# Patient Record
Sex: Female | Born: 1980 | Race: Black or African American | Hispanic: No | Smoking: Never smoker
Health system: Southern US, Community
[De-identification: ages and names within clinical notes are randomized; demographics above are authoritative.]

## PROBLEM LIST (undated history)

## (undated) DIAGNOSIS — N289 Disorder of kidney and ureter, unspecified: Secondary | ICD-10-CM

## (undated) DIAGNOSIS — E559 Vitamin D deficiency, unspecified: Secondary | ICD-10-CM

## (undated) DIAGNOSIS — I10 Essential (primary) hypertension: Secondary | ICD-10-CM

## (undated) DIAGNOSIS — I639 Cerebral infarction, unspecified: Secondary | ICD-10-CM

## (undated) DIAGNOSIS — E119 Type 2 diabetes mellitus without complications: Secondary | ICD-10-CM

## (undated) DIAGNOSIS — E785 Hyperlipidemia, unspecified: Secondary | ICD-10-CM

## (undated) HISTORY — PX: OTHER SURGICAL HISTORY: SHX169

## (undated) HISTORY — PX: COLON SURGERY: SHX602

## (undated) HISTORY — PX: TUBAL LIGATION: SHX77

## (undated) HISTORY — PX: COLOSTOMY: SHX63

---

## 2019-02-07 ENCOUNTER — Inpatient Hospital Stay
Admission: AD | Admit: 2019-02-07 | Discharge: 2019-05-03 | Disposition: A | Discharge status: Skilled Nursing Facility | Payer: Self-pay | Source: Other Acute Inpatient Hospital | Attending: Internal Medicine | Admitting: Internal Medicine

## 2019-02-07 DIAGNOSIS — Z452 Encounter for adjustment and management of vascular access device: Secondary | ICD-10-CM

## 2019-02-07 DIAGNOSIS — J189 Pneumonia, unspecified organism: Secondary | ICD-10-CM

## 2019-02-07 DIAGNOSIS — R109 Unspecified abdominal pain: Secondary | ICD-10-CM

## 2019-02-08 LAB — CBC WITH DIFFERENTIAL/PLATELET
Abs Immature Granulocytes: 0.05 10*3/uL (ref 0.00–0.07)
Basophils Absolute: 0.2 10*3/uL — ABNORMAL HIGH (ref 0.0–0.1)
Basophils Relative: 1 %
Eosinophils Absolute: 1.1 10*3/uL — ABNORMAL HIGH (ref 0.0–0.5)
Eosinophils Relative: 8 %
HCT: 30 % — ABNORMAL LOW (ref 36.0–46.0)
Hemoglobin: 10 g/dL — ABNORMAL LOW (ref 12.0–15.0)
Immature Granulocytes: 0 %
Lymphocytes Relative: 19 %
Lymphs Abs: 2.5 10*3/uL (ref 0.7–4.0)
MCH: 30.1 pg (ref 26.0–34.0)
MCHC: 33.3 g/dL (ref 30.0–36.0)
MCV: 90.4 fL (ref 80.0–100.0)
Monocytes Absolute: 0.7 10*3/uL (ref 0.1–1.0)
Monocytes Relative: 6 %
Neutro Abs: 8.4 10*3/uL — ABNORMAL HIGH (ref 1.7–7.7)
Neutrophils Relative %: 66 %
Platelets: 386 10*3/uL (ref 150–400)
RBC: 3.32 MIL/uL — ABNORMAL LOW (ref 3.87–5.11)
RDW: 18 % — ABNORMAL HIGH (ref 11.5–15.5)
WBC: 12.9 10*3/uL — ABNORMAL HIGH (ref 4.0–10.5)
nRBC: 0 % (ref 0.0–0.2)

## 2019-02-08 LAB — HEMOGLOBIN A1C
Hgb A1c MFr Bld: 6.2 % — ABNORMAL HIGH (ref 4.8–5.6)
Mean Plasma Glucose: 131.24 mg/dL

## 2019-02-08 LAB — COMPREHENSIVE METABOLIC PANEL
ALT: 16 U/L (ref 0–44)
AST: 33 U/L (ref 15–41)
Albumin: 2 g/dL — ABNORMAL LOW (ref 3.5–5.0)
Alkaline Phosphatase: 119 U/L (ref 38–126)
Anion gap: 12 (ref 5–15)
BUN: 36 mg/dL — ABNORMAL HIGH (ref 6–20)
CO2: 22 mmol/L (ref 22–32)
Calcium: 9.2 mg/dL (ref 8.9–10.3)
Chloride: 98 mmol/L (ref 98–111)
Creatinine, Ser: 4.11 mg/dL — ABNORMAL HIGH (ref 0.44–1.00)
GFR calc Af Amer: 15 mL/min — ABNORMAL LOW (ref >60)
GFR calc non Af Amer: 13 mL/min — ABNORMAL LOW (ref >60)
Glucose, Bld: 206 mg/dL — ABNORMAL HIGH (ref 70–99)
Potassium: 3.5 mmol/L (ref 3.5–5.1)
Sodium: 132 mmol/L — ABNORMAL LOW (ref 135–145)
Total Bilirubin: 0.5 mg/dL (ref 0.3–1.2)
Total Protein: 5.7 g/dL — ABNORMAL LOW (ref 6.5–8.1)

## 2019-02-08 MED ORDER — MELATONIN 3 MG PO TABS
3.00 | ORAL_TABLET | ORAL | Status: DC
Start: 2019-02-07 — End: 2019-02-08

## 2019-02-08 MED ORDER — OXYCODONE-ACETAMINOPHEN 5-325 MG PO TABS
1.00 | ORAL_TABLET | ORAL | Status: DC
Start: ? — End: 2019-02-08

## 2019-02-08 MED ORDER — LIDOCAINE 5 % EX PTCH
1.00 | MEDICATED_PATCH | CUTANEOUS | Status: DC
Start: 2019-02-07 — End: 2019-02-08

## 2019-02-08 MED ORDER — ACETAMINOPHEN 325 MG PO TABS
975.00 | ORAL_TABLET | ORAL | Status: DC
Start: 2019-02-07 — End: 2019-02-08

## 2019-02-08 MED ORDER — INSULIN LISPRO 100 UNIT/ML ~~LOC~~ SOLN
0.00 | SUBCUTANEOUS | Status: DC
Start: 2019-02-07 — End: 2019-02-08

## 2019-02-08 MED ORDER — HYDRALAZINE HCL 25 MG PO TABS
25.00 | ORAL_TABLET | ORAL | Status: DC
Start: ? — End: 2019-02-08

## 2019-02-08 MED ORDER — GLUCOSE 40 % PO GEL
ORAL | Status: DC
Start: ? — End: 2019-02-08

## 2019-02-08 MED ORDER — SENNOSIDES-DOCUSATE SODIUM 8.6-50 MG PO TABS
2.00 | ORAL_TABLET | ORAL | Status: DC
Start: 2019-02-08 — End: 2019-02-08

## 2019-02-08 MED ORDER — GENERIC EXTERNAL MEDICATION
1.00 | Status: DC
Start: 2019-02-08 — End: 2019-02-08

## 2019-02-08 MED ORDER — POLYETHYLENE GLYCOL 3350 17 G PO PACK
17.00 | PACK | ORAL | Status: DC
Start: 2019-02-08 — End: 2019-02-08

## 2019-02-08 MED ORDER — ASPIRIN EC 81 MG PO TBEC
81.00 | DELAYED_RELEASE_TABLET | ORAL | Status: DC
Start: 2019-02-08 — End: 2019-02-08

## 2019-02-08 MED ORDER — ATORVASTATIN CALCIUM 80 MG PO TABS
80.00 | ORAL_TABLET | ORAL | Status: DC
Start: 2019-02-07 — End: 2019-02-08

## 2019-02-08 MED ORDER — OXYCODONE-ACETAMINOPHEN 10-325 MG PO TABS
2.00 | ORAL_TABLET | ORAL | Status: DC
Start: ? — End: 2019-02-08

## 2019-02-08 MED ORDER — CALCIUM CARBONATE ANTACID 500 MG PO CHEW
2.00 | CHEWABLE_TABLET | ORAL | Status: DC
Start: 2019-02-07 — End: 2019-02-08

## 2019-02-08 MED ORDER — TRAMADOL HCL 50 MG PO TABS
50.00 | ORAL_TABLET | ORAL | Status: DC
Start: 2019-02-07 — End: 2019-02-08

## 2019-02-08 MED ORDER — GENERIC EXTERNAL MEDICATION
500.00 | Status: DC
Start: 2019-02-08 — End: 2019-02-08

## 2019-02-08 MED ORDER — NITROGLYCERIN 0.4 MG SL SUBL
0.40 | SUBLINGUAL_TABLET | SUBLINGUAL | Status: DC
Start: ? — End: 2019-02-08

## 2019-02-08 MED ORDER — DEXTROSE 50 % IV SOLN
50.00 | INTRAVENOUS | Status: DC
Start: ? — End: 2019-02-08

## 2019-02-08 MED ORDER — POLYVINYL ALCOHOL-POVIDONE PF 1.4-0.6 % OP SOLN
1.00 | OPHTHALMIC | Status: DC
Start: ? — End: 2019-02-08

## 2019-02-08 MED ORDER — HYDROMORPHONE HCL 1 MG/ML IJ SOLN
1.00 | INTRAMUSCULAR | Status: DC
Start: ? — End: 2019-02-08

## 2019-02-08 MED ORDER — GLUCAGON HCL (DIAGNOSTIC) 1 MG IJ SOLR
1.00 | INTRAMUSCULAR | Status: DC
Start: ? — End: 2019-02-08

## 2019-02-08 MED ORDER — FLUTICASONE PROPIONATE 50 MCG/ACT NA SUSP
2.00 | NASAL | Status: DC
Start: ? — End: 2019-02-08

## 2019-02-08 MED ORDER — ONDANSETRON HCL 4 MG/2ML IJ SOLN
4.00 | INTRAMUSCULAR | Status: DC
Start: ? — End: 2019-02-08

## 2019-02-08 MED ORDER — PROSOURCE NO CARB PO LIQD
30.00 | ORAL | Status: DC
Start: 2019-02-08 — End: 2019-02-08

## 2019-02-08 MED ORDER — SACCHAROMYCES BOULARDII 250 MG PO CAPS
250.00 | ORAL_CAPSULE | ORAL | Status: DC
Start: 2019-02-07 — End: 2019-02-08

## 2019-02-08 MED ORDER — EPOETIN ALFA-EPBX 4000 UNIT/ML IJ SOLN
4000.00 | INTRAMUSCULAR | Status: DC
Start: ? — End: 2019-02-08

## 2019-02-08 MED ORDER — INSULIN GLARGINE 100 UNIT/ML ~~LOC~~ SOLN
10.00 | SUBCUTANEOUS | Status: DC
Start: 2019-02-08 — End: 2019-02-08

## 2019-02-08 MED ORDER — HEPARIN SODIUM (PORCINE) PF 5000 UNIT/0.5ML IJ SOLN
5000.00 | INTRAMUSCULAR | Status: DC
Start: 2019-02-07 — End: 2019-02-08

## 2019-02-08 MED ORDER — MELATONIN 3 MG PO TABS
6.00 | ORAL_TABLET | ORAL | Status: DC
Start: ? — End: 2019-02-08

## 2019-02-09 LAB — RENAL FUNCTION PANEL
Albumin: 1.9 g/dL — ABNORMAL LOW (ref 3.5–5.0)
Anion gap: 11 (ref 5–15)
BUN: 51 mg/dL — ABNORMAL HIGH (ref 6–20)
CO2: 24 mmol/L (ref 22–32)
Calcium: 9.7 mg/dL (ref 8.9–10.3)
Chloride: 98 mmol/L (ref 98–111)
Creatinine, Ser: 4.41 mg/dL — ABNORMAL HIGH (ref 0.44–1.00)
GFR calc Af Amer: 14 mL/min — ABNORMAL LOW (ref >60)
GFR calc non Af Amer: 12 mL/min — ABNORMAL LOW (ref >60)
Glucose, Bld: 165 mg/dL — ABNORMAL HIGH (ref 70–99)
Phosphorus: 3.4 mg/dL (ref 2.5–4.6)
Potassium: 3.9 mmol/L (ref 3.5–5.1)
Sodium: 133 mmol/L — ABNORMAL LOW (ref 135–145)

## 2019-02-09 LAB — CBC
HCT: 29 % — ABNORMAL LOW (ref 36.0–46.0)
Hemoglobin: 9.3 g/dL — ABNORMAL LOW (ref 12.0–15.0)
MCH: 29.2 pg (ref 26.0–34.0)
MCHC: 32.1 g/dL (ref 30.0–36.0)
MCV: 91.2 fL (ref 80.0–100.0)
Platelets: 396 10*3/uL (ref 150–400)
RBC: 3.18 MIL/uL — ABNORMAL LOW (ref 3.87–5.11)
RDW: 18.3 % — ABNORMAL HIGH (ref 11.5–15.5)
WBC: 10 10*3/uL (ref 4.0–10.5)
nRBC: 0 % (ref 0.0–0.2)

## 2019-02-09 LAB — HEPATITIS B CORE ANTIBODY, IGM: Hep B C IgM: NONREACTIVE

## 2019-02-09 LAB — HEPATITIS B SURFACE ANTIGEN: Hepatitis B Surface Ag: NONREACTIVE

## 2019-02-09 NOTE — Consult Note (Signed)
[facility] CONSULT NOTE    Date: 02/09/2019                  Patient Name:  [name] Barnett  MRN: [id number]  DOB: May 19, 1980  Age / Sex: 38 y.o., female         PCP: Julianne Handler, DO                 Service Requesting Consult:  Hospitalist                 Reason for Consult:  Evaluation management of ESRD on HD            History of Present Illness: Patient is a 38 y.o. 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  Patient recently diagnosed with L4-5 spine discitis.  Patient also had a pressure ulcer as above and is status post debridement.  Patient to be continued on meropenem  Patient has been undergoing dialysis since November of last year.  She does have a right upper extremity hemodialysis access.  Patient has associated anemia of chronic kidney disease with hemoglobin of 10.0.  Patient also has history of secondary hyperparathyroidism but recently has had hypophosphatemia.  Medications:  Current medications: Meropenem 500 mg IV daily, aspirin 81 mg daily, atorvastatin 80 mg nightly, calcium carbonate 1 g 3 times daily, heparin 5000 units twice daily, Lantus insulin 10 units daily, Lidoderm patch daily, melatonin 3 mg nightly, MiraLAX 17 g daily, renal vitamin 1 tablet daily, senna 2 tablets daily, tramadol 50 mg twice daily  Allergies: Lisinopril, morphine, corn   Past Medical History: ESRD on HD in [place], chronic diastolic heart failure, coronary artery disease status post PCI, diabetes mellitus type 2, GERD, hypertension, history of CVA June 2020  with hemiparesis, expressive aphasia, recent necrotizing fasciitis with pressure ulcer, lumbar discitis, anemia of chronic kidney disease, secondary hyperparathyroidism, history of thyroid nodule, scoliosis  Past Surgical History: Pressure ulcer debridement Right upper extremity hemodialysis access placement CABG Tubal ligation Diverting colostomy  Family History: Father with history of CVA, mother with history of heart disease and heart failure, sister with family history of diabetes  Social History: No tobacco, alcohol, or illicit drug use.  Review of Systems: Review of Systems  Constitutional: Positive for malaise/fatigue. Negative for fever.  HENT: Negative for hearing loss and tinnitus.   Eyes: Negative for blurred vision and double vision.  Respiratory: Negative for cough, sputum production and shortness of breath.   Cardiovascular: Negative for chest pain, palpitations and orthopnea.  Gastrointestinal: Negative for heartburn, nausea and vomiting.  Genitourinary: Negative for dysuria and urgency.  Musculoskeletal: Positive for back pain.  Skin: Negative for itching and rash.  Neurological: Positive for focal weakness. Negative for dizziness.  Endo/Heme/Allergies: Negative for polydipsia. Does not bruise/bleed easily.  Psychiatric/Behavioral: Negative for depression. The patient is not nervous/anxious.     Vital Signs: Temperature 97.1 pulse 96 respiration 16 blood 127/55 Weight trends: There were no vitals filed for this visit.  Physical Exam: General: NAD, resting in bed  Head: Normocephalic, atraumatic.  Eyes: Anicteric, EOMI  Nose: Mucous membranes moist, not inflammed, nonerythematous.  Throat: Oropharynx nonerythematous, no  exudate appreciated.   Neck: Supple, trachea midline.  Lungs:  Normal respiratory effort. Clear to auscultation BL without crackles or wheezes.  Heart: RRR. S1 and S2 no rubs  Abdomen:  BS present, soft, NTND, ostomy present  Extremities:  No pretibial edema.  Neurologic: Awake, alert, conversant, some dysarthria noted  Skin: No rashes noted    Lab results: Basic Metabolic Panel: Recent Labs  Lab 02/08/19 1225  NA 132*  K 3.5  CL 98  CO2 22  GLUCOSE 206*  BUN 36*  CREATININE 4.11*  CALCIUM 9.2    Liver Function Tests: Recent Labs  Lab 02/08/19 1225  AST 33  ALT 16  ALKPHOS 119  BILITOT 0.5  PROT 5.7*  ALBUMIN 2.0*   No results for input(s): LIPASE, AMYLASE in the last 168 hours. No results for input(s): AMMONIA in the last 168 hours.  CBC: Recent Labs  Lab 02/08/19 1225  WBC 12.9*  NEUTROABS 8.4*  HGB 10.0*  HCT 30.0*  MCV 90.4  PLT 386    Cardiac Enzymes: No results for input(s): CKTOTAL, CKMB, CKMBINDEX, TROPONINI in the last 168 hours.  BNP: Invalid input(s): POCBNP  CBG: No results for input(s): GLUCAP in the last 168 hours.  Microbiology: No results found for this or any previous visit.  Coagulation Studies: No results for input(s): LABPROT, INR in the last 72 hours.  Urinalysis: No results for input(s): COLORURINE, LABSPEC, PHURINE, GLUCOSEU, HGBUR, BILIRUBINUR, KETONESUR, PROTEINUR, UROBILINOGEN, NITRITE, LEUKOCYTESUR in the last 72 hours.  Invalid input(s): APPERANCEUR    Imaging:  No results found.   Assessment & Plan: Pt is a 38 y.o. 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   1.  ESRD on HD.  We plan to perform hemodialysis today using her right upper extremity AV access.  We will plan for treatment of 3.5 hours, blood flow rate 400, dialysate flow rate 800, ultrafiltration target 1.5 kg.  2.   Anemia of chronic kidney disease.  Hemoglobin currently 10.0.  Hold off on reticulocyte rate for now.  3.  Secondary hyperparathyroidism.  We plan to check intact PTH, calcium, and phosphorus today.  Currently not on binder therapy.  4.  Recent necrotizing fasciitis with sacral wound.  Patient on meropenem.  Management per wound care team as well as infectious disease.

## 2019-02-10 LAB — PARATHYROID HORMONE, INTACT (NO CA): PTH: 15 pg/mL (ref 15–65)

## 2019-02-10 LAB — HEPATITIS B SURFACE ANTIBODY, QUANTITATIVE: Hep B S AB Quant (Post): 406.8 m[IU]/mL (ref >9.9)

## 2019-02-12 LAB — CBC
HCT: 26.9 % — ABNORMAL LOW (ref 36.0–46.0)
Hemoglobin: 8.6 g/dL — ABNORMAL LOW (ref 12.0–15.0)
MCH: 29.5 pg (ref 26.0–34.0)
MCHC: 32 g/dL (ref 30.0–36.0)
MCV: 92.1 fL (ref 80.0–100.0)
Platelets: 414 10*3/uL — ABNORMAL HIGH (ref 150–400)
RBC: 2.92 MIL/uL — ABNORMAL LOW (ref 3.87–5.11)
RDW: 18.4 % — ABNORMAL HIGH (ref 11.5–15.5)
WBC: 10.1 10*3/uL (ref 4.0–10.5)
nRBC: 0 % (ref 0.0–0.2)

## 2019-02-12 LAB — RENAL FUNCTION PANEL
Albumin: 1.8 g/dL — ABNORMAL LOW (ref 3.5–5.0)
Anion gap: 11 (ref 5–15)
BUN: 70 mg/dL — ABNORMAL HIGH (ref 6–20)
CO2: 25 mmol/L (ref 22–32)
Calcium: 9.4 mg/dL (ref 8.9–10.3)
Chloride: 99 mmol/L (ref 98–111)
Creatinine, Ser: 4.34 mg/dL — ABNORMAL HIGH (ref 0.44–1.00)
GFR calc Af Amer: 14 mL/min — ABNORMAL LOW (ref >60)
GFR calc non Af Amer: 12 mL/min — ABNORMAL LOW (ref >60)
Glucose, Bld: 153 mg/dL — ABNORMAL HIGH (ref 70–99)
Phosphorus: 3.3 mg/dL (ref 2.5–4.6)
Potassium: 4.9 mmol/L (ref 3.5–5.1)
Sodium: 135 mmol/L (ref 135–145)

## 2019-02-12 NOTE — Progress Notes (Signed)
[facility]  ROUNDING NOTE   Subjective:  Patient seen and evaluated at bedside. Currently awake and alert as well as conversant. Does complain of some pain in her sacral region. She will be due for hemodialysis today.   Objective:  Vital signs in last 24 hours:  Temperature 98.1 pulse 92 respiration 16 blood pressure 112/69  Physical Exam: General: No acute distress  Head: Normocephalic, atraumatic. Moist oral mucosal membranes  Eyes: Anicteric  Neck: Supple, trachea midline  Lungs:  Clear to auscultation, normal effort  Heart: S1S2 no rubs  Abdomen:  Soft, nontender, bowel sounds present, colostomy in place  Extremities: Trace peripheral edema.  Neurologic: Awake, alert, following commands  Skin: No lesions  Access: RUE AVF    Basic Metabolic Panel: Recent Labs  Lab 02/08/19 1225 02/09/19 0950 02/12/19 0500  NA 132* 133* 135  K 3.5 3.9 4.9  CL 98 98 99  CO2 22 24 25   GLUCOSE 206* 165* 153*  BUN 36* 51* 70*  CREATININE 4.11* 4.41* 4.34*  CALCIUM 9.2 9.7 9.4  PHOS  --  3.4 3.3    Liver Function Tests: Recent Labs  Lab 02/08/19 1225 02/09/19 0950 02/12/19 0500  AST 33  --   --   ALT 16  --   --   ALKPHOS 119  --   --   BILITOT 0.5  --   --   PROT 5.7*  --   --   ALBUMIN 2.0* 1.9* 1.8*   No results for input(s): LIPASE, AMYLASE in the last 168 hours. No results for input(s): AMMONIA in the last 168 hours.  CBC: Recent Labs  Lab 02/08/19 1225 02/09/19 1013 02/12/19 0500  WBC 12.9* 10.0 10.1  NEUTROABS 8.4*  --   --   HGB 10.0* 9.3* 8.6*  HCT 30.0* 29.0* 26.9*  MCV 90.4 91.2 92.1  PLT 386 396 414*    Cardiac Enzymes: No results for input(s): CKTOTAL, CKMB, CKMBINDEX, TROPONINI in the last 168 hours.  BNP: Invalid input(s): POCBNP  CBG: No results for input(s): GLUCAP in the last 168 hours.  Microbiology: No results found for this or any previous visit.  Coagulation Studies: No results for input(s): LABPROT, INR in the  last 72 hours.  Urinalysis: No results for input(s): COLORURINE, LABSPEC, PHURINE, GLUCOSEU, HGBUR, BILIRUBINUR, KETONESUR, PROTEINUR, UROBILINOGEN, NITRITE, LEUKOCYTESUR in the last 72 hours.  Invalid input(s): APPERANCEUR    Imaging: No results found.   Medications:       Assessment/ Plan:  38 y.o. 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   1.  ESRD on HD.    Patient seen and evaluated at bedside this AM.  Serum electrolytes appear to be acceptable.  She is due for hemodialysis today.  Continue dialysis on MWF schedule.  2.  Anemia of chronic kidney disease.  Hemoglobin has dropped to 8.6.  We will start the patient on Retacrit 10,000 units IV with dialysis treatments on MWF.  3.  Secondary hyperparathyroidism phosphorus currently 3.3 and acceptable.  Continue to monitor.  4.  Lumbar discitis.  Patient currently on meropenem.  Continue antibiotic therapy as per hospitalist.   LOS: 0 Coyle Stordahl 10/12/20209:21  AM

## 2019-02-14 LAB — CBC
HCT: 29.5 % — ABNORMAL LOW (ref 36.0–46.0)
Hemoglobin: 9.7 g/dL — ABNORMAL LOW (ref 12.0–15.0)
MCH: 30.1 pg (ref 26.0–34.0)
MCHC: 32.9 g/dL (ref 30.0–36.0)
MCV: 91.6 fL (ref 80.0–100.0)
Platelets: 432 10*3/uL — ABNORMAL HIGH (ref 150–400)
RBC: 3.22 MIL/uL — ABNORMAL LOW (ref 3.87–5.11)
RDW: 18.2 % — ABNORMAL HIGH (ref 11.5–15.5)
WBC: 10.3 10*3/uL (ref 4.0–10.5)
nRBC: 0 % (ref 0.0–0.2)

## 2019-02-14 LAB — RENAL FUNCTION PANEL
Albumin: 2 g/dL — ABNORMAL LOW (ref 3.5–5.0)
Anion gap: 9 (ref 5–15)
BUN: 66 mg/dL — ABNORMAL HIGH (ref 6–20)
CO2: 27 mmol/L (ref 22–32)
Calcium: 9.5 mg/dL (ref 8.9–10.3)
Chloride: 97 mmol/L — ABNORMAL LOW (ref 98–111)
Creatinine, Ser: 3.51 mg/dL — ABNORMAL HIGH (ref 0.44–1.00)
GFR calc Af Amer: 18 mL/min — ABNORMAL LOW (ref >60)
GFR calc non Af Amer: 16 mL/min — ABNORMAL LOW (ref >60)
Glucose, Bld: 207 mg/dL — ABNORMAL HIGH (ref 70–99)
Phosphorus: 3.3 mg/dL (ref 2.5–4.6)
Potassium: 5.2 mmol/L — ABNORMAL HIGH (ref 3.5–5.1)
Sodium: 133 mmol/L — ABNORMAL LOW (ref 135–145)

## 2019-02-14 NOTE — Progress Notes (Signed)
  [facility]  ROUNDING NOTE   Subjective:  Patient seen and evaluated bedside. Still having some pain in the sacral region rated 7 out of 10 at the moment. Dialysis has been ordered for today. She has been tolerating dialysis treatments well thus far.   Objective:  Vital signs in last 24 hours:  Temperature 96.5 pulse 87 respirations 14 blood pressure 121/84  Physical Exam: General: No acute distress  Head: Normocephalic, atraumatic. Moist oral mucosal membranes  Eyes: Anicteric  Neck: Supple, trachea midline  Lungs:  Clear to auscultation, normal effort  Heart: S1S2 no rubs  Abdomen:  Soft, nontender, bowel sounds present, colostomy in place  Extremities: Trace peripheral edema.  Neurologic: Awake, alert, following commands  Skin: No lesions  Access: RUE AVF    Basic Metabolic Panel: Recent Labs  Lab 02/08/19 1225 02/09/19 0950 02/12/19 0500  NA 132* 133* 135  K 3.5 3.9 4.9  CL 98 98 99  CO2 22 24 25   GLUCOSE 206* 165* 153*  BUN 36* 51* 70*  CREATININE 4.11* 4.41* 4.34*  CALCIUM 9.2 9.7 9.4  PHOS  --  3.4 3.3    Liver Function Tests: Recent Labs  Lab 02/08/19 1225 02/09/19 0950 02/12/19 0500  AST 33  --   --   ALT 16  --   --   ALKPHOS 119  --   --   BILITOT 0.5  --   --   PROT 5.7*  --   --   ALBUMIN 2.0* 1.9* 1.8*   No results for input(s): LIPASE, AMYLASE in the last 168 hours. No results for input(s): AMMONIA in the last 168 hours.  CBC: Recent Labs  Lab 02/08/19 1225 02/09/19 1013 02/12/19 0500  WBC 12.9* 10.0 10.1  NEUTROABS 8.4*  --   --   HGB 10.0* 9.3* 8.6*  HCT 30.0* 29.0* 26.9*  MCV 90.4 91.2 92.1  PLT 386 396 414*    Cardiac Enzymes: No results for input(s): CKTOTAL, CKMB, CKMBINDEX, TROPONINI in the last 168 hours.  BNP: Invalid input(s): POCBNP  CBG: No results for input(s): GLUCAP in the last 168 hours.  Microbiology: No results found for this or any previous visit.  Coagulation Studies: No results  for input(s): LABPROT, INR in the last 72 hours.  Urinalysis: No results for input(s): COLORURINE, LABSPEC, PHURINE, GLUCOSEU, HGBUR, BILIRUBINUR, KETONESUR, PROTEINUR, UROBILINOGEN, NITRITE, LEUKOCYTESUR in the last 72 hours.  Invalid input(s): APPERANCEUR    Imaging: No results found.   Medications:       Assessment/ Plan:  38 y.o. 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   1.  ESRD on HD.    Patient due for dialysis today.  Orders have been prepared.  Continue dialysis on MWF schedule.  2.  Anemia of chronic kidney disease.  Patient started on Retacrit.  Continue reticulocyte at 10,000 units IV with dialysis.  3.  Secondary hyperparathyroidism.  Reevaluate serum phosphorus today.  4.  Lumbar discitis.  Patient treated with meropenem.   LOS: 0 [name] Barnett 10/14/20209:01 AM

## 2019-02-16 LAB — CBC
HCT: 28.3 % — ABNORMAL LOW (ref 36.0–46.0)
Hemoglobin: 9.3 g/dL — ABNORMAL LOW (ref 12.0–15.0)
MCH: 30 pg (ref 26.0–34.0)
MCHC: 32.9 g/dL (ref 30.0–36.0)
MCV: 91.3 fL (ref 80.0–100.0)
Platelets: 457 10*3/uL — ABNORMAL HIGH (ref 150–400)
RBC: 3.1 MIL/uL — ABNORMAL LOW (ref 3.87–5.11)
RDW: 18 % — ABNORMAL HIGH (ref 11.5–15.5)
WBC: 12.2 10*3/uL — ABNORMAL HIGH (ref 4.0–10.5)
nRBC: 0 % (ref 0.0–0.2)

## 2019-02-16 LAB — RENAL FUNCTION PANEL
Albumin: 1.8 g/dL — ABNORMAL LOW (ref 3.5–5.0)
Anion gap: 8 (ref 5–15)
BUN: 32 mg/dL — ABNORMAL HIGH (ref 6–20)
CO2: 26 mmol/L (ref 22–32)
Calcium: 9 mg/dL (ref 8.9–10.3)
Chloride: 99 mmol/L (ref 98–111)
Creatinine, Ser: 3.15 mg/dL — ABNORMAL HIGH (ref 0.44–1.00)
GFR calc Af Amer: 21 mL/min — ABNORMAL LOW (ref >60)
GFR calc non Af Amer: 18 mL/min — ABNORMAL LOW (ref >60)
Glucose, Bld: 120 mg/dL — ABNORMAL HIGH (ref 70–99)
Phosphorus: 2.9 mg/dL (ref 2.5–4.6)
Potassium: 4.2 mmol/L (ref 3.5–5.1)
Sodium: 133 mmol/L — ABNORMAL LOW (ref 135–145)

## 2019-02-16 NOTE — Progress Notes (Signed)
  [facility]  ROUNDING NOTE   Subjective:  Patient resting comfortably in bed. Due for hemodialysis today.   Objective:  Vital signs in last 24 hours:  Temperature 97.8 pulse 91 respirations 18 blood pressure 99/61  Physical Exam: General: No acute distress  Head: Normocephalic, atraumatic. Moist oral mucosal membranes  Eyes: Anicteric  Neck: Supple, trachea midline  Lungs:  Clear to auscultation, normal effort  Heart: S1S2 no rubs  Abdomen:  Soft, nontender, bowel sounds present, colostomy in place  Extremities: Trace peripheral edema.  Neurologic: Resting comfortably at the moment  Skin: No lesions  Access: RUE AVF    Basic Metabolic Panel: Recent Labs  Lab 02/09/19 0950 02/12/19 0500 02/14/19 1200  NA 133* 135 133*  K 3.9 4.9 5.2*  CL 98 99 97*  CO2 24 25 27   GLUCOSE 165* 153* 207*  BUN 51* 70* 66*  CREATININE 4.41* 4.34* 3.51*  CALCIUM 9.7 9.4 9.5  PHOS 3.4 3.3 3.3    Liver Function Tests: Recent Labs  Lab 02/09/19 0950 02/12/19 0500 02/14/19 1200  ALBUMIN 1.9* 1.8* 2.0*   No results for input(s): LIPASE, AMYLASE in the last 168 hours. No results for input(s): AMMONIA in the last 168 hours.  CBC: Recent Labs  Lab 02/09/19 1013 02/12/19 0500 02/14/19 1200 02/16/19 0500  WBC 10.0 10.1 10.3 12.2*  HGB 9.3* 8.6* 9.7* 9.3*  HCT 29.0* 26.9* 29.5* 28.3*  MCV 91.2 92.1 91.6 91.3  PLT 396 414* 432* 457*    Cardiac Enzymes: No results for input(s): CKTOTAL, CKMB, CKMBINDEX, TROPONINI in the last 168 hours.  BNP: Invalid input(s): POCBNP  CBG: No results for input(s): GLUCAP in the last 168 hours.  Microbiology: No results found for this or any previous visit.  Coagulation Studies: No results for input(s): LABPROT, INR in the last 72 hours.  Urinalysis: No results for input(s): COLORURINE, LABSPEC, PHURINE, GLUCOSEU, HGBUR, BILIRUBINUR, KETONESUR, PROTEINUR, UROBILINOGEN, NITRITE, LEUKOCYTESUR in the last 72 hours.  Invalid  input(s): APPERANCEUR    Imaging: No results found.   Medications:       Assessment/ Plan:  38 y.o. 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   1.  ESRD on HD.    Patient due for hemodialysis later today.  Ultrafiltration target 1.5 kg.  We plan to quickly dialysis treatment today and next treatment will be on Friday.  2.  Anemia of chronic kidney disease.  Hemoglobin 9.3.  Maintain the patient on Retacrit 10,000 units IV with dialysis.  3.  Secondary hyperparathyroidism.  Serum phosphorus at target at 3.3.  Continue to monitor.  4.  Lumbar discitis.  Patient treated with meropenem.   LOS: 0 [name] Barnett 10/16/20208:06 AM

## 2019-02-19 LAB — CBC
HCT: 20.9 % — ABNORMAL LOW (ref 36.0–46.0)
Hemoglobin: 6.7 g/dL — CL (ref 12.0–15.0)
MCH: 30.5 pg (ref 26.0–34.0)
MCHC: 32.1 g/dL (ref 30.0–36.0)
MCV: 95 fL (ref 80.0–100.0)
Platelets: 543 10*3/uL — ABNORMAL HIGH (ref 150–400)
RBC: 2.2 MIL/uL — ABNORMAL LOW (ref 3.87–5.11)
RDW: 18.2 % — ABNORMAL HIGH (ref 11.5–15.5)
WBC: 10.9 10*3/uL — ABNORMAL HIGH (ref 4.0–10.5)
nRBC: 0 % (ref 0.0–0.2)

## 2019-02-19 LAB — RENAL FUNCTION PANEL
Albumin: 1.9 g/dL — ABNORMAL LOW (ref 3.5–5.0)
Anion gap: 9 (ref 5–15)
BUN: 78 mg/dL — ABNORMAL HIGH (ref 6–20)
CO2: 28 mmol/L (ref 22–32)
Calcium: 9.8 mg/dL (ref 8.9–10.3)
Chloride: 98 mmol/L (ref 98–111)
Creatinine, Ser: 4.88 mg/dL — ABNORMAL HIGH (ref 0.44–1.00)
GFR calc Af Amer: 12 mL/min — ABNORMAL LOW (ref >60)
GFR calc non Af Amer: 10 mL/min — ABNORMAL LOW (ref >60)
Glucose, Bld: 114 mg/dL — ABNORMAL HIGH (ref 70–99)
Phosphorus: 3.3 mg/dL (ref 2.5–4.6)
Potassium: 4.8 mmol/L (ref 3.5–5.1)
Sodium: 135 mmol/L (ref 135–145)

## 2019-02-19 LAB — PREPARE RBC (CROSSMATCH)

## 2019-02-19 NOTE — Progress Notes (Signed)
  [facility]  ROUNDING NOTE   Subjective:  Patient seen and evaluated during hemodialysis. Tolerating well. No acute complaints at the moment.   Objective:  Vital signs in last 24 hours:  Temperature 97.4 pulse 93 respirations 19 blood pressure 111/68  Physical Exam: General: No acute distress  Head: Normocephalic, atraumatic. Moist oral mucosal membranes  Eyes: Anicteric  Neck: Supple, trachea midline  Lungs:  Clear to auscultation, normal effort  Heart: S1S2 no rubs  Abdomen:  Soft, nontender, bowel sounds present, colostomy in place  Extremities: Trace peripheral edema.  Neurologic: Resting comfortably at the moment  Skin: No lesions  Access: RUE AVF    Basic Metabolic Panel: Recent Labs  Lab 02/14/19 1200 02/16/19 0500  NA 133* 133*  K 5.2* 4.2  CL 97* 99  CO2 27 26  GLUCOSE 207* 120*  BUN 66* 32*  CREATININE 3.51* 3.15*  CALCIUM 9.5 9.0  PHOS 3.3 2.9    Liver Function Tests: Recent Labs  Lab 02/14/19 1200 02/16/19 0500  ALBUMIN 2.0* 1.8*   No results for input(s): LIPASE, AMYLASE in the last 168 hours. No results for input(s): AMMONIA in the last 168 hours.  CBC: Recent Labs  Lab 02/14/19 1200 02/16/19 0500  WBC 10.3 12.2*  HGB 9.7* 9.3*  HCT 29.5* 28.3*  MCV 91.6 91.3  PLT 432* 457*    Cardiac Enzymes: No results for input(s): CKTOTAL, CKMB, CKMBINDEX, TROPONINI in the last 168 hours.  BNP: Invalid input(s): POCBNP  CBG: No results for input(s): GLUCAP in the last 168 hours.  Microbiology: No results found for this or any previous visit.  Coagulation Studies: No results for input(s): LABPROT, INR in the last 72 hours.  Urinalysis: No results for input(s): COLORURINE, LABSPEC, PHURINE, GLUCOSEU, HGBUR, BILIRUBINUR, KETONESUR, PROTEINUR, UROBILINOGEN, NITRITE, LEUKOCYTESUR in the last 72 hours.  Invalid input(s): APPERANCEUR    Imaging: No results found.   Medications:       Assessment/ Plan:  38 y.o.  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   1.  ESRD on HD.    Patient seen and evaluated during dialysis treatment.  Tolerating well.  We plan to complete dialysis treatment today and next dialysis treatment will be on Wednesday.  2.  Anemia of chronic kidney disease.  Hemoglobin 9.3 at last check.  Recommend repeat CBC.  Otherwise maintain the patient on Retacrit.  3.  Secondary hyperparathyroidism.  Phosphorus acceptable at 2.9.  4.  Lumbar discitis.  Patient treated with meropenem.   LOS: 0 Mishawn Didion 10/19/20208:39 AM

## 2019-02-20 LAB — BPAM RBC
Blood Product Expiration Date: 202011172359
ISSUE DATE / TIME: 202010192336
Unit Type and Rh: 7300

## 2019-02-20 LAB — TYPE AND SCREEN
ABO/RH(D): B POS
Antibody Screen: NEGATIVE
Unit division: 0

## 2019-02-20 LAB — ABO/RH: ABO/RH(D): B POS

## 2019-02-20 LAB — OCCULT BLOOD X 1 CARD TO LAB, STOOL: Fecal Occult Bld: NEGATIVE

## 2019-02-20 LAB — HEMOGLOBIN AND HEMATOCRIT, BLOOD
HCT: 35.7 % — ABNORMAL LOW (ref 36.0–46.0)
Hemoglobin: 11.9 g/dL — ABNORMAL LOW (ref 12.0–15.0)

## 2019-02-21 LAB — RENAL FUNCTION PANEL
Albumin: 1.9 g/dL — ABNORMAL LOW (ref 3.5–5.0)
Anion gap: 11 (ref 5–15)
BUN: 69 mg/dL — ABNORMAL HIGH (ref 6–20)
CO2: 25 mmol/L (ref 22–32)
Calcium: 10.1 mg/dL (ref 8.9–10.3)
Chloride: 99 mmol/L (ref 98–111)
Creatinine, Ser: 4.49 mg/dL — ABNORMAL HIGH (ref 0.44–1.00)
GFR calc Af Amer: 13 mL/min — ABNORMAL LOW (ref >60)
GFR calc non Af Amer: 12 mL/min — ABNORMAL LOW (ref >60)
Glucose, Bld: 63 mg/dL — ABNORMAL LOW (ref 70–99)
Phosphorus: 3.3 mg/dL (ref 2.5–4.6)
Potassium: 4.8 mmol/L (ref 3.5–5.1)
Sodium: 135 mmol/L (ref 135–145)

## 2019-02-21 LAB — CBC
HCT: 33.6 % — ABNORMAL LOW (ref 36.0–46.0)
Hemoglobin: 10.6 g/dL — ABNORMAL LOW (ref 12.0–15.0)
MCH: 29.5 pg (ref 26.0–34.0)
MCHC: 31.5 g/dL (ref 30.0–36.0)
MCV: 93.6 fL (ref 80.0–100.0)
Platelets: 436 10*3/uL — ABNORMAL HIGH (ref 150–400)
RBC: 3.59 MIL/uL — ABNORMAL LOW (ref 3.87–5.11)
RDW: 17.2 % — ABNORMAL HIGH (ref 11.5–15.5)
WBC: 11.6 10*3/uL — ABNORMAL HIGH (ref 4.0–10.5)
nRBC: 0 % (ref 0.0–0.2)

## 2019-02-21 NOTE — Progress Notes (Signed)
  [facility]  ROUNDING NOTE   Subjective:  Patient complaining of pain in her sacral area. Due for dialysis today.   Objective:  Vital signs in last 24 hours:  Temperature 96.3 pulse 87 respirations 14 blood pressure 97/67  Physical Exam: General: No acute distress  Head: Normocephalic, atraumatic. Moist oral mucosal membranes  Eyes: Anicteric  Neck: Supple, trachea midline  Lungs:  Clear to auscultation, normal effort  Heart: S1S2 no rubs  Abdomen:  Soft, nontender, bowel sounds present, colostomy in place  Extremities: Trace peripheral edema.  Neurologic: Resting comfortably at the moment  Skin: No lesions  Access: RUE AVF    Basic Metabolic Panel: Recent Labs  Lab 02/14/19 1200 02/16/19 0500 02/19/19 1000  NA 133* 133* 135  K 5.2* 4.2 4.8  CL 97* 99 98  CO2 27 26 28   GLUCOSE 207* 120* 114*  BUN 66* 32* 78*  CREATININE 3.51* 3.15* 4.88*  CALCIUM 9.5 9.0 9.8  PHOS 3.3 2.9 3.3    Liver Function Tests: Recent Labs  Lab 02/14/19 1200 02/16/19 0500 02/19/19 1000  ALBUMIN 2.0* 1.8* 1.9*   No results for input(s): LIPASE, AMYLASE in the last 168 hours. No results for input(s): AMMONIA in the last 168 hours.  CBC: Recent Labs  Lab 02/14/19 1200 02/16/19 0500 02/19/19 1000 02/20/19 1845 02/21/19 0803  WBC 10.3 12.2* 10.9*  --  11.6*  HGB 9.7* 9.3* 6.7* 11.9* 10.6*  HCT 29.5* 28.3* 20.9* 35.7* 33.6*  MCV 91.6 91.3 95.0  --  93.6  PLT 432* 457* 543*  --  436*    Cardiac Enzymes: No results for input(s): CKTOTAL, CKMB, CKMBINDEX, TROPONINI in the last 168 hours.  BNP: Invalid input(s): POCBNP  CBG: No results for input(s): GLUCAP in the last 168 hours.  Microbiology: No results found for this or any previous visit.  Coagulation Studies: No results for input(s): LABPROT, INR in the last 72 hours.  Urinalysis: No results for input(s): COLORURINE, LABSPEC, PHURINE, GLUCOSEU, HGBUR, BILIRUBINUR, KETONESUR, PROTEINUR, UROBILINOGEN,  NITRITE, LEUKOCYTESUR in the last 72 hours.  Invalid input(s): APPERANCEUR    Imaging: No results found.   Medications:       Assessment/ Plan:  38 y.o. 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   1.  ESRD on HD.    Patient due for dialysis today.  Orders have been prepared.  2.  Anemia of chronic kidney disease.  Hemoglobin was down to 6.7.  Patient subsequently received blood transfusion and hemoglobin now 10.6.  3.  Secondary hyperparathyroidism.  Phosphorus target at 3.3.  4.  Lumbar discitis.  Patient treated with meropenem.   LOS: 0 Virgene Tirone 10/21/20208:29 AM

## 2019-02-23 NOTE — Progress Notes (Signed)
  [facility]  ROUNDING NOTE   Subjective:  Patient seen and evaluated at bedside. Resting comfortably at the moment.  Objective:  Vital signs in last 24 hours:  Temperature 96.7 pulse 95 respiration 17 blood pressure 106/75  Physical Exam: General: No acute distress  Head: Normocephalic, atraumatic. Moist oral mucosal membranes  Eyes: Anicteric  Neck: Supple, trachea midline  Lungs:  Clear to auscultation, normal effort  Heart: S1S2 no rubs  Abdomen:  Soft, nontender, bowel sounds present, colostomy in place  Extremities: Trace peripheral edema.  Neurologic: Resting comfortably at the moment  Skin: No lesions  Access: RUE AVF    Basic Metabolic Panel: Recent Labs  Lab 02/19/19 1000 02/21/19 0804  NA 135 135  K 4.8 4.8  CL 98 99  CO2 28 25  GLUCOSE 114* 63*  BUN 78* 69*  CREATININE 4.88* 4.49*  CALCIUM 9.8 10.1  PHOS 3.3 3.3    Liver Function Tests: Recent Labs  Lab 02/19/19 1000 02/21/19 0804  ALBUMIN 1.9* 1.9*   No results for input(s): LIPASE, AMYLASE in the last 168 hours. No results for input(s): AMMONIA in the last 168 hours.  CBC: Recent Labs  Lab 02/19/19 1000 02/20/19 1845 02/21/19 0803  WBC 10.9*  --  11.6*  HGB 6.7* 11.9* 10.6*  HCT 20.9* 35.7* 33.6*  MCV 95.0  --  93.6  PLT 543*  --  436*    Cardiac Enzymes: No results for input(s): CKTOTAL, CKMB, CKMBINDEX, TROPONINI in the last 168 hours.  BNP: Invalid input(s): POCBNP  CBG: No results for input(s): GLUCAP in the last 168 hours.  Microbiology: No results found for this or any previous visit.  Coagulation Studies: No results for input(s): LABPROT, INR in the last 72 hours.  Urinalysis: No results for input(s): COLORURINE, LABSPEC, PHURINE, GLUCOSEU, HGBUR, BILIRUBINUR, KETONESUR, PROTEINUR, UROBILINOGEN, NITRITE, LEUKOCYTESUR in the last 72 hours.  Invalid input(s): APPERANCEUR    Imaging: No results found.   Medications:       Assessment/ Plan:   38 y.o. 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   1.  ESRD on HD.    Patient will be maintained on dialysis on MWF schedule.  We plan to complete dialysis treatment today and plan for dialysis again on Monday thereafter.  2.  Anemia of chronic kidney disease.  Hemoglobin currently 10.6.  Continue to monitor.  3.  Secondary hyperparathyroidism.  Recheck serum phosphorus today.  4.  Lumbar discitis.  Patient treated with meropenem.   LOS: 0 [name] Barnett 10/23/20208:24 AM

## 2019-02-26 LAB — RENAL FUNCTION PANEL
Albumin: 2.2 g/dL — ABNORMAL LOW (ref 3.5–5.0)
Anion gap: 13 (ref 5–15)
BUN: 41 mg/dL — ABNORMAL HIGH (ref 6–20)
CO2: 25 mmol/L (ref 22–32)
Calcium: 10.1 mg/dL (ref 8.9–10.3)
Chloride: 97 mmol/L — ABNORMAL LOW (ref 98–111)
Creatinine, Ser: 5.43 mg/dL — ABNORMAL HIGH (ref 0.44–1.00)
GFR calc Af Amer: 11 mL/min — ABNORMAL LOW (ref >60)
GFR calc non Af Amer: 9 mL/min — ABNORMAL LOW (ref >60)
Glucose, Bld: 176 mg/dL — ABNORMAL HIGH (ref 70–99)
Phosphorus: 4.2 mg/dL (ref 2.5–4.6)
Potassium: 3.8 mmol/L (ref 3.5–5.1)
Sodium: 135 mmol/L (ref 135–145)

## 2019-02-26 LAB — CBC
HCT: 36.3 % (ref 36.0–46.0)
Hemoglobin: 11.4 g/dL — ABNORMAL LOW (ref 12.0–15.0)
MCH: 29.2 pg (ref 26.0–34.0)
MCHC: 31.4 g/dL (ref 30.0–36.0)
MCV: 93.1 fL (ref 80.0–100.0)
Platelets: 354 10*3/uL (ref 150–400)
RBC: 3.9 MIL/uL (ref 3.87–5.11)
RDW: 15.6 % — ABNORMAL HIGH (ref 11.5–15.5)
WBC: 13 10*3/uL — ABNORMAL HIGH (ref 4.0–10.5)
nRBC: 0 % (ref 0.0–0.2)

## 2019-02-26 NOTE — Progress Notes (Signed)
  [facility]  ROUNDING NOTE   Subjective:  Patient in good spirits today as it is her birthday. Due for hemodialysis today. Resting comfortably..  Objective:  Vital signs in last 24 hours:  Temperature 97.3 pulse 82 respirations 18 blood pressure 99/61  Physical Exam: General: No acute distress  Head: Normocephalic, atraumatic. Moist oral mucosal membranes  Eyes: Anicteric  Neck: Supple, trachea midline  Lungs:  Clear to auscultation, normal effort  Heart: S1S2 no rubs  Abdomen:  Soft, nontender, bowel sounds present, colostomy in place  Extremities: Trace peripheral edema.  Neurologic: Awake, alert, conversant  Skin: No rashes  Access: RUE AVF    Basic Metabolic Panel: Recent Labs  Lab 02/19/19 1000 02/21/19 0804  NA 135 135  K 4.8 4.8  CL 98 99  CO2 28 25  GLUCOSE 114* 63*  BUN 78* 69*  CREATININE 4.88* 4.49*  CALCIUM 9.8 10.1  PHOS 3.3 3.3    Liver Function Tests: Recent Labs  Lab 02/19/19 1000 02/21/19 0804  ALBUMIN 1.9* 1.9*   No results for input(s): LIPASE, AMYLASE in the last 168 hours. No results for input(s): AMMONIA in the last 168 hours.  CBC: Recent Labs  Lab 02/19/19 1000 02/20/19 1845 02/21/19 0803 02/26/19 0500  WBC 10.9*  --  11.6* 13.0*  HGB 6.7* 11.9* 10.6* 11.4*  HCT 20.9* 35.7* 33.6* 36.3  MCV 95.0  --  93.6 93.1  PLT 543*  --  436* 354    Cardiac Enzymes: No results for input(s): CKTOTAL, CKMB, CKMBINDEX, TROPONINI in the last 168 hours.  BNP: Invalid input(s): POCBNP  CBG: No results for input(s): GLUCAP in the last 168 hours.  Microbiology: No results found for this or any previous visit.  Coagulation Studies: No results for input(s): LABPROT, INR in the last 72 hours.  Urinalysis: No results for input(s): COLORURINE, LABSPEC, PHURINE, GLUCOSEU, HGBUR, BILIRUBINUR, KETONESUR, PROTEINUR, UROBILINOGEN, NITRITE, LEUKOCYTESUR in the last 72 hours.  Invalid input(s): APPERANCEUR    Imaging: No  results found.   Medications:       Assessment/ Plan:  38 y.o. 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   1.  ESRD on HD.    Patient due for dialysis today.  Orders have been prepared.  2.  Anemia of chronic kidney disease.  Hemoglobin up to 11.4.  Continue to monitor.  3.  Secondary hyperparathyroidism.  Phosphorus 3.3 and acceptable at last check.  Repeat serum phosphorus today.  4.  Lumbar discitis.  Patient treated with meropenem.   LOS: 0 Rees Matura 10/26/20208:50 AM

## 2019-02-28 LAB — CBC
HCT: 39.6 % (ref 36.0–46.0)
Hemoglobin: 12.5 g/dL (ref 12.0–15.0)
MCH: 29 pg (ref 26.0–34.0)
MCHC: 31.6 g/dL (ref 30.0–36.0)
MCV: 91.9 fL (ref 80.0–100.0)
Platelets: 320 10*3/uL (ref 150–400)
RBC: 4.31 MIL/uL (ref 3.87–5.11)
RDW: 15.1 % (ref 11.5–15.5)
WBC: 13.9 10*3/uL — ABNORMAL HIGH (ref 4.0–10.5)
nRBC: 0 % (ref 0.0–0.2)

## 2019-02-28 LAB — RENAL FUNCTION PANEL
Albumin: 2.3 g/dL — ABNORMAL LOW (ref 3.5–5.0)
Anion gap: 14 (ref 5–15)
BUN: 29 mg/dL — ABNORMAL HIGH (ref 6–20)
CO2: 24 mmol/L (ref 22–32)
Calcium: 9.8 mg/dL (ref 8.9–10.3)
Chloride: 96 mmol/L — ABNORMAL LOW (ref 98–111)
Creatinine, Ser: 3.01 mg/dL — ABNORMAL HIGH (ref 0.44–1.00)
GFR calc Af Amer: 22 mL/min — ABNORMAL LOW (ref >60)
GFR calc non Af Amer: 19 mL/min — ABNORMAL LOW (ref >60)
Glucose, Bld: 97 mg/dL (ref 70–99)
Phosphorus: 2.2 mg/dL — ABNORMAL LOW (ref 2.5–4.6)
Potassium: 4.3 mmol/L (ref 3.5–5.1)
Sodium: 134 mmol/L — ABNORMAL LOW (ref 135–145)

## 2019-02-28 NOTE — Progress Notes (Signed)
  [facility]  ROUNDING NOTE   Subjective:  Patient seen at bedside today. Resting comfortably in bed. Due for dialysis today.  Objective:  Vital signs in last 24 hours:  Temperature 97.7 pulse 102 respirations 30 blood pressure 103/68  Physical Exam: General: No acute distress  Head: Normocephalic, atraumatic. Moist oral mucosal membranes  Eyes: Anicteric  Neck: Supple, trachea midline  Lungs:  Clear to auscultation, normal effort  Heart: S1S2 no rubs  Abdomen:  Soft, nontender, bowel sounds present, colostomy in place  Extremities: Trace peripheral edema.  Neurologic: Awake, alert, conversant  Skin: No rashes  Access: RUE AVF    Basic Metabolic Panel: Recent Labs  Lab 02/26/19 0500  NA 135  K 3.8  CL 97*  CO2 25  GLUCOSE 176*  BUN 41*  CREATININE 5.43*  CALCIUM 10.1  PHOS 4.2    Liver Function Tests: Recent Labs  Lab 02/26/19 0500  ALBUMIN 2.2*   No results for input(s): LIPASE, AMYLASE in the last 168 hours. No results for input(s): AMMONIA in the last 168 hours.  CBC: Recent Labs  Lab 02/26/19 0500  WBC 13.0*  HGB 11.4*  HCT 36.3  MCV 93.1  PLT 354    Cardiac Enzymes: No results for input(s): CKTOTAL, CKMB, CKMBINDEX, TROPONINI in the last 168 hours.  BNP: Invalid input(s): POCBNP  CBG: No results for input(s): GLUCAP in the last 168 hours.  Microbiology: No results found for this or any previous visit.  Coagulation Studies: No results for input(s): LABPROT, INR in the last 72 hours.  Urinalysis: No results for input(s): COLORURINE, LABSPEC, PHURINE, GLUCOSEU, HGBUR, BILIRUBINUR, KETONESUR, PROTEINUR, UROBILINOGEN, NITRITE, LEUKOCYTESUR in the last 72 hours.  Invalid input(s): APPERANCEUR    Imaging: No results found.   Medications:       Assessment/ Plan:  38 y.o. 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   1.  ESRD on HD.    We will maintain the patient on MWF dialysis schedule.  She is due for dialysis treatment today.  2.  Anemia of chronic kidney disease.  Most recent hemoglobin was 11.4.  Continue to monitor for now.  3.  Secondary hyperparathyroidism.  Phosphorus currently 4.2 and acceptable.  Repeat serum phosphorus today.  4.  Lumbar discitis.  Patient treated with meropenem.   LOS: 0 Mylee Falin 10/28/202010:47 AM

## 2019-03-02 LAB — RENAL FUNCTION PANEL
Albumin: 2.5 g/dL — ABNORMAL LOW (ref 3.5–5.0)
Anion gap: 23 — ABNORMAL HIGH (ref 5–15)
BUN: 51 mg/dL — ABNORMAL HIGH (ref 6–20)
CO2: 20 mmol/L — ABNORMAL LOW (ref 22–32)
Calcium: 10.4 mg/dL — ABNORMAL HIGH (ref 8.9–10.3)
Chloride: 92 mmol/L — ABNORMAL LOW (ref 98–111)
Creatinine, Ser: 4.93 mg/dL — ABNORMAL HIGH (ref 0.44–1.00)
GFR calc Af Amer: 12 mL/min — ABNORMAL LOW (ref >60)
GFR calc non Af Amer: 10 mL/min — ABNORMAL LOW (ref >60)
Glucose, Bld: 187 mg/dL — ABNORMAL HIGH (ref 70–99)
Phosphorus: 5.1 mg/dL — ABNORMAL HIGH (ref 2.5–4.6)
Potassium: 5 mmol/L (ref 3.5–5.1)
Sodium: 135 mmol/L (ref 135–145)

## 2019-03-02 LAB — CBC
HCT: 37 % (ref 36.0–46.0)
Hemoglobin: 11.8 g/dL — ABNORMAL LOW (ref 12.0–15.0)
MCH: 29.2 pg (ref 26.0–34.0)
MCHC: 31.9 g/dL (ref 30.0–36.0)
MCV: 91.6 fL (ref 80.0–100.0)
Platelets: 411 10*3/uL — ABNORMAL HIGH (ref 150–400)
RBC: 4.04 MIL/uL (ref 3.87–5.11)
RDW: 14.8 % (ref 11.5–15.5)
WBC: 13.8 10*3/uL — ABNORMAL HIGH (ref 4.0–10.5)
nRBC: 0 % (ref 0.0–0.2)

## 2019-03-02 NOTE — Progress Notes (Signed)
  [facility]  ROUNDING NOTE   Subjective:  Patient seen during dialysis treatment today.   Not tolerating as well as before. Tachycardic with heart rate into the 130s. While evaluation we instructed dialysate temperature to be reduced to 35.5 and to reduce blood flow rate to 300. We also administered metoprolol 2.5 mg IV x1.  Objective:  Vital signs in last 24 hours:  Temperature 97.6 pulse 130 respirations 18 blood pressure 110/67  Physical Exam: General: Mild distress from tachycardia  Head: Normocephalic, atraumatic. Moist oral mucosal membranes  Eyes: Anicteric  Neck: Supple, trachea midline  Lungs:  Clear to auscultation, normal effort  Heart: S1S2 tachycardic  Abdomen:  Soft, nontender, bowel sounds present, colostomy in place  Extremities: Trace peripheral edema.  Neurologic: Awake, alert, conversant  Skin: No rashes  Access: RUE AVF    Basic Metabolic Panel: Recent Labs  Lab 02/26/19 0500 02/28/19 1240  NA 135 134*  K 3.8 4.3  CL 97* 96*  CO2 25 24  GLUCOSE 176* 97  BUN 41* 29*  CREATININE 5.43* 3.01*  CALCIUM 10.1 9.8  PHOS 4.2 2.2*    Liver Function Tests: Recent Labs  Lab 02/26/19 0500 02/28/19 1240  ALBUMIN 2.2* 2.3*   No results for input(s): LIPASE, AMYLASE in the last 168 hours. No results for input(s): AMMONIA in the last 168 hours.  CBC: Recent Labs  Lab 02/26/19 0500 02/28/19 1240  WBC 13.0* 13.9*  HGB 11.4* 12.5  HCT 36.3 39.6  MCV 93.1 91.9  PLT 354 320    Cardiac Enzymes: No results for input(s): CKTOTAL, CKMB, CKMBINDEX, TROPONINI in the last 168 hours.  BNP: Invalid input(s): POCBNP  CBG: No results for input(s): GLUCAP in the last 168 hours.  Microbiology: No results found for this or any previous visit.  Coagulation Studies: No results for input(s): LABPROT, INR in the last 72 hours.  Urinalysis: No results for input(s): COLORURINE, LABSPEC, PHURINE, GLUCOSEU, HGBUR, BILIRUBINUR, KETONESUR,  PROTEINUR, UROBILINOGEN, NITRITE, LEUKOCYTESUR in the last 72 hours.  Invalid input(s): APPERANCEUR    Imaging: No results found.   Medications:       Assessment/ Plan:  38 y.o. 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   1.  ESRD on HD.    Patient seen during dialysis treatment today.  Not tolerating as well as before.  Noted to be tachycardic.  Blood pressure 110/67.  Blood flow rate lowered to 300.  Dialysate temperature lowered to 35.5.  In addition we administered metoprolol 2.5 mg IV x1.  We will continue to monitor closely during dialysis treatment.  2.  Anemia of chronic kidney disease.  Hemoglobin now up to 12.5.  Discontinue Retacrit.  3.  Secondary hyperparathyroidism.  Phosphorus 2.2.  Continue to monitor.  4.  Lumbar discitis.  Patient treated with meropenem.   LOS: 0 [name] Barnett 10/30/20208:43 AM

## 2019-03-05 LAB — RENAL FUNCTION PANEL
Albumin: 2.4 g/dL — ABNORMAL LOW (ref 3.5–5.0)
Anion gap: 16 — ABNORMAL HIGH (ref 5–15)
BUN: 49 mg/dL — ABNORMAL HIGH (ref 6–20)
CO2: 25 mmol/L (ref 22–32)
Calcium: 10 mg/dL (ref 8.9–10.3)
Chloride: 92 mmol/L — ABNORMAL LOW (ref 98–111)
Creatinine, Ser: 6.47 mg/dL — ABNORMAL HIGH (ref 0.44–1.00)
GFR calc Af Amer: 9 mL/min — ABNORMAL LOW (ref >60)
GFR calc non Af Amer: 7 mL/min — ABNORMAL LOW (ref >60)
Glucose, Bld: 185 mg/dL — ABNORMAL HIGH (ref 70–99)
Phosphorus: 3.6 mg/dL (ref 2.5–4.6)
Potassium: 4.7 mmol/L (ref 3.5–5.1)
Sodium: 133 mmol/L — ABNORMAL LOW (ref 135–145)

## 2019-03-05 LAB — CBC
HCT: 36.8 % (ref 36.0–46.0)
Hemoglobin: 11.5 g/dL — ABNORMAL LOW (ref 12.0–15.0)
MCH: 28.5 pg (ref 26.0–34.0)
MCHC: 31.3 g/dL (ref 30.0–36.0)
MCV: 91.3 fL (ref 80.0–100.0)
Platelets: 392 10*3/uL (ref 150–400)
RBC: 4.03 MIL/uL (ref 3.87–5.11)
RDW: 14.5 % (ref 11.5–15.5)
WBC: 15.1 10*3/uL — ABNORMAL HIGH (ref 4.0–10.5)
nRBC: 0 % (ref 0.0–0.2)

## 2019-03-05 NOTE — Progress Notes (Signed)
  [facility]  ROUNDING NOTE   Subjective:  Patient seen and evaluated during dialysis treatment today. Tolerating better today. Still tachycardic but does not appear to be in any distress.  Objective:  Vital signs in last 24 hours:  Temperature 96.6 pulse 125 respirations 24 blood pressure 123/97  Physical Exam: General: No acute distress  Head: Normocephalic, atraumatic. Moist oral mucosal membranes  Eyes: Anicteric  Neck: Supple, trachea midline  Lungs:  Clear to auscultation, normal effort  Heart: S1S2 tachycardic  Abdomen:  Soft, nontender, bowel sounds present, colostomy in place  Extremities: Trace peripheral edema.  Neurologic: Awake, alert, conversant  Skin: No rashes  Access: RUE AVF    Basic Metabolic Panel: Recent Labs  Lab 02/28/19 1240 03/02/19 0500 03/05/19 0500  NA 134* 135 133*  K 4.3 5.0 4.7  CL 96* 92* 92*  CO2 24 20* 25  GLUCOSE 97 187* 185*  BUN 29* 51* 49*  CREATININE 3.01* 4.93* 6.47*  CALCIUM 9.8 10.4* 10.0  PHOS 2.2* 5.1* 3.6    Liver Function Tests: Recent Labs  Lab 02/28/19 1240 03/02/19 0500 03/05/19 0500  ALBUMIN 2.3* 2.5* 2.4*   No results for input(s): LIPASE, AMYLASE in the last 168 hours. No results for input(s): AMMONIA in the last 168 hours.  CBC: Recent Labs  Lab 02/28/19 1240 03/02/19 0500 03/05/19 0500  WBC 13.9* 13.8* 15.1*  HGB 12.5 11.8* 11.5*  HCT 39.6 37.0 36.8  MCV 91.9 91.6 91.3  PLT 320 411* 392    Cardiac Enzymes: No results for input(s): CKTOTAL, CKMB, CKMBINDEX, TROPONINI in the last 168 hours.  BNP: Invalid input(s): POCBNP  CBG: No results for input(s): GLUCAP in the last 168 hours.  Microbiology: No results found for this or any previous visit.  Coagulation Studies: No results for input(s): LABPROT, INR in the last 72 hours.  Urinalysis: No results for input(s): COLORURINE, LABSPEC, PHURINE, GLUCOSEU, HGBUR, BILIRUBINUR, KETONESUR, PROTEINUR, UROBILINOGEN, NITRITE,  LEUKOCYTESUR in the last 72 hours.  Invalid input(s): APPERANCEUR    Imaging: No results found.   Medications:       Assessment/ Plan:  38 y.o. 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   1.  ESRD on HD.    Patient seen during dialysis treatment today.  Tolerating better today.  Still a bit tachycardic though does not appear to be symptomatic from this.  Heart rate in the 120s.  Ultrafiltration target 2 kg today.  Continue to monitor.  2.  Anemia of chronic kidney disease.  Hemoglobin down to 11.5.  Taken off of Retacrit.  Continue to monitor.  3.  Secondary hyperparathyroidism.  Phosphorus currently 3.6 and at target.  4.  Lumbar discitis.  Patient treated with meropenem.   LOS: 0 [name] Barnett 11/2/20208:41 AM

## 2019-03-07 ENCOUNTER — Institutional Professional Consult (permissible substitution) (HOSPITAL_COMMUNITY): Payer: Self-pay

## 2019-03-07 ENCOUNTER — Other Ambulatory Visit (HOSPITAL_COMMUNITY): Payer: Self-pay

## 2019-03-07 LAB — CBC WITH DIFFERENTIAL/PLATELET
Abs Immature Granulocytes: 0.15 10*3/uL — ABNORMAL HIGH (ref 0.00–0.07)
Basophils Absolute: 0.1 10*3/uL (ref 0.0–0.1)
Basophils Relative: 1 %
Eosinophils Absolute: 0.3 10*3/uL (ref 0.0–0.5)
Eosinophils Relative: 1 %
HCT: 35.1 % — ABNORMAL LOW (ref 36.0–46.0)
Hemoglobin: 11.3 g/dL — ABNORMAL LOW (ref 12.0–15.0)
Immature Granulocytes: 1 %
Lymphocytes Relative: 9 %
Lymphs Abs: 2.1 10*3/uL (ref 0.7–4.0)
MCH: 28.6 pg (ref 26.0–34.0)
MCHC: 32.2 g/dL (ref 30.0–36.0)
MCV: 88.9 fL (ref 80.0–100.0)
Monocytes Absolute: 1.7 10*3/uL — ABNORMAL HIGH (ref 0.1–1.0)
Monocytes Relative: 7 %
Neutro Abs: 19.7 10*3/uL — ABNORMAL HIGH (ref 1.7–7.7)
Neutrophils Relative %: 81 %
Platelets: 286 10*3/uL (ref 150–400)
RBC: 3.95 MIL/uL (ref 3.87–5.11)
RDW: 14.6 % (ref 11.5–15.5)
WBC: 24.1 10*3/uL — ABNORMAL HIGH (ref 4.0–10.5)
nRBC: 0 % (ref 0.0–0.2)

## 2019-03-07 LAB — RENAL FUNCTION PANEL
Albumin: 2.3 g/dL — ABNORMAL LOW (ref 3.5–5.0)
Anion gap: 15 (ref 5–15)
BUN: 20 mg/dL (ref 6–20)
CO2: 24 mmol/L (ref 22–32)
Calcium: 9.4 mg/dL (ref 8.9–10.3)
Chloride: 96 mmol/L — ABNORMAL LOW (ref 98–111)
Creatinine, Ser: 2.21 mg/dL — ABNORMAL HIGH (ref 0.44–1.00)
GFR calc Af Amer: 31 mL/min — ABNORMAL LOW (ref >60)
GFR calc non Af Amer: 27 mL/min — ABNORMAL LOW (ref >60)
Glucose, Bld: 133 mg/dL — ABNORMAL HIGH (ref 70–99)
Phosphorus: 1.3 mg/dL — ABNORMAL LOW (ref 2.5–4.6)
Potassium: 3.2 mmol/L — ABNORMAL LOW (ref 3.5–5.1)
Sodium: 135 mmol/L (ref 135–145)

## 2019-03-07 NOTE — Progress Notes (Signed)
  [facility]  ROUNDING NOTE   Subjective:  Patient seen and evaluated during hemodialysis. Ultrafiltration stopped as patient experiencing cramping.  Objective:  Vital signs in last 24 hours:  Temperature 98.5 pulse 106 respirations 16 blood pressure 108/80  Physical Exam: General: No acute distress  Head: Normocephalic, atraumatic. Moist oral mucosal membranes  Eyes: Anicteric  Neck: Supple, trachea midline  Lungs:  Clear to auscultation, normal effort  Heart: S1S2 tachycardic  Abdomen:  Soft, nontender, bowel sounds present, colostomy in place  Extremities: Trace peripheral edema.  Neurologic: Awake, alert, conversant  Skin: No rashes  Access: RUE AVF    Basic Metabolic Panel: Recent Labs  Lab 03/02/19 0500 03/05/19 0500  NA 135 133*  K 5.0 4.7  CL 92* 92*  CO2 20* 25  GLUCOSE 187* 185*  BUN 51* 49*  CREATININE 4.93* 6.47*  CALCIUM 10.4* 10.0  PHOS 5.1* 3.6    Liver Function Tests: Recent Labs  Lab 03/02/19 0500 03/05/19 0500  ALBUMIN 2.5* 2.4*   No results for input(s): LIPASE, AMYLASE in the last 168 hours. No results for input(s): AMMONIA in the last 168 hours.  CBC: Recent Labs  Lab 03/02/19 0500 03/05/19 0500  WBC 13.8* 15.1*  HGB 11.8* 11.5*  HCT 37.0 36.8  MCV 91.6 91.3  PLT 411* 392    Cardiac Enzymes: No results for input(s): CKTOTAL, CKMB, CKMBINDEX, TROPONINI in the last 168 hours.  BNP: Invalid input(s): POCBNP  CBG: No results for input(s): GLUCAP in the last 168 hours.  Microbiology: No results found for this or any previous visit.  Coagulation Studies: No results for input(s): LABPROT, INR in the last 72 hours.  Urinalysis: No results for input(s): COLORURINE, LABSPEC, PHURINE, GLUCOSEU, HGBUR, BILIRUBINUR, KETONESUR, PROTEINUR, UROBILINOGEN, NITRITE, LEUKOCYTESUR in the last 72 hours.  Invalid input(s): APPERANCEUR    Imaging: No results found.   Medications:       Assessment/ Plan:  38 y.o.  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   1.  ESRD on HD.    Patient seen during dialysis treatment.  Had cramping earlier.  Ultrafiltration cut off.  Ultrafiltration achieved will likely be 0.5 kg.  Set lower target for next dialysis treatment.  2.  Anemia of chronic kidney disease.  Hemoglobin currently 11.5.  Currently off of Retacrit.  3.  Secondary hyperparathyroidism.  Continue to periodically monitor serum phosphorus.  4.  Lumbar discitis.  Patient treated with meropenem.   LOS: 0 Seletha Zimmermann 11/4/202012:06 PM

## 2019-03-08 ENCOUNTER — Other Ambulatory Visit (HOSPITAL_COMMUNITY): Payer: Self-pay

## 2019-03-08 ENCOUNTER — Encounter: Payer: Self-pay | Admitting: Radiology

## 2019-03-09 ENCOUNTER — Other Ambulatory Visit (HOSPITAL_COMMUNITY): Payer: Self-pay

## 2019-03-09 LAB — CBC
HCT: 32.8 % — ABNORMAL LOW (ref 36.0–46.0)
Hemoglobin: 10.5 g/dL — ABNORMAL LOW (ref 12.0–15.0)
MCH: 28.7 pg (ref 26.0–34.0)
MCHC: 32 g/dL (ref 30.0–36.0)
MCV: 89.6 fL (ref 80.0–100.0)
Platelets: 289 10*3/uL (ref 150–400)
RBC: 3.66 MIL/uL — ABNORMAL LOW (ref 3.87–5.11)
RDW: 14.9 % (ref 11.5–15.5)
WBC: 19 10*3/uL — ABNORMAL HIGH (ref 4.0–10.5)
nRBC: 0 % (ref 0.0–0.2)

## 2019-03-09 LAB — RENAL FUNCTION PANEL
Albumin: 2.1 g/dL — ABNORMAL LOW (ref 3.5–5.0)
Anion gap: 13 (ref 5–15)
BUN: 68 mg/dL — ABNORMAL HIGH (ref 6–20)
CO2: 26 mmol/L (ref 22–32)
Calcium: 10.4 mg/dL — ABNORMAL HIGH (ref 8.9–10.3)
Chloride: 95 mmol/L — ABNORMAL LOW (ref 98–111)
Creatinine, Ser: 4.77 mg/dL — ABNORMAL HIGH (ref 0.44–1.00)
GFR calc Af Amer: 12 mL/min — ABNORMAL LOW (ref >60)
GFR calc non Af Amer: 11 mL/min — ABNORMAL LOW (ref >60)
Glucose, Bld: 150 mg/dL — ABNORMAL HIGH (ref 70–99)
Phosphorus: 2.2 mg/dL — ABNORMAL LOW (ref 2.5–4.6)
Potassium: 4.4 mmol/L (ref 3.5–5.1)
Sodium: 134 mmol/L — ABNORMAL LOW (ref 135–145)

## 2019-03-09 NOTE — Consult Note (Signed)
Infectious Disease Consultation   [name] Barnett  [id number]  DOB: 1981/04/26  DOA: 02/07/2019  Requesting physician: Dr.Brown  Reason for consultation: Antibiotic recommendations   History of Present Illness: History obtained from medical records and personally confirmed with the patient. [name] Barnett is an 38 y.o. 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  She reportedly presented from [facility] to ER of the acute hospital on 01/10/2019 because the staff reportedly noted the patient had low oxygen levels.  She had previous hospitalization 12/08/2018 through 12/15/2018 when she was diagnosed with L4-L5 discitis status post biopsy.  Cultures were positive for Klebsiella.  She was on antibiotic course with 8 weeks of ceftazidime with dialysis.  When she presented to the ER on 01/10/2019 patient was noted to have progressive worsening of the pressure ulcer in the back.  She was also found to have leukocytosis with WBC count of 21.9 and borderline hypotension with blood pressure of 97/66 with tachycardia.  She was started on broad-spectrum antimicrobials for early sepsis.  She was seen by general surgery and underwent bedside debridement.  However, her leukocytosis continued to worsen despite debridement.  CT of the pelvis showed communication without abscess all the way up to the rectal fossa.  Patient was taken to the OR and was found to have extensive necrosis with foul odor and was debrided leaving 20 x 8 x 5 cm cavity.  On 01/13/2019 wound cultures showed ESBL Klebsiella, yeast.  Infectious disease was consulted and they continued  the patient on treatment with meropenem and Diflucan.  On 01/18/2019 she was noted to have worsening leukocytosis up to 44,000.  She underwent diverting ostomy which was performed on 01/25/2019.  Due to her complex medical problems she was transferred to [facility].  She completed treatment with meropenem on 02/11/2019.  She was found to have worsening leukocytosis with some fevers.  CT of the abdomen and pelvis done on 03/07/2019 continue to show discitis/osteomyelitis at the level of L4-L5 disc space.  Now restarted on meropenem.  She is complaining of some nausea, denies having any vomiting.  Denies having any chills, chest pain, shortness of breath, diarrhea.  Patient does not make much urine she is on dialysis.   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  Physical Exam: Temperature 97, pulse 90, respiratory rate 16, blood pressure 114/80, pulse oximetry 92% on room air Constitutional: Thin, ill-appearing female, awake and oriented, not in any acute distress at this time Eyes: PERLA, EOMI, irises appear normal, anicteric sclera,  ENMT: external  ears and nose appear normal, normal hearing, Lips appears normal, oropharynx, tongue normal  Neck: neck appears normal,  no masses, normal ROM, no thyromegaly, no JVD  CVS: S1-S2, no murmur Respiratory: Decreased breath sounds lower lobes, no wheezing, rales or rhonchi. Respiratory effort normal. No accessory muscle use.  Abdomen: Soft, has colostomy, positive bowel sounds Musculoskeletal: no cyanosis, clubbing or edema noted bilaterally Neuro: She is oriented x3, she has some mild slurring of the speech.  Has weakness greater in lower extremities than upper extremities. Psych: judgement and insight appear normal, stable mood and affect Skin: Stage IV sacrococcygeal pressure ulcer  Data reviewed:  I have personally reviewed following labs and imaging studies Labs:  CBC: Recent Labs  Lab 03/05/19 0500 03/07/19 1236 03/09/19 0500  WBC 15.1* 24.1* 19.0*  NEUTROABS  --  19.7*  --   HGB 11.5* 11.3* 10.5*  HCT 36.8 35.1* 32.8*  MCV 91.3 88.9 89.6  PLT 392 286 [id number]    Basic Metabolic Panel: Recent Labs  Lab 03/05/19 0500 03/07/19 1236 03/09/19 0500  NA 133* 135 134*  K 4.7 3.2* 4.4  CL 92* 96* 95*  CO2 25 24 26   GLUCOSE 185* 133* 150*  BUN 49* 20 68*  CREATININE 6.47* 2.21* 4.77*  CALCIUM 10.0 9.4 10.4*  PHOS 3.6 1.3* 2.2*   GFR CrCl cannot be calculated (Unknown ideal weight.). Liver Function Tests: Recent Labs  Lab 03/05/19 0500 03/07/19 1236 03/09/19 0500  ALBUMIN 2.4* 2.3* 2.1*   No results for input(s): LIPASE, AMYLASE in the last 168 hours. No results for input(s): AMMONIA in the last 168 hours. Coagulation profile No results for input(s): INR, PROTIME in the last 168 hours.  Cardiac Enzymes: No results for input(s): CKTOTAL, CKMB, CKMBINDEX, TROPONINI in the last 168 hours. BNP: Invalid input(s): POCBNP CBG: No results for input(s): GLUCAP in the last 168 hours. D-Dimer No results for input(s): DDIMER in the last 72 hours. Hgb A1c No results for input(s): HGBA1C in the last 72 hours. Lipid Profile No results for input(s): CHOL, HDL, LDLCALC, TRIG, CHOLHDL, LDLDIRECT in  the last 72 hours. Thyroid function studies No results for input(s): TSH, T4TOTAL, T3FREE, THYROIDAB in the last 72 hours.  Invalid input(s): FREET3 Anemia work up No results for input(s): VITAMINB12, FOLATE, FERRITIN, TIBC, IRON, RETICCTPCT in the last 72 hours. Urinalysis No results found for: COLORURINE, APPEARANCEUR, LABSPEC, [place], GLUCOSEU, HGBUR, BILIRUBINUR, KETONESUR, PROTEINUR, UROBILINOGEN, NITRITE, [place]   Microbiology No results found for this or any previous visit (from the past 240 hour(s)).  Inpatient Medications:   Scheduled Meds: Continuous Infusions:   Radiological Exams on Admission: Ct Lumbar Spine Wo Contrast  Result Date: 03/08/2019 CLINICAL DATA:  Lumbar discitis EXAM: CT LUMBAR SPINE WITHOUT CONTRAST TECHNIQUE: Multidetector CT imaging of the lumbar spine was performed without intravenous contrast administration. Multiplanar CT image reconstructions were also generated. COMPARISON:  None. FINDINGS: Segmentation: 5 lumbar type vertebrae. Alignment: There is some straightening of the normal lumbar lordotic curvature. Vertebrae: There are advanced destructive changes at the L4-L5 disc space, highly concerning for discitis osteomyelitis in the appropriate clinical setting. There are endplate changes at the superior endplate of the S1 vertebral body, presumably degenerative in etiology given the relatively normal appearance of the inferior endplate of the L5 vertebral body. There appears to be a posterior disc osteophyte complex at the L4-L5 and L5-S1 levels without significant spinal canal stenosis. There is no displaced fracture. Paraspinal and other soft tissues: No definite evidence for a paraspinal abscess, however  evaluation is limited by lack of IV contrast. Vascular calcifications are noted. Disc levels: There is disc space narrowing at the L3-L4 level. IMPRESSION: Findings highly suspicious for discitis osteomyelitis at the L4-L5 disc space. Electronically  Signed   By: Constance Holster M.D.   On: 03/08/2019 00:11   Dg Chest Port 1 View  Result Date: 03/08/2019 CLINICAL DATA:  Intravenous line placement EXAM: PORTABLE CHEST 1 VIEW COMPARISON:  None. FINDINGS: Right internal jugular PICC line is been placed. The tip is at the cavoatrial junction. No pneumothorax. Heart and mediastinal contours are within normal limits. No focal opacities or effusions. No acute bony abnormality. IMPRESSION: No active cardiopulmonary disease. Right internal jugular PICC line tip at the cavoatrial junction. Electronically Signed   By: Rolm Baptise M.D.   On: 03/08/2019 20:08   Dg Chest Port 1 View  Result Date: 03/07/2019 CLINICAL DATA:  Possible pneumonia. EXAM: PORTABLE CHEST 1 VIEW COMPARISON:  None. FINDINGS: Lungs are adequately inflated without focal airspace consolidation or effusion. Cardiomediastinal silhouette is within normal. Coronary stent present over the left heart. Remaining bones and soft tissues are normal. IMPRESSION: No active disease. Electronically Signed   By: Marin Olp M.D.   On: 03/07/2019 16:47   Dg Abd Portable 1v  Result Date: 03/07/2019 CLINICAL DATA:  Abdominopelvic pain.  Stoma. EXAM: PORTABLE ABDOMEN - 1 VIEW COMPARISON:  None. FINDINGS: Bowel gas pattern is nonobstructive. No free peritoneal air. No air-fluid levels. Remaining bones and soft tissues are unremarkable. IMPRESSION: Nonobstructive bowel gas pattern. Electronically Signed   By: Marin Olp M.D.   On: 03/07/2019 16:49    Impression/Recommendations Stage IV sacrococcygeal pressure ulcer Leukocytosis L4-L5 discitis/osteomyelitis Status post surgery for necrotizing fasciitis Status post diverting ostomy End-stage renal disease on hemodialysis Severe protein calorie malnutrition History of stroke with hemiparesis/weakness Chronic diastolic congestive heart failure History of coronary artery disease Diabetes mellitus type 2 Chronic pain  Stage IV sacrococcygeal  pressure ulcer: Patient underwent debridement at the previous hospital.  Cultures showed ESBL Klebsiella and Candida.  She completed treatment with meropenem, Diflucan.  Continue local wound care.  Images reviewed.  If the wound is worsening she may need debridement again.  However, now having worsening leukocytosis.  Restarted on IV meropenem.  Would also recommend to add IV vancomycin for gram positive coverage.  If any debridement done, then please send for wound cultures.  Leukocytosis: Likely secondary to L4-L5 discitis/osteomyelitis noted on the CT scan.  She also has stage IV sacrococcygeal pressure ulcer.  Started on IV meropenem.  Would also recommend to start IV vancomycin.  We will plan to treat for duration of 4-6 weeks pending improvement.  L4-L5 discitis/osteomyelitis: Could be contiguous spread from the stage IV sacrococcygeal pressure ulcer versus other source.  Recommend to check blood cultures.  Antibiotics as mentioned above.  Plan to treat for duration of 4 to 6 weeks pending improvement.   Status post surgery for necrotizing fasciitis: Continue local wound care.  She is also status post diverting ostomy.  End-stage renal disease on hemodialysis: Dialysis as per nephrology.  Antibiotics renally dosed.  Severe protein calorie malnutrition: Management per the primary team.  History of stroke with hemiparesis/weakness: Continue risk factor modification, medications per primary team.  Unfortunately due to her hemiparesis and weakness she is bedbound and this places her at a very high risk for further skin breakdown and worsening of the pressure ulcers.  Chronic diastolic congestive heart failure: Continue medication and management per the primary team.  Coronary artery disease: Continue medications and management per primary team.  Diabetes mellitus: Continue to monitor Accu-Cheks and medications with diabetes per the primary team.  She will need proper glycemic control in order to  enable wound healing.  Chronic pain: Continue pain medications per primary team.  Unfortunately due to her multiple complex medical problems she is a very high risk for worsening and decompensation.  Thank you for this consultation.   Yaakov Guthrie M.D. 03/09/2019, 11:02 AM

## 2019-03-09 NOTE — Progress Notes (Signed)
  Echocardiogram 2D Echocardiogram has been performed.  Burnett Kanaris 03/09/2019, 2:48 PM

## 2019-03-09 NOTE — Progress Notes (Signed)
[facility]  ROUNDING NOTE   Subjective:  Pt seen at bedside.  Just completed HD.    Objective:  Vital signs in last 24 hours:  Temperature 97.6 pulse 109 respirations 33 blood pressure 115/75  Physical Exam: General: No acute distress  Head: Normocephalic, atraumatic. Moist oral mucosal membranes  Eyes: Anicteric  Neck: Supple, trachea midline  Lungs:  Clear to auscultation, normal effort  Heart: S1S2 tachycardic  Abdomen:  Soft, nontender, bowel sounds present, colostomy in place  Extremities: Trace peripheral edema.  Neurologic: Awake, alert, conversant  Skin: No rashes  Access: RUE AVF    Basic Metabolic Panel: Recent Labs  Lab 03/05/19 0500 03/07/19 1236 03/09/19 0500  NA 133* 135 134*  K 4.7 3.2* 4.4  CL 92* 96* 95*  CO2 25 24 26   GLUCOSE 185* 133* 150*  BUN 49* 20 68*  CREATININE 6.47* 2.21* 4.77*  CALCIUM 10.0 9.4 10.4*  PHOS 3.6 1.3* 2.2*    Liver Function Tests: Recent Labs  Lab 03/05/19 0500 03/07/19 1236 03/09/19 0500  ALBUMIN 2.4* 2.3* 2.1*   No results for input(s): LIPASE, AMYLASE in the last 168 hours. No results for input(s): AMMONIA in the last 168 hours.  CBC: Recent Labs  Lab 03/05/19 0500 03/07/19 1236 03/09/19 0500  WBC 15.1* 24.1* 19.0*  NEUTROABS  --  19.7*  --   HGB 11.5* 11.3* 10.5*  HCT 36.8 35.1* 32.8*  MCV 91.3 88.9 89.6  PLT 392 286 289    Cardiac Enzymes: No results for input(s): CKTOTAL, CKMB, CKMBINDEX, TROPONINI in the last 168 hours.  BNP: Invalid input(s): POCBNP  CBG: No results for input(s): GLUCAP in the last 168 hours.  Microbiology: No results found for this or any previous visit.  Coagulation Studies: No results for input(s): LABPROT, INR in the last 72 hours.  Urinalysis: No results for input(s): COLORURINE, LABSPEC, PHURINE, GLUCOSEU, HGBUR, BILIRUBINUR, KETONESUR, PROTEINUR, UROBILINOGEN, NITRITE, LEUKOCYTESUR in the last 72 hours.  Invalid input(s): APPERANCEUR     Imaging: Ct Lumbar Spine Wo Contrast  Result Date: 03/08/2019 CLINICAL DATA:  Lumbar discitis EXAM: CT LUMBAR SPINE WITHOUT CONTRAST TECHNIQUE: Multidetector CT imaging of the lumbar spine was performed without intravenous contrast administration. Multiplanar CT image reconstructions were also generated. COMPARISON:  None. FINDINGS: Segmentation: 5 lumbar type vertebrae. Alignment: There is some straightening of the normal lumbar lordotic curvature. Vertebrae: There are advanced destructive changes at the L4-L5 disc space, highly concerning for discitis osteomyelitis in the appropriate clinical setting. There are endplate changes at the superior endplate of the S1 vertebral body, presumably degenerative in etiology given the relatively normal appearance of the inferior endplate of the L5 vertebral body. There appears to be a posterior disc osteophyte complex at the L4-L5 and L5-S1 levels without significant spinal canal stenosis. There is no displaced fracture. Paraspinal and other soft tissues: No definite evidence for a paraspinal abscess, however evaluation is limited by lack of IV contrast. Vascular calcifications are noted. Disc levels: There is disc space narrowing at the L3-L4 level. IMPRESSION: Findings highly suspicious for discitis osteomyelitis at the L4-L5 disc space. Electronically Signed   By: Constance Holster M.D.   On: 03/08/2019 00:11   Dg Chest Port 1 View  Result Date: 03/08/2019 CLINICAL DATA:  Intravenous line placement EXAM: PORTABLE CHEST 1 VIEW COMPARISON:  None. FINDINGS: Right internal jugular PICC line is been placed. The tip is at the cavoatrial junction. No pneumothorax. Heart and mediastinal contours are within normal limits. No focal opacities or  effusions. No acute bony abnormality. IMPRESSION: No active cardiopulmonary disease. Right internal jugular PICC line tip at the cavoatrial junction. Electronically Signed   By: Rolm Baptise M.D.   On: 03/08/2019 20:08   Dg  Chest Port 1 View  Result Date: 03/07/2019 CLINICAL DATA:  Possible pneumonia. EXAM: PORTABLE CHEST 1 VIEW COMPARISON:  None. FINDINGS: Lungs are adequately inflated without focal airspace consolidation or effusion. Cardiomediastinal silhouette is within normal. Coronary stent present over the left heart. Remaining bones and soft tissues are normal. IMPRESSION: No active disease. Electronically Signed   By: Marin Olp M.D.   On: 03/07/2019 16:47   Dg Abd Portable 1v  Result Date: 03/07/2019 CLINICAL DATA:  Abdominopelvic pain.  Stoma. EXAM: PORTABLE ABDOMEN - 1 VIEW COMPARISON:  None. FINDINGS: Bowel gas pattern is nonobstructive. No free peritoneal air. No air-fluid levels. Remaining bones and soft tissues are unremarkable. IMPRESSION: Nonobstructive bowel gas pattern. Electronically Signed   By: Marin Olp M.D.   On: 03/07/2019 16:49     Medications:       Assessment/ Plan:  38 y.o. 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   1.  ESRD on HD.    Pt just completed HD, will plan for HD again on MOnday.  2.  Anemia of chronic kidney disease.  Hgb currently 10.5, off retacrit as hgb got up to 11.3.   3.  Secondary hyperparathyroidism.  Phos up to 2.2, switched to regular diet.   4.  Lumbar discitis.  Patient treated with meropenem.   LOS: 0 Tanish Sinkler 11/6/202011:32 AM

## 2019-03-11 LAB — VANCOMYCIN, TROUGH: Vancomycin Tr: 16 ug/mL (ref 15–20)

## 2019-03-12 LAB — CBC
HCT: 30.3 % — ABNORMAL LOW (ref 36.0–46.0)
Hemoglobin: 9.5 g/dL — ABNORMAL LOW (ref 12.0–15.0)
MCH: 28.5 pg (ref 26.0–34.0)
MCHC: 31.4 g/dL (ref 30.0–36.0)
MCV: 91 fL (ref 80.0–100.0)
Platelets: 249 10*3/uL (ref 150–400)
RBC: 3.33 MIL/uL — ABNORMAL LOW (ref 3.87–5.11)
RDW: 15.1 % (ref 11.5–15.5)
WBC: 14.7 10*3/uL — ABNORMAL HIGH (ref 4.0–10.5)
nRBC: 0.1 % (ref 0.0–0.2)

## 2019-03-12 LAB — RENAL FUNCTION PANEL
Albumin: 1.9 g/dL — ABNORMAL LOW (ref 3.5–5.0)
Anion gap: 12 (ref 5–15)
BUN: 83 mg/dL — ABNORMAL HIGH (ref 6–20)
CO2: 29 mmol/L (ref 22–32)
Calcium: 10.7 mg/dL — ABNORMAL HIGH (ref 8.9–10.3)
Chloride: 94 mmol/L — ABNORMAL LOW (ref 98–111)
Creatinine, Ser: 5.51 mg/dL — ABNORMAL HIGH (ref 0.44–1.00)
GFR calc Af Amer: 10 mL/min — ABNORMAL LOW (ref >60)
GFR calc non Af Amer: 9 mL/min — ABNORMAL LOW (ref >60)
Glucose, Bld: 88 mg/dL (ref 70–99)
Phosphorus: 2.3 mg/dL — ABNORMAL LOW (ref 2.5–4.6)
Potassium: 4.5 mmol/L (ref 3.5–5.1)
Sodium: 135 mmol/L (ref 135–145)

## 2019-03-12 LAB — VANCOMYCIN, TROUGH: Vancomycin Tr: 14 ug/mL — ABNORMAL LOW (ref 15–20)

## 2019-03-12 NOTE — Progress Notes (Signed)
  [facility]  ROUNDING NOTE   Subjective:  Patient resting comfortably in bed. Due for hemodialysis later today.   Objective:  Vital signs in last 24 hours:  Pulse 101 respirations 16 blood pressure 109/89  Physical Exam: General: No acute distress  Head: Normocephalic, atraumatic. Moist oral mucosal membranes  Eyes: Anicteric  Neck: Supple, trachea midline  Lungs:  Clear to auscultation, normal effort  Heart: S1S2 tachycardic  Abdomen:  Soft, nontender, bowel sounds present, colostomy in place  Extremities: Trace peripheral edema.  Neurologic: Awake, alert, conversant  Skin: No rashes  Access: RUE AVF    Basic Metabolic Panel: Recent Labs  Lab 03/07/19 1236 03/09/19 0500 03/12/19 0500  NA 135 134* 135  K 3.2* 4.4 4.5  CL 96* 95* 94*  CO2 24 26 29   GLUCOSE 133* 150* 88  BUN 20 68* 83*  CREATININE 2.21* 4.77* 5.51*  CALCIUM 9.4 10.4* 10.7*  PHOS 1.3* 2.2* 2.3*    Liver Function Tests: Recent Labs  Lab 03/07/19 1236 03/09/19 0500 03/12/19 0500  ALBUMIN 2.3* 2.1* 1.9*   No results for input(s): LIPASE, AMYLASE in the last 168 hours. No results for input(s): AMMONIA in the last 168 hours.  CBC: Recent Labs  Lab 03/07/19 1236 03/09/19 0500 03/12/19 0500  WBC 24.1* 19.0* 14.7*  NEUTROABS 19.7*  --   --   HGB 11.3* 10.5* 9.5*  HCT 35.1* 32.8* 30.3*  MCV 88.9 89.6 91.0  PLT 286 289 249    Cardiac Enzymes: No results for input(s): CKTOTAL, CKMB, CKMBINDEX, TROPONINI in the last 168 hours.  BNP: Invalid input(s): POCBNP  CBG: No results for input(s): GLUCAP in the last 168 hours.  Microbiology: No results found for this or any previous visit.  Coagulation Studies: No results for input(s): LABPROT, INR in the last 72 hours.  Urinalysis: No results for input(s): COLORURINE, LABSPEC, PHURINE, GLUCOSEU, HGBUR, BILIRUBINUR, KETONESUR, PROTEINUR, UROBILINOGEN, NITRITE, LEUKOCYTESUR in the last 72 hours.  Invalid input(s): APPERANCEUR     Imaging: No results found.   Medications:       Assessment/ Plan:  38 y.o. female  with a PMHx of ESRD on HD in [place], chronic diastolic heart failure, coronary artery disease status post PCI, diabetes mellitus type 2, GERD, hypertension, history of CVA June 2020 with hemiparesis, expressive aphasia, recent necrotizing fasciitis with pressure ulcer, lumbar discitis who was admitted to [facility] on 02/07/2019 for ongoing wound care and continuation of hemodialysis services.   1.  ESRD on HD.    Patient due for hemodialysis today.  Orders have been prepared.  Continue dialysis on MWF schedule.  2.  Anemia of chronic kidney disease.  Hemoglobin now dropping and currently 9.5.  Consider restarting Retacrit.  3.  Secondary hyperparathyroidism.  Phosphorus 2.3.  Continue to monitor.  4.  Lumbar discitis.  Patient treated with meropenem.   LOS: 0 Nyhla Barnett 11/9/20208:23 AM

## 2019-03-13 LAB — HEPATITIS B E ANTIGEN: Hep B E Ag: NEGATIVE

## 2019-03-14 LAB — RENAL FUNCTION PANEL
Albumin: 1.9 g/dL — ABNORMAL LOW (ref 3.5–5.0)
Anion gap: 13 (ref 5–15)
BUN: 53 mg/dL — ABNORMAL HIGH (ref 6–20)
CO2: 25 mmol/L (ref 22–32)
Calcium: 10.2 mg/dL (ref 8.9–10.3)
Chloride: 95 mmol/L — ABNORMAL LOW (ref 98–111)
Creatinine, Ser: 5.08 mg/dL — ABNORMAL HIGH (ref 0.44–1.00)
GFR calc Af Amer: 11 mL/min — ABNORMAL LOW (ref >60)
GFR calc non Af Amer: 10 mL/min — ABNORMAL LOW (ref >60)
Glucose, Bld: 123 mg/dL — ABNORMAL HIGH (ref 70–99)
Phosphorus: 3.2 mg/dL (ref 2.5–4.6)
Potassium: 4.3 mmol/L (ref 3.5–5.1)
Sodium: 133 mmol/L — ABNORMAL LOW (ref 135–145)

## 2019-03-14 LAB — VANCOMYCIN, TROUGH: Vancomycin Tr: 16 ug/mL (ref 15–20)

## 2019-03-14 LAB — CBC
HCT: 29.5 % — ABNORMAL LOW (ref 36.0–46.0)
Hemoglobin: 9.4 g/dL — ABNORMAL LOW (ref 12.0–15.0)
MCH: 28.8 pg (ref 26.0–34.0)
MCHC: 31.9 g/dL (ref 30.0–36.0)
MCV: 90.5 fL (ref 80.0–100.0)
Platelets: 304 10*3/uL (ref 150–400)
RBC: 3.26 MIL/uL — ABNORMAL LOW (ref 3.87–5.11)
RDW: 15.2 % (ref 11.5–15.5)
WBC: 15.5 10*3/uL — ABNORMAL HIGH (ref 4.0–10.5)
nRBC: 0 % (ref 0.0–0.2)

## 2019-03-14 LAB — TROPONIN I (HIGH SENSITIVITY): Troponin I (High Sensitivity): 38 ng/L — ABNORMAL HIGH (ref <18)

## 2019-03-14 NOTE — Progress Notes (Signed)
  [facility]  ROUNDING NOTE   Subjective:  Patient seen and evaluated at bedside. Due for hemodialysis today. Resting comfortably at the moment.   Objective:  Vital signs in last 24 hours:  Temperature 96.7 pulse 97 respiration 16 blood pressure 121/65  Physical Exam: General: No acute distress  Head: Normocephalic, atraumatic. Moist oral mucosal membranes  Eyes: Anicteric  Neck: Supple, trachea midline  Lungs:  Clear to auscultation, normal effort  Heart: S1S2 no rubs  Abdomen:  Soft, nontender, bowel sounds present, colostomy in place  Extremities: No peripheral edema.  Neurologic: Awake, alert, conversant  Skin: No rashes  Access: RUE AVF    Basic Metabolic Panel: Recent Labs  Lab 03/07/19 1236 03/09/19 0500 03/12/19 0500 03/14/19 0500  NA 135 134* 135 133*  K 3.2* 4.4 4.5 4.3  CL 96* 95* 94* 95*  CO2 24 26 29 25   GLUCOSE 133* 150* 88 123*  BUN 20 68* 83* 53*  CREATININE 2.21* 4.77* 5.51* 5.08*  CALCIUM 9.4 10.4* 10.7* 10.2  PHOS 1.3* 2.2* 2.3* 3.2    Liver Function Tests: Recent Labs  Lab 03/07/19 1236 03/09/19 0500 03/12/19 0500 03/14/19 0500  ALBUMIN 2.3* 2.1* 1.9* 1.9*   No results for input(s): LIPASE, AMYLASE in the last 168 hours. No results for input(s): AMMONIA in the last 168 hours.  CBC: Recent Labs  Lab 03/07/19 1236 03/09/19 0500 03/12/19 0500 03/14/19 0500  WBC 24.1* 19.0* 14.7* 15.5*  NEUTROABS 19.7*  --   --   --   HGB 11.3* 10.5* 9.5* 9.4*  HCT 35.1* 32.8* 30.3* 29.5*  MCV 88.9 89.6 91.0 90.5  PLT 286 289 249 304    Cardiac Enzymes: No results for input(s): CKTOTAL, CKMB, CKMBINDEX, TROPONINI in the last 168 hours.  BNP: Invalid input(s): POCBNP  CBG: No results for input(s): GLUCAP in the last 168 hours.  Microbiology: No results found for this or any previous visit.  Coagulation Studies: No results for input(s): LABPROT, INR in the last 72 hours.  Urinalysis: No results for input(s): COLORURINE,  LABSPEC, PHURINE, GLUCOSEU, HGBUR, BILIRUBINUR, KETONESUR, PROTEINUR, UROBILINOGEN, NITRITE, LEUKOCYTESUR in the last 72 hours.  Invalid input(s): APPERANCEUR    Imaging: No results found.   Medications:       Assessment/ Plan:  38 y.o. female  with a PMHx of ESRD on HD in [place], chronic diastolic heart failure, coronary artery disease status post PCI, diabetes mellitus type 2, GERD, hypertension, history of CVA June 2020 with hemiparesis, expressive aphasia, recent necrotizing fasciitis with pressure ulcer, lumbar discitis who was admitted to [facility] on 02/07/2019 for ongoing wound care and continuation of hemodialysis services.   1.  ESRD on HD.    We will maintain the patient on dialysis on MWF schedule.  Reduce ultrafiltration target to 0.5 to 1 kg today.  2.  Anemia of chronic kidney disease.  Hemoglobin currently 9.4.  Hold off on Retacrit for now.  3.  Secondary hyperparathyroidism.  Phosphorus 3.2 and acceptable.  Not on binder therapy.  4.  Lumbar discitis.  Patient treated with meropenem.   LOS: 0 [name] Barnett 11/11/20209:56 AM

## 2019-03-15 LAB — TROPONIN I (HIGH SENSITIVITY): Troponin I (High Sensitivity): 39 ng/L — ABNORMAL HIGH (ref <18)

## 2019-03-16 LAB — CBC
HCT: 30.4 % — ABNORMAL LOW (ref 36.0–46.0)
Hemoglobin: 9.6 g/dL — ABNORMAL LOW (ref 12.0–15.0)
MCH: 29 pg (ref 26.0–34.0)
MCHC: 31.6 g/dL (ref 30.0–36.0)
MCV: 91.8 fL (ref 80.0–100.0)
Platelets: 332 10*3/uL (ref 150–400)
RBC: 3.31 MIL/uL — ABNORMAL LOW (ref 3.87–5.11)
RDW: 15.9 % — ABNORMAL HIGH (ref 11.5–15.5)
WBC: 14.8 10*3/uL — ABNORMAL HIGH (ref 4.0–10.5)
nRBC: 0 % (ref 0.0–0.2)

## 2019-03-16 LAB — RENAL FUNCTION PANEL
Albumin: 2 g/dL — ABNORMAL LOW (ref 3.5–5.0)
Anion gap: 12 (ref 5–15)
BUN: 34 mg/dL — ABNORMAL HIGH (ref 6–20)
CO2: 24 mmol/L (ref 22–32)
Calcium: 9.8 mg/dL (ref 8.9–10.3)
Chloride: 97 mmol/L — ABNORMAL LOW (ref 98–111)
Creatinine, Ser: 4.94 mg/dL — ABNORMAL HIGH (ref 0.44–1.00)
GFR calc Af Amer: 12 mL/min — ABNORMAL LOW (ref >60)
GFR calc non Af Amer: 10 mL/min — ABNORMAL LOW (ref >60)
Glucose, Bld: 92 mg/dL (ref 70–99)
Phosphorus: 2.3 mg/dL — ABNORMAL LOW (ref 2.5–4.6)
Potassium: 4.1 mmol/L (ref 3.5–5.1)
Sodium: 133 mmol/L — ABNORMAL LOW (ref 135–145)

## 2019-03-16 LAB — VANCOMYCIN, TROUGH: Vancomycin Tr: 17 ug/mL (ref 15–20)

## 2019-03-16 NOTE — Progress Notes (Signed)
  [facility]  ROUNDING NOTE   Subjective:  Patient due for hemodialysis today. She is resting comfortably. No acute complaints.    Objective:  Vital signs in last 24 hours:  Temperature 97 pulse 102 respirations 15 blood pressure 125/91  Physical Exam: General: No acute distress  Head: Normocephalic, atraumatic. Moist oral mucosal membranes  Eyes: Anicteric  Neck: Supple, trachea midline  Lungs:  Clear to auscultation, normal effort  Heart: S1S2 no rubs  Abdomen:  Soft, nontender, bowel sounds present, colostomy in place  Extremities: No peripheral edema.  Neurologic: Awake, alert, conversant  Skin: No rashes  Access: RUE AVF    Basic Metabolic Panel: Recent Labs  Lab 03/12/19 0500 03/14/19 0500  NA 135 133*  K 4.5 4.3  CL 94* 95*  CO2 29 25  GLUCOSE 88 123*  BUN 83* 53*  CREATININE 5.51* 5.08*  CALCIUM 10.7* 10.2  PHOS 2.3* 3.2    Liver Function Tests: Recent Labs  Lab 03/12/19 0500 03/14/19 0500  ALBUMIN 1.9* 1.9*   No results for input(s): LIPASE, AMYLASE in the last 168 hours. No results for input(s): AMMONIA in the last 168 hours.  CBC: Recent Labs  Lab 03/12/19 0500 03/14/19 0500  WBC 14.7* 15.5*  HGB 9.5* 9.4*  HCT 30.3* 29.5*  MCV 91.0 90.5  PLT 249 304    Cardiac Enzymes: No results for input(s): CKTOTAL, CKMB, CKMBINDEX, TROPONINI in the last 168 hours.  BNP: Invalid input(s): POCBNP  CBG: No results for input(s): GLUCAP in the last 168 hours.  Microbiology: No results found for this or any previous visit.  Coagulation Studies: No results for input(s): LABPROT, INR in the last 72 hours.  Urinalysis: No results for input(s): COLORURINE, LABSPEC, PHURINE, GLUCOSEU, HGBUR, BILIRUBINUR, KETONESUR, PROTEINUR, UROBILINOGEN, NITRITE, LEUKOCYTESUR in the last 72 hours.  Invalid input(s): APPERANCEUR    Imaging: No results found.   Medications:       Assessment/ Plan:  38 y.o. 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   1.  ESRD on HD.    Patient due for dialysis today.  Orders have been prepared.  Ultrafiltration target 1 kg.  2.  Anemia of chronic kidney disease.  Patient remains off oral Retacrit currently as previously her hemoglobin  3.  Secondary hyperparathyroidism.  Repeat serum phosphorus today.  4.  Lumbar discitis.  Patient treated with meropenem.   LOS: 0 [name] Barnett 11/13/20207:42 AM

## 2019-03-19 LAB — CBC
HCT: 28.5 % — ABNORMAL LOW (ref 36.0–46.0)
Hemoglobin: 9 g/dL — ABNORMAL LOW (ref 12.0–15.0)
MCH: 28.2 pg (ref 26.0–34.0)
MCHC: 31.6 g/dL (ref 30.0–36.0)
MCV: 89.3 fL (ref 80.0–100.0)
Platelets: 291 10*3/uL (ref 150–400)
RBC: 3.19 MIL/uL — ABNORMAL LOW (ref 3.87–5.11)
RDW: 16.3 % — ABNORMAL HIGH (ref 11.5–15.5)
WBC: 13.7 10*3/uL — ABNORMAL HIGH (ref 4.0–10.5)
nRBC: 0 % (ref 0.0–0.2)

## 2019-03-19 LAB — RENAL FUNCTION PANEL
Albumin: 1.9 g/dL — ABNORMAL LOW (ref 3.5–5.0)
Anion gap: 15 (ref 5–15)
BUN: 75 mg/dL — ABNORMAL HIGH (ref 6–20)
CO2: 24 mmol/L (ref 22–32)
Calcium: 10.4 mg/dL — ABNORMAL HIGH (ref 8.9–10.3)
Chloride: 93 mmol/L — ABNORMAL LOW (ref 98–111)
Creatinine, Ser: 6.22 mg/dL — ABNORMAL HIGH (ref 0.44–1.00)
GFR calc Af Amer: 9 mL/min — ABNORMAL LOW (ref >60)
GFR calc non Af Amer: 8 mL/min — ABNORMAL LOW (ref >60)
Glucose, Bld: 104 mg/dL — ABNORMAL HIGH (ref 70–99)
Phosphorus: 2.3 mg/dL — ABNORMAL LOW (ref 2.5–4.6)
Potassium: 3.9 mmol/L (ref 3.5–5.1)
Sodium: 132 mmol/L — ABNORMAL LOW (ref 135–145)

## 2019-03-19 LAB — VANCOMYCIN, TROUGH: Vancomycin Tr: 20 ug/mL (ref 15–20)

## 2019-03-19 NOTE — Progress Notes (Signed)
  [facility]  ROUNDING NOTE   Subjective:  Patient feeling well this a.m. She is due for hemodialysis today. No acute complaints at the moment.   Objective:  Vital signs in last 24 hours:  Temperature 97.4 pulse 107 respirations 15 blood pressure 128/99  Physical Exam: General: No acute distress  Head: Normocephalic, atraumatic. Moist oral mucosal membranes  Eyes: Anicteric  Neck: Supple, trachea midline  Lungs:  Clear to auscultation, normal effort  Heart: S1S2 no rubs  Abdomen:  Soft, nontender, bowel sounds present, colostomy in place  Extremities: No peripheral edema.  Neurologic: Awake, alert, conversant  Skin: No rashes  Access: RUE AVF    Basic Metabolic Panel: Recent Labs  Lab 03/14/19 0500 03/16/19 0820 03/19/19 0500  NA 133* 133* 132*  K 4.3 4.1 3.9  CL 95* 97* 93*  CO2 25 24 24   GLUCOSE 123* 92 104*  BUN 53* 34* 75*  CREATININE 5.08* 4.94* 6.22*  CALCIUM 10.2 9.8 10.4*  PHOS 3.2 2.3* 2.3*    Liver Function Tests: Recent Labs  Lab 03/14/19 0500 03/16/19 0820 03/19/19 0500  ALBUMIN 1.9* 2.0* 1.9*   No results for input(s): LIPASE, AMYLASE in the last 168 hours. No results for input(s): AMMONIA in the last 168 hours.  CBC: Recent Labs  Lab 03/14/19 0500 03/16/19 0819 03/19/19 0500  WBC 15.5* 14.8* 13.7*  HGB 9.4* 9.6* 9.0*  HCT 29.5* 30.4* 28.5*  MCV 90.5 91.8 89.3  PLT 304 332 291    Cardiac Enzymes: No results for input(s): CKTOTAL, CKMB, CKMBINDEX, TROPONINI in the last 168 hours.  BNP: Invalid input(s): POCBNP  CBG: No results for input(s): GLUCAP in the last 168 hours.  Microbiology: No results found for this or any previous visit.  Coagulation Studies: No results for input(s): LABPROT, INR in the last 72 hours.  Urinalysis: No results for input(s): COLORURINE, LABSPEC, PHURINE, GLUCOSEU, HGBUR, BILIRUBINUR, KETONESUR, PROTEINUR, UROBILINOGEN, NITRITE, LEUKOCYTESUR in the last 72 hours.  Invalid input(s):  APPERANCEUR    Imaging: No results found.   Medications:       Assessment/ Plan:  38 y.o. 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   1.  ESRD on HD.    We will maintain the patient on MWF dialysis schedule.  She is due for dialysis treatment today.  2.  Anemia of chronic kidney disease.  Hemoglobin currently 9.0.  Previously when she was on Retacrit hemoglobin rose significantly.  3.  Secondary hyperparathyroidism.  Phosphorus 2.3 and acceptable.  4.  Lumbar discitis.  Patient treated with meropenem.   LOS: 0 Landin Tallon 11/16/20208:04 AM

## 2019-03-21 LAB — RENAL FUNCTION PANEL
Albumin: 2.1 g/dL — ABNORMAL LOW (ref 3.5–5.0)
Anion gap: 12 (ref 5–15)
BUN: 74 mg/dL — ABNORMAL HIGH (ref 6–20)
CO2: 23 mmol/L (ref 22–32)
Calcium: 10.4 mg/dL — ABNORMAL HIGH (ref 8.9–10.3)
Chloride: 99 mmol/L (ref 98–111)
Creatinine, Ser: 5.26 mg/dL — ABNORMAL HIGH (ref 0.44–1.00)
GFR calc Af Amer: 11 mL/min — ABNORMAL LOW (ref >60)
GFR calc non Af Amer: 9 mL/min — ABNORMAL LOW (ref >60)
Glucose, Bld: 174 mg/dL — ABNORMAL HIGH (ref 70–99)
Phosphorus: 2.2 mg/dL — ABNORMAL LOW (ref 2.5–4.6)
Potassium: 4.2 mmol/L (ref 3.5–5.1)
Sodium: 134 mmol/L — ABNORMAL LOW (ref 135–145)

## 2019-03-21 LAB — VANCOMYCIN, TROUGH: Vancomycin Tr: 17 ug/mL (ref 15–20)

## 2019-03-21 LAB — CBC
HCT: 28.7 % — ABNORMAL LOW (ref 36.0–46.0)
Hemoglobin: 9.3 g/dL — ABNORMAL LOW (ref 12.0–15.0)
MCH: 28.8 pg (ref 26.0–34.0)
MCHC: 32.4 g/dL (ref 30.0–36.0)
MCV: 88.9 fL (ref 80.0–100.0)
Platelets: 294 10*3/uL (ref 150–400)
RBC: 3.23 MIL/uL — ABNORMAL LOW (ref 3.87–5.11)
RDW: 16.5 % — ABNORMAL HIGH (ref 11.5–15.5)
WBC: 11.5 10*3/uL — ABNORMAL HIGH (ref 4.0–10.5)
nRBC: 0 % (ref 0.0–0.2)

## 2019-03-21 NOTE — Progress Notes (Signed)
  [facility]  ROUNDING NOTE   Subjective:  Patient states that she was able to stand recently. Continues to tolerate dialysis well. Due for dialysis treatment today.  Objective:  Vital signs in last 24 hours:  Pulse 96.7 pulse 97 respirations 20 blood pressure 104/63  Physical Exam: General: No acute distress  Head: Normocephalic, atraumatic. Moist oral mucosal membranes  Eyes: Anicteric  Neck: Supple, trachea midline  Lungs:  Clear to auscultation, normal effort  Heart: S1S2 no rubs  Abdomen:  Soft, nontender, bowel sounds present, colostomy in place  Extremities: No peripheral edema.  Neurologic: Awake, alert, conversant  Skin: No rashes  Access: RUE AVF    Basic Metabolic Panel: Recent Labs  Lab 03/16/19 0820 03/19/19 0500  NA 133* 132*  K 4.1 3.9  CL 97* 93*  CO2 24 24  GLUCOSE 92 104*  BUN 34* 75*  CREATININE 4.94* 6.22*  CALCIUM 9.8 10.4*  PHOS 2.3* 2.3*    Liver Function Tests: Recent Labs  Lab 03/16/19 0820 03/19/19 0500  ALBUMIN 2.0* 1.9*   No results for input(s): LIPASE, AMYLASE in the last 168 hours. No results for input(s): AMMONIA in the last 168 hours.  CBC: Recent Labs  Lab 03/16/19 0819 03/19/19 0500  WBC 14.8* 13.7*  HGB 9.6* 9.0*  HCT 30.4* 28.5*  MCV 91.8 89.3  PLT 332 291    Cardiac Enzymes: No results for input(s): CKTOTAL, CKMB, CKMBINDEX, TROPONINI in the last 168 hours.  BNP: Invalid input(s): POCBNP  CBG: No results for input(s): GLUCAP in the last 168 hours.  Microbiology: No results found for this or any previous visit.  Coagulation Studies: No results for input(s): LABPROT, INR in the last 72 hours.  Urinalysis: No results for input(s): COLORURINE, LABSPEC, PHURINE, GLUCOSEU, HGBUR, BILIRUBINUR, KETONESUR, PROTEINUR, UROBILINOGEN, NITRITE, LEUKOCYTESUR in the last 72 hours.  Invalid input(s): APPERANCEUR    Imaging: No results found.   Medications:       Assessment/ Plan:  38 y.o.  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   1.  ESRD on HD.    Patient due for hemodialysis today.  Orders have been prepared.  2.  Anemia of chronic kidney disease.  Repeat CBC today.  Most recent hemoglobin was 9.0.  3.  Secondary hyperparathyroidism.  Phosphorus 2.3 and at target.  Continue to monitor.  4.  Lumbar discitis.  Patient treated with meropenem.   LOS: 0 Johnte Portnoy 11/18/20209:44 AM

## 2019-03-22 LAB — SARS CORONAVIRUS 2 (TAT 6-24 HRS): SARS Coronavirus 2: NEGATIVE

## 2019-03-23 LAB — CBC
HCT: 27.9 % — ABNORMAL LOW (ref 36.0–46.0)
Hemoglobin: 8.5 g/dL — ABNORMAL LOW (ref 12.0–15.0)
MCH: 28 pg (ref 26.0–34.0)
MCHC: 30.5 g/dL (ref 30.0–36.0)
MCV: 91.8 fL (ref 80.0–100.0)
Platelets: 302 10*3/uL (ref 150–400)
RBC: 3.04 MIL/uL — ABNORMAL LOW (ref 3.87–5.11)
RDW: 17.2 % — ABNORMAL HIGH (ref 11.5–15.5)
WBC: 12.2 10*3/uL — ABNORMAL HIGH (ref 4.0–10.5)
nRBC: 0 % (ref 0.0–0.2)

## 2019-03-23 LAB — RENAL FUNCTION PANEL
Albumin: 1.9 g/dL — ABNORMAL LOW (ref 3.5–5.0)
Anion gap: 11 (ref 5–15)
BUN: 60 mg/dL — ABNORMAL HIGH (ref 6–20)
CO2: 25 mmol/L (ref 22–32)
Calcium: 8.4 mg/dL — ABNORMAL LOW (ref 8.9–10.3)
Chloride: 98 mmol/L (ref 98–111)
Creatinine, Ser: 5.18 mg/dL — ABNORMAL HIGH (ref 0.44–1.00)
GFR calc Af Amer: 11 mL/min — ABNORMAL LOW (ref >60)
GFR calc non Af Amer: 10 mL/min — ABNORMAL LOW (ref >60)
Glucose, Bld: 235 mg/dL — ABNORMAL HIGH (ref 70–99)
Phosphorus: 1.1 mg/dL — ABNORMAL LOW (ref 2.5–4.6)
Potassium: 5.3 mmol/L — ABNORMAL HIGH (ref 3.5–5.1)
Sodium: 134 mmol/L — ABNORMAL LOW (ref 135–145)

## 2019-03-23 NOTE — Progress Notes (Signed)
[facility]  ROUNDING NOTE   Subjective:  Patient resting comfortably in bed this a.m. She is due for hemodialysis today. Appears to be in good spirits today.  Objective:  Vital signs in last 24 hours:  Temperature 97.5 pulse 107 respirations 24 blood pressure 107/61  Physical Exam: General: No acute distress  Head: Normocephalic, atraumatic. Moist oral mucosal membranes  Eyes: Anicteric  Neck: Supple, trachea midline  Lungs:  Clear to auscultation, normal effort  Heart: S1S2 no rubs  Abdomen:  Soft, nontender, bowel sounds present, colostomy in place  Extremities: No peripheral edema.  Neurologic: Awake, alert, conversant  Skin: No rashes  Access: RUE AVF    Basic Metabolic Panel: Recent Labs  Lab 03/16/19 0820 03/19/19 0500 03/21/19 1200  NA 133* 132* 134*  K 4.1 3.9 4.2  CL 97* 93* 99  CO2 24 24 23   GLUCOSE 92 104* 174*  BUN 34* 75* 74*  CREATININE 4.94* 6.22* 5.26*  CALCIUM 9.8 10.4* 10.4*  PHOS 2.3* 2.3* 2.2*    Liver Function Tests: Recent Labs  Lab 03/16/19 0820 03/19/19 0500 03/21/19 1200  ALBUMIN 2.0* 1.9* 2.1*   No results for input(s): LIPASE, AMYLASE in the last 168 hours. No results for input(s): AMMONIA in the last 168 hours.  CBC: Recent Labs  Lab 03/16/19 0819 03/19/19 0500 03/21/19 1200  WBC 14.8* 13.7* 11.5*  HGB 9.6* 9.0* 9.3*  HCT 30.4* 28.5* 28.7*  MCV 91.8 89.3 88.9  PLT 332 291 294    Cardiac Enzymes: No results for input(s): CKTOTAL, CKMB, CKMBINDEX, TROPONINI in the last 168 hours.  BNP: Invalid input(s): POCBNP  CBG: No results for input(s): GLUCAP in the last 168 hours.  Microbiology: Results for orders placed or performed during the hospital encounter of 02/07/19  SARS CORONAVIRUS 2 (TAT 6-24 HRS) Nasopharyngeal Nasopharyngeal Swab     Status: None   Collection Time: 03/22/19  4:23 PM   Specimen: Nasopharyngeal Swab  Result Value Ref Range Status   SARS Coronavirus 2 NEGATIVE NEGATIVE Final     Comment: (NOTE) SARS-CoV-2 target nucleic acids are NOT DETECTED. The SARS-CoV-2 RNA is generally detectable in upper and lower respiratory specimens during the acute phase of infection. Negative results do not preclude SARS-CoV-2 infection, do not rule out co-infections with other pathogens, and should not be used as the sole basis for treatment or other patient management decisions. Negative results must be combined with clinical observations, patient history, and epidemiological information. The expected result is Negative. Fact Sheet for Patients: SugarRoll.be Fact Sheet for Healthcare Providers: https://www.woods-mathews.com/ This test is not yet approved or cleared by the [place] FDA and  has been authorized for detection and/or diagnosis of SARS-CoV-2 by FDA under an Emergency Use Authorization (EUA). This EUA will remain  in effect (meaning this test can be used) for the duration of the COVID-19 declaration under Section 56 4(b)(1) of the Act, 21 U.S.C. section 360bbb-3(b)(1), unless the authorization is terminated or revoked sooner. Performed at [facility], [place] [address] [place], [place] [address]     Coagulation Studies: No results for input(s): LABPROT, INR in the last 72 hours.  Urinalysis: No results for input(s): COLORURINE, LABSPEC, PHURINE, GLUCOSEU, HGBUR, BILIRUBINUR, KETONESUR, PROTEINUR, UROBILINOGEN, NITRITE, LEUKOCYTESUR in the last 72 hours.  Invalid input(s): APPERANCEUR    Imaging: No results found.   Medications:       Assessment/ Plan:  38 y.o. 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   1.  ESRD on HD.    Maintain the patient on hemodialysis schedule MWF.  Due for dialysis today.  After today next treatment will be on Monday.  2.  Anemia of chronic kidney disease.  Hemoglobin a bit higher at 9.3.  Previously on Retacrit her hemoglobin became a bit too high..  3.  Secondary hyperparathyroidism.  Phosphorus 2.1 at last check.  We plan to recheck this today.  4.  Lumbar discitis.  Patient treated with meropenem.   LOS: 0 [name] Barnett 11/20/20208:07 AM

## 2019-03-26 LAB — RENAL FUNCTION PANEL
Albumin: 1.7 g/dL — ABNORMAL LOW (ref 3.5–5.0)
Anion gap: 8 (ref 5–15)
BUN: 55 mg/dL — ABNORMAL HIGH (ref 6–20)
CO2: 24 mmol/L (ref 22–32)
Calcium: 8.8 mg/dL — ABNORMAL LOW (ref 8.9–10.3)
Chloride: 103 mmol/L (ref 98–111)
Creatinine, Ser: 5.89 mg/dL — ABNORMAL HIGH (ref 0.44–1.00)
GFR calc Af Amer: 10 mL/min — ABNORMAL LOW (ref >60)
GFR calc non Af Amer: 8 mL/min — ABNORMAL LOW (ref >60)
Glucose, Bld: 156 mg/dL — ABNORMAL HIGH (ref 70–99)
Phosphorus: 1.2 mg/dL — ABNORMAL LOW (ref 2.5–4.6)
Potassium: 4.2 mmol/L (ref 3.5–5.1)
Sodium: 135 mmol/L (ref 135–145)

## 2019-03-26 LAB — CBC
HCT: 24.4 % — ABNORMAL LOW (ref 36.0–46.0)
Hemoglobin: 7.6 g/dL — ABNORMAL LOW (ref 12.0–15.0)
MCH: 28.5 pg (ref 26.0–34.0)
MCHC: 31.1 g/dL (ref 30.0–36.0)
MCV: 91.4 fL (ref 80.0–100.0)
Platelets: 343 10*3/uL (ref 150–400)
RBC: 2.67 MIL/uL — ABNORMAL LOW (ref 3.87–5.11)
RDW: 17.4 % — ABNORMAL HIGH (ref 11.5–15.5)
WBC: 9.9 10*3/uL (ref 4.0–10.5)
nRBC: 0 % (ref 0.0–0.2)

## 2019-03-26 LAB — VANCOMYCIN, TROUGH: Vancomycin Tr: 18 ug/mL (ref 15–20)

## 2019-03-26 NOTE — Progress Notes (Signed)
[facility]  ROUNDING NOTE   Subjective:  Patient seen and evaluated at bedside this AM. She will be due for hemodialysis today. Orders have been prepared for today.  Objective:  Vital signs in last 24 hours:  Temperature 97.9 pulse 100 respirations 25 blood pressure 125/79  Physical Exam: General: No acute distress  Head: Normocephalic, atraumatic. Moist oral mucosal membranes  Eyes: Anicteric  Neck: Supple, trachea midline  Lungs:  Clear to auscultation, normal effort  Heart: S1S2 no rubs  Abdomen:  Soft, nontender, bowel sounds present, colostomy in place  Extremities: No peripheral edema.  Neurologic: Awake, alert, conversant  Skin: No rashes  Access: RUE AVF    Basic Metabolic Panel: Recent Labs  Lab 03/21/19 1200 03/23/19 0801 03/26/19 0549  NA 134* 134* 135  K 4.2 5.3* 4.2  CL 99 98 103  CO2 23 25 24   GLUCOSE 174* 235* 156*  BUN 74* 60* 55*  CREATININE 5.26* 5.18* 5.89*  CALCIUM 10.4* 8.4* 8.8*  PHOS 2.2* 1.1* 1.2*    Liver Function Tests: Recent Labs  Lab 03/21/19 1200 03/23/19 0801 03/26/19 0549  ALBUMIN 2.1* 1.9* 1.7*   No results for input(s): LIPASE, AMYLASE in the last 168 hours. No results for input(s): AMMONIA in the last 168 hours.  CBC: Recent Labs  Lab 03/21/19 1200 03/23/19 0801 03/26/19 0549  WBC 11.5* 12.2* 9.9  HGB 9.3* 8.5* 7.6*  HCT 28.7* 27.9* 24.4*  MCV 88.9 91.8 91.4  PLT 294 302 343    Cardiac Enzymes: No results for input(s): CKTOTAL, CKMB, CKMBINDEX, TROPONINI in the last 168 hours.  BNP: Invalid input(s): POCBNP  CBG: No results for input(s): GLUCAP in the last 168 hours.  Microbiology: Results for orders placed or performed during the hospital encounter of 02/07/19  SARS CORONAVIRUS 2 (TAT 6-24 HRS) Nasopharyngeal Nasopharyngeal Swab     Status: None   Collection Time: 03/22/19  4:23 PM   Specimen: Nasopharyngeal Swab  Result Value Ref Range Status   SARS Coronavirus 2 NEGATIVE NEGATIVE  Final    Comment: (NOTE) SARS-CoV-2 target nucleic acids are NOT DETECTED. The SARS-CoV-2 RNA is generally detectable in upper and lower respiratory specimens during the acute phase of infection. Negative results do not preclude SARS-CoV-2 infection, do not rule out co-infections with other pathogens, and should not be used as the sole basis for treatment or other patient management decisions. Negative results must be combined with clinical observations, patient history, and epidemiological information. The expected result is Negative. Fact Sheet for Patients: SugarRoll.be Fact Sheet for Healthcare Providers: https://www.woods-mathews.com/ This test is not yet approved or cleared by the [place] FDA and  has been authorized for detection and/or diagnosis of SARS-CoV-2 by FDA under an Emergency Use Authorization (EUA). This EUA will remain  in effect (meaning this test can be used) for the duration of the COVID-19 declaration under Section 56 4(b)(1) of the Act, 21 U.S.C. section 360bbb-3(b)(1), unless the authorization is terminated or revoked sooner. Performed at [facility], [place] [address], [place], [place] [address]     Coagulation Studies: No results for input(s): LABPROT, INR in the last 72 hours.  Urinalysis: No results for input(s): COLORURINE, LABSPEC, PHURINE, GLUCOSEU, HGBUR, BILIRUBINUR, KETONESUR, PROTEINUR, UROBILINOGEN, NITRITE, LEUKOCYTESUR in the last 72 hours.  Invalid input(s): APPERANCEUR    Imaging: No results found.   Medications:       Assessment/ Plan:  38 y.o. 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   1.  ESRD on HD.    Patient is due for dialysis today.  Orders have been prepared.  2.  Anemia of chronic kidney disease.  Hemoglobin down significantly over the weekend.  Hemoglobin down to 7.6.  Will discuss with hospitalist.  3.  Secondary hyperparathyroidism.  Phosphorus low at 1.2.  Not on phosphorus binders.  Administer sodium phosphorus 20 mmol IV x1 today.  4.  Lumbar discitis.  Patient treated with meropenem.   LOS: 0 [name] Barnett 11/23/20208:48 AM

## 2019-03-27 ENCOUNTER — Other Ambulatory Visit (HOSPITAL_COMMUNITY): Payer: Self-pay

## 2019-03-28 LAB — CBC
HCT: 26 % — ABNORMAL LOW (ref 36.0–46.0)
Hemoglobin: 7.9 g/dL — ABNORMAL LOW (ref 12.0–15.0)
MCH: 28 pg (ref 26.0–34.0)
MCHC: 30.4 g/dL (ref 30.0–36.0)
MCV: 92.2 fL (ref 80.0–100.0)
Platelets: 339 10*3/uL (ref 150–400)
RBC: 2.82 MIL/uL — ABNORMAL LOW (ref 3.87–5.11)
RDW: 17.4 % — ABNORMAL HIGH (ref 11.5–15.5)
WBC: 9.1 10*3/uL (ref 4.0–10.5)
nRBC: 0 % (ref 0.0–0.2)

## 2019-03-28 LAB — RENAL FUNCTION PANEL
Albumin: 1.8 g/dL — ABNORMAL LOW (ref 3.5–5.0)
Anion gap: 10 (ref 5–15)
BUN: 54 mg/dL — ABNORMAL HIGH (ref 6–20)
CO2: 25 mmol/L (ref 22–32)
Calcium: 8.7 mg/dL — ABNORMAL LOW (ref 8.9–10.3)
Chloride: 99 mmol/L (ref 98–111)
Creatinine, Ser: 4.35 mg/dL — ABNORMAL HIGH (ref 0.44–1.00)
GFR calc Af Amer: 14 mL/min — ABNORMAL LOW (ref >60)
GFR calc non Af Amer: 12 mL/min — ABNORMAL LOW (ref >60)
Glucose, Bld: 90 mg/dL (ref 70–99)
Phosphorus: 1.1 mg/dL — ABNORMAL LOW (ref 2.5–4.6)
Potassium: 4.3 mmol/L (ref 3.5–5.1)
Sodium: 134 mmol/L — ABNORMAL LOW (ref 135–145)

## 2019-03-28 LAB — VANCOMYCIN, TROUGH: Vancomycin Tr: 18 ug/mL (ref 15–20)

## 2019-03-28 NOTE — Progress Notes (Signed)
[facility]  ROUNDING NOTE   Subjective:  Patient seen and evaluated during hemodialysis. Tolerating well. Ultrafiltration target 1 kg.  Objective:  Vital signs in last 24 hours:  Temperature 97.7 pulse 80 respirations 18 blood pressure 140/79  Physical Exam: General: No acute distress  Head: Normocephalic, atraumatic. Moist oral mucosal membranes  Eyes: Anicteric  Neck: Supple, trachea midline  Lungs:  Clear to auscultation, normal effort  Heart: S1S2 no rubs  Abdomen:  Soft, nontender, bowel sounds present, colostomy in place  Extremities: No peripheral edema.  Neurologic: Awake, alert, conversant  Skin: No rashes  Access: RUE AVF    Basic Metabolic Panel: Recent Labs  Lab 03/21/19 1200 03/23/19 0801 03/26/19 0549 03/28/19 0730  NA 134* 134* 135 134*  K 4.2 5.3* 4.2 4.3  CL 99 98 103 99  CO2 23 25 24 25   GLUCOSE 174* 235* 156* 90  BUN 74* 60* 55* 54*  CREATININE 5.26* 5.18* 5.89* 4.35*  CALCIUM 10.4* 8.4* 8.8* 8.7*  PHOS 2.2* 1.1* 1.2* 1.1*    Liver Function Tests: Recent Labs  Lab 03/21/19 1200 03/23/19 0801 03/26/19 0549 03/28/19 0730  ALBUMIN 2.1* 1.9* 1.7* 1.8*   No results for input(s): LIPASE, AMYLASE in the last 168 hours. No results for input(s): AMMONIA in the last 168 hours.  CBC: Recent Labs  Lab 03/21/19 1200 03/23/19 0801 03/26/19 0549 03/28/19 0730  WBC 11.5* 12.2* 9.9 9.1  HGB 9.3* 8.5* 7.6* 7.9*  HCT 28.7* 27.9* 24.4* 26.0*  MCV 88.9 91.8 91.4 92.2  PLT 294 302 343 339    Cardiac Enzymes: No results for input(s): CKTOTAL, CKMB, CKMBINDEX, TROPONINI in the last 168 hours.  BNP: Invalid input(s): POCBNP  CBG: No results for input(s): GLUCAP in the last 168 hours.  Microbiology: Results for orders placed or performed during the hospital encounter of 02/07/19  SARS CORONAVIRUS 2 (TAT 6-24 HRS) Nasopharyngeal Nasopharyngeal Swab     Status: None   Collection Time: 03/22/19  4:23 PM   Specimen: Nasopharyngeal  Swab  Result Value Ref Range Status   SARS Coronavirus 2 NEGATIVE NEGATIVE Final    Comment: (NOTE) SARS-CoV-2 target nucleic acids are NOT DETECTED. The SARS-CoV-2 RNA is generally detectable in upper and lower respiratory specimens during the acute phase of infection. Negative results do not preclude SARS-CoV-2 infection, do not rule out co-infections with other pathogens, and should not be used as the sole basis for treatment or other patient management decisions. Negative results must be combined with clinical observations, patient history, and epidemiological information. The expected result is Negative. Fact Sheet for Patients: SugarRoll.be Fact Sheet for Healthcare Providers: https://www.woods-mathews.com/ This test is not yet approved or cleared by the [place] FDA and  has been authorized for detection and/or diagnosis of SARS-CoV-2 by FDA under an Emergency Use Authorization (EUA). This EUA will remain  in effect (meaning this test can be used) for the duration of the COVID-19 declaration under Section 56 4(b)(1) of the Act, 21 U.S.C. section 360bbb-3(b)(1), unless the authorization is terminated or revoked sooner. Performed at [facility], [place] [address], [place], [place] [address]     Coagulation Studies: No results for input(s): LABPROT, INR in the last 72 hours.  Urinalysis: No results for input(s): COLORURINE, LABSPEC, PHURINE, GLUCOSEU, HGBUR, BILIRUBINUR, KETONESUR, PROTEINUR, UROBILINOGEN, NITRITE, LEUKOCYTESUR in the last 72 hours.  Invalid input(s): APPERANCEUR    Imaging: Dg Chest Port 1 View  Result Date: 03/27/2019 CLINICAL DATA:  PICC insertion EXAM: PORTABLE CHEST  1 VIEW COMPARISON:  03/08/2019 FINDINGS: Interval placement of a left neck vascular catheter, tip projecting over the right atrium. A previously seen right neck vascular catheter has been removed. No acute abnormality of the lungs in AP  portable projection. IMPRESSION: Interval placement of a left neck vascular catheter with tip projecting over the right atrium. Electronically Signed   By: Eddie Candle M.D.   On: 03/27/2019 17:21     Medications:       Assessment/ Plan:  38 y.o. 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   1.  ESRD on HD.    Patient seen during dialysis treatment today.  Tolerating well.  Blood flow rate 400.  Ultrafiltration target 1 kg.  2.  Anemia of chronic kidney disease.  Hemoglobin up a bit to 7.9.  Maintain the patient on Retacrit.  3.  Secondary hyperparathyroidism.  We plan to administer sodium phosphorus 20 mmol IV x1 today.  Diet has been liberalized.  Repeat serum phosphorus today.  4.  Lumbar discitis.  Patient treated with meropenem.   LOS: 0 Judithann Villamar 11/25/20208:46 AM

## 2019-03-30 LAB — RENAL FUNCTION PANEL
Albumin: 1.8 g/dL — ABNORMAL LOW (ref 3.5–5.0)
Anion gap: 12 (ref 5–15)
BUN: 54 mg/dL — ABNORMAL HIGH (ref 6–20)
CO2: 25 mmol/L (ref 22–32)
Calcium: 8.9 mg/dL (ref 8.9–10.3)
Chloride: 101 mmol/L (ref 98–111)
Creatinine, Ser: 3.97 mg/dL — ABNORMAL HIGH (ref 0.44–1.00)
GFR calc Af Amer: 15 mL/min — ABNORMAL LOW (ref >60)
GFR calc non Af Amer: 13 mL/min — ABNORMAL LOW (ref >60)
Glucose, Bld: 89 mg/dL (ref 70–99)
Phosphorus: 2.4 mg/dL — ABNORMAL LOW (ref 2.5–4.6)
Potassium: 3.8 mmol/L (ref 3.5–5.1)
Sodium: 138 mmol/L (ref 135–145)

## 2019-03-30 LAB — CBC
HCT: 23.3 % — ABNORMAL LOW (ref 36.0–46.0)
Hemoglobin: 7.2 g/dL — ABNORMAL LOW (ref 12.0–15.0)
MCH: 28 pg (ref 26.0–34.0)
MCHC: 30.9 g/dL (ref 30.0–36.0)
MCV: 90.7 fL (ref 80.0–100.0)
Platelets: 305 10*3/uL (ref 150–400)
RBC: 2.57 MIL/uL — ABNORMAL LOW (ref 3.87–5.11)
RDW: 16.8 % — ABNORMAL HIGH (ref 11.5–15.5)
WBC: 9.5 10*3/uL (ref 4.0–10.5)
nRBC: 0 % (ref 0.0–0.2)

## 2019-03-30 LAB — VANCOMYCIN, TROUGH: Vancomycin Tr: 18 ug/mL (ref 15–20)

## 2019-03-30 NOTE — Progress Notes (Signed)
[facility]  ROUNDING NOTE   Subjective:  Patient seen at bedside. Due for hemodialysis today.  Objective:  Vital signs in last 24 hours:  Temperature 98.2 pulse 114 respirations 15 blood pressure 120/72  Physical Exam: General: No acute distress  Head: Normocephalic, atraumatic. Moist oral mucosal membranes  Eyes: Anicteric  Neck: Supple, trachea midline  Lungs:  Clear to auscultation, normal effort  Heart: S1S2 no rubs  Abdomen:  Soft, nontender, bowel sounds present, colostomy in place  Extremities: No peripheral edema.  Neurologic: Awake, alert, conversant  Skin: No rashes  Access: RUE AVF    Basic Metabolic Panel: Recent Labs  Lab 03/26/19 0549 03/28/19 0730 03/30/19 0614  NA 135 134* 138  K 4.2 4.3 3.8  CL 103 99 101  CO2 24 25 25   GLUCOSE 156* 90 89  BUN 55* 54* 54*  CREATININE 5.89* 4.35* 3.97*  CALCIUM 8.8* 8.7* 8.9  PHOS 1.2* 1.1* 2.4*    Liver Function Tests: Recent Labs  Lab 03/26/19 0549 03/28/19 0730 03/30/19 0614  ALBUMIN 1.7* 1.8* 1.8*   No results for input(s): LIPASE, AMYLASE in the last 168 hours. No results for input(s): AMMONIA in the last 168 hours.  CBC: Recent Labs  Lab 03/26/19 0549 03/28/19 0730 03/30/19 0614  WBC 9.9 9.1 9.5  HGB 7.6* 7.9* 7.2*  HCT 24.4* 26.0* 23.3*  MCV 91.4 92.2 90.7  PLT 343 339 305    Cardiac Enzymes: No results for input(s): CKTOTAL, CKMB, CKMBINDEX, TROPONINI in the last 168 hours.  BNP: Invalid input(s): POCBNP  CBG: No results for input(s): GLUCAP in the last 168 hours.  Microbiology: Results for orders placed or performed during the hospital encounter of 02/07/19  SARS CORONAVIRUS 2 (TAT 6-24 HRS) Nasopharyngeal Nasopharyngeal Swab     Status: None   Collection Time: 03/22/19  4:23 PM   Specimen: Nasopharyngeal Swab  Result Value Ref Range Status   SARS Coronavirus 2 NEGATIVE NEGATIVE Final    Comment: (NOTE) SARS-CoV-2 target nucleic acids are NOT DETECTED. The  SARS-CoV-2 RNA is generally detectable in upper and lower respiratory specimens during the acute phase of infection. Negative results do not preclude SARS-CoV-2 infection, do not rule out co-infections with other pathogens, and should not be used as the sole basis for treatment or other patient management decisions. Negative results must be combined with clinical observations, patient history, and epidemiological information. The expected result is Negative. Fact Sheet for Patients: SugarRoll.be Fact Sheet for Healthcare Providers: https://www.woods-mathews.com/ This test is not yet approved or cleared by the [place] FDA and  has been authorized for detection and/or diagnosis of SARS-CoV-2 by FDA under an Emergency Use Authorization (EUA). This EUA will remain  in effect (meaning this test can be used) for the duration of the COVID-19 declaration under Section 56 4(b)(1) of the Act, 21 U.S.C. section 360bbb-3(b)(1), unless the authorization is terminated or revoked sooner. Performed at [facility], [place] [address] [place], [place] [address]     Coagulation Studies: No results for input(s): LABPROT, INR in the last 72 hours.  Urinalysis: No results for input(s): COLORURINE, LABSPEC, PHURINE, GLUCOSEU, HGBUR, BILIRUBINUR, KETONESUR, PROTEINUR, UROBILINOGEN, NITRITE, LEUKOCYTESUR in the last 72 hours.  Invalid input(s): APPERANCEUR    Imaging: No results found.   Medications:       Assessment/ Plan:  38 y.o. 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    1.  ESRD on HD.    Patient due for hemodialysis today.  Orders have been prepared.  Continue dialysis on MWF schedule.  2.  Anemia of chronic kidney disease.  Hemoglobin continues to fluctuate.  Hemoglobin currently 7.2.  Maintain the patient on Retacrit.  Consider blood transfusion for hemoglobin of 7 or less.  3.  Secondary hyperparathyroidism.  Phosphorus up to 2.4 post sodium phosphorus infusion.  Continue to monitor phosphorus closely.  4.  Lumbar discitis.  Patient treated with meropenem.   LOS: 0 Halee Glynn 11/27/20208:28 AM

## 2019-04-02 LAB — CBC
HCT: 24.8 % — ABNORMAL LOW (ref 36.0–46.0)
Hemoglobin: 7.4 g/dL — ABNORMAL LOW (ref 12.0–15.0)
MCH: 27.8 pg (ref 26.0–34.0)
MCHC: 29.8 g/dL — ABNORMAL LOW (ref 30.0–36.0)
MCV: 93.2 fL (ref 80.0–100.0)
Platelets: 407 10*3/uL — ABNORMAL HIGH (ref 150–400)
RBC: 2.66 MIL/uL — ABNORMAL LOW (ref 3.87–5.11)
RDW: 16.7 % — ABNORMAL HIGH (ref 11.5–15.5)
WBC: 8.3 10*3/uL (ref 4.0–10.5)
nRBC: 0 % (ref 0.0–0.2)

## 2019-04-02 LAB — RENAL FUNCTION PANEL
Albumin: 1.9 g/dL — ABNORMAL LOW (ref 3.5–5.0)
Anion gap: 10 (ref 5–15)
BUN: 86 mg/dL — ABNORMAL HIGH (ref 6–20)
CO2: 25 mmol/L (ref 22–32)
Calcium: 9.4 mg/dL (ref 8.9–10.3)
Chloride: 102 mmol/L (ref 98–111)
Creatinine, Ser: 4.47 mg/dL — ABNORMAL HIGH (ref 0.44–1.00)
GFR calc Af Amer: 13 mL/min — ABNORMAL LOW (ref >60)
GFR calc non Af Amer: 12 mL/min — ABNORMAL LOW (ref >60)
Glucose, Bld: 68 mg/dL — ABNORMAL LOW (ref 70–99)
Phosphorus: 3.1 mg/dL (ref 2.5–4.6)
Potassium: 5.8 mmol/L — ABNORMAL HIGH (ref 3.5–5.1)
Sodium: 137 mmol/L (ref 135–145)

## 2019-04-02 NOTE — Progress Notes (Signed)
[facility]  ROUNDING NOTE   Subjective:  Patient resting in bed comfortably at the moment. Seen and evaluated during dialysis treatment.  Ultrafiltration target 2 kg today.  Objective:  Vital signs in last 24 hours:  Temperature 98.5 pulse 101 respirations 20 blood pressure 145/103  Physical Exam: General: No acute distress  Head: Normocephalic, atraumatic. Moist oral mucosal membranes  Eyes: Anicteric  Neck: Supple, trachea midline  Lungs:  Clear to auscultation, normal effort  Heart: S1S2 no rubs  Abdomen:  Soft, nontender, bowel sounds present, colostomy in place  Extremities: No peripheral edema.  Neurologic: Awake, alert, conversant  Skin: No rashes  Access: RUE AVF    Basic Metabolic Panel: Recent Labs  Lab 03/28/19 0730 03/30/19 0614 04/02/19 0500  NA 134* 138 137  K 4.3 3.8 5.8*  CL 99 101 102  CO2 25 25 25   GLUCOSE 90 89 68*  BUN 54* 54* 86*  CREATININE 4.35* 3.97* 4.47*  CALCIUM 8.7* 8.9 9.4  PHOS 1.1* 2.4* 3.1    Liver Function Tests: Recent Labs  Lab 03/28/19 0730 03/30/19 0614 04/02/19 0500  ALBUMIN 1.8* 1.8* 1.9*   No results for input(s): LIPASE, AMYLASE in the last 168 hours. No results for input(s): AMMONIA in the last 168 hours.  CBC: Recent Labs  Lab 03/28/19 0730 03/30/19 0614 04/02/19 0500  WBC 9.1 9.5 8.3  HGB 7.9* 7.2* 7.4*  HCT 26.0* 23.3* 24.8*  MCV 92.2 90.7 93.2  PLT 339 305 407*    Cardiac Enzymes: No results for input(s): CKTOTAL, CKMB, CKMBINDEX, TROPONINI in the last 168 hours.  BNP: Invalid input(s): POCBNP  CBG: No results for input(s): GLUCAP in the last 168 hours.  Microbiology: Results for orders placed or performed during the hospital encounter of 02/07/19  SARS CORONAVIRUS 2 (TAT 6-24 HRS) Nasopharyngeal Nasopharyngeal Swab     Status: None   Collection Time: 03/22/19  4:23 PM   Specimen: Nasopharyngeal Swab  Result Value Ref Range Status   SARS Coronavirus 2 NEGATIVE NEGATIVE Final     Comment: (NOTE) SARS-CoV-2 target nucleic acids are NOT DETECTED. The SARS-CoV-2 RNA is generally detectable in upper and lower respiratory specimens during the acute phase of infection. Negative results do not preclude SARS-CoV-2 infection, do not rule out co-infections with other pathogens, and should not be used as the sole basis for treatment or other patient management decisions. Negative results must be combined with clinical observations, patient history, and epidemiological information. The expected result is Negative. Fact Sheet for Patients: SugarRoll.be Fact Sheet for Healthcare Providers: https://www.woods-mathews.com/ This test is not yet approved or cleared by the [place] FDA and  has been authorized for detection and/or diagnosis of SARS-CoV-2 by FDA under an Emergency Use Authorization (EUA). This EUA will remain  in effect (meaning this test can be used) for the duration of the COVID-19 declaration under Section 56 4(b)(1) of the Act, 21 U.S.C. section 360bbb-3(b)(1), unless the authorization is terminated or revoked sooner. Performed at [facility], [place] [address] [place], [place] [address]     Coagulation Studies: No results for input(s): LABPROT, INR in the last 72 hours.  Urinalysis: No results for input(s): COLORURINE, LABSPEC, PHURINE, GLUCOSEU, HGBUR, BILIRUBINUR, KETONESUR, PROTEINUR, UROBILINOGEN, NITRITE, LEUKOCYTESUR in the last 72 hours.  Invalid input(s): APPERANCEUR    Imaging: No results found.   Medications:       Assessment/ Plan:  38 y.o. 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   1.  ESRD on HD.    Patient seen and evaluated during dialysis treatment today.  Tolerating well.  Ultrafiltration target 1.5 kg today.  2.  Anemia of chronic kidney disease.  Hemoglobin still low at 7.4.  Maintain the patient on Retacrit.  3.  Secondary hyperparathyroidism.  Phosphorus now up to 3.1.  Continue to monitor.  4.  Lumbar discitis.  Patient treated with meropenem.   LOS: 0 Winna Golla 11/30/20208:30 AM

## 2019-04-04 LAB — CBC
HCT: 24 % — ABNORMAL LOW (ref 36.0–46.0)
Hemoglobin: 7.2 g/dL — ABNORMAL LOW (ref 12.0–15.0)
MCH: 27.7 pg (ref 26.0–34.0)
MCHC: 30 g/dL (ref 30.0–36.0)
MCV: 92.3 fL (ref 80.0–100.0)
Platelets: 354 10*3/uL (ref 150–400)
RBC: 2.6 MIL/uL — ABNORMAL LOW (ref 3.87–5.11)
RDW: 16.5 % — ABNORMAL HIGH (ref 11.5–15.5)
WBC: 8.1 10*3/uL (ref 4.0–10.5)
nRBC: 0 % (ref 0.0–0.2)

## 2019-04-04 LAB — RENAL FUNCTION PANEL
Albumin: 1.9 g/dL — ABNORMAL LOW (ref 3.5–5.0)
Anion gap: 10 (ref 5–15)
BUN: 44 mg/dL — ABNORMAL HIGH (ref 6–20)
CO2: 27 mmol/L (ref 22–32)
Calcium: 9 mg/dL (ref 8.9–10.3)
Chloride: 102 mmol/L (ref 98–111)
Creatinine, Ser: 3.43 mg/dL — ABNORMAL HIGH (ref 0.44–1.00)
GFR calc Af Amer: 18 mL/min — ABNORMAL LOW (ref >60)
GFR calc non Af Amer: 16 mL/min — ABNORMAL LOW (ref >60)
Glucose, Bld: 96 mg/dL (ref 70–99)
Phosphorus: 2.5 mg/dL (ref 2.5–4.6)
Potassium: 4.3 mmol/L (ref 3.5–5.1)
Sodium: 139 mmol/L (ref 135–145)

## 2019-04-04 LAB — VANCOMYCIN, TROUGH: Vancomycin Tr: 11 ug/mL — ABNORMAL LOW (ref 15–20)

## 2019-04-04 NOTE — Progress Notes (Signed)
[facility]  ROUNDING NOTE   Subjective:  Patient seen during dialysis treatment today. Tolerating well. Sitting up in a chair.  Objective:  Vital signs in last 24 hours:  Temperature 97.2 pulse 98 respirations 20 blood pressure 141/85  Physical Exam: General: No acute distress  Head: Normocephalic, atraumatic. Moist oral mucosal membranes  Eyes: Anicteric  Neck: Supple, trachea midline  Lungs:  Clear to auscultation, normal effort  Heart: S1S2 no rubs  Abdomen:  Soft, nontender, bowel sounds present, colostomy in place  Extremities: No peripheral edema.  Neurologic: Awake, alert, conversant  Skin: No rashes  Access: RUE AVF    Basic Metabolic Panel: Recent Labs  Lab 03/30/19 0614 04/02/19 0500 04/04/19 0725  NA 138 137 139  K 3.8 5.8* 4.3  CL 101 102 102  CO2 25 25 27   GLUCOSE 89 68* 96  BUN 54* 86* 44*  CREATININE 3.97* 4.47* 3.43*  CALCIUM 8.9 9.4 9.0  PHOS 2.4* 3.1 2.5    Liver Function Tests: Recent Labs  Lab 03/30/19 0614 04/02/19 0500 04/04/19 0725  ALBUMIN 1.8* 1.9* 1.9*   No results for input(s): LIPASE, AMYLASE in the last 168 hours. No results for input(s): AMMONIA in the last 168 hours.  CBC: Recent Labs  Lab 03/30/19 0614 04/02/19 0500 04/04/19 0725  WBC 9.5 8.3 8.1  HGB 7.2* 7.4* 7.2*  HCT 23.3* 24.8* 24.0*  MCV 90.7 93.2 92.3  PLT 305 407* 354    Cardiac Enzymes: No results for input(s): CKTOTAL, CKMB, CKMBINDEX, TROPONINI in the last 168 hours.  BNP: Invalid input(s): POCBNP  CBG: No results for input(s): GLUCAP in the last 168 hours.  Microbiology: Results for orders placed or performed during the hospital encounter of 02/07/19  SARS CORONAVIRUS 2 (TAT 6-24 HRS) Nasopharyngeal Nasopharyngeal Swab     Status: None   Collection Time: 03/22/19  4:23 PM   Specimen: Nasopharyngeal Swab  Result Value Ref Range Status   SARS Coronavirus 2 NEGATIVE NEGATIVE Final    Comment: (NOTE) SARS-CoV-2 target nucleic  acids are NOT DETECTED. The SARS-CoV-2 RNA is generally detectable in upper and lower respiratory specimens during the acute phase of infection. Negative results do not preclude SARS-CoV-2 infection, do not rule out co-infections with other pathogens, and should not be used as the sole basis for treatment or other patient management decisions. Negative results must be combined with clinical observations, patient history, and epidemiological information. The expected result is Negative. Fact Sheet for Patients: SugarRoll.be Fact Sheet for Healthcare Providers: https://www.woods-mathews.com/ This test is not yet approved or cleared by the [place] FDA and  has been authorized for detection and/or diagnosis of SARS-CoV-2 by FDA under an Emergency Use Authorization (EUA). This EUA will remain  in effect (meaning this test can be used) for the duration of the COVID-19 declaration under Section 56 4(b)(1) of the Act, 21 U.S.C. section 360bbb-3(b)(1), unless the authorization is terminated or revoked sooner. Performed at [facility], [place] [address], [place], [place] [address]     Coagulation Studies: No results for input(s): LABPROT, INR in the last 72 hours.  Urinalysis: No results for input(s): COLORURINE, LABSPEC, PHURINE, GLUCOSEU, HGBUR, BILIRUBINUR, KETONESUR, PROTEINUR, UROBILINOGEN, NITRITE, LEUKOCYTESUR in the last 72 hours.  Invalid input(s): APPERANCEUR    Imaging: No results found.   Medications:       Assessment/ Plan:  38 y.o. 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   1.  ESRD on HD.    Patient seen during dialysis treatment.  Tolerating well.  Next Alysis treatment on Friday.  2.  Anemia of chronic kidney disease.  Hemoglobin overall remains low at 7.2.  Consider blood transfusion for hemoglobin of 7 or less.  3.  Secondary hyperparathyroidism.  Phosphorus has improved from last week.  Currently 2.5.  4.  Lumbar discitis.  Patient treated with meropenem.   LOS: 0 [name] Barnett 12/2/202010:46 AM

## 2019-04-06 LAB — RENAL FUNCTION PANEL
Albumin: 1.9 g/dL — ABNORMAL LOW (ref 3.5–5.0)
Anion gap: 10 (ref 5–15)
BUN: 51 mg/dL — ABNORMAL HIGH (ref 6–20)
CO2: 25 mmol/L (ref 22–32)
Calcium: 8.9 mg/dL (ref 8.9–10.3)
Chloride: 103 mmol/L (ref 98–111)
Creatinine, Ser: 3.7 mg/dL — ABNORMAL HIGH (ref 0.44–1.00)
GFR calc Af Amer: 17 mL/min — ABNORMAL LOW (ref >60)
GFR calc non Af Amer: 14 mL/min — ABNORMAL LOW (ref >60)
Glucose, Bld: 78 mg/dL (ref 70–99)
Phosphorus: 2.9 mg/dL (ref 2.5–4.6)
Potassium: 4 mmol/L (ref 3.5–5.1)
Sodium: 138 mmol/L (ref 135–145)

## 2019-04-06 LAB — CBC
HCT: 23.4 % — ABNORMAL LOW (ref 36.0–46.0)
Hemoglobin: 7.1 g/dL — ABNORMAL LOW (ref 12.0–15.0)
MCH: 27.6 pg (ref 26.0–34.0)
MCHC: 30.3 g/dL (ref 30.0–36.0)
MCV: 91.1 fL (ref 80.0–100.0)
Platelets: 332 10*3/uL (ref 150–400)
RBC: 2.57 MIL/uL — ABNORMAL LOW (ref 3.87–5.11)
RDW: 16.2 % — ABNORMAL HIGH (ref 11.5–15.5)
WBC: 7.7 10*3/uL (ref 4.0–10.5)
nRBC: 0 % (ref 0.0–0.2)

## 2019-04-06 LAB — HEPATITIS B SURFACE ANTIGEN: Hepatitis B Surface Ag: NONREACTIVE

## 2019-04-06 NOTE — Progress Notes (Signed)
[facility]  ROUNDING NOTE   Subjective:  Patient seen and evaluated at bedside during dialysis treatment. Blood flow rate 400. Tolerating dialysis well.  Objective:  Vital signs in last 24 hours:  Temperature 96.9 pulse 95 respirations 20 blood pressure 131/90  Physical Exam: General: No acute distress  Head: Normocephalic, atraumatic. Moist oral mucosal membranes  Eyes: Anicteric  Neck: Supple, trachea midline  Lungs:  Clear to auscultation, normal effort  Heart: S1S2 no rubs  Abdomen:  Soft, nontender, bowel sounds present, colostomy in place  Extremities: No peripheral edema.  Neurologic: Awake, alert, conversant  Skin: No rashes  Access: RUE AVF    Basic Metabolic Panel: Recent Labs  Lab 04/02/19 0500 04/04/19 0725  NA 137 139  K 5.8* 4.3  CL 102 102  CO2 25 27  GLUCOSE 68* 96  BUN 86* 44*  CREATININE 4.47* 3.43*  CALCIUM 9.4 9.0  PHOS 3.1 2.5    Liver Function Tests: Recent Labs  Lab 04/02/19 0500 04/04/19 0725  ALBUMIN 1.9* 1.9*   No results for input(s): LIPASE, AMYLASE in the last 168 hours. No results for input(s): AMMONIA in the last 168 hours.  CBC: Recent Labs  Lab 04/02/19 0500 04/04/19 0725  WBC 8.3 8.1  HGB 7.4* 7.2*  HCT 24.8* 24.0*  MCV 93.2 92.3  PLT 407* 354    Cardiac Enzymes: No results for input(s): CKTOTAL, CKMB, CKMBINDEX, TROPONINI in the last 168 hours.  BNP: Invalid input(s): POCBNP  CBG: No results for input(s): GLUCAP in the last 168 hours.  Microbiology: Results for orders placed or performed during the hospital encounter of 02/07/19  SARS CORONAVIRUS 2 (TAT 6-24 HRS) Nasopharyngeal Nasopharyngeal Swab     Status: None   Collection Time: 03/22/19  4:23 PM   Specimen: Nasopharyngeal Swab  Result Value Ref Range Status   SARS Coronavirus 2 NEGATIVE NEGATIVE Final    Comment: (NOTE) SARS-CoV-2 target nucleic acids are NOT DETECTED. The SARS-CoV-2 RNA is generally detectable in upper and  lower respiratory specimens during the acute phase of infection. Negative results do not preclude SARS-CoV-2 infection, do not rule out co-infections with other pathogens, and should not be used as the sole basis for treatment or other patient management decisions. Negative results must be combined with clinical observations, patient history, and epidemiological information. The expected result is Negative. Fact Sheet for Patients: SugarRoll.be Fact Sheet for Healthcare Providers: https://www.woods-mathews.com/ This test is not yet approved or cleared by the [place] FDA and  has been authorized for detection and/or diagnosis of SARS-CoV-2 by FDA under an Emergency Use Authorization (EUA). This EUA will remain  in effect (meaning this test can be used) for the duration of the COVID-19 declaration under Section 56 4(b)(1) of the Act, 21 U.S.C. section 360bbb-3(b)(1), unless the authorization is terminated or revoked sooner. Performed at [facility], [place] [address], [place], [place] [address]     Coagulation Studies: No results for input(s): LABPROT, INR in the last 72 hours.  Urinalysis: No results for input(s): COLORURINE, LABSPEC, PHURINE, GLUCOSEU, HGBUR, BILIRUBINUR, KETONESUR, PROTEINUR, UROBILINOGEN, NITRITE, LEUKOCYTESUR in the last 72 hours.  Invalid input(s): APPERANCEUR    Imaging: No results found.   Medications:       Assessment/ Plan:  38 y.o. 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   1.  ESRD on HD.    Patient seen and evaluated  during dialysis treatment.  Blood flow rate 400.  Complete dialysis treatment today and next dialysis treatment will be on Monday.  2.  Anemia of chronic kidney disease.  Hemoglobin 7.2 at last check.  Continue to periodically monitor CBC.  Consider blood transfusion for hemoglobin of 7 or less.  3.  Secondary hyperparathyroidism.  Repeat serum phosphorus today.  4.  Lumbar discitis.  Patient treated with meropenem.   LOS: 0 Deajah Erkkila 12/4/20208:18 AM

## 2019-04-09 LAB — CBC
HCT: 23.4 % — ABNORMAL LOW (ref 36.0–46.0)
Hemoglobin: 7 g/dL — ABNORMAL LOW (ref 12.0–15.0)
MCH: 27.6 pg (ref 26.0–34.0)
MCHC: 29.9 g/dL — ABNORMAL LOW (ref 30.0–36.0)
MCV: 92.1 fL (ref 80.0–100.0)
Platelets: 424 10*3/uL — ABNORMAL HIGH (ref 150–400)
RBC: 2.54 MIL/uL — ABNORMAL LOW (ref 3.87–5.11)
RDW: 15.9 % — ABNORMAL HIGH (ref 11.5–15.5)
WBC: 11.6 10*3/uL — ABNORMAL HIGH (ref 4.0–10.5)
nRBC: 0 % (ref 0.0–0.2)

## 2019-04-09 LAB — RENAL FUNCTION PANEL
Albumin: 1.9 g/dL — ABNORMAL LOW (ref 3.5–5.0)
Anion gap: 10 (ref 5–15)
BUN: 96 mg/dL — ABNORMAL HIGH (ref 6–20)
CO2: 25 mmol/L (ref 22–32)
Calcium: 9.2 mg/dL (ref 8.9–10.3)
Chloride: 104 mmol/L (ref 98–111)
Creatinine, Ser: 4.86 mg/dL — ABNORMAL HIGH (ref 0.44–1.00)
GFR calc Af Amer: 12 mL/min — ABNORMAL LOW (ref >60)
GFR calc non Af Amer: 10 mL/min — ABNORMAL LOW (ref >60)
Glucose, Bld: 101 mg/dL — ABNORMAL HIGH (ref 70–99)
Phosphorus: 3.5 mg/dL (ref 2.5–4.6)
Potassium: 5 mmol/L (ref 3.5–5.1)
Sodium: 139 mmol/L (ref 135–145)

## 2019-04-09 NOTE — Progress Notes (Signed)
[facility]  ROUNDING NOTE   Subjective:  Patient seen during hemodialysis treatment today. Tolerating well. Blood flow rate 400. Ultrafiltration target 2.5 kg.  Objective:  Vital signs in last 24 hours:  Temperature 98.1 pulse 80 respirations 18 blood pressure 166/88  Physical Exam: General: No acute distress  Head: Normocephalic, atraumatic. Moist oral mucosal membranes  Eyes: Anicteric  Neck: Supple, trachea midline  Lungs:  Clear to auscultation, normal effort  Heart: S1S2 no rubs  Abdomen:  Soft, nontender, bowel sounds present, colostomy in place  Extremities: No peripheral edema.  Neurologic: Awake, alert, conversant  Skin: No rashes  Access: RUE AVF    Basic Metabolic Panel: Recent Labs  Lab 04/04/19 0725 04/06/19 0848 04/09/19 0642  NA 139 138 139  K 4.3 4.0 5.0  CL 102 103 104  CO2 27 25 25   GLUCOSE 96 78 101*  BUN 44* 51* 96*  CREATININE 3.43* 3.70* 4.86*  CALCIUM 9.0 8.9 9.2  PHOS 2.5 2.9 3.5    Liver Function Tests: Recent Labs  Lab 04/04/19 0725 04/06/19 0848 04/09/19 0642  ALBUMIN 1.9* 1.9* 1.9*   No results for input(s): LIPASE, AMYLASE in the last 168 hours. No results for input(s): AMMONIA in the last 168 hours.  CBC: Recent Labs  Lab 04/04/19 0725 04/06/19 0847 04/09/19 0642  WBC 8.1 7.7 11.6*  HGB 7.2* 7.1* 7.0*  HCT 24.0* 23.4* 23.4*  MCV 92.3 91.1 92.1  PLT 354 332 424*    Cardiac Enzymes: No results for input(s): CKTOTAL, CKMB, CKMBINDEX, TROPONINI in the last 168 hours.  BNP: Invalid input(s): POCBNP  CBG: No results for input(s): GLUCAP in the last 168 hours.  Microbiology: Results for orders placed or performed during the hospital encounter of 02/07/19  SARS CORONAVIRUS 2 (TAT 6-24 HRS) Nasopharyngeal Nasopharyngeal Swab     Status: None   Collection Time: 03/22/19  4:23 PM   Specimen: Nasopharyngeal Swab  Result Value Ref Range Status   SARS Coronavirus 2 NEGATIVE NEGATIVE Final    Comment:  (NOTE) SARS-CoV-2 target nucleic acids are NOT DETECTED. The SARS-CoV-2 RNA is generally detectable in upper and lower respiratory specimens during the acute phase of infection. Negative results do not preclude SARS-CoV-2 infection, do not rule out co-infections with other pathogens, and should not be used as the sole basis for treatment or other patient management decisions. Negative results must be combined with clinical observations, patient history, and epidemiological information. The expected result is Negative. Fact Sheet for Patients: SugarRoll.be Fact Sheet for Healthcare Providers: https://www.woods-mathews.com/ This test is not yet approved or cleared by the [place] FDA and  has been authorized for detection and/or diagnosis of SARS-CoV-2 by FDA under an Emergency Use Authorization (EUA). This EUA will remain  in effect (meaning this test can be used) for the duration of the COVID-19 declaration under Section 56 4(b)(1) of the Act, 21 U.S.C. section 360bbb-3(b)(1), unless the authorization is terminated or revoked sooner. Performed at [facility], [place] [address] [place], [place] [address]     Coagulation Studies: No results for input(s): LABPROT, INR in the last 72 hours.  Urinalysis: No results for input(s): COLORURINE, LABSPEC, PHURINE, GLUCOSEU, HGBUR, BILIRUBINUR, KETONESUR, PROTEINUR, UROBILINOGEN, NITRITE, LEUKOCYTESUR in the last 72 hours.  Invalid input(s): APPERANCEUR    Imaging: No results found.   Medications:       Assessment/ Plan:  38 y.o. 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   1.  ESRD on HD.    Patient seen and evaluated during hemodialysis treatment today.  Tolerating well.  We plan to complete dialysis treatment today.  Ultrafiltration target 2 kg.  2.  Anemia of chronic kidney disease.  Hemoglobin down a bit further to 7.0.  Consider blood transfusion but defer to primary team.  3.  Secondary hyperparathyroidism.  Phosphorus now up to 3.5 and has improved over the past several weeks.  4.  Lumbar discitis.  Patient treated with meropenem.   LOS: 0 Reine Bristow 12/7/20208:39 AM

## 2019-04-11 LAB — RENAL FUNCTION PANEL
Albumin: 2.1 g/dL — ABNORMAL LOW (ref 3.5–5.0)
Anion gap: 12 (ref 5–15)
BUN: 66 mg/dL — ABNORMAL HIGH (ref 6–20)
CO2: 24 mmol/L (ref 22–32)
Calcium: 9 mg/dL (ref 8.9–10.3)
Chloride: 100 mmol/L (ref 98–111)
Creatinine, Ser: 3.34 mg/dL — ABNORMAL HIGH (ref 0.44–1.00)
GFR calc Af Amer: 19 mL/min — ABNORMAL LOW (ref >60)
GFR calc non Af Amer: 16 mL/min — ABNORMAL LOW (ref >60)
Glucose, Bld: 100 mg/dL — ABNORMAL HIGH (ref 70–99)
Phosphorus: 2.7 mg/dL (ref 2.5–4.6)
Potassium: 5 mmol/L (ref 3.5–5.1)
Sodium: 136 mmol/L (ref 135–145)

## 2019-04-11 LAB — CBC
HCT: 22.4 % — ABNORMAL LOW (ref 36.0–46.0)
Hemoglobin: 7 g/dL — ABNORMAL LOW (ref 12.0–15.0)
MCH: 27.6 pg (ref 26.0–34.0)
MCHC: 31.3 g/dL (ref 30.0–36.0)
MCV: 88.2 fL (ref 80.0–100.0)
Platelets: 364 10*3/uL (ref 150–400)
RBC: 2.54 MIL/uL — ABNORMAL LOW (ref 3.87–5.11)
RDW: 15.3 % (ref 11.5–15.5)
WBC: 8.4 10*3/uL (ref 4.0–10.5)
nRBC: 0 % (ref 0.0–0.2)

## 2019-04-11 NOTE — Progress Notes (Signed)
[facility]  ROUNDING NOTE   Subjective:  Patient completed hemodialysis treatment today. Ultrafiltration achieved was 2 kg.   Objective:  Vital signs in last 24 hours:  Temperature 98.3 pulse 98 respirations 23 blood pressure 134/70  Physical Exam: General: No acute distress  Head: Normocephalic, atraumatic. Moist oral mucosal membranes  Eyes: Anicteric  Neck: Supple, trachea midline  Lungs:  Clear to auscultation, normal effort  Heart: S1S2 no rubs  Abdomen:  Soft, nontender, bowel sounds present, colostomy in place  Extremities: No peripheral edema.  Neurologic: Awake, alert, conversant  Skin: No rashes  Access: RUE AVF    Basic Metabolic Panel: Recent Labs  Lab 04/06/19 0848 04/09/19 0642 04/11/19 0500  NA 138 139 136  K 4.0 5.0 5.0  CL 103 104 100  CO2 25 25 24   GLUCOSE 78 101* 100*  BUN 51* 96* 66*  CREATININE 3.70* 4.86* 3.34*  CALCIUM 8.9 9.2 9.0  PHOS 2.9 3.5 2.7    Liver Function Tests: Recent Labs  Lab 04/06/19 0848 04/09/19 0642 04/11/19 0500  ALBUMIN 1.9* 1.9* 2.1*   No results for input(s): LIPASE, AMYLASE in the last 168 hours. No results for input(s): AMMONIA in the last 168 hours.  CBC: Recent Labs  Lab 04/06/19 0847 04/09/19 0642 04/11/19 1000  WBC 7.7 11.6* 8.4  HGB 7.1* 7.0* 7.0*  HCT 23.4* 23.4* 22.4*  MCV 91.1 92.1 88.2  PLT 332 424* 364    Cardiac Enzymes: No results for input(s): CKTOTAL, CKMB, CKMBINDEX, TROPONINI in the last 168 hours.  BNP: Invalid input(s): POCBNP  CBG: No results for input(s): GLUCAP in the last 168 hours.  Microbiology: Results for orders placed or performed during the hospital encounter of 02/07/19  SARS CORONAVIRUS 2 (TAT 6-24 HRS) Nasopharyngeal Nasopharyngeal Swab     Status: None   Collection Time: 03/22/19  4:23 PM   Specimen: Nasopharyngeal Swab  Result Value Ref Range Status   SARS Coronavirus 2 NEGATIVE NEGATIVE Final    Comment: (NOTE) SARS-CoV-2 target nucleic  acids are NOT DETECTED. The SARS-CoV-2 RNA is generally detectable in upper and lower respiratory specimens during the acute phase of infection. Negative results do not preclude SARS-CoV-2 infection, do not rule out co-infections with other pathogens, and should not be used as the sole basis for treatment or other patient management decisions. Negative results must be combined with clinical observations, patient history, and epidemiological information. The expected result is Negative. Fact Sheet for Patients: SugarRoll.be Fact Sheet for Healthcare Providers: https://www.woods-mathews.com/ This test is not yet approved or cleared by the [place] FDA and  has been authorized for detection and/or diagnosis of SARS-CoV-2 by FDA under an Emergency Use Authorization (EUA). This EUA will remain  in effect (meaning this test can be used) for the duration of the COVID-19 declaration under Section 56 4(b)(1) of the Act, 21 U.S.C. section 360bbb-3(b)(1), unless the authorization is terminated or revoked sooner. Performed at [facility], [place] [address] [place], [place] [address]     Coagulation Studies: No results for input(s): LABPROT, INR in the last 72 hours.  Urinalysis: No results for input(s): COLORURINE, LABSPEC, PHURINE, GLUCOSEU, HGBUR, BILIRUBINUR, KETONESUR, PROTEINUR, UROBILINOGEN, NITRITE, LEUKOCYTESUR in the last 72 hours.  Invalid input(s): APPERANCEUR    Imaging: No results found.   Medications:       Assessment/ Plan:  38 y.o. 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   1.  ESRD on HD.    Patient seen and evaluated post dialysis.  Tolerated well.  Ultrafiltration achieved with 2 kg.  2.  Anemia of chronic kidney disease.  Hemoglobin still low at 7.0.  Consider blood transfusion if hemoglobin drops below 7.  However defer to primary team.  Patient remains on Retacrit 10,000 units IV with dialysis treatments.  3.  Secondary hyperparathyroidism.  Phosphorus 2.7 and acceptable.  We will continue to periodically monitor.  4.  Lumbar discitis.  Patient treated with meropenem.   LOS: 0 Mell Mellott 12/9/202011:25 AM

## 2019-04-13 LAB — RENAL FUNCTION PANEL
Albumin: 2.1 g/dL — ABNORMAL LOW (ref 3.5–5.0)
Anion gap: 10 (ref 5–15)
BUN: 60 mg/dL — ABNORMAL HIGH (ref 6–20)
CO2: 25 mmol/L (ref 22–32)
Calcium: 8.7 mg/dL — ABNORMAL LOW (ref 8.9–10.3)
Chloride: 101 mmol/L (ref 98–111)
Creatinine, Ser: 3.93 mg/dL — ABNORMAL HIGH (ref 0.44–1.00)
GFR calc Af Amer: 16 mL/min — ABNORMAL LOW (ref >60)
GFR calc non Af Amer: 13 mL/min — ABNORMAL LOW (ref >60)
Glucose, Bld: 96 mg/dL (ref 70–99)
Phosphorus: 3.6 mg/dL (ref 2.5–4.6)
Potassium: 5.2 mmol/L — ABNORMAL HIGH (ref 3.5–5.1)
Sodium: 136 mmol/L (ref 135–145)

## 2019-04-13 LAB — CBC
HCT: 23.6 % — ABNORMAL LOW (ref 36.0–46.0)
Hemoglobin: 7.1 g/dL — ABNORMAL LOW (ref 12.0–15.0)
MCH: 27.2 pg (ref 26.0–34.0)
MCHC: 30.1 g/dL (ref 30.0–36.0)
MCV: 90.4 fL (ref 80.0–100.0)
Platelets: 488 10*3/uL — ABNORMAL HIGH (ref 150–400)
RBC: 2.61 MIL/uL — ABNORMAL LOW (ref 3.87–5.11)
RDW: 15.3 % (ref 11.5–15.5)
WBC: 8.7 10*3/uL (ref 4.0–10.5)
nRBC: 0 % (ref 0.0–0.2)

## 2019-04-13 NOTE — Progress Notes (Signed)
[facility]  ROUNDING NOTE   Subjective:  Patient due for hemodialysis today. Resting comfortably in bed at the moment.   Objective:  Vital signs in last 24 hours:  Temperature 96.7 pulse 71 respirations 20 blood pressure 139/80  Physical Exam: General: No acute distress  Head: Normocephalic, atraumatic. Moist oral mucosal membranes  Eyes: Anicteric  Neck: Supple, trachea midline  Lungs:  Clear to auscultation, normal effort  Heart: S1S2 no rubs  Abdomen:  Soft, nontender, bowel sounds present, colostomy in place  Extremities: No peripheral edema.  Neurologic: Awake, alert, conversant  Skin: No rashes  Access: RUE AVF    Basic Metabolic Panel: Recent Labs  Lab 04/06/19 0848 04/09/19 0642 04/11/19 0500  NA 138 139 136  K 4.0 5.0 5.0  CL 103 104 100  CO2 25 25 24   GLUCOSE 78 101* 100*  BUN 51* 96* 66*  CREATININE 3.70* 4.86* 3.34*  CALCIUM 8.9 9.2 9.0  PHOS 2.9 3.5 2.7    Liver Function Tests: Recent Labs  Lab 04/06/19 0848 04/09/19 0642 04/11/19 0500  ALBUMIN 1.9* 1.9* 2.1*   No results for input(s): LIPASE, AMYLASE in the last 168 hours. No results for input(s): AMMONIA in the last 168 hours.  CBC: Recent Labs  Lab 04/06/19 0847 04/09/19 0642 04/11/19 1000  WBC 7.7 11.6* 8.4  HGB 7.1* 7.0* 7.0*  HCT 23.4* 23.4* 22.4*  MCV 91.1 92.1 88.2  PLT 332 424* 364    Cardiac Enzymes: No results for input(s): CKTOTAL, CKMB, CKMBINDEX, TROPONINI in the last 168 hours.  BNP: Invalid input(s): POCBNP  CBG: No results for input(s): GLUCAP in the last 168 hours.  Microbiology: Results for orders placed or performed during the hospital encounter of 02/07/19  SARS CORONAVIRUS 2 (TAT 6-24 HRS) Nasopharyngeal Nasopharyngeal Swab     Status: None   Collection Time: 03/22/19  4:23 PM   Specimen: Nasopharyngeal Swab  Result Value Ref Range Status   SARS Coronavirus 2 NEGATIVE NEGATIVE Final    Comment: (NOTE) SARS-CoV-2 target nucleic acids  are NOT DETECTED. The SARS-CoV-2 RNA is generally detectable in upper and lower respiratory specimens during the acute phase of infection. Negative results do not preclude SARS-CoV-2 infection, do not rule out co-infections with other pathogens, and should not be used as the sole basis for treatment or other patient management decisions. Negative results must be combined with clinical observations, patient history, and epidemiological information. The expected result is Negative. Fact Sheet for Patients: SugarRoll.be Fact Sheet for Healthcare Providers: https://www.woods-mathews.com/ This test is not yet approved or cleared by the [place] FDA and  has been authorized for detection and/or diagnosis of SARS-CoV-2 by FDA under an Emergency Use Authorization (EUA). This EUA will remain  in effect (meaning this test can be used) for the duration of the COVID-19 declaration under Section 56 4(b)(1) of the Act, 21 U.S.C. section 360bbb-3(b)(1), unless the authorization is terminated or revoked sooner. Performed at [facility], [place] [address], [place], [place] [address]     Coagulation Studies: No results for input(s): LABPROT, INR in the last 72 hours.  Urinalysis: No results for input(s): COLORURINE, LABSPEC, PHURINE, GLUCOSEU, HGBUR, BILIRUBINUR, KETONESUR, PROTEINUR, UROBILINOGEN, NITRITE, LEUKOCYTESUR in the last 72 hours.  Invalid input(s): APPERANCEUR    Imaging: No results found.   Medications:       Assessment/ Plan:  38 y.o. 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   1.  ESRD on HD.    Patient due for hemodialysis today.  Orders have been prepared.  Thereafter next treatment will be on Monday.  2.  Anemia of chronic kidney disease.  Recommend repeating hemoglobin today.  Most recent hemoglobin was 7.0.  Has not responded very well to Retacrit.  3.  Secondary hyperparathyroidism.  Repeat serum phosphorus today.  4.  Lumbar discitis.  Patient treated with meropenem.   LOS: 0 Roxy Mastandrea 12/11/20208:14 AM

## 2019-04-16 LAB — RENAL FUNCTION PANEL
Albumin: 2.2 g/dL — ABNORMAL LOW (ref 3.5–5.0)
Anion gap: 10 (ref 5–15)
BUN: 44 mg/dL — ABNORMAL HIGH (ref 6–20)
CO2: 26 mmol/L (ref 22–32)
Calcium: 8.9 mg/dL (ref 8.9–10.3)
Chloride: 101 mmol/L (ref 98–111)
Creatinine, Ser: 4.03 mg/dL — ABNORMAL HIGH (ref 0.44–1.00)
GFR calc Af Amer: 15 mL/min — ABNORMAL LOW (ref >60)
GFR calc non Af Amer: 13 mL/min — ABNORMAL LOW (ref >60)
Glucose, Bld: 66 mg/dL — ABNORMAL LOW (ref 70–99)
Phosphorus: 3.3 mg/dL (ref 2.5–4.6)
Potassium: 4.2 mmol/L (ref 3.5–5.1)
Sodium: 137 mmol/L (ref 135–145)

## 2019-04-16 LAB — CBC
HCT: 23.3 % — ABNORMAL LOW (ref 36.0–46.0)
Hemoglobin: 7.1 g/dL — ABNORMAL LOW (ref 12.0–15.0)
MCH: 27.4 pg (ref 26.0–34.0)
MCHC: 30.5 g/dL (ref 30.0–36.0)
MCV: 90 fL (ref 80.0–100.0)
Platelets: 445 10*3/uL — ABNORMAL HIGH (ref 150–400)
RBC: 2.59 MIL/uL — ABNORMAL LOW (ref 3.87–5.11)
RDW: 15.4 % (ref 11.5–15.5)
WBC: 3.3 10*3/uL — ABNORMAL LOW (ref 4.0–10.5)
nRBC: 0 % (ref 0.0–0.2)

## 2019-04-16 NOTE — Progress Notes (Signed)
[facility]  ROUNDING NOTE   Subjective:  Patient seen and evaluated during hemodialysis. Tolerating well. UF target 2 kg.   Objective:  Vital signs in last 24 hours:  Temperature 97.3 pulse 90 respirations 18 blood pressure 136/91  Physical Exam: General: No acute distress  Head: Normocephalic, atraumatic. Moist oral mucosal membranes  Eyes: Anicteric  Neck: Supple, trachea midline  Lungs:  Clear to auscultation, normal effort  Heart: S1S2 no rubs  Abdomen:  Soft, nontender, bowel sounds present, colostomy in place  Extremities: No peripheral edema.  Neurologic: Awake, alert, conversant  Skin: No rashes  Access: RUE AVF    Basic Metabolic Panel: Recent Labs  Lab 04/11/19 0500 04/13/19 1211 04/16/19 0724  NA 136 136 137  K 5.0 5.2* 4.2  CL 100 101 101  CO2 24 25 26   GLUCOSE 100* 96 66*  BUN 66* 60* 44*  CREATININE 3.34* 3.93* 4.03*  CALCIUM 9.0 8.7* 8.9  PHOS 2.7 3.6 3.3    Liver Function Tests: Recent Labs  Lab 04/11/19 0500 04/13/19 1211 04/16/19 0724  ALBUMIN 2.1* 2.1* 2.2*   No results for input(s): LIPASE, AMYLASE in the last 168 hours. No results for input(s): AMMONIA in the last 168 hours.  CBC: Recent Labs  Lab 04/11/19 1000 04/13/19 1211 04/16/19 0724  WBC 8.4 8.7 3.3*  HGB 7.0* 7.1* 7.1*  HCT 22.4* 23.6* 23.3*  MCV 88.2 90.4 90.0  PLT 364 488* 445*    Cardiac Enzymes: No results for input(s): CKTOTAL, CKMB, CKMBINDEX, TROPONINI in the last 168 hours.  BNP: Invalid input(s): POCBNP  CBG: No results for input(s): GLUCAP in the last 168 hours.  Microbiology: Results for orders placed or performed during the hospital encounter of 02/07/19  SARS CORONAVIRUS 2 (TAT 6-24 HRS) Nasopharyngeal Nasopharyngeal Swab     Status: None   Collection Time: 03/22/19  4:23 PM   Specimen: Nasopharyngeal Swab  Result Value Ref Range Status   SARS Coronavirus 2 NEGATIVE NEGATIVE Final    Comment: (NOTE) SARS-CoV-2 target nucleic  acids are NOT DETECTED. The SARS-CoV-2 RNA is generally detectable in upper and lower respiratory specimens during the acute phase of infection. Negative results do not preclude SARS-CoV-2 infection, do not rule out co-infections with other pathogens, and should not be used as the sole basis for treatment or other patient management decisions. Negative results must be combined with clinical observations, patient history, and epidemiological information. The expected result is Negative. Fact Sheet for Patients: SugarRoll.be Fact Sheet for Healthcare Providers: https://www.woods-mathews.com/ This test is not yet approved or cleared by the [place] FDA and  has been authorized for detection and/or diagnosis of SARS-CoV-2 by FDA under an Emergency Use Authorization (EUA). This EUA will remain  in effect (meaning this test can be used) for the duration of the COVID-19 declaration under Section 56 4(b)(1) of the Act, 21 U.S.C. section 360bbb-3(b)(1), unless the authorization is terminated or revoked sooner. Performed at [facility], [place] [address] [place], [place] [address]     Coagulation Studies: No results for input(s): LABPROT, INR in the last 72 hours.  Urinalysis: No results for input(s): COLORURINE, LABSPEC, PHURINE, GLUCOSEU, HGBUR, BILIRUBINUR, KETONESUR, PROTEINUR, UROBILINOGEN, NITRITE, LEUKOCYTESUR in the last 72 hours.  Invalid input(s): APPERANCEUR    Imaging: No results found.   Medications:       Assessment/ Plan:  38 y.o. 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   1.  ESRD on HD.    Patient seen during dialysis treatment today.  Tolerating well.  UF target 2 kg.  2.  Anemia of chronic kidney disease.  Hemoglobin currently 7.1.  She is on Retacrit 10,000 IV with dialysis treatments.  3.  Secondary hyperparathyroidism.  Serum phosphorus currently 3.3 and acceptable.  4.  Lumbar discitis.  Patient treated with meropenem.  5.  Protein calorie malnutrition.  Albumin is slowly improving and currently up to 2.2.  LOS: 0 Justyn Langham 12/14/20208:46 AM

## 2019-04-18 LAB — RENAL FUNCTION PANEL
Albumin: 2.3 g/dL — ABNORMAL LOW (ref 3.5–5.0)
Anion gap: 12 (ref 5–15)
BUN: 61 mg/dL — ABNORMAL HIGH (ref 6–20)
CO2: 25 mmol/L (ref 22–32)
Calcium: 9 mg/dL (ref 8.9–10.3)
Chloride: 99 mmol/L (ref 98–111)
Creatinine, Ser: 3.96 mg/dL — ABNORMAL HIGH (ref 0.44–1.00)
GFR calc Af Amer: 15 mL/min — ABNORMAL LOW (ref >60)
GFR calc non Af Amer: 13 mL/min — ABNORMAL LOW (ref >60)
Glucose, Bld: 98 mg/dL (ref 70–99)
Phosphorus: 4 mg/dL (ref 2.5–4.6)
Potassium: 4.3 mmol/L (ref 3.5–5.1)
Sodium: 136 mmol/L (ref 135–145)

## 2019-04-18 LAB — CBC
HCT: 25.1 % — ABNORMAL LOW (ref 36.0–46.0)
Hemoglobin: 7.6 g/dL — ABNORMAL LOW (ref 12.0–15.0)
MCH: 27.3 pg (ref 26.0–34.0)
MCHC: 30.3 g/dL (ref 30.0–36.0)
MCV: 90.3 fL (ref 80.0–100.0)
Platelets: 454 10*3/uL — ABNORMAL HIGH (ref 150–400)
RBC: 2.78 MIL/uL — ABNORMAL LOW (ref 3.87–5.11)
RDW: 15.1 % (ref 11.5–15.5)
WBC: 7.4 10*3/uL (ref 4.0–10.5)
nRBC: 0 % (ref 0.0–0.2)

## 2019-04-18 NOTE — Progress Notes (Signed)
[facility]  ROUNDING NOTE   Subjective:  Patient completed dialysis today.  Tolerated well. Sitting up in chair.   Objective:  Vital signs in last 24 hours:  Temperature 96.9 pulse 103 respirations 12 blood pressure 160/75.  Physical Exam: General: No acute distress  Head: Normocephalic, atraumatic. Moist oral mucosal membranes  Eyes: Anicteric  Neck: Supple, trachea midline  Lungs:  Clear to auscultation, normal effort  Heart: S1S2 no rubs  Abdomen:  Soft, nontender, bowel sounds present, colostomy in place  Extremities: No peripheral edema.  Neurologic: Awake, alert, conversant  Skin: No rashes  Access: RUE AVF    Basic Metabolic Panel: Recent Labs  Lab 04/13/19 1211 04/16/19 0724 04/18/19 0500  NA 136 137 136  K 5.2* 4.2 4.3  CL 101 101 99  CO2 25 26 25   GLUCOSE 96 66* 98  BUN 60* 44* 61*  CREATININE 3.93* 4.03* 3.96*  CALCIUM 8.7* 8.9 9.0  PHOS 3.6 3.3 4.0    Liver Function Tests: Recent Labs  Lab 04/13/19 1211 04/16/19 0724 04/18/19 0500  ALBUMIN 2.1* 2.2* 2.3*   No results for input(s): LIPASE, AMYLASE in the last 168 hours. No results for input(s): AMMONIA in the last 168 hours.  CBC: Recent Labs  Lab 04/13/19 1211 04/16/19 0724 04/18/19 0739  WBC 8.7 3.3* 7.4  HGB 7.1* 7.1* 7.6*  HCT 23.6* 23.3* 25.1*  MCV 90.4 90.0 90.3  PLT 488* 445* 454*    Cardiac Enzymes: No results for input(s): CKTOTAL, CKMB, CKMBINDEX, TROPONINI in the last 168 hours.  BNP: Invalid input(s): POCBNP  CBG: No results for input(s): GLUCAP in the last 168 hours.  Microbiology: Results for orders placed or performed during the hospital encounter of 02/07/19  SARS CORONAVIRUS 2 (TAT 6-24 HRS) Nasopharyngeal Nasopharyngeal Swab     Status: None   Collection Time: 03/22/19  4:23 PM   Specimen: Nasopharyngeal Swab  Result Value Ref Range Status   SARS Coronavirus 2 NEGATIVE NEGATIVE Final    Comment: (NOTE) SARS-CoV-2 target nucleic acids are  NOT DETECTED. The SARS-CoV-2 RNA is generally detectable in upper and lower respiratory specimens during the acute phase of infection. Negative results do not preclude SARS-CoV-2 infection, do not rule out co-infections with other pathogens, and should not be used as the sole basis for treatment or other patient management decisions. Negative results must be combined with clinical observations, patient history, and epidemiological information. The expected result is Negative. Fact Sheet for Patients: SugarRoll.be Fact Sheet for Healthcare Providers: https://www.woods-mathews.com/ This test is not yet approved or cleared by the [place] FDA and  has been authorized for detection and/or diagnosis of SARS-CoV-2 by FDA under an Emergency Use Authorization (EUA). This EUA will remain  in effect (meaning this test can be used) for the duration of the COVID-19 declaration under Section 56 4(b)(1) of the Act, 21 U.S.C. section 360bbb-3(b)(1), unless the authorization is terminated or revoked sooner. Performed at [facility], [place] [address] [place], [place] [address]     Coagulation Studies: No results for input(s): LABPROT, INR in the last 72 hours.  Urinalysis: No results for input(s): COLORURINE, LABSPEC, PHURINE, GLUCOSEU, HGBUR, BILIRUBINUR, KETONESUR, PROTEINUR, UROBILINOGEN, NITRITE, LEUKOCYTESUR in the last 72 hours.  Invalid input(s): APPERANCEUR    Imaging: No results found.   Medications:       Assessment/ Plan:  38 y.o. 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   1.  ESRD on HD.    Patient completed dialysis today.  Tolerated well.  Next dialysis on Friday if still here.  2.  Anemia of chronic kidney disease.  Hemoglobin is up to 7.6.  Maintain the patient on Retacrit 10,000 units IV with dialysis treatments.  3.  Secondary hyperparathyroidism.  Phosphorus currently 4.0.  Not currently on binder therapy.  4.  Lumbar discitis.  Patient treated with meropenem.  5.  Protein calorie malnutrition.  Albumin continues to trend up and is currently 2.3.  Her nutrition has improved significantly over the past several weeks.  LOS: 0 [name] Barnett 12/16/202011:55 AM

## 2019-04-20 LAB — RENAL FUNCTION PANEL
Albumin: 2.6 g/dL — ABNORMAL LOW (ref 3.5–5.0)
Anion gap: 11 (ref 5–15)
BUN: 79 mg/dL — ABNORMAL HIGH (ref 6–20)
CO2: 26 mmol/L (ref 22–32)
Calcium: 9.5 mg/dL (ref 8.9–10.3)
Chloride: 99 mmol/L (ref 98–111)
Creatinine, Ser: 4.55 mg/dL — ABNORMAL HIGH (ref 0.44–1.00)
GFR calc Af Amer: 13 mL/min — ABNORMAL LOW (ref >60)
GFR calc non Af Amer: 11 mL/min — ABNORMAL LOW (ref >60)
Glucose, Bld: 142 mg/dL — ABNORMAL HIGH (ref 70–99)
Phosphorus: 3.6 mg/dL (ref 2.5–4.6)
Potassium: 4.9 mmol/L (ref 3.5–5.1)
Sodium: 136 mmol/L (ref 135–145)

## 2019-04-20 LAB — CBC
HCT: 43.6 % (ref 36.0–46.0)
Hemoglobin: 13.2 g/dL (ref 12.0–15.0)
MCH: 26.7 pg (ref 26.0–34.0)
MCHC: 30.3 g/dL (ref 30.0–36.0)
MCV: 88.3 fL (ref 80.0–100.0)
Platelets: 336 10*3/uL (ref 150–400)
RBC: 4.94 MIL/uL (ref 3.87–5.11)
RDW: 15.5 % (ref 11.5–15.5)
WBC: 6.3 10*3/uL (ref 4.0–10.5)
nRBC: 0 % (ref 0.0–0.2)

## 2019-04-20 NOTE — Progress Notes (Signed)
[facility]  ROUNDING NOTE   Subjective:  Patient seen and evaluated at bedside. Due for hemodialysis today. In good spirits today.   Objective:  Vital signs in last 24 hours:  Temperature 96.7 pulse 95 respiration 17 blood pressure 132/77  Physical Exam: General: No acute distress  Head: Normocephalic, atraumatic. Moist oral mucosal membranes  Eyes: Anicteric  Neck: Supple, trachea midline  Lungs:  Clear to auscultation, normal effort  Heart: S1S2 no rubs  Abdomen:  Soft, nontender, bowel sounds present, colostomy in place  Extremities: No peripheral edema.  Neurologic: Awake, alert, conversant  Skin: No rashes  Access: RUE AVF    Basic Metabolic Panel: Recent Labs  Lab 04/13/19 1211 04/16/19 0724 04/18/19 0500  NA 136 137 136  K 5.2* 4.2 4.3  CL 101 101 99  CO2 25 26 25   GLUCOSE 96 66* 98  BUN 60* 44* 61*  CREATININE 3.93* 4.03* 3.96*  CALCIUM 8.7* 8.9 9.0  PHOS 3.6 3.3 4.0    Liver Function Tests: Recent Labs  Lab 04/13/19 1211 04/16/19 0724 04/18/19 0500  ALBUMIN 2.1* 2.2* 2.3*   No results for input(s): LIPASE, AMYLASE in the last 168 hours. No results for input(s): AMMONIA in the last 168 hours.  CBC: Recent Labs  Lab 04/13/19 1211 04/16/19 0724 04/18/19 0739  WBC 8.7 3.3* 7.4  HGB 7.1* 7.1* 7.6*  HCT 23.6* 23.3* 25.1*  MCV 90.4 90.0 90.3  PLT 488* 445* 454*    Cardiac Enzymes: No results for input(s): CKTOTAL, CKMB, CKMBINDEX, TROPONINI in the last 168 hours.  BNP: Invalid input(s): POCBNP  CBG: No results for input(s): GLUCAP in the last 168 hours.  Microbiology: Results for orders placed or performed during the hospital encounter of 02/07/19  SARS CORONAVIRUS 2 (TAT 6-24 HRS) Nasopharyngeal Nasopharyngeal Swab     Status: None   Collection Time: 03/22/19  4:23 PM   Specimen: Nasopharyngeal Swab  Result Value Ref Range Status   SARS Coronavirus 2 NEGATIVE NEGATIVE Final    Comment: (NOTE) SARS-CoV-2 target  nucleic acids are NOT DETECTED. The SARS-CoV-2 RNA is generally detectable in upper and lower respiratory specimens during the acute phase of infection. Negative results do not preclude SARS-CoV-2 infection, do not rule out co-infections with other pathogens, and should not be used as the sole basis for treatment or other patient management decisions. Negative results must be combined with clinical observations, patient history, and epidemiological information. The expected result is Negative. Fact Sheet for Patients: SugarRoll.be Fact Sheet for Healthcare Providers: https://www.woods-mathews.com/ This test is not yet approved or cleared by the [place] FDA and  has been authorized for detection and/or diagnosis of SARS-CoV-2 by FDA under an Emergency Use Authorization (EUA). This EUA will remain  in effect (meaning this test can be used) for the duration of the COVID-19 declaration under Section 56 4(b)(1) of the Act, 21 U.S.C. section 360bbb-3(b)(1), unless the authorization is terminated or revoked sooner. Performed at [facility], [place] [address] [place], [place] [address]     Coagulation Studies: No results for input(s): LABPROT, INR in the last 72 hours.  Urinalysis: No results for input(s): COLORURINE, LABSPEC, PHURINE, GLUCOSEU, HGBUR, BILIRUBINUR, KETONESUR, PROTEINUR, UROBILINOGEN, NITRITE, LEUKOCYTESUR in the last 72 hours.  Invalid input(s): APPERANCEUR    Imaging: No results found.   Medications:       Assessment/ Plan:  38 y.o. 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   1.  ESRD on HD.    Patient due for hemodialysis today.  Orders have been prepared.  Ultrafiltration target 1.5 kg.  2.  Anemia of chronic kidney disease.  Recheck CBC today.  Maintain the patient on Retacrit.  3.  Secondary hyperparathyroidism.  Recheck serum phosphorus today.  4.  Lumbar discitis.  Patient treated with meropenem.  5.  Protein calorie malnutrition.  Albumin continues to trend up and is currently 2.3.  Her nutrition has improved significantly over the past several weeks.  LOS: 0 [name] Barnett 12/18/20208:55 AM

## 2019-04-23 LAB — RENAL FUNCTION PANEL
Albumin: 2.5 g/dL — ABNORMAL LOW (ref 3.5–5.0)
Anion gap: 11 (ref 5–15)
BUN: 78 mg/dL — ABNORMAL HIGH (ref 6–20)
CO2: 24 mmol/L (ref 22–32)
Calcium: 9.5 mg/dL (ref 8.9–10.3)
Chloride: 101 mmol/L (ref 98–111)
Creatinine, Ser: 5.87 mg/dL — ABNORMAL HIGH (ref 0.44–1.00)
GFR calc Af Amer: 10 mL/min — ABNORMAL LOW (ref >60)
GFR calc non Af Amer: 8 mL/min — ABNORMAL LOW (ref >60)
Glucose, Bld: 90 mg/dL (ref 70–99)
Phosphorus: 4.2 mg/dL (ref 2.5–4.6)
Potassium: 5.5 mmol/L — ABNORMAL HIGH (ref 3.5–5.1)
Sodium: 136 mmol/L (ref 135–145)

## 2019-04-23 LAB — CBC
HCT: 26 % — ABNORMAL LOW (ref 36.0–46.0)
Hemoglobin: 8 g/dL — ABNORMAL LOW (ref 12.0–15.0)
MCH: 27.7 pg (ref 26.0–34.0)
MCHC: 30.8 g/dL (ref 30.0–36.0)
MCV: 90 fL (ref 80.0–100.0)
Platelets: 531 10*3/uL — ABNORMAL HIGH (ref 150–400)
RBC: 2.89 MIL/uL — ABNORMAL LOW (ref 3.87–5.11)
RDW: 15.8 % — ABNORMAL HIGH (ref 11.5–15.5)
WBC: 9.5 10*3/uL (ref 4.0–10.5)
nRBC: 0 % (ref 0.0–0.2)

## 2019-04-23 NOTE — Progress Notes (Signed)
[facility]  ROUNDING NOTE   Subjective:  Patient seen and evaluated during hemodialysis. Ultrafiltration target 2 kg. Blood flow rate 400.   Objective:  Vital signs in last 24 hours:  Temperature 97.6 pulse 95 respirations 18 blood pressure 152/77  Physical Exam: General: No acute distress  Head: Normocephalic, atraumatic. Moist oral mucosal membranes  Eyes: Anicteric  Neck: Supple, trachea midline  Lungs:  Clear to auscultation, normal effort  Heart: S1S2 no rubs  Abdomen:  Soft, nontender, bowel sounds present, colostomy in place  Extremities: No peripheral edema.  Neurologic: Awake, alert, conversant  Skin: No rashes  Access: RUE AVF    Basic Metabolic Panel: Recent Labs  Lab 04/18/19 0500 04/20/19 1458  NA 136 136  K 4.3 4.9  CL 99 99  CO2 25 26  GLUCOSE 98 142*  BUN 61* 79*  CREATININE 3.96* 4.55*  CALCIUM 9.0 9.5  PHOS 4.0 3.6    Liver Function Tests: Recent Labs  Lab 04/18/19 0500 04/20/19 1458  ALBUMIN 2.3* 2.6*   No results for input(s): LIPASE, AMYLASE in the last 168 hours. No results for input(s): AMMONIA in the last 168 hours.  CBC: Recent Labs  Lab 04/18/19 0739 04/20/19 1458 04/23/19 0756  WBC 7.4 6.3 9.5  HGB 7.6* 13.2 8.0*  HCT 25.1* 43.6 26.0*  MCV 90.3 88.3 90.0  PLT 454* 336 531*    Cardiac Enzymes: No results for input(s): CKTOTAL, CKMB, CKMBINDEX, TROPONINI in the last 168 hours.  BNP: Invalid input(s): POCBNP  CBG: No results for input(s): GLUCAP in the last 168 hours.  Microbiology: Results for orders placed or performed during the hospital encounter of 02/07/19  SARS CORONAVIRUS 2 (TAT 6-24 HRS) Nasopharyngeal Nasopharyngeal Swab     Status: None   Collection Time: 03/22/19  4:23 PM   Specimen: Nasopharyngeal Swab  Result Value Ref Range Status   SARS Coronavirus 2 NEGATIVE NEGATIVE Final    Comment: (NOTE) SARS-CoV-2 target nucleic acids are NOT DETECTED. The SARS-CoV-2 RNA is generally  detectable in upper and lower respiratory specimens during the acute phase of infection. Negative results do not preclude SARS-CoV-2 infection, do not rule out co-infections with other pathogens, and should not be used as the sole basis for treatment or other patient management decisions. Negative results must be combined with clinical observations, patient history, and epidemiological information. The expected result is Negative. Fact Sheet for Patients: SugarRoll.be Fact Sheet for Healthcare Providers: https://www.woods-mathews.com/ This test is not yet approved or cleared by the [place] FDA and  has been authorized for detection and/or diagnosis of SARS-CoV-2 by FDA under an Emergency Use Authorization (EUA). This EUA will remain  in effect (meaning this test can be used) for the duration of the COVID-19 declaration under Section 56 4(b)(1) of the Act, 21 U.S.C. section 360bbb-3(b)(1), unless the authorization is terminated or revoked sooner. Performed at [facility], [place] [address], [place], [place] [address]     Coagulation Studies: No results for input(s): LABPROT, INR in the last 72 hours.  Urinalysis: No results for input(s): COLORURINE, LABSPEC, PHURINE, GLUCOSEU, HGBUR, BILIRUBINUR, KETONESUR, PROTEINUR, UROBILINOGEN, NITRITE, LEUKOCYTESUR in the last 72 hours.  Invalid input(s): APPERANCEUR    Imaging: No results found.   Medications:       Assessment/ Plan:  38 y.o. 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   1.  ESRD on HD.     Patient seen and evaluated during hemodialysis.  Tolerating well.  Ultrafiltration target 2 kg.  Next dialysis treatment on Wednesday.  2.  Anemia of chronic kidney disease.  Hemoglobin up to 8.  Maintain the patient on Retacrit.  3.  Secondary hyperparathyroidism.  Phosphorus 3.6 at last check and at target.  4.  Lumbar discitis.  Patient treated with meropenem.  5.  Protein calorie malnutrition.  Albumin improving significantly and up to 2.6.  Patient is eating eggs this a.m.  We commended her on her efforts at increasing protein intake.  LOS: 0 Adaleen Hulgan 12/21/20208:13 AM

## 2019-04-25 LAB — RENAL FUNCTION PANEL
Albumin: 2.4 g/dL — ABNORMAL LOW (ref 3.5–5.0)
Anion gap: 13 (ref 5–15)
BUN: 71 mg/dL — ABNORMAL HIGH (ref 6–20)
CO2: 25 mmol/L (ref 22–32)
Calcium: 9.5 mg/dL (ref 8.9–10.3)
Chloride: 97 mmol/L — ABNORMAL LOW (ref 98–111)
Creatinine, Ser: 4.6 mg/dL — ABNORMAL HIGH (ref 0.44–1.00)
GFR calc Af Amer: 13 mL/min — ABNORMAL LOW (ref >60)
GFR calc non Af Amer: 11 mL/min — ABNORMAL LOW (ref >60)
Glucose, Bld: 129 mg/dL — ABNORMAL HIGH (ref 70–99)
Phosphorus: 4.5 mg/dL (ref 2.5–4.6)
Potassium: 4.6 mmol/L (ref 3.5–5.1)
Sodium: 135 mmol/L (ref 135–145)

## 2019-04-25 LAB — CBC
HCT: 26.8 % — ABNORMAL LOW (ref 36.0–46.0)
Hemoglobin: 8 g/dL — ABNORMAL LOW (ref 12.0–15.0)
MCH: 27.2 pg (ref 26.0–34.0)
MCHC: 29.9 g/dL — ABNORMAL LOW (ref 30.0–36.0)
MCV: 91.2 fL (ref 80.0–100.0)
Platelets: 497 10*3/uL — ABNORMAL HIGH (ref 150–400)
RBC: 2.94 MIL/uL — ABNORMAL LOW (ref 3.87–5.11)
RDW: 15.9 % — ABNORMAL HIGH (ref 11.5–15.5)
WBC: 6.7 10*3/uL (ref 4.0–10.5)
nRBC: 0 % (ref 0.0–0.2)

## 2019-04-25 NOTE — Progress Notes (Signed)
[facility]  ROUNDING NOTE   Subjective:  Patient seen and evaluated during hemodialysis. Tolerating well. Ultrafiltration target 1.5 kg today.  Objective:  Vital signs in last 24 hours:  Temperature 96.6 pulse 100 respiration 17 blood pressure 152/87  Physical Exam: General: No acute distress  Head: Normocephalic, atraumatic. Moist oral mucosal membranes  Eyes: Anicteric  Neck: Supple, trachea midline  Lungs:  Clear to auscultation, normal effort  Heart: S1S2 no rubs  Abdomen:  Soft, nontender, bowel sounds present, colostomy in place  Extremities: No peripheral edema.  Neurologic: Awake, alert, conversant  Skin: No rashes  Access: RUE AVF    Basic Metabolic Panel: Recent Labs  Lab 04/20/19 1458 04/23/19 0756  NA 136 136  K 4.9 5.5*  CL 99 101  CO2 26 24  GLUCOSE 142* 90  BUN 79* 78*  CREATININE 4.55* 5.87*  CALCIUM 9.5 9.5  PHOS 3.6 4.2    Liver Function Tests: Recent Labs  Lab 04/20/19 1458 04/23/19 0756  ALBUMIN 2.6* 2.5*   No results for input(s): LIPASE, AMYLASE in the last 168 hours. No results for input(s): AMMONIA in the last 168 hours.  CBC: Recent Labs  Lab 04/20/19 1458 04/23/19 0756  WBC 6.3 9.5  HGB 13.2 8.0*  HCT 43.6 26.0*  MCV 88.3 90.0  PLT 336 531*    Cardiac Enzymes: No results for input(s): CKTOTAL, CKMB, CKMBINDEX, TROPONINI in the last 168 hours.  BNP: Invalid input(s): POCBNP  CBG: No results for input(s): GLUCAP in the last 168 hours.  Microbiology: Results for orders placed or performed during the hospital encounter of 02/07/19  SARS CORONAVIRUS 2 (TAT 6-24 HRS) Nasopharyngeal Nasopharyngeal Swab     Status: None   Collection Time: 03/22/19  4:23 PM   Specimen: Nasopharyngeal Swab  Result Value Ref Range Status   SARS Coronavirus 2 NEGATIVE NEGATIVE Final    Comment: (NOTE) SARS-CoV-2 target nucleic acids are NOT DETECTED. The SARS-CoV-2 RNA is generally detectable in upper and lower respiratory  specimens during the acute phase of infection. Negative results do not preclude SARS-CoV-2 infection, do not rule out co-infections with other pathogens, and should not be used as the sole basis for treatment or other patient management decisions. Negative results must be combined with clinical observations, patient history, and epidemiological information. The expected result is Negative. Fact Sheet for Patients: SugarRoll.be Fact Sheet for Healthcare Providers: https://www.woods-mathews.com/ This test is not yet approved or cleared by the [place] FDA and  has been authorized for detection and/or diagnosis of SARS-CoV-2 by FDA under an Emergency Use Authorization (EUA). This EUA will remain  in effect (meaning this test can be used) for the duration of the COVID-19 declaration under Section 56 4(b)(1) of the Act, 21 U.S.C. section 360bbb-3(b)(1), unless the authorization is terminated or revoked sooner. Performed at [facility], [place] [address], [place], [place] [address]     Coagulation Studies: No results for input(s): LABPROT, INR in the last 72 hours.  Urinalysis: No results for input(s): COLORURINE, LABSPEC, PHURINE, GLUCOSEU, HGBUR, BILIRUBINUR, KETONESUR, PROTEINUR, UROBILINOGEN, NITRITE, LEUKOCYTESUR in the last 72 hours.  Invalid input(s): APPERANCEUR    Imaging: No results found.   Medications:       Assessment/ Plan:  38 y.o. female  with a PMHx of ESRD on HD in [place], chronic diastolic heart failure, coronary artery disease status post PCI, diabetes mellitus type 2, GERD, hypertension, history of CVA June 2020 with hemiparesis, expressive aphasia, recent necrotizing fasciitis  with pressure ulcer, lumbar discitis who was admitted to [facility] on 02/07/2019 for ongoing wound care and continuation of hemodialysis services.   1.  ESRD on HD.    Patient seen during dialysis treatment this AM.   Ultrafiltration target 1.5 kg.  Next treatment on Saturday secondary to Christmas holiday.  2.  Anemia of chronic kidney disease.  Maintain the patient on Retacrit.  3.  Secondary hyperparathyroidism.  Phosphorus 4.2 and acceptable.  4.  Lumbar discitis.  Patient treated with meropenem.  5.  Protein calorie malnutrition.  Albumin has been rising recently.  Currently 2.5.  Encouraged the patient to maintain protein intake.  LOS: 0 Andrzej Scully 12/23/20208:30 AM

## 2019-04-26 NOTE — Progress Notes (Signed)
[facility]  ROUNDING NOTE   Subjective:  Patient completed dialysis yesterday.  Tolerated well.  Sitting up in chair today. Due for dialysis treatment again on Saturday.  Objective:  Vital signs in last 24 hours:  Temperature 97.6 pulse 96 respirations 28 blood pressure 152/97  Physical Exam: General: No acute distress  Head: Normocephalic, atraumatic. Moist oral mucosal membranes  Eyes: Anicteric  Neck: Supple, trachea midline  Lungs:  Clear to auscultation, normal effort  Heart: S1S2 no rubs  Abdomen:  Soft, nontender, bowel sounds present, colostomy in place  Extremities: No peripheral edema.  Neurologic: Awake, alert, conversant  Skin: No rashes  Access: RUE AVF    Basic Metabolic Panel: Recent Labs  Lab 04/20/19 1458 04/23/19 0756 04/25/19 0751  NA 136 136 135  K 4.9 5.5* 4.6  CL 99 101 97*  CO2 26 24 25   GLUCOSE 142* 90 129*  BUN 79* 78* 71*  CREATININE 4.55* 5.87* 4.60*  CALCIUM 9.5 9.5 9.5  PHOS 3.6 4.2 4.5    Liver Function Tests: Recent Labs  Lab 04/20/19 1458 04/23/19 0756 04/25/19 0751  ALBUMIN 2.6* 2.5* 2.4*   No results for input(s): LIPASE, AMYLASE in the last 168 hours. No results for input(s): AMMONIA in the last 168 hours.  CBC: Recent Labs  Lab 04/20/19 1458 04/23/19 0756 04/25/19 0751  WBC 6.3 9.5 6.7  HGB 13.2 8.0* 8.0*  HCT 43.6 26.0* 26.8*  MCV 88.3 90.0 91.2  PLT 336 531* 497*    Cardiac Enzymes: No results for input(s): CKTOTAL, CKMB, CKMBINDEX, TROPONINI in the last 168 hours.  BNP: Invalid input(s): POCBNP  CBG: No results for input(s): GLUCAP in the last 168 hours.  Microbiology: Results for orders placed or performed during the hospital encounter of 02/07/19  SARS CORONAVIRUS 2 (TAT 6-24 HRS) Nasopharyngeal Nasopharyngeal Swab     Status: None   Collection Time: 03/22/19  4:23 PM   Specimen: Nasopharyngeal Swab  Result Value Ref Range Status   SARS Coronavirus 2 NEGATIVE NEGATIVE Final   Comment: (NOTE) SARS-CoV-2 target nucleic acids are NOT DETECTED. The SARS-CoV-2 RNA is generally detectable in upper and lower respiratory specimens during the acute phase of infection. Negative results do not preclude SARS-CoV-2 infection, do not rule out co-infections with other pathogens, and should not be used as the sole basis for treatment or other patient management decisions. Negative results must be combined with clinical observations, patient history, and epidemiological information. The expected result is Negative. Fact Sheet for Patients: SugarRoll.be Fact Sheet for Healthcare Providers: https://www.woods-mathews.com/ This test is not yet approved or cleared by the [place] FDA and  has been authorized for detection and/or diagnosis of SARS-CoV-2 by FDA under an Emergency Use Authorization (EUA). This EUA will remain  in effect (meaning this test can be used) for the duration of the COVID-19 declaration under Section 56 4(b)(1) of the Act, 21 U.S.C. section 360bbb-3(b)(1), unless the authorization is terminated or revoked sooner. Performed at [facility], [place] [address], [place], [place] [address]     Coagulation Studies: No results for input(s): LABPROT, INR in the last 72 hours.  Urinalysis: No results for input(s): COLORURINE, LABSPEC, PHURINE, GLUCOSEU, HGBUR, BILIRUBINUR, KETONESUR, PROTEINUR, UROBILINOGEN, NITRITE, LEUKOCYTESUR in the last 72 hours.  Invalid input(s): APPERANCEUR    Imaging: No results found.   Medications:       Assessment/ Plan:  38 y.o. female  with a PMHx of ESRD on HD in [place], chronic diastolic  heart failure, coronary artery disease status post PCI, diabetes mellitus type 2, GERD, hypertension, history of CVA June 2020 with hemiparesis, expressive aphasia, recent necrotizing fasciitis with pressure ulcer, lumbar discitis who was admitted to [facility] on  02/07/2019 for ongoing wound care and continuation of hemodialysis services.   1.  ESRD on HD.    Patient completed dialysis yesterday.  No dialysis treatment tomorrow given Christmas schedule.  Next dose of treatment on Saturday.  2.  Anemia of chronic kidney disease.  Most recent hemoglobin was 8.0.  Maintain the patient on Retacrit.  3.  Secondary hyperparathyroidism.  Phosphorus 4.5 and at target.  Continue to monitor.  4.  Lumbar discitis.  Patient treated with meropenem.  5.  Protein calorie malnutrition.  Albumin currently 2.4.  She is taking a significant amount of protein intake..  LOS: 0 Mar Walmer 12/24/202010:54 AM

## 2019-04-28 LAB — RENAL FUNCTION PANEL
Albumin: 2.6 g/dL — ABNORMAL LOW (ref 3.5–5.0)
Anion gap: 10 (ref 5–15)
BUN: 90 mg/dL — ABNORMAL HIGH (ref 6–20)
CO2: 25 mmol/L (ref 22–32)
Calcium: 9.5 mg/dL (ref 8.9–10.3)
Chloride: 101 mmol/L (ref 98–111)
Creatinine, Ser: 5.64 mg/dL — ABNORMAL HIGH (ref 0.44–1.00)
GFR calc Af Amer: 10 mL/min — ABNORMAL LOW (ref >60)
GFR calc non Af Amer: 9 mL/min — ABNORMAL LOW (ref >60)
Glucose, Bld: 120 mg/dL — ABNORMAL HIGH (ref 70–99)
Phosphorus: 5.2 mg/dL — ABNORMAL HIGH (ref 2.5–4.6)
Potassium: 5.8 mmol/L — ABNORMAL HIGH (ref 3.5–5.1)
Sodium: 136 mmol/L (ref 135–145)

## 2019-04-28 LAB — CBC
HCT: 25.9 % — ABNORMAL LOW (ref 36.0–46.0)
Hemoglobin: 7.8 g/dL — ABNORMAL LOW (ref 12.0–15.0)
MCH: 26.6 pg (ref 26.0–34.0)
MCHC: 30.1 g/dL (ref 30.0–36.0)
MCV: 88.4 fL (ref 80.0–100.0)
Platelets: 426 10*3/uL — ABNORMAL HIGH (ref 150–400)
RBC: 2.93 MIL/uL — ABNORMAL LOW (ref 3.87–5.11)
RDW: 15.9 % — ABNORMAL HIGH (ref 11.5–15.5)
WBC: 8.2 10*3/uL (ref 4.0–10.5)
nRBC: 0 % (ref 0.0–0.2)

## 2019-04-30 LAB — RENAL FUNCTION PANEL
Albumin: 2.7 g/dL — ABNORMAL LOW (ref 3.5–5.0)
Anion gap: 13 (ref 5–15)
BUN: 70 mg/dL — ABNORMAL HIGH (ref 6–20)
CO2: 24 mmol/L (ref 22–32)
Calcium: 9.4 mg/dL (ref 8.9–10.3)
Chloride: 97 mmol/L — ABNORMAL LOW (ref 98–111)
Creatinine, Ser: 4.69 mg/dL — ABNORMAL HIGH (ref 0.44–1.00)
GFR calc Af Amer: 13 mL/min — ABNORMAL LOW (ref >60)
GFR calc non Af Amer: 11 mL/min — ABNORMAL LOW (ref >60)
Glucose, Bld: 173 mg/dL — ABNORMAL HIGH (ref 70–99)
Phosphorus: 5.3 mg/dL — ABNORMAL HIGH (ref 2.5–4.6)
Potassium: 4.7 mmol/L (ref 3.5–5.1)
Sodium: 134 mmol/L — ABNORMAL LOW (ref 135–145)

## 2019-04-30 LAB — CBC
HCT: 27.8 % — ABNORMAL LOW (ref 36.0–46.0)
Hemoglobin: 8.6 g/dL — ABNORMAL LOW (ref 12.0–15.0)
MCH: 27.4 pg (ref 26.0–34.0)
MCHC: 30.9 g/dL (ref 30.0–36.0)
MCV: 88.5 fL (ref 80.0–100.0)
Platelets: 437 10*3/uL — ABNORMAL HIGH (ref 150–400)
RBC: 3.14 MIL/uL — ABNORMAL LOW (ref 3.87–5.11)
RDW: 15.5 % (ref 11.5–15.5)
WBC: 9.4 10*3/uL (ref 4.0–10.5)
nRBC: 0 % (ref 0.0–0.2)

## 2019-04-30 NOTE — Progress Notes (Signed)
[facility]  ROUNDING NOTE   Subjective:  Patient due for hemodialysis today. Resting comfortably in bed at the moment. No acute complaints.  Objective:  Vital signs in last 24 hours:  Temperature 97.6 pulse 88 respirations 18 blood pressure 144/94  Physical Exam: General: No acute distress  Head: Normocephalic, atraumatic. Moist oral mucosal membranes  Eyes: Anicteric  Neck: Supple, trachea midline  Lungs:  Clear to auscultation, normal effort  Heart: S1S2 no rubs  Abdomen:  Soft, nontender, bowel sounds present, colostomy in place  Extremities: No peripheral edema.  Neurologic: Awake, alert, conversant  Skin: No rashes  Access: RUE AVF    Basic Metabolic Panel: Recent Labs  Lab 04/25/19 0751 04/28/19 1129  NA 135 136  K 4.6 5.8*  CL 97* 101  CO2 25 25  GLUCOSE 129* 120*  BUN 71* 90*  CREATININE 4.60* 5.64*  CALCIUM 9.5 9.5  PHOS 4.5 5.2*    Liver Function Tests: Recent Labs  Lab 04/25/19 0751 04/28/19 1129  ALBUMIN 2.4* 2.6*   No results for input(s): LIPASE, AMYLASE in the last 168 hours. No results for input(s): AMMONIA in the last 168 hours.  CBC: Recent Labs  Lab 04/25/19 0751 04/28/19 1129  WBC 6.7 8.2  HGB 8.0* 7.8*  HCT 26.8* 25.9*  MCV 91.2 88.4  PLT 497* 426*    Cardiac Enzymes: No results for input(s): CKTOTAL, CKMB, CKMBINDEX, TROPONINI in the last 168 hours.  BNP: Invalid input(s): POCBNP  CBG: No results for input(s): GLUCAP in the last 168 hours.  Microbiology: Results for orders placed or performed during the hospital encounter of 02/07/19  SARS CORONAVIRUS 2 (TAT 6-24 HRS) Nasopharyngeal Nasopharyngeal Swab     Status: None   Collection Time: 03/22/19  4:23 PM   Specimen: Nasopharyngeal Swab  Result Value Ref Range Status   SARS Coronavirus 2 NEGATIVE NEGATIVE Final    Comment: (NOTE) SARS-CoV-2 target nucleic acids are NOT DETECTED. The SARS-CoV-2 RNA is generally detectable in upper and  lower respiratory specimens during the acute phase of infection. Negative results do not preclude SARS-CoV-2 infection, do not rule out co-infections with other pathogens, and should not be used as the sole basis for treatment or other patient management decisions. Negative results must be combined with clinical observations, patient history, and epidemiological information. The expected result is Negative. Fact Sheet for Patients: SugarRoll.be Fact Sheet for Healthcare Providers: https://www.woods-mathews.com/ This test is not yet approved or cleared by the [place] FDA and  has been authorized for detection and/or diagnosis of SARS-CoV-2 by FDA under an Emergency Use Authorization (EUA). This EUA will remain  in effect (meaning this test can be used) for the duration of the COVID-19 declaration under Section 56 4(b)(1) of the Act, 21 U.S.C. section 360bbb-3(b)(1), unless the authorization is terminated or revoked sooner. Performed at [facility], [place] [address] [place], [place] [address]     Coagulation Studies: No results for input(s): LABPROT, INR in the last 72 hours.  Urinalysis: No results for input(s): COLORURINE, LABSPEC, PHURINE, GLUCOSEU, HGBUR, BILIRUBINUR, KETONESUR, PROTEINUR, UROBILINOGEN, NITRITE, LEUKOCYTESUR in the last 72 hours.  Invalid input(s): APPERANCEUR    Imaging: No results found.   Medications:       Assessment/ Plan:  38 y.o. female  with a PMHx of ESRD on HD in [place], chronic diastolic heart failure, coronary artery disease status post PCI, diabetes mellitus type 2, GERD, hypertension, history of CVA June 2020 with hemiparesis, expressive aphasia, recent  necrotizing fasciitis with pressure ulcer, lumbar discitis who was admitted to [facility] on 02/07/2019 for ongoing wound care and continuation of hemodialysis services.   1.  ESRD on HD.    Patient last had dialysis  treatment on Saturday.  Due for dialysis treatment today.  Orders have been prepared.  2.  Anemia of chronic kidney disease.  Hemoglobin 7.8 at last check.  Maintain the patient on Retacrit.  3.  Secondary hyperparathyroidism.  Phosphorus was 5.2 at last check.  Repeat this today.  4.  Lumbar discitis.  Patient treated with meropenem.  5.  Protein calorie malnutrition.  Albumin slowly rising and currently 2.6.  LOS: 0 [name] Barnett 12/28/20208:25 AM

## 2019-05-02 LAB — RENAL FUNCTION PANEL
Albumin: 2.6 g/dL — ABNORMAL LOW (ref 3.5–5.0)
Anion gap: 14 (ref 5–15)
BUN: 64 mg/dL — ABNORMAL HIGH (ref 6–20)
CO2: 24 mmol/L (ref 22–32)
Calcium: 9.3 mg/dL (ref 8.9–10.3)
Chloride: 96 mmol/L — ABNORMAL LOW (ref 98–111)
Creatinine, Ser: 4.16 mg/dL — ABNORMAL HIGH (ref 0.44–1.00)
GFR calc Af Amer: 15 mL/min — ABNORMAL LOW (ref >60)
GFR calc non Af Amer: 13 mL/min — ABNORMAL LOW (ref >60)
Glucose, Bld: 94 mg/dL (ref 70–99)
Phosphorus: 4.3 mg/dL (ref 2.5–4.6)
Potassium: 4.1 mmol/L (ref 3.5–5.1)
Sodium: 134 mmol/L — ABNORMAL LOW (ref 135–145)

## 2019-05-02 LAB — CBC
HCT: 28.9 % — ABNORMAL LOW (ref 36.0–46.0)
Hemoglobin: 8.7 g/dL — ABNORMAL LOW (ref 12.0–15.0)
MCH: 26.9 pg (ref 26.0–34.0)
MCHC: 30.1 g/dL (ref 30.0–36.0)
MCV: 89.2 fL (ref 80.0–100.0)
Platelets: 390 10*3/uL (ref 150–400)
RBC: 3.24 MIL/uL — ABNORMAL LOW (ref 3.87–5.11)
RDW: 15.2 % (ref 11.5–15.5)
WBC: 8.8 10*3/uL (ref 4.0–10.5)
nRBC: 0 % (ref 0.0–0.2)

## 2019-05-02 LAB — SARS CORONAVIRUS 2 (TAT 6-24 HRS): SARS Coronavirus 2: NEGATIVE

## 2019-05-02 NOTE — Progress Notes (Signed)
[facility]  ROUNDING NOTE   Subjective:  Patient seen and evaluated during hemodialysis. Tolerating well. Blood flow rate 400. Ultrafiltration target 1.5 kg. Unfortunately disposition remains in question.  Objective:  Vital signs in last 24 hours:  Temperature 96.4 pulse 88 respirations 20 blood pressure 122/79  Physical Exam: General: No acute distress  Head: Normocephalic, atraumatic. Moist oral mucosal membranes  Eyes: Anicteric  Neck: Supple, trachea midline  Lungs:  Clear to auscultation, normal effort  Heart: S1S2 no rubs  Abdomen:  Soft, nontender, bowel sounds present, colostomy in place  Extremities: No peripheral edema.  Neurologic: Awake, alert, conversant  Skin: No rashes  Access: RUE AVF    Basic Metabolic Panel: Recent Labs  Lab 04/28/19 1129 04/30/19 1348  NA 136 134*  K 5.8* 4.7  CL 101 97*  CO2 25 24  GLUCOSE 120* 173*  BUN 90* 70*  CREATININE 5.64* 4.69*  CALCIUM 9.5 9.4  PHOS 5.2* 5.3*    Liver Function Tests: Recent Labs  Lab 04/28/19 1129 04/30/19 1348  ALBUMIN 2.6* 2.7*   No results for input(s): LIPASE, AMYLASE in the last 168 hours. No results for input(s): AMMONIA in the last 168 hours.  CBC: Recent Labs  Lab 04/28/19 1129 04/30/19 1347  WBC 8.2 9.4  HGB 7.8* 8.6*  HCT 25.9* 27.8*  MCV 88.4 88.5  PLT 426* 437*    Cardiac Enzymes: No results for input(s): CKTOTAL, CKMB, CKMBINDEX, TROPONINI in the last 168 hours.  BNP: Invalid input(s): POCBNP  CBG: No results for input(s): GLUCAP in the last 168 hours.  Microbiology: Results for orders placed or performed during the hospital encounter of 02/07/19  SARS CORONAVIRUS 2 (TAT 6-24 HRS) Nasopharyngeal Nasopharyngeal Swab     Status: None   Collection Time: 03/22/19  4:23 PM   Specimen: Nasopharyngeal Swab  Result Value Ref Range Status   SARS Coronavirus 2 NEGATIVE NEGATIVE Final    Comment: (NOTE) SARS-CoV-2 target nucleic acids are NOT  DETECTED. The SARS-CoV-2 RNA is generally detectable in upper and lower respiratory specimens during the acute phase of infection. Negative results do not preclude SARS-CoV-2 infection, do not rule out co-infections with other pathogens, and should not be used as the sole basis for treatment or other patient management decisions. Negative results must be combined with clinical observations, patient history, and epidemiological information. The expected result is Negative. Fact Sheet for Patients: SugarRoll.be Fact Sheet for Healthcare Providers: https://www.woods-mathews.com/ This test is not yet approved or cleared by the [place] FDA and  has been authorized for detection and/or diagnosis of SARS-CoV-2 by FDA under an Emergency Use Authorization (EUA). This EUA will remain  in effect (meaning this test can be used) for the duration of the COVID-19 declaration under Section 56 4(b)(1) of the Act, 21 U.S.C. section 360bbb-3(b)(1), unless the authorization is terminated or revoked sooner. Performed at [facility], [place] [address], [place], [place] [address]     Coagulation Studies: No results for input(s): LABPROT, INR in the last 72 hours.  Urinalysis: No results for input(s): COLORURINE, LABSPEC, PHURINE, GLUCOSEU, HGBUR, BILIRUBINUR, KETONESUR, PROTEINUR, UROBILINOGEN, NITRITE, LEUKOCYTESUR in the last 72 hours.  Invalid input(s): APPERANCEUR    Imaging: No results found.   Medications:       Assessment/ Plan:  38 y.o. female  with a PMHx of ESRD on HD in [place], chronic diastolic heart failure, coronary artery disease status post PCI, diabetes mellitus type 2, GERD, hypertension, history of CVA June  2020 with hemiparesis, expressive aphasia, recent necrotizing fasciitis with pressure ulcer, lumbar discitis who was admitted to [facility] on 02/07/2019 for ongoing wound care and continuation of  hemodialysis services.   1.  ESRD on HD.    Patient seen and evaluated during hemodialysis.  Tolerating well.  We plan to complete dialysis treatment today and next treatment will be on Friday.  2.  Anemia of chronic kidney disease.  Hemoglobin up to 8.6.  Maintain the patient on Retacrit.  3.  Secondary hyperparathyroidism.  Phosphorus remains at target at 5.3.  Continue to monitor.  4.  Lumbar discitis.  Patient treated with meropenem.  5.  Protein calorie malnutrition.  Albumin improved to 2.7.  Commended the patient on her efforts at protein intake.  LOS: 0 Kiran Lapine 12/30/20208:29 AM

## 2019-05-29 ENCOUNTER — Emergency Department (HOSPITAL_COMMUNITY): Payer: Medicare Other

## 2019-05-29 ENCOUNTER — Encounter (HOSPITAL_COMMUNITY): Payer: Self-pay

## 2019-05-29 ENCOUNTER — Other Ambulatory Visit: Payer: Self-pay

## 2019-05-29 ENCOUNTER — Emergency Department (HOSPITAL_COMMUNITY)
Admission: EM | Admit: 2019-05-29 | Discharge: 2019-05-29 | Disposition: A | Discharge status: Home / Self Care | Payer: Medicare Other | Attending: Emergency Medicine | Admitting: Emergency Medicine

## 2019-05-29 DIAGNOSIS — Z992 Dependence on renal dialysis: Secondary | ICD-10-CM | POA: Diagnosis not present

## 2019-05-29 DIAGNOSIS — E1122 Type 2 diabetes mellitus with diabetic chronic kidney disease: Secondary | ICD-10-CM | POA: Diagnosis not present

## 2019-05-29 DIAGNOSIS — I120 Hypertensive chronic kidney disease with stage 5 chronic kidney disease or end stage renal disease: Secondary | ICD-10-CM | POA: Diagnosis not present

## 2019-05-29 DIAGNOSIS — N186 End stage renal disease: Secondary | ICD-10-CM | POA: Insufficient documentation

## 2019-05-29 DIAGNOSIS — J189 Pneumonia, unspecified organism: Secondary | ICD-10-CM | POA: Insufficient documentation

## 2019-05-29 DIAGNOSIS — R109 Unspecified abdominal pain: Secondary | ICD-10-CM | POA: Diagnosis present

## 2019-05-29 DIAGNOSIS — R112 Nausea with vomiting, unspecified: Secondary | ICD-10-CM | POA: Insufficient documentation

## 2019-05-29 HISTORY — DX: Hyperlipidemia, unspecified: E78.5

## 2019-05-29 HISTORY — DX: Disorder of kidney and ureter, unspecified: N28.9

## 2019-05-29 HISTORY — DX: Type 2 diabetes mellitus without complications: E11.9

## 2019-05-29 HISTORY — DX: Essential (primary) hypertension: I10

## 2019-05-29 HISTORY — DX: Vitamin D deficiency, unspecified: E55.9

## 2019-05-29 LAB — COMPREHENSIVE METABOLIC PANEL
ALT: 25 U/L (ref 0–44)
AST: 26 U/L (ref 15–41)
Albumin: 3.2 g/dL — ABNORMAL LOW (ref 3.5–5.0)
Alkaline Phosphatase: 105 U/L (ref 38–126)
Anion gap: 9 (ref 5–15)
BUN: 59 mg/dL — ABNORMAL HIGH (ref 6–20)
CO2: 28 mmol/L (ref 22–32)
Calcium: 9.2 mg/dL (ref 8.9–10.3)
Chloride: 95 mmol/L — ABNORMAL LOW (ref 98–111)
Creatinine, Ser: 5.67 mg/dL — ABNORMAL HIGH (ref 0.44–1.00)
GFR calc Af Amer: 10 mL/min — ABNORMAL LOW (ref >60)
GFR calc non Af Amer: 9 mL/min — ABNORMAL LOW (ref >60)
Glucose, Bld: 99 mg/dL (ref 70–99)
Potassium: 4.3 mmol/L (ref 3.5–5.1)
Sodium: 132 mmol/L — ABNORMAL LOW (ref 135–145)
Total Bilirubin: 0.5 mg/dL (ref 0.3–1.2)
Total Protein: 8.2 g/dL — ABNORMAL HIGH (ref 6.5–8.1)

## 2019-05-29 LAB — CBC WITH DIFFERENTIAL/PLATELET
Abs Immature Granulocytes: 0.01 10*3/uL (ref 0.00–0.07)
Basophils Absolute: 0.1 10*3/uL (ref 0.0–0.1)
Basophils Relative: 1 %
Eosinophils Absolute: 0.3 10*3/uL (ref 0.0–0.5)
Eosinophils Relative: 5 %
HCT: 34 % — ABNORMAL LOW (ref 36.0–46.0)
Hemoglobin: 10.1 g/dL — ABNORMAL LOW (ref 12.0–15.0)
Immature Granulocytes: 0 %
Lymphocytes Relative: 19 %
Lymphs Abs: 1.4 10*3/uL (ref 0.7–4.0)
MCH: 25.8 pg — ABNORMAL LOW (ref 26.0–34.0)
MCHC: 29.7 g/dL — ABNORMAL LOW (ref 30.0–36.0)
MCV: 87 fL (ref 80.0–100.0)
Monocytes Absolute: 0.4 10*3/uL (ref 0.1–1.0)
Monocytes Relative: 6 %
Neutro Abs: 5 10*3/uL (ref 1.7–7.7)
Neutrophils Relative %: 69 %
Platelets: 414 10*3/uL — ABNORMAL HIGH (ref 150–400)
RBC: 3.91 MIL/uL (ref 3.87–5.11)
RDW: 17.2 % — ABNORMAL HIGH (ref 11.5–15.5)
WBC: 7.2 10*3/uL (ref 4.0–10.5)
nRBC: 0 % (ref 0.0–0.2)

## 2019-05-29 LAB — HCG, SERUM, QUALITATIVE: Preg, Serum: NEGATIVE

## 2019-05-29 LAB — LIPASE, BLOOD: Lipase: 23 U/L (ref 11–51)

## 2019-05-29 MED ORDER — IOHEXOL 300 MG/ML  SOLN
100.0000 mL | Freq: Once | INTRAMUSCULAR | Status: AC | PRN
  Administered 2019-05-29: 100 mL via INTRAVENOUS

## 2019-05-29 MED ORDER — ONDANSETRON HCL 4 MG/2ML IJ SOLN
4.0000 mg | Freq: Once | INTRAMUSCULAR | Status: DC
  Filled 2019-05-29: qty 2

## 2019-05-29 MED ORDER — AZITHROMYCIN 250 MG PO TABS: 250.0000 mg | ORAL_TABLET | Freq: Every day | ORAL | 0 refills | Status: DC

## 2019-05-29 MED ORDER — ALUM & MAG HYDROXIDE-SIMETH 200-200-20 MG/5ML PO SUSP
15.0000 mL | Freq: Once | ORAL | Status: AC
  Administered 2019-05-29: 15 mL via ORAL
  Filled 2019-05-29: qty 30

## 2019-05-29 MED ORDER — CEFDINIR 300 MG PO CAPS: ORAL_CAPSULE | ORAL | 0 refills | Status: DC

## 2019-05-29 MED ORDER — ONDANSETRON HCL 4 MG/2ML IJ SOLN
4.0000 mg | Freq: Once | INTRAMUSCULAR | Status: AC
  Administered 2019-05-29: 4 mg via INTRAMUSCULAR

## 2019-05-29 NOTE — ED Provider Notes (Signed)
[facility] EMERGENCY DEPARTMENT Provider Note   CSN: [id number] Arrival date & time: 05/29/19  1149     History Chief Complaint  Patient presents with  . Emesis    [name] Barnett is a 39 y.o. female.  HPI   Pt is a 39 y/o female with a h/o ESRD (dialysis, T/Th/S @ [facility] in [place]), DM, HLD, HTN, who presents to the ED today for eval nausea and vomiting. States she has been 1 episode of having nonbloody, nonbilious emesis daily for the last 5 days. She reports associated abd pain near her colostomy bag. States this has happened before when she was constipated. States she does not think she is constipated but she has not had a BM in a few days. She also has had some heartburn. Denies diarrhea, bloody stools. She still urinates but not daily. Denies fevers, SOB, pleuritic pain, or coughing.   She last had dialysis complete dialysis on Friday. States [facility] refused to dialyze her today because she was vomiting.   Past Medical History:  Diagnosis Date  . Diabetes mellitus without complication ([place])   . Hyperlipidemia   . Hypertension   . Renal disorder   . Vitamin D deficiency     There are no problems to display for this patient.   Past Surgical History:  Procedure Laterality Date  . cardiac stents    . COLOSTOMY    . wound on buttocks       OB History   No obstetric history on file.     History reviewed. No pertinent family history.  Social History   Tobacco Use  . Smoking status: Never Smoker  . Smokeless tobacco: Never Used  Substance Use Topics  . Alcohol use: Never  . Drug use: Never    Home Medications Prior to Admission medications   Medication Sig Start Date End Date Taking? Authorizing Provider  azithromycin (ZITHROMAX Z-PAK) 250 MG tablet Take 1 tablet (250 mg total) by mouth daily. Take 2 tablets on the first day of treatment. Then take 1 tablet per day for the next four days. 05/29/19   Yeiden Frenkel S, PA-C  cefdinir (OMNICEF) 300 MG  capsule Take one capsule after dialysis for a total of five doses 05/29/19   Terreon Ekholm S, PA-C    Allergies    Corn-containing products, Lisinopril, and Morphine and related  Review of Systems   Review of Systems  Constitutional: Negative for fever.  HENT: Negative for ear pain and sore throat.   Eyes: Negative for visual disturbance.  Respiratory: Negative for cough and shortness of breath.   Cardiovascular: Negative for chest pain and palpitations.  Gastrointestinal: Positive for abdominal pain, nausea and vomiting. Negative for constipation and diarrhea.  Genitourinary:       Rarely makes urine  Musculoskeletal: Negative for back pain.  Skin: Negative for rash.  Neurological: Negative for seizures and syncope.  All other systems reviewed and are negative.   Physical Exam Updated Vital Signs BP (!) 146/96   Pulse 87   Temp (!) 97.5 F (36.4 C) (Oral)   Resp 16   Ht 5\' 5"  (1.651 m)   Wt 59 kg   LMP 05/22/2019   SpO2 100%   BMI 21.63 kg/m   Physical Exam Vitals and nursing note reviewed.  Constitutional:      General: She is not in acute distress.    Appearance: She is well-developed.  HENT:     Head: Normocephalic and atraumatic.  Eyes:  Conjunctiva/sclera: Conjunctivae normal.  Cardiovascular:     Rate and Rhythm: Normal rate and regular rhythm.     Pulses: Normal pulses.     Heart sounds: Normal heart sounds. No murmur.  Pulmonary:     Effort: Pulmonary effort is normal. No respiratory distress.     Breath sounds: Normal breath sounds. No wheezing, rhonchi or rales.  Abdominal:     General: Bowel sounds are increased.     Tenderness: There is abdominal tenderness (mild TTP to the epigastrium and around the colostomy site). There is no guarding or rebound.     Comments: Colostomy site is pink and easily reducible. No erythema noted.  Musculoskeletal:     Cervical back: Neck supple.  Skin:    General: Skin is warm and dry.     Comments: Large  sacral wound that appears to be healing well. No erythema, induration  Neurological:     Mental Status: She is alert.       ED Results / Procedures / Treatments   Labs (all labs ordered are listed, but only abnormal results are displayed) Labs Reviewed  CBC WITH DIFFERENTIAL/PLATELET - Abnormal; Notable for the following components:      Result Value   Hemoglobin 10.1 (*)    HCT 34.0 (*)    MCH 25.8 (*)    MCHC 29.7 (*)    RDW 17.2 (*)    Platelets 414 (*)    All other components within normal limits  COMPREHENSIVE METABOLIC PANEL - Abnormal; Notable for the following components:   Sodium 132 (*)    Chloride 95 (*)    BUN 59 (*)    Creatinine, Ser 5.67 (*)    Total Protein 8.2 (*)    Albumin 3.2 (*)    GFR calc non Af Amer 9 (*)    GFR calc Af Amer 10 (*)    All other components within normal limits  LIPASE, BLOOD  HCG, SERUM, QUALITATIVE  CBG MONITORING, ED    EKG None  Radiology CT ABDOMEN PELVIS W CONTRAST  Result Date: 05/29/2019 CLINICAL DATA:  Nausea and vomiting. EXAM: CT ABDOMEN AND PELVIS WITH CONTRAST TECHNIQUE: Multidetector CT imaging of the abdomen and pelvis was performed using the standard protocol following bolus administration of intravenous contrast. CONTRAST:  180mL OMNIPAQUE IOHEXOL 300 MG/ML  SOLN COMPARISON:  None. FINDINGS: Lower chest: Mild atelectasis and/or infiltrate is seen within the posterior aspect of the left lung base. Hepatobiliary: No focal liver abnormality is seen. No gallstones, gallbladder wall thickening, or biliary dilatation. Pancreas: Unremarkable. No pancreatic ductal dilatation or surrounding inflammatory changes. Spleen: Normal in size without focal abnormality. Adrenals/Urinary Tract: Adrenal glands are unremarkable. Kidneys are normal, without renal calculi, focal lesion, or hydronephrosis. Bladder is unremarkable. Stomach/Bowel: A left lower quadrant ostomy site is seen. Stomach is within normal limits. Appendix appears  normal. No evidence of bowel wall thickening, distention, or inflammatory changes. Vascular/Lymphatic: No significant vascular findings are present. No enlarged abdominal or pelvic lymph nodes. Reproductive: Uterus and bilateral adnexa are unremarkable. Other: A small amount of posterior pelvic fluid is noted. Musculoskeletal: A 1.0 cm thick area of low attenuation is seen along the posterior aspect of the sacrum and coccyx. This extends to the superior aspect of the gluteal fold. No adjacent osseous abnormalities are seen within the sacrum and coccyx. Multilevel degenerative changes seen within the lower lumbar spine. IMPRESSION: 1. Postoperative changes with a subsequent left lower quadrant ostomy site. 2. Mild left basilar atelectasis and/or infiltrate.  3. Findings likely consistent with moderate severity cellulitis involving the soft tissues along the posterior aspect of the sacrum and coccyx. While there is no evidence of associated acute osteomyelitis, MRI correlation is recommended. Electronically Signed   By: Virgina Norfolk M.D.   On: 05/29/2019 17:38   DG Chest Portable 1 View  Result Date: 05/29/2019 CLINICAL DATA:  Vomiting, weakness EXAM: PORTABLE CHEST 1 VIEW COMPARISON:  03/27/2019 FINDINGS: No remaining lines. Patchy atelectasis/scarring in the mid right lung. No pleural effusion. Stable cardiomediastinal contours. IMPRESSION: Patchy atelectasis/scarring in the mid right lung. Electronically Signed   By: Macy Mis M.D.   On: 05/29/2019 13:04    Procedures Procedures (including critical care time)   6:45 PM Cardiac monitoring reveals NSR HR 80s (Rate & rhythm), as reviewed and interpreted by me. Cardiac monitoring was ordered due to missed dialysis and vomiting and to monitor patient for dysrhythmia.   Medications Ordered in ED Medications  alum & mag hydroxide-simeth (MAALOX/MYLANTA) 200-200-20 MG/5ML suspension 15 mL (15 mLs Oral Given 05/29/19 1344)  ondansetron (ZOFRAN)  injection 4 mg (4 mg Intramuscular Given 05/29/19 1347)  iohexol (OMNIPAQUE) 300 MG/ML solution 100 mL (100 mLs Intravenous Contrast Given 05/29/19 1709)    ED Course  I have reviewed the triage vital signs and the nursing notes.  Pertinent labs & imaging results that were available during my care of the patient were reviewed by me and considered in my medical decision making (see chart for details).    MDM Rules/Calculators/A&P                      39 year old female presenting for evaluation of 5-day history of nausea and vomiting.  Has history of ESRD on dialysis and went to dialysis today but they have refused to dialyze her and sent her here due to her vomiting.  She has mild abdominal pain.  On arrival she is afebrile with mild hypertension but otherwise her vital signs are reassuring.  Her abdomen has some mild tenderness but she does not have any rebound or guarding no signs of infection to the ostomy site.  She does have a large sacral wound which does not appear to be acutely infected and is not significantly tender.  The patient also states that this wound is appearing better than it has been and she is currently on antibiotics for this.  Reviewed labs CBC does not show any evidence of leukocytosis, hemoglobin is 10.1 which is improved from prior.  She does have mild thrombocytosis which she appears to have a history of Mild hyponatremia and hypochloremia however potassium is within normal limits.  BUN/creatinine are elevated consistent with history of missed dialysis.  -No indication for emergent dialysis at this time based on electrolytes Lipase is normal Patient did not urinate while in the ED.  Low suspicion for urinary tract infection. Beta-hCG is negative  Chest x-ray without pulmonary edema but did show atelectasis versus scarring  CT abdomen/pelvis does not show any acute findings in the abdomen/pelvis to explain patient's symptoms.  She does have evidence of soft tissue  swelling around her sacral wound.  Findings suggestive of possible bibasilar atelectasis versus infiltrate   She has been able to tolerate p.o. in the ED and feels better upon reassessment.  With regard to sacral wound she is currently on antibiotics for this and actually states that it looks much improved.  On my evaluation clinically it does not look to be acutely infected.  There is  no crepitus, significant tenderness, or purulent drainage.  She can continue her clindamycin as an outpatient.  She did have questionable pneumonia on CT imaging, she is not really symptomatic at this time but will give her azithromycin and cefdinir to cover for this given that she is high risk.  Discussed this with Corene Cornea from pharmacy who is in agreement with this antibiotic choice.  Facility made aware.  They further state patient had negative Covid testing which resulted today.  Patient discharged in stable condition.  Final Clinical Impression(s) / ED Diagnoses Final diagnoses:  Non-intractable vomiting with nausea, unspecified vomiting type  Community acquired pneumonia, unspecified laterality    Rx / DC Orders ED Discharge Orders         Ordered    azithromycin (ZITHROMAX Z-PAK) 250 MG tablet  Daily     05/29/19 1838    cefdinir (OMNICEF) 300 MG capsule     05/29/19 1838           Rodney Booze, PA-C 05/29/19 1845    Noemi Chapel, MD 05/30/19 1756

## 2019-05-29 NOTE — Discharge Instructions (Addendum)
Take antibiotics as directed. You should still continue the clindamycin for your sacral wound.  Please follow up with your primary care provider within 3-5 days for re-evaluation of your symptoms. If you do not have a primary care provider, information for a healthcare clinic has been provided for you to make arrangements for follow up care. Please return to the emergency department for any new or worsening symptoms.

## 2019-05-29 NOTE — ED Notes (Signed)
Given Ice and diet ginger-ale upon request.

## 2019-05-29 NOTE — ED Triage Notes (Signed)
EMS reports pt is a resident of [facility] in [place].  Reports vomiting since Friday.  Denies diarrhea.  Pt was tested for covid yesterday but doesn't have the results.  REports dialysis center sent pt here.  Pt did not get dialysis today.  Last treatment was Saturday.

## 2019-05-29 NOTE — ED Provider Notes (Signed)
Medical screening examination/treatment/procedure(s) were conducted as a shared visit with non-physician practitioner(s) and myself.  I personally evaluated the patient during the encounter.  Clinical Impression:   Final diagnoses:  Non-intractable vomiting with nausea, unspecified vomiting type  Community acquired pneumonia, unspecified laterality      This patient is a 39 year old female, she is very pleasant, presents with nausea, there is been no diarrhea, she has vomited as well.  She was tested for COVID-19 yesterday but does not yet have the results and thus dialysis center here as they would not dialyze her without knowing her status.  The patient continues to be nauseated but has a soft nontender abdomen on my exam.  She is not tachycardic nor is she febrile.  Lungs are clear.  I was asked to place a peripheral IV due to poor access and the inability of nursing to obtain IV access, see procedure note below.  Ultrasound ED Peripheral IV (Provider)  Date/Time: 05/29/2019 2:09 PM Performed by: Noemi Chapel, MD Authorized by: Noemi Chapel, MD   Procedure details:    Indications: poor IV access     Skin Prep: chlorhexidine gluconate     Location: left upper arm.   Angiocath:  20 G   Bedside Ultrasound Guided: Yes     Images: not archived     Patient tolerated procedure without complications: Yes     Dressing applied: Yes   Comments:            Noemi Chapel, MD 05/30/19 1755

## 2019-05-29 NOTE — ED Notes (Signed)
Report given to Ty from [place].

## 2019-06-15 ENCOUNTER — Encounter (HOSPITAL_BASED_OUTPATIENT_CLINIC_OR_DEPARTMENT_OTHER): Payer: Medicare Other | Attending: Internal Medicine | Admitting: Internal Medicine

## 2019-06-15 ENCOUNTER — Other Ambulatory Visit: Payer: Self-pay

## 2019-06-15 DIAGNOSIS — L89154 Pressure ulcer of sacral region, stage 4: Secondary | ICD-10-CM | POA: Diagnosis not present

## 2019-06-15 DIAGNOSIS — D649 Anemia, unspecified: Secondary | ICD-10-CM | POA: Insufficient documentation

## 2019-06-15 DIAGNOSIS — F419 Anxiety disorder, unspecified: Secondary | ICD-10-CM | POA: Diagnosis not present

## 2019-06-15 DIAGNOSIS — Z841 Family history of disorders of kidney and ureter: Secondary | ICD-10-CM | POA: Diagnosis not present

## 2019-06-15 DIAGNOSIS — Z8349 Family history of other endocrine, nutritional and metabolic diseases: Secondary | ICD-10-CM | POA: Diagnosis not present

## 2019-06-15 DIAGNOSIS — Z833 Family history of diabetes mellitus: Secondary | ICD-10-CM | POA: Insufficient documentation

## 2019-06-15 DIAGNOSIS — L89152 Pressure ulcer of sacral region, stage 2: Secondary | ICD-10-CM | POA: Insufficient documentation

## 2019-06-15 DIAGNOSIS — Z992 Dependence on renal dialysis: Secondary | ICD-10-CM | POA: Diagnosis not present

## 2019-06-15 DIAGNOSIS — I69354 Hemiplegia and hemiparesis following cerebral infarction affecting left non-dominant side: Secondary | ICD-10-CM | POA: Diagnosis not present

## 2019-06-15 DIAGNOSIS — K219 Gastro-esophageal reflux disease without esophagitis: Secondary | ICD-10-CM | POA: Diagnosis not present

## 2019-06-15 DIAGNOSIS — Z885 Allergy status to narcotic agent status: Secondary | ICD-10-CM | POA: Insufficient documentation

## 2019-06-15 DIAGNOSIS — N186 End stage renal disease: Secondary | ICD-10-CM | POA: Insufficient documentation

## 2019-06-15 DIAGNOSIS — I132 Hypertensive heart and chronic kidney disease with heart failure and with stage 5 chronic kidney disease, or end stage renal disease: Secondary | ICD-10-CM | POA: Insufficient documentation

## 2019-06-15 DIAGNOSIS — Z823 Family history of stroke: Secondary | ICD-10-CM | POA: Insufficient documentation

## 2019-06-15 DIAGNOSIS — E1122 Type 2 diabetes mellitus with diabetic chronic kidney disease: Secondary | ICD-10-CM | POA: Diagnosis not present

## 2019-06-15 DIAGNOSIS — I2510 Atherosclerotic heart disease of native coronary artery without angina pectoris: Secondary | ICD-10-CM | POA: Diagnosis not present

## 2019-06-15 DIAGNOSIS — Z8249 Family history of ischemic heart disease and other diseases of the circulatory system: Secondary | ICD-10-CM | POA: Insufficient documentation

## 2019-06-15 DIAGNOSIS — E1140 Type 2 diabetes mellitus with diabetic neuropathy, unspecified: Secondary | ICD-10-CM | POA: Diagnosis present

## 2019-06-15 DIAGNOSIS — E11622 Type 2 diabetes mellitus with other skin ulcer: Secondary | ICD-10-CM | POA: Insufficient documentation

## 2019-06-15 DIAGNOSIS — Z888 Allergy status to other drugs, medicaments and biological substances status: Secondary | ICD-10-CM | POA: Insufficient documentation

## 2019-06-15 DIAGNOSIS — H409 Unspecified glaucoma: Secondary | ICD-10-CM | POA: Insufficient documentation

## 2019-06-15 DIAGNOSIS — I5032 Chronic diastolic (congestive) heart failure: Secondary | ICD-10-CM | POA: Diagnosis not present

## 2019-06-15 DIAGNOSIS — E785 Hyperlipidemia, unspecified: Secondary | ICD-10-CM | POA: Diagnosis not present

## 2019-06-15 NOTE — Progress Notes (Signed)
[name] Barnett, [name] Barnett ([id number]) Visit Report for 06/15/2019 Abuse/Suicide Risk Screen Details Patient Name: Date of Service: [name] Barnett, [name] Barnett 06/15/2019 9:00 AM Medical Record [id number] Patient Account Number: [id number] Date of Birth/Sex: Treating RN: November 13, 1980 (39 y.o. [name] Primary Care Omolola Mittman: Julianne Handler Other Clinician: Referring Montay Vanvoorhis: Treating Wendle Kina/Extender:Robson, Raymondo Band, SHARMIN Weeks in Treatment: 0 Abuse/Suicide Risk Screen Items Answer ABUSE RISK SCREEN: Has anyone close to you tried to hurt or harm you recentlyo No Do you feel uncomfortable with anyone in your familyo No Has anyone forced you do things that you didnt want to doo No Electronic Signature(s) Signed: 06/15/2019 5:30:09 PM By: [name] RN, BSN Entered By: [name] on 06/15/2019 09:32:36 -------------------------------------------------------------------------------- Activities of Daily Living Details Patient Name: Date of Service: [name] Barnett, [name] Barnett 06/15/2019 9:00 AM Medical Record [id number] Patient Account Number: [id number] Date of Birth/Sex: Treating RN: 22-Feb-1981 (39 y.o. [name] Primary Care Eufemio Strahm: Julianne Handler Other Clinician: Referring Nicholle Falzon: Treating Kimblery Diop/Extender:Robson, Raymondo Band, [facility] Weeks in Treatment: 0 Activities of Daily Living Items Answer Activities of Daily Living (Please select one for each item) Drive Automobile Not Able Take Medications Need Assistance Use Telephone Completely Able Care for Appearance Need Assistance Use Toilet Need Assistance Bath / Shower Need Assistance Dress Self Need Assistance Feed Self Completely Able Walk Need Assistance Get In / Out Bed Need Assistance Housework Not Able Prepare Meals Need Assistance Handle Money Completely Able Shop for Self Need Assistance Electronic Signature(s) Signed: 06/15/2019 5:30:09 PM By:  [name] RN, BSN Entered By: [name] on 06/15/2019 09:33:27 -------------------------------------------------------------------------------- Education Screening Details Patient Name: Date of Service: [name] Barnett, [name] Barnett 06/15/2019 9:00 AM Medical Record [id number] Patient Account Number: [id number] Date of Birth/Sex: Treating RN: 06-May-1980 (39 y.o. [name] Primary Care Bailea Beed: Julianne Handler Other Clinician: Referring Tangia Pinard: Treating Mamadou Breon/Extender:Robson, Raymondo Band, Ubaldo Glassing in Treatment: 0 Primary Learner Assessed: Patient Learning Preferences/Education Level/Primary Language Learning Preference: Explanation, Demonstration, Printed Material Highest Education Level: College or Above Preferred Language: English Cognitive Barrier Language Barrier: No Translator Needed: No Memory Deficit: No Emotional Barrier: No Cultural/Religious Beliefs Affecting Medical Care: No Physical Barrier Impaired Vision: Yes Glasses Impaired Hearing: No Decreased Hand dexterity: No Knowledge/Comprehension Knowledge Level: High Comprehension Level: High Ability to understand written High instructions: Ability to understand verbal High instructions: Motivation Anxiety Level: Calm Cooperation: Cooperative Education Importance: Acknowledges Need Interest in Health Problems: Asks Questions Perception: Coherent Willingness to Engage in Self- High Management Activities: Readiness to Engage in Self- High Management Activities: Electronic Signature(s) Signed: 06/15/2019 5:30:09 PM By: [name] RN, BSN Entered By: [name] on 06/15/2019 09:34:20 -------------------------------------------------------------------------------- Fall Risk Assessment Details Patient Name: Date of Service: [name] Barnett, [name] Barnett 06/15/2019 9:00 AM Medical Record [id number] Patient Account Number: [id number] Date of  Birth/Sex: Treating RN: 1980-08-27 (39 y.o. [name] Primary Care Alannah Averhart: Julianne Handler Other Clinician: Referring Leilany Digeronimo: Treating Wenonah Milo/Extender:Robson, Raymondo Band, [facility] Weeks in Treatment: 0 Fall Risk Assessment Items Have you had 2 or more falls in the last 12 monthso 0 Yes Have you had any fall that resulted in injury in the last 12 monthso 0 No FALLS RISK SCREEN History of falling - immediate or within 3 months 0 No Secondary diagnosis (Do you have 2 or more medical diagnoseso) 15 Yes Ambulatory aid None/bed rest/wheelchair/nurse 0 No Crutches/cane/walker 15 Yes Furniture 0 No Intravenous therapy Access/Saline/Heparin Lock 0 No Weak (short steps with or without shuffle, stooped but able to lift head 10 Yes while walking, may seek support from furniture)  Impaired (short steps with shuffle, may have difficulty arising from chair, 0 No head down, impaired balance) Mental Status Oriented to own ability 0 Yes Overestimates or forgets limitations 0 No Risk Level: Medium Risk Score: 40 Electronic Signature(s) Signed: 06/15/2019 5:30:09 PM By: [name] RN, BSN Entered By: [name] on 06/15/2019 09:35:00 -------------------------------------------------------------------------------- Foot Assessment Details Patient Name: Date of Service: [name] Barnett, [name] Barnett 06/15/2019 9:00 AM Medical Record [id number] Patient Account Number: [id number] Date of Birth/Sex: Treating RN: 16-Mar-1981 (39 y.o. [name] Primary Care Havanna Groner: Julianne Handler Other Clinician: Referring Arfa Lamarca: Treating Sabrie Moritz/Extender:Robson, Raymondo Band, SHARMIN Weeks in Treatment: 0 Foot Assessment Items Site Locations + = Sensation present, - = Sensation absent, C = Callus, U = Ulcer R = Redness, W = Warmth, M = Maceration, PU = Pre-ulcerative lesion F = Fissure, S = Swelling, D = Dryness Assessment Right: Left: Other  Deformity: No No Prior Foot Ulcer: No No Prior Amputation: No No Charcot Joint: No No Ambulatory Status: Ambulatory With Help Assistance Device: Walker Gait: Administrator, arts) Signed: 06/15/2019 5:30:09 PM By: [name] RN, BSN Entered By: [name] on 06/15/2019 09:36:13 -------------------------------------------------------------------------------- Nutrition Risk Screening Details Patient Name: Date of Service: [name] Barnett, [name] Barnett 06/15/2019 9:00 AM Medical Record [id number] Patient Account Number: [id number] Date of Birth/Sex: Treating RN: [birth year] (39 y.o. [name] Primary Care Linzy Darling: Julianne Handler Other Clinician: Referring Jionni Helming: Treating Halo Laski/Extender:Robson, Raymondo Band, SHARMIN Weeks in Treatment: 0 Height (in): 65 Weight (lbs): 130 Body Mass Index (BMI): 21.6 Nutrition Risk Screening Items Score Screening NUTRITION RISK SCREEN: I have an illness or condition that made me change the kind and/or 0 No amount of food I eat I eat fewer than two meals per day 0 No I eat few fruits and vegetables, or milk products 0 No I have three or more drinks of beer, liquor or wine almost every day 0 No I have tooth or mouth problems that make it hard for me to eat 0 No I don't always have enough money to buy the food I need 0 No I eat alone most of the time 1 Yes I take three or more different prescribed or over-the-counter drugs a day 1 Yes 0 No Without wanting to, I have lost or gained 10 pounds in the last six months I am not always physically able to shop, cook and/or feed myself 2 Yes Nutrition Protocols Good Risk Protocol Provide education on elevated blood sugars and Moderate Risk Protocol 0 impact on wound healing, as applicable High Risk Proctocol Risk Level: Moderate Risk Score: 4 Electronic Signature(s) Signed: 06/15/2019 5:30:09 PM By: [name] RN, BSN Entered By: [name] on  06/15/2019 09:35:56

## 2019-06-18 NOTE — Progress Notes (Signed)
[name] Barnett, KUNCE ([id number]) Visit Report for 06/15/2019 Debridement Details Patient Name: Date of Service: [name] Barnett, [name] Barnett 06/15/2019 9:00 AM Medical Record [id number] Patient Account Number: [id number] Date of Birth/Sex: Treating RN: [birth year] (39 y.o. [name] Primary Care Provider: Julianne Handler Other Clinician: Referring Provider: Julianne Handler Treating Provider/Extender:Aimee Heldman, Esperanza Richters in Treatment: 0 Debridement Performed for Wound #1 Sacrum Assessment: Performed By: Physician Ricard Dillon., MD Debridement Type: Debridement Level of Consciousness (Pre- Awake and Alert procedure): Pre-procedure Yes - 10:45 Verification/Time Out Taken: Start Time: 10:45 Pain Control: Other : benzocaine, 20% Total Area Debrided (L x W): 8 (cm) x 6 (cm) = 48 (cm) Tissue and other material Viable, Non-Viable, Slough, Subcutaneous, Slough debrided: Level: Skin/Subcutaneous Tissue Debridement Description: Excisional Instrument: Curette Bleeding: Moderate Hemostasis Achieved: Pressure End Time: 10:46 Procedural Pain: 0 Post Procedural Pain: 0 Response to Treatment: Procedure was tolerated well Level of Consciousness Awake and Alert (Post-procedure): Post Debridement Measurements of Total Wound Length: (cm) 8 Stage: Category/Stage IV Width: (cm) 6 Depth: (cm) 0.8 Volume: (cm) 30.159 Character of Wound/Ulcer Post Improved Debridement: Post Procedure Diagnosis Same as Pre-procedure Electronic Signature(s) Signed: 06/15/2019 5:45:04 PM By: Linton Ham MD Signed: 06/18/2019 6:12:41 PM By: Kela Millin Entered By: Kela Millin on 06/15/2019 10:47:55 -------------------------------------------------------------------------------- HPI Details Patient Name: Date of Service: [name] Barnett, [name] Barnett 06/15/2019 9:00 AM Medical Record [id number] Patient Account Number: [id number] Date of Birth/Sex: Treating  RN: Dec 07, 1980 (39 y.o. [name] Primary Care Provider: Julianne Handler Other Clinician: Referring Provider: Treating Provider/Extender:Kealan Buchan, Raymondo Band, [facility] Weeks in Treatment: 0 History of Present Illness HPI Description: Admission 06/15/2019 This is a 39 year old woman who comes to [place] from [facility] skilled facility in [place]. I am able to follow a lot of her history on care everywhere in epic. The patient is a type II diabetic and has been on dialysis. 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 I am not exactly sure of the nature of the stroke at this present time. She has made a decent recovery and is now up walking with a walker. In August 2020 she was admitted to [facility] in [place] with L4-L5 discitis. Culture and sensitivity showed Klebsiella. She was discharged on 8 weeks of ceftazidime. In mid September she is readmitted to the hospital with a pressure ulcer on her lower sacrum and necrotizing fasciitis. She required an extensive surgical debridement. CT scan of the pelvis at 1 point showed communication with the rectum. A colostomy was recommended and placed. Culture of this area showed Klebsiella and yeast. She was discharged on an extended course of meropenem and Diflucan. [facility] [place] We do not have any of these records. I think she was discharged to another nursing home but is come to [facility] skilled facility in [facility]. She was sent down here for review of the remaining wound. By review of the pictures that the patient had from September this is made considerable improvement. She tells me that the wound VAC was discontinued at the end of December 2020. Currently the facility is using a form of silver alginate to the wound surface. Past medical history type 2 diabetes with  chronic renal failure on dialysis Tuesday Thursday and Saturday. CVA with left hemiparesis in June 2020 but she appears to be making a good recovery. Chronic diastolic heart failure, hypertension Electronic Signature(s) Signed: 06/15/2019 5:45:04 PM By: Linton Ham MD  Entered By: Linton Ham on 06/15/2019 11:32:55 -------------------------------------------------------------------------------- Physical Exam Details Patient Name: Date of Service: [name] Barnett, [name] Barnett 06/15/2019 9:00 AM Medical Record [id number] Patient Account Number: [id number] Date of Birth/Sex: Treating RN: 1981-04-01 (39 y.o. [name] Primary Care Provider: Julianne Handler Other Clinician: Referring Provider: Julianne Handler Treating Provider/Extender:Renella Steig, Esperanza Richters in Treatment: 0 Constitutional Sitting or standing Blood Pressure is within target range for patient.. Pulse regular and within target range for patient.Marland Kitchen Respirations regular, non-labored and within target range.. Temperature is normal and within the target range for the patient.Marland Kitchen Appears in no distress. Respiratory work of breathing is normal. Bilateral breath sounds are clear and equal in all lobes with no wheezes, rales or rhonchi.. Cardiovascular She is slightly tachycardic with pulse of 118. Gastrointestinal (GI) Colostomy abdomen is nontender. Integumentary (Hair, Skin) Shunt in her right mid upper arm. Functioning normally. No other cutaneous issues are seen. Psychiatric appears at normal baseline. Notes Wound exam The area in question is on the lower sacrum. Large wound but considerably better than the pictures she showed me from September this is come down tremendously. Unfortunately over the surface of this is a very tight fibrinous nonviable debris. This required a very aggressive debridement using an open curette. Bleeding controlled with a pressure dressing. I was able to get to a much  better looking granulated surface. Just underneath this wound which was probably part of the initial wound is a small more superficial area probing in the upper part of the gluteal cleft towards her anus. This is seemingly closing in quite well. This area did not require debridement Electronic Signature(s) Signed: 06/15/2019 5:45:04 PM By: Linton Ham MD Entered By: Linton Ham on 06/15/2019 11:36:01 -------------------------------------------------------------------------------- Physician Orders Details Patient Name: Date of Service: [name] Barnett, [name] Barnett 06/15/2019 9:00 AM Medical Record [id number] Patient Account Number: [id number] Date of Birth/Sex: Treating RN: [birth year] (39 y.o. [name] Primary Care Provider: Julianne Handler Other Clinician: Referring Provider: Treating Provider/Extender:Fidela Cieslak, Raymondo Band, [facility] Weeks in Treatment: 0 Verbal / Phone Orders: No Diagnosis Coding Follow-up Appointments Return Appointment in 1 week. Dressing Change Frequency Wound #1 Sacrum Change Dressing every other day. Wound #2 Gluteal fold Change Dressing every other day. Skin Barriers/Peri-Wound Care Skin Prep Wound Cleansing Wound #1 Sacrum Clean wound with Wound Cleanser - or normal saline Wound #2 Gluteal fold Clean wound with Wound Cleanser - or normal saline Primary Wound Dressing Wound #1 Sacrum Hydrofera Blue Santyl Ointment - santyl to base and hydrofera blue over santyl Wound #2 Gluteal fold Hydrofera Blue Secondary Dressing Wound #1 Sacrum Dry Gauze ABD pad - and tape Wound #2 Gluteal fold Dry Gauze ABD pad - and tape Off-Loading Turn and reposition every 2 hours Electronic Signature(s) Signed: 06/15/2019 5:45:04 PM By: Linton Ham MD Signed: 06/18/2019 6:12:41 PM By: Kela Millin Entered By: Kela Millin on 06/15/2019  10:46:10 -------------------------------------------------------------------------------- Problem List Details Patient Name: Date of Service: [name] Barnett, [name] Barnett 06/15/2019 9:00 AM Medical Record [id number] Patient Account Number: [id number] Date of Birth/Sex: Treating RN: [birth year] (39 y.o. [name] Primary Care Provider: Julianne Handler Other Clinician: Referring Provider: Treating Provider/Extender:Demarlo Riojas, Raymondo Band, [facility] Weeks in Treatment: 0 Active Problems ICD-10 Evaluated Encounter Code Description Active Date Today Diagnosis L89.154 Pressure ulcer of sacral region, stage 4 06/15/2019 No Yes L89.152 Pressure ulcer of sacral region, stage 2 06/15/2019 No Yes T81.31XD Disruption of external operation (surgical) wound, not 06/15/2019 No Yes elsewhere classified, subsequent encounter M72.6 Necrotizing fasciitis 06/15/2019 No Yes Inactive Problems Resolved Problems Electronic Signature(s)  Signed: 06/15/2019 5:45:04 PM By: Linton Ham MD Entered By: Linton Ham on 06/15/2019 11:28:08 -------------------------------------------------------------------------------- Progress Note Details Patient Name: Date of Service: [name] Barnett, [name] Barnett 06/15/2019 9:00 AM Medical Record [id number] Patient Account Number: [id number] Date of Birth/Sex: Treating RN: [birth year] (39 y.o. [name] Primary Care Provider: Julianne Handler Other Clinician: Referring Provider: Treating Provider/Extender:Sahra Converse, Raymondo Band, [facility] Weeks in Treatment: 0 Subjective History of Present Illness (HPI) Admission 06/15/2019 This is a 39 year old woman who comes to [place] from [facility] skilled facility in [facility]. I am able to follow a lot of her history on care everywhere in epic. The patient is a type II diabetic and has been on dialysis. 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 I am not exactly sure of the nature of the stroke at this present time. She has made a decent recovery and is now up walking with a walker. In August 2020 she was admitted to [facility] in [place] with L4-L5 discitis. Culture and sensitivity showed Klebsiella. She was discharged on 8 weeks of ceftazidime. In mid September she is readmitted to the hospital with a pressure ulcer on her lower sacrum and necrotizing fasciitis. She required an extensive surgical debridement. CT scan of the pelvis at 1 point showed communication with the rectum. A colostomy was recommended and placed. Culture of this area showed Klebsiella and yeast. She was discharged on an extended course of meropenem and Diflucan. [facility] [place] We do not have any of these records. I think she was discharged to another nursing home but is come to [facility] skilled facility in [facility]. She was sent down here for review of the remaining wound. By review of the pictures that the patient had from September this is made considerable improvement. She tells me that the wound VAC was discontinued at the end of December 2020. Currently the facility is using a form of silver alginate to the wound surface. Past medical history type 2 diabetes with chronic renal failure on dialysis Tuesday Thursday and Saturday. CVA with left hemiparesis in June 2020 but she appears to be making a good recovery. Chronic diastolic heart failure, hypertension Patient History Information obtained from Patient. Allergies lisinopril (Severity: Moderate, Reaction: hair falls out, lip swelling), morphine (Reaction: hallucinations), corn (Reaction: throat swelling) Family History Diabetes - Maternal Grandparents, Heart Disease - Mother,Father, Hypertension - Maternal Grandparents, Kidney Disease - Maternal Grandparents, Lung Disease - Mother, Stroke -  Mother,Father, Thyroid Problems - Mother,Siblings, No family history of Cancer, Hereditary Spherocytosis, Seizures, Tuberculosis. Social History Never smoker, Marital Status - Separated, Alcohol Use - Never, Drug Use - No History, Caffeine Use - Daily - coffee. Medical History Eyes Patient has history of Cataracts, Glaucoma Ear/Nose/Mouth/Throat Denies history of Chronic sinus problems/congestion, Middle ear problems Hematologic/Lymphatic Patient has history of Anemia Cardiovascular Patient has history of Congestive Heart Failure, Coronary Artery Disease, Hypertension Endocrine Patient has history of Type II Diabetes Genitourinary Patient has history of End Stage Renal Disease - HD Integumentary (Skin) Denies history of History of Burn Musculoskeletal Denies history of Gout, Rheumatoid Arthritis, Osteoarthritis, Osteomyelitis Neurologic Patient has history of Neuropathy Oncologic Denies history of Received Chemotherapy, Received Radiation Psychiatric Patient has history of Confinement Anxiety Denies history of Anorexia/bulimia Patient is treated with Insulin. Blood sugar is tested. Hospitalization/Surgery History - right arm AV fistula. - cardiac cath with stents. - sacral wound debridement. - left  knee surgery. Medical And Surgical History Notes Cardiovascular hyperlipidemia Gastrointestinal GERD, diverting colostomy Musculoskeletal left side weakness Neurologic CVA left mild hemiplegia, lumbar discitis Psychiatric depression, anxiety Review of Systems (ROS) Constitutional Symptoms (General Health) Denies complaints or symptoms of Fatigue, Fever, Chills, Marked Weight Change. Eyes Complains or has symptoms of Glasses / Contacts. Ear/Nose/Mouth/Throat Complains or has symptoms of Chronic sinus problems or rhinitis. Respiratory Denies complaints or symptoms of Chronic or frequent coughs, Shortness of Breath. Gastrointestinal Denies complaints or symptoms of  Frequent diarrhea, Nausea, Vomiting. Integumentary (Skin) Complains or has symptoms of Wounds - sacrum. Musculoskeletal Complains or has symptoms of Muscle Weakness. Neurologic Complains or has symptoms of Numbness/parasthesias. Psychiatric Complains or has symptoms of Claustrophobia. Denies complaints or symptoms of Suicidal. Objective Constitutional Sitting or standing Blood Pressure is within target range for patient.. Pulse regular and within target range for patient.Marland Kitchen Respirations regular, non-labored and within target range.. Temperature is normal and within the target range for the patient.Marland Kitchen Appears in no distress. Vitals Time Taken: 9:15 AM, Height: 65 in, Source: Stated, Weight: 130 lbs, Source: Stated, BMI: 21.6, Temperature: 98.5 F, Pulse: 118 bpm, Respiratory Rate: 18 breaths/min, Blood Pressure: 143/87 mmHg, Capillary Blood Glucose: 150 mg/dl. General Notes: glucose per pt report this am Respiratory work of breathing is normal. Bilateral breath sounds are clear and equal in all lobes with no wheezes, rales or rhonchi.. Cardiovascular She is slightly tachycardic with pulse of 118. Gastrointestinal (GI) Colostomy abdomen is nontender. Psychiatric appears at normal baseline. General Notes: Wound exam ooThe area in question is on the lower sacrum. Large wound but considerably better than the pictures she showed me from September this is come down tremendously. Unfortunately over the surface of this is a very tight fibrinous nonviable debris. This required a very aggressive debridement using an open curette. Bleeding controlled with a pressure dressing. I was able to get to a much better looking granulated surface. ooJust underneath this wound which was probably part of the initial wound is a small more superficial area probing in the upper part of the gluteal cleft towards her anus. This is seemingly closing in quite well. This area did not  require debridement Integumentary (Hair, Skin) Shunt in her right mid upper arm. Functioning normally. No other cutaneous issues are seen. Wound #1 status is Open. Original cause of wound was Pressure Injury. The wound is located on the Sacrum. The wound measures 8cm length x 6cm width x 0.8cm depth; 37.699cm^2 area and 30.159cm^3 volume. There is Fat Layer (Subcutaneous Tissue) Exposed exposed. There is no tunneling or undermining noted. There is a large amount of serosanguineous drainage noted. The wound margin is epibole. There is small (1-33%) red granulation within the wound bed. There is a large (67-100%) amount of necrotic tissue within the wound bed including Adherent Slough. Wound #2 status is Open. Original cause of wound was Pressure Injury. The wound is located on the Gluteal fold. The wound measures 2.6cm length x 0.7cm width x 0.1cm depth; 1.429cm^2 area and 0.143cm^3 volume. There is Fat Layer (Subcutaneous Tissue) Exposed exposed. There is no tunneling or undermining noted. There is a small amount of serosanguineous drainage noted. The wound margin is flat and intact. There is large (67-100%) red, pink granulation within the wound bed. There is no necrotic tissue within the wound bed. Assessment Active Problems ICD-10 Pressure ulcer of sacral region, stage 4 Pressure ulcer of sacral region, stage 2 Disruption of external operation (surgical) wound, not elsewhere classified, subsequent encounter Necrotizing fasciitis Procedures  Wound #1 Pre-procedure diagnosis of Wound #1 is a Pressure Ulcer located on the Sacrum . There was a Excisional Skin/Subcutaneous Tissue Debridement with a total area of 48 sq cm performed by Ricard Dillon., MD. With the following instrument(s): Curette to remove Viable and Non-Viable tissue/material. Material removed includes Subcutaneous Tissue and Slough and after achieving pain control using Other (benzocaine, 20%). No specimens were taken. A  time out was conducted at 10:45, prior to the start of the procedure. A Moderate amount of bleeding was controlled with Pressure. The procedure was tolerated well with a pain level of 0 throughout and a pain level of 0 following the procedure. Post Debridement Measurements: 8cm length x 6cm width x 0.8cm depth; 30.159cm^3 volume. Post debridement Stage noted as Category/Stage IV. Character of Wound/Ulcer Post Debridement is improved. Post procedure Diagnosis Wound #1: Same as Pre-Procedure Plan Follow-up Appointments: Return Appointment in 1 week. Dressing Change Frequency: Wound #1 Sacrum: Change Dressing every other day. Wound #2 Gluteal fold: Change Dressing every other day. Skin Barriers/Peri-Wound Care: Skin Prep Wound Cleansing: Wound #1 Sacrum: Clean wound with Wound Cleanser - or normal saline Wound #2 Gluteal fold: Clean wound with Wound Cleanser - or normal saline Primary Wound Dressing: Wound #1 Sacrum: Hydrofera Blue Santyl Ointment - santyl to base and hydrofera blue over santyl Wound #2 Gluteal fold: Hydrofera Blue Secondary Dressing: Wound #1 Sacrum: Dry Gauze ABD pad - and tape Wound #2 Gluteal fold: Dry Gauze ABD pad - and tape Off-Loading: Turn and reposition every 2 hours 1. After debridement I think we should apply Santyl covered with Hydrofera Blue 2. I am expecting kickbacks from the facility about cost of these dressings although I think the combination will be helpful in preparation of the wound bed for epithelialization. 3. I see no evidence currently of infection. Nothing was cultured there is no exposed bone 4. I have asked for a comprehensive metabolic panel CBC with differential sedimentation rate and C-reactive protein. I am sure I be able to find a previous [facility]. I did not feel that any imaging studies were necessary 6. Apparently she is religiously offloading this both in the wheelchair and in bed. She is  motivated in her own care. I am not sure how much she is eating 7. We will see her back in 1 week I spent 55 minutes in extensive review of this patient's past records in care everywhere, face-to-face evaluation of the patient and preparation of this record Electronic Signature(s) Signed: 06/15/2019 5:45:04 PM By: Linton Ham MD Entered By: Linton Ham on 06/15/2019 11:38:57 -------------------------------------------------------------------------------- HxROS Details Patient Name: Date of Service: [name] Barnett, [name] Barnett 06/15/2019 9:00 AM Medical Record [id number] Patient Account Number: [id number] Date of Birth/Sex: Treating RN: Jun 07, 1980 (39 y.o. [name] Primary Care Provider: Julianne Handler Other Clinician: Referring Provider: Julianne Handler Treating Provider/Extender:Ashla Murph, Esperanza Richters in Treatment: 0 Information Obtained From Patient Constitutional Symptoms (General Health) Complaints and Symptoms: Negative for: Fatigue; Fever; Chills; Marked Weight Change Eyes Complaints and Symptoms: Positive for: Glasses / Contacts Medical History: Positive for: Cataracts; Glaucoma Ear/Nose/Mouth/Throat Complaints and Symptoms: Positive for: Chronic sinus problems or rhinitis Medical History: Negative for: Chronic sinus problems/congestion; Middle ear problems Respiratory Complaints and Symptoms: Negative for: Chronic or frequent coughs; Shortness of Breath Gastrointestinal Complaints and Symptoms: Negative for: Frequent diarrhea; Nausea; Vomiting Medical History: Past Medical History Notes: GERD, diverting colostomy Integumentary (Skin) Complaints and Symptoms: Positive for: Wounds - sacrum Medical History: Negative for: History  of Burn Musculoskeletal Complaints and Symptoms: Positive for: Muscle Weakness Medical History: Negative for: Gout; Rheumatoid Arthritis; Osteoarthritis; Osteomyelitis Past Medical History Notes: left side  weakness Neurologic Complaints and Symptoms: Positive for: Numbness/parasthesias Medical History: Positive for: Neuropathy Past Medical History Notes: CVA left mild hemiplegia, lumbar discitis Psychiatric Complaints and Symptoms: Positive for: Claustrophobia Negative for: Suicidal Medical History: Positive for: Confinement Anxiety Negative for: Anorexia/bulimia Past Medical History Notes: depression, anxiety Hematologic/Lymphatic Medical History: Positive for: Anemia Cardiovascular Medical History: Positive for: Congestive Heart Failure; Coronary Artery Disease; Hypertension Past Medical History Notes: hyperlipidemia Endocrine Medical History: Positive for: Type II Diabetes Time with diabetes: 17 yrs Treated with: Insulin Blood sugar tested every day: Yes Tested : 3 times per day Genitourinary Medical History: Positive for: End Stage Renal Disease - HD Immunological Oncologic Medical History: Negative for: Received Chemotherapy; Received Radiation HBO Extended History Items Eyes: Eyes: Cataracts Glaucoma Immunizations Pneumococcal Vaccine: Received Pneumococcal Vaccination: Yes Implantable Devices Yes Hospitalization / Surgery History Type of Hospitalization/Surgery right arm AV fistula cardiac cath with stents sacral wound debridement left knee surgery Family and Social History Cancer: No; Diabetes: Yes - Maternal Grandparents; Heart Disease: Yes - Mother,Father; Hereditary Spherocytosis: No; Hypertension: Yes - Maternal Grandparents; Kidney Disease: Yes - Maternal Grandparents; Lung Disease: Yes - Mother; Seizures: No; Stroke: Yes - Mother,Father; Thyroid Problems: Yes - Mother,Siblings; Tuberculosis: No; Never smoker; Marital Status - Separated; Alcohol Use: Never; Drug Use: No History; Caffeine Use: Daily - coffee; Financial Concerns: No; Food, Clothing or Shelter Needs: No; Support System Lacking: No; Transportation Concerns: No Electronic  Signature(s) Signed: 06/15/2019 5:30:09 PM By: [name] RN, BSN Signed: 06/15/2019 5:45:04 PM By: Linton Ham MD Entered By: [name] on 06/15/2019 09:32:16 -------------------------------------------------------------------------------- SuperBill Details Patient Name: Date of Service: [name] Barnett, [name] Barnett 06/15/2019 Medical Record [id number] Patient Account Number: [id number] Date of Birth/Sex: Treating RN: 1980-06-20 (39 y.o. [name] Primary Care Provider: Julianne Handler Other Clinician: Referring Provider: Treating Provider/Extender:Chandani Rogowski, Raymondo Band, SHARMIN Weeks in Treatment: 0 Diagnosis Coding ICD-10 Codes Code Description L89.154 Pressure ulcer of sacral region, stage 4 L89.152 Pressure ulcer of sacral region, stage 2 Disruption of external operation (surgical) wound, not elsewhere classified, subsequent T81.31XD encounter M72.6 Necrotizing fasciitis Facility Procedures CPT4 Code: [id number] Description: [place] VISIT-LEV 3 EST PT Modifier: 25 Quantity: 1 CPT4 Code: [id number] Description: [id number] - DEB SUBQ TISSUE 20 SQ CM/< ICD-10 Diagnosis Description L89.154 Pressure ulcer of sacral region, stage 4 Modifier: Quantity: 1 CPT4 Code: [id number] Description: [id number] - DEB SUBQ TISS EA ADDL 20CM ICD-10 Diagnosis Description L89.154 Pressure ulcer of sacral region, stage 4 Modifier: Quantity: 2 Physician Procedures CPT4: Code [id number] Description: [id number] - WC PHYS LEVEL 5 - NEW PT ICD-10 Diagnosis Description L89.154 Pressure ulcer of sacral region, stage 4 L89.152 Pressure ulcer of sacral region, stage 2 T81.31XD Disruption of external operation (surgical) wound, not els subsequent  encounter M72.6 Necrotizing fasciitis Modifier: 25 ewhere classifi Quantity: 1 ed, [id number] Description: [id number] - WC PHYS SUBQ TISS 20 SQ CM ICD-10 Diagnosis Description L89.154 Pressure ulcer of sacral region, stage  4 Modifier: Quantity: 1 CPT4: [id number] Description: [id number] - WC PHYS SUBQ TISS EA ADDL 20 CM ICD-10 Diagnosis Description 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

## 2019-06-18 NOTE — Progress Notes (Signed)
[name] LUJUANA, COPENHAVER ([id number]) Visit Report for 06/15/2019 Allergy List Details Patient Name: Date of Service: [name] Barnett, [name] Barnett 06/15/2019 9:00 AM Medical Record [id number] Patient Account Number: [id number] Date of Birth/Sex: Treating RN: [birth year] (39 y.o. [name] Primary Care Rannie Craney: Julianne Handler Other Clinician: Referring Rajon Bisig: Treating Analys Ryden/Extender:Robson, Raymondo Band, [facility] Weeks in Treatment: 0 Allergies Active Allergies lisinopril Reaction: hair falls out, lip swelling Severity: Moderate morphine Reaction: hallucinations corn Reaction: throat swelling Allergy Notes Electronic Signature(s) Signed: 06/15/2019 5:30:09 PM By: [name] RN, BSN Entered By: [name] on 06/15/2019 09:18:14 -------------------------------------------------------------------------------- Arrival Information Details Patient Name: Date of Service: [name] DANIELS, [place] 06/15/2019 9:00 AM Medical Record [id number] Patient Account Number: [id number] Date of Birth/Sex: Treating RN: 1981/02/19 (39 y.o. [name] Primary Care Rilynn Habel: Julianne Handler Other Clinician: Referring Daaron Dimarco: Treating Cacie Gaskins/Extender:Robson, Raymondo Band, [facility] Weeks in Treatment: 0 Visit Information Patient Arrived: Wheel Chair Arrival Time: 09:06 Accompanied By: facility staff Transfer Assistance: EasyPivot Patient Lift Patient Lift Patient Identification Verified: Yes Secondary Verification Process Yes Completed: Patient Requires Transmission-Based No Precautions: Patient Has Alerts: No Electronic Signature(s) Signed: 06/15/2019 5:30:09 PM By: [name] RN, BSN Entered By: [name] on 06/15/2019 09:09:20 -------------------------------------------------------------------------------- Clinic Level of Care Assessment Details Patient Name: Date of Service: [name] Olena Heckle, [place] 06/15/2019 9:00  AM Medical Record [id number] Patient Account Number: [id number] Date of Birth/Sex: Treating RN: 02/14/1981 (39 y.o. [name] Primary Care Quanda Pavlicek: Julianne Handler Other Clinician: Referring Myeshia Fojtik: Treating Jaquin Coy/Extender:Robson, Raymondo Band, [facility] Weeks in Treatment: 0 Clinic Level of Care Assessment Items TOOL 1 Quantity Score X - Use when EandM and Procedure is performed on INITIAL visit 1 0 ASSESSMENTS - Nursing Assessment / Reassessment X - General Physical Exam (combine w/ comprehensive assessment (listed just below) 1 20 when performed on new pt. evals) X - Comprehensive Assessment (HX, ROS, Risk Assessments, Wounds Hx, etc.) 1 25 ASSESSMENTS - Wound and Skin Assessment / Reassessment []  - Dermatologic / Skin Assessment (not related to wound area) 0 ASSESSMENTS - Ostomy and/or Continence Assessment and Care []  - Incontinence Assessment and Management 0 []  - Ostomy Care Assessment and Management (repouching, etc.) 0 PROCESS - Coordination of Care X - Simple Patient / Family Education for ongoing care 1 15 []  - Complex (extensive) Patient / Family Education for ongoing care 0 X - Staff obtains Programmer, systems, Records, Test Results / Process Orders 1 10 X - Staff telephones HHA, Nursing Homes / Clarify orders / etc 1 10 []  - Routine Transfer to another Facility (non-emergent condition) 0 []  - Routine Hospital Admission (non-emergent condition) 0 X - New Admissions / Biomedical engineer / Ordering NPWT, Apligraf, etc. 1 15 []  - Emergency Hospital Admission (emergent condition) 0 PROCESS - Special Needs []  - Pediatric / Minor Patient Management 0 []  - Isolation Patient Management 0 []  - Hearing / Language / Visual special needs 0 []  - Assessment of Community assistance (transportation, D/C planning, etc.) 0 []  - Additional assistance / Altered mentation 0 []  - Support Surface(s) Assessment (bed, cushion, seat, etc.) 0 INTERVENTIONS -  Miscellaneous []  - External ear exam 0 []  - Patient Transfer (multiple staff / Civil Service fast streamer / Similar devices) 0 []  - Simple Staple / Suture removal (25 or less) 0 []  - Complex Staple / Suture removal (26 or more) 0 []  - Hypo/Hyperglycemic Management (do not check if billed separately) 0 []  - Ankle / Brachial Index (ABI) - do not check if billed separately 0 Has the patient been seen at the hospital within  the last three years: Yes Total Score: 95 Level Of Care: New/Established - Level 3 Electronic Signature(s) Signed: 06/18/2019 6:12:41 PM By: Kela Millin Entered By: Kela Millin on 06/15/2019 10:51:22 -------------------------------------------------------------------------------- Encounter Discharge Information Details Patient Name: Date of Service: [name] DANIELS, [place] 06/15/2019 9:00 AM Medical Record [id number] Patient Account Number: [id number] Date of Birth/Sex: Treating RN: Oct 26, 1980 (39 y.o. Debby Bud Primary Care Levell Tavano: Julianne Handler Other Clinician: Referring Johnae Friley: Treating Lillyahna Hemberger/Extender:Robson, Raymondo Band, [facility] Weeks in Treatment: 0 Encounter Discharge Information Items Post Procedure Vitals Discharge Condition: Stable Temperature (F): 98.5 Ambulatory Status: Wheelchair Pulse (bpm): 118 Discharge Destination: [place] Respiratory Rate (breaths/min): 18 Telephoned: No Blood Pressure (mmHg): 143/57 Orders Sent: Yes Transportation: Private Auto Accompanied By: self Schedule Follow-up Appointment: No Clinical Summary of Care: Electronic Signature(s) Signed: 06/15/2019 5:41:48 PM By: Deon Pilling Entered By: Deon Pilling on 06/15/2019 11:14:27 -------------------------------------------------------------------------------- Lower Extremity Assessment Details Patient Name: Date of Service: [name] AYLIE, ROBBI[place] 06/15/2019 9:00 AM Medical Record [id number] Patient Account Number:  [id number] Date of Birth/Sex: Treating RN: 05-Jun-1980 (39 y.o. [name] Primary Care Latasha Puskas: Julianne Handler Other Clinician: Referring Cesare Sumlin: Treating Bryttani Blew/Extender:Robson, Raymondo Band, [facility] Weeks in Treatment: 0 Electronic Signature(s) Signed: 06/15/2019 5:30:09 PM By: [name] RN, BSN Entered By: [name] on 06/15/2019 09:36:20 -------------------------------------------------------------------------------- Multi Wound Chart Details Patient Name: Date of Service: [name] DANIELS, [place] 06/15/2019 9:00 AM Medical Record [id number] Patient Account Number: [id number] Date of Birth/Sex: Treating RN: 1980/10/28 (39 y.o. [name] Primary Care Shalissa Easterwood: Julianne Handler Other Clinician: Referring Sofija Antwi: Treating Shekinah Pitones/Extender:Robson, Raymondo Band, [facility] Weeks in Treatment: 0 Vital Signs Height(in): 65 Capillary Blood 150 Glucose(mg/dl): Weight(lbs): 130 Pulse(bpm): 118 Body Mass Index(BMI): 22 Blood Pressure(mmHg): 143/87 Temperature(F): 98.5 Respiratory 18 Rate(breaths/min): Photos: [1:No Photos] [2:No Photos] [N/A:N/A] Wound Location: [1:Sacrum] [2:Gluteal fold] [N/A:N/A] Wounding Event: [1:Pressure Injury] [2:Surgical Injury] [N/A:N/A] Primary Etiology: [1:Pressure Ulcer] [2:Necrotizing Infection] [N/A:N/A] Secondary Etiology: [1:Necrotizing Infection] [2:N/A] [N/A:N/A] Comorbid History: [1:Cataracts, Glaucoma, Anemia, Congestive Heart Anemia, Congestive Heart Failure, Coronary Artery Disease, Hypertension, Type II Diabetes, End Stage Type II Diabetes, End Stage Renal Disease, Neuropathy, Renal Disease, Neuropathy,  Confinement Anxiety] [2:Cataracts, Glaucoma, Failure, Coronary Artery Disease, Hypertension, Confinement Anxiety] [N/A:N/A] Date Acquired: [1:01/11/2019] [2:01/11/2019] [N/A:N/A] Weeks of Treatment: [1:0] [2:0] [N/A:N/A] Wound Status: [1:Open] [2:Open] [N/A:N/A] Measurements  L x W x D 8x6x0.8 [2:2.6x0.7x0.1] [N/A:N/A] (cm) Area (cm) : [1:37.699] [2:1.429] [N/A:N/A] Volume (cm) : [1:30.159] [2:0.143] [N/A:N/A] % Reduction in Area: [1:0.00%] [2:N/A] [N/A:N/A] % Reduction in Volume: 0.00% [2:N/A] [N/A:N/A] Classification: [1:Category/Stage IV] [2:Full Thickness Without Exposed Support Structures] [N/A:N/A] Exudate Amount: [1:Large] [2:Small] [N/A:N/A] Exudate Type: [1:Serosanguineous] [2:Serosanguineous] [N/A:N/A] Exudate Color: [1:red, brown] [2:red, brown] [N/A:N/A] Wound Margin: [1:Epibole] [2:Flat and Intact] [N/A:N/A] Granulation Amount: [1:Small (1-33%)] [2:Large (67-100%)] [N/A:N/A] Granulation Quality: [1:Red] [2:Red, Pink] [N/A:N/A] Necrotic Amount: [1:Large (67-100%)] [2:None Present (0%)] [N/A:N/A] Exposed Structures: [1:Fat Layer (Subcutaneous Fat Layer (Subcutaneous N/A Tissue) Exposed: Yes Fascia: No Tendon: No Muscle: No Joint: No Bone: No] [2:Tissue) Exposed: Yes Fascia: No Tendon: No Muscle: No Joint: No Bone: No] Epithelialization: [1:None] [2:Small (1-33%)] [N/A:N/A] Debridement: [1:Debridement - Excisional N/A] [N/A:N/A] Pre-procedure [1:10:45] [2:N/A] [N/A:N/A] Verification/Time Out Taken: Pain Control: [1:Other] [2:N/A] [N/A:N/A] Tissue Debrided: [1:Subcutaneous, Slough] [2:N/A] [N/A:N/A] Level: [1:Skin/Subcutaneous Tissue N/A] [N/A:N/A] Debridement Area (sq cm):48 [2:N/A] [N/A:N/A] Instrument: [1:Curette] [2:N/A] [N/A:N/A] Bleeding: [1:Moderate] [2:N/A] [N/A:N/A] Hemostasis Achieved: [1:Pressure] [2:N/A] [N/A:N/A] Procedural Pain: [1:0] [2:N/A] [N/A:N/A] Post Procedural Pain: [1:0] [2:N/A] [N/A:N/A] Debridement Treatment Procedure was tolerated [2:N/A] [N/A:N/A] Response: [1:well] Post Debridement [1:8x6x0.8] [2:N/A] [N/A:N/A] Measurements L x W x D (  cm) Post Debridement [1:30.159] [2:N/A] [N/A:N/A] Volume: (cm) Post Debridement Stage: Category/Stage IV [2:N/A] [N/A:N/A] Procedures Performed: Debridement [2:N/A]  [N/A:N/A] Treatment Notes Wound #1 (Sacrum) [1:1. Cleanse With Wound Cleanser 2. Periwound Care Skin Prep 3. Primary Dressing Applied Santyl Hydrofera Blue 4. Secondary Dressing ABD Pad 5. Secured With Medipore tape] Wound #2 (Gluteal fold) 1. Cleanse With Wound Cleanser 2. Periwound Care Skin Prep 3. Primary Dressing Applied Hydrofera Blue 4. Secondary Dressing ABD Pad 5. Secured With [employer]) Signed: 06/15/2019 5:45:04 PM By: Linton Ham MD Signed: 06/18/2019 6:12:41 PM By: Kela Millin Entered By: Linton Ham on 06/15/2019 11:28:24 -------------------------------------------------------------------------------- Multi-Disciplinary Care Plan Details Patient Name: Date of Service: [name] DANIELS, [place] 06/15/2019 9:00 AM Medical Record [id number] Patient Account Number: [id number] Date of Birth/Sex: Treating RN: [birth year] (39 y.o. [name] Primary Care Margarett Viti: Julianne Handler Other Clinician: Referring Issabelle Mcraney: Treating Calandria Mullings/Extender:Robson, Raymondo Band, [facility] Weeks in Treatment: 0 Active Inactive Pressure Nursing Diagnoses: Knowledge deficit related to management of pressures ulcers Goals: Patient/caregiver will verbalize understanding of pressure ulcer management Date Initiated: 06/15/2019 Target Resolution Date: 07/13/2019 Goal Status: Active Interventions: Provide education on pressure ulcers Notes: Wound/Skin Impairment Nursing Diagnoses: Impaired tissue integrity Goals: Ulcer/skin breakdown will have a volume reduction of 50% by week 8 Date Initiated: 06/15/2019 Target Resolution Date: 07/27/2019 Goal Status: Active Interventions: Provide education on ulcer and skin care Notes: Electronic Signature(s) Signed: 06/18/2019 6:12:41 PM By: Kela Millin Entered By: Kela Millin on 06/15/2019  10:35:14 -------------------------------------------------------------------------------- Pain Assessment Details Patient Name: Date of Service: [name] ZAHARAH, [place] 06/15/2019 9:00 AM Medical Record [id number] Patient Account Number: [id number] Date of Birth/Sex: Treating RN: Sep 27, 1980 (39 y.o. [name] Primary Care Marya Lowden: Julianne Handler Other Clinician: Referring Calloway Andrus: Treating Edwena Mayorga/Extender:Robson, Raymondo Band, [facility] Weeks in Treatment: 0 Active Problems Location of Pain Severity and Description of Pain Patient Has Paino Yes Site Locations Pain Location: Pain in Ulcers With Dressing Change: Yes Duration of the Pain. Constant / Intermittento Intermittent Rate the pain. Current Pain Level: 8 Worst Pain Level: 10 Least Pain Level: 2 Character of Pain Describe the Pain: Aching, Burning Pain Management and Medication Current Pain Management: Medication: Yes Other: reposition Is the Current Pain Management Adequate: Adequate How does your wound impact your activities of daily livingo Sleep: Yes Bathing: No Appetite: No Relationship With Others: No Bladder Continence: No Emotions: Yes Bowel Continence: No Hobbies: Yes Toileting: No Dressing: No Electronic Signature(s) Signed: 06/15/2019 5:30:09 PM By: [name] RN, BSN Entered By: [name] on 06/15/2019 09:52:20 -------------------------------------------------------------------------------- Patient/Caregiver Education Details Patient Name: Garlan Fair, [place] 2/12/2021andnbsp9:00 Date of Service: AM Medical Record [id number] Number: Patient Account Number: [id number] Date of Birth/Gender: 08-14-1980 (38 y.o. F) Treating RN: Kela Millin Other ClinicianJenna Luo, Primary Care Physician: Mariane Baumgarten Physician/ExtenderJenna Luo, Referring Physician: Ubaldo Glassing in Treatment: 0 Education Assessment Education  Provided To: Patient Education Topics Provided Pressure: Methods: Explain/Verbal Responses: State content correctly Wound/Skin Impairment: Methods: Explain/Verbal Responses: State content correctly Electronic Signature(s) Signed: 06/18/2019 6:12:41 PM By: Kela Millin Entered By: Kela Millin on 06/15/2019 10:35:29 -------------------------------------------------------------------------------- Wound Assessment Details Patient Name: Date of Service: [name] DALLYS, [place] 06/15/2019 9:00 AM Medical Record [id number] Patient Account Number: [id number] Date of Birth/Sex: Treating RN: [birth year] (39 y.o. [name] Primary Care Catheline Hixon: Julianne Handler Other Clinician: Referring Takahiro Godinho: Treating Berel Najjar/Extender:Robson, Raymondo Band, [facility] Weeks in Treatment: 0 Wound Status Wound Number: 1 Primary Pressure Ulcer Etiology: Wound Location: Sacrum Secondary Necrotizing Infection Wounding Event: Pressure Injury Etiology: Date Acquired: 01/11/2019 Wound Open  Weeks Of Treatment: 0 Status: Clustered Wound: No Comorbid Cataracts, Glaucoma, Anemia, Congestive History: Heart Failure, Coronary Artery Disease, Hypertension, Type II Diabetes, End Stage Renal Disease, Neuropathy, Confinement Anxiety Wound Measurements Length: (cm) 8 Width: (cm) 6 Depth: (cm) 0.8 Area: (cm) 37.699 Volume: (cm) 30.159 Wound Description Classification: Category/Stage IV Wound Margin: Epibole Exudate Amount: Large Exudate Type: Serosanguineous Exudate Color: red, brown Wound Bed Granulation Amount: Small (1-33%) Granulation Quality: Red Necrotic Amount: Large (67-100%) Necrotic Quality: Adherent Slough or After Cleansing: No Fibrino Yes Exposed Structure xposed: No r (Subcutaneous Tissue) Exposed: Yes sed: No sed: No ed: No d: No % Reduction in Area: 0% % Reduction in Volume: 0% Epithelialization: None Tunneling: No Undermining: No Foul  Od Slough/ Fascia E Fat Laye Tendon Expo Muscle Expo Joint Expos Bone Expose Treatment Notes Wound #1 (Sacrum) 1. Cleanse With Wound Cleanser 2. Periwound Care Skin Prep 3. Primary Dressing Applied Santyl Hydrofera Blue 4. Secondary Dressing ABD Pad 5. Secured With [employer]) Signed: 06/15/2019 4:39:06 PM By: Mikeal Hawthorne EMT/HBOT Signed: 06/18/2019 6:12:41 PM By: Kela Millin Entered By: Mikeal Hawthorne on 06/15/2019 12:14:22 -------------------------------------------------------------------------------- Wound Assessment Details Patient Name: Date of Service: [name] DANIELS, [place] 06/15/2019 9:00 AM Medical Record [id number] Patient Account Number: [id number] Date of Birth/Sex: Treating RN: 09-03-1980 (39 y.o. [name] Primary Care Cruze Zingaro: Julianne Handler Other Clinician: Referring Durwood Dittus: Treating Gerado Nabers/Extender:Robson, Raymondo Band, [facility] Weeks in Treatment: 0 Wound Status Wound Number: 2 Primary Pressure Ulcer Etiology: Wound Location: Gluteal fold Wound Open Wounding Event: Pressure Injury Status: Date Acquired: 01/11/2019 Comorbid Cataracts, Glaucoma, Anemia, Congestive Weeks Of Treatment: 0 History: Heart Failure, Coronary Artery Disease, Clustered Wound: No Hypertension, Type II Diabetes, End Stage Renal Disease, Neuropathy, Confinement Anxiety Wound Measurements Length: (cm) 2.6 % Reduction Width: (cm) 0.7 % Reduction Depth: (cm) 0.1 Epitheliali Area: (cm) 1.429 Tunneling: Volume: (cm) 0.143 Underminin Wound Description Classification: Category/Stage II Wound Margin: Flat and Intact Exudate Amount: Small Exudate Type: Serosanguineous Exudate Color: red, brown Wound Bed Granulation Amount: Large (67-100%) Granulation Quality: Red, Pink Necrotic Amount: None Present (0%) Foul Odor After Cleansing: No Slough/Fibrino No Exposed Structure Fascia Exposed: No Fat Layer  (Subcutaneous Tissue) Exposed: Yes Tendon Exposed: No Muscle Exposed: No Joint Exposed: No Bone Exposed: No in Area: 0% in Volume: 0% zation: Small (1-33%) No g: No Treatment Notes Wound #2 (Gluteal fold) 1. Cleanse With Wound Cleanser 2. Periwound Care Skin Prep 3. Primary Dressing Applied Hydrofera Blue 4. Secondary Dressing ABD Pad 5. Secured With [employer]) Signed: 06/15/2019 5:30:09 PM By: [name] RN, BSN Signed: 06/18/2019 6:12:41 PM By: Kela Millin Entered By: Kela Millin on 06/15/2019 11:28:50 -------------------------------------------------------------------------------- Vitals Details Patient Name: Date of Service: [name] DANIELS, [place] 06/15/2019 9:00 AM Medical Record [id number] Patient Account Number: [id number] Date of Birth/Sex: Treating RN: Sep 25, 1980 (39 y.o. [name] Primary Care Kelilah Hebard: Julianne Handler Other Clinician: Referring Crimson Beer: Treating Lyllian Gause/Extender:Robson, Raymondo Band, [facility] Weeks in Treatment: 0 Vital Signs Time Taken: 09:15 Temperature (F): 98.5 Height (in): 65 Pulse (bpm): 118 Source: Stated Respiratory Rate (breaths/min): 18 Weight (lbs): 130 Blood Pressure (mmHg): 143/87 Source: Stated Capillary Blood Glucose (mg/dl): 150 Body Mass Index (BMI): 21.6 Reference Range: 80 - 120 mg / dl Notes glucose per pt report this am Electronic Signature(s) Signed: 06/15/2019 5:30:09 PM By: [name] RN, BSN Entered By: [name] on 06/15/2019 09:16:03

## 2019-06-22 ENCOUNTER — Other Ambulatory Visit: Payer: Self-pay

## 2019-06-22 ENCOUNTER — Encounter (HOSPITAL_BASED_OUTPATIENT_CLINIC_OR_DEPARTMENT_OTHER): Payer: Medicare Other | Admitting: Internal Medicine

## 2019-06-22 DIAGNOSIS — E11622 Type 2 diabetes mellitus with other skin ulcer: Secondary | ICD-10-CM | POA: Diagnosis not present

## 2019-06-25 NOTE — Progress Notes (Signed)
[name] Barnett, ROMBACH ([id number]) Visit Report for 06/22/2019 Debridement Details Patient Name: Date of Service: [name] KAROL, SEGAWA 06/22/2019 9:15 AM Medical Record [id number] Patient Account Number: [id number] Date of Birth/Sex: Treating RN: 1981/04/27 (39 y.o. [name] Primary Care Provider: Julianne Handler Other Clinician: Referring Provider: Julianne Handler Treating Provider/Extender:Reiana Poteet, Esperanza Richters in Treatment: 1 Debridement Performed for Wound #1 Sacrum Assessment: Performed By: Physician Ricard Dillon., MD Debridement Type: Debridement Level of Consciousness (Pre- Awake and Alert procedure): Pre-procedure Yes - 10:20 Verification/Time Out Taken: Start Time: 10:20 Pain Control: Other : benzocaine, 20% Total Area Debrided (L x W): 7.5 (cm) x 5.5 (cm) = 41.25 (cm) Tissue and other material Viable, Non-Viable, Subcutaneous debrided: Level: Skin/Subcutaneous Tissue Debridement Description: Excisional Instrument: Curette Bleeding: Minimum Hemostasis Achieved: Pressure End Time: 10:21 Procedural Pain: 2 Post Procedural Pain: 0 Response to Treatment: Procedure was tolerated well Level of Consciousness Awake and Alert (Post-procedure): Post Debridement Measurements of Total Wound Length: (cm) 7.5 Stage: Category/Stage IV Width: (cm) 5.5 Depth: (cm) 1.1 Volume: (cm) 35.637 Character of Wound/Ulcer Post Improved Debridement: Post Procedure Diagnosis Same as Pre-procedure Electronic Signature(s) Signed: 06/22/2019 6:29:59 PM By: Linton Ham MD Signed: 06/25/2019 1:40:10 PM By: Kela Millin Entered By: Linton Ham on 06/22/2019 10:54:24 -------------------------------------------------------------------------------- HPI Details Patient Name: Date of Service: [name] Barnett, [place] 06/22/2019 9:15 AM Medical Record [id number] Patient Account Number: [id number] Date of Birth/Sex: Treating RN: 14-Nov-1980  (39 y.o. [name] Primary Care Provider: Julianne Handler Other Clinician: Referring Provider: Treating Provider/Extender:Shray Hunley, Raymondo Band, [facility] Weeks in Treatment: 1 History of Present Illness HPI Description: Admission 06/15/2019 This is a 39 year old woman who comes to [place] from [facility] skilled facility in [facility]. I am able to follow a lot of her history on care everywhere in epic. The patient is a type II diabetic and has been on dialysis. 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 I am not exactly sure of the nature of the stroke at this present time. She has made a decent recovery and is now up walking with a walker. In August 2020 she was admitted to [facility] in [place] with L4-L5 discitis. Culture and sensitivity showed Klebsiella. She was discharged on 8 weeks of ceftazidime. In mid September she is readmitted to the hospital with a pressure ulcer on her lower sacrum and necrotizing fasciitis. She required an extensive surgical debridement. CT scan of the pelvis at 1 point showed communication with the rectum. A colostomy was recommended and placed. Culture of this area showed Klebsiella and yeast. She was discharged on an extended course of meropenem and Diflucan. [facility] [place] We do not have any of these records. I think she was discharged to another nursing home but is come to [facility] skilled facility in [facility]. She was sent down here for review of the remaining wound. By review of the pictures that the patient had from September this is made considerable improvement. She tells me that the wound VAC was discontinued at the end of December 2020. Currently the facility is using a form of silver alginate to the wound surface. Past medical history type 2 diabetes with chronic renal failure  on dialysis Tuesday Thursday and Saturday. CVA with left hemiparesis in June 2020 but she appears to be making a good recovery. Chronic diastolic heart failure, hypertension 2/19; patient's wound area slightly smaller. The area that was towards her  anus has closed over. We are using Santyl covered with Hydrofera Blue. We have not been able to get her lab work apparently her veins are very difficult for phlebotomy. They are trying to get the blood work done where she dialyzes in [facility] but that requires some logistical issues. Electronic Signature(s) Signed: 06/22/2019 6:29:59 PM By: Linton Ham MD Entered By: Linton Ham on 06/22/2019 10:55:43 -------------------------------------------------------------------------------- Physical Exam Details Patient Name: Date of Service: [name] Barnett, SYMMES 06/22/2019 9:15 AM Medical Record [id number] Patient Account Number: [id number] Date of Birth/Sex: Treating RN: June 09, 1980 (39 y.o. [name] Primary Care Provider: Julianne Handler Other Clinician: Referring Provider: Treating Provider/Extender:Kaybree Williams, Raymondo Band, [facility] Weeks in Treatment: 1 Constitutional Patient is hypertensive.. Pulse regular and within target range for patient.Marland Kitchen Respirations regular, non-labored and within target range.. Temperature is normal and within the target range for the patient.Marland Kitchen Appears in no distress. Notes Wound exam The area in question is on the lower sacrum. Surface area of the wound is actually better. Still a very fibrinous nonviable surface. Using an open curette aggressive debridement of the fibrinous surface and subcutaneous tissue. She does not tolerate this well because of pain. The area that was between the folds here towards her anus has epithelialized over. No evidence of infection in either wound area Electronic Signature(s) Signed: 06/22/2019 6:29:59 PM By: Linton Ham MD Entered By:  Linton Ham on 06/22/2019 10:56:58 -------------------------------------------------------------------------------- Physician Orders Details Patient Name: Date of Service: [name] Barnett, DOWN 06/22/2019 9:15 AM Medical Record [id number] Patient Account Number: [id number] Date of Birth/Sex: Treating RN: May 30, 1980 (39 y.o. [name] Primary Care Provider: Julianne Handler Other Clinician: Referring Provider: Treating Provider/Extender:Aarian Cleaver, Raymondo Band, [facility] Weeks in Treatment: 1 Verbal / Phone Orders: No Diagnosis Coding ICD-10 Coding Code Description L89.154 Pressure ulcer of sacral region, stage 4 L89.152 Pressure ulcer of sacral region, stage 2 Disruption of external operation (surgical) wound, not elsewhere classified, subsequent T81.31XD encounter M72.6 Necrotizing fasciitis Follow-up Appointments Return Appointment in 1 week. Dressing Change Frequency Wound #1 Sacrum Change dressing every day. Skin Barriers/Peri-Wound Care Skin Prep Wound Cleansing Wound #1 Sacrum Clean wound with Wound Cleanser - or normal saline Primary Wound Dressing Wound #1 Sacrum Hydrofera Blue Santyl Ointment - santyl to base and hydrofera blue over santyl Secondary Dressing Wound #1 Sacrum Foam Off-Loading Turn and reposition every 2 hours Electronic Signature(s) Signed: 06/22/2019 6:29:59 PM By: Linton Ham MD Signed: 06/25/2019 1:40:10 PM By: Kela Millin Entered By: Kela Millin on 06/22/2019 10:13:35 -------------------------------------------------------------------------------- Problem List Details Patient Name: Date of Service: [name] Barnett, Adanya 06/22/2019 9:15 AM Medical Record [id number] Patient Account Number: [id number] Date of Birth/Sex: Treating RN: 06/01/1980 (39 y.o. [name] Primary Care Provider: Julianne Handler Other Clinician: Referring Provider: Treating Provider/Extender:Shawni Volkov,  Raymondo Band, [facility] Weeks in Treatment: 1 Active Problems ICD-10 Evaluated Encounter Code Description Active Date Today Diagnosis L89.154 Pressure ulcer of sacral region, stage 4 06/15/2019 No Yes L89.152 Pressure ulcer of sacral region, stage 2 06/15/2019 No Yes T81.31XD Disruption of external operation (surgical) wound, not 06/15/2019 No Yes elsewhere classified, subsequent encounter M72.6 Necrotizing fasciitis 06/15/2019 No Yes Inactive Problems Resolved Problems Electronic Signature(s) Signed: 06/22/2019 6:29:59 PM By: Linton Ham MD Entered By: Linton Ham on 06/22/2019 10:54:08 -------------------------------------------------------------------------------- Progress Note Details Patient Name: Date of Service: [name] Barnett, [place] 06/22/2019 9:15 AM Medical Record [id number] Patient Account Number: [id number] Date of Birth/Sex: Treating RN: Sep 27, 1980 (39 y.o. [name] Primary Care Provider: Julianne Handler Other Clinician: Referring Provider: Treating Provider/Extender:Braedin Millhouse, Raymondo Band,  [facility] Weeks in Treatment: 1 Subjective History of Present Illness (HPI) Admission 06/15/2019 This is a 39 year old woman who comes to [place] from [facility] skilled facility in [place]. I am able to follow a lot of her history on care everywhere in epic. The patient is a type II diabetic and has been on dialysis. 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 I am not exactly sure of the nature of the stroke at this present time. She has made a decent recovery and is now up walking with a walker. In August 2020 she was admitted to [facility] in [place] with L4-L5 discitis. Culture and sensitivity showed Klebsiella. She was discharged on 8 weeks of ceftazidime. In mid September she is readmitted to the hospital with a pressure ulcer on her lower sacrum and  necrotizing fasciitis. She required an extensive surgical debridement. CT scan of the pelvis at 1 point showed communication with the rectum. A colostomy was recommended and placed. Culture of this area showed Klebsiella and yeast. She was discharged on an extended course of meropenem and Diflucan. [facility] [place] We do not have any of these records. I think she was discharged to another nursing home but is come to [facility] skilled facility in [facility]. She was sent down here for review of the remaining wound. By review of the pictures that the patient had from September this is made considerable improvement. She tells me that the wound VAC was discontinued at the end of December 2020. Currently the facility is using a form of silver alginate to the wound surface. Past medical history type 2 diabetes with chronic renal failure on dialysis Tuesday Thursday and Saturday. CVA with left hemiparesis in June 2020 but she appears to be making a good recovery. Chronic diastolic heart failure, hypertension 2/19; patient's wound area slightly smaller. The area that was towards her anus has closed over. We are using Santyl covered with Hydrofera Blue. We have not been able to get her lab work apparently her veins are very difficult for phlebotomy. They are trying to get the blood work done where she dialyzes in [facility] but that requires some logistical issues. Objective Constitutional Patient is hypertensive.. Pulse regular and within target range for patient.Marland Kitchen Respirations regular, non-labored and within target range.. Temperature is normal and within the target range for the patient.Marland Kitchen Appears in no distress. Vitals Time Taken: 9:49 AM, Height: 65 in, Weight: 130 lbs, BMI: 21.6, Temperature: 98.4 F, Pulse: 108 bpm, Respiratory Rate: 18 breaths/min, Blood Pressure: 176/97 mmHg, Capillary Blood Glucose: 210 mg/dl. General  Notes: Wound exam ooThe area in question is on the lower sacrum. Surface area of the wound is actually better. Still a very fibrinous nonviable surface. Using an open curette aggressive debridement of the fibrinous surface and subcutaneous tissue. She does not tolerate this well because of pain. ooThe area that was between the folds here towards her anus has epithelialized over. ooNo evidence of infection in either wound area Integumentary (Hair, Skin) Wound #1 status is Open. Original cause of wound was Pressure Injury. The wound is located on the Sacrum. The wound measures 7.5cm length x 5.5cm width x 1.1cm depth; 32.398cm^2 area and 35.637cm^3 volume. There is Fat Layer (Subcutaneous Tissue) Exposed exposed. There is no tunneling or undermining noted. There is a medium amount of serosanguineous drainage noted. The wound margin is epibole. There is  medium (34-66%) pink granulation within the wound bed. There is a medium (34-66%) amount of necrotic tissue within the wound bed including Adherent Slough. Wound #2 status is Healed - Epithelialized. Original cause of wound was Pressure Injury. The wound is located on the Gluteal fold. The wound measures 0cm length x 0cm width x 0cm depth; 0cm^2 area and 0cm^3 volume. There is no tunneling or undermining noted. There is a none present amount of drainage noted. The wound margin is flat and intact. There is no granulation within the wound bed. There is no necrotic tissue within the wound bed. Assessment Active Problems ICD-10 Pressure ulcer of sacral region, stage 4 Pressure ulcer of sacral region, stage 2 Disruption of external operation (surgical) wound, not elsewhere classified, subsequent encounter Necrotizing fasciitis Procedures Wound #1 Pre-procedure diagnosis of Wound #1 is a Pressure Ulcer located on the Sacrum . There was a Excisional Skin/Subcutaneous Tissue Debridement with a total area of 41.25 sq cm performed by Ricard Dillon., MD. With the following instrument(s): Curette to remove Viable and Non-Viable tissue/material. Material removed includes Subcutaneous Tissue after achieving pain control using Other (benzocaine, 20%). No specimens were taken. A time out was conducted at 10:20, prior to the start of the procedure. A Minimum amount of bleeding was controlled with Pressure. The procedure was tolerated well with a pain level of 2 throughout and a pain level of 0 following the procedure. Post Debridement Measurements: 7.5cm length x 5.5cm width x 1.1cm depth; 35.637cm^3 volume. Post debridement Stage noted as Category/Stage IV. Character of Wound/Ulcer Post Debridement is improved. Post procedure Diagnosis Wound #1: Same as Pre-Procedure Plan Follow-up Appointments: Return Appointment in 1 week. Dressing Change Frequency: Wound #1 Sacrum: Change dressing every day. Skin Barriers/Peri-Wound Care: Skin Prep Wound Cleansing: Wound #1 Sacrum: Clean wound with Wound Cleanser - or normal saline Primary Wound Dressing: Wound #1 Sacrum: Hydrofera Blue Santyl Ointment - santyl to base and hydrofera blue over santyl Secondary Dressing: Wound #1 Sacrum: Foam Off-Loading: Turn and reposition every 2 hours 1. Aggressive debridement again 2. I have not change the Santyl/Hydrofera Blue dressing but I may consider changing to Iodoflex depending on the dimensions in the condition of the wound surface next time. Electronic Signature(s) Signed: 06/22/2019 6:29:59 PM By: Linton Ham MD Entered By: Linton Ham on 06/22/2019 10:57:32 -------------------------------------------------------------------------------- SuperBill Details Patient Name: Date of Service: [name] Barnett, [place] 06/22/2019 Medical Record [id number] Patient Account Number: [id number] Date of Birth/Sex: Treating RN: Jul 06, 1980 (39 y.o. [name] Primary Care Provider: Julianne Handler Other Clinician: Referring  Provider: Treating Provider/Extender:Hideko Esselman, Raymondo Band, [facility] Weeks in Treatment: 1 Diagnosis Coding ICD-10 Codes Code Description L89.154 Pressure ulcer of sacral region, stage 4 L89.152 Pressure ulcer of sacral region, stage 2 Disruption of external operation (surgical) wound, not elsewhere classified, subsequent T81.31XD encounter M72.6 Necrotizing fasciitis Facility Procedures CPT4 Code: [id number] Description: 11042 - DEB SUBQ TISSUE 20 SQ CM/< ICD-10 Diagnosis Description L89.154 Pressure ulcer of sacral region, stage 4 Modifier: Quantity: 1 CPT4 Code: [id number] Description: 11045 - DEB SUBQ TISS EA ADDL 20CM ICD-10 Diagnosis Description L89.154 Pressure ulcer of sacral region, stage 4 Modifier: Quantity: 2 Physician Procedures CPT4 Code: [id number] Description: 11042 - WC PHYS SUBQ TISS 20 SQ CM ICD-10 Diagnosis Description L89.154 Pressure ulcer of sacral region, stage 4 Modifier: Quantity: 1 CPT4 Code: [id number] Description: 11045 - WC PHYS SUBQ TISS EA ADDL 20 CM ICD-10 Diagnosis Description L89.154 Pressure ulcer of sacral region, stage 4 Modifier: Quantity: 2 Electronic Signature(s) Signed: 06/22/2019 6:29:59  PM By: Linton Ham MD Entered By: Linton Ham on 06/22/2019 10:57:44

## 2019-06-29 ENCOUNTER — Encounter (HOSPITAL_BASED_OUTPATIENT_CLINIC_OR_DEPARTMENT_OTHER): Payer: Medicare Other | Admitting: Internal Medicine

## 2019-06-29 ENCOUNTER — Other Ambulatory Visit: Payer: Self-pay

## 2019-06-29 DIAGNOSIS — E11622 Type 2 diabetes mellitus with other skin ulcer: Secondary | ICD-10-CM | POA: Diagnosis not present

## 2019-06-29 NOTE — Progress Notes (Signed)
[name] Barnett, SVEUM ([id number]) Visit Report for 06/29/2019 Debridement Details Patient Name: Date of Service: [name] Barnett, APKARIAN 06/29/2019 9:30 AM Medical Record [id number] Patient Account Number: [id number] Date of Birth/Sex: Treating RN: February 02, 1981 (39 y.o. [name] Barnett Primary Care Provider: Julianne Barnett Other Clinician: Referring Provider: Julianne Barnett Treating Provider/Extender:[name] Barnett, [name] Barnett in Treatment: 2 Debridement Performed for Wound #1 Sacrum Assessment: Performed By: Physician [name] Barnett., MD Debridement Type: Debridement Level of Consciousness (Pre- Awake and Alert procedure): Pre-procedure Yes - 10:43 Verification/Time Out Taken: Start Time: 10:43 Pain Control: Other : benzocaine, 20% Total Area Debrided (L x W): 7.8 (cm) x 5.6 (cm) = 43.68 (cm) Tissue and other material Viable, Non-Viable, Subcutaneous debrided: Level: Skin/Subcutaneous Tissue Debridement Description: Excisional Instrument: Curette Bleeding: Minimum Hemostasis Achieved: Pressure End Time: 10:44 Procedural Pain: 3 Post Procedural Pain: 0 Response to Treatment: Procedure was tolerated well Level of Consciousness Awake and Alert (Post-procedure): Post Debridement Measurements of Total Wound Length: (cm) 7.8 Stage: Category/Stage IV Width: (cm) 5.6 Depth: (cm) 0.4 Volume: (cm) 13.722 Character of Wound/Ulcer Post Improved Debridement: Post Procedure Diagnosis Same as Pre-procedure Electronic Signature(s) Signed: 06/29/2019 5:17:01 PM By: [name] Barnett Signed: 06/29/2019 5:26:28 PM By: [name] Ham MD Entered By: [name] Barnett on 06/29/2019 11:04:15 -------------------------------------------------------------------------------- HPI Details Patient Name: Date of Service: [name] Barnett, Berthold 06/29/2019 9:30 AM Medical Record [id number] Patient Account Number: [id number] Date of Birth/Sex: Treating RN: [birth year]  (39 y.o. [name] Barnett Primary Care Provider: Julianne Barnett Other Clinician: Referring Provider: Treating Provider/Extender:[name] Barnett, [name] Barnett, [facility] Weeks in Treatment: 2 History of Present Illness HPI Description: Admission 06/15/2019 This is a 39 year old woman who comes to [place] from [facility] skilled facility in [place]. I am able to follow a lot of her history on care everywhere in epic. The patient is a type II diabetic and has been on dialysis. 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 I am not exactly sure of the nature of the stroke at this present time. She has made a decent recovery and is now up walking with a walker. In August 2020 she was admitted to [facility] in [place] with L4-L5 discitis. Culture and sensitivity showed Klebsiella. She was discharged on 8 weeks of ceftazidime. In mid September she is readmitted to the hospital with a pressure ulcer on her lower sacrum and necrotizing fasciitis. She required an extensive surgical debridement. CT scan of the pelvis at 1 point showed communication with the rectum. A colostomy was recommended and placed. Culture of this area showed Klebsiella and yeast. She was discharged on an extended course of meropenem and Diflucan. [facility] [place] We do not have any of these records. I think she was discharged to another nursing home but is come to [facility] skilled facility in [facility]. She was sent down here for review of the remaining wound. By review of the pictures that the patient had from September this is made considerable improvement. She tells me that the wound VAC was discontinued at the end of December 2020. Currently the facility is using a form of silver alginate to the wound surface. Past medical history type 2 diabetes with chronic renal failure  on dialysis Tuesday Thursday and Saturday. CVA with left hemiparesis in June 2020 but she appears to be making a good recovery. Chronic diastolic heart failure, hypertension 2/19; patient's wound area slightly smaller. The area that was towards her  anus has closed over. We are using Santyl covered with Hydrofera Blue. We have not been able to get her lab work apparently her veins are very difficult for phlebotomy. They are trying to get the blood work done where she dialyzes in [facility] but that requires some logistical issues. 2/26; not much change in the wound. Still a nonviable surface we have been using Santyl and Hydrofera Blue. I changed her to [facility] only. She tells me she has rigorously offloading this area she also complains of some pain Electronic Signature(s) Signed: 06/29/2019 5:26:28 PM By: [name] Ham MD Entered By: [name] Barnett on 06/29/2019 11:04:49 -------------------------------------------------------------------------------- Physical Exam Details Patient Name: Date of Service: [name] Barnett, [name] Barnett 06/29/2019 9:30 AM Medical Record [id number] Patient Account Number: [id number] Date of Birth/Sex: Treating RN: February 13, 1981 (39 y.o. [name] Barnett Primary Care Provider: Julianne Barnett Other Clinician: Referring Provider: Treating Provider/Extender:[name] Barnett, [name] Barnett, [facility] Weeks in Treatment: 2 Constitutional Sitting or standing Blood Pressure is within target range for patient.. Pulse regular and within target range for patient.Marland Kitchen Respirations regular, non-labored and within target range.. Temperature is normal and within the target range for the patient.Marland Kitchen Appears in no distress. Notes Wound exam The area in question is on the lower sacrum. Surface looks somewhat better but with use of an open curette still very fibrinous material. Very difficult to debride but I went at this again today aggressively trying to get  to a more sustainable surface for healing. She has rims of epithelialization although they are not able to get over the surface of this wound Electronic Signature(s) Signed: 06/29/2019 5:26:28 PM By: [name] Ham MD Entered By: [name] Barnett on 06/29/2019 11:05:49 -------------------------------------------------------------------------------- Physician Orders Details Patient Name: Date of Service: [name] Barnett, [name] Barnett 06/29/2019 9:30 AM Medical Record [id number] Patient Account Number: [id number] Date of Birth/Sex: Treating RN: 07/09/1980 (39 y.o. [name] Barnett Primary Care Provider: Julianne Barnett Other Clinician: Referring Provider: Treating Provider/Extender:Myndi Wamble, [name] Barnett, [facility] Weeks in Treatment: 2 Verbal / Phone Orders: No Diagnosis Coding ICD-10 Coding Code Description L89.154 Pressure ulcer of sacral region, stage 4 L89.152 Pressure ulcer of sacral region, stage 2 Disruption of external operation (surgical) wound, not elsewhere classified, subsequent T81.31XD encounter M72.6 Necrotizing fasciitis Follow-up Appointments Return Appointment in 1 week. Dressing Change Frequency Wound #1 Sacrum Change dressing every day. Skin Barriers/Peri-Wound Care Skin Prep Wound Cleansing Wound #1 Sacrum Clean wound with Wound Cleanser - or normal saline Primary Wound Dressing Wound #1 Sacrum Hydrofera Blue Secondary Dressing Wound #1 Sacrum Foam Off-Loading Turn and reposition every 2 hours Electronic Signature(s) Signed: 06/29/2019 5:17:01 PM By: [name] Barnett Signed: 06/29/2019 5:26:28 PM By: [name] Ham MD Entered By: [name] Barnett on 06/29/2019 10:45:46 -------------------------------------------------------------------------------- Problem List Details Patient Name: Date of Service: [name] Barnett, [name] Barnett 06/29/2019 9:30 AM Medical Record [id number] Patient Account Number: [id number] Date of  Birth/Sex: Treating RN: [birth year] (39 y.o. [name] Barnett Primary Care Provider: Julianne Barnett Other Clinician: Referring Provider: Treating Provider/Extender:Jalan Bodi, [name] Barnett, [facility] Weeks in Treatment: 2 Active Problems ICD-10 Evaluated Encounter Code Description Active Date Today Diagnosis L89.154 Pressure ulcer of sacral region, stage 4 06/15/2019 No Yes L89.152 Pressure ulcer of sacral region, stage 2 06/15/2019 No Yes T81.31XD Disruption of external operation (surgical) wound, not 06/15/2019 No Yes elsewhere classified, subsequent encounter M72.6 Necrotizing fasciitis 06/15/2019 No Yes Inactive Problems Resolved Problems Electronic Signature(s) Signed: 06/29/2019 5:26:28 PM By: [name] Ham MD Entered By: [name] Barnett on 06/29/2019 11:03:49 -------------------------------------------------------------------------------- Progress Note Details Patient Name: Date of Service: [name] Barnett,  [name] Barnett 06/29/2019 9:30 AM Medical Record [id number] Patient Account Number: [id number] Date of Birth/Sex: Treating RN: Oct 23, 1980 (39 y.o. [name] Barnett Primary Care Provider: Julianne Barnett Other Clinician: Referring Provider: Treating Provider/Extender:Aryah Doering, [name] Barnett, [facility] Weeks in Treatment: 2 Subjective History of Present Illness (HPI) Admission 06/15/2019 This is a 39 year old woman who comes to [place] from [facility] skilled facility in [facility]. I am able to follow a lot of her history on care everywhere in epic. The patient is a type II diabetic and has been on dialysis. 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 I am not exactly sure of the nature of the stroke at this present time. She has made a decent recovery and is now up walking with a walker. In August 2020 she was admitted to [facility] in [place] with L4-L5 discitis.  Culture and sensitivity showed Klebsiella. She was discharged on 8 weeks of ceftazidime. In mid September she is readmitted to the hospital with a pressure ulcer on her lower sacrum and necrotizing fasciitis. She required an extensive surgical debridement. CT scan of the pelvis at 1 point showed communication with the rectum. A colostomy was recommended and placed. Culture of this area showed Klebsiella and yeast. She was discharged on an extended course of meropenem and Diflucan. [facility] [place] We do not have any of these records. I think she was discharged to another nursing home but is come to [facility] skilled facility in [facility]. She was sent down here for review of the remaining wound. By review of the pictures that the patient had from September this is made considerable improvement. She tells me that the wound VAC was discontinued at the end of December 2020. Currently the facility is using a form of silver alginate to the wound surface. Past medical history type 2 diabetes with chronic renal failure on dialysis Tuesday Thursday and Saturday. CVA with left hemiparesis in June 2020 but she appears to be making a good recovery. Chronic diastolic heart failure, hypertension 2/19; patient's wound area slightly smaller. The area that was towards her anus has closed over. We are using Santyl covered with Hydrofera Blue. We have not been able to get her lab work apparently her veins are very difficult for phlebotomy. They are trying to get the blood work done where she dialyzes in [facility] but that requires some logistical issues. 2/26; not much change in the wound. Still a nonviable surface we have been using Santyl and Hydrofera Blue. I changed her to [facility] only. She tells me she has rigorously offloading this area she also complains of some pain Objective Constitutional Sitting or standing Blood Pressure  is within target range for patient.. Pulse regular and within target range for patient.Marland Kitchen Respirations regular, non-labored and within target range.. Temperature is normal and within the target range for the patient.Marland Kitchen Appears in no distress. Vitals Time Taken: 9:55 AM, Height: 65 in, Source: Stated, Weight: 130 lbs, Source: Stated, BMI: 21.6, Temperature: 98.3 F, Pulse: 105 bpm, Respiratory Rate: 18 breaths/min, Blood Pressure: 132/79 mmHg, Capillary Blood Glucose: 162 mg/dl. General Notes: glucose per pt report this am General Notes: Wound exam ooThe area in question is on the lower sacrum. Surface looks somewhat better but with use of an open curette still very fibrinous material. Very difficult to debride but I went at this again today aggressively trying to get to  a more sustainable surface for healing. She has rims of epithelialization although they are not able to get over the surface of this wound Integumentary (Hair, Skin) Wound #1 status is Open. Original cause of wound was Pressure Injury. The wound is located on the Sacrum. The wound measures 7.8cm length x 5.6cm width x 0.4cm depth; 34.306cm^2 area and 13.722cm^3 volume. There is Fat Layer (Subcutaneous Tissue) Exposed exposed. There is no tunneling or undermining noted. There is a large amount of purulent drainage noted. The wound margin is epibole. There is small (1-33%) pink granulation within the wound bed. There is a large (67-100%) amount of necrotic tissue within the wound bed including Adherent Slough. Assessment Active Problems ICD-10 Pressure ulcer of sacral region, stage 4 Pressure ulcer of sacral region, stage 2 Disruption of external operation (surgical) wound, not elsewhere classified, subsequent encounter Necrotizing fasciitis Procedures Wound #1 Pre-procedure diagnosis of Wound #1 is a Pressure Ulcer located on the Sacrum . There was a Excisional Skin/Subcutaneous Tissue Debridement with a total area of 43.68  sq cm performed by [name] Barnett., MD. With the following instrument(s): Curette to remove Viable and Non-Viable tissue/material. Material removed includes Subcutaneous Tissue after achieving pain control using Other (benzocaine, 20%). No specimens were taken. A time out was conducted at 10:43, prior to the start of the procedure. A Minimum amount of bleeding was controlled with Pressure. The procedure was tolerated well with a pain level of 3 throughout and a pain level of 0 following the procedure. Post Debridement Measurements: 7.8cm length x 5.6cm width x 0.4cm depth; 13.722cm^3 volume. Post debridement Stage noted as Category/Stage IV. Character of Wound/Ulcer Post Debridement is improved. Post procedure Diagnosis Wound #1: Same as Pre-Procedure Plan Follow-up Appointments: Return Appointment in 1 week. Dressing Change Frequency: Wound #1 Sacrum: Change dressing every day. Skin Barriers/Peri-Wound Care: Skin Prep Wound Cleansing: Wound #1 Sacrum: Clean wound with Wound Cleanser - or normal saline Primary Wound Dressing: Wound #1 Sacrum: Hydrofera Blue Secondary Dressing: Wound #1 Sacrum: Foam Off-Loading: Turn and reposition every 2 hours 1. I changed the dressing to pure Hydrofera Blue. 2. I do not seem to be able to get to a more viable wound surface. Although the wound does not have the appearance even under illumination that is that bad, using an open curette there is a very fibrinous adherent debris that is difficult to debride. Electronic Signature(s) Signed: 06/29/2019 5:26:28 PM By: [name] Ham MD Entered By: [name] Barnett on 06/29/2019 11:07:14 -------------------------------------------------------------------------------- SuperBill Details Patient Name: Date of Service: [name] Barnett, [name] Barnett 06/29/2019 Medical Record [id number] Patient Account Number: [id number] Date of Birth/Sex: Treating RN: 1981-02-22 (39 y.o. [name] Barnett Primary  Care Provider: Julianne Barnett Other Clinician: Referring Provider: Treating Provider/Extender:Tniyah Nakagawa, [name] Barnett, [facility] Weeks in Treatment: 2 Diagnosis Coding ICD-10 Codes Code Description L89.154 Pressure ulcer of sacral region, stage 4 L89.152 Pressure ulcer of sacral region, stage 2 Disruption of external operation (surgical) wound, not elsewhere classified, subsequent T81.31XD encounter M72.6 Necrotizing fasciitis Facility Procedures CPT4 Code: [id number] Description: [id number] - DEB SUBQ TISSUE 20 SQ CM/< ICD-10 Diagnosis Description L89.154 Pressure ulcer of sacral region, stage 4 Modifier: Quantity: 1 CPT4 Code: [id number] Description: [id number] - DEB SUBQ TISS EA ADDL 20CM ICD-10 Diagnosis Description L89.154 Pressure ulcer of sacral region, stage 4 Modifier: Quantity: 2 Physician Procedures CPT4 Code: [id number] Description: [id number] - WC PHYS SUBQ TISS 20 SQ CM ICD-10 Diagnosis Description L89.154 Pressure ulcer of sacral region, stage 4 Modifier: Quantity: 1 CPT4 Code: [id number] Description: [id number] -  WC PHYS SUBQ TISS EA ADDL 20 CM ICD-10 Diagnosis Description L89.154 Pressure ulcer of sacral region, stage 4 Modifier: Quantity: 2 Electronic Signature(s) Signed: 06/29/2019 5:26:28 PM By: [name] Ham MD Entered By: [name] Barnett on 06/29/2019 11:07:34

## 2019-06-29 NOTE — Progress Notes (Signed)
[name] Barnett, [name] Barnett ([id number]) Visit Report for 06/29/2019 Arrival Information Details Patient Name: Date of Service: [name] DEEDRE, GARTHE 06/29/2019 9:30 AM Medical Record [id number] Patient Account Number: [id number] Date of Birth/Sex: Treating RN: 11-24-1980 (39 y.o. [name] Primary Care Cristal Qadir: Julianne Handler Other Clinician: Referring Trayvon Trumbull: Treating Stalin Gruenberg/Extender:Robson, Raymondo Band, [facility] Weeks in Treatment: 2 Visit Information History Since Last Visit Added or deleted any medications: No Patient Arrived: Wheel Chair Any new allergies or adverse reactions: No Arrival Time: 09:47 Had a fall or experienced change in No activities of daily living that may affect Accompanied By: facility risk of falls: staff Signs or symptoms of abuse/neglect since last No Transfer Assistance: None visito Patient Identification Verified: Yes Hospitalized since last visit: No Secondary Verification Process Completed: Yes Implantable device outside of the clinic excluding No Patient Requires Transmission-Based No cellular tissue based products placed in the center Precautions: since last visit: Patient Has Alerts: No Has Dressing in Place as Prescribed: Yes Pain Present Now: Yes Electronic Signature(s) Signed: 06/29/2019 5:01:55 PM By: [name] RN, BSN Entered By: [name] on 06/29/2019 09:55:20 -------------------------------------------------------------------------------- Encounter Discharge Information Details Patient Name: Date of Service: [name] Barnett, [place] 06/29/2019 9:30 AM Medical Record [id number] Patient Account Number: [id number] Date of Birth/Sex: Treating RN: [birth year] (39 y.o. Debby Bud Primary Care Aitanna Haubner: Julianne Handler Other Clinician: Referring Latricia Cerrito: Treating Rajanae Mantia/Extender:Robson, Raymondo Band, [facility] Weeks in Treatment: 2 Encounter Discharge Information Items  Post Procedure Vitals Discharge Condition: Stable Temperature (F): 98.3 Ambulatory Status: Wheelchair Pulse (bpm): 105 Discharge Destination: Home Respiratory Rate (breaths/min): 18 Transportation: Private Auto Blood Pressure (mmHg): 132/79 Accompanied By: caregiver Schedule Follow-up Appointment: Yes Clinical Summary of Care: Electronic Signature(s) Signed: 06/29/2019 4:47:37 PM By: Deon Pilling Entered By: Deon Pilling on 06/29/2019 11:02:26 -------------------------------------------------------------------------------- Lower Extremity Assessment Details Patient Name: Date of Service: [name] Barnett, [name] Barnett 06/29/2019 9:30 AM Medical Record [id number] Patient Account Number: [id number] Date of Birth/Sex: Treating RN: 1980-06-12 (39 y.o. [name] Primary Care Graciana Sessa: Julianne Handler Other Clinician: Referring Barnabas Henriques: Treating Shervon Kerwin/Extender:Robson, Raymondo Band, [facility] Weeks in Treatment: 2 Electronic Signature(s) Signed: 06/29/2019 5:01:55 PM By: [name] RN, BSN Entered By: [name] on 06/29/2019 09:57:53 -------------------------------------------------------------------------------- Multi Wound Chart Details Patient Name: Date of Service: [name] Barnett, [name] Barnett 06/29/2019 9:30 AM Medical Record [id number] Patient Account Number: [id number] Date of Birth/Sex: Treating RN: 1980-09-11 (39 y.o. 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] [N/A:N/A N/A] Comorbid History: [1:Cataracts, Glaucoma, Anemia, Congestive Heart Failure, Coronary Artery Disease, Hypertension, Type II Diabetes, End Stage Renal Disease, Neuropathy, Confinement Anxiety] [N/A:N/A N/A] Date Acquired: [1:01/11/2019] [N/A:N/A N/A] Weeks of Treatment: [1:2] [N/A:N/A N/A] Wound Status: [1:Open] [N/A:N/A N/A] Measurements L x W x D 7.8x5.6x0.4 [N/A:N/A N/A] (cm) Area (cm) : [1:34.306] [N/A:N/A N/A] Volume (cm) : [1:13.722] [N/A:N/A N/A] % Reduction in Area: [1:9.00%] [N/A:N/A N/A] % Reduction in Volume: 54.50% [N/A:N/A N/A] Classification: [1:Category/Stage IV] [N/A:N/A N/A] Exudate Amount: [1:Large] [N/A:N/A N/A] Exudate Type: [1:Purulent] [N/A:N/A N/A] Exudate Color: [1:yellow, brown, green] [N/A:N/A N/A] Wound Margin: [1:Epibole] [N/A:N/A N/A] Granulation Amount: [1:Small (1-33%)] [N/A:N/A N/A] Granulation Quality: [1:Pink] [N/A:N/A N/A] Necrotic Amount: [1:Large (67-100%)] [N/A:N/A N/A] Exposed Structures: [1:Fat Layer (Subcutaneous N/A Tissue)  Exposed: Yes Fascia: No Tendon: No Muscle: No Joint: No Bone: No] [N/A:N/A] Epithelialization: [1:Small (1-33%)] [N/A:N/A N/A] Debridement: [1:Debridement - Excisional N/A] [N/A:N/A] Pre-procedure [1:10:43] [N/A:N/A N/A] Verification/Time Out Taken: Pain Control: [1:Other] [N/A:N/A N/A] Tissue Debrided: [1:Subcutaneous] [N/A:N/A N/A] Level: [1:Skin/Subcutaneous Tissue N/A] [N/A:N/A] Debridement Area (sq cm):43.68 [N/A:N/A N/A] Instrument: [1:Curette] [N/A:N/A N/A] Bleeding: [1:Minimum] [N/A:N/A N/A] Hemostasis Achieved: [1:Pressure] [N/A:N/A N/A] Procedural Pain: [1:3] [N/A:N/A N/A] Post Procedural Pain: [1:0] [N/A:N/A N/A] Debridement Treatment Procedure was tolerated [N/A:N/A N/A] Response: [1:well] Post Debridement [1:7.8x5.6x0.4] [N/A:N/A N/A] Measurements L x W x D (cm) Post Debridement [1:13.722] [N/A:N/A  N/A] Volume: (cm) Post Debridement Stage: Category/Stage IV [N/A:N/A N/A N/A N/A] Treatment Notes Wound #1 (Sacrum) 1. Cleanse With Wound Cleanser 2. Periwound Care Skin Prep 3. Primary Dressing Applied Hydrofera Blue 4. Secondary Dressing Foam Border Dressing 5. Secured With Self Adhesive Bandage Notes Hydrofera Blue Electronic Signature(s) Signed: 06/29/2019 5:17:01 PM By: Kela Millin Signed: 06/29/2019 5:26:28 PM By: Linton Ham MD Entered By: Linton Ham on 06/29/2019 11:03:55 -------------------------------------------------------------------------------- Multi-Disciplinary Care Plan Details Patient Name: Date of Service: [name] ABBIGAEL, [place] 06/29/2019 9:30 AM Medical Record [id number] Patient Account Number: [id number] Date of Birth/Sex: Treating RN: 06/23/1980 (39 y.o. [name] Primary Care Ashlinn Hemrick: Julianne Handler Other Clinician: Referring Shaquna Geigle: Treating Jerrine Urschel/Extender:Robson, Raymondo Band, [facility] Weeks in Treatment: 2 Active Inactive Pressure Nursing Diagnoses: Knowledge deficit related to management of pressures ulcers Goals: Patient/caregiver will verbalize understanding of pressure ulcer management Date Initiated: 06/15/2019 Target Resolution Date: 07/13/2019 Goal Status: Active Interventions: Provide education on pressure ulcers Notes: Wound/Skin Impairment Nursing Diagnoses: Impaired tissue integrity Goals: Ulcer/skin breakdown will have a volume reduction of 50% by week 8 Date Initiated: 06/15/2019 Target Resolution Date: 07/27/2019 Goal Status: Active Interventions: Provide education on ulcer and skin care Notes: Electronic Signature(s) Signed: 06/29/2019 5:17:01 PM By: Kela Millin Entered By: Kela Millin on 06/29/2019 10:35:57 -------------------------------------------------------------------------------- Pain Assessment Details Patient Name: Date of Service: [name] Barnett, [name] Barnett 06/29/2019 9:30 AM Medical Record [id number] Patient Account Number: [id number] Date of Birth/Sex: Treating RN: Jan 31, 1981 (39 y.o. [name] Primary Care Wynonna Fitzhenry: Julianne Handler Other Clinician: Referring Celia Gibbons: Treating [place]ah Yeats/Extender:Robson, Raymondo Band, [facility] Weeks in Treatment: 2 Active Problems Location of Pain Severity and Description of Pain Patient Has Paino Yes Site Locations Pain Location: Pain in Ulcers With Dressing Change: Yes Duration of the Pain. Constant / Intermittento Constant Rate the pain. Current Pain Level: 8 Least Pain Level: 4 Character of Pain Describe the Pain: Aching, Tender, Other: sore Pain Management and Medication Current Pain Management: Medication: Yes Other: reposition Is the Current Pain Management Adequate: Adequate How does your wound impact your activities of daily livingo Sleep: No Bathing: No Appetite: No Relationship With Others: No Bladder Continence: No Emotions: No Bowel Continence: No Work: No Toileting: No Drive: No Dressing: No Hobbies: No Electronic Signature(s) Signed: 06/29/2019 5:01:55 PM By: [name] RN, BSN Entered By: [name] on 06/29/2019 09:57:25 -------------------------------------------------------------------------------- Patient/Caregiver Education Details Patient Name: Garlan Fair, [place] 2/26/2021andnbsp9:30 Date of Service: AM Medical Record [id number] Number: Patient Account Number: [id number] Date of Birth/Gender: 03-12-1981 (38 y.o. F) Treating RN: Kela Millin Other ClinicianJenna Luo, Primary Care Physician: Mariane Baumgarten Physician/ExtenderJenna Luo, Referring Physician: Ubaldo Glassing in Treatment: 2 Education Assessment Education Provided To: Patient Education Topics Provided Pressure: Methods: Explain/Verbal Responses: State content correctly Wound/Skin Impairment: Methods:  Explain/Verbal Responses: State content correctly Electronic Signature(s) Signed: 06/29/2019 5:17:01 PM By: Kela Millin Entered By: Kela Millin on 06/29/2019 10:36:12 -------------------------------------------------------------------------------- Wound Assessment Details Patient Name: Date  of Service: [name] Barnett, [name] Barnett 06/29/2019 9:30 AM Medical Record [id number] Patient Account Number: [id number] Date of Birth/Sex: Treating RN: [birth year] (39 y.o. [name] Primary Care Brookelle Pellicane: Julianne Handler Other Clinician: Referring Alvah Lagrow: Treating Fatime Biswell/Extender:Robson, Raymondo Band, [facility] Weeks in Treatment: 2 Wound Status Wound Number: 1 Primary Pressure Ulcer Etiology: Wound Location: Sacrum Secondary Necrotizing Infection Wounding Event: Pressure Injury Etiology: Date Acquired: 01/11/2019 Wound Open Weeks Of Treatment: 2 Status: Clustered Wound: No Comorbid Cataracts, Glaucoma, Anemia, Congestive History: Heart Failure, Coronary Artery Disease, Hypertension, Type II Diabetes, End Stage Renal Disease, Neuropathy, Confinement Anxiety Photos Wound Measurements Length: (cm) 7.8 % Reduction in Ar Width: (cm) 5.6 % Reduction in Vo Depth: (cm) 0.4 Epithelialization Area: (cm) 34.306 Tunneling: Volume: (cm) 13.722 Undermining: Wound Description Classification: Category/Stage IV Foul Odor After C Wound Margin: Epibole Slough/Fibrino Exudate Amount: Large Exudate Type: Purulent Exudate Color: yellow, brown, green Wound Bed Granulation Amount: Small (1-33%) Granulation Quality: Pink Fascia Exposed: Necrotic Amount: Large (67-100%) Fat Layer (Subcut Necrotic Quality: Adherent Slough Tendon Exposed: Muscle Exposed: Joint Exposed: Bone Exposed: leansing: No Yes Exposed Structure No aneous Tissue) Exposed: Yes No No No No ea: 9% lume: 54.5% : Small (1-33%) No No Treatment Notes Wound #1 (Sacrum) 1. Cleanse  With Wound Cleanser 2. Periwound Care Skin Prep 3. Primary Dressing Applied Hydrofera Blue 4. Secondary Dressing Foam Border Dressing 5. Secured With Self Adhesive Bandage Notes Hydrofera Blue Electronic Signature(s) Signed: 06/29/2019 5:12:28 PM By: Mikeal Hawthorne EMT/HBOT Signed: 06/29/2019 5:17:01 PM By: Kela Millin Entered By: Mikeal Hawthorne on 06/29/2019 15:59:27 -------------------------------------------------------------------------------- Vitals Details Patient Name: Date of Service: [name] Barnett, [place] 06/29/2019 9:30 AM Medical Record [id number] Patient Account Number: [id number] Date of Birth/Sex: Treating RN: 08/17/1980 (40 y.o. [name] Primary Care Aleanna Menge: Julianne Handler Other Clinician: Referring Missy Baksh: Treating Shereen Marton/Extender:Robson, Raymondo Band, [facility] Weeks in Treatment: 2 Vital Signs Time Taken: 09:55 Temperature (F): 98.3 Height (in): 65 Pulse (bpm): 105 Source: Stated Respiratory Rate (breaths/min): 18 Weight (lbs): 130 Blood Pressure (mmHg): 132/79 Source: Stated Capillary Blood Glucose (mg/dl): 162 Body Mass Index (BMI): 21.6 Reference Range: 80 - 120 mg / dl Notes glucose per pt report this am Electronic Signature(s) Signed: 06/29/2019 5:01:55 PM By: [name] RN, BSN Entered By: [name] on 06/29/2019 09:56:30

## 2019-07-06 ENCOUNTER — Encounter (HOSPITAL_BASED_OUTPATIENT_CLINIC_OR_DEPARTMENT_OTHER): Payer: Medicare Other | Attending: Internal Medicine | Admitting: Internal Medicine

## 2019-07-06 ENCOUNTER — Other Ambulatory Visit: Payer: Self-pay

## 2019-07-06 DIAGNOSIS — I69354 Hemiplegia and hemiparesis following cerebral infarction affecting left non-dominant side: Secondary | ICD-10-CM | POA: Insufficient documentation

## 2019-07-06 DIAGNOSIS — L89152 Pressure ulcer of sacral region, stage 2: Secondary | ICD-10-CM | POA: Diagnosis not present

## 2019-07-06 DIAGNOSIS — E1140 Type 2 diabetes mellitus with diabetic neuropathy, unspecified: Secondary | ICD-10-CM | POA: Diagnosis not present

## 2019-07-06 DIAGNOSIS — I2510 Atherosclerotic heart disease of native coronary artery without angina pectoris: Secondary | ICD-10-CM | POA: Insufficient documentation

## 2019-07-06 DIAGNOSIS — N186 End stage renal disease: Secondary | ICD-10-CM | POA: Diagnosis not present

## 2019-07-06 DIAGNOSIS — L89154 Pressure ulcer of sacral region, stage 4: Secondary | ICD-10-CM | POA: Diagnosis not present

## 2019-07-06 DIAGNOSIS — Z992 Dependence on renal dialysis: Secondary | ICD-10-CM | POA: Diagnosis not present

## 2019-07-06 DIAGNOSIS — I132 Hypertensive heart and chronic kidney disease with heart failure and with stage 5 chronic kidney disease, or end stage renal disease: Secondary | ICD-10-CM | POA: Insufficient documentation

## 2019-07-06 DIAGNOSIS — Z885 Allergy status to narcotic agent status: Secondary | ICD-10-CM | POA: Insufficient documentation

## 2019-07-06 DIAGNOSIS — E1122 Type 2 diabetes mellitus with diabetic chronic kidney disease: Secondary | ICD-10-CM | POA: Insufficient documentation

## 2019-07-06 DIAGNOSIS — I5032 Chronic diastolic (congestive) heart failure: Secondary | ICD-10-CM | POA: Insufficient documentation

## 2019-07-06 DIAGNOSIS — Z888 Allergy status to other drugs, medicaments and biological substances status: Secondary | ICD-10-CM | POA: Diagnosis not present

## 2019-07-06 NOTE — Progress Notes (Signed)
[name], FORQUER ([id number]) Visit Report for 07/06/2019 HPI Details Patient Name: Date of Service: [name], SLAVICK 07/06/2019 9:45 AM Medical Record [id number] Patient Account Number: [id number] Date of Birth/Sex: Treating RN: 10/20/1980 (39 y.o. [name] Primary Care Provider: Julianne Handler Other Clinician: Referring Provider: Treating Provider/Extender:Morrissa Shein, Raymondo Band, [facility] Weeks in Treatment: 3 History of Present Illness HPI Description: Admission 06/15/2019 This is a 39 year old woman who comes [name] [name]. I am able to follow a lot of her history on care everywhere in epic. The patient is a type II diabetic and has been on dialysis. 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 I am not exactly sure of the nature of the stroke at this present time. She has made a decent recovery and is now up walking with a walker. In August 2020 [name] [name] [facility] L4-L5 discitis. Culture and sensitivity showed Klebsiella. She was discharged on 8 weeks of ceftazidime. In mid September she is readmitted to [facility] with a pressure ulcer on her lower sacrum and necrotizing fasciitis. She required an extensive surgical debridement. CT scan of the pelvis at 1 point showed communication with the rectum. A colostomy was recommended and placed. Culture of this area showed Klebsiella and yeast. She was discharged on an extended course of meropenem and Diflucan. [facility] [name] We do not have any of these records. I think she was discharged to another nursing home but is come to [name] [name]. She was sent down here for review of the remaining wound. By review of the pictures that the patient had from September  this is made considerable improvement. She tells me that the wound VAC was discontinued at the end of December 2020. Currently the facility is using a form of silver alginate to the wound surface. Past medical history type 2 diabetes with chronic renal failure on dialysis Tuesday Thursday and Saturday. CVA with left hemiparesis in June 2020 but she appears to be making a good recovery. Chronic diastolic heart failure, hypertension 2/19; patient's wound area slightly smaller. The area that was towards her anus has closed over. We are using Santyl covered with Hydrofera Blue. We have not been able to get her lab work apparently her veins are very difficult for phlebotomy. They are trying to get the blood work done where she dialyzes in [name] but that requires some logistical issues. 2/26; not much change in the wound. Still a nonviable surface we have been using Santyl and Hydrofera Blue. I changed her to [facility] only. She tells me she has rigorously offloading this area she also complains of some pain 3/5; still no change here. I switch to pure Hydrofera Blue. She still complains of a lot of pain although is a big wound it seems out of proportion to what I might expect. This was initially a surgical wound secondary to necrotizing wound infection. She is on dialysis. Other than that she thinks she is offloading this fairly rigorously. She has not been systemically unwell Electronic Signature(s) Signed: 07/06/2019 5:21:33 PM By: Linton Ham MD Entered By: Linton Ham on 07/06/2019 11:34:08 -------------------------------------------------------------------------------- Physical Exam Details Patient Name: Date of Service: [name], [name] 07/06/2019 9:45 AM Medical Record [id number] Patient Account Number: [id number] Date of Birth/Sex: Treating RN: Jun 06, 1980 (39 y.o. F) Dwiggins,  [place]arene [place] Primary Care Provider: Julianne Handler Other  Clinician: Referring Provider: Treating Provider/Extender:Luan Urbani, Raymondo Band, SHAR[facility] Weeks in Treatment: 3 Constitutional Sitting or standing Blood Pressure is within target range for patient.. Pulse regular and within target range for patient.Marland Kitchen Respirations regular, non-labored and within target range.. Temperature is normal and within the target range for the patient.Marland Kitchen Appears in no distress. Notes Wound exam The area in question is on the lower sacrum. Surface not any better still very fibrinous covered. She is very reluctant to allow debridement. Although she needs debridement I did not push this issue today. Careful examination of the area shows that the top part of the wound is not that tender however you get down to the lower part which I think is the lower part of her sacrum extending into the coccyx she is exceptionally tender. There is no obvious erythema around the wound certainly no soft tissue crepitus. Electronic Signature(s) Signed: 07/06/2019 5:21:33 PM By: Linton Ham MD Entered By: Linton Ham on 07/06/2019 11:35:24 -------------------------------------------------------------------------------- Physician Orders Details Patient Name: Date of Service: [name], MARZANO 07/06/2019 9:45 AM Medical Record [id number] Patient Account Number: [id number] Date of Birth/Sex: Treating RN: 03-08-1981 (39 y.o. [name] Primary Care Provider: Julianne Handler Other Clinician: Referring Provider: Treating Provider/Extender:Rasheen Bells, Raymondo Band, [facility] Weeks in Treatment: 3 Verbal / Phone Orders: No Diagnosis Coding Follow-up Appointments Return Appointment in 1 week. Dressing Change Frequency Wound #1 Sacrum Change dressing every day. Skin Barriers/Peri-Wound Care Wound #1 Sacrum Skin Prep Wound Cleansing Wound #1 Sacrum Clean wound with Wound Cleanser - or normal saline Primary Wound Dressing Wound #1 Sacrum Iodoflex -  or iodosorb gel (may use hydrofera blue until iodoflex available) Secondary Dressing Wound #1 Sacrum Foam Border - or ABD Off-Loading Turn and reposition every 2 hours Radiology X-ray, coccyx and sacrum - facility to do xray for nonhealing sacral wound with increased pain Electronic Signature(s) Signed: 07/06/2019 5:21:33 PM By: Linton Ham MD Signed: 07/06/2019 5:33:53 PM By: [name] RN, BSN Entered By: [name] on 07/06/2019 10:55:02 -------------------------------------------------------------------------------- Prescription 07/06/2019 Patient Name: Garlan Fair, Evaline Provider: Linton Ham MD Date of Birth: 1980/08/18 NPI#: [id number] Sex: F DEA#: [id number] Phone #: [phone] License #: [id number] Patient Address: [address] [place], [place] [address] [address], [place] [address] [id number] Allergies lisinopril Reaction: hair falls out, lip swelling Severity: Moderate morphine Reaction: hallucinations corn Reaction: throat swelling Provider's Orders X-ray, coccyx and sacrum - facility to do xray for nonhealing sacral wound with increased pain Signature(s): Date(s): Electronic Signature(s) Signed: 07/06/2019 5:21:33 PM By: Linton Ham MD Signed: 07/06/2019 5:33:53 PM By: [name] RN, BSN Entered By: [name] on 07/06/2019 10:55:02 --------------------------------------------------------------------------------  Problem List Details Patient Name: Date of Service: [name], LANDRY 07/06/2019 9:45 AM Medical Record [id number] Patient Account Number: [id number] Date of Birth/Sex: Treating RN: 28-Dec-1980 (39 y.o. [name] Primary Care Provider: Julianne Handler Other Clinician: Referring Provider: Treating Provider/Extender:Zarian Colpitts, Raymondo Band, [facility] Weeks in Treatment: 3 Active  Problems ICD-10 Evaluated Encounter Code Description Active Date Today Diagnosis L89.154 Pressure ulcer of sacral region, stage 4 06/15/2019 No Yes L89.152 Pressure ulcer of sacral region, stage 2 06/15/2019 No Yes T81.31XD Disruption of external operation (surgical) wound, not 06/15/2019 No Yes elsewhere classified, subsequent encounter M72.6 Necrotizing fasciitis 06/15/2019 No Yes Inactive Problems Resolved Problems Electronic Signature(s) Signed: 07/06/2019 5:21:33 PM By: Linton Ham MD Entered By: Linton Ham on 07/06/2019 11:32:06 --------------------------------------------------------------------------------  Progress Note Details Patient Name: Date of Service: [name] EMMELINE, WINEBARGER 07/06/2019 9:45 AM Medical Record [id number] Patient Account Number: [id number] Date of Birth/Sex: Treating RN: 1980/05/17 (39 y.o. [name] Primary Care Provider: Julianne Handler Other Clinician: Referring Provider: Treating Provider/Extender:Kashmere Daywalt, Raymondo Band, [facility] Weeks in Treatment: 3 Subjective History of Present Illness (HPI) Admission 06/15/2019 This is a 39 year old woman who comes to [name] from [facility] skilled facility in [facility]. I am able to follow a lot of her history on care everywhere in epic. The patient is a type II diabetic and has been on dialysis. 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 I am not exactly sure of the nature of the stroke at this present time. She has made a decent recovery and is now up walking with a walker. In August 2020 [name] [place] with L4-L5 discitis. Culture and sensitivity showed Klebsiella. She was discharged on 8 weeks of ceftazidime. In mid September she is readmitted to the [facility] with a pressure ulcer on her lower sacrum and necrotizing fasciitis. She required an extensive  surgical debridement. CT scan of the pelvis at 1 point showed communication with the rectum. A colostomy was recommended and placed. Culture of this area showed Klebsiella and yeast. She was discharged on an extended course of meropenem and Diflucan. [facility] [place] We do not have any of these records. I think she was discharged to another nursing home but is come to [facility] facility in [facility]. She was sent down here for review of the remaining wound. By review of the pictures that the patient had from September this is made considerable improvement. She tells me that the wound VAC was discontinued at the end of December 2020. Currently the facility is using a form of silver alginate to the wound surface. Past medical history type 2 diabetes with chronic renal failure on dialysis Tuesday Thursday and Saturday. CVA with left hemiparesis in June 2020 but she appears to be making a good recovery. Chronic diastolic heart failure, hypertension 2/19; patient's wound area slightly smaller. The area that was towards her anus has closed over. We are using Santyl covered with Hydrofera Blue. We have not been able to get her lab work apparently her veins are very difficult for phlebotomy. They are trying to get the blood work done where she dialyzes in [place] [facility] but that requires some logistical issues. 2/26; not much change in the wound. Still a nonviable surface we have been using Santyl and Hydrofera Blue. I changed her to [facility] only. She tells me she has rigorously offloading this area she also complains of some pain 3/5; still no change here. I switch to pure Hydrofera Blue. She still complains of a lot of pain although is a big wound it seems out of proportion to what I might expect. This was initially a surgical wound secondary to necrotizing wound infection. She is on dialysis. Other than that she thinks  she is offloading this fairly rigorously. She has not been systemically unwell Objective Constitutional Sitting or standing Blood Pressure is within target range for patient.. Pulse regular and within target range for patient.Marland Kitchen Respirations regular, non-labored and within target range.. Temperature is normal and within the target range for the patient.Marland Kitchen Appears in no distress. Vitals Time Taken: 10:07 AM, Height: 65 in, Weight: 130 lbs, BMI:  21.6, Temperature: 98.1 F, Pulse: 97 bpm, Respiratory Rate: 18 breaths/min, Blood Pressure: 112/75 mmHg. General Notes: Wound exam ooThe area in question is on the lower sacrum. Surface not any better still very fibrinous covered. She is very reluctant to allow debridement. Although she needs debridement I did not push this issue today. Careful examination of the area shows that the top part of the wound is not that tender however you get down to the lower part which I think is the lower part of her sacrum extending into the coccyx she is exceptionally tender. There is no obvious erythema around the wound certainly no soft tissue crepitus. Integumentary (Hair, Skin) Wound #1 status is Open. Original cause of wound was Pressure Injury. The wound is located on the Sacrum. The wound measures 7cm length x 4cm width x 0.6cm depth; 21.991cm^2 area and 13.195cm^3 volume. There is Fat Layer (Subcutaneous Tissue) Exposed exposed. There is no tunneling or undermining noted. There is a medium amount of serosanguineous drainage noted. The wound margin is epibole. There is small (1-33%) pink granulation within the wound bed. There is a large (67-100%) amount of necrotic tissue within the wound bed including Adherent Slough. Assessment Active Problems ICD-10 Pressure ulcer of sacral region, stage 4 Pressure ulcer of sacral region, stage 2 Disruption of external operation (surgical) wound, not elsewhere classified, subsequent encounter Necrotizing  fasciitis Plan Follow-up Appointments: Return Appointment in 1 week. Dressing Change Frequency: Wound #1 Sacrum: Change dressing every day. Skin Barriers/Peri-Wound Care: Wound #1 Sacrum: Skin Prep Wound Cleansing: Wound #1 Sacrum: Clean wound with Wound Cleanser - or normal saline Primary Wound Dressing: Wound #1 Sacrum: Iodoflex - or iodosorb gel (may use hydrofera blue until iodoflex available) Secondary Dressing: Wound #1 Sacrum: Foam Border - or ABD Off-Loading: Turn and reposition every 2 hours Radiology ordered were: X-ray, coccyx and sacrum - facility to do xray for nonhealing sacral wound with increased pain 1. We are going to change the dressing to Iodoflex or Iodosorb gauze dressings which can be changed every 2 days. 2. The facility can continue [facility] until they obtain the new dressing. 3. The purpose of this changes because of the nonimproving surface of the wound . This did not really respond to Santyl plus or minus Hydrofera Blue or Hydrofera Blue alone 4. At some point she is going to need a more aggressive debridement 5. I have ordered an x-ray of the area just to make sure there is no underlying infection, soft tissue gas etc. 6. She may require lab work including a CBC with differential sedimentation rate and C-reactive protein she is not running a fever and does not appear to be systemically unwell Electronic Signature(s) Signed: 07/06/2019 5:21:33 PM By: Linton Ham MD Entered By: Linton Ham on 07/06/2019 11:37:01 -------------------------------------------------------------------------------- SuperBill Details Patient Name: Date of Service: [name], KINSER 07/06/2019 Medical Record [id number] Patient Account Number: [id number] Date of Birth/Sex: Treating RN: [birth year] (39 y.o. [name] Primary Care Provider: Julianne Handler Other Clinician: Referring Provider: Treating Provider/Extender:Peni Rupard,  Raymondo Band, [facility] Weeks in Treatment: 3 Diagnosis Coding ICD-10 Codes Code Description L89.154 Pressure ulcer of sacral region, stage 4 L89.152 Pressure ulcer of sacral region, stage 2 Disruption of external operation (surgical) wound, not elsewhere classified, subsequent T81.31XD encounter M72.6 Necrotizing fasciitis Facility Procedures CPT4 Code: [id number] Description: 99213 - WOUND CARE VISIT-LEV 3 EST PT Modifier: Quantity: 1 Physician Procedures CPT4: Code [id number] Description: 214 - WC PHYS LEVEL 4 - EST PT ICD-10 Diagnosis Description L89.154 Pressure  ulcer of sacral region, stage 4 L89.152 Pressure ulcer of sacral region, stage 2 T81.31XD Disruption of external operation (surgical) wound, not e subsequent  encounter M72.6 Necrotizing fasciitis Modifier: lsewhere classifi Quantity: 1 ed, Electronic Signature(s) Signed: 07/06/2019 5:21:33 PM By: Linton Ham MD Entered By: Linton Ham on 07/06/2019 11:37:24

## 2019-07-11 NOTE — Progress Notes (Signed)
[name] Barnett, [name] Barnett ([id number]) Visit Report for 07/06/2019 Arrival Information Details Patient Name: Date of Service: [name] Barnett, [name] Barnett 07/06/2019 9:45 AM Medical Record [id number] Patient Account Number: [id number] Date of Birth/Sex: Treating RN: 03-21-1981 (39 y.o. [name] Primary Care Stephanee Barcomb: Julianne Handler Other Clinician: Referring Osker Ayoub: Treating Juwon Scripter/Extender:Robson, Raymondo Band, SHARMIN Weeks in Treatment: 3 Visit Information History Since Last Visit All ordered tests and consults were completed: No Patient Arrived: Wheel Chair Added or deleted any medications: No Arrival Time: 10:04 Any new allergies or adverse reactions: No Accompanied By: caregiver Had a fall or experienced change in No Transfer Assistance: EasyPivot activities of daily living that may affect Patient Lift risk of falls: Patient Identification Verified: Yes Signs or symptoms of abuse/neglect since last No Secondary Verification Process Yes visito Completed: Hospitalized since last visit: No Patient Requires Transmission-Based No Implantable device outside of the clinic excluding No Precautions: cellular tissue based products placed in the center Patient Has Alerts: No since last visit: Has Dressing in Place as Prescribed: Yes Pain Present Now: Yes Electronic Signature(s) Signed: 07/11/2019 7:20:02 AM By: Carlene Coria RN Entered By: Carlene Coria on 07/06/2019 10:06:52 -------------------------------------------------------------------------------- Clinic Level of Care Assessment Details Patient Name: Date of Service: [name] Barnett, [name] Barnett 07/06/2019 9:45 AM Medical Record [id number] Patient Account Number: [id number] Date of Birth/Sex: Treating RN: October 16, 1980 (39 y.o. [name] Primary Care Delbert Darley: Julianne Handler Other Clinician: Referring Cherish Runde: Treating Zayquan Bogard/Extender:Robson, Raymondo Band, [facility] Weeks  in Treatment: 3 Clinic Level of Care Assessment Items TOOL 4 Quantity Score []  - Use when only an EandM is performed on FOLLOW-UP visit 0 ASSESSMENTS - Nursing Assessment / Reassessment X - Reassessment of Co-morbidities (includes updates in patient status) 1 10 X - Reassessment of Adherence to Treatment Plan 1 5 ASSESSMENTS - Wound and Skin Assessment / Reassessment X - Simple Wound Assessment / Reassessment - one wound 1 5 []  - Complex Wound Assessment / Reassessment - multiple wounds 0 []  - Dermatologic / Skin Assessment (not related to wound area) 0 ASSESSMENTS - Focused Assessment []  - Circumferential Edema Measurements - multi extremities 0 []  - Nutritional Assessment / Counseling / Intervention 0 []  - Lower Extremity Assessment (monofilament, tuning fork, pulses) 0 []  - Peripheral Arterial Disease Assessment (using hand held doppler) 0 ASSESSMENTS - Ostomy and/or Continence Assessment and Care []  - Incontinence Assessment and Management 0 []  - Ostomy Care Assessment and Management (repouching, etc.) 0 PROCESS - Coordination of Care X - Simple Patient / Family Education for ongoing care 1 15 []  - Complex (extensive) Patient / Family Education for ongoing care 0 X - Staff obtains Programmer, systems, Records, Test Results / Process Orders 1 10 X - Staff telephones HHA, Nursing Homes / Clarify orders / etc 1 10 []  - Routine Transfer to another Facility (non-emergent condition) 0 []  - Routine Hospital Admission (non-emergent condition) 0 []  - New Admissions / Biomedical engineer / Ordering NPWT, Apligraf, etc. 0 []  - Emergency Hospital Admission (emergent condition) 0 X - Simple Discharge Coordination 1 10 []  - Complex (extensive) Discharge Coordination 0 PROCESS - Special Needs []  - Pediatric / Minor Patient Management 0 []  - Isolation Patient Management 0 []  - Hearing / Language / Visual special needs 0 []  - Assessment of Community assistance (transportation, D/C planning, etc.)  0 []  - Additional assistance / Altered mentation 0 []  - Support Surface(s) Assessment (bed, cushion, seat, etc.) 0 INTERVENTIONS - Wound Cleansing / Measurement X - Simple Wound Cleansing - one wound 1 5 []  - Complex  Wound Cleansing - multiple wounds 0 X - Wound Imaging (photographs - any number of wounds) 1 5 []  - Wound Tracing (instead of photographs) 0 X - Simple Wound Measurement - one wound 1 5 []  - Complex Wound Measurement - multiple wounds 0 INTERVENTIONS - Wound Dressings X - Small Wound Dressing one or multiple wounds 1 10 []  - Medium Wound Dressing one or multiple wounds 0 []  - Large Wound Dressing one or multiple wounds 0 X - Application of Medications - topical 1 5 []  - Application of Medications - injection 0 INTERVENTIONS - Miscellaneous []  - External ear exam 0 []  - Specimen Collection (cultures, biopsies, blood, body fluids, etc.) 0 []  - Specimen(s) / Culture(s) sent or taken to Lab for analysis 0 []  - Patient Transfer (multiple staff / Civil Service fast streamer / Similar devices) 0 []  - Simple Staple / Suture removal (25 or less) 0 []  - Complex Staple / Suture removal (26 or more) 0 []  - Hypo / Hyperglycemic Management (close monitor of Blood Glucose) 0 []  - Ankle / Brachial Index (ABI) - do not check if billed separately 0 X - Vital Signs 1 5 Has the patient been seen at the hospital within the last three years: Yes Total Score: 100 Level Of Care: New/Established - Level 3 Electronic Signature(s) Signed: 07/06/2019 5:33:53 PM By: [name] RN, BSN Entered By: [name] on 07/06/2019 10:55:39 -------------------------------------------------------------------------------- Multi Wound Chart Details Patient Name: Date of Service: [name] Barnett, [name] Barnett 07/06/2019 9:45 AM Medical Record [id number] Patient Account Number: [id number] Date of Birth/Sex: Treating RN: [birth year] (39 y.o. 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] [N/A:N/A] Comorbid History: [1:Cataracts, Glaucoma, Anemia, Congestive Heart Failure, Coronary Artery Disease, Hypertension, Type II Diabetes, End Stage Renal Disease, Neuropathy, Confinement Anxiety] [N/A:N/A] Date Acquired: [1:01/11/2019] [N/A:N/A] Weeks of Treatment: [1:3] [N/A:N/A] Wound Status: [1:Open] [N/A:N/A] Measurements L x W x D 7x4x0.6 [N/A:N/A] (cm) Area (cm) : [1:21.991] [N/A:N/A] Volume (cm) : [1:13.195] [N/A:N/A] % Reduction in Area: [1:41.70%] [N/A:N/A] % Reduction in Volume: 56.20% [N/A:N/A] Classification: [1:Category/Stage IV] [N/A:N/A] Exudate Amount: [1:Medium] [N/A:N/A] Exudate Type: [1:Serosanguineous] [N/A:N/A] Exudate Color: [1:red, brown] [N/A:N/A] Wound Margin: [1:Epibole] [N/A:N/A] Granulation Amount: [1:Small (1-33%)] [N/A:N/A] Granulation Quality: [1:Pink] [N/A:N/A] Necrotic Amount: [1:Large (67-100%)] [N/A:N/A] Exposed Structures: [1:Fat Layer (Subcutaneous N/A Tissue) Exposed: Yes Fascia: No Tendon: No Muscle: No Joint: No Bone: No Small (1-33%)] [N/A:N/A] Treatment Notes Electronic Signature(s) Signed: 07/06/2019 5:21:33 PM By: Linton Ham MD Signed: 07/09/2019 5:31:36 PM By: Kela Millin Entered By: Linton Ham on 07/06/2019 11:32:12 -------------------------------------------------------------------------------- [place] Details Patient Name: Date of Service: [name] Barnett, [name] Barnett 07/06/2019 9:45 AM Medical Record  [id number] Patient Account Number: [id number] Date of Birth/Sex: Treating RN: [birth year] (39 y.o. [name] Primary Care Ayanni Tun: Julianne Handler Other Clinician: Referring Jairon Ripberger: Treating Taryll Reichenberger/Extender:Robson, Raymondo Band, [facility] Weeks in Treatment: 3 Active Inactive Pressure Nursing Diagnoses: Knowledge deficit related to management of pressures ulcers Goals: Patient/caregiver will verbalize understanding of pressure ulcer management Date Initiated: 06/15/2019 Target Resolution Date: 07/13/2019 Goal Status: Active Interventions: Provide education on pressure ulcers Notes: Wound/Skin Impairment Nursing Diagnoses: Impaired tissue integrity Goals: Ulcer/skin breakdown will have a volume reduction of 50% by week 8 Date Initiated:  06/15/2019 Target Resolution Date: 07/27/2019 Goal Status: Active Interventions: Provide education on ulcer and skin care Notes: Electronic Signature(s) Signed: 07/06/2019 5:33:53 PM By: [name] RN, BSN Entered By: [name] on 07/06/2019 10:17:06 -------------------------------------------------------------------------------- Pain Assessment Details Patient Name: Date of Service: [name] Barnett, [name] Barnett 07/06/2019 9:45 AM Medical Record [id number] Patient Account Number: [id number] Date of Birth/Sex: Treating RN: [birth year] (39 y.o. Orvan Falconer Primary Care Jerrod Damiano: Julianne Handler Other Clinician: Referring Agatha Duplechain: Treating Taevion Sikora/Extender:Robson, Raymondo Band, [facility] Weeks in Treatment: 3 Active Problems Location of Pain Severity and Description of Pain Patient Has Paino Yes Site Locations With Dressing Change: Yes Rate the pain. Current Pain Level: 10 Worst Pain Level: 10 Least Pain Level: 10 Tolerable Pain Level: 5 Character of Pain Describe the Pain: Aching Pain Management and Medication Current Pain Management: Medication: Yes Cold Application:  No Rest: Yes Massage: No Activity: No T.E.N.S.: No Heat Application: No Leg drop or elevation: No Is the Current Pain Management Adequate: Inadequate How does your wound impact your activities of daily livingo Sleep: Yes Bathing: No Appetite: Yes Relationship With Others: No Bladder Continence: No Emotions: No Bowel Continence: No Work: No Toileting: No Drive: No Dressing: No Hobbies: No Electronic Signature(s) Signed: 07/11/2019 7:20:02 AM By: Carlene Coria RN Entered By: Carlene Coria on 07/06/2019 10:09:41 -------------------------------------------------------------------------------- Patient/Caregiver Education Details Patient Name: [name] Barnett, [name] Barnett 3/5/2021andnbsp9:45 Date of Service: AM Medical Record [id number] Number: Patient Account Number: [id number] Date of Birth/Gender: [birth year] (38 y.o. F) Treating RN: [name] Other Clinician: Primary Care Physician: Julianne Handler Treating Linton Ham Physician/ExtenderJenna Luo, Referring Physician: Ubaldo Glassing in Treatment: 3 Education Assessment Education Provided To: Patient Education Topics Provided Pressure: Methods: Explain/Verbal Responses: Reinforcements needed, State content correctly Wound/Skin Impairment: Methods: Explain/Verbal Responses: Reinforcements needed, State content correctly Electronic Signature(s) Signed: 07/06/2019 5:33:53 PM By: [name] RN, BSN Entered By: [name] on 07/06/2019 10:17:31 -------------------------------------------------------------------------------- Wound Assessment Details Patient Name: Date of Service: [name] Barnett, [name] Barnett 07/06/2019 9:45 AM Medical Record [id number] Patient Account Number: [id number] Date of Birth/Sex: Treating RN: February 14, 1981 (39 y.o. [name] Primary Care Katja Blue: Julianne Handler Other Clinician: Referring Antwaine Boomhower: Treating Shaylah Mcghie/Extender:Robson, Raymondo Band,  SHARMIN Weeks in Treatment: 3 Wound Status Wound Number: 1 Primary Pressure Ulcer Etiology: Wound Location: Sacrum Secondary Necrotizing Infection Wounding Event: Pressure Injury Etiology: Etiology: Date Acquired: 01/11/2019 Wound Open Weeks Of Treatment: 3 Status: Clustered Wound: No Comorbid Cataracts, Glaucoma, Anemia, Congestive History: Heart Failure, Coronary Artery Disease, Hypertension, Type II Diabetes, End Stage Renal Disease, Neuropathy, Confinement Anxiety Photos Wound Measurements Length: (cm) 7 Width: (cm) 4 Depth: (cm) 0.6 Area: (cm) 21.991 Volume: (cm) 13.195 Wound Description Classification: Category/Stage IV Wound Margin: Epibole Exudate Amount: Medium Exudate Type: Serosanguineous Exudate Color: red, brown Wound Bed Granulation Amount: Small (1-33%) Granulation Quality: Pink Necrotic Amount: Large (67-100%) Necrotic Quality: Adherent Slough fter Cleansing: No ino Yes Exposed Structure ed: No ubcutaneous Tissue) Exposed: Yes ed: No ed: No d: No : No % Reduction in Area: 41.7% % Reduction in Volume: 56.2% Epithelialization: Small (1-33%) Tunneling: No Undermining: No Foul Odor A Slough/Fibr Fascia Expos Fat Layer (S Tendon Expos Muscle Expos Joint Expose Bone Exposed Electronic Signature(s) Signed: 07/06/2019 4:01:29 PM By: Mikeal Hawthorne EMT/HBOT Signed: 07/09/2019 5:31:36 PM By: Kela Millin Entered By: Mikeal Hawthorne on 07/06/2019 15:36:07 -------------------------------------------------------------------------------- Vitals Details Patient Name: Date of Service: [name] Barnett, [name] Barnett 07/06/2019 9:45 AM Medical Record [id number] Patient Account Number: [id number] Date of Birth/Sex: Treating RN: 04/03/1981 (39 y.o. Orvan Falconer Primary Care Jasun Gasparini: Other Clinician: Julianne Handler  Referring Zerick Prevette: Treating Filippo Puls/Extender:Robson, Raymondo Band, SHARMIN Weeks in Treatment: 3 Vital Signs Time  Taken: 10:07 Temperature (F): 98.1 Height (in): 65 Pulse (bpm): 97 Weight (lbs): 130 Respiratory Rate (breaths/min): 18 Body Mass Index (BMI): 21.6 Blood Pressure (mmHg): 112/75 Reference Range: 80 - 120 mg / dl Electronic Signature(s) Signed: 07/11/2019 7:20:02 AM By: Carlene Coria RN Entered By: Carlene Coria on 07/06/2019 10:08:27

## 2019-07-13 ENCOUNTER — Encounter (HOSPITAL_BASED_OUTPATIENT_CLINIC_OR_DEPARTMENT_OTHER): Payer: Medicare Other | Admitting: Internal Medicine

## 2019-07-27 ENCOUNTER — Encounter (HOSPITAL_BASED_OUTPATIENT_CLINIC_OR_DEPARTMENT_OTHER): Payer: Medicare Other | Admitting: Internal Medicine

## 2019-07-27 ENCOUNTER — Other Ambulatory Visit: Payer: Self-pay

## 2019-07-27 DIAGNOSIS — L89154 Pressure ulcer of sacral region, stage 4: Secondary | ICD-10-CM | POA: Diagnosis not present

## 2019-07-28 NOTE — Progress Notes (Signed)
[name] Barnett, [name] Barnett ([id number]) Visit Report for 07/27/2019 HPI Details Patient Name: Date of Service: [name] Barnett, [name] Barnett 07/27/2019 9:00 AM Medical Record [id number] Patient Account Number: [id number] Date of Birth/Sex: Treating RN: 17-Feb-1981 (39 y.o. F) Primary Care Provider: Julianne Handler Other Clinician: Referring Provider: Treating Provider/Extender:Nobie Alleyne, Raymondo Band, [facility] Weeks in Treatment: 6 History of Present Illness HPI Description: Admission 06/15/2019 This is a 39 year old woman who comes to [place] from [facility] skilled facility in [place]. I am able to follow a lot of her history on care everywhere in epic. The patient is a type II diabetic and has been on dialysis. 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 I am not exactly sure of the nature of the stroke at this present time. She has made a decent recovery and is now up walking with a walker. In August 2020 she was admitted to [facility] in [place] with L4-L5 discitis. Culture and sensitivity showed Klebsiella. She was discharged on 8 weeks of ceftazidime. In mid September she is readmitted to the hospital with a pressure ulcer on her lower sacrum and necrotizing fasciitis. She required an extensive surgical debridement. CT scan of the pelvis at 1 point showed communication with the rectum. A colostomy was recommended and placed. Culture of this area showed Klebsiella and yeast. She was discharged on an extended course of meropenem and Diflucan. [facility] [place] We do not have any of these records. I think she was discharged to another nursing home but is come to [facility] skilled facility in [facility]. She was sent down here for review of the remaining wound. By review of the pictures that the patient had from September this is made  considerable improvement. She tells me that the wound VAC was discontinued at the end of December 2020. Currently the facility is using a form of silver alginate to the wound surface. Past medical history type 2 diabetes with chronic renal failure on dialysis Tuesday Thursday and Saturday. CVA with left hemiparesis in June 2020 but she appears to be making a good recovery. Chronic diastolic heart failure, hypertension 2/19; patient's wound area slightly smaller. The area that was towards her anus has closed over. We are using Santyl covered with Hydrofera Blue. We have not been able to get her lab work apparently her veins are very difficult for phlebotomy. They are trying to get the blood work done where she dialyzes in [facility] but that requires some logistical issues. 2/26; not much change in the wound. Still a nonviable surface we have been using Santyl and Hydrofera Blue. I changed her to [facility] only. She tells me she has rigorously offloading this area she also complains of some pain 3/5; still no change here. I switch to pure Hydrofera Blue. She still complains of a lot of pain although is a big wound it seems out of proportion to what I might expect. This was initially a surgical wound secondary to necrotizing wound infection. She is on dialysis. Other than that she thinks she is offloading this fairly rigorously. She has not been systemically unwell 3/26; I switched to Iodoflex 3 weeks ago. This really seems to have helped, much better surface and slight improvement in the surface area. Electronic Signature(s) Signed: 07/28/2019 8:02:09 AM By: Linton Ham MD Entered By: Linton Ham on 07/27/2019 09:37:17 -------------------------------------------------------------------------------- Physical Exam Details Patient Name: Date of Service: [name]  Barnett, [name] Barnett 07/27/2019 9:00 AM Medical Record [id number] Patient Account Number: [id number] Date of  Birth/Sex: Treating RN: 08-22-1980 (39 y.o. F) Primary Care Provider: Julianne Handler Other Clinician: Referring Provider: Treating Provider/Extender:Lauriann Milillo, Raymondo Band, [facility] Weeks in Treatment: 6 Constitutional Patient is hypertensive.. Pulse regular and within target range for patient.Marland Kitchen Respirations regular, non-labored and within target range.. Temperature is normal and within the target range for the patient.Marland Kitchen Appears in no distress. Notes Wound exam; the area in question is on the lower sacrum. She has some new epithelialization coming from roughly 6-9 o'clock. This is exciting. I used an open curette there is no tactile fibrinous debris on the surface which is quite an improvement from when she was last here. Therefore no mechanical debridement was necessary. There is no evidence of surrounding infection Electronic Signature(s) Signed: 07/28/2019 8:02:09 AM By: Linton Ham MD Entered By: Linton Ham on 07/27/2019 09:38:27 -------------------------------------------------------------------------------- Physician Orders Details Patient Name: Date of Service: [name] Barnett, [name] Barnett 07/27/2019 9:00 AM Medical Record [id number] Patient Account Number: [id number] Date of Birth/Sex: Treating RN: Jan 15, 1981 (39 y.o. [name] Primary Care Provider: Julianne Handler Other Clinician: Referring Provider: Treating Provider/Extender:Porcia Morganti, Raymondo Band, [facility] Weeks in Treatment: 6 Verbal / Phone Orders: No Diagnosis Coding ICD-10 Coding Code Description L89.154 Pressure ulcer of sacral region, stage 4 L89.152 Pressure ulcer of sacral region, stage 2 Disruption of external operation (surgical) wound, not elsewhere classified, subsequent T81.31XD encounter M72.6 Necrotizing fasciitis Follow-up Appointments Return Appointment in 2 weeks. - Friday Dressing Change Frequency Wound #1 Sacrum Change dressing every day. Skin  Barriers/Peri-Wound Care Wound #1 Sacrum Skin Prep Wound Cleansing Wound #1 Sacrum Clean wound with Wound Cleanser - or normal saline Primary Wound Dressing Wound #1 Sacrum Iodoflex - or iodosorb gel (may use hydrofera blue until iodoflex available) Secondary Dressing Wound #1 Sacrum Foam Border - or ABD Off-Loading Turn and reposition every 2 hours Electronic Signature(s) Signed: 07/27/2019 5:41:24 PM By: Kela Millin Signed: 07/28/2019 8:02:09 AM By: Linton Ham MD Entered By: Kela Millin on 07/27/2019 09:28:29 -------------------------------------------------------------------------------- Problem List Details Patient Name: Date of Service: [name] Barnett, [name] Barnett 07/27/2019 9:00 AM Medical Record [id number] Patient Account Number: [id number] Date of Birth/Sex: Treating RN: 10/13/1980 (39 y.o. [name] Primary Care Provider: Julianne Handler Other Clinician: Referring Provider: Treating Provider/Extender:Loreta Blouch, Raymondo Band, [facility] Weeks in Treatment: 6 Active Problems ICD-10 Evaluated Encounter Code Description Active Date Today Diagnosis L89.154 Pressure ulcer of sacral region, stage 4 06/15/2019 No Yes L89.152 Pressure ulcer of sacral region, stage 2 06/15/2019 No Yes T81.31XD Disruption of external operation (surgical) wound, not 06/15/2019 No Yes elsewhere classified, subsequent encounter M72.6 Necrotizing fasciitis 06/15/2019 No Yes Inactive Problems Resolved Problems Electronic Signature(s) Signed: 07/28/2019 8:02:09 AM By: Linton Ham MD Entered By: Linton Ham on 07/27/2019 09:35:21 -------------------------------------------------------------------------------- Progress Note Details Patient Name: Date of Service: [name] Barnett, [name] Barnett 07/27/2019 9:00 AM Medical Record [id number] Patient Account Number: [id number] Date of Birth/Sex: Treating RN: Mar 27, 1981 (39 y.o. F) Primary Care Provider:  Julianne Handler Other Clinician: Referring Provider: Treating Provider/Extender:Eudell Mcphee, Raymondo Band, [facility] Weeks in Treatment: 6 Subjective History of Present Illness (HPI) Admission 06/15/2019 This is a 39 year old woman who comes to [place] from [facility] skilled facility in [facility]. I am able to follow a lot of her history on care everywhere in epic. The patient is a type II diabetic and has been on dialysis. 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 I am not exactly sure of  the nature of the stroke at this present time. She has made a decent recovery and is now up walking with a walker. In August 2020 she was admitted to [facility] in [place] with L4-L5 discitis. Culture and sensitivity showed Klebsiella. She was discharged on 8 weeks of ceftazidime. In mid September she is readmitted to the hospital with a pressure ulcer on her lower sacrum and necrotizing fasciitis. She required an extensive surgical debridement. CT scan of the pelvis at 1 point showed communication with the rectum. A colostomy was recommended and placed. Culture of this area showed Klebsiella and yeast. She was discharged on an extended course of meropenem and Diflucan. [facility] [place] We do not have any of these records. I think she was discharged to another nursing home but is come to [facility] skilled facility in [facility]. She was sent down here for review of the remaining wound. By review of the pictures that the patient had from September this is made considerable improvement. She tells me that the wound VAC was discontinued at the end of December 2020. Currently the facility is using a form of silver alginate to the wound surface. Past medical history type 2 diabetes with chronic renal failure on dialysis Tuesday Thursday and Saturday. CVA with  left hemiparesis in June 2020 but she appears to be making a good recovery. Chronic diastolic heart failure, hypertension 2/19; patient's wound area slightly smaller. The area that was towards her anus has closed over. We are using Santyl covered with Hydrofera Blue. We have not been able to get her lab work apparently her veins are very difficult for phlebotomy. They are trying to get the blood work done where she dialyzes in [facility] but that requires some logistical issues. 2/26; not much change in the wound. Still a nonviable surface we have been using Santyl and Hydrofera Blue. I changed her to [facility] only. She tells me she has rigorously offloading this area she also complains of some pain 3/5; still no change here. I switch to pure Hydrofera Blue. She still complains of a lot of pain although is a big wound it seems out of proportion to what I might expect. This was initially a surgical wound secondary to necrotizing wound infection. She is on dialysis. Other than that she thinks she is offloading this fairly rigorously. She has not been systemically unwell 3/26; I switched to Iodoflex 3 weeks ago. This really seems to have helped, much better surface and slight improvement in the surface area. Objective Constitutional Patient is hypertensive.. Pulse regular and within target range for patient.Marland Kitchen Respirations regular, non-labored and within target range.. Temperature is normal and within the target range for the patient.Marland Kitchen Appears in no distress. Vitals Time Taken: 9:05 AM, Height: 65 in, Weight: 130 lbs, BMI: 21.6, Temperature: 98.1 F, Pulse: 100 bpm, Respiratory Rate: 18 breaths/min, Blood Pressure: 141/89 mmHg. General Notes: Wound exam; the area in question is on the lower sacrum. She has some new epithelialization coming from roughly 6-9 o'clock. This is exciting. I used an open curette there is no tactile fibrinous debris on the surface which is quite an  improvement from when she was last here. Therefore no mechanical debridement was necessary. There is no evidence of surrounding infection Integumentary (Hair, Skin) Wound #1 status is Open. Original cause of wound was Pressure Injury. The wound is located on the Sacrum. The wound measures 6.5cm length x 4cm width x  0.6cm depth; 20.42cm^2 area and 12.252cm^3 volume. There is Fat Layer (Subcutaneous Tissue) Exposed exposed. There is no tunneling or undermining noted. There is a medium amount of serosanguineous drainage noted. The wound margin is epibole. There is small (1-33%) pink granulation within the wound bed. There is a large (67-100%) amount of necrotic tissue within the wound bed including Adherent Slough. Assessment Active Problems ICD-10 Pressure ulcer of sacral region, stage 4 Pressure ulcer of sacral region, stage 2 Disruption of external operation (surgical) wound, not elsewhere classified, subsequent encounter Necrotizing fasciitis Plan Follow-up Appointments: Return Appointment in 2 weeks. - Friday Dressing Change Frequency: Wound #1 Sacrum: Change dressing every day. Skin Barriers/Peri-Wound Care: Wound #1 Sacrum: Skin Prep Wound Cleansing: Wound #1 Sacrum: Clean wound with Wound Cleanser - or normal saline Primary Wound Dressing: Wound #1 Sacrum: Iodoflex - or iodosorb gel (may use hydrofera blue until iodoflex available) Secondary Dressing: Wound #1 Sacrum: Foam Border - or ABD Off-Loading: Turn and reposition every 2 hours 1. I think the patient will continue with the Iodoflex for the next 2 weeks. If the surface wound continues to be satisfactorily we could move perhaps back to [facility] or perhaps polymen AG 2. We did order an x-ray last time because of her complaints of pain. The patient states that she was told that this was clear but I do not have a report 3. No evidence of surrounding infection 4. The patient is motivated to offload this I think  the real problem is when she goes to dialysis Electronic Signature(s) Signed: 07/28/2019 8:02:09 AM By: Linton Ham MD Entered By: Linton Ham on 07/27/2019 09:42:36 -------------------------------------------------------------------------------- SuperBill Details Patient Name: Date of Service: [name] Barnett, [name] Barnett 07/27/2019 Medical Record [id number] Patient Account Number: [id number] Date of Birth/Sex: Treating RN: 04/06/1981 (39 y.o. [name] Primary Care Provider: Julianne Handler Other Clinician: Referring Provider: Treating Provider/Extender:Clint Biello, Raymondo Band, [facility] Weeks in Treatment: 6 Diagnosis Coding ICD-10 Codes Code Description L89.154 Pressure ulcer of sacral region, stage 4 L89.152 Pressure ulcer of sacral region, stage 2 Disruption of external operation (surgical) wound, not elsewhere classified, subsequent T81.31XD encounter M72.6 Necrotizing fasciitis Facility Procedures CPT4 Code: [id number] Description: 99213 - WOUND CARE VISIT-LEV 3 EST PT Modifier: Quantity: 1 Physician Procedures CPT4: Description Modifier Quantity Code [id number] 99213 - WC PHYS LEVEL 3 - EST PT 1 ICD-10 Diagnosis Description L89.154 Pressure ulcer of sacral region, stage 4 T81.31XD Disruption of external operation (surgical) wound, not elsewhere classified,  subsequent encounter Electronic Signature(s) Signed: 07/28/2019 8:02:09 AM By: Linton Ham MD Entered By: Linton Ham on 07/27/2019 09:44:57

## 2019-08-10 ENCOUNTER — Encounter (HOSPITAL_BASED_OUTPATIENT_CLINIC_OR_DEPARTMENT_OTHER): Payer: Medicare Other | Admitting: Internal Medicine

## 2019-08-15 NOTE — Progress Notes (Signed)
[name] KIAH, VANALSTINE ([id number]) Visit Report for 07/27/2019 Arrival Information Details Patient Name: Date of Service: [name] Barnett, [name] Barnett 07/27/2019 9:00 AM Medical Record [id number] Patient Account Number: [id number] Date of Birth/Sex: Treating RN: 08/04/1980 (39 y.o. F) Primary Care Dayami Taitt: Julianne Handler Other Clinician: Referring Corinthian Mizrahi: Treating Hasini Peachey/Extender:Robson, Raymondo Band, SHARMIN Weeks in Treatment: 6 Visit Information History Since Last Visit Added or deleted any medications: No Patient Arrived: Wheel Chair Any new allergies or adverse reactions: No Arrival Time: 09:04 Had a fall or experienced change in No activities of daily living that may affect Accompanied By: caregiver risk of falls: Transfer Assistance: None Signs or symptoms of abuse/neglect since last No Patient Identification Verified: Yes visito Secondary Verification Process Completed: Yes Hospitalized since last visit: No Patient Requires Transmission-Based No Implantable device outside of the clinic excluding No Precautions: cellular tissue based products placed in the center Patient Has Alerts: No since last visit: Has Dressing in Place as Prescribed: Yes Pain Present Now: Yes Electronic Signature(s) Signed: 08/15/2019 9:21:08 AM By: Sandre Kitty Entered By: Sandre Kitty on 07/27/2019 09:05:51 -------------------------------------------------------------------------------- Clinic Level of Care Assessment Details Patient Name: Date of Service: [name] DEZIAH, RENWICK 07/27/2019 9:00 AM Medical Record [id number] Patient Account Number: [id number] Date of Birth/Sex: Treating RN: 1981-04-04 (39 y.o. [name] Primary Care Dellene Mcgroarty: Julianne Handler Other Clinician: Referring Bralyn Folkert: Treating Kawthar Ennen/Extender:Robson, Raymondo Band, SHARMIN Weeks in Treatment: 6 Clinic Level of Care Assessment Items TOOL 4 Quantity Score X  - Use when only an EandM is performed on FOLLOW-UP visit 1 0 ASSESSMENTS - Nursing Assessment / Reassessment X - Reassessment of Co-morbidities (includes updates in patient status) 1 10 X - Reassessment of Adherence to Treatment Plan 1 5 ASSESSMENTS - Wound and Skin Assessment / Reassessment X - Simple Wound Assessment / Reassessment - one wound 1 5 []  - Complex Wound Assessment / Reassessment - multiple wounds 0 []  - Dermatologic / Skin Assessment (not related to wound area) 0 ASSESSMENTS - Focused Assessment []  - Circumferential Edema Measurements - multi extremities 0 []  - Nutritional Assessment / Counseling / Intervention 0 []  - Lower Extremity Assessment (monofilament, tuning fork, pulses) 0 []  - Peripheral Arterial Disease Assessment (using hand held doppler) 0 ASSESSMENTS - Ostomy and/or Continence Assessment and Care []  - Incontinence Assessment and Management 0 []  - Ostomy Care Assessment and Management (repouching, etc.) 0 PROCESS - Coordination of Care X - Simple Patient / Family Education for ongoing care 1 15 []  - Complex (extensive) Patient / Family Education for ongoing care 0 X - Staff obtains Programmer, systems, Records, Test Results / Process Orders 1 10 X - Staff telephones HHA, Nursing Homes / Clarify orders / etc 1 10 []  - Routine Transfer to another Facility (non-emergent condition) 0 []  - Routine Hospital Admission (non-emergent condition) 0 []  - New Admissions / Biomedical engineer / Ordering NPWT, Apligraf, etc. 0 []  - Emergency Hospital Admission (emergent condition) 0 X - Simple Discharge Coordination 1 10 []  - Complex (extensive) Discharge Coordination 0 PROCESS - Special Needs []  - Pediatric / Minor Patient Management 0 []  - Isolation Patient Management 0 []  - Hearing / Language / Visual special needs 0 []  - Assessment of Community assistance (transportation, D/C planning, etc.) 0 []  - Additional assistance / Altered mentation 0 []  - Support Surface(s)  Assessment (bed, cushion, seat, etc.) 0 INTERVENTIONS - Wound Cleansing / Measurement X - Simple Wound Cleansing - one wound 1 5 []  - Complex Wound Cleansing - multiple wounds 0 X - Wound Imaging (photographs -  any number of wounds) 1 5 []  - Wound Tracing (instead of photographs) 0 X - Simple Wound Measurement - one wound 1 5 []  - Complex Wound Measurement - multiple wounds 0 INTERVENTIONS - Wound Dressings X - Small Wound Dressing one or multiple wounds 1 10 []  - Medium Wound Dressing one or multiple wounds 0 []  - Large Wound Dressing one or multiple wounds 0 X - Application of Medications - topical 1 5 []  - Application of Medications - injection 0 INTERVENTIONS - Miscellaneous []  - External ear exam 0 []  - Specimen Collection (cultures, biopsies, blood, body fluids, etc.) 0 []  - Specimen(s) / Culture(s) sent or taken to Lab for analysis 0 []  - Patient Transfer (multiple staff / Civil Service fast streamer / Similar devices) 0 []  - Simple Staple / Suture removal (25 or less) 0 []  - Complex Staple / Suture removal (26 or more) 0 []  - Hypo / Hyperglycemic Management (close monitor of Blood Glucose) 0 []  - Ankle / Brachial Index (ABI) - do not check if billed separately 0 X - Vital Signs 1 5 Has the patient been seen at the hospital within the last three years: Yes Total Score: 100 Level Of Care: New/Established - Level 3 Electronic Signature(s) Signed: 07/27/2019 5:41:24 PM By: Kela Millin Entered By: Kela Millin on 07/27/2019 09:29:20 -------------------------------------------------------------------------------- Encounter Discharge Information Details Patient Name: Date of Service: [name] DANIELS, Ayanah 07/27/2019 9:00 AM Medical Record [id number] Patient Account Number: [id number] Date of Birth/Sex: Treating RN: 1980/12/14 (39 y.o. Debby Bud Primary Care Johnika Escareno: Julianne Handler Other Clinician: Referring Guerin Lashomb: Treating Lyliana Dicenso/Extender:Robson,  Raymondo Band, [facility] Weeks in Treatment: 6 Encounter Discharge Information Items Discharge Condition: Stable Ambulatory Status: Wheelchair Discharge Destination: Skilled Nursing Facility Telephoned: No Orders Sent: Yes Transportation: Private Auto Accompanied By: caregiver Schedule Follow-up Yes Appointment: Clinical Summary of Care: Electronic Signature(s) Signed: 07/27/2019 5:42:35 PM By: Deon Pilling Entered By: Deon Pilling on 07/27/2019 10:09:18 -------------------------------------------------------------------------------- Lower Extremity Assessment Details Patient Name: Date of Service: [name] YARIANA, HOAGLUND 07/27/2019 9:00 AM Medical Record [id number] Patient Account Number: [id number] Date of Birth/Sex: Treating RN: 09/01/1980 (39 y.o. F) Primary Care Decklyn Hornik: Julianne Handler Other Clinician: Referring Caid Radin: Treating Petrice Beedy/Extender:Robson, Raymondo Band, [facility] Weeks in Treatment: 6 Electronic Signature(s) Signed: 08/15/2019 9:21:08 AM By: Sandre Kitty Entered By: Sandre Kitty on 07/27/2019 09:06:36 -------------------------------------------------------------------------------- Multi-Disciplinary Care Plan Details Patient Name: Date of Service: [name] DANIELS, [place] 07/27/2019 9:00 AM Medical Record [id number] Patient Account Number: [id number] Date of Birth/Sex: Treating RN: [birth year] (39 y.o. [name] Primary Care Jsiah Menta: Julianne Handler Other Clinician: Referring Kairav Russomanno: Treating Miriam Liles/Extender:Robson, Raymondo Band, SHARMIN Weeks in Treatment: 6 Active Inactive Pressure Nursing Diagnoses: Knowledge deficit related to management of pressures ulcers Goals: Patient/caregiver will verbalize understanding of pressure ulcer management Date Initiated: 06/15/2019 Target Resolution Date: 08/17/2019 Goal Status: Active Interventions: Provide education on pressure  ulcers Notes: Wound/Skin Impairment Nursing Diagnoses: Impaired tissue integrity Goals: Ulcer/skin breakdown will have a volume reduction of 50% by week 8 Date Initiated: 06/15/2019 Target Resolution Date: 08/03/2019 Goal Status: Active Interventions: Provide education on ulcer and skin care Notes: Electronic Signature(s) Signed: 07/27/2019 5:41:24 PM By: Kela Millin Entered By: Kela Millin on 07/27/2019 08:57:11 -------------------------------------------------------------------------------- Pain Assessment Details Patient Name: Date of Service: [name] PORSCHE, NOGUCHI 07/27/2019 9:00 AM Medical Record [id number] Patient Account Number: [id number] Date of Birth/Sex: Treating RN: 08-03-1980 (39 y.o. F) Primary Care Presli Fanguy: Julianne Handler Other Clinician: Referring Bethani Brugger: Treating Ragena Fiola/Extender:Robson, Raymondo Band, SHARMIN Weeks in Treatment: 6 Active Problems Location of Pain Severity and Description of  Pain Patient Has Paino Yes Site Locations Rate the pain. Current Pain Level: 6 Pain Management and Medication Current Pain Management: Electronic Signature(s) Signed: 08/15/2019 9:21:08 AM By: Sandre Kitty Entered By: Sandre Kitty on 07/27/2019 09:06:27 -------------------------------------------------------------------------------- Patient/Caregiver Education Details Patient Name: Garlan Fair, Rossetta 3/26/2021andnbsp9:00 Date of Service: AM Medical Record [id number] Number: Patient Account Number: [id number] Date of Birth/Gender: July 08, 1980 (38 y.o. F) Treating RN: Kela Millin Primary Care Other Clinician: Jenna Luo, Physician: Mariane Baumgarten Physician/ExtenderJenna Luo, Referring Physician: Ubaldo Glassing in Treatment: 6 Education Assessment Education Provided To: Patient Education Topics Provided Pressure: Methods: Explain/Verbal Responses: State content correctly Wound/Skin  Impairment: Methods: Explain/Verbal Responses: State content correctly Electronic Signature(s) Signed: 07/27/2019 5:41:24 PM By: Kela Millin Entered By: Kela Millin on 07/27/2019 08:57:57 -------------------------------------------------------------------------------- Wound Assessment Details Patient Name: Date of Service: [name] MEGAN, PRESTI 07/27/2019 9:00 AM Medical Record [id number] Patient Account Number: [id number] Date of Birth/Sex: Treating RN: 23-Apr-1981 (39 y.o. F) Primary Care Emmitt Matthews: Julianne Handler Other Clinician: Referring Keisean Skowron: Treating Laqueena Hinchey/Extender:Robson, Raymondo Band, SHARMIN Weeks in Treatment: 6 Wound Status Wound Number: 1 Primary Pressure Ulcer Etiology: Wound Location: Sacrum Secondary Necrotizing Infection Wounding Event: Pressure Injury Etiology: Date Acquired: 01/11/2019 Wound Open Weeks Of Treatment: 6 Status: Clustered Wound: No Comorbid Cataracts, Glaucoma, Anemia, Congestive History: Heart Failure, Coronary Artery Disease, Hypertension, Type II Diabetes, End Stage Renal Disease, Neuropathy, Confinement Anxiety Photos Photo Uploaded By: Mikeal Hawthorne on 08/03/2019 16:32:10 Wound Measurements Length: (cm) 6.5 % Reduction Width: (cm) 4 % Reduction Depth: (cm) 0.6 Epithelializ Area: (cm) 20.42 Tunneling: Volume: (cm) 12.252 Undermining Wound Description Classification: Category/Stage IV Wound Margin: Epibole Exudate Amount: Medium Exudate Type: Serosanguineous Exudate Color: red, brown Wound Bed Granulation Amount: Small (1-33%) Granulation Quality: Pink Necrotic Amount: Large (67-100%) Necrotic Quality: Adherent Slough Foul Odor After Cleansing: No Slough/Fibrino Yes Exposed Structure Fascia Exposed: No Fat Layer (Subcutaneous Tissue) Exposed: Yes Tendon Exposed: No Muscle Exposed: No Joint Exposed: No Bone Exposed: No in Area: 45.8% in Volume: 59.4% ation: Small (1-33%) No :  No Treatment Notes Wound #1 (Sacrum) 1. Cleanse With Wound Cleanser 2. Periwound Care Skin Prep 3. Primary Dressing Applied Iodoflex 4. Secondary Dressing Foam Border Dressing 5. Secured With Office manager) Signed: 07/27/2019 5:41:24 PM By: Kela Millin Entered By: Kela Millin on 07/27/2019 09:17:19 -------------------------------------------------------------------------------- Vitals Details Patient Name: Date of Service: [name] DANIELS, [place] 07/27/2019 9:00 AM Medical Record [id number] Patient Account Number: [id number] Date of Birth/Sex: Treating RN: 10-Jun-1980 (39 y.o. F) Primary Care Lakeena Downie: Julianne Handler Other Clinician: Referring Melenda Bielak: Treating Seniya Stoffers/Extender:Robson, Raymondo Band, SHARMIN Weeks in Treatment: 6 Vital Signs Time Taken: 09:05 Temperature (F): 98.1 Height (in): 65 Pulse (bpm): 100 Weight (lbs): 130 Respiratory Rate (breaths/min): 18 Body Mass Index (BMI): 21.6 Blood Pressure (mmHg): 141/89 Reference Range: 80 - 120 mg / dl Electronic Signature(s) Signed: 08/15/2019 9:21:08 AM By: Sandre Kitty Entered By: Sandre Kitty on 07/27/2019 09:06:18

## 2019-08-15 NOTE — Progress Notes (Signed)
[name] Barnett, EMMONS ([id number]) Visit Report for 06/22/2019 Arrival Information Details Patient Name: Date of Service: [name] Barnett, [name] Barnett 06/22/2019 9:15 AM Medical Record [id number] Patient Account Number: [id number] Date of Birth/Sex: Treating RN: November 10, 1980 (39 y.o. [name] Primary Care Deejay Koppelman: Julianne Handler Other Clinician: Referring Wray Goehring: Treating Kalob Bergen/Extender:Robson, Raymondo Band, SHARMIN Weeks in Treatment: 1 Visit Information History Since Last Visit Added or deleted any medications: No Patient Arrived: Wheel Chair Any new allergies or adverse reactions: No Arrival Time: 09:48 Had a fall or experienced change in No activities of daily living that may affect Accompanied By: caregiver risk of falls: Transfer Assistance: Manual Signs or symptoms of abuse/neglect since last No Patient Identification Verified: Yes visito Secondary Verification Process Completed: Yes Hospitalized since last visit: No Patient Requires Transmission-Based No Implantable device outside of the clinic excluding No Precautions: cellular tissue based products placed in the center Patient Has Alerts: No since last visit: Has Dressing in Place as Prescribed: Yes Pain Present Now: Yes Electronic Signature(s) Signed: 06/28/2019 8:56:01 AM By: Levan Hurst RN, BSN Entered By: Levan Hurst on 06/22/2019 10:50:58 -------------------------------------------------------------------------------- Encounter Discharge Information Details Patient Name: Date of Service: [name] Barnett, [name] 06/22/2019 9:15 AM Medical Record [id number] Patient Account Number: [id number] Date of Birth/Sex: Treating RN: July 19, 1980 (39 y.o. [name] Primary Care Armanii Urbanik: Julianne Handler Other Clinician: Referring Bunny Lowdermilk: Treating Lashon Hillier/Extender:Robson, Raymondo Band, SHARMIN Weeks in Treatment: 1 Encounter Discharge Information Items Post  Procedure Vitals Discharge Condition: Stable Temperature (F): 98.4 Ambulatory Status: Wheelchair Pulse (bpm): 108 Discharge Destination: Home Respiratory Rate (breaths/min): 18 Transportation: Private Auto Blood Pressure (mmHg): 176/97 Accompanied By: caregiver Schedule Follow-up Appointment: Yes Clinical Summary of Care: Patient Declined Electronic Signature(s) Signed: 06/28/2019 8:56:01 AM By: Levan Hurst RN, BSN Entered By: Levan Hurst on 06/22/2019 10:50:41 -------------------------------------------------------------------------------- Multi Wound Chart Details Patient Name: Date of Service: [name] Barnett, [name] 06/22/2019 9:15 AM Medical Record [id number] Patient Account Number: [id number] Date of Birth/Sex: Treating RN: 19-Sep-1980 (39 y.o. [name] Primary Care Abelina Ketron: Julianne Handler Other Clinician: Referring Prerana Strayer: Treating Shalay Carder/Extender:Robson, Raymondo Band, SHARMIN Weeks in Treatment: 1 Vital Signs Height(in): 65 Capillary Blood 210 Glucose(mg/dl): Weight(lbs): 130 Pulse(bpm): 108 Body Mass Index(BMI): 22 Blood Pressure(mmHg): 176/97 Temperature(F): 98.4 Respiratory 18 Rate(breaths/min): Photos: [1:No Photos] [2:No Photos] [N/A:N/A] Wound Location: [1:Sacrum] [2:Gluteal fold] [N/A:N/A] Wounding Event: [1:Pressure Injury] [2:Pressure Injury] [N/A:N/A] Primary Etiology: [1:Pressure Ulcer] [2:Pressure Ulcer] [N/A:N/A] Secondary Etiology: [1:Necrotizing Infection] [2:N/A] [N/A:N/A] Comorbid History: [1:Cataracts, Glaucoma, Anemia, Congestive Heart Anemia, Congestive Heart Failure, Coronary Artery Disease, Hypertension, Type II Diabetes, End Stage Type II Diabetes, End Stage Renal Disease, Neuropathy, Renal Disease, Neuropathy,  Confinement Anxiety] [2:Cataracts, Glaucoma, Failure, Coronary Artery Disease, Hypertension, Confinement Anxiety] [N/A:N/A] Date Acquired: [1:01/11/2019] [2:01/11/2019] [N/A:N/A] Weeks of  Treatment: [1:1] [2:1] [N/A:N/A] Wound Status: [1:Open] [2:Healed - Epithelialized] [N/A:N/A] Measurements L x W x D 7.5x5.5x1.1 [2:0x0x0] [N/A:N/A] (cm) Area (cm) : [1:32.398] [2:0] [N/A:N/A] Volume (cm) : [1:35.637] [2:0] [N/A:N/A] % Reduction in Area: [1:14.10%] [2:100.00%] [N/A:N/A] % Reduction in Volume: -18.20% [2:100.00%] [N/A:N/A] Classification: [1:Category/Stage IV] [2:Category/Stage II] [N/A:N/A] Exudate Amount: [1:Medium] [2:None Present] [N/A:N/A] Exudate Type: [1:Serosanguineous] [2:N/A] [N/A:N/A] Exudate Color: [1:red, brown] [2:N/A] [N/A:N/A] Wound Margin: [1:Epibole] [2:Flat and Intact] [N/A:N/A] Granulation Amount: [1:Medium (34-66%)] [2:None Present (0%)] [N/A:N/A] Granulation Quality: [1:Pink] [2:N/A] [N/A:N/A] Necrotic Amount: [1:Medium (34-66%)] [2:None Present (0%)] [N/A:N/A] Exposed Structures: [1:Fat Layer (Subcutaneous Tissue) Exposed: Yes Fascia: No Tendon: No Muscle: No Joint: No Bone: No] [2:Fascia: No Fat Layer (Subcutaneous Tissue) Exposed: No Tendon: No Muscle: No Joint: No Bone: No] [N/A:N/A] Epithelialization: [1:Small (  1-33%)] [2:Large (67-100%)] [N/A:N/A] Debridement: [1:Debridement - Excisional] [2:N/A] [N/A:N/A] Pre-procedure [1:10:20] [2:N/A] [N/A:N/A] Verification/Time Out Taken: Pain Control: [1:Other] [2:N/A] [N/A:N/A] Tissue Debrided: [1:Subcutaneous] [2:N/A] [N/A:N/A] Level: [1:Skin/Subcutaneous Tissue] [2:N/A] [N/A:N/A] Debridement Area (sq cm):41.25 [2:N/A] [N/A:N/A] Instrument: [1:Curette] [2:N/A] [N/A:N/A] Bleeding: [1:Minimum] [2:N/A] [N/A:N/A] Hemostasis Achieved: [1:Pressure] [2:N/A] [N/A:N/A] Procedural Pain: [1:2] [2:N/A] [N/A:N/A] Post Procedural Pain: [1:0] [2:N/A] [N/A:N/A] Debridement Treatment Procedure was tolerated [2:N/A] [N/A:N/A] Response: [1:well] Post Debridement [1:7.5x5.5x1.1] [2:N/A] [N/A:N/A] Measurements L x W x D (cm) Post Debridement [1:35.637] [2:N/A] [N/A:N/A] Volume: (cm) Post Debridement Stage:  Category/Stage IV [2:N/A N/A] [N/A:N/A N/A] Treatment Notes Wound #1 (Sacrum) 1. Cleanse With Wound Cleanser 3. Primary Dressing Applied Santyl Hydrofera Blue 4. Secondary Dressing Foam Border Dressing Notes Santyl under Hydrofera Blue Electronic Signature(s) Signed: 06/22/2019 6:29:59 PM By: Linton Ham MD Signed: 06/25/2019 1:40:10 PM By: Kela Millin Entered By: Linton Ham on 06/22/2019 10:54:14 -------------------------------------------------------------------------------- Multi-Disciplinary Care Plan Details Patient Name: Date of Service: [name] ELIDE, STALZER 06/22/2019 9:15 AM Medical Record [id number] Patient Account Number: [id number] Date of Birth/Sex: Treating RN: 1980/09/08 (39 y.o. [name] Primary Care Milbern Doescher: Julianne Handler Other Clinician: Referring Latrece Nitta: Treating Manahil Vanzile/Extender:Robson, Raymondo Band, SHARMIN Weeks in Treatment: 1 Active Inactive Pressure Nursing Diagnoses: Knowledge deficit related to management of pressures ulcers Goals: Patient/caregiver will verbalize understanding of pressure ulcer management Date Initiated: 06/15/2019 Target Resolution Date: 07/13/2019 Goal Status: Active Interventions: Provide education on pressure ulcers Notes: Wound/Skin Impairment Nursing Diagnoses: Impaired tissue integrity Goals: Ulcer/skin breakdown will have a volume reduction of 50% by week 8 Date Initiated: 06/15/2019 Target Resolution Date: 07/27/2019 Goal Status: Active Interventions: Provide education on ulcer and skin care Notes: Electronic Signature(s) Signed: 06/25/2019 1:40:10 PM By: Kela Millin Entered By: Kela Millin on 06/22/2019 09:42:35 -------------------------------------------------------------------------------- Pain Assessment Details Patient Name: Date of Service: RHONDALYN, CLINGAN 06/22/2019 9:15 AM Medical Record [id number] Patient Account Number:  [id number] Date of Birth/Sex: Treating RN: 04/21/1981 (39 y.o. [name] Primary Care Whittaker Lenis: Julianne Handler Other Clinician: Referring Sabriya Yono: Treating Yulisa Chirico/Extender:Robson, Raymondo Band, [facility] Weeks in Treatment: 1 Active Problems Location of Pain Severity and Description of Pain Patient Has Paino Yes Site Locations Rate the pain. Current Pain Level: 7 Pain Management and Medication Current Pain Management: Electronic Signature(s) Signed: 06/25/2019 1:40:10 PM By: Kela Millin Signed: 08/15/2019 9:23:26 AM By: Sandre Kitty Entered By: Sandre Kitty on 06/22/2019 09:50:09 -------------------------------------------------------------------------------- Patient/Caregiver Education Details Patient Name: Garlan Fair, Azuri 2/19/2021andnbsp9:15 Date of Service: AM Medical Record [id number] Number: Patient Account Number: [id number] Date of Birth/Gender: 1980/10/28 (39 y.o. F) Treating RN: Kela Millin Other ClinicianJenna Luo, Primary Care Physician: Mariane Baumgarten Physician/ExtenderJenna Luo, Referring Physician: Ubaldo Glassing in Treatment: 1 Education Assessment Education Provided To: Patient Education Topics Provided Pressure: Methods: Explain/Verbal Responses: State content correctly Wound/Skin Impairment: Methods: Explain/Verbal Responses: State content correctly Electronic Signature(s) Signed: 06/25/2019 1:40:10 PM By: Kela Millin Entered By: Kela Millin on 06/22/2019 09:42:57 -------------------------------------------------------------------------------- Wound Assessment Details Patient Name: Date of Service: MAKENZYE, TROUTMAN 06/22/2019 9:15 AM Medical Record [id number] Patient Account Number: [id number] Date of Birth/Sex: Treating RN: 1981/02/02 (39 y.o. [name] Primary Care Lindley Stachnik: Julianne Handler Other Clinician: Referring  Denea Cheaney: Treating Sharran Caratachea/Extender:Robson, Raymondo Band, SHARMIN Weeks in Treatment: 1 Wound Status Wound Number: 1 Primary Pressure Ulcer Etiology: Wound Location: Sacrum Secondary Necrotizing Infection Wounding Event: Pressure Injury Etiology: Date Acquired: 01/11/2019 Wound Open Weeks Of Treatment: 1 Status: Clustered Wound: No Comorbid Cataracts, Glaucoma, Anemia, Congestive History: Heart Failure, Coronary Artery Disease, Hypertension, Type II Diabetes, End Stage Renal Disease, Neuropathy,  Confinement Anxiety Photos Wound Measurements Length: (cm) 7.5 Width: (cm) 5.5 Depth: (cm) 1.1 Area: (cm) 32.398 Volume: (cm) 35.637 Wound Description Classification: Category/Stage IV Wound Margin: Epibole Exudate Amount: Medium Exudate Type: Serosanguineous Exudate Color: red, brown Wound Bed Granulation Amount: Medium (34-66%) Granulation Quality: Pink Necrotic Amount: Medium (34-66%) Necrotic Quality: Adherent Slough After Cleansing: No rino Yes Exposed Structure sed: No Subcutaneous Tissue) Exposed: Yes sed: No sed: No ed: No d: No % Reduction in Area: 14.1% % Reduction in Volume: -18.2% Epithelialization: Small (1-33%) Tunneling: No Undermining: No Foul Odor Slough/Fib Fascia Expo Fat Layer ( Tendon Expo Muscle Expo Joint Expos Bone Expose Electronic Signature(s) Signed: 06/22/2019 5:06:31 PM By: Mikeal Hawthorne EMT/HBOT Signed: 06/25/2019 1:40:10 PM By: Kela Millin Entered By: Mikeal Hawthorne on 06/22/2019 15:44:36 -------------------------------------------------------------------------------- Wound Assessment Details Patient Name: Date of Service: [name] KABRIA, HETZER 06/22/2019 9:15 AM Medical Record [id number] Patient Account Number: [id number] Date of Birth/Sex: Treating RN: 1980-11-02 (39 y.o. [name] Primary Care Lachrista Heslin: Julianne Handler Other Clinician: Referring Tayra Dawe: Treating  Mozel Burdett/Extender:Robson, Raymondo Band, SHARMIN Weeks in Treatment: 1 Wound Status Wound Number: 2 Primary Pressure Ulcer Etiology: Wound Location: Gluteal fold Wound Healed - Epithelialized Wounding Event: Pressure Injury Status: Date Acquired: 01/11/2019 Comorbid Cataracts, Glaucoma, Anemia, Congestive Weeks Of Treatment: 1 History: Heart Failure, Coronary Artery Disease, Clustered Wound: No Hypertension, Type II Diabetes, End Stage Renal Disease, Neuropathy, Confinement Anxiety Photos Wound Measurements Length: (cm) 0 % Reduction Width: (cm) 0 % Reduction Depth: (cm) 0 Epitheliali Area: (cm) 0 Tunneling: Volume: (cm) 0 Underminin Wound Description Classification: Category/Stage II Foul Odor Wound Margin: Flat and Intact Slough/Fib Exudate Amount: None Present Wound Bed Granulation Amount: None Present (0%) Necrotic Amount: None Present (0%) Fascia Expo Fat Layer ( Tendon Expo Muscle Expo Joint Expos Bone Expose After Cleansing: No rino No Exposed Structure sed: No Subcutaneous Tissue) Exposed: No sed: No sed: No ed: No d: No in Area: 100% in Volume: 100% zation: Large (67-100%) No g: No Electronic Signature(s) Signed: 06/22/2019 5:06:31 PM By: Mikeal Hawthorne EMT/HBOT Signed: 06/25/2019 1:40:10 PM By: Kela Millin Entered By: Mikeal Hawthorne on 06/22/2019 15:41:47 -------------------------------------------------------------------------------- Vitals Details Patient Name: Date of Service: [name] Barnett, Reene 06/22/2019 9:15 AM Medical Record [id number] Patient Account Number: [id number] Date of Birth/Sex: Treating RN: 1980/12/18 (39 y.o. [name] Primary Care Helaina Stefano: Julianne Handler Other Clinician: Referring Derek Huneycutt: Treating Areesha Dehaven/Extender:Robson, Raymondo Band, SHARMIN Weeks in Treatment: 1 Vital Signs Time Taken: 09:49 Temperature (F): 98.4 Height (in): 65 Pulse (bpm): 108 Weight (lbs):  130 Respiratory Rate (breaths/min): 18 Body Mass Index (BMI): 21.6 Blood Pressure (mmHg): 176/97 Capillary Blood Glucose (mg/dl): 210 Reference Range: 80 - 120 mg / dl Electronic Signature(s) Signed: 06/28/2019 8:56:01 AM By: Levan Hurst RN, BSN Entered By: Levan Hurst on 06/22/2019 10:49:55

## 2019-08-27 ENCOUNTER — Other Ambulatory Visit: Payer: Self-pay

## 2019-08-27 ENCOUNTER — Encounter (HOSPITAL_BASED_OUTPATIENT_CLINIC_OR_DEPARTMENT_OTHER): Payer: Medicare Other | Attending: Internal Medicine | Admitting: Internal Medicine

## 2019-08-27 DIAGNOSIS — I132 Hypertensive heart and chronic kidney disease with heart failure and with stage 5 chronic kidney disease, or end stage renal disease: Secondary | ICD-10-CM | POA: Diagnosis not present

## 2019-08-27 DIAGNOSIS — N186 End stage renal disease: Secondary | ICD-10-CM | POA: Insufficient documentation

## 2019-08-27 DIAGNOSIS — E1122 Type 2 diabetes mellitus with diabetic chronic kidney disease: Secondary | ICD-10-CM | POA: Diagnosis not present

## 2019-08-27 DIAGNOSIS — I5032 Chronic diastolic (congestive) heart failure: Secondary | ICD-10-CM | POA: Insufficient documentation

## 2019-08-27 DIAGNOSIS — Z992 Dependence on renal dialysis: Secondary | ICD-10-CM | POA: Diagnosis not present

## 2019-08-27 DIAGNOSIS — L89154 Pressure ulcer of sacral region, stage 4: Secondary | ICD-10-CM | POA: Diagnosis not present

## 2019-08-27 DIAGNOSIS — I69354 Hemiplegia and hemiparesis following cerebral infarction affecting left non-dominant side: Secondary | ICD-10-CM | POA: Diagnosis not present

## 2019-08-27 NOTE — Progress Notes (Signed)
[name] Barnett, GARVER ([id number]) Visit Report for 08/27/2019 HPI Details Patient Name: Date of Service: [name] Barnett, MARKWOOD 08/27/2019 12:30 PM Medical Record [id number] Patient Account Number: [id number] Date of Birth/Sex: Treating RN: [birth year] (39 y.o. [name] Primary Care Provider: Julianne Handler Other Clinician: Referring Provider: Treating Provider/Extender:Soriyah Osberg, Raymondo Band, [facility] Weeks in Treatment: 10 History of Present Illness HPI Description: Admission 06/15/2019 This is a 39 year old woman who comes to [place] from [facility] skilled facility in [facility]. I am able to follow a lot of her history on care everywhere in epic. The patient is a type II diabetic and has been on dialysis. 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 I am not exactly sure of the nature of the stroke at this present time. She has made a decent recovery and is now up walking with a walker. In August 2020 she was admitted to [facility] in [place] with L4-L5 discitis. Culture and sensitivity showed Klebsiella. She was discharged on 8 weeks of ceftazidime. In mid September she is readmitted to the hospital with a pressure ulcer on her lower sacrum and necrotizing fasciitis. She required an extensive surgical debridement. CT scan of the pelvis at 1 point showed communication with the rectum. A colostomy was recommended and placed. Culture of this area showed Klebsiella and yeast. She was discharged on an extended course of meropenem and Diflucan. [facility] [place] We do not have any of these records. I think she was discharged to another nursing home but is come to [facility] skilled facility in [facility]. She was sent down here for review of the remaining wound. By review of the pictures that the patient had from September  this is made considerable improvement. She tells me that the wound VAC was discontinued at the end of December 2020. Currently the facility is using a form of silver alginate to the wound surface. Past medical history type 2 diabetes with chronic renal failure on dialysis Tuesday Thursday and Saturday. CVA with left hemiparesis in June 2020 but she appears to be making a good recovery. Chronic diastolic heart failure, hypertension 2/19; patient's wound area slightly smaller. The area that was towards her anus has closed over. We are using Santyl covered with Hydrofera Blue. We have not been able to get her lab work apparently her veins are very difficult for phlebotomy. They are trying to get the blood work done where she dialyzes in [facility] but that requires some logistical issues. 2/26; not much change in the wound. Still a nonviable surface we have been using Santyl and Hydrofera Blue. I changed her to [facility] only. She tells me she has rigorously offloading this area she also complains of some pain 3/5; still no change here. I switch to pure Hydrofera Blue. She still complains of a lot of pain although is a big wound it seems out of proportion to what I might expect. This was initially a surgical wound secondary to necrotizing wound infection. She is on dialysis. Other than that she thinks she is offloading this fairly rigorously. She has not been systemically unwell 3/26; I switched to Iodoflex 3 weeks ago. This really seems to have helped, much better surface and slight improvement in the surface area. 4/26; the patient is still on Iodoflex she missed an appointment 2 weeks ago. Doing slightly smaller. She is on dialysis Electronic Signature(s) Signed: 08/27/2019  5:40:04 PM By: Linton Ham MD Entered By: Linton Ham on 08/27/2019 12:57:14 -------------------------------------------------------------------------------- Physical Exam Details Patient Name: Date  of Service: [name] Barnett, TOWLE 08/27/2019 12:30 PM Medical Record [id number] Patient Account Number: [id number] Date of Birth/Sex: Treating RN: 1980-05-09 (39 y.o. [name] Primary Care Provider: Julianne Handler Other Clinician: Referring Provider: Treating Provider/Extender:Abdi Husak, Raymondo Band, [facility] Weeks in Treatment: 10 Constitutional Patient is hypertensive.. Pulse regular and within target range for patient.Marland Kitchen Respirations regular, non-labored and within target range.. Temperature is normal and within the target range for the patient.Marland Kitchen Appears in no distress. Respiratory work of breathing is normal. Psychiatric appears at normal baseline. Notes Wound exam; hearing questions on the lower sacrum. She has some macerated scar tissue around the wound circumference. Inferiorly this separates the wound from small satellite area. There is no evidence of surrounding infection. Electronic Signature(s) Signed: 08/27/2019 5:40:04 PM By: Linton Ham MD Entered By: Linton Ham on 08/27/2019 12:58:18 -------------------------------------------------------------------------------- Physician Orders Details Patient Name: Date of Service: [name] Barnett, SLABACH 08/27/2019 12:30 PM Medical Record [id number] Patient Account Number: [id number] Date of Birth/Sex: Treating RN: April 08, 1981 (39 y.o. [name] Primary Care Provider: Julianne Handler Other Clinician: Referring Provider: Treating Provider/Extender:Deondre Marinaro, Raymondo Band, [facility] Weeks in Treatment: 10 Verbal / Phone Orders: No Diagnosis Coding ICD-10 Coding Code Description L89.154 Pressure ulcer of sacral region, stage 4 L89.152 Pressure ulcer of sacral region, stage 2 T81.31XD Disruption of external operation (surgical) wound, not elsewhere classified, subsequent encounter M72.6 Necrotizing fasciitis Follow-up Appointments Return Appointment in 2 weeks. Dressing  Change Frequency Wound #1 Sacrum Change dressing every day. Skin Barriers/Peri-Wound Care Wound #1 Sacrum Skin Prep Wound Cleansing Wound #1 Sacrum Clean wound with Wound Cleanser - or normal saline Primary Wound Dressing Wound #1 Sacrum Hydrofera Blue Secondary Dressing Wound #1 Sacrum Foam Border - or ABD Off-Loading Turn and reposition every 2 hours Electronic Signature(s) Signed: 08/27/2019 5:33:33 PM By: Levan Hurst RN, BSN Signed: 08/27/2019 5:40:04 PM By: Linton Ham MD Entered By: Levan Hurst on 08/27/2019 12:50:33 -------------------------------------------------------------------------------- Problem List Details Patient Name: Date of Service: [name] Barnett, CHAUCA 08/27/2019 12:30 PM Medical Record [id number] Patient Account Number: [id number] Date of Birth/Sex: Treating RN: 1980/12/29 (39 y.o. [name] Primary Care Provider: Julianne Handler Other Clinician: Referring Provider: Treating Provider/Extender:Madyson Lukach, Raymondo Band, [facility] Weeks in Treatment: 10 Active Problems ICD-10 Encounter Code Description Active Date MDM Diagnosis L89.154 Pressure ulcer of sacral region, stage 4 06/15/2019 No Yes L89.152 Pressure ulcer of sacral region, stage 2 06/15/2019 No Yes T81.31XD Disruption of external operation (surgical) wound, not 06/15/2019 No Yes elsewhere classified, subsequent encounter M72.6 Necrotizing fasciitis 06/15/2019 No Yes Inactive Problems Resolved Problems Electronic Signature(s) Signed: 08/27/2019 5:40:04 PM By: Linton Ham MD Entered By: Linton Ham on 08/27/2019 12:56:37 -------------------------------------------------------------------------------- Progress Note Details Patient Name: Date of Service: [name] Barnett, [place] 08/27/2019 12:30 PM Medical Record [id number] Patient Account Number: [id number] Date of Birth/Sex: Treating RN: 28-Mar-1981 (39 y.o. [name] Primary Care Provider:  Julianne Handler Other Clinician: Referring Provider: Treating Provider/Extender:Verneta Hamidi, Raymondo Band, [place] Weeks in Treatment: 10 Subjective History of Present Illness (HPI) Admission 06/15/2019 This is a 39 year old woman who comes to [place] from [facility] skilled facility in [facility]. I am able to follow a lot of her history on care everywhere in epic. The patient is a type II diabetic and has been on dialysis. 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 I am not exactly sure of the  nature of the stroke at this present time. She has made a decent recovery and is now up walking with a walker. In August 2020 she was admitted to [facility] in [place] with L4-L5 discitis. Culture and sensitivity showed Klebsiella. She was discharged on 8 weeks of ceftazidime. In mid September she is readmitted to the hospital with a pressure ulcer on her lower sacrum and necrotizing fasciitis. She required an extensive surgical debridement. CT scan of the pelvis at 1 point showed communication with the rectum. A colostomy was recommended and placed. Culture of this area showed Klebsiella and yeast. She was discharged on an extended course of meropenem and Diflucan. [facility] [place] We do not have any of these records. I think she was discharged to another nursing home but is come to [facility] skilled facility in [facility]. She was sent down here for review of the remaining wound. By review of the pictures that the patient had from September this is made considerable improvement. She tells me that the wound VAC was discontinued at the end of December 2020. Currently the facility is using a form of silver alginate to the wound surface. Past medical history type 2 diabetes with chronic renal failure on dialysis Tuesday Thursday and Saturday. CVA  with left hemiparesis in June 2020 but she appears to be making a good recovery. Chronic diastolic heart failure, hypertension 2/19; patient's wound area slightly smaller. The area that was towards her anus has closed over. We are using Santyl covered with Hydrofera Blue. We have not been able to get her lab work apparently her veins are very difficult for phlebotomy. They are trying to get the blood work done where she dialyzes in [facility] but that requires some logistical issues. 2/26; not much change in the wound. Still a nonviable surface we have been using Santyl and Hydrofera Blue. I changed her to [facility] only. She tells me she has rigorously offloading this area she also complains of some pain 3/5; still no change here. I switch to pure Hydrofera Blue. She still complains of a lot of pain although is a big wound it seems out of proportion to what I might expect. This was initially a surgical wound secondary to necrotizing wound infection. She is on dialysis. Other than that she thinks she is offloading this fairly rigorously. She has not been systemically unwell 3/26; I switched to Iodoflex 3 weeks ago. This really seems to have helped, much better surface and slight improvement in the surface area. 4/26; the patient is still on Iodoflex she missed an appointment 2 weeks ago. Doing slightly smaller. She is on dialysis Objective Constitutional Patient is hypertensive.. Pulse regular and within target range for patient.Marland Kitchen Respirations regular, non-labored and within target range.. Temperature is normal and within the target range for the patient.Marland Kitchen Appears in no distress. Vitals Time Taken: 12:31 PM, Height: 65 in, Weight: 130 lbs, BMI: 21.6, Temperature: 97.6 F, Pulse: 86 bpm, Respiratory Rate: 18 breaths/min, Blood Pressure: 143/82 mmHg. Respiratory work of breathing is normal. Psychiatric appears at normal baseline. General Notes: Wound exam; hearing questions  on the lower sacrum. She has some macerated scar tissue around the wound circumference. Inferiorly this separates the wound from small satellite area. There is no evidence of surrounding infection. Integumentary (Hair, Skin) Wound #1 status is Open. Original cause of wound was Pressure Injury. The wound is located on the Sacrum. The wound measures 6.5cm length x  2.7cm width x 0.8cm depth; 13.784cm^2 area and 11.027cm^3 volume. There is Fat Layer (Subcutaneous Tissue) Exposed exposed. There is no tunneling or undermining noted. There is a medium amount of serosanguineous drainage noted. The wound margin is epibole. There is small (1-33%) pink granulation within the wound bed. There is a large (67-100%) amount of necrotic tissue within the wound bed including Adherent Slough. Assessment Active Problems ICD-10 Pressure ulcer of sacral region, stage 4 Pressure ulcer of sacral region, stage 2 Disruption of external operation (surgical) wound, not elsewhere classified, subsequent encounter Necrotizing fasciitis Plan Follow-up Appointments: Return Appointment in 2 weeks. Dressing Change Frequency: Wound #1 Sacrum: Change dressing every day. Skin Barriers/Peri-Wound Care: Wound #1 Sacrum: Skin Prep Wound Cleansing: Wound #1 Sacrum: Clean wound with Wound Cleanser - or normal saline Primary Wound Dressing: Wound #1 Sacrum: Hydrofera Blue Secondary Dressing: Wound #1 Sacrum: Foam Border - or ABD Off-Loading: Turn and reposition every 2 hours 1. I changed her back from Iodoflex to [facility] 2. May need to consider a debridement of the wound edge the next time she is here depending on measurements. This seems to be macerated scar tissue 3. Between dialysis, wheelchair I do not think the patient is able to offload this in the Electronic Signature(s) Signed: 08/27/2019 5:40:04 PM By: Linton Ham MD Entered By: Linton Ham on 08/27/2019  13:00:37 -------------------------------------------------------------------------------- SuperBill Details Patient Name: Date of Service: [name] LAKETHA, Barnett 08/27/2019 Medical Record [id number] Patient Account Number: [id number] Date of Birth/Sex: Treating RN: 05-07-1980 (39 y.o. [name] Primary Care Provider: Julianne Handler Other Clinician: Referring Provider: Treating Provider/Extender:Eugean Arnott, Raymondo Band, [facility] Weeks in Treatment: 10 Diagnosis Coding ICD-10 Codes Code Description L89.154 Pressure ulcer of sacral region, stage 4 L89.152 Pressure ulcer of sacral region, stage 2 T81.31XD Disruption of external operation (surgical) wound, not elsewhere classified, subsequent encounter M72.6 Necrotizing fasciitis Facility Procedures CPT4 Code: [id number] Description: 99213 - WOUND CARE VISIT-LEV 3 EST PT Modifier: Quantity: 1 Physician Procedures CPT4 CodeDescription: [id number] - WC PHYS LEVEL 3 - EST PT ICD-10 Diagnosis Description L89.154 Pressure ulcer of sacral region, stage 4 T81.31XD Disruption of external operation (surgical) wound, not else subsequent encounter Modifier: where classified, Quantity: 1 Electronic Signature(s) Signed: 08/27/2019 5:33:33 PM By: Levan Hurst RN, BSN Signed: 08/27/2019 5:40:04 PM By: Linton Ham MD Entered By: Levan Hurst on 08/27/2019 13:17:11

## 2019-08-28 NOTE — Progress Notes (Signed)
[name] Barnett, [name] Barnett ([id number]) Visit Report for 08/27/2019 Arrival Information Details Patient Name: Date of Service: [name] Barnett, [name] Barnett 08/27/2019 12:30 PM Medical Record [id number] Patient Account Number: [id number] Date of Birth/Sex: Treating RN: [birth year] (39 y.o. Female) Epps, [place] Primary Care Uriah Philipson: Julianne Handler Other Clinician: Referring Dario Yono: Treating Shontel Santee/Extender:Robson, Raymondo Band, SHARMIN Weeks in Treatment: 10 Visit Information History Since Last Visit All ordered tests and consults were completed: No Patient Arrived: Wheel Added or deleted any medications: No Chair Any new allergies or adverse reactions: No Arrival Time: 12:30 Had a fall or experienced change in No Accompanied By: self activities of daily living that may affect Transfer Assistance: None risk of falls: Patient Identification Verified: Yes Signs or symptoms of abuse/neglect since last visito No Secondary Verification Process Completed: Yes Hospitalized since last visit: No Patient Requires Transmission-Based No Implantable device outside of the clinic excluding No Precautions: cellular tissue based products placed in the center Patient Has Alerts: No since last visit: Has Dressing in Place as Prescribed: Yes Pain Present Now: No Electronic Signature(s) Signed: 08/28/2019 5:28:23 PM By: Carlene Coria RN Entered By: Carlene Coria on 08/27/2019 12:31:43 -------------------------------------------------------------------------------- Clinic Level of Care Assessment Details Patient Name: Date of Service: [name] Barnett, [name] Barnett 08/27/2019 12:30 PM Medical Record [id number] Patient Account Number: [id number] Date of Birth/Sex: Treating RN: 11/10/1980 (39 y.o. Female) Levan Hurst Primary Care Keagen Heinlen: Julianne Handler Other Clinician: Referring Fatma Rutten: Treating Kaliyah Gladman/Extender:Robson, Raymondo Band, SHARMIN Weeks in Treatment:  10 Clinic Level of Care Assessment Items TOOL 4 Quantity Score X - Use when only an EandM is performed on FOLLOW-UP visit 1 0 ASSESSMENTS - Nursing Assessment / Reassessment X - Reassessment of Co-morbidities (includes updates in patient status) 1 10 X - Reassessment of Adherence to Treatment Plan 1 5 ASSESSMENTS - Wound and Skin Assessment / Reassessment X - Simple Wound Assessment / Reassessment - one wound 1 5 [] - Complex Wound Assessment / Reassessment - multiple wounds 0 [] - Dermatologic / Skin Assessment (not related to wound area) 0 ASSESSMENTS - Focused Assessment [] - Circumferential Edema Measurements - multi extremities 0 [] - Nutritional Assessment / Counseling / Intervention 0 [] - Lower Extremity Assessment (monofilament, tuning fork, pulses) 0 [] - Peripheral Arterial Disease Assessment (using hand held doppler) 0 ASSESSMENTS - Ostomy and/or Continence Assessment and Care [] - Incontinence Assessment and Management 0 [] - Ostomy Care Assessment and Management (repouching, etc.) 0 PROCESS - Coordination of Care X - Simple Patient / Family Education for ongoing care 1 15 [] - Complex (extensive) Patient / Family Education for ongoing care 0 X - Staff obtains Programmer, systems, Records, Test Results / Process Orders 1 10 X - Staff telephones HHA, Nursing Homes / Clarify orders / etc 1 10 [] - Routine Transfer to another Facility (non-emergent condition) 0 [] - Routine Hospital Admission (non-emergent condition) 0 [] - New Admissions / Biomedical engineer / Ordering NPWT, Apligraf, etc. 0 [] - Emergency Hospital Admission (emergent condition) 0 X - Simple Discharge Coordination 1 10 [] - Complex (extensive) Discharge Coordination 0 PROCESS - Special Needs [] - Pediatric / Minor Patient Management 0 [] - Isolation Patient Management 0 [] - Hearing / Language / Visual special needs 0 [] - Assessment of Community assistance (transportation, D/C planning, etc.) 0 [] -  Additional assistance / Altered mentation 0 [] - Support Surface(s) Assessment (bed, cushion, seat, etc.) 0 INTERVENTIONS - Wound Cleansing / Measurement X - Simple Wound Cleansing - one wound 1 5 [] - Complex Wound  Cleansing - multiple wounds 0 X - Wound Imaging (photographs - any number of wounds) 1 5 [] - Wound Tracing (instead of photographs) 0 X - Simple Wound Measurement - one wound 1 5 [] - Complex Wound Measurement - multiple wounds 0 INTERVENTIONS - Wound Dressings [] - Small Wound Dressing one or multiple wounds 0 X - Medium Wound Dressing one or multiple wounds 1 15 [] - Large Wound Dressing one or multiple wounds 0 [] - Application of Medications - topical 0 [] - Application of Medications - injection 0 INTERVENTIONS - Miscellaneous [] - External ear exam 0 [] - Specimen Collection (cultures, biopsies, blood, body fluids, etc.) 0 [] - Specimen(s) / Culture(s) sent or taken to Lab for analysis 0 [] - Patient Transfer (multiple staff / Hoyer Lift / Similar devices) 0 [] - Simple Staple / Suture removal (25 or less) 0 [] - Complex Staple / Suture removal (26 or more) 0 [] - Hypo / Hyperglycemic Management (close monitor of Blood Glucose) 0 [] - Ankle / Brachial Index (ABI) - do not check if billed separately 0 X - Vital Signs 1 5 Has the patient been seen at the hospital within the last three years: Yes Total Score: 100 Level Of Care: New/Established - Level 3 Electronic Signature(s) Signed: 08/27/2019 5:33:33 PM By: Lynch, Shatara RN, BSN Entered By: Lynch, Shatara on 08/27/2019 13:17:00 -------------------------------------------------------------------------------- Multi Wound Chart Details Patient Name: Date of Service: [name] Barnett, [name] Barnett 08/27/2019 12:30 PM Medical Record Number:[id number] Patient Account Number: [id number] Date of Birth/Sex: Treating RN: 01/07/1981 (38 y.o. 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] [N/A:N/A] Comorbid History: [1:Cataracts, Glaucoma, Anemia, Congestive Heart Failure, Coronary Artery Disease, Hypertension, Type II Diabetes, End Stage Renal Disease, Neuropathy, Confinement Anxiety] [N/A:N/A] Date Acquired: [1:01/11/2019] [N/A:N/A] Weeks of Treatment: [1:10] [N/A:N/A] Wound Status: [1:Open] [N/A:N/A] Measurements L x W x D 6.5x2.7x0.8 [N/A:N/A] (cm) Area (cm) : [1:13.784] [N/A:N/A] Volume (cm) : [1:11.027] [N/A:N/A] % Reduction in Area: [1:63.40%] [N/A:N/A] % Reduction in Volume: 63.40% [N/A:N/A] Classification: [1:Category/Stage IV] [N/A:N/A] Exudate Amount: [1:Medium] [N/A:N/A] Exudate Type: [1:Serosanguineous] [N/A:N/A] Exudate Color: [1:red, brown] [N/A:N/A] Wound Margin: [1:Epibole] [N/A:N/A] Granulation Amount: [1:Small (1-33%)] [N/A:N/A] Granulation Quality: [1:Pink] [N/A:N/A] Necrotic Amount: [1:Large (67-100%)] [N/A:N/A] Exposed Structures: [1:Fat Layer (Subcutaneous N/A Tissue) Exposed: Yes Fascia: No Tendon: No Muscle: No Joint: No Bone: No] Epithelialization: [1:Small (1-33%)] [N/A:N/A] Electronic Signature(s) Signed: 08/27/2019 5:33:33 PM By: Lynch, Shatara RN, BSN Signed: 08/27/2019 5:40:04 PM By: Robson, Michael MD Entered By: Robson, Michael on 08/27/2019 12:56:43 -------------------------------------------------------------------------------- Multi-Disciplinary Care Plan Details Patient Name: Date of Service: [name] Barnett, [name] Barnett 08/27/2019 12:30  PM Medical Record Number:[id number] Patient Account Number: [id number] Date of Birth/Sex: Treating RN: 09/25/1980 (38 y.o. Female) Lynch, Shatara Primary Care Provider: SITAFALWALLA, SHARMIN Other Clinician: Referring Provider: Treating Provider/Extender:Robson, Michael SITAFALWALLA, SHARMIN Weeks in Treatment: 10 Active Inactive Wound/Skin Impairment Nursing Diagnoses: Impaired tissue integrity Goals: Patient/caregiver will verbalize understanding of skin care regimen Date Initiated: 08/27/2019 Target Resolution Date: 09/28/2019 Goal Status: Active Ulcer/skin breakdown will have a volume reduction of 50% by week 8 Date Initiated: 06/15/2019 Date Inactivated: 08/27/2019 Target Resolution Date: 08/03/2019 Goal Status: Met Interventions: Assess patient/caregiver ability to obtain necessary supplies Assess patient/caregiver   ability to perform ulcer/skin care regimen upon admission and as needed Provide education on ulcer and skin care Notes: Electronic Signature(s) Signed: 08/27/2019 5:33:33 PM By: Lynch, Shatara RN, BSN Entered By: Lynch, Shatara on 08/27/2019 13:16:22 -------------------------------------------------------------------------------- Pain Assessment Details Patient Name: Date of Service: [name] Barnett, [name] Barnett 08/27/2019 12:30 PM Medical Record Number:[id number] Patient Account Number: [id number] Date of Birth/Sex: Treating RN: 09/10/1980 (38 y.o. Female) Epps, Carrie Primary Care Provider: SITAFALWALLA, SHARMIN Other Clinician: Referring Provider: Treating Provider/Extender:Robson, Michael SITAFALWALLA, SHARMIN Weeks in Treatment: 10 Active Problems Location of Pain Severity and Description of Pain Patient Has Paino No Site Locations Pain Management and Medication Current Pain Management: Electronic Signature(s) Signed: 08/28/2019 5:28:23 PM By: Epps, Carrie RN Entered By: Epps, Carrie on 08/27/2019  12:32:46 -------------------------------------------------------------------------------- Patient/Caregiver Education Details Patient Name: [name] Barnett, [name] Barnett 4/26/2021andnbsp12:30 Date of Service: Medical Record PM [id number] Number: Patient Account Number: [id number] Treating RN: Date of Birth/Gender: 01/02/1981 (38 y.o. Lynch, Shatara Other Clinician: Female) Primary Care Treating SITAFALWALLA, SHARMIN Robson, Michael Physician: Physician/Extender: Referring Physician: SITAFALWALLA, SHARMIN Weeks in Treatment: 10 Education Assessment Education Provided To: Patient Education Topics Provided Pressure: Methods: Explain/Verbal Responses: State content correctly Wound/Skin Impairment: Methods: Explain/Verbal Responses: State content correctly Electronic Signature(s) Signed: 08/27/2019 5:33:33 PM By: Lynch, Shatara RN, BSN Entered By: Lynch, Shatara on 08/27/2019 12:40:43 -------------------------------------------------------------------------------- Wound Assessment Details Patient Name: Date of Service: [name] Barnett, [name] Barnett 08/27/2019 12:30 PM Medical Record Number:[id number] Patient Account Number: [id number] Date of Birth/Sex: Treating RN: 03/10/1981 (38 y.o. Female) Epps, Carrie Primary Care Provider: SITAFALWALLA, SHARMIN Other Clinician: Referring Provider: Treating Provider/Extender:Robson, Michael SITAFALWALLA, SHARMIN Weeks in Treatment: 10 Wound Status Wound Number: 1 Primary Pressure Ulcer Wound Location: Sacrum Etiology: Wounding Event: Pressure Injury SecondaryNecrotizing Infection Date Acquired: 01/11/2019 Etiology: Weeks Of Treatment:10 Wound Open Clustered Wound: No Status: Comorbid Cataracts, Glaucoma, Anemia, Congestive History: Heart Failure, Coronary Artery Disease, Hypertension, Type II Diabetes, End Stage Renal Disease, Neuropathy, Confinement Anxiety Photos Photo Uploaded By: Jones, Dedrick on 08/28/2019 14:53:51 Wound  Measurements Length: (cm) 6.5 Width: (cm) 2.7 Depth: (cm) 0.8 Area: (cm) 13.784 Volume: (cm) 11.027 Wound Description Classification: Category/Stage IV Wound Margin: Epibole Exudate Amount: Medium Exudate Type: Serosanguineous Exudate Color: red, brown Wound Bed Granulation Amount: Small (1-33%) Granulation Quality: Pink Necrotic Amount: Large (67-100%) Necrotic Quality: Adherent Slough fter Cleansing: No ino Yes Exposed Structure sed: No Subcutaneous Tissue) Exposed: Yes sed: No sed: No ed: No d: No % Reduction in Area: 63.4% % Reduction in Volume: 63.4% Epithelialization: Small (1-33%) Tunneling: No Undermining: No Foul Odor A Slough/Fibr Fascia Expo Fat Layer ( Tendon Expo Muscle Expo Joint Expos Bone Expose Electronic Signature(s) Signed: 08/28/2019 5:28:23 PM By: Epps, Carrie RN Entered By: Epps, Carrie on 08/27/2019 12:39:18 -------------------------------------------------------------------------------- Vitals Details Patient Name: Date of Service: [name] Barnett, [name] Barnett 08/27/2019 12:30 PM Medical Record Number:[id number] Patient Account Number: [id number] Date of Birth/Sex: Treating RN: 05/04/1980 (38 y.o. Female) Epps, Carrie Primary Care Provider: SITAFALWALLA, SHARMIN Other Clinician: Referring Provider: Treating Provider/Extender:Robson, Michael SITAFALWALLA, SHARMIN Weeks in Treatment: 10 Vital Signs Time Taken: 12:31 Temperature (°F): 97.6 Height (in): 65 Pulse (bpm): 86 Weight (lbs): 130 Respiratory Rate (breaths/min): 18 Body Mass Index (BMI): 21.6 Blood Pressure (mmHg): 143/82 Reference Range: 80 - 120 mg / dl Electronic Signature(s) Signed: 08/28/2019 5:28:23 PM By: Epps, Carrie RN Entered By: Epps, Carrie on 08/27/2019 12:32:29 

## 2019-09-10 ENCOUNTER — Other Ambulatory Visit: Payer: Self-pay

## 2019-09-10 ENCOUNTER — Encounter (HOSPITAL_BASED_OUTPATIENT_CLINIC_OR_DEPARTMENT_OTHER): Payer: Medicare Other | Attending: Internal Medicine | Admitting: Internal Medicine

## 2019-09-10 DIAGNOSIS — I2510 Atherosclerotic heart disease of native coronary artery without angina pectoris: Secondary | ICD-10-CM | POA: Diagnosis not present

## 2019-09-10 DIAGNOSIS — M726 Necrotizing fasciitis: Secondary | ICD-10-CM | POA: Insufficient documentation

## 2019-09-10 DIAGNOSIS — Z992 Dependence on renal dialysis: Secondary | ICD-10-CM | POA: Diagnosis not present

## 2019-09-10 DIAGNOSIS — L89154 Pressure ulcer of sacral region, stage 4: Secondary | ICD-10-CM | POA: Diagnosis not present

## 2019-09-10 DIAGNOSIS — E1122 Type 2 diabetes mellitus with diabetic chronic kidney disease: Secondary | ICD-10-CM | POA: Diagnosis not present

## 2019-09-10 DIAGNOSIS — N186 End stage renal disease: Secondary | ICD-10-CM | POA: Diagnosis not present

## 2019-09-10 DIAGNOSIS — E1151 Type 2 diabetes mellitus with diabetic peripheral angiopathy without gangrene: Secondary | ICD-10-CM | POA: Diagnosis not present

## 2019-09-10 DIAGNOSIS — E11622 Type 2 diabetes mellitus with other skin ulcer: Secondary | ICD-10-CM | POA: Insufficient documentation

## 2019-09-10 DIAGNOSIS — E1140 Type 2 diabetes mellitus with diabetic neuropathy, unspecified: Secondary | ICD-10-CM | POA: Diagnosis not present

## 2019-09-10 DIAGNOSIS — T8131XD Disruption of external operation (surgical) wound, not elsewhere classified, subsequent encounter: Secondary | ICD-10-CM | POA: Diagnosis not present

## 2019-09-10 DIAGNOSIS — F419 Anxiety disorder, unspecified: Secondary | ICD-10-CM | POA: Insufficient documentation

## 2019-09-10 DIAGNOSIS — I132 Hypertensive heart and chronic kidney disease with heart failure and with stage 5 chronic kidney disease, or end stage renal disease: Secondary | ICD-10-CM | POA: Diagnosis not present

## 2019-09-10 DIAGNOSIS — I5032 Chronic diastolic (congestive) heart failure: Secondary | ICD-10-CM | POA: Diagnosis not present

## 2019-09-10 DIAGNOSIS — L89152 Pressure ulcer of sacral region, stage 2: Secondary | ICD-10-CM | POA: Insufficient documentation

## 2019-09-11 NOTE — Progress Notes (Signed)
[name] Barnett, KOHLMAN ([id number]) Visit Report for 09/10/2019 Debridement Details Patient Name: Date of Service: [name] DA MANNAT, BENEDETTI 09/10/2019 12:30 PM Medical Record Number: [id number] Patient Account Number: [id number] Date of Birth/Sex: Treating RN: 1980-10-04 (39 y.o. [name] Primary Care Provider: SITA FA LWA LLA, SHA RMIN Other Clinician: Referring Provider: Treating Provider/Extender: Linton Ham SITA FA LWA LLA, SHA RMIN Weeks in Treatment: 12 Debridement Performed for Assessment: Wound #1 Sacrum Performed By: Physician Ricard Dillon., MD Debridement Type: Debridement Level of Consciousness (Pre-procedure): Awake and Alert Pre-procedure Verification/Time Out Yes - 13:02 Taken: Start Time: 13:02 T Area Debrided (L x W): otal 6.5 (cm) x 3 (cm) = 19.5 (cm) Tissue and other material debrided: Viable, Non-Viable, Slough, Subcutaneous, Slough Level: Skin/Subcutaneous Tissue Debridement Description: Excisional Instrument: Curette Bleeding: Moderate Hemostasis Achieved: Pressure End Time: 13:03 Procedural Pain: 0 Post Procedural Pain: 0 Response to Treatment: Procedure was tolerated well Level of Consciousness (Post- Awake and Alert procedure): Post Debridement Measurements of Total Wound Length: (cm) 6.5 Stage: Category/Stage IV Width: (cm) 3 Depth: (cm) 0.5 Volume: (cm) 7.658 Character of Wound/Ulcer Post Debridement: Improved Post Procedure Diagnosis Same as Pre-procedure Electronic Signature(s) Signed: 09/11/2019 8:11:21 AM By: Linton Ham MD Signed: 09/11/2019 5:17:28 PM By: Levan Hurst RN, BSN Entered By: Linton Ham on 09/10/2019 13:12:01 -------------------------------------------------------------------------------- HPI Details Patient Name: Date of Service: [name] DA ALANEA, WOOLRIDGE 09/10/2019 12:30 PM Medical Record Number: [id number] Patient Account Number: [id number] Date of Birth/Sex: Treating RN: 19-Apr-1981 (39 y.o. [name] Primary Care Provider: SITA FA LWA LLA, SHA RMIN Other Clinician: Referring Provider: Treating Provider/Extender: Linton Ham SITA FA LWA LLA, SHA RMIN Weeks in Treatment: 12 History of Present Illness HPI Description: Admission 06/15/2019 This is a 39 year old woman who comes to [place] from [facility] skilled facility in [place]. I am able to follow a lot of her history on care everywhere in epic. The patient is a type II diabetic and has been on dialysis. 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 I am not exactly sure of the nature of the stroke at this present time. She has made a decent recovery and is now up walking with a walker. In August 2020 she was admitted to [facility] in [place] with L4-L5 discitis. Culture and sensitivity showed Klebsiella. She was discharged on 8 weeks of ceftazidime. In mid September she is readmitted to the hospital with a pressure ulcer on her lower sacrum and necrotizing fasciitis. She required an extensive surgical debridement. CT scan of the pelvis at 1 point showed communication with the rectum. A colostomy was recommended and placed. Culture of this area showed Klebsiella and yeast. She was discharged on an extended course of meropenem and Diflucan. [facility] [place] We do not have any of these records. I think she was discharged to another nursing home but is come to [facility] skilled facility in [place] [facility]. She was sent down here for review of the remaining wound. By review of the pictures that the patient had from September this is made considerable improvement. She tells me that the wound VAC was discontinued at the end of December 2020. Currently the facility is using a form of silver alginate to the wound surface. Past medical history type 2 diabetes with chronic renal failure on  dialysis Tuesday Thursday and Saturday. CVA with left hemiparesis in June 2020 but she appears to be  making a good recovery. Chronic diastolic heart failure, hypertension 2/19; patient's wound area slightly smaller. The area that was towards her anus has closed over. We are using Santyl covered with Hydrofera Blue. We have not been able to get her lab work apparently her veins are very difficult for phlebotomy. They are trying to get the blood work done where she dialyzes in [facility] but that requires some logistical issues. 2/26; not much change in the wound. Still a nonviable surface we have been using Santyl and Hydrofera Blue. I changed her to [facility] only. She tells me she has rigorously offloading this area she also complains of some pain 3/5; still no change here. I switch to pure Hydrofera Blue. She still complains of a lot of pain although is a big wound it seems out of proportion to what I might expect. This was initially a surgical wound secondary to necrotizing wound infection. She is on dialysis. Other than that she thinks she is offloading this fairly rigorously. She has not been systemically unwell 3/26; I switched to Iodoflex 3 weeks ago. This really seems to have helped, much better surface and slight improvement in the surface area. 4/26; the patient is still on Iodoflex she missed an appointment 2 weeks ago. Doing slightly smaller. She is on dialysis 5/10; using Hydrofera Blue. The major improvement here is the complete lack of depth. She has islands of epithelialization Electronic Signature(s) Signed: 09/11/2019 8:11:21 AM By: Linton Ham MD Entered By: Linton Ham on 09/10/2019 13:12:36 -------------------------------------------------------------------------------- Physical Exam Details Patient Name: Date of Service: [name] DA MAIYA, KATES 09/10/2019 12:30 PM Medical Record Number: [id number] Patient Account Number: [id number] Date of Birth/Sex:  Treating RN: [birth year] (39 y.o. [name] Primary Care Provider: SITA FA LWA LLA, SHA RMIN Other Clinician: Referring Provider: Treating Provider/Extender: Linton Ham SITA FA LWA LLA, SHA RMIN Weeks in Treatment: 12 Constitutional Patient is hypertensive.. Pulse regular and within target range for patient.Marland Kitchen Respirations regular, non-labored and within target range.. Temperature is normal and within the target range for the patient.Marland Kitchen Appears in no distress. Notes Wound exam; the area in question is on the lower sacrum. Wound bed itself did not look too bad but under illumination debris on the surface debrided with an open curette. Hemostasis with direct pressure. She has a small satellite area at roughly 8:00 but even that has depth. No evidence of surrounding infection Electronic Signature(s) Signed: 09/11/2019 8:11:21 AM By: Linton Ham MD Entered By: Linton Ham on 09/10/2019 13:13:51 -------------------------------------------------------------------------------- Physician Orders Details Patient Name: Date of Service: [name] DA MEGGIE, LASETER 09/10/2019 12:30 PM Medical Record Number: [id number] Patient Account Number: [id number] Date of Birth/Sex: Treating RN: [birth year] (39 y.o. [name] Primary Care Provider: SITA FA LWA LLA, SHA RMIN Other Clinician: Referring Provider: Treating Provider/Extender: Linton Ham SITA FA LWA LLA, SHA RMIN Weeks in Treatment: 12 Verbal / Phone Orders: No Diagnosis Coding ICD-10 Coding Code Description L89.154 Pressure ulcer of sacral region, stage 4 L89.152 Pressure ulcer of sacral region, stage 2 T81.31XD Disruption of external operation (surgical) wound, not elsewhere classified, subsequent encounter M72.6 Necrotizing fasciitis Follow-up Appointments Return Appointment in 2 weeks. Dressing Change Frequency Wound #1 Sacrum Change dressing every day. Skin Barriers/Peri-Wound Care Wound #1 Sacrum Skin  Prep Wound Cleansing Wound #1 Sacrum Clean wound with Wound Cleanser - or normal saline Primary Wound Dressing Wound #1 Sacrum Hydrofera Blue - Classic Secondary Dressing Wound #1 Sacrum Foam Border - or ABD Off-Loading Turn and reposition every 2  hours Electronic Signature(s) Signed: 09/11/2019 8:11:21 AM By: Linton Ham MD Signed: 09/11/2019 5:17:28 PM By: Levan Hurst RN, BSN Entered By: Levan Hurst on 09/10/2019 13:04:28 -------------------------------------------------------------------------------- Problem List Details Patient Name: Date of Service: [name] DA RUHEE, ENCK 09/10/2019 12:30 PM Medical Record Number: [id number] Patient Account Number: [id number] Date of Birth/Sex: Treating RN: Mar 23, 1981 (39 y.o. [name] Primary Care Provider: SITA FA LWA LLA, SHA RMIN Other Clinician: Referring Provider: Treating Provider/Extender: Linton Ham SITA FA LWA LLA, SHA RMIN Weeks in Treatment: 12 Active Problems ICD-10 Encounter Code Description Active Date MDM Diagnosis L89.154 Pressure ulcer of sacral region, stage 4 06/15/2019 No Yes T81.31XD Disruption of external operation (surgical) wound, not elsewhere classified, 06/15/2019 No Yes subsequent encounter M72.6 Necrotizing fasciitis 06/15/2019 No Yes Inactive Problems ICD-10 Code Description Active Date Inactive Date L89.152 Pressure ulcer of sacral region, stage 2 06/15/2019 06/15/2019 Resolved Problems Electronic Signature(s) Signed: 09/11/2019 8:11:21 AM By: Linton Ham MD Entered By: Linton Ham on 09/10/2019 13:11:42 -------------------------------------------------------------------------------- Progress Note Details Patient Name: Date of Service: [name] DA VIRGIL, SLINGER 09/10/2019 12:30 PM Medical Record Number: [id number] Patient Account Number: [id number] Date of Birth/Sex: Treating RN: [birth year] (39 y.o. [name] Primary Care Provider: SITA FA LWA LLA, SHA RMIN Other  Clinician: Referring Provider: Treating Provider/Extender: Linton Ham SITA FA LWA LLA, SHA RMIN Weeks in Treatment: 12 Subjective History of Present Illness (HPI) Admission 06/15/2019 This is a 39 year old woman who comes to [place] from [facility] skilled facility in [facility]. I am able to follow a lot of her history on care everywhere in epic. The patient is a type II diabetic and has been on dialysis. 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 I am not exactly sure of the nature of the stroke at this present time. She has made a decent recovery and is now up walking with a walker. In August 2020 she was admitted to [facility] in [place] with L4-L5 discitis. Culture and sensitivity showed Klebsiella. She was discharged on 8 weeks of ceftazidime. In mid September she is readmitted to the hospital with a pressure ulcer on her lower sacrum and necrotizing fasciitis. She required an extensive surgical debridement. CT scan of the pelvis at 1 point showed communication with the rectum. A colostomy was recommended and placed. Culture of this area showed Klebsiella and yeast. She was discharged on an extended course of meropenem and Diflucan. [facility] [place] We do not have any of these records. I think she was discharged to another nursing home but is come to [facility] skilled facility in [facility]. She was sent down here for review of the remaining wound. By review of the pictures that the patient had from September this is made considerable improvement. She tells me that the wound VAC was discontinued at the end of December 2020. Currently the facility is using a form of silver alginate to the wound surface. Past medical history type 2 diabetes with chronic renal failure on dialysis Tuesday Thursday and Saturday. CVA with left hemiparesis in June  2020 but she appears to be making a good recovery. Chronic diastolic heart failure, hypertension 2/19; patient's wound area slightly smaller. The area that was towards her anus has closed over. We are using Santyl covered with Hydrofera Blue. We have not been able to get her lab work apparently her veins are very difficult for phlebotomy. They are trying  to get the blood work done where she dialyzes in [facility] but that requires some logistical issues. 2/26; not much change in the wound. Still a nonviable surface we have been using Santyl and Hydrofera Blue. I changed her to [facility] only. She tells me she has rigorously offloading this area she also complains of some pain 3/5; still no change here. I switch to pure Hydrofera Blue. She still complains of a lot of pain although is a big wound it seems out of proportion to what I might expect. This was initially a surgical wound secondary to necrotizing wound infection. She is on dialysis. Other than that she thinks she is offloading this fairly rigorously. She has not been systemically unwell 3/26; I switched to Iodoflex 3 weeks ago. This really seems to have helped, much better surface and slight improvement in the surface area. 4/26; the patient is still on Iodoflex she missed an appointment 2 weeks ago. Doing slightly smaller. She is on dialysis 5/10; using Hydrofera Blue. The major improvement here is the complete lack of depth. She has islands of epithelialization Objective Constitutional Patient is hypertensive.. Pulse regular and within target range for patient.Marland Kitchen Respirations regular, non-labored and within target range.. Temperature is normal and within the target range for the patient.Marland Kitchen Appears in no distress. Vitals Time Taken: 12:45 PM, Height: 65 in, Weight: 130 lbs, BMI: 21.6, Temperature: 98.1 F, Pulse: 96 bpm, Respiratory Rate: 18 breaths/min, Blood Pressure: 178/105 mmHg. General Notes: Wound exam; the area in  question is on the lower sacrum. Wound bed itself did not look too bad but under illumination debris on the surface debrided with an open curette. Hemostasis with direct pressure. She has a small satellite area at roughly 8:00 but even that has depth. No evidence of surrounding infection Integumentary (Hair, Skin) Wound #1 status is Open. Original cause of wound was Pressure Injury. The wound is located on the Sacrum. The wound measures 6.5cm length x 3cm width x 0.5cm depth; 15.315cm^2 area and 7.658cm^3 volume. There is Fat Layer (Subcutaneous Tissue) Exposed exposed. There is no tunneling or undermining noted. There is a medium amount of serosanguineous drainage noted. The wound margin is epibole. There is large (67-100%) pink granulation within the wound bed. There is a small (1-33%) amount of necrotic tissue within the wound bed including Adherent Slough. Assessment Active Problems ICD-10 Pressure ulcer of sacral region, stage 4 Disruption of external operation (surgical) wound, not elsewhere classified, subsequent encounter Necrotizing fasciitis Procedures Wound #1 Pre-procedure diagnosis of Wound #1 is a Pressure Ulcer located on the Sacrum . There was a Excisional Skin/Subcutaneous Tissue Debridement with a total area of 19.5 sq cm performed by Ricard Dillon., MD. With the following instrument(s): Curette to remove Viable and Non-Viable tissue/material. Material removed includes Subcutaneous Tissue and Slough and. No specimens were taken. A time out was conducted at 13:02, prior to the start of the procedure. A Moderate amount of bleeding was controlled with Pressure. The procedure was tolerated well with a pain level of 0 throughout and a pain level of 0 following the procedure. Post Debridement Measurements: 6.5cm length x 3cm width x 0.5cm depth; 7.658cm^3 volume. Post debridement Stage noted as Category/Stage IV. Character of Wound/Ulcer Post Debridement is improved. Post  procedure Diagnosis Wound #1: Same as Pre-Procedure Plan Follow-up Appointments: Return Appointment in 2 weeks. Dressing Change Frequency: Wound #1 Sacrum: Change dressing every day. Skin Barriers/Peri-Wound Care: Wound #1 Sacrum: Skin Prep Wound Cleansing: Wound #1 Sacrum: Clean wound  with Wound Cleanser - or normal saline Primary Wound Dressing: Wound #1 Sacrum: Hydrofera Blue - Classic Secondary Dressing: Wound #1 Sacrum: Foam Border - or ABD Off-Loading: Turn and reposition every 2 hours 1. Somewhat more optimistic about the circumference and I was the other time 2. Aggressive debridement of the surface with an open curette very gritty fibrinous surface debris hemostasis with pressure dressing 3. I would like to see 7 improvement in the epithelialization as a result of this next time 4. No evidence of infection 5. The patient is at dialysis 3 times a week. May be difficult to offload this area Electronic Signature(s) Signed: 09/11/2019 8:11:21 AM By: Linton Ham MD Entered By: Linton Ham on 09/10/2019 13:15:54 -------------------------------------------------------------------------------- SuperBill Details Patient Name: Date of Service: [name] DA SANTIA, LABATE 09/10/2019 Medical Record Number: [id number] Patient Account Number: [id number] Date of Birth/Sex: Treating RN: 1980/08/04 (39 y.o. [name] Primary Care Provider: SITA FA LWA LLA, SHA RMIN Other Clinician: Referring Provider: Treating Provider/Extender: Linton Ham SITA FA LWA LLA, SHA RMIN Weeks in Treatment: 12 Diagnosis Coding ICD-10 Codes Code Description L89.154 Pressure ulcer of sacral region, stage 4 T81.31XD Disruption of external operation (surgical) wound, not elsewhere classified, subsequent encounter M72.6 Necrotizing fasciitis Facility Procedures CPT4 Code: [id number] Description: 38250 - DEB SUBQ TISSUE 20 SQ CM/< ICD-10 Diagnosis Description L89.154 Pressure ulcer of  sacral region, stage 4 Modifier: Quantity: 1 Physician Procedures : CPT4 Code Description Modifier [id number] - WC PHYS SUBQ TISS 20 SQ CM ICD-10 Diagnosis Description L89.154 Pressure ulcer of sacral region, stage 4 Quantity: 1 Electronic Signature(s) Signed: 09/11/2019 8:11:21 AM By: Linton Ham MD Entered By: Linton Ham on 09/10/2019 13:16:09

## 2019-09-11 NOTE — Progress Notes (Signed)
[name] Barnett, [name] Barnett ([id number]) Visit Report for 09/10/2019 Arrival Information Details Patient Name: [name] Barnett of Service: [name] Barnett, [name] Barnett 09/10/2019 12:30 PM Medical Record Number: [id number] Patient Account Number: [id number] [name] Barnett of Birth/Sex: Treating RN: 1980/08/04 (39 y.o. [name] Primary Care Rajanae Mantia: SITA FA LWA LLA, SHA RMIN Other Clinician: Referring Maymunah Stegemann: Treating Jalayla Chrismer/Extender: Linton Ham SITA FA LWA LLA, SHA RMIN Weeks in Treatment: 12 Visit Information History Since Last Visit Added or deleted any medications: No Patient Arrived: Wheel Chair Any new allergies or adverse reactions: No Arrival Time: 12:45 Had a fall or experienced change in No Accompanied By: caregiver activities of daily living that may affect Transfer Assistance: None risk of falls: Patient Identification Verified: Yes Signs or symptoms of abuse/neglect since last visito No Secondary Verification Process Completed: Yes Hospitalized since last visit: No Patient Requires Transmission-Based Precautions: No Implantable device outside of the clinic excluding No Patient Has Alerts: No cellular tissue based products placed in the center since last visit: Has Dressing in Place as Prescribed: Yes Pain Present Now: No Electronic Signature(s) Signed: 09/10/2019 5:18:32 PM By: Deon Pilling Entered By: Deon Pilling on 09/10/2019 12:55:32 -------------------------------------------------------------------------------- Encounter Discharge Information Details Patient Name: [name] Barnett of Service: [name] Barnett, [name] Barnett 09/10/2019 12:30 PM Medical Record Number: [id number] Patient Account Number: [id number] [name] Barnett of Birth/Sex: Treating RN: 07-Aug-1980 (39 y.o. [name] Primary Care Malkia Nippert: SITA FA LWA LLA, SHA RMIN Other Clinician: Referring Honorio Devol: Treating Daegen Berrocal/Extender: Linton Ham SITA FA LWA LLA, SHA RMIN Weeks in Treatment: 12 Encounter Discharge Information  Items Post Procedure Vitals Discharge Condition: Stable Temperature (F): 98.1 Ambulatory Status: Wheelchair Pulse (bpm): 96 Discharge Destination: Home Respiratory Rate (breaths/min): 18 Transportation: Private Auto Blood Pressure (mmHg): 178/105 Accompanied By: caregiver Schedule Follow-up Appointment: Yes Clinical Summary of Care: Electronic Signature(s) Signed: 09/10/2019 5:18:32 PM By: Deon Pilling Entered By: Deon Pilling on 09/10/2019 13:20:03 -------------------------------------------------------------------------------- Lower Extremity Assessment Details Patient Name: [name] Barnett of Service: [name] Barnett, [name] Barnett 09/10/2019 12:30 PM Medical Record Number: [id number] Patient Account Number: [id number] [name] Barnett of Birth/Sex: Treating RN: 12-Sep-1980 (39 y.o. [name] Primary Care Aki Burdin: SITA FA Janace Aris, SHA RMIN Other Clinician: Referring Asuna Peth: Treating Taysean Wager/Extender: Linton Ham SITA FA LWA LLA, SHA RMIN Weeks in Treatment: 12 Electronic Signature(s) Signed: 09/10/2019 5:18:32 PM By: Deon Pilling Signed: 09/11/2019 5:17:28 PM By: Levan Hurst RN, BSN Entered By: Deon Pilling on 09/10/2019 12:56:09 -------------------------------------------------------------------------------- Multi Wound Chart Details Patient Name: [name] Barnett of Service: [name] Barnett, [name] Barnett 09/10/2019 12:30 PM Medical Record Number: [id number] Patient Account Number: [id number] [name] Barnett of Birth/Sex: Treating RN: 03-25-1981 (39 y.o. 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] [N/A:N/A] Secondary Etiology: [1:Cataracts, Glaucoma, Anemia,] [N/A:N/A] Comorbid History: [1:Congestive Heart Failure, Coronary Artery Disease, Hypertension, Type II Diabetes, End Stage Renal Disease, Neuropathy, Confinement Anxiety 01/11/2019] [N/A:N/A] [name] Barnett Acquired: [1:12] [N/A:N/A] Weeks of Treatment: [1:Open] [N/A:N/A] Wound Status: [1:6.5x3x0.5] [N/A:N/A] Measurements L x W x D (cm) [1:15.315] [N/A:N/A] A (cm) : rea [1:7.658] [N/A:N/A] Volume (cm) : [1:59.40%] [N/A:N/A] % Reduction in A rea: [1:74.60%] [N/A:N/A] % Reduction in Volume: [1:Category/Stage IV] [N/A:N/A] Classification: [1:Medium] [N/A:N/A] Exudate A mount: [1:Serosanguineous] [N/A:N/A] Exudate Type: [1:red, brown] [  N/A:N/A] Exudate Color: [1:Epibole] [N/A:N/A] Wound Margin: [1:Large (67-100%)] [N/A:N/A] Granulation A mount: [1:Pink] [N/A:N/A] Granulation Quality: [1:Small (1-33%)] [N/A:N/A] Necrotic A mount: [1:Fat Layer (Subcutaneous Tissue)] [N/A:N/A] Exposed Structures: [1:Exposed: Yes Fascia: No Tendon: No Muscle: No Joint: No Bone: No Small (1-33%)] [N/A:N/A] Epithelialization: [1:Debridement - Excisional] [N/A:N/A] Debridement: [1:13:02] [N/A:N/A] Pre-procedure Verification/Time Out Taken: [1:Subcutaneous, Slough] [N/A:N/A] Tissue Debrided: [1:Skin/Subcutaneous Tissue] [N/A:N/A] Level: [1:19.5] [N/A:N/A] Debridement A (sq cm): [1:rea Curette] [N/A:N/A] Instrument: [1:Moderate] [N/A:N/A] Bleeding: [1:Pressure] [N/A:N/A] Hemostasis A chieved: [1:0] [N/A:N/A] Procedural Pain: [1:0] [N/A:N/A] Post Procedural Pain: [1:Procedure was tolerated well] [N/A:N/A] Debridement Treatment Response: [1:6.5x3x0.5] [N/A:N/A] Post Debridement Measurements L x W x D (cm) [1:7.658] [N/A:N/A] Post Debridement Volume: (cm) [1:Category/Stage IV] [N/A:N/A] Post Debridement Stage: [1:Debridement] [N/A:N/A] Treatment  Notes Electronic Signature(s) Signed: 09/11/2019 8:11:21 AM By: Linton Ham MD Signed: 09/11/2019 5:17:28 PM By: Levan Hurst RN, BSN Entered By: Linton Ham on 09/10/2019 13:11:51 -------------------------------------------------------------------------------- Multi-Disciplinary Care Plan Details Patient Name: [name] Barnett of Service: [name] Barnett, [name] Barnett 09/10/2019 12:30 PM Medical Record Number: [id number] Patient Account Number: [id number] [name] Barnett of Birth/Sex: Treating RN: 1980/10/19 (39 y.o. [name] Primary Care Zannie Runkle: SITA FA LWA LLA, SHA RMIN Other Clinician: Referring Eman Morimoto: Treating Voris Tigert/Extender: Linton Ham SITA FA LWA LLA, SHA RMIN Weeks in Treatment: 12 Active Inactive Wound/Skin Impairment Nursing Diagnoses: Impaired tissue integrity Goals: Patient/caregiver will verbalize understanding of skin care regimen [name] Barnett Initiated: 08/27/2019 Target Resolution [name] Barnett: 09/28/2019 Goal Status: Active Ulcer/skin breakdown will have a volume reduction of 50% by week 8 [name] Barnett Initiated: 06/15/2019 [name] Barnett Inactivated: 08/27/2019 Target Resolution [name] Barnett: 08/03/2019 Goal Status: Met Interventions: Assess patient/caregiver ability to obtain necessary supplies Assess patient/caregiver ability to perform ulcer/skin care regimen upon admission and as needed Provide education on ulcer and skin care Notes: Electronic Signature(s) Signed: 09/11/2019 5:17:28 PM By: Levan Hurst RN, BSN Entered By: Levan Hurst on 09/10/2019 12:59:30 -------------------------------------------------------------------------------- Pain Assessment Details Patient Name: [name] Barnett of Service: [name] Barnett, [name] Barnett 09/10/2019 12:30 PM Medical Record Number: [id number] Patient Account Number: [id number] [name] Barnett of Birth/Sex: Treating RN: 1980/09/02 (39 y.o. [name] Primary Care Sontee Desena: SITA FA LWA LLA, SHA RMIN Other Clinician: Referring Kyser Wandel: Treating Amar Keenum/Extender: Linton Ham SITA FA LWA LLA, SHA RMIN Weeks in Treatment: 12 Active Problems Location of Pain Severity and Description of Pain Patient Has Paino No Site Locations Rate the pain. Current Pain Level: 0 Pain Management and Medication Current Pain Management: Medication: No Cold Application: No Rest: No Massage: No Activity: No T.E.N.S.: No Heat Application: No Leg drop or elevation: No Is the Current Pain Management Adequate: Adequate How does your wound impact your activities of daily livingo Sleep: No Bathing: No Appetite: No Relationship With Others: No Bladder Continence: No Emotions: No Bowel Continence: No Work: No Toileting: No Drive: No Dressing: No Hobbies: No Electronic Signature(s) Signed: 09/10/2019 5:18:32 PM By: Deon Pilling Signed: 09/11/2019 5:17:28 PM By: Levan Hurst RN, BSN Entered By: Deon Pilling on 09/10/2019 12:56:02 -------------------------------------------------------------------------------- Patient/Caregiver Education Details Patient Name: [name] Barnett of Service: [name] Barnett, [name] Barnett 5/10/2021andnbsp12:30 PM Medical Record Number: [id number] Patient Account Number: [id number] [name] Barnett of Birth/Gender: Treating RN: November 24, 1980 (39 y.o. [name] Primary Care Physician: SITA FA LWA LLA, SHA RMIN Other Clinician: Referring Physician: Treating Physician/Extender: Linton Ham SITA FA LWA LLA, SHA RMIN Weeks in Treatment: 12 Education Assessment Education Provided To: Patient Education Topics Provided Wound/Skin Impairment: Methods: Explain/Verbal Responses: State content correctly Electronic Signature(s) Signed: 09/11/2019 5:17:28 PM By: Levan Hurst RN, BSN Entered By: Levan Hurst on 09/10/2019 12:59:43 -------------------------------------------------------------------------------- Wound  Assessment Details Patient Name: [name] Barnett of Service: [name] Barnett, RUNNER 09/10/2019 12:30 PM Medical Record Number: [id number] Patient  Account Number: [id number] [name] Barnett of Birth/Sex: Treating RN: 07-06-1980 (39 y.o. [name] Primary Care Nakiea Metzner: SITA FA LWA LLA, SHA RMIN Other Clinician: Referring Nijae Doyel: Treating Aryel Edelen/Extender: Linton Ham SITA FA LWA LLA, SHA RMIN Weeks in Treatment: 12 Wound Status Wound Number: 1 Primary Pressure Ulcer Etiology: Wound Location: Sacrum Secondary Necrotizing Infection Wounding Event: Pressure Injury Etiology: [name] Barnett Acquired: 01/11/2019 Wound Open Weeks Of Treatment: 12 Status: Clustered Wound: No Comorbid Cataracts, Glaucoma, Anemia, Congestive Heart Failure, Coronary History: Artery Disease, Hypertension, Type II Diabetes, End Stage Renal Disease, Neuropathy, Confinement Anxiety Photos Photo Uploaded By: Mikeal Hawthorne on 09/11/2019 09:15:24 Wound Measurements Length: (cm) 6.5 Width: (cm) 3 Depth: (cm) 0.5 Area: (cm) 15.315 Volume: (cm) 7.658 % Reduction in Area: 59.4% % Reduction in Volume: 74.6% Epithelialization: Small (1-33%) Tunneling: No Undermining: No Wound Description Classification: Category/Stage IV Wound Margin: Epibole Exudate Amount: Medium Exudate Type: Serosanguineous Exudate Color: red, brown Wound Bed Granulation Amount: Large (67-100%) Granulation Quality: Pink Necrotic Amount: Small (1-33%) Necrotic Quality: Adherent Slough Foul Odor After Cleansing: No Slough/Fibrino Yes Exposed Structure Fascia Exposed: No Fat Layer (Subcutaneous Tissue) Exposed: Yes Tendon Exposed: No Muscle Exposed: No Joint Exposed: No Bone Exposed: No Treatment Notes Wound #1 (Sacrum) 1. Cleanse With Wound Cleanser 2. Periwound Care Skin Prep 3. Primary Dressing Applied Hydrofera Blue 4. Secondary Dressing Foam Border Dressing 5. Secured With Office manager) Signed: 09/10/2019 5:18:32 PM By: Deon Pilling Signed: 09/11/2019 5:17:28 PM By: Levan Hurst RN, BSN Entered By: Deon Pilling on 09/10/2019  12:56:37 -------------------------------------------------------------------------------- Vitals Details Patient Name: [name] Barnett of Service: [name] DA LAVAEH, BAU 09/10/2019 12:30 PM Medical Record Number: [id number] Patient Account Number: [id number] [name] Barnett of Birth/Sex: Treating RN: [birth year] (39 y.o. [name] Primary Care Zaylynn Rickett: SITA FA LWA LLA, SHA RMIN Other Clinician: Referring Abshir Paolini: Treating Analeese Andreatta/Extender: Linton Ham SITA FA LWA LLA, SHA RMIN Weeks in Treatment: 12 Vital Signs Time Taken: 12:45 Temperature (F): 98.1 Height (in): 65 Pulse (bpm): 96 Weight (lbs): 130 Respiratory Rate (breaths/min): 18 Body Mass Index (BMI): 21.6 Blood Pressure (mmHg): 178/105 Reference Range: 80 - 120 mg / dl Electronic Signature(s) Signed: 09/10/2019 5:18:32 PM By: Deon Pilling Entered By: Deon Pilling on 09/10/2019 12:55:49

## 2019-09-24 ENCOUNTER — Other Ambulatory Visit: Payer: Self-pay

## 2019-09-24 ENCOUNTER — Encounter (HOSPITAL_BASED_OUTPATIENT_CLINIC_OR_DEPARTMENT_OTHER): Payer: Medicare Other | Admitting: Internal Medicine

## 2019-09-24 DIAGNOSIS — E11622 Type 2 diabetes mellitus with other skin ulcer: Secondary | ICD-10-CM | POA: Diagnosis not present

## 2019-09-25 NOTE — Progress Notes (Signed)
[name] Barnett, [name] Barnett ([id number]) Visit Report for 09/24/2019 Arrival Information Details Patient Name: Date of Service: [name] Barnett, [name] Barnett 09/24/2019 12:30 PM Medical Record Number: [id number] Patient Account Number: [id number] Date of Birth/Sex: Treating RN: 08/04/1980 (39 y.o. F) Epps, [place] Primary Care Casha Estupinan: SITA FA LWA LLA, SHA RMIN Other Clinician: Referring Clarisse Rodriges: Treating Elisha Mcgruder/Extender: Tobi Bastos SITA FA LWA LLA, SHA RMIN Weeks in Treatment: 14 Visit Information History Since Last Visit All ordered tests and consults were completed: No Patient Arrived: Wheel Chair Added or deleted any medications: No Arrival Time: 12:46 Any new allergies or adverse reactions: No Accompanied By: caregiver Had a fall or experienced change in No Transfer Assistance: None activities of daily living that may affect Patient Identification Verified: Yes risk of falls: Secondary Verification Process Completed: Yes Signs or symptoms of abuse/neglect since last visito No Patient Requires Transmission-Based Precautions: No Hospitalized since last visit: No Patient Has Alerts: No Implantable device outside of the clinic excluding No cellular tissue based products placed in the center since last visit: Has Dressing in Place as Prescribed: Yes Pain Present Now: Yes Electronic Signature(s) Signed: 09/25/2019 5:17:39 PM By: Carlene Coria RN Entered By: Carlene Coria on 09/24/2019 12:46:35 -------------------------------------------------------------------------------- Clinic Level of Care Assessment Details Patient Name: Date of Service: [name] Barnett, [name] Barnett 09/24/2019 12:30 PM Medical Record Number: [id number] Patient Account Number: [id number] Date of Birth/Sex: Treating RN: Nov 18, 1980 (39 y.o. [name] Primary Care Braxton Vantrease: SITA FA LWA LLA, SHA RMIN Other Clinician: Referring Pasha Broad: Treating Jakavion Bilodeau/Extender: Tobi Bastos SITA FA LWA LLA, SHA RMIN Weeks in  Treatment: 14 Clinic Level of Care Assessment Items TOOL 4 Quantity Score X- 1 0 Use when only an EandM is performed on FOLLOW-UP visit ASSESSMENTS - Nursing Assessment / Reassessment X- 1 10 Reassessment of Co-morbidities (includes updates in patient status) X- 1 5 Reassessment of Adherence to Treatment Plan ASSESSMENTS - Wound and Skin A ssessment / Reassessment X - Simple Wound Assessment / Reassessment - one wound 1 5 []  - 0 Complex Wound Assessment / Reassessment - multiple wounds []  - 0 Dermatologic / Skin Assessment (not related to wound area) ASSESSMENTS - Focused Assessment []  - 0 Circumferential Edema Measurements - multi extremities []  - 0 Nutritional Assessment / Counseling / Intervention []  - 0 Lower Extremity Assessment (monofilament, tuning fork, pulses) []  - 0 Peripheral Arterial Disease Assessment (using hand held doppler) ASSESSMENTS - Ostomy and/or Continence Assessment and Care []  - 0 Incontinence Assessment and Management []  - 0 Ostomy Care Assessment and Management (repouching, etc.) PROCESS - Coordination of Care X - Simple Patient / Family Education for ongoing care 1 15 []  - 0 Complex (extensive) Patient / Family Education for ongoing care X- 1 10 Staff obtains Programmer, systems, Records, T Results / Process Orders est X- 1 10 Staff telephones HHA, Nursing Homes / Clarify orders / etc []  - 0 Routine Transfer to another Facility (non-emergent condition) []  - 0 Routine Hospital Admission (non-emergent condition) []  - 0 New Admissions / Biomedical engineer / Ordering NPWT Apligraf, etc. , []  - 0 Emergency Hospital Admission (emergent condition) X- 1 10 Simple Discharge Coordination []  - 0 Complex (extensive) Discharge Coordination PROCESS - Special Needs []  - 0 Pediatric / Minor Patient Management []  - 0 Isolation Patient Management []  - 0 Hearing / Language / Visual special needs []  - 0 Assessment of Community assistance (transportation,  D/C planning, etc.) []  - 0 Additional assistance / Altered mentation []  - 0 Support Surface(s) Assessment (bed, cushion, seat, etc.) INTERVENTIONS -  Wound Cleansing / Measurement X - Simple Wound Cleansing - one wound 1 5 []  - 0 Complex Wound Cleansing - multiple wounds X- 1 5 Wound Imaging (photographs - any number of wounds) []  - 0 Wound Tracing (instead of photographs) X- 1 5 Simple Wound Measurement - one wound []  - 0 Complex Wound Measurement - multiple wounds INTERVENTIONS - Wound Dressings X - Small Wound Dressing one or multiple wounds 1 10 []  - 0 Medium Wound Dressing one or multiple wounds []  - 0 Large Wound Dressing one or multiple wounds X- 1 5 Application of Medications - topical []  - 0 Application of Medications - injection INTERVENTIONS - Miscellaneous []  - 0 External ear exam []  - 0 Specimen Collection (cultures, biopsies, blood, body fluids, etc.) []  - 0 Specimen(s) / Culture(s) sent or taken to Lab for analysis []  - 0 Patient Transfer (multiple staff / Civil Service fast streamer / Similar devices) []  - 0 Simple Staple / Suture removal (25 or less) []  - 0 Complex Staple / Suture removal (26 or more) []  - 0 Hypo / Hyperglycemic Management (close monitor of Blood Glucose) []  - 0 Ankle / Brachial Index (ABI) - do not check if billed separately X- 1 5 Vital Signs Has the patient been seen at the hospital within the last three years: Yes Total Score: 100 Level Of Care: New/Established - Level 3 Electronic Signature(s) Signed: 09/24/2019 5:12:22 PM By: Levan Hurst RN, BSN Entered By: Levan Hurst on 09/24/2019 13:25:06 -------------------------------------------------------------------------------- Encounter Discharge Information Details Patient Name: Date of Service: [name] Barnett, [name] Barnett 09/24/2019 12:30 PM Medical Record Number: [id number] Patient Account Number: [id number] Date of Birth/Sex: Treating RN: 1980/11/19 (39 y.o. F) Dwiggins, [facility] Primary  Care Awesome Jared: SITA FA LWA LLA, SHA RMIN Other Clinician: Referring Maezie Justin: Treating Almeter Westhoff/Extender: Tobi Bastos SITA FA LWA LLA, SHA RMIN Weeks in Treatment: 14 Encounter Discharge Information Items Discharge Condition: Stable Ambulatory Status: Wheelchair Discharge Destination: [place] Telephoned: No Orders Sent: Yes Transportation: Other Accompanied By: caregiver Schedule Follow-up Appointment: Yes Clinical Summary of Care: Patient Declined Electronic Signature(s) Signed: 09/24/2019 4:34:07 PM By: Kela Millin Entered By: Kela Millin on 09/24/2019 13:07:37 -------------------------------------------------------------------------------- Lower Extremity Assessment Details Patient Name: Date of Service: [name] Barnett, [name] Barnett 09/24/2019 12:30 PM Medical Record Number: [id number] Patient Account Number: [id number] Date of Birth/Sex: Treating RN: [birth year] (39 y.o. Orvan Falconer Primary Care Aadan Chenier: SITA FA LWA LLA, SHA RMIN Other Clinician: Referring Rayshawn Maney: Treating Jamison Yuhasz/Extender: Tobi Bastos SITA FA LWA LLA, SHA RMIN Weeks in Treatment: 14 Electronic Signature(s) Signed: 09/25/2019 5:17:39 PM By: Carlene Coria RN Entered By: Carlene Coria on 09/24/2019 12:48:12 -------------------------------------------------------------------------------- Multi-Disciplinary Care Plan Details Patient Name: Date of Service: [name] Barnett, [name] Barnett 09/24/2019 12:30 PM Medical Record Number: [id number] Patient Account Number: [id number] Date of Birth/Sex: Treating RN: 1980-05-24 (39 y.o. [name] Primary Care Tailyn Hantz: SITA FA LWA LLA, SHA RMIN Other Clinician: Referring Cynai Skeens: Treating Dessiree Sze/Extender: Tobi Bastos SITA FA LWA LLA, SHA RMIN Weeks in Treatment: 14 Active Inactive Wound/Skin Impairment Nursing Diagnoses: Impaired tissue integrity Goals: Patient/caregiver will verbalize understanding of skin care  regimen Date Initiated: 08/27/2019 Target Resolution Date: 10/26/2019 Goal Status: Active Ulcer/skin breakdown will have a volume reduction of 50% by week 8 Date Initiated: 06/15/2019 Date Inactivated: 08/27/2019 Target Resolution Date: 08/03/2019 Goal Status: Met Interventions: Assess patient/caregiver ability to obtain necessary supplies Assess patient/caregiver ability to perform ulcer/skin care regimen upon admission and as needed Provide education on ulcer and skin care Notes: Electronic Signature(s) Signed: 09/24/2019 5:12:22 PM  By: Levan Hurst RN, BSN Entered By: Levan Hurst on 09/24/2019 12:44:22 -------------------------------------------------------------------------------- Pain Assessment Details Patient Name: Date of Service: [name] Barnett, [name] Barnett 09/24/2019 12:30 PM Medical Record Number: [id number] Patient Account Number: [id number] Date of Birth/Sex: Treating RN: [birth year] (39 y.o. F) Epps, [place] Primary Care Angelica Wix: SITA FA LWA LLA, SHA RMIN Other Clinician: Referring Cierrah Dace: Treating Lore Polka/Extender: Tobi Bastos SITA FA LWA LLA, SHA RMIN Weeks in Treatment: 14 Active Problems Location of Pain Severity and Description of Pain Patient Has Paino Yes Site Locations With Dressing Change: Yes Duration of the Pain. Constant / Intermittento Constant Rate the pain. Current Pain Level: 7 Worst Pain Level: 9 Least Pain Level: 5 Tolerable Pain Level: 5 Pain Management and Medication Current Pain Management: Medication: Yes Cold Application: No Rest: Yes Massage: No Activity: No T.E.N.S.: No Heat Application: No Leg drop or elevation: No Is the Current Pain Management Adequate: Inadequate How does your wound impact your activities of daily livingo Sleep: Yes Bathing: No Appetite: Yes Relationship With Others: No Bladder Continence: No Emotions: No Bowel Continence: No Work: No Toileting: No Drive: No Dressing: No Hobbies: No Electronic  Signature(s) Signed: 09/25/2019 5:17:39 PM By: Carlene Coria RN Entered By: Carlene Coria on 09/24/2019 12:47:58 -------------------------------------------------------------------------------- Patient/Caregiver Education Details Patient Name: Date of Service: [name] DA Kirby Crigler 5/24/2021andnbsp12:30 PM Medical Record Number: [id number] Patient Account Number: [id number] Date of Birth/Gender: Treating RN: Aug 30, 1980 (39 y.o. [name] Primary Care Physician: SITA FA LWA LLA, SHA RMIN Other Clinician: Referring Physician: Treating Physician/Extender: Tobi Bastos SITA FA LWA LLA, SHA RMIN Weeks in Treatment: 14 Education Assessment Education Provided To: Patient Education Topics Provided Wound/Skin Impairment: Methods: Explain/Verbal Responses: State content correctly Electronic Signature(s) Signed: 09/24/2019 5:12:22 PM By: Levan Hurst RN, BSN Entered By: Levan Hurst on 09/24/2019 12:45:24 -------------------------------------------------------------------------------- Wound Assessment Details Patient Name: Date of Service: [name] Barnett, [name] Barnett 09/24/2019 12:30 PM Medical Record Number: [id number] Patient Account Number: [id number] Date of Birth/Sex: Treating RN: [birth year] (39 y.o. F) Epps, [place] Primary Care Nylen Creque: SITA FA LWA LLA, SHA RMIN Other Clinician: Referring Breiana Stratmann: Treating Numair Masden/Extender: Tobi Bastos SITA FA LWA LLA, SHA RMIN Weeks in Treatment: 14 Wound Status Wound Number: 1 Primary Pressure Ulcer Etiology: Wound Location: Sacrum Secondary Necrotizing Infection Wounding Event: Pressure Injury Etiology: Date Acquired: 01/11/2019 Wound Open Weeks Of Treatment: 14 Status: Clustered Wound: No Comorbid Cataracts, Glaucoma, Anemia, Congestive Heart Failure, History: Coronary Artery Disease, Hypertension, Type II Diabetes, End Stage Renal Disease, Neuropathy, Confinement Anxiety Wound Measurements Length: (cm) 6 Width:  (cm) 3.3 Depth: (cm) 0.5 Area: (cm) 15.551 Volume: (cm) 7.775 % Reduction in Area: 58.7% % Reduction in Volume: 74.2% Epithelialization: Small (1-33%) Tunneling: No Undermining: No Wound Description Classification: Category/Stage IV Wound Margin: Epibole Exudate Amount: Medium Exudate Type: Serosanguineous Exudate Color: red, brown Foul Odor After Cleansing: No Slough/Fibrino Yes Wound Bed Granulation Amount: Large (67-100%) Exposed Structure Granulation Quality: Pink Fascia Exposed: No Necrotic Amount: Small (1-33%) Fat Layer (Subcutaneous Tissue) Exposed: Yes Necrotic Quality: Adherent Slough Tendon Exposed: No Muscle Exposed: No Joint Exposed: No Bone Exposed: No Treatment Notes Wound #1 (Sacrum) 1. Cleanse With Wound Cleanser 2. Periwound Care Skin Prep 3. Primary Dressing Applied Hydrofera Blue 4. Secondary Dressing Foam Border Dressing Electronic Signature(s) Signed: 09/25/2019 5:17:39 PM By: Carlene Coria RN Entered By: Carlene Coria on 09/24/2019 12:52:51 -------------------------------------------------------------------------------- Vitals Details Patient Name: Date of Service: [name] DA TIWANNA, TUCH 09/24/2019 12:30 PM Medical Record Number: [id number] Patient Account Number: [id number] Date of Birth/Sex: Treating RN: March 30, 1981 (39 y.o.  F) Epps, [place] Primary Care Jaxiel Kines: SITA FA LWA LLA, SHA RMIN Other Clinician: Referring Aarin Sparkman: Treating Jevante Hollibaugh/Extender: Tobi Bastos SITA FA LWA LLA, SHA RMIN Weeks in Treatment: 14 Vital Signs Time Taken: 12:46 Temperature (F): 98.2 Height (in): 65 Pulse (bpm): 89 Weight (lbs): 130 Respiratory Rate (breaths/min): 18 Body Mass Index (BMI): 21.6 Blood Pressure (mmHg): 148/84 Reference Range: 80 - 120 mg / dl Electronic Signature(s) Signed: 09/25/2019 5:17:39 PM By: Carlene Coria RN Entered By: Carlene Coria on 09/24/2019 12:47:04

## 2019-09-27 NOTE — Progress Notes (Signed)
[name] Barnett, [name] Barnett ([id number]) Visit Report for 09/24/2019 HPI Details Patient Name: Date of Service: [name] Barnett, [name] Barnett 09/24/2019 12:30 PM Medical Record Number: [id number] Patient Account Number: [id number] Date of Birth/Sex: Treating RN: Sep 06, 1980 (39 y.o. [name] Primary Care Provider: SITA FA LWA LLA, SHA RMIN Other Clinician: Referring Provider: Treating Provider/Extender: Tobi Bastos SITA FA LWA LLA, SHA RMIN Weeks in Treatment: 14 History of Present Illness HPI Description: Admission 06/15/2019 This is a 39 year old woman who comes to [place] from [facility] skilled facility in [place]. I am able to follow a lot of her history on care everywhere in epic. The patient is a type II diabetic and has been on dialysis. 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 I am not exactly sure of the nature of the stroke at this present time. She has made a decent recovery and is now up walking with a walker. In August 2020 she was admitted to [facility] in [place] with L4-L5 discitis. Culture and sensitivity showed Klebsiella. She was discharged on 8 weeks of ceftazidime. In mid September she is readmitted to the hospital with a pressure ulcer on her lower sacrum and necrotizing fasciitis. She required an extensive surgical debridement. CT scan of the pelvis at 1 point showed communication with the rectum. A colostomy was recommended and placed. Culture of this area showed Klebsiella and yeast. She was discharged on an extended course of meropenem and Diflucan. [facility] [place] We do not have any of these records. I think she was discharged to another nursing home but is come to [facility] skilled facility in [facility]. She was sent down here for review of the remaining wound. By review of the pictures that the patient had from  September this is made considerable improvement. She tells me that the wound VAC was discontinued at the end of December 2020. Currently the facility is using a form of silver alginate to the wound surface. Past medical history type 2 diabetes with chronic renal failure on dialysis Tuesday Thursday and Saturday. CVA with left hemiparesis in June 2020 but she appears to be making a good recovery. Chronic diastolic heart failure, hypertension 2/19; patient's wound area slightly smaller. The area that was towards her anus has closed over. We are using Santyl covered with Hydrofera Blue. We have not been able to get her lab work apparently her veins are very difficult for phlebotomy. They are trying to get the blood work done where she dialyzes in [facility] but that requires some logistical issues. 2/26; not much change in the wound. Still a nonviable surface we have been using Santyl and Hydrofera Blue. I changed her to [facility] only. She tells me she has rigorously offloading this area she also complains of some pain 3/5; still no change here. I switch to pure Hydrofera Blue. She still complains of a lot of pain although is a big wound it seems out of proportion to what I might expect. This was initially a surgical wound secondary to necrotizing wound infection. She is on dialysis. Other than that she thinks she is offloading this fairly rigorously. She has not been systemically unwell 3/26; I switched to Iodoflex 3 weeks ago. This really seems to have helped, much better surface and slight improvement in the surface area. 4/26; the patient is still on Iodoflex she missed an appointment 2 weeks ago.  Doing slightly smaller. She is on dialysis 5/10; using Hydrofera Blue. The major improvement here is the complete lack of depth. She has islands of epithelialization 09/24/19-Patient comes in at 2 weeks, dimensions about the same, there is an [place] of epithelium at margin of wound, no slough  noted Electronic Signature(s) Signed: 09/24/2019 1:05:56 PM By: Tobi Bastos MD, MBA Entered By: Tobi Bastos on 09/24/2019 13:05:55 -------------------------------------------------------------------------------- Physical Exam Details Patient Name: Date of Service: [name] Barnett, [name] Barnett 09/24/2019 12:30 PM Medical Record Number: [id number] Patient Account Number: [id number] Date of Birth/Sex: Treating RN: [birth year] (39 y.o. [name] Primary Care Provider: SITA FA LWA LLA, SHA RMIN Other Clinician: Referring Provider: Treating Provider/Extender: Tobi Bastos SITA FA LWA LLA, SHA RMIN Weeks in Treatment: 14 Constitutional alert and oriented x 3. sitting or standing blood pressure is within target range for patient.. supine blood pressure is within target range for patient.. pulse regular and within target range for patient.Marland Kitchen respirations regular, non-labored and within target range for patient.Marland Kitchen temperature within target range for patient.. . . Well- nourished and well-hydrated in no acute distress. Notes Sacral wound with same diemensions but new epithelial growth noted Electronic Signature(s) Signed: 09/24/2019 1:07:26 PM By: Tobi Bastos MD, MBA Entered By: Tobi Bastos on 09/24/2019 13:07:25 -------------------------------------------------------------------------------- Physician Orders Details Patient Name: Date of Service: [name] Barnett, [name] Barnett 09/24/2019 12:30 PM Medical Record Number: [id number] Patient Account Number: [id number] Date of Birth/Sex: Treating RN: 09/05/1980 (39 y.o. [name] Primary Care Provider: SITA FA LWA LLA, SHA RMIN Other Clinician: Referring Provider: Treating Provider/Extender: Tobi Bastos SITA FA LWA LLA, SHA RMIN Weeks in Treatment: 14 Verbal / Phone Orders: No Diagnosis Coding ICD-10 Coding Code Description L89.154 Pressure ulcer of sacral region, stage 4 T81.31XD Disruption of external operation  (surgical) wound, not elsewhere classified, subsequent encounter M72.6 Necrotizing fasciitis Follow-up Appointments Return appointment in 3 weeks. Dressing Change Frequency Wound #1 Sacrum Change dressing every day. Skin Barriers/Peri-Wound Care Wound #1 Sacrum Skin Prep Wound Cleansing Wound #1 Sacrum Clean wound with Wound Cleanser - or normal saline Primary Wound Dressing Wound #1 Sacrum Hydrofera Blue - Classic Secondary Dressing Wound #1 Sacrum Foam Border - or ABD Off-Loading Turn and reposition every 2 hours Electronic Signature(s) Signed: 09/24/2019 5:12:22 PM By: Levan Hurst RN, BSN Signed: 09/27/2019 4:57:23 PM By: Tobi Bastos MD, MBA Entered By: Levan Hurst on 09/24/2019 13:02:15 -------------------------------------------------------------------------------- Problem List Details Patient Name: Date of Service: [name] Barnett, [name] Barnett 09/24/2019 12:30 PM Medical Record Number: [id number] Patient Account Number: [id number] Date of Birth/Sex: Treating RN: 02/21/1981 (39 y.o. [name] Primary Care Provider: SITA FA LWA LLA, SHA RMIN Other Clinician: Referring Provider: Treating Provider/Extender: Tobi Bastos SITA FA LWA LLA, SHA RMIN Weeks in Treatment: 14 Active Problems ICD-10 Encounter Code Description Active Date MDM Diagnosis L89.154 Pressure ulcer of sacral region, stage 4 06/15/2019 No Yes T81.31XD Disruption of external operation (surgical) wound, not elsewhere classified, 06/15/2019 No Yes subsequent encounter M72.6 Necrotizing fasciitis 06/15/2019 No Yes Inactive Problems ICD-10 Code Description Active Date Inactive Date L89.152 Pressure ulcer of sacral region, stage 2 06/15/2019 06/15/2019 Resolved Problems Electronic Signature(s) Signed: 09/24/2019 5:12:22 PM By: Levan Hurst RN, BSN Signed: 09/27/2019 4:57:23 PM By: Tobi Bastos MD, MBA Entered By: Levan Hurst on 09/24/2019  12:43:48 -------------------------------------------------------------------------------- Progress Note Details Patient Name: Date of Service: [name] Barnett, [name] Barnett 09/24/2019 12:30 PM Medical Record Number: [id number] Patient Account Number: [id number] Date of Birth/Sex: Treating RN: [birth year] (39 y.o. [name] Primary Care Provider: Lynann Beaver  FA LWA LLA, SHA RMIN Other Clinician: Referring Provider: Treating Provider/Extender: Tobi Bastos SITA FA LWA LLA, SHA RMIN Weeks in Treatment: 14 Subjective History of Present Illness (HPI) Admission 06/15/2019 This is a 39 year old woman who comes to [place] from [facility] skilled facility in [place]. I am able to follow a lot of her history on care everywhere in epic. The patient is a type II diabetic and has been on dialysis. 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 I am not exactly sure of the nature of the stroke at this present time. She has made a decent recovery and is now up walking with a walker. In August 2020 she was admitted to [facility] in [place] with L4-L5 discitis. Culture and sensitivity showed Klebsiella. She was discharged on 8 weeks of ceftazidime. In mid September she is readmitted to the hospital with a pressure ulcer on her lower sacrum and necrotizing fasciitis. She required an extensive surgical debridement. CT scan of the pelvis at 1 point showed communication with the rectum. A colostomy was recommended and placed. Culture of this area showed Klebsiella and yeast. She was discharged on an extended course of meropenem and Diflucan. [facility] [place] We do not have any of these records. I think she was discharged to another nursing home but is come to [facility] skilled facility in [facility]. She was sent down here for review of the remaining wound. By review of  the pictures that the patient had from September this is made considerable improvement. She tells me that the wound VAC was discontinued at the end of December 2020. Currently the facility is using a form of silver alginate to the wound surface. Past medical history type 2 diabetes with chronic renal failure on dialysis Tuesday Thursday and Saturday. CVA with left hemiparesis in June 2020 but she appears to be making a good recovery. Chronic diastolic heart failure, hypertension 2/19; patient's wound area slightly smaller. The area that was towards her anus has closed over. We are using Santyl covered with Hydrofera Blue. We have not been able to get her lab work apparently her veins are very difficult for phlebotomy. They are trying to get the blood work done where she dialyzes in [facility] but that requires some logistical issues. 2/26; not much change in the wound. Still a nonviable surface we have been using Santyl and Hydrofera Blue. I changed her to [facility] only. She tells me she has rigorously offloading this area she also complains of some pain 3/5; still no change here. I switch to pure Hydrofera Blue. She still complains of a lot of pain although is a big wound it seems out of proportion to what I might expect. This was initially a surgical wound secondary to necrotizing wound infection. She is on dialysis. Other than that she thinks she is offloading this fairly rigorously. She has not been systemically unwell 3/26; I switched to Iodoflex 3 weeks ago. This really seems to have helped, much better surface and slight improvement in the surface area. 4/26; the patient is still on Iodoflex she missed an appointment 2 weeks ago. Doing slightly smaller. She is on dialysis 5/10; using Hydrofera Blue. The major improvement here is the complete lack of depth. She has islands of epithelialization 09/24/19-Patient comes in at 2 weeks, dimensions about the same, there is an [place] of  epithelium at margin  of wound, no slough noted Objective Constitutional alert and oriented x 3. sitting or standing blood pressure is within target range for patient.. supine blood pressure is within target range for patient.. pulse regular and within target range for patient.Marland Kitchen respirations regular, non-labored and within target range for patient.Marland Kitchen temperature within target range for patient.. Well- nourished and well-hydrated in no acute distress. Vitals Time Taken: 12:46 PM, Height: 65 in, Weight: 130 lbs, BMI: 21.6, Temperature: 98.2 F, Pulse: 89 bpm, Respiratory Rate: 18 breaths/min, Blood Pressure: 148/84 mmHg. General Notes: Sacral wound with same diemensions but new epithelial growth noted Integumentary (Hair, Skin) Wound #1 status is Open. Original cause of wound was Pressure Injury. The wound is located on the Sacrum. The wound measures 6cm length x 3.3cm width x 0.5cm depth; 15.551cm^2 area and 7.775cm^3 volume. There is Fat Layer (Subcutaneous Tissue) Exposed exposed. There is no tunneling or undermining noted. There is a medium amount of serosanguineous drainage noted. The wound margin is epibole. There is large (67-100%) pink granulation within the wound bed. There is a small (1-33%) amount of necrotic tissue within the wound bed including Adherent Slough. Assessment Active Problems ICD-10 Pressure ulcer of sacral region, stage 4 Disruption of external operation (surgical) wound, not elsewhere classified, subsequent encounter Necrotizing fasciitis Plan Follow-up Appointments: Return appointment in 3 weeks. Dressing Change Frequency: Wound #1 Sacrum: Change dressing every day. Skin Barriers/Peri-Wound Care: Wound #1 Sacrum: Skin Prep Wound Cleansing: Wound #1 Sacrum: Clean wound with Wound Cleanser - or normal saline Primary Wound Dressing: Wound #1 Sacrum: Hydrofera Blue - Classic Secondary Dressing: Wound #1 Sacrum: Foam Border - or ABD Off-Loading: Turn and  reposition every 2 hours 1. Continue Hydrofera blue, with every other day changes 2. return in 3 weeks Electronic Signature(s) Signed: 09/24/2019 1:08:00 PM By: Tobi Bastos MD, MBA Entered By: Tobi Bastos on 09/24/2019 13:08:00 -------------------------------------------------------------------------------- SuperBill Details Patient Name: Date of Service: [name] Barnett, [name] Barnett 09/24/2019 Medical Record Number: [id number] Patient Account Number: [id number] Date of Birth/Sex: Treating RN: [birth year] (39 y.o. [name] Primary Care Provider: SITA FA LWA LLA, SHA RMIN Other Clinician: Referring Provider: Treating Provider/Extender: Tobi Bastos SITA FA LWA LLA, SHA RMIN Weeks in Treatment: 14 Diagnosis Coding ICD-10 Codes Code Description L89.154 Pressure ulcer of sacral region, stage 4 T81.31XD Disruption of external operation (surgical) wound, not elsewhere classified, subsequent encounter M72.6 Necrotizing fasciitis Facility Procedures CPT4 Code: [id number] Description: 01751 - WOUND CARE VISIT-LEV 3 EST PT Modifier: Quantity: 1 Physician Procedures : CPT4 Code Description Modifier [id number] - WC PHYS LEVEL 3 - EST PT ICD-10 Diagnosis Description L89.154 Pressure ulcer of sacral region, stage 4 Quantity: 1 Electronic Signature(s) Signed: 09/24/2019 5:12:22 PM By: Levan Hurst RN, BSN Signed: 09/27/2019 4:57:23 PM By: Tobi Bastos MD, MBA Previous Signature: 09/24/2019 1:08:13 PM Version By: Tobi Bastos MD, MBA Entered By: Levan Hurst on 09/24/2019 13:25:18

## 2019-10-15 ENCOUNTER — Encounter (HOSPITAL_BASED_OUTPATIENT_CLINIC_OR_DEPARTMENT_OTHER): Payer: Medicare Other | Attending: Internal Medicine | Admitting: Physician Assistant

## 2019-10-15 ENCOUNTER — Other Ambulatory Visit: Payer: Self-pay

## 2019-10-15 DIAGNOSIS — L89154 Pressure ulcer of sacral region, stage 4: Secondary | ICD-10-CM | POA: Diagnosis not present

## 2019-10-15 DIAGNOSIS — E1122 Type 2 diabetes mellitus with diabetic chronic kidney disease: Secondary | ICD-10-CM | POA: Insufficient documentation

## 2019-10-15 DIAGNOSIS — I132 Hypertensive heart and chronic kidney disease with heart failure and with stage 5 chronic kidney disease, or end stage renal disease: Secondary | ICD-10-CM | POA: Diagnosis not present

## 2019-10-15 DIAGNOSIS — Z992 Dependence on renal dialysis: Secondary | ICD-10-CM | POA: Diagnosis not present

## 2019-10-15 DIAGNOSIS — N186 End stage renal disease: Secondary | ICD-10-CM | POA: Insufficient documentation

## 2019-10-15 DIAGNOSIS — F419 Anxiety disorder, unspecified: Secondary | ICD-10-CM | POA: Insufficient documentation

## 2019-10-15 DIAGNOSIS — X58XXXA Exposure to other specified factors, initial encounter: Secondary | ICD-10-CM | POA: Diagnosis not present

## 2019-10-15 DIAGNOSIS — E1151 Type 2 diabetes mellitus with diabetic peripheral angiopathy without gangrene: Secondary | ICD-10-CM | POA: Diagnosis present

## 2019-10-15 DIAGNOSIS — I2510 Atherosclerotic heart disease of native coronary artery without angina pectoris: Secondary | ICD-10-CM | POA: Insufficient documentation

## 2019-10-15 DIAGNOSIS — T8131XD Disruption of external operation (surgical) wound, not elsewhere classified, subsequent encounter: Secondary | ICD-10-CM | POA: Diagnosis not present

## 2019-10-15 DIAGNOSIS — E1140 Type 2 diabetes mellitus with diabetic neuropathy, unspecified: Secondary | ICD-10-CM | POA: Insufficient documentation

## 2019-10-15 DIAGNOSIS — I5032 Chronic diastolic (congestive) heart failure: Secondary | ICD-10-CM | POA: Diagnosis not present

## 2019-10-15 DIAGNOSIS — E11622 Type 2 diabetes mellitus with other skin ulcer: Secondary | ICD-10-CM | POA: Diagnosis not present

## 2019-10-15 NOTE — Progress Notes (Addendum)
[name] Barnett, [name] Barnett ([id number]) Visit Report for 10/15/2019 Chief Complaint Document Details Patient Name: Date of Service: [name] Barnett, [name] Barnett 10/15/2019 12:30 PM Medical Record Number: [id number] Patient Account Number: [id number] Date of Birth/Sex: Treating RN: [birth year] (39 y.o. [name] Barnett Primary Care Provider: SITA FA LWA LLA, SHA RMIN Other Clinician: Referring Provider: Treating Provider/Extender: Worthy Keeler SITA FA LWA LLA, SHA RMIN Weeks in Treatment: 17 Information Obtained from: Patient Electronic Signature(s) Signed: 10/15/2019 12:59:48 PM By: Worthy Keeler PA-C Entered By: Worthy Keeler on 10/15/2019 12:59:48 -------------------------------------------------------------------------------- HPI Details Patient Name: Date of Service: [name] Barnett, [name] Barnett 10/15/2019 12:30 PM Medical Record Number: [id number] Patient Account Number: [id number] Date of Birth/Sex: Treating RN: October 21, 1980 (39 y.o. F) [name] Barnett Primary Care Provider: SITA FA LWA LLA, SHA RMIN Other Clinician: Referring Provider: Treating Provider/Extender: Worthy Keeler SITA FA LWA LLA, SHA RMIN Weeks in Treatment: 17 History of Present Illness HPI Description: Admission 06/15/2019 This is a 39 year old woman who comes to [place] from [facility] skilled facility in [place]. I am able to follow a lot of her history on care everywhere in epic. The patient is a type II diabetic and has been on dialysis. 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 I am not exactly sure of the nature of the stroke at this present time. She has made a decent recovery and is now up walking with a walker. In August 2020 she was admitted to [facility] in [place] with L4-L5 discitis. Culture and sensitivity showed Klebsiella. She was discharged on 8 weeks of ceftazidime. In mid September she is readmitted to the hospital  with a pressure ulcer on her lower sacrum and necrotizing fasciitis. She required an extensive surgical debridement. CT scan of the pelvis at 1 point showed communication with the rectum. A colostomy was recommended and placed. Culture of this area showed Klebsiella and yeast. She was discharged on an extended course of meropenem and Diflucan. [facility] [place] We do not have any of these records. I think she was discharged to another nursing home but is come to [facility] skilled facility in [facility]. She was sent down here for review of the remaining wound. By review of the pictures that the patient had from September this is made considerable improvement. She tells me that the wound VAC was discontinued at the end of December 2020. Currently the facility is using a form of silver alginate to the wound surface. Past medical history type 2 diabetes with chronic renal failure on dialysis Tuesday Thursday and Saturday. CVA with left hemiparesis in June 2020 but she appears to be making a good recovery. Chronic diastolic heart failure, hypertension 2/19; patient's wound area slightly smaller. The area that was towards her anus has closed over. We are using Santyl covered with Hydrofera Blue. We have not been able to get her lab work apparently her veins are very difficult for phlebotomy. They are trying to get the blood work done where she dialyzes in [place] but that requires some logistical issues. 2/26; not much change in the wound. Still a nonviable surface we have been using Santyl and Hydrofera Blue. I changed her to [facility] only. She tells me she has rigorously offloading this area she also complains of some pain 3/5; still no change here. I switch to pure Hydrofera Blue. She still complains of a  lot of pain although is a big wound it seems out of proportion to what I might expect. This was initially a surgical wound  secondary to necrotizing wound infection. She is on dialysis. Other than that she thinks she is offloading this fairly rigorously. She has not been systemically unwell 3/26; I switched to Iodoflex 3 weeks ago. This really seems to have helped, much better surface and slight improvement in the surface area. 4/26; the patient is still on Iodoflex she missed an appointment 2 weeks ago. Doing slightly smaller. She is on dialysis 5/10; using Hydrofera Blue. The major improvement here is the complete lack of depth. She has islands of epithelialization 09/24/19-Patient comes in at 2 weeks, dimensions about the same, there is an [place] of epithelium at margin of wound, no slough noted 10/15/2019 upon evaluation today patient appears to be doing well with regard to her wound. She has been tolerating the dressing changes in the sacral region without complication and at this point she seems to be healing quite nicely. I am extremely pleased with how things are progressing and the epithelial tissue seems to be spreading from the central portion as well which is also excellent. Electronic Signature(s) Signed: 10/15/2019 1:15:00 PM By: Worthy Keeler PA-C Entered By: Worthy Keeler on 10/15/2019 13:14:59 -------------------------------------------------------------------------------- Physical Exam Details Patient Name: Date of Service: [name] Barnett, [name] Barnett 10/15/2019 12:30 PM Medical Record Number: [id number] Patient Account Number: [id number] Date of Birth/Sex: Treating RN: [birth year] (39 y.o. F) [name] Barnett Primary Care Provider: SITA FA LWA LLA, SHA RMIN Other Clinician: Referring Provider: Treating Provider/Extender: Worthy Keeler SITA FA LWA LLA, SHA RMIN Weeks in Treatment: 69 Constitutional Well-nourished and well-hydrated in no acute distress. Respiratory normal breathing without difficulty. Psychiatric this patient is able to make decisions and demonstrates good insight into disease  process. Alert and Oriented x 3. pleasant and cooperative. Notes Patient's wound bed actually showed signs of excellent epithelization and granulation there was no significant slough buildup needed to be debrided today I mechanically debrided some away with saline gauze with good result. Post debridement wound bed appears to be doing much better. Electronic Signature(s) Signed: 10/15/2019 1:15:19 PM By: Worthy Keeler PA-C Entered By: Worthy Keeler on 10/15/2019 13:15:19 -------------------------------------------------------------------------------- Physician Orders Details Patient Name: Date of Service: [name] Barnett, [name] Barnett 10/15/2019 12:30 PM Medical Record Number: [id number] Patient Account Number: [id number] Date of Birth/Sex: Treating RN: [birth year] (39 y.o. F) [name] Barnett Primary Care Provider: SITA FA LWA LLA, SHA RMIN Other Clinician: Referring Provider: Treating Provider/Extender: Worthy Keeler SITA FA LWA LLA, SHA RMIN Weeks in Treatment: 46 Verbal / Phone Orders: No Diagnosis Coding ICD-10 Coding Code Description L89.154 Pressure ulcer of sacral region, stage 4 T81.31XD Disruption of external operation (surgical) wound, not elsewhere classified, subsequent encounter M72.6 Necrotizing fasciitis Follow-up Appointments Return appointment in 3 weeks. Dressing Change Frequency Wound #1 Sacrum Change dressing every day. Skin Barriers/Peri-Wound Care Wound #1 Sacrum Skin Prep Wound Cleansing Wound #1 Sacrum Clean wound with Wound Cleanser - or normal saline Primary Wound Dressing Wound #1 Sacrum Hydrofera Blue - Classic Secondary Dressing Wound #1 Sacrum Foam Border - or ABD Off-Loading Turn and reposition every 2 hours Electronic Signature(s) Signed: 10/15/2019 5:38:02 PM By: [name] Gouty RN, BSN Signed: 10/15/2019 5:53:30 PM By: Worthy Keeler PA-C Entered By: [name] Barnett on 10/15/2019  13:12:40 -------------------------------------------------------------------------------- Problem List Details Patient Name: Date of Service: [name] Barnett, [name] Barnett 10/15/2019 12:30 PM Medical Record Number: [id number] Patient Account Number:  [id number] Date of Birth/Sex: Treating RN: 31-Mar-1981 (39 y.o. F) [name] Barnett Primary Care Provider: SITA FA LWA LLA, SHA RMIN Other Clinician: Referring Provider: Treating Provider/Extender: Worthy Keeler SITA FA LWA LLA, SHA RMIN Weeks in Treatment: 17 Active Problems ICD-10 Encounter Code Description Active Date MDM Diagnosis L89.154 Pressure ulcer of sacral region, stage 4 06/15/2019 No Yes T81.31XD Disruption of external operation (surgical) wound, not elsewhere classified, 06/15/2019 No Yes subsequent encounter M72.6 Necrotizing fasciitis 06/15/2019 No Yes Inactive Problems ICD-10 Code Description Active Date Inactive Date L89.152 Pressure ulcer of sacral region, stage 2 06/15/2019 06/15/2019 Resolved Problems Electronic Signature(s) Signed: 10/15/2019 12:59:36 PM By: Worthy Keeler PA-C Entered By: Worthy Keeler on 10/15/2019 12:59:35 -------------------------------------------------------------------------------- Progress Note Details Patient Name: Date of Service: [name] Barnett, [name] Barnett 10/15/2019 12:30 PM Medical Record Number: [id number] Patient Account Number: [id number] Date of Birth/Sex: Treating RN: [birth year] (39 y.o. F) [name] Barnett Primary Care Provider: SITA FA LWA LLA, SHA RMIN Other Clinician: Referring Provider: Treating Provider/Extender: Worthy Keeler SITA FA LWA LLA, SHA RMIN Weeks in Treatment: 17 Subjective Chief Complaint Information obtained from Patient History of Present Illness (HPI) Admission 06/15/2019 This is a 39 year old woman who comes to [place] from [facility] skilled facility in [place]. I am able to follow a lot of her history on care everywhere in epic. The patient is a  type II diabetic and has been on dialysis. 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 I am not exactly sure of the nature of the stroke at this present time. She has made a decent recovery and is now up walking with a walker. In August 2020 she was admitted to [facility] in [place] with L4-L5 discitis. Culture and sensitivity showed Klebsiella. She was discharged on 8 weeks of ceftazidime. In mid September she is readmitted to the hospital with a pressure ulcer on her lower sacrum and necrotizing fasciitis. She required an extensive surgical debridement. CT scan of the pelvis at 1 point showed communication with the rectum. A colostomy was recommended and placed. Culture of this area showed Klebsiella and yeast. She was discharged on an extended course of meropenem and Diflucan. [facility] [place] We do not have any of these records. I think she was discharged to another nursing home but is come to [facility] skilled facility in [facility]. She was sent down here for review of the remaining wound. By review of the pictures that the patient had from September this is made considerable improvement. She tells me that the wound VAC was discontinued at the end of December 2020. Currently the facility is using a form of silver alginate to the wound surface. Past medical history type 2 diabetes with chronic renal failure on dialysis Tuesday Thursday and Saturday. CVA with left hemiparesis in June 2020 but she appears to be making a good recovery. Chronic diastolic heart failure, hypertension 2/19; patient's wound area slightly smaller. The area that was towards her anus has closed over. We are using Santyl covered with Hydrofera Blue. We have not been able to get her lab work apparently her veins are very difficult for phlebotomy. They are trying to get the blood work done  where she dialyzes in [facility] but that requires some logistical issues. 2/26; not much change in the wound. Still a nonviable surface we have been using Santyl and Hydrofera Blue. I changed  her to [facility] only. She tells me she has rigorously offloading this area she also complains of some pain 3/5; still no change here. I switch to pure Hydrofera Blue. She still complains of a lot of pain although is a big wound it seems out of proportion to what I might expect. This was initially a surgical wound secondary to necrotizing wound infection. She is on dialysis. Other than that she thinks she is offloading this fairly rigorously. She has not been systemically unwell 3/26; I switched to Iodoflex 3 weeks ago. This really seems to have helped, much better surface and slight improvement in the surface area. 4/26; the patient is still on Iodoflex she missed an appointment 2 weeks ago. Doing slightly smaller. She is on dialysis 5/10; using Hydrofera Blue. The major improvement here is the complete lack of depth. She has islands of epithelialization 09/24/19-Patient comes in at 2 weeks, dimensions about the same, there is an [place] of epithelium at margin of wound, no slough noted 10/15/2019 upon evaluation today patient appears to be doing well with regard to her wound. She has been tolerating the dressing changes in the sacral region without complication and at this point she seems to be healing quite nicely. I am extremely pleased with how things are progressing and the epithelial tissue seems to be spreading from the central portion as well which is also excellent. Objective Constitutional Well-nourished and well-hydrated in no acute distress. Vitals Time Taken: 12:35 PM, Height: 65 in, Weight: 130 lbs, BMI: 21.6, Temperature: 97.9 F, Pulse: 93 bpm, Respiratory Rate: 18 breaths/min, Blood Pressure: 157/99 mmHg. Respiratory normal breathing without difficulty. Psychiatric this  patient is able to make decisions and demonstrates good insight into disease process. Alert and Oriented x 3. pleasant and cooperative. General Notes: Patient's wound bed actually showed signs of excellent epithelization and granulation there was no significant slough buildup needed to be debrided today I mechanically debrided some away with saline gauze with good result. Post debridement wound bed appears to be doing much better. Integumentary (Hair, Skin) Wound #1 status is Open. Original cause of wound was Pressure Injury. The wound is located on the Sacrum. The wound measures 5.5cm length x 2.3cm width x 0.9cm depth; 9.935cm^2 area and 8.942cm^3 volume. There is Fat Layer (Subcutaneous Tissue) Exposed exposed. There is no tunneling or undermining noted. There is a medium amount of serosanguineous drainage noted. The wound margin is thickened. There is large (67-100%) pink granulation within the wound bed. There is a small (1-33%) amount of necrotic tissue within the wound bed including Adherent Slough. Assessment Active Problems ICD-10 Pressure ulcer of sacral region, stage 4 Disruption of external operation (surgical) wound, not elsewhere classified, subsequent encounter Necrotizing fasciitis Plan Follow-up Appointments: Return appointment in 3 weeks. Dressing Change Frequency: Wound #1 Sacrum: Change dressing every day. Skin Barriers/Peri-Wound Care: Wound #1 Sacrum: Skin Prep Wound Cleansing: Wound #1 Sacrum: Clean wound with Wound Cleanser - or normal saline Primary Wound Dressing: Wound #1 Sacrum: Hydrofera Blue - Classic Secondary Dressing: Wound #1 Sacrum: Foam Border - or ABD Off-Loading: Turn and reposition every 2 hours 1. I would recommend currently that we going to continue with the current wound care measures with Hydrofera Blue. 2. I am also going to recommend at this time that we go ahead and continue with the bordered foam dressing to cover. 3. I would also  recommend she continue with appropriate offloading and that I think is being done based on what I am seeing today.  We will see patient back for reevaluation in 1 week here in the clinic. If anything worsens or changes patient will contact our office for additional recommendations. Electronic Signature(s) Signed: 10/15/2019 1:15:53 PM By: Worthy Keeler PA-C Entered By: Worthy Keeler on 10/15/2019 13:15:52 -------------------------------------------------------------------------------- SuperBill Details Patient Name: Date of Service: [name] Barnett, [name] Barnett 10/15/2019 Medical Record Number: [id number] Patient Account Number: [id number] Date of Birth/Sex: Treating RN: January 31, 1981 (39 y.o. F) [name] Barnett Primary Care Provider: SITA FA LWA LLA, SHA RMIN Other Clinician: Referring Provider: Treating Provider/Extender: Worthy Keeler SITA FA LWA LLA, SHA RMIN Weeks in Treatment: 17 Diagnosis Coding ICD-10 Codes Code Description L89.154 Pressure ulcer of sacral region, stage 4 T81.31XD Disruption of external operation (surgical) wound, not elsewhere classified, subsequent encounter M72.6 Necrotizing fasciitis Facility Procedures CPT4 Code: [id number] Description: 99213 - WOUND CARE VISIT-LEV 3 EST PT Modifier: Quantity: 1 Physician Procedures : CPT4 Code Description Modifier [id number] - WC PHYS LEVEL 3 - EST PT ICD-10 Diagnosis Description L89.154 Pressure ulcer of sacral region, stage 4 T81.31XD Disruption of external operation (surgical) wound, not elsewhere classified, subsequent  encounter M72.6 Necrotizing fasciitis Quantity: 1 Electronic Signature(s) Signed: 10/15/2019 5:38:02 PM By: [name] Gouty RN, BSN Signed: 10/15/2019 5:53:30 PM By: Worthy Keeler PA-C Previous Signature: 10/15/2019 1:16:09 PM Version By: Worthy Keeler PA-C Entered By: [name] Barnett on 10/15/2019 13:16:59

## 2019-10-15 NOTE — Progress Notes (Signed)
[name] TERRAN, HOLLENKAMP ([id number]) Visit Report for 10/15/2019 Arrival Information Details Patient Name: Date of Service: [name] Barnett, [name] Barnett 10/15/2019 12:30 PM Medical Record Number: [id number] Patient Account Number: [id number] Date of Birth/Sex: Treating RN: 1980-12-04 (39 y.o. F) Dwiggins, [facility] Primary Care Ralene Gasparyan: SITA FA LWA LLA, SHA RMIN Other Clinician: Referring Anicia Leuthold: Treating Elexia Friedt/Extender: Worthy Keeler SITA FA LWA LLA, SHA RMIN Weeks in Treatment: 17 Visit Information History Since Last Visit Added or deleted any medications: No Patient Arrived: Wheel Chair Any new allergies or adverse reactions: No Arrival Time: 12:38 Had a fall or experienced change in No Accompanied By: transportation from activities of daily living that may affect facity risk of falls: Transfer Assistance: EasyPivot Patient Lift Signs or symptoms of abuse/neglect since last visito No Patient Identification Verified: Yes Hospitalized since last visit: No Secondary Verification Process Completed: Yes Implantable device outside of the clinic excluding No Patient Requires Transmission-Based No cellular tissue based products placed in the center Precautions: since last visit: Patient Has Alerts: No Has Dressing in Place as Prescribed: Yes Pain Present Now: Yes Electronic Signature(s) Signed: 10/15/2019 5:37:40 PM By: Kela Millin Entered By: Kela Millin on 10/15/2019 12:39:26 -------------------------------------------------------------------------------- Clinic Level of Care Assessment Details Patient Name: Date of Service: [name] PEGGE, CUMBERLEDGE 10/15/2019 12:30 PM Medical Record Number: [id number] Patient Account Number: [id number] Date of Birth/Sex: Treating RN: [birth year] (39 y.o. F) [name] Primary Care Bradlee Bridgers: SITA FA LWA LLA, SHA RMIN Other Clinician: Referring Joylyn Duggin: Treating Tikita Mabee/Extender: Worthy Keeler SITA FA LWA LLA, SHA RMIN Weeks in  Treatment: 17 Clinic Level of Care Assessment Items TOOL 4 Quantity Score _0  - 0 Use when only an EandM is performed on FOLLOW-UP visit ASSESSMENTS - Nursing Assessment / Reassessment X- 1 10 Reassessment of Co-morbidities (includes updates in patient status) X- 1 5 Reassessment of Adherence to Treatment Plan ASSESSMENTS - Wound and Skin A ssessment / Reassessment X - Simple Wound Assessment / Reassessment - one wound 1 5 _1  - 0 Complex Wound Assessment / Reassessment - multiple wounds _2  - 0 Dermatologic / Skin Assessment (not related to wound area) ASSESSMENTS - Focused Assessment _3  - 0 Circumferential Edema Measurements - multi extremities _4  - 0 Nutritional Assessment / Counseling / Intervention _5  - 0 Lower Extremity Assessment (monofilament, tuning fork, pulses) _6  - 0 Peripheral Arterial Disease Assessment (using hand held doppler) ASSESSMENTS - Ostomy and/or Continence Assessment and Care _7  - 0 Incontinence Assessment and Management _8  - 0 Ostomy Care Assessment and Management (repouching, etc.) PROCESS - Coordination of Care X - Simple Patient / Family Education for ongoing care 1 15 _9  - 0 Complex (extensive) Patient / Family Education for ongoing care X- 1 10 Staff obtains Programmer, systems, Records, T Results / Process Orders est X- 1 10 Staff telephones HHA, Nursing Homes / Clarify orders / etc _10  - 0 Routine Transfer to another Facility (non-emergent condition) _11  - 0 Routine Hospital Admission (non-emergent condition) _12  - 0 New Admissions / Biomedical engineer / Ordering NPWT Apligraf, etc. , _13  - 0 Emergency Hospital Admission (emergent condition) X- 1 10 Simple Discharge Coordination _14  - 0 Complex (extensive) Discharge Coordination PROCESS - Special Needs _15  - 0 Pediatric / Minor Patient Management _16  - 0 Isolation Patient Management _17  - 0 Hearing / Language / Visual special needs _18  - 0 Assessment of Community assistance (transportation,  D/C planning, etc.) _19  - 0 Additional assistance / Altered mentation _20  - 0 Support Surface(s) Assessment (bed, cushion, seat, etc.) INTERVENTIONS - Wound Cleansing /  Measurement _0  - 0 Simple Wound Cleansing - one wound X- 1 5 Complex Wound Cleansing - multiple wounds _1  - 0 Wound Imaging (photographs - any number of wounds) X- 1 5 Wound Tracing (instead of photographs) _2  - 0 Simple Wound Measurement - one wound X- 1 5 Complex Wound Measurement - multiple wounds INTERVENTIONS - Wound Dressings X - Small Wound Dressing one or multiple wounds 1 10 _3  - 0 Medium Wound Dressing one or multiple wounds _4  - 0 Large Wound Dressing one or multiple wounds X- 1 5 Application of Medications - topical <[id number]>_3<\/[id number]>_2  - 0 Application of Medications - injection INTERVENTIONS - Miscellaneous _6  - 0 External ear exam _7  - 0 Specimen Collection (cultures, biopsies, blood, body fluids, etc.) _8  - 0 Specimen(s) / Culture(s) sent or taken to Lab for analysis _9  - 0 Patient Transfer (multiple staff / Civil Service fast streamer / Similar devices) _10  - 0 Simple Staple / Suture removal (25 or less) _11  - 0 Complex Staple / Suture removal (26 or more) _12  - 0 Hypo / Hyperglycemic Management (close monitor of Blood Glucose) _13  - 0 Ankle / Brachial Index (ABI) - do not check if billed separately X- 1 5 Vital Signs Has the patient been seen at the hospital within the last three years: Yes Total Score: 100 Level Of Care: New/Established - Level 3 Electronic Signature(s) Signed: 10/15/2019 5:38:02 PM By: [name] RN, BSN Entered By: [name] on 10/15/2019 13:13:32 -------------------------------------------------------------------------------- Encounter Discharge Information Details Patient Name: Date of Service: [name] DA [place], [place] 10/15/2019 12:30 PM Medical Record Number: [id number] Patient Account Number: [id number] Date of Birth/Sex: Treating RN: 1981/03/08 (38 y.o. F) Dwiggins, [facility] Primary  Care Lyndal Alamillo: SITA FA LWA LLA, SHA RMIN Other Clinician: Referring Brendon Christoffel: Treating Chai Verdejo/Extender: Worthy Keeler SITA FA LWA LLA, SHA RMIN Weeks in Treatment: 17 Encounter Discharge Information Items Discharge Condition: Stable Ambulatory Status: Wheelchair Discharge Destination: [place] Telephoned: No Orders Sent: Yes Transportation: Other Accompanied By: transporter from facility Schedule Follow-up Appointment: Yes Clinical Summary of Care: Patient Declined Electronic Signature(s) Signed: 10/15/2019 5:37:40 PM By: Kela Millin Entered By: Kela Millin on 10/15/2019 13:29:51 -------------------------------------------------------------------------------- Lower Extremity Assessment Details Patient Name: Date of Service: [name] DA DAMESHIA, SEYBOLD 10/15/2019 12:30 PM Medical Record Number: [id number] Patient Account Number: [id number] Date of Birth/Sex: Treating RN: 22-Dec-1980 (39 y.o. F) Dwiggins, [facility] Primary Care Bear Osten: SITA FA LWA LLA, SHA RMIN Other Clinician: Referring Tarence Searcy: Treating Keia Rask/Extender: Worthy Keeler SITA FA LWA LLA, SHA RMIN Weeks in Treatment: 17 Electronic Signature(s) Signed: 10/15/2019 5:37:40 PM By: Kela Millin Entered By: Kela Millin on 10/15/2019 12:40:35 -------------------------------------------------------------------------------- Multi-Disciplinary Care Plan Details Patient Name: Date of Service: [name] DA [place], [place] 10/15/2019 12:30 PM Medical Record Number: [id number] Patient Account Number: [id number] Date of Birth/Sex: Treating RN: November 30, 1980 (39 y.o. F) [name] Primary Care Margrett Kalb: SITA FA LWA LLA, SHA RMIN Other Clinician: Referring Felina Tello: Treating Laiden Milles/Extender: Worthy Keeler SITA FA LWA LLA, SHA RMIN Weeks in Treatment: 17 Active Inactive Wound/Skin Impairment Nursing Diagnoses: Impaired tissue integrity Goals: Patient/caregiver will verbalize  understanding of skin care regimen Date Initiated: 08/27/2019 Target Resolution Date: 10/26/2019 Goal Status: Active Ulcer/skin breakdown will have a volume reduction of 50% by week 8 Date Initiated: 06/15/2019 Date Inactivated: 08/27/2019 Target Resolution Date: 08/03/2019 Goal Status: Met Interventions: Assess patient/caregiver ability to obtain necessary supplies Assess patient/caregiver ability to perform ulcer/skin care regimen upon admission and as needed Provide education on ulcer and skin care Notes: Electronic Signature(s) Signed: 10/15/2019 5:38:02 PM  By: [name] RN, BSN Entered By: [name] on 10/15/2019 12:40:52 -------------------------------------------------------------------------------- Pain Assessment Details Patient Name: Date of Service: [name] DA CORIAN[place], AVALLONE 10/15/2019 12:30 PM Medical Record Number: [id number] Patient Account Number: [id number] Date of Birth/Sex: Treating RN: [birth year] (39 y.o. F) Dwiggins, [facility] Primary Care Rahsaan Weakland: SITA FA LWA LLA, SHA RMIN Other Clinician: Referring Laquashia Mergenthaler: Treating Berdine Rasmusson/Extender: Worthy Keeler SITA FA LWA LLA, SHA RMIN Weeks in Treatment: 17 Active Problems Location of Pain Severity and Description of Pain Patient Has Paino Yes Site Locations Pain Location: Pain Location: Pain in Ulcers With Dressing Change: Yes Duration of the Pain. Constant / Intermittento Constant Rate the pain. Current Pain Level: 7 Worst Pain Level: 10 Least Pain Level: 5 Tolerable Pain Level: 5 Character of Pain Describe the Pain: Aching Pain Management and Medication Current Pain Management: Electronic Signature(s) Signed: 10/15/2019 5:37:40 PM By: Kela Millin Entered By: Kela Millin on 10/15/2019 12:40:27 -------------------------------------------------------------------------------- Patient/Caregiver Education Details Patient Name: Date of Service: [name] DA Kirby Crigler 6/14/2021andnbsp12:30  PM Medical Record Number: [id number] Patient Account Number: [id number] Date of Birth/Gender: Treating RN: October 11, 1980 (39 y.o. F) [name] Primary Care Physician: SITA FA LWA LLA, SHA RMIN Other Clinician: Referring Physician: Treating Physician/Extender: Worthy Keeler SITA FA LWA LLA, SHA RMIN Weeks in Treatment: 17 Education Assessment Education Provided To: Patient Education Topics Provided Pressure: Methods: Explain/Verbal Responses: Reinforcements needed, State content correctly Wound/Skin Impairment: Methods: Explain/Verbal Responses: Reinforcements needed, State content correctly Electronic Signature(s) Signed: 10/15/2019 5:38:02 PM By: [name] RN, BSN Entered By: [name] on 10/15/2019 12:41:16 -------------------------------------------------------------------------------- Wound Assessment Details Patient Name: Date of Service: [name] DA Stefano Gaul, [place] 10/15/2019 12:30 PM Medical Record Number: [id number] Patient Account Number: [id number] Date of Birth/Sex: Treating RN: 1980/09/02 (39 y.o. F) Dwiggins, [facility] Primary Care Noela Brothers: SITA FA LWA LLA, SHA RMIN Other Clinician: Referring Phoenyx Melka: Treating Barbera Perritt/Extender: Worthy Keeler SITA FA LWA LLA, SHA RMIN Weeks in Treatment: 17 Wound Status Wound Number: 1 Primary Pressure Ulcer Etiology: Wound Location: Sacrum Secondary Necrotizing Infection Wounding Event: Pressure Injury Etiology: Date Acquired: 01/11/2019 Wound Open Weeks Of Treatment: 17 Status: Clustered Wound: No Comorbid Cataracts, Glaucoma, Anemia, Congestive Heart Failure, History: Coronary Artery Disease, Hypertension, Type II Diabetes, End Stage Renal Disease, Neuropathy, Confinement Anxiety Wound Measurements Length: (cm) 5.5 Width: (cm) 2.3 Depth: (cm) 0.9 Area: (cm) 9.935 Volume: (cm) 8.942 % Reduction in Area: 73.6% % Reduction in Volume: 70.4% Epithelialization: Small (1-33%) Tunneling:  No Undermining: No Wound Description Classification: Category/Stage IV Wound Margin: Thickened Exudate Amount: Medium Exudate Type: Serosanguineous Exudate Color: red, brown Foul Odor After Cleansing: No Slough/Fibrino Yes Wound Bed Granulation Amount: Large (67-100%) Exposed Structure Granulation Quality: Pink Fascia Exposed: No Necrotic Amount: Small (1-33%) Fat Layer (Subcutaneous Tissue) Exposed: Yes Necrotic Quality: Adherent Slough Tendon Exposed: No Muscle Exposed: No Joint Exposed: No Bone Exposed: No Treatment Notes Wound #1 (Sacrum) 1. Cleanse With Wound Cleanser 2. Periwound Care Skin Prep 3. Primary Dressing Applied Hydrofera Blue 4. Secondary Dressing Foam Border Dressing Electronic Signature(s) Signed: 10/15/2019 5:37:40 PM By: Kela Millin Entered By: Kela Millin on 10/15/2019 12:47:42 -------------------------------------------------------------------------------- Vitals Details Patient Name: Date of Service: [name] DA [place], [place] 10/15/2019 12:30 PM Medical Record Number: [id number] Patient Account Number: [id number] Date of Birth/Sex: Treating RN: March 27, 1981 (39 y.o. F) Dwiggins, [facility] Primary Care Catherine Oak: SITA FA LWA LLA, SHA RMIN Other Clinician: Referring Katalea Ucci: Treating Sohaib Vereen/Extender: Worthy Keeler SITA FA LWA LLA, SHA RMIN Weeks in Treatment: 17 Vital Signs Time Taken: 12:35 Temperature (F): 97.9 Height (  in): 65 Pulse (bpm): 93 Weight (lbs): 130 Respiratory Rate (breaths/min): 18 Body Mass Index (BMI): 21.6 Blood Pressure (mmHg): 157/99 Reference Range: 80 - 120 mg / dl Electronic Signature(s) Signed: 10/15/2019 5:37:40 PM By: Kela Millin Entered By: Kela Millin on 10/15/2019 12:40:02

## 2019-11-12 ENCOUNTER — Encounter (HOSPITAL_BASED_OUTPATIENT_CLINIC_OR_DEPARTMENT_OTHER): Payer: Medicare Other | Attending: Internal Medicine | Admitting: Internal Medicine

## 2019-11-12 ENCOUNTER — Other Ambulatory Visit: Payer: Self-pay

## 2019-11-12 DIAGNOSIS — T8131XD Disruption of external operation (surgical) wound, not elsewhere classified, subsequent encounter: Secondary | ICD-10-CM | POA: Insufficient documentation

## 2019-11-12 DIAGNOSIS — L89154 Pressure ulcer of sacral region, stage 4: Secondary | ICD-10-CM | POA: Insufficient documentation

## 2019-11-12 DIAGNOSIS — I130 Hypertensive heart and chronic kidney disease with heart failure and stage 1 through stage 4 chronic kidney disease, or unspecified chronic kidney disease: Secondary | ICD-10-CM | POA: Diagnosis not present

## 2019-11-12 DIAGNOSIS — M726 Necrotizing fasciitis: Secondary | ICD-10-CM | POA: Insufficient documentation

## 2019-11-12 DIAGNOSIS — E1136 Type 2 diabetes mellitus with diabetic cataract: Secondary | ICD-10-CM | POA: Diagnosis not present

## 2019-11-12 DIAGNOSIS — E1140 Type 2 diabetes mellitus with diabetic neuropathy, unspecified: Secondary | ICD-10-CM | POA: Diagnosis not present

## 2019-11-12 DIAGNOSIS — N186 End stage renal disease: Secondary | ICD-10-CM | POA: Diagnosis not present

## 2019-11-12 DIAGNOSIS — E1122 Type 2 diabetes mellitus with diabetic chronic kidney disease: Secondary | ICD-10-CM | POA: Diagnosis not present

## 2019-11-12 DIAGNOSIS — I5032 Chronic diastolic (congestive) heart failure: Secondary | ICD-10-CM | POA: Diagnosis not present

## 2019-11-12 DIAGNOSIS — Z992 Dependence on renal dialysis: Secondary | ICD-10-CM | POA: Diagnosis not present

## 2019-11-13 NOTE — Progress Notes (Addendum)
[name] ERILYN, PEARMAN ([id number]) Visit Report for 11/12/2019 Arrival Information Details Patient Name: Date of Service: [name] Barnett, [name] Barnett 11/12/2019 12:30 PM Medical Record Number: [id number] Patient Account Number: [id number] Date of Birth/Sex: Treating RN: [birth year] (39 y.o. F) Dwiggins, [facility] Primary Care Ines Rebel: SITA FA LWA LLA, SHA RMIN Other Clinician: Referring Tashawn Greff: Treating Danette Weinfeld/Extender: Linton Ham SITA FA LWA LLA, SHA RMIN Weeks in Treatment: 21 Visit Information History Since Last Visit Added or deleted any medications: No Patient Arrived: Wheel Chair Any new allergies or adverse reactions: No Arrival Time: 12:54 Had a fall or experienced change in No Accompanied By: caregiver activities of daily living that may affect Transfer Assistance: None risk of falls: Patient Identification Verified: Yes Signs or symptoms of abuse/neglect since last visito No Secondary Verification Process Completed: Yes Hospitalized since last visit: No Patient Requires Transmission-Based Precautions: No Implantable device outside of the clinic excluding No Patient Has Alerts: No cellular tissue based products placed in the center since last visit: Has Dressing in Place as Prescribed: Yes Pain Present Now: No Electronic Signature(s) Signed: 11/13/2019 7:38:35 AM By: Kela Millin Entered By: Kela Millin on 11/12/2019 12:55:04 -------------------------------------------------------------------------------- Clinic Level of Care Assessment Details Patient Name: Date of Service: [name] Barnett, [name] Barnett 11/12/2019 12:30 PM Medical Record Number: [id number] Patient Account Number: [id number] Date of Birth/Sex: Treating RN: 12/04/1980 (39 y.o. [name] Primary Care Sanel Stemmer: SITA FA LWA LLA, SHA RMIN Other Clinician: Referring Tamaj Jurgens: Treating Marcin Holte/Extender: Linton Ham SITA FA LWA LLA, SHA RMIN Weeks in Treatment: 21 Clinic Level of Care  Assessment Items TOOL 4 Quantity Score X- 1 0 Use when only an EandM is performed on FOLLOW-UP visit ASSESSMENTS - Nursing Assessment / Reassessment X- 1 10 Reassessment of Co-morbidities (includes updates in patient status) X- 1 5 Reassessment of Adherence to Treatment Plan ASSESSMENTS - Wound and Skin A ssessment / Reassessment X - Simple Wound Assessment / Reassessment - one wound 1 5 []  - 0 Complex Wound Assessment / Reassessment - multiple wounds []  - 0 Dermatologic / Skin Assessment (not related to wound area) ASSESSMENTS - Focused Assessment []  - 0 Circumferential Edema Measurements - multi extremities []  - 0 Nutritional Assessment / Counseling / Intervention []  - 0 Lower Extremity Assessment (monofilament, tuning fork, pulses) []  - 0 Peripheral Arterial Disease Assessment (using hand held doppler) ASSESSMENTS - Ostomy and/or Continence Assessment and Care []  - 0 Incontinence Assessment and Management []  - 0 Ostomy Care Assessment and Management (repouching, etc.) PROCESS - Coordination of Care X - Simple Patient / Family Education for ongoing care 1 15 []  - 0 Complex (extensive) Patient / Family Education for ongoing care X- 1 10 Staff obtains Programmer, systems, Records, T Results / Process Orders est []  - 0 Staff telephones HHA, Nursing Homes / Clarify orders / etc []  - 0 Routine Transfer to another Facility (non-emergent condition) []  - 0 Routine Hospital Admission (non-emergent condition) []  - 0 New Admissions / Biomedical engineer / Ordering NPWT Apligraf, etc. , []  - 0 Emergency Hospital Admission (emergent condition) X- 1 10 Simple Discharge Coordination []  - 0 Complex (extensive) Discharge Coordination PROCESS - Special Needs []  - 0 Pediatric / Minor Patient Management []  - 0 Isolation Patient Management []  - 0 Hearing / Language / Visual special needs []  - 0 Assessment of Community assistance (transportation, D/C planning, etc.) []  -  0 Additional assistance / Altered mentation []  - 0 Support Surface(s) Assessment (bed, cushion, seat, etc.) INTERVENTIONS - Wound Cleansing / Measurement X - Simple Wound  Cleansing - one wound 1 5 []  - 0 Complex Wound Cleansing - multiple wounds X- 1 5 Wound Imaging (photographs - any number of wounds) []  - 0 Wound Tracing (instead of photographs) X- 1 5 Simple Wound Measurement - one wound []  - 0 Complex Wound Measurement - multiple wounds INTERVENTIONS - Wound Dressings X - Small Wound Dressing one or multiple wounds 1 10 []  - 0 Medium Wound Dressing one or multiple wounds []  - 0 Large Wound Dressing one or multiple wounds []  - 0 Application of Medications - topical []  - 0 Application of Medications - injection INTERVENTIONS - Miscellaneous []  - 0 External ear exam []  - 0 Specimen Collection (cultures, biopsies, blood, body fluids, etc.) []  - 0 Specimen(s) / Culture(s) sent or taken to Lab for analysis []  - 0 Patient Transfer (multiple staff / Civil Service fast streamer / Similar devices) []  - 0 Simple Staple / Suture removal (25 or less) []  - 0 Complex Staple / Suture removal (26 or more) []  - 0 Hypo / Hyperglycemic Management (close monitor of Blood Glucose) []  - 0 Ankle / Brachial Index (ABI) - do not check if billed separately X- 1 5 Vital Signs Has the patient been seen at the hospital within the last three years: Yes Total Score: 85 Level Of Care: New/Established - Level 3 Electronic Signature(s) Signed: 11/12/2019 5:04:17 PM By: Levan Hurst RN, BSN Entered By: Levan Hurst on 11/12/2019 13:42:18 -------------------------------------------------------------------------------- Encounter Discharge Information Details Patient Name: Date of Service: [name] Barnett, [name] Barnett 11/12/2019 12:30 PM Medical Record Number: [id number] Patient Account Number: [id number] Date of Birth/Sex: Treating RN: 1981/04/21 (39 y.o. F) [name] Primary Care Brycin Kille: SITA FA LWA LLA,  SHA RMIN Other Clinician: Referring Dustee Bottenfield: Treating Aubrianne Molyneux/Extender: Linton Ham SITA FA LWA LLA, SHA RMIN Weeks in Treatment: 21 Encounter Discharge Information Items Discharge Condition: Stable Ambulatory Status: Wheelchair Discharge Destination: [place] Telephoned: No Orders Sent: Yes Transportation: Other Accompanied By: facility staff Schedule Follow-up Appointment: Yes Clinical Summary of Care: Patient Declined Notes facility transportation Electronic Signature(s) Signed: 11/13/2019 5:34:53 PM By: [name] RN, BSN Entered By: [name] on 11/12/2019 13:30:33 -------------------------------------------------------------------------------- Lower Extremity Assessment Details Patient Name: Date of Service: [name] Barnett, [name] Barnett 11/12/2019 12:30 PM Medical Record Number: [id number] Patient Account Number: [id number] Date of Birth/Sex: Treating RN: [birth year] (39 y.o. [name] Primary Care Daniell Paradise: SITA FA Janace Aris, SHA RMIN Other Clinician: Referring Tyneshia Stivers: Treating Maydelin Deming/Extender: Linton Ham SITA FA LWA LLA, SHA RMIN Weeks in Treatment: 21 Electronic Signature(s) Signed: 11/13/2019 7:38:35 AM By: Kela Millin Entered By: Kela Millin on 11/12/2019 12:56:02 -------------------------------------------------------------------------------- Multi Wound Chart Details Patient Name: Date of Service: [name] Barnett, [name] Barnett 11/12/2019 12:30 PM Medical Record Number: [id number] Patient Account Number: [id number] Date of Birth/Sex: Treating RN: 10/19/1980 (39 y.o. [name] Primary Care Jahbari Repinski: SITA FA LWA LLA, SHA RMIN Other Clinician: Referring Shameca Landen: Treating Zsazsa Bahena/Extender: Linton Ham SITA FA LWA LLA, SHA RMIN Weeks in Treatment: 21 Vital Signs Height(in): 65 Pulse(bpm): 92 Weight(lbs): 130 Blood Pressure(mmHg): 158/107 Body Mass Index(BMI): 22 Temperature(F): 97.8 Respiratory  Rate(breaths/min): 19 Photos: [1:No Photos Sacrum] [N/A:N/A N/A] Wound Location: [1:Pressure Injury] [N/A:N/A] Wounding Event: [1:Pressure Ulcer] [N/A:N/A] Primary Etiology: [1:Necrotizing Infection] [N/A:N/A] Secondary Etiology: [1:Cataracts, Glaucoma, Anemia,] [N/A:N/A] Comorbid History: [1:Congestive Heart Failure, Coronary Artery Disease, Hypertension, Type II Diabetes, End Stage Renal Disease, Neuropathy, Confinement Anxiety 01/11/2019] [N/A:N/A] Date Acquired: [1:21] [N/A:N/A] Weeks of Treatment: [1:Open] [N/A:N/A] Wound Status: [1:4.5x2x0.6] [N/A:N/A] Measurements L x W x D (cm) [1:7.069] [N/A:N/A] A (  cm) : rea [1:4.241] [N/A:N/A] Volume (cm) : [1:81.20%] [N/A:N/A] % Reduction in A rea: [1:85.90%] [N/A:N/A] % Reduction in Volume: [1:Category/Stage IV] [N/A:N/A] Classification: [1:Medium] [N/A:N/A] Exudate A mount: [1:Serosanguineous] [N/A:N/A] Exudate Type: [1:red, brown] [N/A:N/A] Exudate Color: [1:Thickened] [N/A:N/A] Wound Margin: [1:Large (67-100%)] [N/A:N/A] Granulation A mount: [1:Red, Pink] [N/A:N/A] Granulation Quality: [1:None Present (0%)] [N/A:N/A] Necrotic A mount: [1:Fat Layer (Subcutaneous Tissue)] [N/A:N/A] Exposed Structures: [1:Exposed: Yes Fascia: No Tendon: No Muscle: No Joint: No Bone: No Small (1-33%)] [N/A:N/A] Treatment Notes Electronic Signature(s) Signed: 11/12/2019 5:04:17 PM By: Levan Hurst RN, BSN Signed: 11/13/2019 8:12:39 AM By: Linton Ham MD Entered By: Linton Ham on 11/12/2019 13:15:03 -------------------------------------------------------------------------------- [place] Details Patient Name: Date of Service: [name] Barnett, [name] Barnett 11/12/2019 12:30 PM Medical Record Number: [id number] Patient Account Number: [id number] Date of Birth/Sex: Treating RN: 04/12/1981 (39 y.o. [name] Primary Care An Schnabel: SITA FA LWA LLA, SHA RMIN Other Clinician: Referring Wynee Matarazzo: Treating Newel Oien/Extender:  Linton Ham SITA FA LWA LLA, SHA RMIN Weeks in Treatment: 21 Active Inactive Wound/Skin Impairment Nursing Diagnoses: Impaired tissue integrity Goals: Patient/caregiver will verbalize understanding of skin care regimen Date Initiated: 08/27/2019 Target Resolution Date: 12/14/2019 Goal Status: Active Ulcer/skin breakdown will have a volume reduction of 50% by week 8 Date Initiated: 06/15/2019 Date Inactivated: 08/27/2019 Target Resolution Date: 08/03/2019 Goal Status: Met Interventions: Assess patient/caregiver ability to obtain necessary supplies Assess patient/caregiver ability to perform ulcer/skin care regimen upon admission and as needed Provide education on ulcer and skin care Notes: Electronic Signature(s) Signed: 11/12/2019 5:04:17 PM By: Levan Hurst RN, BSN Entered By: Levan Hurst on 11/12/2019 12:54:55 -------------------------------------------------------------------------------- Pain Assessment Details Patient Name: Date of Service: [name] Barnett, [name] Barnett 11/12/2019 12:30 PM Medical Record Number: [id number] Patient Account Number: [id number] Date of Birth/Sex: Treating RN: 21-May-1980 (39 y.o. F) Dwiggins, [facility] Primary Care Leyli Kevorkian: SITA FA LWA LLA, SHA RMIN Other Clinician: Referring Prynce Jacober: Treating Javiel Canepa/Extender: Linton Ham SITA FA LWA LLA, SHA RMIN Weeks in Treatment: 21 Active Problems Location of Pain Severity and Description of Pain Patient Has Paino No Site Locations Pain Management and Medication Current Pain Management: Electronic Signature(s) Signed: 11/13/2019 7:38:35 AM By: Kela Millin Entered By: Kela Millin on 11/12/2019 12:55:56 -------------------------------------------------------------------------------- Patient/Caregiver Education Details Patient Name: Date of Service: [name] DA Kirby Crigler 7/12/2021andnbsp12:30 PM Medical Record Number: [id number] Patient Account Number: [id number] Date of  Birth/Gender: Treating RN: April 22, 1981 (39 y.o. [name] Primary Care Physician: SITA FA LWA LLA, SHA RMIN Other Clinician: Referring Physician: Treating Physician/Extender: Linton Ham SITA FA LWA LLA, SHA RMIN Weeks in Treatment: 21 Education Assessment Education Provided To: Patient Education Topics Provided Wound/Skin Impairment: Methods: Explain/Verbal Responses: State content correctly Electronic Signature(s) Signed: 11/12/2019 5:04:17 PM By: Levan Hurst RN, BSN Entered By: Levan Hurst on 11/12/2019 12:55:05 -------------------------------------------------------------------------------- Wound Assessment Details Patient Name: Date of Service: [name] DA KERRILYNN, [name] Barnett 11/12/2019 12:30 PM Medical Record Number: [id number] Patient Account Number: [id number] Date of Birth/Sex: Treating RN: 1980/11/02 (39 y.o. F) Dwiggins, [facility] Primary Care Robyn Galati: SITA FA LWA LLA, SHA RMIN Other Clinician: Referring Myrlene Riera: Treating Gracelee Stemmler/Extender: Linton Ham SITA FA LWA LLA, SHA RMIN Weeks in Treatment: 21 Wound Status Wound Number: 1 Primary Pressure Ulcer Etiology: Wound Location: Sacrum Secondary Necrotizing Infection Wounding Event: Pressure Injury Etiology: Date Acquired: 01/11/2019 Wound Open Weeks Of Treatment: 21 Status: Clustered Wound: No Comorbid Cataracts, Glaucoma, Anemia, Congestive Heart Failure, Coronary History: Artery Disease, Hypertension, Type II Diabetes, End Stage Renal Disease, Neuropathy, Confinement Anxiety Photos Photo Uploaded By: Mikeal Hawthorne on 11/13/2019 14:49:28 Wound  Measurements Length: (cm) 4.5 Width: (cm) 2 Depth: (cm) 0.6 Area: (cm) 7.069 Volume: (cm) 4.241 % Reduction in Area: 81.2% % Reduction in Volume: 85.9% Epithelialization: Small (1-33%) Tunneling: No Undermining: No Wound Description Classification: Category/Stage IV Wound Margin: Thickened Exudate Amount: Medium Exudate Type:  Serosanguineous Exudate Color: red, brown Foul Odor After Cleansing: No Slough/Fibrino No Wound Bed Granulation Amount: Large (67-100%) Exposed Structure Granulation Quality: Red, Pink Fascia Exposed: No Necrotic Amount: None Present (0%) Fat Layer (Subcutaneous Tissue) Exposed: Yes Tendon Exposed: No Muscle Exposed: No Joint Exposed: No Bone Exposed: No Treatment Notes Wound #1 (Sacrum) 2. Periwound Care Skin Prep 3. Primary Dressing Applied Hydrofera Blue 4. Secondary Dressing Dry Gauze Foam Border Dressing Electronic Signature(s) Signed: 11/13/2019 7:38:35 AM By: Kela Millin Entered By: Kela Millin on 11/12/2019 13:00:21 -------------------------------------------------------------------------------- Vitals Details Patient Name: Date of Service: [name] Barnett, ZARRIAH 11/12/2019 12:30 PM Medical Record Number: [id number] Patient Account Number: [id number] Date of Birth/Sex: Treating RN: 1981-01-16 (39 y.o. F) Dwiggins, [facility] Primary Care Tiffney Haughton: SITA FA LWA LLA, SHA RMIN Other Clinician: Referring Beth Spackman: Treating Ahmya Bernick/Extender: Linton Ham SITA FA LWA LLA, SHA RMIN Weeks in Treatment: 21 Vital Signs Time Taken: 12:50 Temperature (F): 97.8 Height (in): 65 Pulse (bpm): 92 Weight (lbs): 130 Respiratory Rate (breaths/min): 19 Body Mass Index (BMI): 21.6 Blood Pressure (mmHg): 158/107 Reference Range: 80 - 120 mg / dl Electronic Signature(s) Signed: 11/13/2019 7:38:35 AM By: Kela Millin Entered By: Kela Millin on 11/12/2019 12:55:50

## 2019-11-13 NOTE — Progress Notes (Addendum)
[name] Barnett, [name] Barnett ([id number]) Visit Report for 11/12/2019 HPI Details Patient Name: Date of Service: [name] Barnett, [name] Barnett 11/12/2019 12:30 PM Medical Record Number: [id number] Patient Account Number: [id number] Date of Birth/Sex: Treating RN: 1980-10-01 (39 y.o. [name] Primary Care Provider: SITA FA LWA LLA, SHA RMIN Other Clinician: Referring Provider: Treating Provider/Extender: Linton Ham SITA FA LWA LLA, SHA RMIN Weeks in Treatment: 21 History of Present Illness HPI Description: Admission 06/15/2019 This is a 39 year old woman who comes to [place] from [facility] skilled facility in [place]. I am able to follow a lot of her history on care everywhere in epic. The patient is a type II diabetic and has been on dialysis. 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 I am not exactly sure of the nature of the stroke at this present time. She has made a decent recovery and is now up walking with a walker. In August 2020 she was admitted to [facility] in [place] with L4-L5 discitis. Culture and sensitivity showed Klebsiella. She was discharged on 8 weeks of ceftazidime. In mid September she is readmitted to the hospital with a pressure ulcer on her lower sacrum and necrotizing fasciitis. She required an extensive surgical debridement. CT scan of the pelvis at 1 point showed communication with the rectum. A colostomy was recommended and placed. Culture of this area showed Klebsiella and yeast. She was discharged on an extended course of meropenem and Diflucan. [facility] [place] We do not have any of these records. I think she was discharged to another nursing home but is come to [facility] skilled facility in [facility]. She was sent down here for review of the remaining wound. By review of the pictures that the patient had from  September this is made considerable improvement. She tells me that the wound VAC was discontinued at the end of December 2020. Currently the facility is using a form of silver alginate to the wound surface. Past medical history type 2 diabetes with chronic renal failure on dialysis Tuesday Thursday and Saturday. CVA with left hemiparesis in June 2020 but she appears to be making a good recovery. Chronic diastolic heart failure, hypertension 2/19; patient's wound area slightly smaller. The area that was towards her anus has closed over. We are using Santyl covered with Hydrofera Blue. We have not been able to get her lab work apparently her veins are very difficult for phlebotomy. They are trying to get the blood work done where she dialyzes in [facility] but that requires some logistical issues. 2/26; not much change in the wound. Still a nonviable surface we have been using Santyl and Hydrofera Blue. I changed her to [facility] only. She tells me she has rigorously offloading this area she also complains of some pain 3/5; still no change here. I switch to pure Hydrofera Blue. She still complains of a lot of pain although is a big wound it seems out of proportion to what I might expect. This was initially a surgical wound secondary to necrotizing wound infection. She is on dialysis. Other than that she thinks she is offloading this fairly rigorously. She has not been systemically unwell 3/26; I switched to Iodoflex 3 weeks ago. This really seems to have helped, much better surface and slight improvement in the surface area. 4/26; the patient is still on Iodoflex she missed an appointment 2 weeks ago.  Doing slightly smaller. She is on dialysis 5/10; using Hydrofera Blue. The major improvement here is the complete lack of depth. She has islands of epithelialization 09/24/19-Patient comes in at 2 weeks, dimensions about the same, there is an [place] of epithelium at margin of wound, no slough  noted 10/15/2019 upon evaluation today patient appears to be doing well with regard to her wound. She has been tolerating the dressing changes in the sacral region without complication and at this point she seems to be healing quite nicely. I am extremely pleased with how things are progressing and the epithelial tissue seems to be spreading from the central portion as well which is also excellent. 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 We have been using Hydrofera Blue. The smaller area which has depth at 0.7 cm there is a question about whether they have been packing anything into this or just flaring Hydrofera Blue over the top Electronic Signature(s) Signed: 11/13/2019 8:12:39 AM By: Linton Ham MD Entered By: Linton Ham on 11/12/2019 13:16:07 -------------------------------------------------------------------------------- Physical Exam Details Patient Name: Date of Service: [name] Barnett, [name] Barnett 11/12/2019 12:30 PM Medical Record Number: [id number] Patient Account Number: [id number] Date of Birth/Sex: Treating RN: 12/19/1980 (39 y.o. [name] Primary Care Provider: SITA FA LWA LLA, SHA RMIN Other Clinician: Referring Provider: Treating Provider/Extender: Linton Ham SITA FA LWA LLA, SHA RMIN Weeks in Treatment: 21 Psychiatric Anxious patient but no overt changes from usual. Notes Wound exam; most of this is healed she has a superficial area with heaped clean granulation around a about 20% of the circumference. A small deeper area inferiorly at 0.7 cm in depth. This does not go to bone surface appears clean no evidence of surrounding infection however the healing is left skin over bone in this area Electronic Signature(s) Signed: 11/13/2019 8:12:39 AM By: Linton Ham MD Entered By: Linton Ham on 11/12/2019  13:17:14 -------------------------------------------------------------------------------- Physician Orders Details Patient Name: Date of Service: [name] Barnett, [name] Barnett 11/12/2019 12:30 PM Medical Record Number: [id number] Patient Account Number: [id number] Date of Birth/Sex: Treating RN: 28-Jul-1980 (39 y.o. [name] Primary Care Provider: SITA FA LWA LLA, SHA RMIN Other Clinician: Referring Provider: Treating Provider/Extender: Linton Ham SITA FA LWA LLA, SHA RMIN Weeks in Treatment: 21 Verbal / Phone Orders: No Diagnosis Coding ICD-10 Coding Code Description L89.154 Pressure ulcer of sacral region, stage 4 T81.31XD Disruption of external operation (surgical) wound, not elsewhere classified, subsequent encounter M72.6 Necrotizing fasciitis Follow-up Appointments Return appointment in 1 month. Dressing Change Frequency Wound #1 Sacrum Change dressing every day. Skin Barriers/Peri-Wound Care Wound #1 Sacrum Skin Prep Wound Cleansing Wound #1 Sacrum Clean wound with Wound Cleanser - or normal saline Primary Wound Dressing Wound #1 Sacrum Hydrofera Blue - Classic - make sure to lightly pack into open area medially Secondary Dressing Wound #1 Sacrum Foam Border - or ABD Off-Loading Turn and reposition every 2 hours Electronic Signature(s) Signed: 11/12/2019 5:04:17 PM By: Levan Hurst RN, BSN Signed: 11/13/2019 8:12:39 AM By: Linton Ham MD Entered By: Levan Hurst on 11/12/2019 13:12:44 -------------------------------------------------------------------------------- Problem List Details Patient Name: Date of Service: [name] Barnett, [name] Barnett 11/12/2019 12:30 PM Medical Record Number: [id number] Patient Account Number: [id number] Date of Birth/Sex: Treating RN: [birth year] (39 y.o. [name] Primary Care Provider: SITA FA LWA LLA, SHA RMIN Other Clinician: Referring Provider: Treating Provider/Extender: Linton Ham SITA FA LWA LLA, SHA  RMIN Weeks in Treatment: 21 Active Problems ICD-10 Encounter  Code Description Active Date MDM Diagnosis L89.154 Pressure ulcer of sacral region, stage 4 06/15/2019 No Yes T81.31XD Disruption of external operation (surgical) wound, not elsewhere classified, 06/15/2019 No Yes subsequent encounter M72.6 Necrotizing fasciitis 06/15/2019 No Yes Inactive Problems ICD-10 Code Description Active Date Inactive Date L89.152 Pressure ulcer of sacral region, stage 2 06/15/2019 06/15/2019 Resolved Problems Electronic Signature(s) Signed: 11/13/2019 8:12:39 AM By: Linton Ham MD Entered By: Linton Ham on 11/12/2019 13:14:56 -------------------------------------------------------------------------------- Progress Note Details Patient Name: Date of Service: [name] Barnett, [name] Barnett 11/12/2019 12:30 PM Medical Record Number: [id number] Patient Account Number: [id number] Date of Birth/Sex: Treating RN: 1980/08/19 (39 y.o. [name] Primary Care Provider: SITA FA LWA LLA, SHA RMIN Other Clinician: Referring Provider: Treating Provider/Extender: Linton Ham SITA FA LWA LLA, SHA RMIN Weeks in Treatment: 21 Subjective History of Present Illness (HPI) Admission 06/15/2019 This is a 39 year old woman who comes to [place] from [facility] skilled facility in [facility]. I am able to follow a lot of her history on care everywhere in epic. The patient is a type II diabetic and has been on dialysis. 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 I am not exactly sure of the nature of the stroke at this present time. She has made a decent recovery and is now up walking with a walker. In August 2020 she was admitted to [facility] in [place] with L4-L5 discitis. Culture and sensitivity showed Klebsiella. She was discharged on 8 weeks of ceftazidime. In mid September she is readmitted to the hospital with a pressure  ulcer on her lower sacrum and necrotizing fasciitis. She required an extensive surgical debridement. CT scan of the pelvis at 1 point showed communication with the rectum. A colostomy was recommended and placed. Culture of this area showed Klebsiella and yeast. She was discharged on an extended course of meropenem and Diflucan. [facility] [place] We do not have any of these records. I think she was discharged to another nursing home but is come to [facility] skilled facility in [facility]. She was sent down here for review of the remaining wound. By review of the pictures that the patient had from September this is made considerable improvement. She tells me that the wound VAC was discontinued at the end of December 2020. Currently the facility is using a form of silver alginate to the wound surface. Past medical history type 2 diabetes with chronic renal failure on dialysis Tuesday Thursday and Saturday. CVA with left hemiparesis in June 2020 but she appears to be making a good recovery. Chronic diastolic heart failure, hypertension 2/19; patient's wound area slightly smaller. The area that was towards her anus has closed over. We are using Santyl covered with Hydrofera Blue. We have not been able to get her lab work apparently her veins are very difficult for phlebotomy. They are trying to get the blood work done where she dialyzes in [facility] but that requires some logistical issues. 2/26; not much change in the wound. Still a nonviable surface we have been using Santyl and Hydrofera Blue. I changed her to [facility] only. She tells me she has rigorously offloading this area she also complains of some pain 3/5; still no change here. I switch to pure Hydrofera Blue. She still complains of a lot of pain although is a big wound it seems out of proportion to what I might expect.  This was initially a surgical wound secondary to  necrotizing wound infection. She is on dialysis. Other than that she thinks she is offloading this fairly rigorously. She has not been systemically unwell 3/26; I switched to Iodoflex 3 weeks ago. This really seems to have helped, much better surface and slight improvement in the surface area. 4/26; the patient is still on Iodoflex she missed an appointment 2 weeks ago. Doing slightly smaller. She is on dialysis 5/10; using Hydrofera Blue. The major improvement here is the complete lack of depth. She has islands of epithelialization 09/24/19-Patient comes in at 2 weeks, dimensions about the same, there is an [place] of epithelium at margin of wound, no slough noted 10/15/2019 upon evaluation today patient appears to be doing well with regard to her wound. She has been tolerating the dressing changes in the sacral region without complication and at this point she seems to be healing quite nicely. I am extremely pleased with how things are progressing and the epithelial tissue seems to be spreading from the central portion as well which is also excellent. 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 We have been using Hydrofera Blue. The smaller area which has depth at 0.7 cm there is a question about whether they have been packing anything into this or just flaring Hydrofera Blue over the top Objective Constitutional Vitals Time Taken: 12:50 PM, Height: 65 in, Weight: 130 lbs, BMI: 21.6, Temperature: 97.8 F, Pulse: 92 bpm, Respiratory Rate: 19 breaths/min, Blood Pressure: 158/107 mmHg. Psychiatric Anxious patient but no overt changes from usual. General Notes: Wound exam; most of this is healed she has a superficial area with heaped clean granulation around a about 20% of the circumference. A small deeper area inferiorly at 0.7 cm in depth. This does not go to bone surface appears clean no evidence of surrounding infection however the healing  is left skin over bone in this area Integumentary (Hair, Skin) Wound #1 status is Open. Original cause of wound was Pressure Injury. The wound is located on the Sacrum. The wound measures 4.5cm length x 2cm width x 0.6cm depth; 7.069cm^2 area and 4.241cm^3 volume. There is Fat Layer (Subcutaneous Tissue) Exposed exposed. There is no tunneling or undermining noted. There is a medium amount of serosanguineous drainage noted. The wound margin is thickened. There is large (67-100%) red, pink granulation within the wound bed. There is no necrotic tissue within the wound bed. Assessment Active Problems ICD-10 Pressure ulcer of sacral region, stage 4 Disruption of external operation (surgical) wound, not elsewhere classified, subsequent encounter Necrotizing fasciitis Plan Follow-up Appointments: Return appointment in 1 month. Dressing Change Frequency: Wound #1 Sacrum: Change dressing every day. Skin Barriers/Peri-Wound Care: Wound #1 Sacrum: Skin Prep Wound Cleansing: Wound #1 Sacrum: Clean wound with Wound Cleanser - or normal saline Primary Wound Dressing: Wound #1 Sacrum: Hydrofera Blue - Classic - make sure to lightly pack into open area medially Secondary Dressing: Wound #1 Sacrum: Foam Border - or ABD Off-Loading: Turn and reposition every 2 hours 1. I am continuing the [facility] I have asked the facility to pack the wound slightly/gently. 2. The larger superficial area plate appears clean. No debridement was required 3. No evidence of infection in this area. Electronic Signature(s) Signed: 11/13/2019 8:12:39 AM By: Linton Ham MD Entered By: Linton Ham on 11/12/2019 13:18:10 -------------------------------------------------------------------------------- SuperBill Details Patient Name: Date of Service: [name] Barnett, [name] Barnett 11/12/2019 Medical Record Number: [id number] Patient Account  Number: [id number] Date of Birth/Sex: Treating RN: Sep 07, 1980 (39 y.o. [name] Primary Care Provider: SITA FA LWA LLA, SHA RMIN Other Clinician: Referring Provider: Treating Provider/Extender: Linton Ham SITA FA LWA LLA, SHA RMIN Weeks in Treatment: 21 Diagnosis Coding ICD-10 Codes Code Description L89.154 Pressure ulcer of sacral region, stage 4 T81.31XD Disruption of external operation (surgical) wound, not elsewhere classified, subsequent encounter M72.6 Necrotizing fasciitis Facility Procedures CPT4 Code: [id number] Description: 99213 - WOUND CARE VISIT-LEV 3 EST PT Modifier: Quantity: 1 Physician Procedures : CPT4 Code Description Modifier [id number] 99213 - WC PHYS LEVEL 3 - EST PT ICD-10 Diagnosis Description L89.154 Pressure ulcer of sacral region, stage 4 T81.31XD Disruption of external operation (surgical) wound, not elsewhere classified, subsequent  encounter Quantity: 1 Electronic Signature(s) Signed: 11/12/2019 5:04:17 PM By: Levan Hurst RN, BSN Signed: 11/13/2019 8:12:39 AM By: Linton Ham MD Entered By: Levan Hurst on 11/12/2019 13:42:29

## 2019-12-17 ENCOUNTER — Encounter (HOSPITAL_BASED_OUTPATIENT_CLINIC_OR_DEPARTMENT_OTHER): Payer: Medicare Other | Attending: Internal Medicine | Admitting: Internal Medicine

## 2019-12-17 ENCOUNTER — Other Ambulatory Visit: Payer: Self-pay

## 2019-12-17 DIAGNOSIS — D631 Anemia in chronic kidney disease: Secondary | ICD-10-CM | POA: Diagnosis not present

## 2019-12-17 DIAGNOSIS — I132 Hypertensive heart and chronic kidney disease with heart failure and with stage 5 chronic kidney disease, or end stage renal disease: Secondary | ICD-10-CM | POA: Insufficient documentation

## 2019-12-17 DIAGNOSIS — E1139 Type 2 diabetes mellitus with other diabetic ophthalmic complication: Secondary | ICD-10-CM | POA: Insufficient documentation

## 2019-12-17 DIAGNOSIS — M726 Necrotizing fasciitis: Secondary | ICD-10-CM | POA: Diagnosis not present

## 2019-12-17 DIAGNOSIS — I509 Heart failure, unspecified: Secondary | ICD-10-CM | POA: Insufficient documentation

## 2019-12-17 DIAGNOSIS — F419 Anxiety disorder, unspecified: Secondary | ICD-10-CM | POA: Insufficient documentation

## 2019-12-17 DIAGNOSIS — E1151 Type 2 diabetes mellitus with diabetic peripheral angiopathy without gangrene: Secondary | ICD-10-CM | POA: Diagnosis not present

## 2019-12-17 DIAGNOSIS — E1122 Type 2 diabetes mellitus with diabetic chronic kidney disease: Secondary | ICD-10-CM | POA: Insufficient documentation

## 2019-12-17 DIAGNOSIS — E11622 Type 2 diabetes mellitus with other skin ulcer: Secondary | ICD-10-CM | POA: Insufficient documentation

## 2019-12-17 DIAGNOSIS — H42 Glaucoma in diseases classified elsewhere: Secondary | ICD-10-CM | POA: Diagnosis not present

## 2019-12-17 DIAGNOSIS — L89154 Pressure ulcer of sacral region, stage 4: Secondary | ICD-10-CM | POA: Diagnosis not present

## 2019-12-17 DIAGNOSIS — N186 End stage renal disease: Secondary | ICD-10-CM | POA: Insufficient documentation

## 2019-12-17 DIAGNOSIS — E1140 Type 2 diabetes mellitus with diabetic neuropathy, unspecified: Secondary | ICD-10-CM | POA: Insufficient documentation

## 2019-12-17 DIAGNOSIS — I2510 Atherosclerotic heart disease of native coronary artery without angina pectoris: Secondary | ICD-10-CM | POA: Diagnosis not present

## 2019-12-17 NOTE — Progress Notes (Signed)
[name] REIZY, DUNLOW ([id number]) Visit Report for 12/17/2019 Problem List Details Patient Name: Date of Service: [name] Barnett, [name] Barnett 12/17/2019 12:30 PM Medical Record Number: [id number] Patient Account Number: [id number] Date of Birth/Sex: Treating RN: 1981/01/16 (39 y.o. [name] Primary Care Provider: SITA FA LWA LLA, SHA RMIN Other Clinician: Referring Provider: Treating Provider/Extender: Linton Ham SITA FA LWA LLA, SHA RMIN Weeks in Treatment: 26 Active Problems ICD-10 Encounter Code Description Active Date MDM Diagnosis L89.154 Pressure ulcer of sacral region, stage 4 06/15/2019 No Yes T81.31XD Disruption of external operation (surgical) wound, not elsewhere classified, 06/15/2019 No Yes subsequent encounter M72.6 Necrotizing fasciitis 06/15/2019 No Yes Inactive Problems ICD-10 Code Description Active Date Inactive Date L89.152 Pressure ulcer of sacral region, stage 2 06/15/2019 06/15/2019 Resolved Problems Electronic Signature(s) Signed: 12/17/2019 5:14:32 PM By: Levan Hurst RN, BSN Signed: 12/17/2019 5:53:04 PM By: Linton Ham MD Entered By: Levan Hurst on 12/17/2019 13:36:55 -------------------------------------------------------------------------------- SuperBill Details Patient Name: Date of Service: [name] Barnett, [name] Barnett 12/17/2019 Medical Record Number: [id number] Patient Account Number: [id number] Date of Birth/Sex: Treating RN: [birth year] (39 y.o. [name] Primary Care Provider: SITA FA LWA LLA, SHA RMIN Other Clinician: Referring Provider: Treating Provider/Extender: Linton Ham SITA FA LWA LLA, SHA RMIN Weeks in Treatment: 26 Diagnosis Coding ICD-10 Codes Code Description L89.154 Pressure ulcer of sacral region, stage 4 T81.31XD Disruption of external operation (surgical) wound, not elsewhere classified, subsequent encounter M72.6 Necrotizing fasciitis Facility Procedures CPT4 Code: [id number] Description: 70488 - WOUND  CARE VISIT-LEV 3 EST PT Modifier: Quantity: 1 Electronic Signature(s) Signed: 12/17/2019 5:14:32 PM By: Levan Hurst RN, BSN Signed: 12/17/2019 5:53:04 PM By: Linton Ham MD Entered By: Levan Hurst on 12/17/2019 17:14:11

## 2019-12-26 ENCOUNTER — Encounter (HOSPITAL_BASED_OUTPATIENT_CLINIC_OR_DEPARTMENT_OTHER): Payer: Medicare Other | Admitting: Physician Assistant

## 2019-12-28 ENCOUNTER — Encounter (HOSPITAL_BASED_OUTPATIENT_CLINIC_OR_DEPARTMENT_OTHER): Payer: Medicare Other | Admitting: Internal Medicine

## 2019-12-29 NOTE — Progress Notes (Signed)
MCNEILL SKIE, VITRANO ([id number]) Visit Report for 12/17/2019 Arrival Information Details Patient Name: Date of Service: MCNEILL ZANARIA, MORELL 12/17/2019 12:30 PM Medical Record Number: [id number] Patient Account Number: [id number] Date of Birth/Sex: Treating RN: 09-Apr-1981 (39 y.o. F) Epps, [place] Primary Care Jazzlynn Rawe: SITA FA LWA LLA, SHA RMIN Other Clinician: Referring Ayah Cozzolino: Treating Giuseppe Duchemin/Extender: Linton Ham SITA FA LWA LLA, SHA RMIN Weeks in Treatment: 26 Visit Information History Since Last Visit All ordered tests and consults were completed: No Patient Arrived: Wheel Chair Added or deleted any medications: No Arrival Time: 12:53 Any new allergies or adverse reactions: No Accompanied By: caregiver Had a fall or experienced change in No Transfer Assistance: Manual activities of daily living that may affect Patient Identification Verified: Yes risk of falls: Secondary Verification Process Completed: Yes Signs or symptoms of abuse/neglect since last visito No Patient Requires Transmission-Based Precautions: No Hospitalized since last visit: No Patient Has Alerts: No Implantable device outside of the clinic excluding No cellular tissue based products placed in the center since last visit: Has Dressing in Place as Prescribed: Yes Pain Present Now: No Notes Patient was unable to wait to see MD. Electronic Signature(s) Signed: 12/17/2019 5:14:32 PM By: Levan Hurst RN, BSN Entered By: Levan Hurst on 12/17/2019 17:11:50 -------------------------------------------------------------------------------- Clinic Level of Care Assessment Details Patient Name: Date of Service: MCNEILL DA BRANDELYN, HENNE 12/17/2019 12:30 PM Medical Record Number: [id number] Patient Account Number: [id number] Date of Birth/Sex: Treating RN: Mar 04, 1981 (39 y.o. [name] Primary Care Sharone Picchi: SITA FA LWA LLA, SHA RMIN Other Clinician: Referring Rettie Laird: Treating  Matia Zelada/Extender: Linton Ham SITA FA LWA LLA, SHA RMIN Weeks in Treatment: 26 Clinic Level of Care Assessment Items TOOL 4 Quantity Score X- 1 0 Use when only an EandM is performed on FOLLOW-UP visit ASSESSMENTS - Nursing Assessment / Reassessment X- 1 10 Reassessment of Co-morbidities (includes updates in patient status) X- 1 5 Reassessment of Adherence to Treatment Plan ASSESSMENTS - Wound and Skin A ssessment / Reassessment X - Simple Wound Assessment / Reassessment - one wound 1 5 []  - 0 Complex Wound Assessment / Reassessment - multiple wounds []  - 0 Dermatologic / Skin Assessment (not related to wound area) ASSESSMENTS - Focused Assessment []  - 0 Circumferential Edema Measurements - multi extremities []  - 0 Nutritional Assessment / Counseling / Intervention []  - 0 Lower Extremity Assessment (monofilament, tuning fork, pulses) []  - 0 Peripheral Arterial Disease Assessment (using hand held doppler) ASSESSMENTS - Ostomy and/or Continence Assessment and Care []  - 0 Incontinence Assessment and Management []  - 0 Ostomy Care Assessment and Management (repouching, etc.) PROCESS - Coordination of Care X - Simple Patient / Family Education for ongoing care 1 15 []  - 0 Complex (extensive) Patient / Family Education for ongoing care X- 1 10 Staff obtains Programmer, systems, Records, T Results / Process Orders est []  - 0 Staff telephones HHA, Nursing Homes / Clarify orders / etc []  - 0 Routine Transfer to another Facility (non-emergent condition) []  - 0 Routine Hospital Admission (non-emergent condition) []  - 0 New Admissions / Biomedical engineer / Ordering NPWT Apligraf, etc. , []  - 0 Emergency Hospital Admission (emergent condition) X- 1 10 Simple Discharge Coordination []  - 0 Complex (extensive) Discharge Coordination PROCESS - Special Needs []  - 0 Pediatric / Minor Patient Management []  - 0 Isolation Patient Management []  - 0 Hearing / Language / Visual  special needs []  - 0 Assessment of Community assistance (transportation, D/C planning, etc.) []  - 0 Additional assistance / Altered mentation []  -  0 Support Surface(s) Assessment (bed, cushion, seat, etc.) INTERVENTIONS - Wound Cleansing / Measurement X - Simple Wound Cleansing - one wound 1 5 []  - 0 Complex Wound Cleansing - multiple wounds X- 1 5 Wound Imaging (photographs - any number of wounds) []  - 0 Wound Tracing (instead of photographs) X- 1 5 Simple Wound Measurement - one wound []  - 0 Complex Wound Measurement - multiple wounds INTERVENTIONS - Wound Dressings X - Small Wound Dressing one or multiple wounds 1 10 []  - 0 Medium Wound Dressing one or multiple wounds []  - 0 Large Wound Dressing one or multiple wounds []  - 0 Application of Medications - topical []  - 0 Application of Medications - injection INTERVENTIONS - Miscellaneous []  - 0 External ear exam []  - 0 Specimen Collection (cultures, biopsies, blood, body fluids, etc.) []  - 0 Specimen(s) / Culture(s) sent or taken to Lab for analysis []  - 0 Patient Transfer (multiple staff / Civil Service fast streamer / Similar devices) []  - 0 Simple Staple / Suture removal (25 or less) []  - 0 Complex Staple / Suture removal (26 or more) []  - 0 Hypo / Hyperglycemic Management (close monitor of Blood Glucose) []  - 0 Ankle / Brachial Index (ABI) - do not check if billed separately X- 1 5 Vital Signs Has the patient been seen at the hospital within the last three years: Yes Total Score: 85 Level Of Care: New/Established - Level 3 Electronic Signature(s) Signed: 12/17/2019 5:14:32 PM By: Levan Hurst RN, BSN Entered By: Levan Hurst on 12/17/2019 17:14:06 -------------------------------------------------------------------------------- Encounter Discharge Information Details Patient Name: Date of Service: MCNEILL DA [place], [place] 12/17/2019 12:30 PM Medical Record Number: [id number] Patient Account Number: [id number] Date of  Birth/Sex: Treating RN: Apr 25, 1981 (39 y.o. [name] Primary Care Tahji [place] Edwards: SITA FA LWA LLA, SHA RMIN Other Clinician: Referring Davina Howlett: Treating Chabely Norby/Extender: Linton Ham SITA FA LWA LLA, SHA RMIN Weeks in Treatment: 26 Encounter Discharge Information Items Discharge Condition: Stable Ambulatory Status: Wheelchair Discharge Destination: Home Transportation: Private Auto Accompanied By: caregiver Schedule Follow-up Appointment: Yes Clinical Summary of Care: Patient Declined Electronic Signature(s) Signed: 12/17/2019 5:14:32 PM By: Levan Hurst RN, BSN Entered By: Levan Hurst on 12/17/2019 17:12:51 -------------------------------------------------------------------------------- Pain Assessment Details Patient Name: Date of Service: MCNEILL DA ANAYELI, AREL 12/17/2019 12:30 PM Medical Record Number: [id number] Patient Account Number: [id number] Date of Birth/Sex: Treating RN: 07/04/1980 (39 y.o. F) Epps, [place] Primary Care Hurman Ketelsen: SITA FA LWA LLA, SHA RMIN Other Clinician: Referring Makela Niehoff: Treating Khalee Mazo/Extender: Linton Ham SITA FA LWA LLA, SHA RMIN Weeks in Treatment: 26 Active Problems Location of Pain Severity and Description of Pain Patient Has Paino No Site Locations Pain Management and Medication Current Pain Management: Electronic Signature(s) Signed: 12/28/2019 5:50:16 PM By: Carlene Coria RN Entered By: Carlene Coria on 12/17/2019 12:54:02 -------------------------------------------------------------------------------- Wound Assessment Details Patient Name: Date of Service: TIMIRA, BIEDA 12/17/2019 12:30 PM Medical Record Number: [id number] Patient Account Number: [id number] Date of Birth/Sex: Treating RN: June 09, 1980 (39 y.o. F) Epps, [place] Primary Care Adison Reifsteck: SITA FA LWA LLA, SHA RMIN Other Clinician: Referring Natiya Seelinger: Treating Kannon Granderson/Extender: Linton Ham SITA FA LWA LLA, SHA RMIN Weeks in Treatment:  26 Wound Status Wound Number: 1 Primary Pressure Ulcer Etiology: Wound Location: Sacrum Secondary Necrotizing Infection Wounding Event: Pressure Injury Etiology: Date Acquired: 01/11/2019 Wound Open Weeks Of Treatment: 26 Status: Clustered Wound: No Comorbid Cataracts, Glaucoma, Anemia, Congestive Heart Failure, History: Coronary Artery Disease, Hypertension, Type II Diabetes, End Stage Renal Disease, Neuropathy, Confinement Anxiety Photos Photo Uploaded By: Mikeal Hawthorne  on 12/20/2019 15:57:55 Wound Measurements Length: (cm) 4.5 Width: (cm) 3 Depth: (cm) 1 Area: (cm) 10.603 Volume: (cm) 10.603 Wound Description Classification: Category/Stage IV Wound Margin: Thickened Exudate Amount: Medium Exudate Type: Serosanguineous Exudate Color: red, brown Foul Odor After Cleansing: Slough/Fibrino % Reduction in Area: 71.9% % Reduction in Volume: 64.8% Epithelialization: Small (1-33%) Tunneling: No Undermining: No No Yes Wound Bed Granulation Amount: Large (67-100%) Exposed Structure Granulation Quality: Red, Pink Fascia Exposed: No Necrotic Amount: Small (1-33%) Fat Layer (Subcutaneous Tissue) Exposed: Yes Necrotic Quality: Adherent Slough Tendon Exposed: No Muscle Exposed: No Joint Exposed: No Bone Exposed: No Electronic Signature(s) Signed: 12/28/2019 5:50:16 PM By: Carlene Coria RN Entered By: Carlene Coria on 12/17/2019 12:53:08 -------------------------------------------------------------------------------- Wound Assessment Details Patient Name: Date of Service: MCNEILL JERE, VANBUREN 12/17/2019 12:30 PM Medical Record Number: [id number] Patient Account Number: [id number] Date of Birth/Sex: Treating RN: [birth year] (39 y.o. F) Epps, [place] Primary Care Nox Talent: SITA FA LWA LLA, SHA RMIN Other Clinician: Referring Munir Victorian: Treating Kyaira Trantham/Extender: Linton Ham SITA FA LWA LLA, SHA RMIN Weeks in Treatment: 26 Wound Status Wound Number: 3 Primary  Pressure Ulcer Etiology: Wound Location: Gluteal fold Wound Open Wounding Event: Gradually Appeared Status: Date Acquired: 11/26/2019 Comorbid Cataracts, Glaucoma, Anemia, Congestive Heart Failure, Weeks Of Treatment: 0 History: Coronary Artery Disease, Hypertension, Type II Diabetes, End Clustered Wound: No Stage Renal Disease, Neuropathy, Confinement Anxiety Photos Wound Measurements Length: (cm) 1.1 Width: (cm) 0.7 Depth: (cm) 0.2 Area: (cm) 0.605 Volume: (cm) 0.121 % Reduction in Area: 0% % Reduction in Volume: 0% Epithelialization: None Tunneling: No Undermining: No Wound Description Classification: Category/Stage II Exudate Amount: Medium Exudate Type: Serosanguineous Exudate Color: red, brown Wound Bed Granulation Amount: Medium (34-66%) Granulation Quality: Pink Necrotic Amount: Medium (34-66%) Necrotic Quality: Adherent Slough Foul Odor After Cleansing: No Slough/Fibrino Yes Exposed Structure Fascia Exposed: No Fat Layer (Subcutaneous Tissue) Exposed: Yes Tendon Exposed: No Muscle Exposed: No Joint Exposed: No Bone Exposed: No Treatment Notes Wound #3 (Gluteal fold) 1. Cleanse With Wound Cleanser 3. Primary Dressing Applied Hydrofera Blue 4. Secondary Dressing Foam Border Dressing Electronic Signature(s) Signed: 12/20/2019 5:08:58 PM By: Mikeal Hawthorne EMT/HBOT/SD Signed: 12/28/2019 5:50:16 PM By: Carlene Coria RN Entered By: Mikeal Hawthorne on 12/20/2019 15:46:12 -------------------------------------------------------------------------------- Vitals Details Patient Name: Date of Service: MCNEILL DA [place], [place]in[place] 12/17/2019 12:30 PM Medical Record Number: [id number] Patient Account Number: [id number] Date of Birth/Sex: Treating RN: 1980/09/10 (39 y.o. F) Epps, [place] Primary Care Vinicio Lynk: SITA FA LWA LLA, SHA RMIN Other Clinician: Referring Anthon Harpole: Treating Rheya Minogue/Extender: Linton Ham SITA FA LWA LLA, SHA RMIN Weeks in Treatment:  26 Vital Signs Time Taken: 12:53 Temperature (F): 98 Height (in): 65 Pulse (bpm): 92 Weight (lbs): 130 Respiratory Rate (breaths/min): 18 Body Mass Index (BMI): 21.6 Blood Pressure (mmHg): 139/90 Reference Range: 80 - 120 mg / dl Electronic Signature(s) Signed: 12/28/2019 5:50:16 PM By: Carlene Coria RN Entered By: Carlene Coria on 12/17/2019 12:53:55

## 2020-01-03 ENCOUNTER — Emergency Department (HOSPITAL_COMMUNITY)
Admission: EM | Admit: 2020-01-03 | Discharge: 2020-01-03 | Disposition: A | Discharge status: Home / Self Care | Payer: Medicare Other | Attending: Emergency Medicine | Admitting: Emergency Medicine

## 2020-01-03 ENCOUNTER — Encounter (HOSPITAL_COMMUNITY): Payer: Self-pay | Admitting: *Deleted

## 2020-01-03 ENCOUNTER — Other Ambulatory Visit: Payer: Self-pay

## 2020-01-03 DIAGNOSIS — I10 Essential (primary) hypertension: Secondary | ICD-10-CM | POA: Insufficient documentation

## 2020-01-03 DIAGNOSIS — S82831A Other fracture of upper and lower end of right fibula, initial encounter for closed fracture: Secondary | ICD-10-CM

## 2020-01-03 DIAGNOSIS — Y999 Unspecified external cause status: Secondary | ICD-10-CM | POA: Diagnosis not present

## 2020-01-03 DIAGNOSIS — X501XXA Overexertion from prolonged static or awkward postures, initial encounter: Secondary | ICD-10-CM | POA: Diagnosis not present

## 2020-01-03 DIAGNOSIS — Y93B9 Activity, other involving muscle strengthening exercises: Secondary | ICD-10-CM | POA: Insufficient documentation

## 2020-01-03 DIAGNOSIS — Y929 Unspecified place or not applicable: Secondary | ICD-10-CM | POA: Diagnosis not present

## 2020-01-03 DIAGNOSIS — S99911A Unspecified injury of right ankle, initial encounter: Secondary | ICD-10-CM | POA: Diagnosis present

## 2020-01-03 DIAGNOSIS — E119 Type 2 diabetes mellitus without complications: Secondary | ICD-10-CM | POA: Insufficient documentation

## 2020-01-03 DIAGNOSIS — S8264XA Nondisplaced fracture of lateral malleolus of right fibula, initial encounter for closed fracture: Secondary | ICD-10-CM | POA: Diagnosis not present

## 2020-01-03 HISTORY — DX: Cerebral infarction, unspecified: I63.9

## 2020-01-03 MED ORDER — OXYCODONE-ACETAMINOPHEN 5-325 MG PO TABS: 1.0000 | ORAL_TABLET | ORAL | 0 refills | Status: DC | PRN

## 2020-01-03 NOTE — ED Triage Notes (Signed)
Pt arrives via [facility] from [facility] facility with +ankle fracture. Pt was doing PT and felt a pop in her right ankle on Monday, + nondisplaced fracture of the distal fibula on xray today. Sent here for further eval. Pt is sleepy, easily arousable to voice, says she was given pain meds tonight before she went to bed.

## 2020-01-03 NOTE — ED Notes (Signed)
Report to [employer] with [facility]

## 2020-01-03 NOTE — ED Provider Notes (Signed)
[facility] EMERGENCY DEPARTMENT Provider Note   CSN: [id number] Arrival date & time: 01/03/20  0117     History Chief Complaint  Patient presents with  . Ankle Pain    [name] Barnett is a 39 y.o. female.  Doing physical therapy, twisted ankle, felt pop, pain and swelling since then. XR done at facility showed nondisplaced distal fibula fracture without change in ankle mortise. Given some pain meds at facility which seemed to help.    Ankle Pain Location:  Ankle Injury: yes   Ankle location:  R ankle      Past Medical History:  Diagnosis Date  . Diabetes mellitus without complication ([place])   . Hyperlipidemia   . Hypertension   . Renal disorder   . Stroke ([place])   . Vitamin D deficiency     There are no problems to display for this patient.   Past Surgical History:  Procedure Laterality Date  . cardiac stents    . COLOSTOMY    . wound on buttocks       OB History   No obstetric history on file.     No family history on file.  Social History   Tobacco Use  . Smoking status: Never Smoker  . Smokeless tobacco: Never Used  Substance Use Topics  . Alcohol use: Never  . Drug use: Never    Home Medications Prior to Admission medications   Medication Sig Start Date End Date Taking? Authorizing Provider  oxyCODONE-acetaminophen (PERCOCET) 5-325 MG tablet Take 1 tablet by mouth every 4 (four) hours as needed for severe pain. 01/03/20   Aissata Wilmore, Corene Cornea, MD    Allergies    Corn-containing products, Lisinopril, and Morphine and related  Review of Systems   Review of Systems  All other systems reviewed and are negative.   Physical Exam Updated Vital Signs BP 104/73   Pulse 91   Temp 97.9 F (36.6 C)   Resp 16   SpO2 99%   Physical Exam Vitals and nursing note reviewed.  Constitutional:      Appearance: She is well-developed.  HENT:     Head: Normocephalic and atraumatic.     Mouth/Throat:     Mouth: Mucous membranes are moist.      Pharynx: Oropharynx is clear.  Eyes:     Pupils: Pupils are equal, round, and reactive to light.  Cardiovascular:     Rate and Rhythm: Normal rate and regular rhythm.  Pulmonary:     Effort: No respiratory distress.     Breath sounds: No stridor.  Abdominal:     General: There is no distension.  Musculoskeletal:        General: Swelling and tenderness (right ankle) present. Normal range of motion.     Cervical back: Normal range of motion.  Skin:    General: Skin is warm and dry.  Neurological:     General: No focal deficit present.     Mental Status: She is alert.     ED Results / Procedures / Treatments   Labs (all labs ordered are listed, but only abnormal results are displayed) Labs Reviewed - No data to display  EKG None  Radiology No results found.         Procedures Procedures (including critical care time)  Medications Ordered in ED Medications - No data to display  ED Course  I have reviewed the triage vital signs and the nursing notes.  Pertinent labs & imaging results that were available during  my care of the patient were reviewed by me and considered in my medical decision making (see chart for details).    MDM Rules/Calculators/A&P                          NVI nondisplaced Fibula fracture. Boot placed. Ortho follow up recommended.   Final Clinical Impression(s) / ED Diagnoses Final diagnoses:  Closed fracture of distal end of right fibula, unspecified fracture morphology, initial encounter    Rx / DC Orders ED Discharge Orders         Ordered    oxyCODONE-acetaminophen (PERCOCET) 5-325 MG tablet  Every 4 hours PRN        01/03/20 0135           Bren Borys, Corene Cornea, MD 01/03/20 [id number]

## 2020-01-03 NOTE — ED Notes (Signed)
[facility] on unit for transport to facility

## 2020-01-13 ENCOUNTER — Inpatient Hospital Stay (HOSPITAL_COMMUNITY): Payer: Medicare Other

## 2020-01-13 ENCOUNTER — Other Ambulatory Visit: Payer: Self-pay

## 2020-01-13 ENCOUNTER — Encounter (HOSPITAL_COMMUNITY): Payer: Self-pay | Admitting: Emergency Medicine

## 2020-01-13 ENCOUNTER — Inpatient Hospital Stay (HOSPITAL_COMMUNITY)
Admission: EM | Admit: 2020-01-13 | Discharge: 2020-01-16 | DRG: 871 | Disposition: A | Discharge status: Skilled Nursing Facility | Payer: Medicare Other | Source: Skilled Nursing Facility | Attending: Internal Medicine | Admitting: Internal Medicine

## 2020-01-13 ENCOUNTER — Emergency Department (HOSPITAL_COMMUNITY): Payer: Medicare Other

## 2020-01-13 DIAGNOSIS — A419 Sepsis, unspecified organism: Principal | ICD-10-CM

## 2020-01-13 DIAGNOSIS — J9601 Acute respiratory failure with hypoxia: Secondary | ICD-10-CM | POA: Diagnosis present

## 2020-01-13 DIAGNOSIS — N390 Urinary tract infection, site not specified: Secondary | ICD-10-CM | POA: Diagnosis present

## 2020-01-13 DIAGNOSIS — Z79899 Other long term (current) drug therapy: Secondary | ICD-10-CM | POA: Diagnosis not present

## 2020-01-13 DIAGNOSIS — Z794 Long term (current) use of insulin: Secondary | ICD-10-CM

## 2020-01-13 DIAGNOSIS — I10 Essential (primary) hypertension: Secondary | ICD-10-CM

## 2020-01-13 DIAGNOSIS — Y95 Nosocomial condition: Secondary | ICD-10-CM | POA: Diagnosis present

## 2020-01-13 DIAGNOSIS — R509 Fever, unspecified: Secondary | ICD-10-CM | POA: Diagnosis not present

## 2020-01-13 DIAGNOSIS — J189 Pneumonia, unspecified organism: Secondary | ICD-10-CM | POA: Diagnosis present

## 2020-01-13 DIAGNOSIS — I132 Hypertensive heart and chronic kidney disease with heart failure and with stage 5 chronic kidney disease, or end stage renal disease: Secondary | ICD-10-CM | POA: Diagnosis present

## 2020-01-13 DIAGNOSIS — E785 Hyperlipidemia, unspecified: Secondary | ICD-10-CM | POA: Diagnosis present

## 2020-01-13 DIAGNOSIS — L98499 Non-pressure chronic ulcer of skin of other sites with unspecified severity: Secondary | ICD-10-CM

## 2020-01-13 DIAGNOSIS — Z933 Colostomy status: Secondary | ICD-10-CM

## 2020-01-13 DIAGNOSIS — M7989 Other specified soft tissue disorders: Secondary | ICD-10-CM

## 2020-01-13 DIAGNOSIS — Z992 Dependence on renal dialysis: Secondary | ICD-10-CM

## 2020-01-13 DIAGNOSIS — L89159 Pressure ulcer of sacral region, unspecified stage: Secondary | ICD-10-CM | POA: Diagnosis not present

## 2020-01-13 DIAGNOSIS — Z955 Presence of coronary angioplasty implant and graft: Secondary | ICD-10-CM | POA: Diagnosis not present

## 2020-01-13 DIAGNOSIS — R6520 Severe sepsis without septic shock: Secondary | ICD-10-CM | POA: Diagnosis present

## 2020-01-13 DIAGNOSIS — Z7982 Long term (current) use of aspirin: Secondary | ICD-10-CM

## 2020-01-13 DIAGNOSIS — I69398 Other sequelae of cerebral infarction: Secondary | ICD-10-CM | POA: Diagnosis not present

## 2020-01-13 DIAGNOSIS — E1122 Type 2 diabetes mellitus with diabetic chronic kidney disease: Secondary | ICD-10-CM | POA: Diagnosis present

## 2020-01-13 DIAGNOSIS — E11622 Type 2 diabetes mellitus with other skin ulcer: Secondary | ICD-10-CM

## 2020-01-13 DIAGNOSIS — I5022 Chronic systolic (congestive) heart failure: Secondary | ICD-10-CM | POA: Diagnosis present

## 2020-01-13 DIAGNOSIS — E119 Type 2 diabetes mellitus without complications: Secondary | ICD-10-CM

## 2020-01-13 DIAGNOSIS — E1169 Type 2 diabetes mellitus with other specified complication: Secondary | ICD-10-CM | POA: Diagnosis present

## 2020-01-13 DIAGNOSIS — Z20822 Contact with and (suspected) exposure to covid-19: Secondary | ICD-10-CM | POA: Diagnosis present

## 2020-01-13 DIAGNOSIS — L89153 Pressure ulcer of sacral region, stage 3: Secondary | ICD-10-CM | POA: Diagnosis present

## 2020-01-13 DIAGNOSIS — N186 End stage renal disease: Secondary | ICD-10-CM | POA: Diagnosis present

## 2020-01-13 DIAGNOSIS — D631 Anemia in chronic kidney disease: Secondary | ICD-10-CM | POA: Diagnosis present

## 2020-01-13 LAB — RENAL FUNCTION PANEL
Albumin: 3.2 g/dL — ABNORMAL LOW (ref 3.5–5.0)
Anion gap: 20 — ABNORMAL HIGH (ref 5–15)
BUN: 104 mg/dL — ABNORMAL HIGH (ref 6–20)
CO2: 20 mmol/L — ABNORMAL LOW (ref 22–32)
Calcium: 8.8 mg/dL — ABNORMAL LOW (ref 8.9–10.3)
Chloride: 96 mmol/L — ABNORMAL LOW (ref 98–111)
Creatinine, Ser: 9.23 mg/dL — ABNORMAL HIGH (ref 0.44–1.00)
GFR calc Af Amer: 6 mL/min — ABNORMAL LOW (ref >60)
GFR calc non Af Amer: 5 mL/min — ABNORMAL LOW (ref >60)
Glucose, Bld: 203 mg/dL — ABNORMAL HIGH (ref 70–99)
Phosphorus: 5.6 mg/dL — ABNORMAL HIGH (ref 2.5–4.6)
Potassium: 5.1 mmol/L (ref 3.5–5.1)
Sodium: 136 mmol/L (ref 135–145)

## 2020-01-13 LAB — CBC
HCT: 25 % — ABNORMAL LOW (ref 36.0–46.0)
Hemoglobin: 7.8 g/dL — ABNORMAL LOW (ref 12.0–15.0)
MCH: 29.4 pg (ref 26.0–34.0)
MCHC: 31.2 g/dL (ref 30.0–36.0)
MCV: 94.3 fL (ref 80.0–100.0)
Platelets: 217 10*3/uL (ref 150–400)
RBC: 2.65 MIL/uL — ABNORMAL LOW (ref 3.87–5.11)
RDW: 15.7 % — ABNORMAL HIGH (ref 11.5–15.5)
WBC: 16.1 10*3/uL — ABNORMAL HIGH (ref 4.0–10.5)
nRBC: 0 % (ref 0.0–0.2)

## 2020-01-13 LAB — CBC WITH DIFFERENTIAL/PLATELET
Abs Immature Granulocytes: 0.1 10*3/uL — ABNORMAL HIGH (ref 0.00–0.07)
Basophils Absolute: 0.1 10*3/uL (ref 0.0–0.1)
Basophils Relative: 0 %
Eosinophils Absolute: 0 10*3/uL (ref 0.0–0.5)
Eosinophils Relative: 0 %
HCT: 25.1 % — ABNORMAL LOW (ref 36.0–46.0)
Hemoglobin: 8.1 g/dL — ABNORMAL LOW (ref 12.0–15.0)
Immature Granulocytes: 1 %
Lymphocytes Relative: 5 %
Lymphs Abs: 0.8 10*3/uL (ref 0.7–4.0)
MCH: 30.3 pg (ref 26.0–34.0)
MCHC: 32.3 g/dL (ref 30.0–36.0)
MCV: 94 fL (ref 80.0–100.0)
Monocytes Absolute: 0.5 10*3/uL (ref 0.1–1.0)
Monocytes Relative: 3 %
Neutro Abs: 14.7 10*3/uL — ABNORMAL HIGH (ref 1.7–7.7)
Neutrophils Relative %: 91 %
Platelets: 221 10*3/uL (ref 150–400)
RBC: 2.67 MIL/uL — ABNORMAL LOW (ref 3.87–5.11)
RDW: 15.8 % — ABNORMAL HIGH (ref 11.5–15.5)
WBC: 16.2 10*3/uL — ABNORMAL HIGH (ref 4.0–10.5)
nRBC: 0 % (ref 0.0–0.2)

## 2020-01-13 LAB — URINALYSIS, ROUTINE W REFLEX MICROSCOPIC
Bilirubin Urine: NEGATIVE
Glucose, UA: NEGATIVE mg/dL
Ketones, ur: NEGATIVE mg/dL
Nitrite: NEGATIVE
Protein, ur: >=300 mg/dL — AB
Specific Gravity, Urine: 1.014 (ref 1.005–1.030)
WBC, UA: >50 WBC/hpf — ABNORMAL HIGH (ref 0–5)
pH: 7 (ref 5.0–8.0)

## 2020-01-13 LAB — COMPREHENSIVE METABOLIC PANEL
ALT: 20 U/L (ref 0–44)
AST: 13 U/L — ABNORMAL LOW (ref 15–41)
Albumin: 3.4 g/dL — ABNORMAL LOW (ref 3.5–5.0)
Alkaline Phosphatase: 92 U/L (ref 38–126)
Anion gap: 19 — ABNORMAL HIGH (ref 5–15)
BUN: 91 mg/dL — ABNORMAL HIGH (ref 6–20)
CO2: 20 mmol/L — ABNORMAL LOW (ref 22–32)
Calcium: 9.1 mg/dL (ref 8.9–10.3)
Chloride: 98 mmol/L (ref 98–111)
Creatinine, Ser: 8.32 mg/dL — ABNORMAL HIGH (ref 0.44–1.00)
GFR calc Af Amer: 6 mL/min — ABNORMAL LOW (ref >60)
GFR calc non Af Amer: 5 mL/min — ABNORMAL LOW (ref >60)
Glucose, Bld: 95 mg/dL (ref 70–99)
Potassium: 4.8 mmol/L (ref 3.5–5.1)
Sodium: 137 mmol/L (ref 135–145)
Total Bilirubin: 1.2 mg/dL (ref 0.3–1.2)
Total Protein: 7.6 g/dL (ref 6.5–8.1)

## 2020-01-13 LAB — APTT: aPTT: 42 seconds — ABNORMAL HIGH (ref 24–36)

## 2020-01-13 LAB — LACTIC ACID, PLASMA
Lactic Acid, Venous: 1.1 mmol/L (ref 0.5–1.9)
Lactic Acid, Venous: 0.8 mmol/L (ref 0.5–1.9)

## 2020-01-13 LAB — SARS CORONAVIRUS 2 BY RT PCR (HOSPITAL ORDER, PERFORMED IN ~~LOC~~ HOSPITAL LAB): SARS Coronavirus 2: NEGATIVE

## 2020-01-13 LAB — CBG MONITORING, ED
Glucose-Capillary: 171 mg/dL — ABNORMAL HIGH (ref 70–99)
Glucose-Capillary: 91 mg/dL (ref 70–99)

## 2020-01-13 LAB — HEMOGLOBIN A1C
Hgb A1c MFr Bld: 7.1 % — ABNORMAL HIGH (ref 4.8–5.6)
Mean Plasma Glucose: 157.07 mg/dL

## 2020-01-13 LAB — D-DIMER, QUANTITATIVE: D-Dimer, Quant: 3.56 ug/mL-FEU — ABNORMAL HIGH (ref 0.00–0.50)

## 2020-01-13 LAB — PROCALCITONIN: Procalcitonin: 14.64 ng/mL

## 2020-01-13 LAB — PROTIME-INR
INR: 1.4 — ABNORMAL HIGH (ref 0.8–1.2)
Prothrombin Time: 16.3 seconds — ABNORMAL HIGH (ref 11.4–15.2)

## 2020-01-13 MED ORDER — AMLODIPINE BESYLATE 5 MG PO TABS
10.0000 mg | ORAL_TABLET | Freq: Every day | ORAL | Status: DC
  Administered 2020-01-14 – 2020-01-16 (×3): 10 mg via ORAL
  Filled 2020-01-13 (×3): qty 2

## 2020-01-13 MED ORDER — HEPARIN SODIUM (PORCINE) 5000 UNIT/ML IJ SOLN
5000.0000 [IU] | Freq: Three times a day (TID) | INTRAMUSCULAR | Status: DC
  Administered 2020-01-13 – 2020-01-16 (×8): 5000 [IU] via SUBCUTANEOUS
  Filled 2020-01-13 (×9): qty 1

## 2020-01-13 MED ORDER — LIDOCAINE-PRILOCAINE 2.5-2.5 % EX CREA: 1.0000 "application " | TOPICAL_CREAM | CUTANEOUS | Status: DC | PRN

## 2020-01-13 MED ORDER — CHLORHEXIDINE GLUCONATE CLOTH 2 % EX PADS: 6.0000 | MEDICATED_PAD | Freq: Every day | CUTANEOUS | Status: DC

## 2020-01-13 MED ORDER — LACTATED RINGERS IV BOLUS (SEPSIS): 1000.0000 mL | Freq: Once | INTRAVENOUS | Status: DC

## 2020-01-13 MED ORDER — ROPINIROLE HCL 0.25 MG PO TABS
0.5000 mg | ORAL_TABLET | Freq: Two times a day (BID) | ORAL | Status: DC
  Administered 2020-01-14 – 2020-01-16 (×5): 0.5 mg via ORAL
  Filled 2020-01-13 (×2): qty 1
  Filled 2020-01-13: qty 2
  Filled 2020-01-13: qty 1
  Filled 2020-01-13: qty 2
  Filled 2020-01-13: qty 1
  Filled 2020-01-13 (×2): qty 2
  Filled 2020-01-13 (×2): qty 1

## 2020-01-13 MED ORDER — IOHEXOL 350 MG/ML SOLN
75.0000 mL | Freq: Once | INTRAVENOUS | Status: AC | PRN
  Administered 2020-01-13: 75 mL via INTRAVENOUS

## 2020-01-13 MED ORDER — SENNOSIDES-DOCUSATE SODIUM 8.6-50 MG PO TABS
2.0000 | ORAL_TABLET | Freq: Every day | ORAL | Status: DC
  Administered 2020-01-15: 2 via ORAL
  Filled 2020-01-13 (×6): qty 2

## 2020-01-13 MED ORDER — ACETAMINOPHEN 500 MG PO TABS
1000.0000 mg | ORAL_TABLET | Freq: Once | ORAL | Status: AC
  Administered 2020-01-13: 1000 mg via ORAL
  Filled 2020-01-13: qty 2

## 2020-01-13 MED ORDER — INSULIN ASPART 100 UNIT/ML ~~LOC~~ SOLN
5.0000 [IU] | Freq: Three times a day (TID) | SUBCUTANEOUS | Status: DC
  Administered 2020-01-14: 5 [IU] via SUBCUTANEOUS
  Filled 2020-01-13: qty 1

## 2020-01-13 MED ORDER — ACETAMINOPHEN 650 MG RE SUPP
650.0000 mg | Freq: Four times a day (QID) | RECTAL | Status: DC | PRN
  Administered 2020-01-13: 650 mg via RECTAL
  Filled 2020-01-13: qty 1

## 2020-01-13 MED ORDER — LACTATED RINGERS IV SOLN: INTRAVENOUS | Status: DC

## 2020-01-13 MED ORDER — VITAMIN D 25 MCG (1000 UNIT) PO TABS
2000.0000 [IU] | ORAL_TABLET | Freq: Every day | ORAL | Status: DC
  Administered 2020-01-14 – 2020-01-16 (×3): 2000 [IU] via ORAL
  Filled 2020-01-13 (×3): qty 2

## 2020-01-13 MED ORDER — INSULIN GLARGINE 100 UNIT/ML ~~LOC~~ SOLN
10.0000 [IU] | Freq: Every morning | SUBCUTANEOUS | Status: DC
  Administered 2020-01-15: 10 [IU] via SUBCUTANEOUS
  Filled 2020-01-13 (×4): qty 0.1

## 2020-01-13 MED ORDER — JUVEN PO PACK
1.0000 | PACK | Freq: Two times a day (BID) | ORAL | Status: DC
  Administered 2020-01-14 – 2020-01-16 (×3): 1 via ORAL
  Filled 2020-01-13 (×4): qty 1

## 2020-01-13 MED ORDER — LIDOCAINE HCL (PF) 1 % IJ SOLN: 5.0000 mL | INTRAMUSCULAR | Status: DC | PRN

## 2020-01-13 MED ORDER — SODIUM CHLORIDE 0.9 % IV SOLN: 100.0000 mL | INTRAVENOUS | Status: DC | PRN

## 2020-01-13 MED ORDER — ADULT MULTIVITAMIN W/MINERALS CH
1.0000 | ORAL_TABLET | Freq: Every morning | ORAL | Status: DC
  Administered 2020-01-15 – 2020-01-16 (×2): 1 via ORAL
  Filled 2020-01-13 (×2): qty 1

## 2020-01-13 MED ORDER — IPRATROPIUM-ALBUTEROL 20-100 MCG/ACT IN AERS: 1.0000 | INHALATION_SPRAY | RESPIRATORY_TRACT | Status: DC | PRN

## 2020-01-13 MED ORDER — MELATONIN 3 MG PO TABS
3.0000 mg | ORAL_TABLET | Freq: Every day | ORAL | Status: DC
  Administered 2020-01-13 – 2020-01-15 (×3): 3 mg via ORAL
  Filled 2020-01-13 (×3): qty 1

## 2020-01-13 MED ORDER — ASCORBIC ACID 500 MG PO TABS
500.0000 mg | ORAL_TABLET | Freq: Two times a day (BID) | ORAL | Status: DC
  Administered 2020-01-13 – 2020-01-16 (×6): 500 mg via ORAL
  Filled 2020-01-13 (×6): qty 1

## 2020-01-13 MED ORDER — PIPERACILLIN-TAZOBACTAM 3.375 G IVPB 30 MIN
3.3750 g | Freq: Once | INTRAVENOUS | Status: AC
  Administered 2020-01-13: 3.375 g via INTRAVENOUS
  Filled 2020-01-13: qty 50

## 2020-01-13 MED ORDER — CALCIUM CARBONATE ANTACID 500 MG PO CHEW
1000.0000 mg | CHEWABLE_TABLET | Freq: Three times a day (TID) | ORAL | Status: DC
  Administered 2020-01-14 – 2020-01-16 (×6): 1000 mg via ORAL
  Filled 2020-01-13 (×5): qty 5

## 2020-01-13 MED ORDER — TIZANIDINE HCL 4 MG PO TABS
4.0000 mg | ORAL_TABLET | Freq: Two times a day (BID) | ORAL | Status: DC
  Administered 2020-01-13 – 2020-01-16 (×6): 4 mg via ORAL
  Filled 2020-01-13 (×6): qty 1

## 2020-01-13 MED ORDER — VANCOMYCIN HCL IN DEXTROSE 1-5 GM/200ML-% IV SOLN
1000.0000 mg | Freq: Once | INTRAVENOUS | Status: AC
  Administered 2020-01-13: 1000 mg via INTRAVENOUS
  Filled 2020-01-13: qty 200

## 2020-01-13 MED ORDER — RENA-VITE PO TABS
1.0000 | ORAL_TABLET | Freq: Every day | ORAL | Status: DC
  Administered 2020-01-14 – 2020-01-15 (×2): 1 via ORAL
  Filled 2020-01-13 (×5): qty 1

## 2020-01-13 MED ORDER — PRO-STAT SUGAR FREE PO LIQD
30.0000 mL | Freq: Two times a day (BID) | ORAL | Status: DC
  Administered 2020-01-14 – 2020-01-15 (×2): 30 mL via ORAL
  Filled 2020-01-13 (×10): qty 30

## 2020-01-13 MED ORDER — HEPARIN SODIUM (PORCINE) 1000 UNIT/ML DIALYSIS
1000.0000 [IU] | INTRAMUSCULAR | Status: DC | PRN
  Filled 2020-01-13: qty 1

## 2020-01-13 MED ORDER — ATENOLOL 25 MG PO TABS
12.5000 mg | ORAL_TABLET | Freq: Every day | ORAL | Status: DC
  Administered 2020-01-13 – 2020-01-16 (×3): 12.5 mg via ORAL
  Filled 2020-01-13 (×4): qty 1

## 2020-01-13 MED ORDER — PENTAFLUOROPROP-TETRAFLUOROETH EX AERO: 1.0000 "application " | INHALATION_SPRAY | CUTANEOUS | Status: DC | PRN

## 2020-01-13 MED ORDER — ROPINIROLE HCL 1 MG PO TABS
2.0000 mg | ORAL_TABLET | Freq: Two times a day (BID) | ORAL | Status: DC
  Administered 2020-01-14 – 2020-01-16 (×5): 2 mg via ORAL
  Filled 2020-01-13 (×10): qty 2

## 2020-01-13 MED ORDER — OXYCODONE-ACETAMINOPHEN 5-325 MG PO TABS
1.0000 | ORAL_TABLET | Freq: Every day | ORAL | Status: DC | PRN
  Administered 2020-01-13 – 2020-01-15 (×3): 1 via ORAL
  Filled 2020-01-13 (×3): qty 1

## 2020-01-13 MED ORDER — SODIUM CHLORIDE 0.9 % IV SOLN
2.0000 g | Freq: Every day | INTRAVENOUS | Status: DC
  Administered 2020-01-13 – 2020-01-16 (×4): 2 g via INTRAVENOUS
  Filled 2020-01-13 (×4): qty 20

## 2020-01-13 MED ORDER — ASPIRIN EC 81 MG PO TBEC
81.0000 mg | DELAYED_RELEASE_TABLET | Freq: Every day | ORAL | Status: DC
  Administered 2020-01-14 – 2020-01-16 (×3): 81 mg via ORAL
  Filled 2020-01-13 (×3): qty 1

## 2020-01-13 MED ORDER — INSULIN ASPART 100 UNIT/ML ~~LOC~~ SOLN
0.0000 [IU] | Freq: Three times a day (TID) | SUBCUTANEOUS | Status: DC
  Administered 2020-01-14: 1 [IU] via SUBCUTANEOUS

## 2020-01-13 MED ORDER — INSULIN ASPART 100 UNIT/ML ~~LOC~~ SOLN: 0.0000 [IU] | Freq: Every day | SUBCUTANEOUS | Status: DC

## 2020-01-13 MED ORDER — LIDOCAINE 5 % EX PTCH
1.0000 | MEDICATED_PATCH | Freq: Every day | CUTANEOUS | Status: DC
  Administered 2020-01-14 – 2020-01-16 (×3): 1 via TRANSDERMAL
  Filled 2020-01-13 (×3): qty 1

## 2020-01-13 MED ORDER — LIDOCAINE-PRILOCAINE 2.5-2.5 % EX CREA: 1.0000 "application " | TOPICAL_CREAM | Freq: Every day | CUTANEOUS | Status: DC | PRN

## 2020-01-13 MED ORDER — IPRATROPIUM-ALBUTEROL 0.5-2.5 (3) MG/3ML IN SOLN: 3.0000 mL | RESPIRATORY_TRACT | Status: DC | PRN

## 2020-01-13 MED ORDER — ALTEPLASE 2 MG IJ SOLR
2.0000 mg | Freq: Once | INTRAMUSCULAR | Status: DC | PRN
  Filled 2020-01-13: qty 2

## 2020-01-13 MED ORDER — THIAMINE HCL 100 MG PO TABS
100.0000 mg | ORAL_TABLET | Freq: Every day | ORAL | Status: DC
  Administered 2020-01-14 – 2020-01-16 (×3): 100 mg via ORAL
  Filled 2020-01-13 (×3): qty 1

## 2020-01-13 MED ORDER — PANTOPRAZOLE SODIUM 40 MG PO TBEC
40.0000 mg | DELAYED_RELEASE_TABLET | Freq: Every morning | ORAL | Status: DC
  Administered 2020-01-14 – 2020-01-16 (×3): 40 mg via ORAL
  Filled 2020-01-13 (×3): qty 1

## 2020-01-13 MED ORDER — POLYETHYLENE GLYCOL 3350 17 G PO PACK
17.0000 g | PACK | Freq: Every day | ORAL | Status: DC
  Administered 2020-01-15: 17 g via ORAL
  Filled 2020-01-13 (×3): qty 1

## 2020-01-13 MED ORDER — ACETAMINOPHEN 325 MG PO TABS: 650.0000 mg | ORAL_TABLET | Freq: Four times a day (QID) | ORAL | Status: DC | PRN

## 2020-01-13 MED ORDER — ATORVASTATIN CALCIUM 40 MG PO TABS
80.0000 mg | ORAL_TABLET | Freq: Every day | ORAL | Status: DC
  Administered 2020-01-13 – 2020-01-15 (×3): 80 mg via ORAL
  Filled 2020-01-13 (×3): qty 2

## 2020-01-13 NOTE — Progress Notes (Signed)
[facility] RN called to insert IV for CT angio study. 18 G 1.88 IV placed in left upper forearm, however unable to thread full length of catheter into vein due to tissue scarring on the arm. CT states they are unable to use new IV due to the catheter partially showing, states the radiologist will not allow them to use them when the white plastic is showing. 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 No other viable veins in left arm that would be sufficient for a peripheral IV, upper arm vasculature is deep. Patient is not a candidate for a midline or a PICC line due to ESRD on dialysis. Dr Denton Brick made aware of situation. States he will order a VQ scan tomorrow. Primary RN, Nira Conn, notified.

## 2020-01-13 NOTE — H&P (Signed)
History and Physical  [name] Barnett ZOX:[id number] DOB: 06-23-1980 DOA: 01/13/2020  Referring physician: Merryl Hacker, MD PCP: Julianne Handler, DO  Patient coming from: [facility]   Chief Complaint: Fever  HPI: [name] Barnett is a 39 y.o. female with medical history significant for chronic systolic congestive heart failure (LVEF done on 03/09/2019 showed EF of 45 to 50% with mild decrease function of LV and global moderately reduced systolic function of RV), type 2 diabetes mellitus, end-stage renal disease on HD, GERD, hypertension who is currently going through Rehab at [facility].  Patient complained of about 3-day onset of fever and burning sensation on urination.  She was unable to have dialysis yesterday due to not feeling well.  Patient states that she received Tylenol last night due to fever at her rehab center.  She denies any known sick contact, she denies chest pain, nausea, vomiting, diarrhea.  EMS was activated by [facility] staff, on arrival of EMS, she was noted to be hypoxic with O2 sat in low 80s on room air, so she was placed on supplemental oxygen at three LPM via [place].  Patient recently had a nondisplaced right ankle distal fibula fracture without change in ankle mortise.  ED Course:  In the emergency department, she was febrile with a temperature of 102.76F, tachycardic, tachypneic, patient was also hypoxic and required supplemental oxygen via [place] at 3 PM to maintain O2 sat of 97-100%.  She was noted to be diaphoretic.  Work-up in the ED showed leukocytosis, BUN to creatinine at 91/8.32, albumin 3.4, urinalysis showed proteinuria, large leukocytes, WBC >50. Normocytic anemia.  Chest x-ray showed no active pulmonary disease.  She was treated with IV Vanco and Zosyn, Tylenol was given.  Hospitalist was asked (for further evaluation and management.  Review of Systems: Constitutional: Positive for chills and fever.  HENT: Negative for ear pain and sore  throat.   Eyes: Negative for pain and visual disturbance.  Respiratory: Negative for cough, chest tightness and shortness of breath.   Cardiovascular: Negative for chest pain and palpitations.  Gastrointestinal: Negative for abdominal pain and vomiting.  Endocrine: Negative for polyphagia and polyuria.  Genitourinary: Positive for burning sensation on urination.  Negative for decreased urine volume, hematuria Musculoskeletal: Negative for arthralgias and back pain.  Skin: Negative for color change and rash.  Allergic/Immunologic: Negative for immunocompromised state.  Neurological: Negative for tremors, syncope, speech difficulty, weakness, light-headedness and headaches.  Hematological: Does not bruise/bleed easily.  All other systems reviewed and are negative    Past Medical History:  Diagnosis Date  . Diabetes mellitus without complication ([place])   . Hyperlipidemia   . Hypertension   . Renal disorder   . Stroke ([place])   . Vitamin D deficiency    Past Surgical History:  Procedure Laterality Date  . cardiac stents    . COLOSTOMY    . wound on buttocks      Social History:  reports that she has never smoked. She has never used smokeless tobacco. She reports that she does not drink alcohol and does not use drugs.   Allergies  Allergen Reactions  . Corn-Containing Products   . Lisinopril   . Morphine And Related     History reviewed. No pertinent family history.   Prior to Admission medications   Medication Sig Start Date End Date Taking? Authorizing Provider  oxyCODONE-acetaminophen (PERCOCET) 5-325 MG tablet Take 1 tablet by mouth every 4 (four) hours as needed for severe pain. 01/03/20  Mesner, Corene Cornea, MD    Physical Exam: BP (!) 148/89   Pulse (!) 103   Temp (!) 101.4 F (38.6 C)   Resp (!) 26   Ht 5\' 5"  (1.651 m)   Wt 60 kg   SpO2 100%   BMI 22.01 kg/m   . General: 39 y.o. year-old female ill appearing, diaphoretic, but in no acute distress.  Alert and  oriented x3. Marland Kitchen HEENT: NCAT, EOMI . Neck: Supple, trachea medial . Cardiovascular: Tachycardic.  Regular rate and rhythm with no rubs or gallops.  No thyromegaly or JVD noted.  Right lower extremity edema. 2/4 pulses in all 4 extremities. Marland Kitchen Respiratory: Tachypnea.  Clear to auscultation with no wheezes or rales.  . Abdomen: Soft nontender nondistended with normal bowel sounds x4 quadrants. . Muskuloskeletal: No cyanosis, RLE swelling. . Neuro: CN II-XII intact, strength, sensation, reflexes . Skin: No ulcerative lesions noted or rashes . Psychiatry: Judgement and insight appear normal. Mood is appropriate for condition and setting          Labs on Admission:  Basic Metabolic Panel: Recent Labs  Lab 01/13/20 0219  NA 137  K 4.8  CL 98  CO2 20*  GLUCOSE 95  BUN 91*  CREATININE 8.32*  CALCIUM 9.1   Liver Function Tests: Recent Labs  Lab 01/13/20 0219  AST 13*  ALT 20  ALKPHOS 92  BILITOT 1.2  PROT 7.6  ALBUMIN 3.4*   No results for input(s): LIPASE, AMYLASE in the last 168 hours. No results for input(s): AMMONIA in the last 168 hours. CBC: Recent Labs  Lab 01/13/20 0219  WBC 16.2*  NEUTROABS 14.7*  HGB 8.1*  HCT 25.1*  MCV 94.0  PLT 221   Cardiac Enzymes: No results for input(s): CKTOTAL, CKMB, CKMBINDEX, TROPONINI in the last 168 hours.  BNP (last 3 results) No results for input(s): BNP in the last 8760 hours.  ProBNP (last 3 results) No results for input(s): PROBNP in the last 8760 hours.  CBG: Recent Labs  Lab 01/13/20 0112  GLUCAP 91    Radiological Exams on Admission: DG Chest Port 1 View  Result Date: 01/13/2020 CLINICAL DATA:  Fever and diaphoresis. EXAM: PORTABLE CHEST 1 VIEW COMPARISON:  05/29/2019 FINDINGS: Cardiac enlargement with prominence of the pulmonary outflow tract. Linear scarring or atelectasis in the right mid lung, similar to previous study. No focal consolidation. No pleural effusions. No pneumothorax. IMPRESSION: Cardiac  enlargement. No evidence of active pulmonary disease. Scarring persists in the right middle lung without change. Electronically Signed   By: Lucienne Capers M.D.   On: 01/13/2020 02:44    EKG: I independently viewed the EKG done and my findings are as followed: Sinus tachycardia at rate of 128bpm  Assessment/Plan Present on Admission: **None**  Principal Problem:   Sepsis secondary to UTI [facility]) Active Problems:   Acute respiratory failure with hypoxia (HCC)   Right leg swelling   Sacral decubitus ulcer   Anemia due to end stage renal disease ([place])   Essential hypertension   Hyperlipidemia   Type 2 diabetes mellitus ([place])   Sepsis secondary to UTI Patient presents with leukocytosis, tachycardia, tachypnea and fever (meet SIRS criteria) and she complained of 3-4 days of dysuria, (UTI as source of infection). She was started on IV Vanco and Zosyn; chest x-ray showed no active pulmonary disease, we shall transatrially continue with same antibiotics at this time with plan to de-escalate on vancomycin based on MRSA PCR, procalcitonin, blood culture  Acute respiratory failure  with hypoxia Cause of patient's hypoxia unknown at this time, however, patient's right leg was noted to be swollen (though she recently had a nondisplaced right ankle distal fibula fracture without change in ankle mortise).  It would be reasonable to do lower extremity ultrasound to rule out DVT, D-dimer will also be checked with plan to possibly do VQ scan based on findings to rule out PE  Right leg swelling Lower extremity DVT as described above   Essential hypertension Continue atenolol  Hyperlipidemia Continue home meds when med rec started  Type II DM Blood glucose level normal, no diabetic medication noted on med rec Hemoglobin A1c will be done pending med rec update  Anemia due to ESRD Stable, continue to monitor.  Sacral decubitus ulcer Continue wound care  DVT prophylaxis: Heparin subcu  Code  Status: Full code  Family Communication: None at bedside  Disposition Plan:  Patient is from:                        home Anticipated DC to:                   SNF or family members home Anticipated DC date:               2-3 days Anticipated DC barriers:         Patient unstable at this time due to being septic secondary to UTI and requiring IV antibiotics at this time, patient also requiring supplemental oxygen due to hypoxia   Consults called: None  Admission status: Inpatient    Bernadette Hoit MD [facility] Pager [phone]  If 7PM-7AM, please contact night-coverage [email or web address] Password [facility]  01/13/2020, 7:39 AM

## 2020-01-13 NOTE — ED Notes (Signed)
Pt back from CT, CT states unable to use IV because states IV catheter is coming out.

## 2020-01-13 NOTE — ED Provider Notes (Addendum)
[facility] EMERGENCY DEPARTMENT Provider Note   CSN: [id number] Arrival date & time: 01/13/20  0100     History Chief Complaint  Patient presents with  . Fever    [name] Barnett is a 39 y.o. female.  HPI     This a 39 year old female with a history of diabetes, hypertension, hyperlipidemia, end-stage renal disease, stroke who presents with fever.  Patient currently in rehab at [facility].  Per EMS report, patient noted to have a temperature of 102 last evening.  She had not felt well as she was supposed to dialyze yesterday but did not.  She denies any pain.  Denies any shortness of breath or known sick contacts.  States she generally does not feel well.  Denies chest pain, abdominal pain, nausea, vomiting, diarrhea.  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  She received Tylenol at her living facility with no improvement.  She does make some urine.  Past Medical History:  Diagnosis Date  . Diabetes mellitus without complication ([place])   . Hyperlipidemia   . Hypertension   . Renal disorder   . Stroke ([place])   . Vitamin D deficiency     There are no problems to display for this patient.   Past Surgical History:  Procedure Laterality Date  . cardiac stents    . COLOSTOMY    . wound on buttocks       OB History   No obstetric history on file.     History reviewed. No pertinent family history.  Social History   Tobacco Use  . Smoking status: Never Smoker  . Smokeless tobacco: Never Used  Substance Use Topics  . Alcohol use: Never  . Drug use: Never    Home Medications Prior to Admission medications   Medication Sig Start Date End Date Taking? Authorizing Provider  oxyCODONE-acetaminophen (PERCOCET) 5-325 MG tablet Take 1 tablet by mouth every 4 (four) hours as needed for severe pain. 01/03/20   Mesner, Corene Cornea, MD    Allergies    Corn-containing products, Lisinopril, and Morphine and related  Review of Systems     Review of Systems  Constitutional: Positive for chills and fever.  Respiratory: Negative for cough, shortness of breath and wheezing.   Cardiovascular: Negative for chest pain.  Gastrointestinal: Negative for abdominal pain and nausea.  Genitourinary: Negative for dysuria.  All other systems reviewed and are negative.   Physical Exam Updated Vital Signs BP (!) 157/86   Pulse (!) 117   Temp (!) 102.9 F (39.4 C) (Axillary)   Resp (!) 28   Ht 1.651 m (5\' 5" )   Wt 60 kg   SpO2 99%   BMI 22.01 kg/m   Physical Exam Vitals and nursing note reviewed.  Constitutional:      Appearance: She is well-developed. She is ill-appearing and diaphoretic.  HENT:     Head: Normocephalic and atraumatic.     Mouth/Throat:     Mouth: Mucous membranes are moist.  Eyes:     Pupils: Pupils are equal, round, and reactive to light.  Cardiovascular:     Rate and Rhythm: Regular rhythm. Tachycardia present.     Heart sounds: Normal heart sounds.  Pulmonary:     Effort: Pulmonary effort is normal. No respiratory distress.     Breath sounds: Rhonchi present. No wheezing.  Abdominal:     General: Bowel sounds are normal.     Palpations: Abdomen is soft.  Tenderness: There is no abdominal tenderness.     Comments: Ostomy left mid abdomen, stoma pink  Musculoskeletal:     Cervical back: Neck supple.     Right lower leg: No edema.     Left lower leg: No edema.  Skin:    General: Skin is warm.     Comments: Sacral wound without significant erythema, no induration  Neurological:     Mental Status: She is alert and oriented to person, place, and time.  Psychiatric:        Mood and Affect: Mood normal.     ED Results / Procedures / Treatments   Labs (all labs ordered are listed, but only abnormal results are displayed) Labs Reviewed  COMPREHENSIVE METABOLIC PANEL - Abnormal; Notable for the following components:      Result Value   CO2 20 (*)    BUN 91 (*)    Creatinine, Ser 8.32 (*)     Albumin 3.4 (*)    AST 13 (*)    GFR calc non Af Amer 5 (*)    GFR calc Af Amer 6 (*)    Anion gap 19 (*)    All other components within normal limits  CBC WITH DIFFERENTIAL/PLATELET - Abnormal; Notable for the following components:   WBC 16.2 (*)    RBC 2.67 (*)    Hemoglobin 8.1 (*)    HCT 25.1 (*)    RDW 15.8 (*)    Neutro Abs 14.7 (*)    Abs Immature Granulocytes 0.10 (*)    All other components within normal limits  PROTIME-INR - Abnormal; Notable for the following components:   Prothrombin Time 16.3 (*)    INR 1.4 (*)    All other components within normal limits  URINALYSIS, ROUTINE W REFLEX MICROSCOPIC - Abnormal; Notable for the following components:   Color, Urine AMBER (*)    APPearance CLOUDY (*)    Hgb urine dipstick MODERATE (*)    Protein, ur >=300 (*)    Leukocytes,Ua LARGE (*)    WBC, UA >50 (*)    Bacteria, UA RARE (*)    Non Squamous Epithelial 0-5 (*)    All other components within normal limits  APTT - Abnormal; Notable for the following components:   aPTT 42 (*)    All other components within normal limits  CULTURE, BLOOD (ROUTINE X 2)  CULTURE, BLOOD (ROUTINE X 2)  SARS CORONAVIRUS 2 BY RT PCR (HOSPITAL ORDER, [place] [facility])  URINE CULTURE  LACTIC ACID, PLASMA  LACTIC ACID, PLASMA  CBG MONITORING, ED  POC URINE PREG, ED  POC URINE PREG, ED    EKG EKG Interpretation  Date/Time:  Sunday January 13 2020 01:24:38 EDT Ventricular Rate:  128 PR Interval:    QRS Duration: 71 QT Interval:  322 QTC Calculation: 470 R Axis:   5 Text Interpretation: Sinus tachycardia Low voltage, extremity and precordial leads Confirmed by Thayer Jew [phone]) on 01/13/2020 3:01:20 AM   Radiology DG Chest Port 1 View  Result Date: 01/13/2020 CLINICAL DATA:  Fever and diaphoresis. EXAM: PORTABLE CHEST 1 VIEW COMPARISON:  05/29/2019 FINDINGS: Cardiac enlargement with prominence of the pulmonary outflow tract. Linear scarring or  atelectasis in the right mid lung, similar to previous study. No focal consolidation. No pleural effusions. No pneumothorax. IMPRESSION: Cardiac enlargement. No evidence of active pulmonary disease. Scarring persists in the right middle lung without change. Electronically Signed   By: Lucienne Capers M.D.   On:  01/13/2020 02:44    Procedures .Critical Care Performed by: Merryl Hacker, MD Authorized by: Merryl Hacker, MD   Critical care provider statement:    Critical care time (minutes):  45   Critical care was necessary to treat or prevent imminent or life-threatening deterioration of the following conditions:  Sepsis   Critical care was time spent personally by me on the following activities:  Discussions with consultants, evaluation of patient's response to treatment, examination of patient, ordering and performing treatments and interventions, ordering and review of laboratory studies, ordering and review of radiographic studies, pulse oximetry, re-evaluation of patient's condition, obtaining history from patient or surrogate and review of old charts   I assumed direction of critical care for this patient from another provider in my specialty: no     (including critical care time)  Angiocath insertion Performed by: Merryl Hacker  Consent: Verbal consent obtained. Risks and benefits: risks, benefits and alternatives were discussed Time out: Immediately prior to procedure a "time out" was called to verify the correct patient, procedure, equipment, support staff and site/side marked as required.  Preparation: Patient was prepped and draped in the usual sterile fashion.  Vein Location: left EJ   Gauge: 18  Normal blood return and flush without difficulty Patient tolerance: Patient tolerated the procedure well with no immediate complications.    Medications Ordered in ED Medications  vancomycin (VANCOCIN) IVPB 1000 mg/200 mL premix (1,000 mg Intravenous New  Bag/Given 01/13/20 0250)  piperacillin-tazobactam (ZOSYN) IVPB 3.375 g (0 g Intravenous Stopped 01/13/20 0324)  acetaminophen (TYLENOL) tablet 1,000 mg (1,000 mg Oral Given 01/13/20 5465)    ED Course  I have reviewed the triage vital signs and the nursing notes.  Pertinent labs & imaging results that were available during my care of the patient were reviewed by me and considered in my medical decision making (see chart for details).    MDM Rules/Calculators/A&P                           Patient presents with fever.  Chronically ill and on dialysis.  Does not have any significant complaints.  Denies respiratory or urinary symptoms.  Does have a chronic sacral wound although this feels well-healing.  Vital signs notable for temperature of 102.9.  Tachycardia in the 130s and tachypnea.  She is ill-appearing but nontoxic.  She is overdue for dialysis.  We will hold aggressive fluid resuscitation unless blood pressure drops or lactate is greater than 4.  She was given broad-spectrum antibiotics with concern for sepsis.  She was given vancomycin and Zosyn.  Work-up notable for leukocytosis.  Lactate is 1 and blood pressure has remained stable.  Do not feel she needs aggressive fluid resuscitation at this time.  She was given Tylenol for her temperature.  Urinalysis shows large leukocyte esterase, greater than 50 white cells and bacteria.  Suspect urinary source.  Will plan for admission to the hospital.  Final Clinical Impression(s) / ED Diagnoses Final diagnoses:  Severe sepsis ([place])  Upper urinary tract infection    Rx / DC Orders ED Discharge Orders    None       Refoel Palladino, Barbette Hair, MD 01/13/20 6812    Merryl Hacker, MD 01/13/20 [phone]

## 2020-01-13 NOTE — ED Notes (Signed)
Pt colostomy bag changed

## 2020-01-13 NOTE — ED Triage Notes (Addendum)
Pt from [facility] with fever of 102.  Per EMS, pt was treated with Tylenol at 2000 last night. Pt also HD pt but missed her appt yesterday. Pt diaphoretic upon arrival. Blood glucose here was 91. Pt also requiring O2. EMS states pt's O2 was low 80's on room air so pt placed on O2@3L  [place].

## 2020-01-13 NOTE — Consult Note (Signed)
WOC Nurse Consult Note: Reason for Consult: Pressure injury (Stage 3) located in an area of previous pressure injury healing (scar tissue) at gluteal cleft/coccygeal area. Wound type:Pressure plus moisture Pressure Injury POA: Yes Measurement:To be obtained by Bedside RN with first dressing change today and documented on Nursing Flow Sheet. Wound bed:80% pink, 20% thin yellow tissue (fibrinous slough) Drainage (amount, consistency, odor) Small amount of serous drainage Periwound: with evidence of previous wound healing (scarring) Dressing procedure/placement/frequency: I have provided Nursing with guidance for the topical care of this lesion using xeroform gauze topped with dry gauze and covered with a silicone foam dressing for the sacrum. The xeroform is to be changed daily,. The silicone foam may be changed up to every 3 days, but PRN for rolling of dressing edges and soiling. The patient is to be turned from side to side and time in the supine position minimized.  It is noted that the patient recently started hemodialysis; she is provided with a pressure redistribution chair pad for times when she is OOB in the chair for prolonged periods of time (>30 minutes) with limited opportunity for repositioning.  [facility] nursing team will not follow, but will remain available to this patient, the nursing and medical teams.  Please re-consult if needed. Thanks, Maudie Flakes, MSN, RN, [place], Arther Abbott  Pager# [phone]

## 2020-01-13 NOTE — Progress Notes (Addendum)
Patient Demographics:    [name] Barnett, is a 39 y.o. female, DOB - [birth year], AOZ:[id number]  Admit date - 01/13/2020   Admitting Physician Bernadette Hoit, DO  Outpatient Primary MD for the patient is Sitafalwalla, Sharmin, DO  LOS - 0   Chief Complaint  Patient presents with  . Fever        Subjective:    [name] Barnett today has no emesis,  No chest pain,  T max 102.9, T current 100.2 shob and hypoxia persist  Assessment  & Plan :    Principal Problem:   Sepsis secondary to UTI [facility]) Active Problems:   Acute respiratory failure with hypoxia (HCC)   Right leg swelling   Sacral decubitus ulcer   Anemia due to end stage renal disease (HCC)   Essential hypertension   Hyperlipidemia   Type 2 diabetes mellitus ([place])  Brief Summary:- 39 y.o. female with medical history significant for chronic systolic congestive heart failure (LVEF done on 03/09/2019 showed EF of 45 to 50% with mild decrease function of LV and global moderately reduced systolic function of RV), type 2 diabetes mellitus, end-stage renal disease on HD, GERD, hypertension admitted from [facility] on 01/13/2020 with concerns for sepsis from presumed urinary source   A/p 1)Sepsis secondary to Presumed urinary source  --Patient met sepsis criteria on admission please see for H&P from Dr. Josephine Cables  -Hypoxia Noted -PCT 14.64, lactic acid is not elevated -Patient initially received Vanco and Zosyn, okay to de-escalate to Rocephin pending further culture data -- WBC 16.2  2)Acute respiratory failure with hypoxia---etiology yet to be established - D-dimer 3.56 -Lower extremity venous Doppler without DVT --Chest x-ray without pneumonia CTA chest pending -Check CTA chest to rule out PE - D/w Nephrologist Dr. Rolanda Lundborg to do CTA chest today and do HD in am on 01/14/20) -Continue supplemental oxygen and  Bronchodilators  3)ESRD---TTS---  missed HD on 01/12/2020 Discussed with Dr Hollie Salk (Nephrologist) --Plan is for HD on 01/14/2020  4)Sacral decubitus ulcer --- Please see photos in epic, per patient wounds are- much improved compared to previous photos  -Wound care consult requested  5)Essential hypertension Continue atenolol  6)Hyperlipidemia/DM2--- Discussed with pharmacist med rec still NOT available from [facility] Use Novolog/Humalog Sliding scale insulin with Accu-Cheks/Fingersticks as ordered   7)Anemia due to ESRD --Hgb 8.1 --- which is not far from patient's usual baseline, Procrit/ESA agent as per nephrology team  8)Non-displaced right ankle distal fibula fracture -----orthopedic follow-up post discharge  Total care time >> 38 mins   DVT prophylaxis: Heparin subcu  Code Status: Full code  Family Communication:  Discussed with Stephens November [phone] Unable to reach Harrold Donath at [phone]  Disposition Plan:  Patient is from:[place] Anticipated DC to:SNF  Anticipated DC date:2-3 days Anticipated DC barriers:Patient unstable at this time due to being septic secondary to UTI and requiring IV antibiotics at this time, patient also requiring supplemental oxygen due to hypoxia  DVT Prophylaxis  :    - Heparin -    Lab Results  Component Value Date   PLT 221 01/13/2020    Inpatient Medications  Scheduled Meds: . atenolol  12.5 mg Oral Daily  . [START ON 01/14/2020] Chlorhexidine Gluconate Cloth  6  each Topical Q0600  . heparin  5,000 Units Subcutaneous Q8H   Continuous Infusions: . sodium chloride    . sodium chloride    . cefTRIAXone (ROCEPHIN)  IV Stopped (01/13/20 0930)   PRN Meds:.sodium chloride, sodium chloride, acetaminophen, alteplase, heparin, lidocaine (PF), lidocaine-prilocaine, pentafluoroprop-tetrafluoroeth    Anti-infectives (From admission,  onward)   Start     Dose/Rate Route Frequency Ordered Stop   01/13/20 1000  cefTRIAXone (ROCEPHIN) 2 g in sodium chloride 0.9 % 100 mL IVPB        2 g 200 mL/hr over 30 Minutes Intravenous Daily 01/13/20 0805     01/13/20 0200  vancomycin (VANCOCIN) IVPB 1000 mg/200 mL premix        1,000 mg 200 mL/hr over 60 Minutes Intravenous  Once 01/13/20 0151 01/13/20 0350   01/13/20 0200  piperacillin-tazobactam (ZOSYN) IVPB 3.375 g        3.375 g 100 mL/hr over 30 Minutes Intravenous  Once 01/13/20 0151 01/13/20 0324        Objective:   Vitals:   01/13/20 1230 01/13/20 1300 01/13/20 1400 01/13/20 1430  BP: (!) 158/89 (!) 157/94 (!) 165/103 (!) 166/95  Pulse: 96 94 94 93  Resp: (!) 24 (!) 29 (!) 28 (!) 29  Temp:      TempSrc:      SpO2: 93% 95% 96% 95%  Weight:      Height:        Wt Readings from Last 3 Encounters:  01/13/20 60 kg  05/29/19 59 kg    Intake/Output Summary (Last 24 hours) at 01/13/2020 1500 Last data filed at 01/13/2020 0930 Gross per 24 hour  Intake 140.67 ml  Output --  Net 140.67 ml   Physical Exam  Gen:- Awake Alert,  In no apparent distress  HEENT:- [place].AT, No sclera icterus Nose- [place] 2L/min Neck-Supple Neck,No JVD,.  Lungs-diminished breath sounds bilaterally, no wheezing  CV- S1, S2 normal, regular, tachycardic Abd-  +ve B.Sounds, Abd Soft, No tenderness, left lower quadrant ostomy bag with fecal material    Extremity:-Right lower extremity swelling, pedal pulses present  Psych-affect is appropriate, oriented x3 Neuro-generalized weakness, old, residual deficits from prior stroke  -no new focal deficits, no tremors MSK-right upper extremity AV graft Skin--- sacral decubitus --please see photos in EPic   Data Review:   Micro Results Recent Results (from the past 240 hour(s))  SARS Coronavirus 2 by RT PCR (hospital order, performed in [facility] hospital lab) Nasopharyngeal Nasopharyngeal Swab     Status: None   Collection Time: 01/13/20  1:50 AM    Specimen: Nasopharyngeal Swab  Result Value Ref Range Status   SARS Coronavirus 2 NEGATIVE NEGATIVE Final    Comment: (NOTE) SARS-CoV-2 target nucleic acids are NOT DETECTED.  The SARS-CoV-2 RNA is generally detectable in upper and lower respiratory specimens during the acute phase of infection. The lowest concentration of SARS-CoV-2 viral copies this assay can detect is 250 copies / mL. A negative result does not preclude SARS-CoV-2 infection and should not be used as the sole basis for treatment or other patient management decisions.  A negative result may occur with improper specimen collection / handling, submission of specimen other than nasopharyngeal swab, presence of viral mutation(s) within the areas targeted by this assay, and inadequate number of viral copies (<250 copies / mL). A negative result must be combined with clinical observations, patient history, and epidemiological information.  Fact Sheet for Patients:   StrictlyIdeas.no  Fact Sheet for Healthcare  Providers: BankingDealers.co.za  This test is not yet approved or  cleared by the [place] and has been authorized for detection and/or diagnosis of SARS-CoV-2 by FDA under an Emergency Use Authorization (EUA).  This EUA will remain in effect (meaning this test can be used) for the duration of the COVID-19 declaration under Section 564(b)(1) of the Act, 21 U.S.C. section 360bbb-3(b)(1), unless the authorization is terminated or revoked sooner.  Performed at [facility], [address] [place], [place] [address]   Culture, blood (Routine x 2)     Status: None (Preliminary result)   Collection Time: 01/13/20  2:19 AM   Specimen: Blood  Result Value Ref Range Status   Specimen Description NECK  Final   Special Requests   Final    BOTTLES DRAWN AEROBIC ONLY Blood Culture adequate volume Performed at [facility], [address], [place], [place]  [address]    Culture PENDING  Incomplete   Report Status PENDING  Incomplete  Culture, blood (Routine x 2)     Status: None (Preliminary result)   Collection Time: 01/13/20  2:23 AM   Specimen: Blood  Result Value Ref Range Status   Specimen Description NECK  Final   Special Requests   Final    BOTTLES DRAWN AEROBIC ONLY Blood Culture adequate volume Performed at [facility], [address] [place], [place] [address]    Culture PENDING  Incomplete   Report Status PENDING  Incomplete    Radiology Reports US Venous Img Lower Bilateral (DVT)  Result Date: 01/13/2020 CLINICAL DATA:  Bilateral lower extremity edema EXAM: BILATERAL LOWER EXTREMITY VENOUS DUPLEX ULTRASOUND TECHNIQUE: Gray-scale sonography with graded compression, as well as color Doppler and duplex ultrasound were performed to evaluate the lower extremity deep venous systems from the level of the common femoral vein and including the common femoral, femoral, profunda femoral, popliteal and calf veins including the posterior tibial, peroneal and gastrocnemius veins when visible. The superficial great saphenous vein was also interrogated. Spectral Doppler was utilized to evaluate flow at rest and with distal augmentation maneuvers in the common femoral, femoral and popliteal veins. COMPARISON:  None. FINDINGS: RIGHT LOWER EXTREMITY Common Femoral Vein: No evidence of thrombus. Normal compressibility, respiratory phasicity and response to augmentation. Saphenofemoral Junction: No evidence of thrombus. Normal compressibility and flow on color Doppler imaging. Profunda Femoral Vein: No evidence of thrombus. Normal compressibility and flow on color Doppler imaging. Femoral Vein: No evidence of thrombus. Normal compressibility, respiratory phasicity and response to augmentation. Popliteal Vein: No evidence of thrombus. Normal compressibility, respiratory phasicity and response to augmentation. Calf Veins: No evidence of thrombus. Normal  compressibility and flow on color Doppler imaging. Superficial Great Saphenous Vein: No evidence of thrombus. Normal compressibility. Venous Reflux:  None. Other Findings:  None. LEFT LOWER EXTREMITY Common Femoral Vein: No evidence of thrombus. Normal compressibility, respiratory phasicity and response to augmentation. Saphenofemoral Junction: No evidence of thrombus. Normal compressibility and flow on color Doppler imaging. Profunda Femoral Vein: No evidence of thrombus. Normal compressibility and flow on color Doppler imaging. Femoral Vein: No evidence of thrombus. Normal compressibility, respiratory phasicity and response to augmentation. Popliteal Vein: No evidence of thrombus. Normal compressibility, respiratory phasicity and response to augmentation. Calf Veins: No evidence of thrombus. Normal compressibility and flow on color Doppler imaging. Superficial Great Saphenous Vein: No evidence of thrombus. Normal compressibility. Venous Reflux:  None. Other Findings:  None. IMPRESSION: No evidence of deep venous thrombosis in either lower extremity. Electronically Signed   By: Gwyndolyn Saxon  Jasmine December III M.D.   On: 01/13/2020 10:24   DG Chest Port 1 View  Result Date: 01/13/2020 CLINICAL DATA:  Fever and diaphoresis. EXAM: PORTABLE CHEST 1 VIEW COMPARISON:  05/29/2019 FINDINGS: Cardiac enlargement with prominence of the pulmonary outflow tract. Linear scarring or atelectasis in the right mid lung, similar to previous study. No focal consolidation. No pleural effusions. No pneumothorax. IMPRESSION: Cardiac enlargement. No evidence of active pulmonary disease. Scarring persists in the right middle lung without change. Electronically Signed   By: Lucienne Capers M.D.   On: 01/13/2020 02:44     CBC Recent Labs  Lab 01/13/20 0219  WBC 16.2*  HGB 8.1*  HCT 25.1*  PLT 221  MCV 94.0  MCH 30.3  MCHC 32.3  RDW 15.8*  LYMPHSABS 0.8  MONOABS 0.5  EOSABS 0.0  BASOSABS 0.1    Chemistries  Recent Labs  Lab  01/13/20 0219  NA 137  K 4.8  CL 98  CO2 20*  GLUCOSE 95  BUN 91*  CREATININE 8.32*  CALCIUM 9.1  AST 13*  ALT 20  ALKPHOS 92  BILITOT 1.2   ------------------------------------------------------------------------------------------------------------------ No results for input(s): CHOL, HDL, LDLCALC, TRIG, CHOLHDL, LDLDIRECT in the last 72 hours.  Lab Results  Component Value Date   HGBA1C 6.2 (H) 02/08/2019   ------------------------------------------------------------------------------------------------------------------ No results for input(s): TSH, T4TOTAL, T3FREE, THYROIDAB in the last 72 hours.  Invalid input(s): FREET3 ------------------------------------------------------------------------------------------------------------------ No results for input(s): VITAMINB12, FOLATE, FERRITIN, TIBC, IRON, RETICCTPCT in the last 72 hours.  Coagulation profile Recent Labs  Lab 01/13/20 0219  INR 1.4*    Recent Labs    01/13/20 0219  DDIMER 3.56*    Cardiac Enzymes No results for input(s): CKMB, TROPONINI, MYOGLOBIN in the last 168 hours.  Invalid input(s): CK ------------------------------------------------------------------------------------------------------------------ No results found for: BNP   Roxan Hockey M.D on 01/13/2020 at 3:00 PM  Go to [email or web address] - for contact info  [facility] - Office  [phone]

## 2020-01-14 ENCOUNTER — Inpatient Hospital Stay (HOSPITAL_COMMUNITY): Payer: Medicare Other

## 2020-01-14 ENCOUNTER — Ambulatory Visit: Payer: Medicare Other | Admitting: Physician Assistant

## 2020-01-14 LAB — COMPREHENSIVE METABOLIC PANEL
ALT: 38 U/L (ref 0–44)
AST: 13 U/L — ABNORMAL LOW (ref 15–41)
Albumin: 3 g/dL — ABNORMAL LOW (ref 3.5–5.0)
Alkaline Phosphatase: 97 U/L (ref 38–126)
Anion gap: 18 — ABNORMAL HIGH (ref 5–15)
BUN: 114 mg/dL — ABNORMAL HIGH (ref 6–20)
CO2: 21 mmol/L — ABNORMAL LOW (ref 22–32)
Calcium: 8.8 mg/dL — ABNORMAL LOW (ref 8.9–10.3)
Chloride: 95 mmol/L — ABNORMAL LOW (ref 98–111)
Creatinine, Ser: 10.09 mg/dL — ABNORMAL HIGH (ref 0.44–1.00)
GFR calc Af Amer: 5 mL/min — ABNORMAL LOW (ref >60)
GFR calc non Af Amer: 4 mL/min — ABNORMAL LOW (ref >60)
Glucose, Bld: 123 mg/dL — ABNORMAL HIGH (ref 70–99)
Potassium: 4.9 mmol/L (ref 3.5–5.1)
Sodium: 134 mmol/L — ABNORMAL LOW (ref 135–145)
Total Bilirubin: 0.6 mg/dL (ref 0.3–1.2)
Total Protein: 6.9 g/dL (ref 6.5–8.1)

## 2020-01-14 LAB — POC URINE PREG, ED: Preg Test, Ur: NEGATIVE

## 2020-01-14 LAB — PROTIME-INR
INR: 1.3 — ABNORMAL HIGH (ref 0.8–1.2)
Prothrombin Time: 15.9 seconds — ABNORMAL HIGH (ref 11.4–15.2)

## 2020-01-14 LAB — CBG MONITORING, ED
Glucose-Capillary: 157 mg/dL — ABNORMAL HIGH (ref 70–99)
Glucose-Capillary: 113 mg/dL — ABNORMAL HIGH (ref 70–99)
Glucose-Capillary: 114 mg/dL — ABNORMAL HIGH (ref 70–99)

## 2020-01-14 LAB — CBC
HCT: 24.7 % — ABNORMAL LOW (ref 36.0–46.0)
Hemoglobin: 7.8 g/dL — ABNORMAL LOW (ref 12.0–15.0)
MCH: 29.8 pg (ref 26.0–34.0)
MCHC: 31.6 g/dL (ref 30.0–36.0)
MCV: 94.3 fL (ref 80.0–100.0)
Platelets: 225 10*3/uL (ref 150–400)
RBC: 2.62 MIL/uL — ABNORMAL LOW (ref 3.87–5.11)
RDW: 15.9 % — ABNORMAL HIGH (ref 11.5–15.5)
WBC: 12 10*3/uL — ABNORMAL HIGH (ref 4.0–10.5)
nRBC: 0 % (ref 0.0–0.2)

## 2020-01-14 LAB — URINE CULTURE

## 2020-01-14 LAB — APTT: aPTT: 32 seconds (ref 24–36)

## 2020-01-14 LAB — GLUCOSE, CAPILLARY: Glucose-Capillary: 88 mg/dL (ref 70–99)

## 2020-01-14 MED ORDER — ALUM & MAG HYDROXIDE-SIMETH 200-200-20 MG/5ML PO SUSP
30.0000 mL | Freq: Four times a day (QID) | ORAL | Status: DC | PRN
  Administered 2020-01-14: 30 mL via ORAL
  Filled 2020-01-14: qty 30

## 2020-01-14 MED ORDER — IOHEXOL 350 MG/ML SOLN
100.0000 mL | Freq: Once | INTRAVENOUS | Status: AC | PRN
  Administered 2020-01-14: 100 mL via INTRAVENOUS

## 2020-01-14 NOTE — ED Notes (Signed)
ED TO INPATIENT HANDOFF REPORT  ED Nurse Name and Phone #:   S Name/Age/Gender [name] Barnett 39 y.o. female Room/Bed: APA11/APA11  Code Status   Code Status: Full Code  Home/SNF/Other Skilled nursing facility Patient oriented to: self, place, time and situation Is this baseline? Yes   Triage Complete: Triage complete  Chief Complaint Sepsis [facility]) [A41.9]  Triage Note Pt from [facility] with fever of 102.  Per EMS, pt was treated with Tylenol at 2000 last night. Pt also HD pt but missed her appt yesterday. Pt diaphoretic upon arrival. Blood glucose here was 91. Pt also requiring O2. EMS states pt's O2 was low 80's on room air so pt placed on O2@3L  [place].    Allergies Allergies  Allergen Reactions  . Corn-Containing Products   . Lisinopril   . Morphine And Related     Level of Care/Admitting Diagnosis ED Disposition    ED Disposition Condition [facility] Area: [facility] [[id number]]  Level of Care: Med-Surg [16]  Covid Evaluation: Confirmed COVID Negative  Diagnosis: Sepsis [facility]) [[id number]]  Admitting Physician: Bernadette Hoit [[id number]]  Attending Physician: Bernadette Hoit [[id number]]  Estimated length of stay: past midnight tomorrow  Certification:: I certify this patient will need inpatient services for at least 2 midnights       B Medical/Surgery History Past Medical History:  Diagnosis Date  . Diabetes mellitus without complication ([place])   . Hyperlipidemia   . Hypertension   . Renal disorder   . Stroke ([place])   . Vitamin D deficiency    Past Surgical History:  Procedure Laterality Date  . cardiac stents    . COLOSTOMY    . wound on buttocks       A IV Location/Drains/Wounds Patient Lines/Drains/Airways Status    Active Line/Drains/Airways    Name Placement date Placement time Site Days   Peripheral IV 01/13/20 Anterior;Left External jugular 01/13/20  0230  External jugular  1   Peripheral IV 01/14/20 Left;Upper  Arm 01/14/20  0403  Arm  less than 1          Intake/Output Last 24 hours No intake or output data in the 24 hours ending 01/14/20 1418  Labs/Imaging Results for orders placed or performed during the hospital encounter of 01/13/20 (from the past 48 hour(s))  CBG monitoring, ED     Status: None   Collection Time: 01/13/20  1:12 AM  Result Value Ref Range   Glucose-Capillary 91 70 - 99 mg/dL    Comment: Glucose reference range applies only to samples taken after fasting for at least 8 hours.  SARS Coronavirus 2 by RT PCR (hospital order, performed in [facility] hospital lab) Nasopharyngeal Nasopharyngeal Swab     Status: None   Collection Time: 01/13/20  1:50 AM   Specimen: Nasopharyngeal Swab  Result Value Ref Range   SARS Coronavirus 2 NEGATIVE NEGATIVE    Comment: (NOTE) SARS-CoV-2 target nucleic acids are NOT DETECTED.  The SARS-CoV-2 RNA is generally detectable in upper and lower respiratory specimens during the acute phase of infection. The lowest concentration of SARS-CoV-2 viral copies this assay can detect is 250 copies / mL. A negative result does not preclude SARS-CoV-2 infection and should not be used as the sole basis for treatment or other patient management decisions.  A negative result may occur with improper specimen collection / handling, submission of specimen other than nasopharyngeal swab, presence of viral mutation(s) within the areas targeted by this assay,  and inadequate number of viral copies (<250 copies / mL). A negative result must be combined with clinical observations, patient history, and epidemiological information.  Fact Sheet for Patients:   StrictlyIdeas.no  Fact Sheet for Healthcare Providers: BankingDealers.co.za  This test is not yet approved or  cleared by the [place] FDA and has been authorized for detection and/or diagnosis of SARS-CoV-2 by FDA under an Emergency Use Authorization  (EUA).  This EUA will remain in effect (meaning this test can be used) for the duration of the COVID-19 declaration under Section 564(b)(1) of the Act, 21 U.S.C. section 360bbb-3(b)(1), unless the authorization is terminated or revoked sooner.  Performed at [facility], [address] [place], [place] [address]   Comprehensive metabolic panel     Status: Abnormal   Collection Time: 01/13/20  2:19 AM  Result Value Ref Range   Sodium 137 135 - 145 mmol/L   Potassium 4.8 3.5 - 5.1 mmol/L   Chloride 98 98 - 111 mmol/L   CO2 20 (L) 22 - 32 mmol/L   Glucose, Bld 95 70 - 99 mg/dL    Comment: Glucose reference range applies only to samples taken after fasting for at least 8 hours.   BUN 91 (H) 6 - 20 mg/dL   Creatinine, Ser 8.32 (H) 0.44 - 1.00 mg/dL   Calcium 9.1 8.9 - 10.3 mg/dL   Total Protein 7.6 6.5 - 8.1 g/dL   Albumin 3.4 (L) 3.5 - 5.0 g/dL   AST 13 (L) 15 - 41 U/L   ALT 20 0 - 44 U/L   Alkaline Phosphatase 92 38 - 126 U/L   Total Bilirubin 1.2 0.3 - 1.2 mg/dL   GFR calc non Af Amer 5 (L) >60 mL/min   GFR calc Af Amer 6 (L) >60 mL/min   Anion gap 19 (H) 5 - 15    Comment: Performed at [facility], [address] [place], [place] [address]  Lactic acid, plasma     Status: None   Collection Time: 01/13/20  2:19 AM  Result Value Ref Range   Lactic Acid, Venous 1.1 0.5 - 1.9 mmol/L    Comment: Performed at [facility], [address], [place], [place] [address]  CBC with Differential     Status: Abnormal   Collection Time: 01/13/20  2:19 AM  Result Value Ref Range   WBC 16.2 (H) 4.0 - 10.5 K/uL   RBC 2.67 (L) 3.87 - 5.11 MIL/uL   Hemoglobin 8.1 (L) 12.0 - 15.0 g/dL   HCT 25.1 (L) 36 - 46 %   MCV 94.0 80.0 - 100.0 fL   MCH 30.3 26.0 - 34.0 pg   MCHC 32.3 30.0 - 36.0 g/dL   RDW 15.8 (H) 11.5 - 15.5 %   Platelets 221 150 - 400 K/uL   nRBC 0.0 0.0 - 0.2 %   Neutrophils Relative % 91 %   Neutro Abs 14.7 (H) 1.7 - 7.7 K/uL   Lymphocytes Relative 5 %   Lymphs Abs 0.8 0.7 -  4.0 K/uL   Monocytes Relative 3 %   Monocytes Absolute 0.5 0 - 1 K/uL   Eosinophils Relative 0 %   Eosinophils Absolute 0.0 0 - 0 K/uL   Basophils Relative 0 %   Basophils Absolute 0.1 0 - 0 K/uL   Immature Granulocytes 1 %   Abs Immature Granulocytes 0.10 (H) 0.00 - 0.07 K/uL    Comment: Performed at [facility], [address], [place], [place] [address]  Protime-INR     Status: Abnormal   Collection Time: 01/13/20  2:19 AM  Result Value Ref Range   Prothrombin Time 16.3 (H) 11.4 - 15.2 seconds   INR 1.4 (H) 0.8 - 1.2    Comment: (NOTE) INR goal varies based on device and disease states. Performed at [facility], [address], [place], [place] [address]   Culture, blood (Routine x 2)     Status: None (Preliminary result)   Collection Time: 01/13/20  2:19 AM   Specimen: Blood  Result Value Ref Range   Specimen Description NECK    Special Requests      BOTTLES DRAWN AEROBIC ONLY Blood Culture adequate volume Performed at [facility], [address], [place], [place] [address]    Culture PENDING    Report Status PENDING   APTT     Status: Abnormal   Collection Time: 01/13/20  2:19 AM  Result Value Ref Range   aPTT 42 (H) 24 - 36 seconds    Comment:        IF BASELINE aPTT IS ELEVATED, SUGGEST PATIENT RISK ASSESSMENT BE USED TO DETERMINE APPROPRIATE ANTICOAGULANT THERAPY. Performed at [facility], [address] [place], [place] [address]   D-dimer, quantitative (not at [facility])     Status: Abnormal   Collection Time: 01/13/20  2:19 AM  Result Value Ref Range   D-Dimer, Quant 3.56 (H) 0.00 - 0.50 ug/mL-FEU    Comment: (NOTE) At the manufacturer cut-off of 0.50 ug/mL FEU, this assay has been documented to exclude PE with a sensitivity and negative predictive value of 97 to 99%.  At this time, this assay has not been approved by the FDA to exclude DVT/VTE. Results should be correlated with clinical presentation. Performed at [facility], [address]  [place], [place] [address]   Hemoglobin A1c     Status: Abnormal   Collection Time: 01/13/20  2:19 AM  Result Value Ref Range   Hgb A1c MFr Bld 7.1 (H) 4.8 - 5.6 %    Comment: (NOTE) Pre diabetes:          5.7%-6.4%  Diabetes:              >6.4%  Glycemic control for   <7.0% adults with diabetes    Mean Plasma Glucose 157.07 mg/dL    Comment: Performed at [facility] [address], [place], [place] [address]  Culture, blood (Routine x 2)     Status: None (Preliminary result)   Collection Time: 01/13/20  2:23 AM   Specimen: Blood  Result Value Ref Range   Specimen Description NECK    Special Requests      BOTTLES DRAWN AEROBIC ONLY Blood Culture adequate volume Performed at [facility], [address] [place], [place] [address]    Culture PENDING    Report Status PENDING   Urinalysis, Routine w reflex microscopic Urine, Clean Catch     Status: Abnormal   Collection Time: 01/13/20  2:54 AM  Result Value Ref Range   Color, Urine AMBER (A) YELLOW    Comment: BIOCHEMICALS MAY BE AFFECTED BY COLOR   APPearance CLOUDY (A) CLEAR   Specific Gravity, Urine 1.014 1.005 - 1.030   pH 7.0 5.0 - 8.0   Glucose, UA NEGATIVE NEGATIVE mg/dL   Hgb urine dipstick MODERATE (A) NEGATIVE   Bilirubin Urine NEGATIVE NEGATIVE   Ketones, ur NEGATIVE NEGATIVE mg/dL   Protein, ur >=300 (A) NEGATIVE mg/dL   Nitrite NEGATIVE NEGATIVE  Leukocytes,Ua LARGE (A) NEGATIVE   RBC / HPF 11-20 0 - 5 RBC/hpf   WBC, UA >50 (H) 0 - 5 WBC/hpf   Bacteria, UA RARE (A) NONE SEEN   Squamous Epithelial / LPF 21-50 0 - 5   WBC Clumps PRESENT    Non Squamous Epithelial 0-5 (A) NONE SEEN    Comment: Performed at [facility], [address] [place], [place] [address]  Urine culture     Status: Abnormal   Collection Time: 01/13/20  2:54 AM   Specimen: In/Out Cath Urine  Result Value Ref Range   Specimen Description      IN/OUT CATH URINE Performed at [facility], [address], [place], [place] [address]     Special Requests      NONE Performed at [facility], [address], [place], [place] [address]    Culture MULTIPLE SPECIES PRESENT, SUGGEST RECOLLECTION (A)    Report Status 01/14/2020 FINAL   POC urine preg, ED     Status: None   Collection Time: 01/13/20  2:59 AM  Result Value Ref Range   Preg Test, Ur NEGATIVE NEGATIVE    Comment:        THE SENSITIVITY OF THIS METHODOLOGY IS >24 mIU/mL   Lactic acid, plasma     Status: None   Collection Time: 01/13/20  4:38 AM  Result Value Ref Range   Lactic Acid, Venous 0.8 0.5 - 1.9 mmol/L    Comment: Performed at [facility], [address], [place],  [address]  Procalcitonin     Status: None   Collection Time: 01/13/20 11:58 AM  Result Value Ref Range   Procalcitonin 14.64 ng/mL    Comment:        Interpretation: PCT >= 10 ng/mL: Important systemic inflammatory response, almost exclusively due to severe bacterial sepsis or septic shock. (NOTE)       Sepsis PCT Algorithm           Lower Respiratory Tract                                      Infection PCT Algorithm    ----------------------------     ----------------------------         PCT < 0.25 ng/mL                PCT < 0.10 ng/mL          Strongly encourage             Strongly discourage   discontinuation of antibiotics    initiation of antibiotics    ----------------------------     -----------------------------       PCT 0.25 - 0.50 ng/mL            PCT 0.10 - 0.25 ng/mL               OR       >80% decrease in PCT            Discourage initiation of                                            antibiotics      Encourage discontinuation           of antibiotics    ----------------------------     -----------------------------  PCT >= 0.50 ng/mL              PCT 0.26 - 0.50 ng/mL                AND       <80% decrease in PCT             Encourage initiation of                                             antibiotics       Encourage continuation           of  antibiotics    ----------------------------     -----------------------------        PCT >= 0.50 ng/mL                  PCT > 0.50 ng/mL               AND         increase in PCT                  Strongly encourage                                      initiation of antibiotics    Strongly encourage escalation           of antibiotics                                     -----------------------------                                           PCT <= 0.25 ng/mL                                                 OR                                        > 80% decrease in PCT                                      Discontinue / Do not initiate                                             antibiotics  Performed at [facility], [address] [place], [place] [address]   Renal function panel     Status: Abnormal   Collection Time: 01/13/20  3:02 PM  Result Value Ref Range   Sodium 136 135 - 145 mmol/L   Potassium 5.1 3.5 - 5.1 mmol/L   Chloride 96 (L) 98 - 111 mmol/L   CO2  20 (L) 22 - 32 mmol/L   Glucose, Bld 203 (H) 70 - 99 mg/dL    Comment: Glucose reference range applies only to samples taken after fasting for at least 8 hours.   BUN 104 (H) 6 - 20 mg/dL   Creatinine, Ser 9.23 (H) 0.44 - 1.00 mg/dL   Calcium 8.8 (L) 8.9 - 10.3 mg/dL   Phosphorus 5.6 (H) 2.5 - 4.6 mg/dL   Albumin 3.2 (L) 3.5 - 5.0 g/dL   GFR calc non Af Amer 5 (L) >60 mL/min   GFR calc Af Amer 6 (L) >60 mL/min   Anion gap 20 (H) 5 - 15    Comment: Performed at [facility], [address], [place], [place] [address]  CBC     Status: Abnormal   Collection Time: 01/13/20  3:02 PM  Result Value Ref Range   WBC 16.1 (H) 4.0 - 10.5 K/uL   RBC 2.65 (L) 3.87 - 5.11 MIL/uL   Hemoglobin 7.8 (L) 12.0 - 15.0 g/dL   HCT 25.0 (L) 36 - 46 %   MCV 94.3 80.0 - 100.0 fL   MCH 29.4 26.0 - 34.0 pg   MCHC 31.2 30.0 - 36.0 g/dL   RDW 15.7 (H) 11.5 - 15.5 %   Platelets 217 150 - 400 K/uL   nRBC 0.0 0.0 - 0.2 %    Comment: Performed at  [facility], [address], [place], [place] [address]  CBG monitoring, ED     Status: Abnormal   Collection Time: 01/13/20 11:33 PM  Result Value Ref Range   Glucose-Capillary 171 (H) 70 - 99 mg/dL    Comment: Glucose reference range applies only to samples taken after fasting for at least 8 hours.  CBG monitoring, ED     Status: Abnormal   Collection Time: 01/14/20  8:02 AM  Result Value Ref Range   Glucose-Capillary 157 (H) 70 - 99 mg/dL    Comment: Glucose reference range applies only to samples taken after fasting for at least 8 hours.  Comprehensive metabolic panel     Status: Abnormal   Collection Time: 01/14/20 10:16 AM  Result Value Ref Range   Sodium 134 (L) 135 - 145 mmol/L   Potassium 4.9 3.5 - 5.1 mmol/L   Chloride 95 (L) 98 - 111 mmol/L   CO2 21 (L) 22 - 32 mmol/L   Glucose, Bld 123 (H) 70 - 99 mg/dL    Comment: Glucose reference range applies only to samples taken after fasting for at least 8 hours.   BUN 114 (H) 6 - 20 mg/dL    Comment: RESULTS CONFIRMED BY MANUAL DILUTION   Creatinine, Ser 10.09 (H) 0.44 - 1.00 mg/dL   Calcium 8.8 (L) 8.9 - 10.3 mg/dL   Total Protein 6.9 6.5 - 8.1 g/dL   Albumin 3.0 (L) 3.5 - 5.0 g/dL   AST 13 (L) 15 - 41 U/L   ALT 38 0 - 44 U/L   Alkaline Phosphatase 97 38 - 126 U/L   Total Bilirubin 0.6 0.3 - 1.2 mg/dL   GFR calc non Af Amer 4 (L) >60 mL/min   GFR calc Af Amer 5 (L) >60 mL/min   Anion gap 18 (H) 5 - 15    Comment: Performed at [facility], [address] [place], [place] [address]  CBC     Status: Abnormal   Collection Time: 01/14/20 10:16 AM  Result Value Ref Range   WBC 12.0 (H) 4.0 - 10.5 K/uL  RBC 2.62 (L) 3.87 - 5.11 MIL/uL   Hemoglobin 7.8 (L) 12.0 - 15.0 g/dL   HCT 24.7 (L) 36 - 46 %   MCV 94.3 80.0 - 100.0 fL   MCH 29.8 26.0 - 34.0 pg   MCHC 31.6 30.0 - 36.0 g/dL   RDW 15.9 (H) 11.5 - 15.5 %   Platelets 225 150 - 400 K/uL   nRBC 0.0 0.0 - 0.2 %    Comment: Performed at [facility], [address],  [place], [place] [address]  Protime-INR     Status: Abnormal   Collection Time: 01/14/20 10:16 AM  Result Value Ref Range   Prothrombin Time 15.9 (H) 11.4 - 15.2 seconds   INR 1.3 (H) 0.8 - 1.2    Comment: (NOTE) INR goal varies based on device and disease states. Performed at [facility], [address] [place], [place] [address]   APTT     Status: None   Collection Time: 01/14/20 10:16 AM  Result Value Ref Range   aPTT 32 24 - 36 seconds    Comment: Performed at [facility], [address] [place], [place] [address]  CBG monitoring, ED     Status: Abnormal   Collection Time: 01/14/20 12:48 PM  Result Value Ref Range   Glucose-Capillary 113 (H) 70 - 99 mg/dL    Comment: Glucose reference range applies only to samples taken after fasting for at least 8 hours.   CT ANGIO CHEST PE W OR WO CONTRAST  Result Date: 01/14/2020 CLINICAL DATA:  Chest pain or shortness of breath.  Fever. EXAM: CT ANGIOGRAPHY CHEST WITH CONTRAST TECHNIQUE: Multidetector CT imaging of the chest was performed using the standard protocol during bolus administration of intravenous contrast. Multiplanar CT image reconstructions and MIPs were obtained to evaluate the vascular anatomy. CONTRAST:  178mL OMNIPAQUE IOHEXOL 350 MG/ML SOLN COMPARISON:  None similar FINDINGS: Cardiovascular: Cardiomegaly without pericardial effusion. LAD stenting. Aberrant right subclavian artery. No pulmonary artery filling defects, limited at the bases due to motion. Mediastinum/Nodes: Mild hilar nodal enlargement, likely reactive. Lungs/Pleura: Streaky opacity in the left lower lobe with volume loss. Patchy ground-glass opacity in the right lower lobe. Trace pleural fluid on the right. No pneumothorax. Upper Abdomen: No acute finding Musculoskeletal: No acute or aggressive finding Review of the MIP images confirms the above findings. IMPRESSION: 1. Airspace disease in the right lower lobe, likely pneumonia in the setting of fever. Atelectasis in the  left lower lobe. 2. No evidence of pulmonary embolism. 3. Cardiomegaly. Electronically Signed   By: Monte Fantasia M.D.   On: 01/14/2020 05:29   US Venous Img Lower Bilateral (DVT)  Result Date: 01/13/2020 CLINICAL DATA:  Bilateral lower extremity edema EXAM: BILATERAL LOWER EXTREMITY VENOUS DUPLEX ULTRASOUND TECHNIQUE: Gray-scale sonography with graded compression, as well as color Doppler and duplex ultrasound were performed to evaluate the lower extremity deep venous systems from the level of the common femoral vein and including the common femoral, femoral, profunda femoral, popliteal and calf veins including the posterior tibial, peroneal and gastrocnemius veins when visible. The superficial great saphenous vein was also interrogated. Spectral Doppler was utilized to evaluate flow at rest and with distal augmentation maneuvers in the common femoral, femoral and popliteal veins. COMPARISON:  None. FINDINGS: RIGHT LOWER EXTREMITY Common Femoral Vein: No evidence of thrombus. Normal compressibility, respiratory phasicity and response to augmentation. Saphenofemoral Junction: No evidence of thrombus. Normal compressibility and flow on color Doppler imaging. Profunda Femoral Vein: No evidence of thrombus. Normal compressibility  and flow on color Doppler imaging. Femoral Vein: No evidence of thrombus. Normal compressibility, respiratory phasicity and response to augmentation. Popliteal Vein: No evidence of thrombus. Normal compressibility, respiratory phasicity and response to augmentation. Calf Veins: No evidence of thrombus. Normal compressibility and flow on color Doppler imaging. Superficial Great Saphenous Vein: No evidence of thrombus. Normal compressibility. Venous Reflux:  None. Other Findings:  None. LEFT LOWER EXTREMITY Common Femoral Vein: No evidence of thrombus. Normal compressibility, respiratory phasicity and response to augmentation. Saphenofemoral Junction: No evidence of thrombus. Normal  compressibility and flow on color Doppler imaging. Profunda Femoral Vein: No evidence of thrombus. Normal compressibility and flow on color Doppler imaging. Femoral Vein: No evidence of thrombus. Normal compressibility, respiratory phasicity and response to augmentation. Popliteal Vein: No evidence of thrombus. Normal compressibility, respiratory phasicity and response to augmentation. Calf Veins: No evidence of thrombus. Normal compressibility and flow on color Doppler imaging. Superficial Great Saphenous Vein: No evidence of thrombus. Normal compressibility. Venous Reflux:  None. Other Findings:  None. IMPRESSION: No evidence of deep venous thrombosis in either lower extremity. Electronically Signed   By: Lowella Grip III M.D.   On: 01/13/2020 10:24   DG Chest Port 1 View  Result Date: 01/13/2020 CLINICAL DATA:  Fever and diaphoresis. EXAM: PORTABLE CHEST 1 VIEW COMPARISON:  05/29/2019 FINDINGS: Cardiac enlargement with prominence of the pulmonary outflow tract. Linear scarring or atelectasis in the right mid lung, similar to previous study. No focal consolidation. No pleural effusions. No pneumothorax. IMPRESSION: Cardiac enlargement. No evidence of active pulmonary disease. Scarring persists in the right middle lung without change. Electronically Signed   By: Lucienne Capers M.D.   On: 01/13/2020 02:44    Pending Labs Unresulted Labs (From admission, onward)          Start     Ordered   01/13/20 0751  MRSA PCR Screening  Once,   STAT        01/13/20 0750          Vitals/Pain Today's Vitals   01/14/20 1251 01/14/20 1300 01/14/20 1400 01/14/20 1415  BP: 126/86 121/78 117/78   Pulse: 75 75 76 76  Resp: (!) 25 (!) 25 (!) 25 (!) 24  Temp:      TempSrc:      SpO2: 96% 95% 98% 97%  Weight:      Height:      PainSc:        Isolation Precautions No active isolations  Medications Medications  heparin injection 5,000 Units (5,000 Units Subcutaneous Refused 01/14/20 0629)   acetaminophen (TYLENOL) suppository 650 mg (650 mg Rectal Given 01/13/20 1245)  atenolol (TENORMIN) tablet 12.5 mg (0 mg Oral Hold 01/14/20 1326)  cefTRIAXone (ROCEPHIN) 2 g in sodium chloride 0.9 % 100 mL IVPB (2 g Intravenous New Bag/Given 01/14/20 1220)  Chlorhexidine Gluconate Cloth 2 % PADS 6 each (has no administration in time range)  pentafluoroprop-tetrafluoroeth (GEBAUERS) aerosol 1 application (has no administration in time range)  lidocaine (PF) (XYLOCAINE) 1 % injection 5 mL (has no administration in time range)  lidocaine-prilocaine (EMLA) cream 1 application (has no administration in time range)  0.9 %  sodium chloride infusion (has no administration in time range)  0.9 %  sodium chloride infusion (has no administration in time range)  heparin injection 1,000 Units (has no administration in time range)  alteplase (CATHFLO ACTIVASE) injection 2 mg (has no administration in time range)  insulin aspart (novoLOG) injection 0-6 Units (0 Units Subcutaneous Not Given 01/14/20 1301)  insulin aspart (novoLOG) injection 0-5 Units (0 Units Subcutaneous Not Given 01/13/20 2336)  aspirin EC tablet 81 mg (81 mg Oral Given 01/14/20 1158)  acetaminophen (TYLENOL) tablet 650 mg (has no administration in time range)  oxyCODONE-acetaminophen (PERCOCET/ROXICET) 5-325 MG per tablet 1 tablet (1 tablet Oral Given 01/13/20 2124)  amLODipine (NORVASC) tablet 10 mg (10 mg Oral Given 01/14/20 1159)  atorvastatin (LIPITOR) tablet 80 mg (80 mg Oral Given 01/13/20 2124)  insulin glargine (LANTUS) injection 10 Units (10 Units Subcutaneous Not Given 01/14/20 1117)  insulin aspart (novoLOG) injection 5 Units (5 Units Subcutaneous Given 01/14/20 0830)  calcium carbonate (TUMS - dosed in mg elemental calcium) chewable tablet 1,000 mg (1,000 mg Oral Given 01/14/20 1210)  pantoprazole (PROTONIX) EC tablet 40 mg (40 mg Oral Given 01/14/20 0829)  polyethylene glycol (MIRALAX / GLYCOLAX) packet 17 g (17 g Oral Refused 01/14/20  1230)  senna-docusate (Senokot-S) tablet 2 tablet (2 tablets Oral Not Given 01/14/20 1327)  melatonin tablet 3 mg (3 mg Oral Given 01/13/20 2123)  rOPINIRole (REQUIP) tablet 0.5 mg (0.5 mg Oral Given 01/14/20 1206)  rOPINIRole (REQUIP) tablet 2 mg (2 mg Oral Given 01/14/20 1205)  tiZANidine (ZANAFLEX) tablet 4 mg (4 mg Oral Given 01/14/20 1202)  feeding supplement (PRO-STAT SUGAR FREE 64) liquid 30 mL (30 mLs Oral Not Given 01/14/20 1231)  ascorbic acid (VITAMIN C) tablet 500 mg (500 mg Oral Given 01/14/20 1157)  multivitamin (RENA-VIT) tablet 1 tablet (1 tablet Oral Not Given 01/13/20 2151)  cholecalciferol (VITAMIN D3) tablet 2,000 Units (2,000 Units Oral Given 01/14/20 1203)  multivitamin with minerals tablet 1 tablet (1 tablet Oral Not Given 01/14/20 1117)  nutrition supplement (JUVEN) (JUVEN) powder packet 1 packet (1 packet Oral Not Given 01/14/20 1327)  thiamine tablet 100 mg (100 mg Oral Given 01/14/20 1203)  Ipratropium-Albuterol (COMBIVENT) respimat 1 puff (has no administration in time range)  ipratropium-albuterol (DUONEB) 0.5-2.5 (3) MG/3ML nebulizer solution 3 mL (has no administration in time range)  lidocaine (LIDODERM) 5 % 1 patch (1 patch Transdermal Patch Applied 01/14/20 1212)  lidocaine-prilocaine (EMLA) cream 1 application (has no administration in time range)  vancomycin (VANCOCIN) IVPB 1000 mg/200 mL premix (0 mg Intravenous Stopped 01/13/20 0350)  piperacillin-tazobactam (ZOSYN) IVPB 3.375 g (0 g Intravenous Stopped 01/13/20 0324)  acetaminophen (TYLENOL) tablet 1,000 mg (1,000 mg Oral Given 01/13/20 0237)  iohexol (OMNIPAQUE) 350 MG/ML injection 75 mL (75 mLs Intravenous Contrast Given 01/13/20 1508)  iohexol (OMNIPAQUE) 350 MG/ML injection 100 mL (100 mLs Intravenous Contrast Given 01/14/20 0453)    Mobility manual wheelchair High fall risk   Focused Assessments    R Recommendations: See Admitting Provider Note  Report given to:   Additional Notes:

## 2020-01-14 NOTE — Progress Notes (Signed)
Patient Demographics:    [name] Barnett, is a 39 y.o. female, DOB - 12-Nov-1980, IRW:[id number]  Admit date - 01/13/2020   Admitting Physician Bernadette Hoit, DO  Outpatient Primary MD for the patient is Towner, [place], DO  LOS - 1   Chief Complaint  Patient presents with   Fever        Subjective:    [name] Barnett today has no emesis, no chest pain  T max 102.9, fever curve improving Continues to complain of shortness of breath.  She is complaining of ankle pain.  Assessment  & Plan :    Principal Problem:   Sepsis secondary to UTI [facility]) Active Problems:   Acute respiratory failure with hypoxia (HCC)   Right leg swelling   Sacral decubitus ulcer   Anemia due to end stage renal disease ([place])   Essential hypertension   Hyperlipidemia   Type 2 diabetes mellitus ([place])  Brief Summary:- 39 y.o. female with medical history significant for chronic systolic congestive heart failure (LVEF done on 03/09/2019 showed EF of 45 to 50% with mild decrease function of LV and global moderately reduced systolic function of RV), type 2 diabetes mellitus, end-stage renal disease on HD, GERD, hypertension admitted [facility] on 01/13/2020 with concerns for sepsis from presumed urinary source.   A/p 1)Sepsis secondary to Presumed urinary source  --Patient met sepsis criteria on admission please see for H&P from Dr. Josephine Cables  -Hypoxia Noted -PCT 14.64, lactic acid is not elevated -Patient initially received Vanco and Zosyn, okay to de-escalate to Rocephin pending further culture data -- WBC 16.2>>12 -Urine cultures showing multiple species.  2)Acute respiratory failure with hypoxia---etiology yet to be established - D-dimer 3.56 -Lower extremity venous Doppler without DVT --Chest x-ray without pneumonia CTA chest pending -Check CTA chest to rule out PE - D/w Nephrologist Dr.  Rolanda Lundborg to do CTA chest and do HD today 01/14/20) -Continue supplemental oxygen and Bronchodilators  3)ESRD---TTS---  missed HD on 01/12/2020 Discussed with Dr Hollie Salk (Nephrologist) --Plan is for HD today 01/14/2020  4)Sacral decubitus ulcer --- Please see photos in epic, per patient wounds are- much improved compared to previous photos  -Wound care consult requested  5)Essential hypertension Continue atenolol  6)Hyperlipidemia/DM2--- Discussed with pharmacist med rec NOT available from [facility] Use Novolog/Humalog Sliding scale insulin with Accu-Cheks/Fingersticks as ordered   7)Anemia due to ESRD --Hgb 8.1 --- which is not far from patient's usual baseline, Procrit/ESA agent as per nephrology team  8)Non-displaced right ankle distal fibula fracture -----orthopedic follow-up post discharge  DVT prophylaxis: Heparin subcu  Code Status: Full code  Family Communication:  No family at bedside. Unable to reach Harrold Donath at [phone]  Disposition Plan:  Patient is from:[place] Anticipated DC to:SNF  Anticipated DC date:2-3 days Anticipated DC barriers:Patient unstable at this time due to being septic secondary to UTI and requiring IV antibiotics at this time, patient also requiring supplemental oxygen due to hypoxia  DVT Prophylaxis  :    - Heparin -    Lab Results  Component Value Date   PLT 217 01/13/2020    Inpatient Medications  Scheduled Meds:  amLODipine  10 mg Oral Daily   ascorbic acid  500 mg Oral BID  aspirin EC  81 mg Oral Daily   atenolol  12.5 mg Oral Daily   atorvastatin  80 mg Oral QHS   calcium carbonate  1,000 mg Oral TID   Chlorhexidine Gluconate Cloth  6 each Topical Q0600   cholecalciferol  2,000 Units Oral Daily   feeding supplement (PRO-STAT SUGAR FREE 64)  30 mL Oral BID   heparin  5,000 Units Subcutaneous Q8H   insulin aspart  0-5 Units  Subcutaneous QHS   insulin aspart  0-6 Units Subcutaneous TID WC   insulin aspart  5 Units Subcutaneous TID AC   insulin glargine  10 Units Subcutaneous q AM   lidocaine  1 patch Transdermal Daily   melatonin  3 mg Oral QHS   multivitamin  1 tablet Oral QHS   multivitamin with minerals  1 tablet Oral q AM   nutrition supplement (JUVEN)  1 packet Oral BID   pantoprazole  40 mg Oral q AM   polyethylene glycol  17 g Oral Daily   rOPINIRole  0.5 mg Oral BID   rOPINIRole  2 mg Oral BID   senna-docusate  2 tablet Oral Daily   thiamine  100 mg Oral Daily   tiZANidine  4 mg Oral BID   Continuous Infusions:  sodium chloride     sodium chloride     cefTRIAXone (ROCEPHIN)  IV Stopped (01/13/20 0930)   PRN Meds:.sodium chloride, sodium chloride, acetaminophen, acetaminophen, alteplase, heparin, Ipratropium-Albuterol, ipratropium-albuterol, lidocaine (PF), lidocaine-prilocaine, lidocaine-prilocaine, oxyCODONE-acetaminophen, pentafluoroprop-tetrafluoroeth    Anti-infectives (From admission, onward)   Start     Dose/Rate Route Frequency Ordered Stop   01/13/20 1000  cefTRIAXone (ROCEPHIN) 2 g in sodium chloride 0.9 % 100 mL IVPB        2 g 200 mL/hr over 30 Minutes Intravenous Daily 01/13/20 0805     01/13/20 0200  vancomycin (VANCOCIN) IVPB 1000 mg/200 mL premix        1,000 mg 200 mL/hr over 60 Minutes Intravenous  Once 01/13/20 0151 01/13/20 0350   01/13/20 0200  piperacillin-tazobactam (ZOSYN) IVPB 3.375 g        3.375 g 100 mL/hr over 30 Minutes Intravenous  Once 01/13/20 0151 01/13/20 0324        Objective:   Vitals:   01/14/20 0500 01/14/20 0700 01/14/20 0800 01/14/20 0824  BP: (!) 145/90 (!) 157/101 (!) 158/89   Pulse:      Resp:      Temp:    98.2 F (36.8 C)  TempSrc:    Oral  SpO2:    94%  Weight:      Height:        Wt Readings from Last 3 Encounters:  01/13/20 60 kg  05/29/19 59 kg    Intake/Output Summary (Last 24 hours) at 01/14/2020  0857 Last data filed at 01/13/2020 0930 Gross per 24 hour  Intake 100 ml  Output --  Net 100 ml   Physical Exam  Gen:-Ill-appearing female, awake,  In no apparent distress  HEENT:- [place].AT, No sclera icterus Nose- [place] 2L/min Neck-Supple Neck,No JVD,.  Lungs-diminished breath sounds bilaterally, no wheezing  CV- S1, S2 normal, regular, tachycardic Abd-  +ve B.Sounds, Abd Soft, No tenderness, left lower quadrant ostomy bag with fecal material    Extremity:-Right lower extremity swelling, pedal pulses present  Psych-affect is appropriate, oriented x3 Neuro-generalized weakness, old, residual deficits from prior stroke  -no new focal deficits, no tremors MSK-right upper extremity AV graft Skin--- sacral decubitus --please see photos in  EPic   Data Review:   Micro Results Recent Results (from the past 240 hour(s))  SARS Coronavirus 2 by RT PCR (hospital order, performed in [facility] hospital lab) Nasopharyngeal Nasopharyngeal Swab     Status: None   Collection Time: 01/13/20  1:50 AM   Specimen: Nasopharyngeal Swab  Result Value Ref Range Status   SARS Coronavirus 2 NEGATIVE NEGATIVE Final    Comment: (NOTE) SARS-CoV-2 target nucleic acids are NOT DETECTED.  The SARS-CoV-2 RNA is generally detectable in upper and lower respiratory specimens during the acute phase of infection. The lowest concentration of SARS-CoV-2 viral copies this assay can detect is 250 copies / mL. A negative result does not preclude SARS-CoV-2 infection and should not be used as the sole basis for treatment or other patient management decisions.  A negative result may occur with improper specimen collection / handling, submission of specimen other than nasopharyngeal swab, presence of viral mutation(s) within the areas targeted by this assay, and inadequate number of viral copies (<250 copies / mL). A negative result must be combined with clinical observations, patient history, and epidemiological  information.  Fact Sheet for Patients:   StrictlyIdeas.no  Fact Sheet for Healthcare Providers: BankingDealers.co.za  This test is not yet approved or  cleared by the [place] FDA and has been authorized for detection and/or diagnosis of SARS-CoV-2 by FDA under an Emergency Use Authorization (EUA).  This EUA will remain in effect (meaning this test can be used) for the duration of the COVID-19 declaration under Section 564(b)(1) of the Act, 21 U.S.C. section 360bbb-3(b)(1), unless the authorization is terminated or revoked sooner.  Performed at [facility], [address], [place], [place] [address]   Culture, blood (Routine x 2)     Status: None (Preliminary result)   Collection Time: 01/13/20  2:19 AM   Specimen: Blood  Result Value Ref Range Status   Specimen Description NECK  Final   Special Requests   Final    BOTTLES DRAWN AEROBIC ONLY Blood Culture adequate volume Performed at [facility], [address], [place], [place] [address]    Culture PENDING  Incomplete   Report Status PENDING  Incomplete  Culture, blood (Routine x 2)     Status: None (Preliminary result)   Collection Time: 01/13/20  2:23 AM   Specimen: Blood  Result Value Ref Range Status   Specimen Description NECK  Final   Special Requests   Final    BOTTLES DRAWN AEROBIC ONLY Blood Culture adequate volume Performed at [facility], [address], [place], [place] [address]    Culture PENDING  Incomplete   Report Status PENDING  Incomplete  Urine culture     Status: Abnormal   Collection Time: 01/13/20  2:54 AM   Specimen: In/Out Cath Urine  Result Value Ref Range Status   Specimen Description   Final    IN/OUT CATH URINE Performed at [facility], [address] [place], [place] [address]    Special Requests   Final    NONE Performed at [facility], [address], [place], [place] [address]    Culture MULTIPLE SPECIES PRESENT, SUGGEST  RECOLLECTION (A)  Final   Report Status 01/14/2020 FINAL  Final    Radiology Reports CT ANGIO CHEST PE W OR WO CONTRAST  Result Date: 01/14/2020 CLINICAL DATA:  Chest pain or shortness of breath.  Fever. EXAM: CT ANGIOGRAPHY CHEST WITH CONTRAST TECHNIQUE: Multidetector CT imaging of the chest was performed using the standard protocol  during bolus administration of intravenous contrast. Multiplanar CT image reconstructions and MIPs were obtained to evaluate the vascular anatomy. CONTRAST:  175m OMNIPAQUE IOHEXOL 350 MG/ML SOLN COMPARISON:  None similar FINDINGS: Cardiovascular: Cardiomegaly without pericardial effusion. LAD stenting. Aberrant right subclavian artery. No pulmonary artery filling defects, limited at the bases due to motion. Mediastinum/Nodes: Mild hilar nodal enlargement, likely reactive. Lungs/Pleura: Streaky opacity in the left lower lobe with volume loss. Patchy ground-glass opacity in the right lower lobe. Trace pleural fluid on the right. No pneumothorax. Upper Abdomen: No acute finding Musculoskeletal: No acute or aggressive finding Review of the MIP images confirms the above findings. IMPRESSION: 1. Airspace disease in the right lower lobe, likely pneumonia in the setting of fever. Atelectasis in the left lower lobe. 2. No evidence of pulmonary embolism. 3. Cardiomegaly. Electronically Signed   By: JMonte FantasiaM.D.   On: 01/14/2020 05:29   UKoreaVenous Img Lower Bilateral (DVT)  Result Date: 01/13/2020 CLINICAL DATA:  Bilateral lower extremity edema EXAM: BILATERAL LOWER EXTREMITY VENOUS DUPLEX ULTRASOUND TECHNIQUE: Gray-scale sonography with graded compression, as well as color Doppler and duplex ultrasound were performed to evaluate the lower extremity deep venous systems from the level of the common femoral vein and including the common femoral, femoral, profunda femoral, popliteal and calf veins including the posterior tibial, peroneal and gastrocnemius veins when visible. The  superficial great saphenous vein was also interrogated. Spectral Doppler was utilized to evaluate flow at rest and with distal augmentation maneuvers in the common femoral, femoral and popliteal veins. COMPARISON:  None. FINDINGS: RIGHT LOWER EXTREMITY Common Femoral Vein: No evidence of thrombus. Normal compressibility, respiratory phasicity and response to augmentation. Saphenofemoral Junction: No evidence of thrombus. Normal compressibility and flow on color Doppler imaging. Profunda Femoral Vein: No evidence of thrombus. Normal compressibility and flow on color Doppler imaging. Femoral Vein: No evidence of thrombus. Normal compressibility, respiratory phasicity and response to augmentation. Popliteal Vein: No evidence of thrombus. Normal compressibility, respiratory phasicity and response to augmentation. Calf Veins: No evidence of thrombus. Normal compressibility and flow on color Doppler imaging. Superficial Great Saphenous Vein: No evidence of thrombus. Normal compressibility. Venous Reflux:  None. Other Findings:  None. LEFT LOWER EXTREMITY Common Femoral Vein: No evidence of thrombus. Normal compressibility, respiratory phasicity and response to augmentation. Saphenofemoral Junction: No evidence of thrombus. Normal compressibility and flow on color Doppler imaging. Profunda Femoral Vein: No evidence of thrombus. Normal compressibility and flow on color Doppler imaging. Femoral Vein: No evidence of thrombus. Normal compressibility, respiratory phasicity and response to augmentation. Popliteal Vein: No evidence of thrombus. Normal compressibility, respiratory phasicity and response to augmentation. Calf Veins: No evidence of thrombus. Normal compressibility and flow on color Doppler imaging. Superficial Great Saphenous Vein: No evidence of thrombus. Normal compressibility. Venous Reflux:  None. Other Findings:  None. IMPRESSION: No evidence of deep venous thrombosis in either lower extremity. Electronically  Signed   By: WLowella GripIII M.D.   On: 01/13/2020 10:24   DG Chest Port 1 View  Result Date: 01/13/2020 CLINICAL DATA:  Fever and diaphoresis. EXAM: PORTABLE CHEST 1 VIEW COMPARISON:  05/29/2019 FINDINGS: Cardiac enlargement with prominence of the pulmonary outflow tract. Linear scarring or atelectasis in the right mid lung, similar to previous study. No focal consolidation. No pleural effusions. No pneumothorax. IMPRESSION: Cardiac enlargement. No evidence of active pulmonary disease. Scarring persists in the right middle lung without change. Electronically Signed   By: WLucienne CapersM.D.   On: 01/13/2020 02:44  CBC Recent Labs  Lab 01/13/20 0219 01/13/20 1502  WBC 16.2* 16.1*  HGB 8.1* 7.8*  HCT 25.1* 25.0*  PLT 221 217  MCV 94.0 94.3  MCH 30.3 29.4  MCHC 32.3 31.2  RDW 15.8* 15.7*  LYMPHSABS 0.8  --   MONOABS 0.5  --   EOSABS 0.0  --   BASOSABS 0.1  --     Chemistries  Recent Labs  Lab 01/13/20 0219 01/13/20 1502  NA 137 136  K 4.8 5.1  CL 98 96*  CO2 20* 20*  GLUCOSE 95 203*  BUN 91* 104*  CREATININE 8.32* 9.23*  CALCIUM 9.1 8.8*  AST 13*  --   ALT 20  --   ALKPHOS 92  --   BILITOT 1.2  --    ------------------------------------------------------------------------------------------------------------------ No results for input(s): CHOL, HDL, LDLCALC, TRIG, CHOLHDL, LDLDIRECT in the last 72 hours.  Lab Results  Component Value Date   HGBA1C 7.1 (H) 01/13/2020   ------------------------------------------------------------------------------------------------------------------ No results for input(s): TSH, T4TOTAL, T3FREE, THYROIDAB in the last 72 hours.  Invalid input(s): FREET3 ------------------------------------------------------------------------------------------------------------------ No results for input(s): VITAMINB12, FOLATE, FERRITIN, TIBC, IRON, RETICCTPCT in the last 72 hours.  Coagulation profile Recent Labs  Lab 01/13/20 0219   INR 1.4*    Recent Labs    01/13/20 0219  DDIMER 3.56*    Cardiac Enzymes No results for input(s): CKMB, TROPONINI, MYOGLOBIN in the last 168 hours.  Invalid input(s): CK ------------------------------------------------------------------------------------------------------------------ No results found for: BNP   Yaakov Guthrie M.D on 01/14/2020 at 8:57 AM  Go to [email or web address] - for contact info  [facility] - Office  [phone]

## 2020-01-14 NOTE — ED Notes (Signed)
Pt. Has not eaten lunch.

## 2020-01-14 NOTE — ED Notes (Signed)
Pt has working PIV by ultrasound. CT notified.

## 2020-01-14 NOTE — Procedures (Addendum)
     HEMODIALYSIS TREATMENT NOTE:   3 hours and 24 minutes of heparin-free HD completed via RUE AVF (15g/antegrade). Goal met; 3 liters removed without interruption in UF.  Reported feeling "some pressure in my chest" with 6 minutes RTD and treatment was discontinued at that time at pt's request.  VSS and no EKG changes.  Primary RN was notified.  All blood was returned and hemostasis was achieved in 15 minutes.  Reported resolution in chest pressure.  No changes from pre-HD assessment.  Rockwell Alexandria, RN

## 2020-01-14 NOTE — ED Notes (Signed)
Attempted report x1. 

## 2020-01-14 NOTE — Consult Note (Signed)
[place] Date: 01/13/2020 01/14/2020 Rexene Agent Requesting Physician:  Denton Brick MD  Reason for Consult:  ESRD comanagement HPI:  28F ESRD THS at [place] in [place], currently at [facility] presented over the weekend with a chief complaint of dysuria and found to have hypoxia, fever.  UA suggestive of UTI, placed on broad-spectrum antibiotics.  Also with concern for pneumonia.  Given hypoxia underwent lower extremity Doppler study negative for DVT and then earlier today CTA of the chest which was negative for acute PE.  Findings were consistent with a right lower lobe pneumonia.  Patient originally placed on vancomycin and Zosyn, currently receiving ceftriaxone.  She missed HD on 9/11  She feels improved but does have some tenderness on her backside.  Most recent labs, from yesterday with potassium 5.1, bicarbonate 20, BUN 104.  1.  Hemoglobin is currently 7.8 with a WBC of 16.1.  ROS Balance of 12 systems is negative w/ exceptions as above  PMH  Past Medical History:  Diagnosis Date  . Diabetes mellitus without complication ([place])   . Hyperlipidemia   . Hypertension   . Renal disorder   . Stroke ([place])   . Vitamin D deficiency    PSH  Past Surgical History:  Procedure Laterality Date  . cardiac stents    . COLOSTOMY    . wound on buttocks     FH History reviewed. No pertinent family history. SH  reports that she has never smoked. She has never used smokeless tobacco. She reports that she does not drink alcohol and does not use drugs. Allergies  Allergies  Allergen Reactions  . Corn-Containing Products   . Lisinopril   . Morphine And Related    Home medications Prior to Admission medications   Medication Sig Start Date End Date Taking? Authorizing Provider  acetaminophen (TYLENOL) 325 MG tablet Take 650 mg by mouth every 6 (six) hours as needed.   Yes [provider]  Amino Acids-Protein Hydrolys (FEEDING SUPPLEMENT, PRO-STAT SUGAR FREE 64,)  LIQD Take 30 mLs by mouth in the morning and at bedtime.   Yes [provider]  amLODipine (NORVASC) 10 MG tablet Take 1 tablet by mouth daily. Give on Tues, thurs, sat, sun 03/22/18  Yes [provider]  ascorbic acid (VITAMIN C) 500 MG tablet Take 500 mg by mouth 2 (two) times daily.   Yes [provider]  aspirin EC 81 MG tablet Take 81 mg by mouth daily. Swallow whole.   Yes [provider]  b complex-vitamin c-folic acid (NEPHRO-VITE) 0.8 MG TABS tablet Take 1 tablet by mouth daily.   Yes [provider]  calcium elemental as carbonate (BARIATRIC TUMS ULTRA) 400 MG chewable tablet Chew 1,000 mg by mouth 3 (three) times daily. Before meals   Yes [provider]  Cholecalciferol (VITAMIN D) 50 MCG (2000 UT) CAPS Take 1 capsule by mouth daily.   Yes [provider]  dicyclomine (BENTYL) 10 MG capsule Take 10 mg by mouth in the morning, at noon, and at bedtime.   Yes [provider]  HYDROcodone-acetaminophen (NORCO/VICODIN) 5-325 MG tablet Take 1 tablet by mouth 3 (three) times daily. For pain   Yes [provider]  insulin aspart (NOVOLOG) 100 UNIT/ML injection Inject 5 Units into the skin 3 (three) times daily before meals.   Yes [provider]  insulin glargine (LANTUS) 100 UNIT/ML Solostar Pen Inject 10 Units into the skin in the morning.  08/21/18  Yes [provider]  Ipratropium-Albuterol (COMBIVENT RESPIMAT) 20-100 MCG/ACT AERS respimat Inhale 1 puff into the lungs every 4 (four) hours as needed for wheezing or shortness of breath.   Yes [provider]  ipratropium-albuterol (DUONEB) 0.5-2.5 (3) MG/3ML SOLN Take 3 mLs by nebulization every 4 (four) hours as needed.   Yes [provider]  Lidocaine (ASPERCREME LIDOCAINE) 4 % PTCH Apply 1 patch topically daily. Apply to back   Yes [provider]  lidocaine-prilocaine (EMLA) cream Apply 1 application topically daily as  needed. Apply to HD graft site right arm topically one every tues, thurs, sat for pain.   Yes [provider]  melatonin 3 MG TABS tablet Take 1 tablet by mouth at bedtime. 10/31/18  Yes [provider]  Multiple Vitamins-Minerals (CERTAGEN PO) Take 1 tablet by mouth in the morning.   Yes [provider]  nutrition supplement, JUVEN, (JUVEN) PACK Take 1 packet by mouth 2 (two) times daily.   Yes [provider]  oxyCODONE-acetaminophen (PERCOCET/ROXICET) 5-325 MG tablet Take 1 tablet by mouth daily as needed for severe pain (For breakthrough pain).   Yes [provider]  pantoprazole (PROTONIX) 40 MG tablet Take 40 mg by mouth in the morning.   Yes [provider]  polyethylene glycol (MIRALAX / GLYCOLAX) 17 g packet Take 17 g by mouth daily.   Yes [provider]  rOPINIRole (REQUIP) 0.5 MG tablet Take 0.5 mg by mouth 2 (two) times daily.   Yes [provider]  rOPINIRole (REQUIP) 2 MG tablet Take 2 mg by mouth 2 (two) times daily.   Yes [provider]  sennosides-docusate sodium (SENOKOT-S) 8.6-50 MG tablet Take 2 tablets by mouth daily.   Yes [provider]  thiamine 100 MG tablet Take 100 mg by mouth daily.   Yes [provider]  tiZANidine (ZANAFLEX) 4 MG tablet Take 4 mg by mouth 2 (two) times daily.   Yes [provider]  atorvastatin (LIPITOR) 80 MG tablet Take 1 tablet by mouth at bedtime.    [provider]    Current Medications Scheduled Meds: . amLODipine  10 mg Oral Daily  . ascorbic acid  500 mg Oral BID  . aspirin EC  81 mg Oral Daily  . atenolol  12.5 mg Oral Daily  . atorvastatin  80 mg Oral QHS  . calcium carbonate  1,000 mg Oral TID  . Chlorhexidine Gluconate Cloth  6 each Topical Q0600  . cholecalciferol  2,000 Units Oral Daily  . feeding supplement (PRO-STAT SUGAR FREE 64)  30 mL Oral BID  . heparin  5,000 Units Subcutaneous Q8H  . insulin aspart  0-5  Units Subcutaneous QHS  . insulin aspart  0-6 Units Subcutaneous TID WC  . insulin aspart  5 Units Subcutaneous TID AC  . insulin glargine  10 Units Subcutaneous q AM  . lidocaine  1 patch Transdermal Daily  . melatonin  3 mg Oral QHS  . multivitamin  1 tablet Oral QHS  . multivitamin with minerals  1 tablet Oral q AM  . nutrition supplement (JUVEN)  1 packet Oral BID  . pantoprazole  40 mg Oral q AM  . polyethylene glycol  17 g Oral Daily  . rOPINIRole  0.5 mg Oral BID  . rOPINIRole  2 mg Oral BID  . senna-docusate  2 tablet Oral Daily  . thiamine  100 mg Oral Daily  . tiZANidine  4 mg Oral BID   Continuous Infusions: .  sodium chloride    . sodium chloride    . cefTRIAXone (ROCEPHIN)  IV Stopped (01/13/20 0930)   PRN Meds:.sodium chloride, sodium chloride, acetaminophen, acetaminophen, alteplase, heparin, Ipratropium-Albuterol, ipratropium-albuterol, lidocaine (PF), lidocaine-prilocaine, lidocaine-prilocaine, oxyCODONE-acetaminophen, pentafluoroprop-tetrafluoroeth  CBC Recent Labs  Lab 01/13/20 0219 01/13/20 1502  WBC 16.2* 16.1*  NEUTROABS 14.7*  --   HGB 8.1* 7.8*  HCT 25.1* 25.0*  MCV 94.0 94.3  PLT 221 800   Basic Metabolic Panel Recent Labs  Lab 01/13/20 0219 01/13/20 1502  NA 137 136  K 4.8 5.1  CL 98 96*  CO2 20* 20*  GLUCOSE 95 203*  BUN 91* 104*  CREATININE 8.32* 9.23*  CALCIUM 9.1 8.8*  PHOS  --  5.6*    Physical Exam  Blood pressure (!) 158/89, pulse 81, temperature 98.2 F (36.8 C), temperature source Oral, resp. rate 18, height 5\' 5"  (1.651 m), weight 60 kg, SpO2 94 %. GEN: Chronically ill-appearing, NAD, lying in stretcher ENT: NCAT EYES: EOMI CV: Regular, no rub PULM: Diminished in the bases, normal work of breathing ABD: Soft, nontender SKIN: No rashes or lesions EXT: No significant edema  Assessment 41F ESRD THS RUE AVF with [facility] here with UTI, pneumonia, sepsis.  1. ESRD: HD today after contrast exposure and azotemia, then  reassess tomorrow to get back on track either Tuesday or will Thursday.  2K.  3.5 hours.  2 to 3 L UF.  No heparin. 2. UTI on ceftriaxone, blood cultures pending, per primary 3. HCAP, antibiotics as above, per primary; appears to be improving; defervesced.  2 L nasal cannula. 4. HTN/[place]um[place]: BP is elevated, follow with HD today, continue amlodipine and atenolol 5. 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 As above, HD today, reassess tomorrow   Rexene Agent  [phone] pgr 01/14/2020, 9:26 AM

## 2020-01-15 ENCOUNTER — Other Ambulatory Visit: Payer: Self-pay

## 2020-01-15 LAB — BASIC METABOLIC PANEL
Anion gap: 17 — ABNORMAL HIGH (ref 5–15)
BUN: 44 mg/dL — ABNORMAL HIGH (ref 6–20)
CO2: 25 mmol/L (ref 22–32)
Calcium: 8.9 mg/dL (ref 8.9–10.3)
Chloride: 94 mmol/L — ABNORMAL LOW (ref 98–111)
Creatinine, Ser: 5.09 mg/dL — ABNORMAL HIGH (ref 0.44–1.00)
GFR calc Af Amer: 11 mL/min — ABNORMAL LOW (ref >60)
GFR calc non Af Amer: 10 mL/min — ABNORMAL LOW (ref >60)
Glucose, Bld: 92 mg/dL (ref 70–99)
Potassium: 3.7 mmol/L (ref 3.5–5.1)
Sodium: 136 mmol/L (ref 135–145)

## 2020-01-15 LAB — CBC
HCT: 25.4 % — ABNORMAL LOW (ref 36.0–46.0)
Hemoglobin: 8 g/dL — ABNORMAL LOW (ref 12.0–15.0)
MCH: 29.5 pg (ref 26.0–34.0)
MCHC: 31.5 g/dL (ref 30.0–36.0)
MCV: 93.7 fL (ref 80.0–100.0)
Platelets: 232 10*3/uL (ref 150–400)
RBC: 2.71 MIL/uL — ABNORMAL LOW (ref 3.87–5.11)
RDW: 15.7 % — ABNORMAL HIGH (ref 11.5–15.5)
WBC: 7.8 10*3/uL (ref 4.0–10.5)
nRBC: 0 % (ref 0.0–0.2)

## 2020-01-15 LAB — GLUCOSE, CAPILLARY
Glucose-Capillary: 88 mg/dL (ref 70–99)
Glucose-Capillary: 85 mg/dL (ref 70–99)
Glucose-Capillary: 86 mg/dL (ref 70–99)
Glucose-Capillary: 96 mg/dL (ref 70–99)

## 2020-01-15 LAB — MRSA PCR SCREENING: MRSA by PCR: NEGATIVE

## 2020-01-15 NOTE — Progress Notes (Signed)
Patient Demographics:    [name] Barnett, is a 39 y.o. female, DOB - [birth year], XBW:[id number]  Admit date - 01/13/2020   Admitting Physician Bernadette Hoit, DO  Outpatient Primary MD for the patient is [place], [place], DO  LOS - 2   Chief Complaint  Patient presents with  . Fever        Subjective:    [name] Barnett today has no emesis, no chest pain  fever curve improving She still having some shortness of breath but improving per the patient.  Assessment  & Plan :    Principal Problem:   Sepsis secondary to UTI [facility]) Active Problems:   Acute respiratory failure with hypoxia (HCC)   Right leg swelling   Sacral decubitus ulcer   Anemia due to end stage renal disease ([place])   Essential hypertension   Hyperlipidemia   Type 2 diabetes mellitus ([place])  Brief Summary:- 39 y.o. female with medical history significant for chronic systolic congestive heart failure (LVEF done on 03/09/2019 showed EF of 45 to 50% with mild decrease function of LV and global moderately reduced systolic function of RV), type 2 diabetes mellitus, end-stage renal disease on HD, GERD, hypertension admitted from [facility] on 01/13/2020 with concerns for sepsis from presumed urinary source versus pneumonia.   A/p 1)Sepsis secondary to Presumed urinary source  --Patient met sepsis criteria on admission please see for H&P from Dr. Josephine Cables  -Hypoxia Noted -PCT 14.64, lactic acid is not elevated -Patient initially received Vanco and Zosyn. -Descalated to ceftriaxone. -- WBC 16.2>>12>> 7.8. -Urine cultures showing multiple species.  2)Acute respiratory failure with hypoxia--- likely secondary to pneumonia. - D-dimer 3.56 -Lower extremity venous Doppler without DVT --Chest x-ray without pneumonia. -CTA chest to rule out PE did not show pulmonary embolism but showed right lower lobe pneumonia, atelectasis  in the left lower lobe. -Patient says she is still having some shortness of breath but feels better and wanting to be discharged.  However, on removing the oxygen she desaturated to 86.   -We will continue antibiotics.  Continue supplemental oxygen and Bronchodilators  3)ESRD---TTS---  missed HD on 01/12/2020 -She underwent dialysis on 01/14/2020. -Okay to be discharged from nephrology standpoint.  However, patient hypoxemic and currently O2 dependent.  Therefore we will continue antibiotics. -Dialysis as per nephrology.  4)Sacral decubitus ulcer --- Please see photos in epic, per patient wounds are- much improved compared to previous photos  -Wound care consult requested  5)Essential hypertension Continue atenolol -Continue to monitor blood pressure and adjust medications as needed.  6)Hyperlipidemia/DM2--- Novolog/Humalog Sliding scale insulin with Accu-Cheks/Fingersticks as ordered   7)Anemia due to ESRD --Hgb 8 --- which is not far from patient's usual baseline, Procrit/ESA agent as per nephrology team  8)Non-displaced right ankle distal fibula fracture -----Per patient she is supposed to follow-up outpatient orthopedic post discharge  DVT prophylaxis: Heparin subcutaneous  Code Status: Full code  Family Communication:  No family at bedside. Unable to reach Harrold Donath at [phone]  Disposition Plan:  Patient is from:[facility] Anticipated DC to:SNF  Anticipated DC date:1-2 days Anticipated DC barriers:Patient unstable at this time due to b pneumonia, requiring IV antibiotics at this time, patient also requiring supplemental oxygen due to hypoxia  DVT Prophylaxis  :    -  Heparin -    Lab Results  Component Value Date   PLT 232 01/15/2020    Inpatient Medications  Scheduled Meds: . amLODipine  10 mg Oral Daily  . ascorbic acid  500 mg Oral BID  . aspirin EC  81 mg Oral Daily  .  atenolol  12.5 mg Oral Daily  . atorvastatin  80 mg Oral QHS  . calcium carbonate  1,000 mg Oral TID  . Chlorhexidine Gluconate Cloth  6 each Topical Q0600  . cholecalciferol  2,000 Units Oral Daily  . feeding supplement (PRO-STAT SUGAR FREE 64)  30 mL Oral BID  . heparin  5,000 Units Subcutaneous Q8H  . insulin aspart  0-5 Units Subcutaneous QHS  . insulin aspart  0-6 Units Subcutaneous TID WC  . insulin aspart  5 Units Subcutaneous TID AC  . insulin glargine  10 Units Subcutaneous q AM  . lidocaine  1 patch Transdermal Daily  . melatonin  3 mg Oral QHS  . multivitamin  1 tablet Oral QHS  . multivitamin with minerals  1 tablet Oral q AM  . nutrition supplement (JUVEN)  1 packet Oral BID  . pantoprazole  40 mg Oral q AM  . polyethylene glycol  17 g Oral Daily  . rOPINIRole  0.5 mg Oral BID  . rOPINIRole  2 mg Oral BID  . senna-docusate  2 tablet Oral Daily  . thiamine  100 mg Oral Daily  . tiZANidine  4 mg Oral BID   Continuous Infusions: . sodium chloride    . sodium chloride    . cefTRIAXone (ROCEPHIN)  IV 2 g (01/14/20 1220)   PRN Meds:.sodium chloride, sodium chloride, acetaminophen, acetaminophen, alteplase, alum & mag hydroxide-simeth, heparin, Ipratropium-Albuterol, ipratropium-albuterol, lidocaine (PF), lidocaine-prilocaine, lidocaine-prilocaine, oxyCODONE-acetaminophen, pentafluoroprop-tetrafluoroeth    Anti-infectives (From admission, onward)   Start     Dose/Rate Route Frequency Ordered Stop   01/13/20 1000  cefTRIAXone (ROCEPHIN) 2 g in sodium chloride 0.9 % 100 mL IVPB        2 g 200 mL/hr over 30 Minutes Intravenous Daily 01/13/20 0805     01/13/20 0200  vancomycin (VANCOCIN) IVPB 1000 mg/200 mL premix        1,000 mg 200 mL/hr over 60 Minutes Intravenous  Once 01/13/20 0151 01/13/20 0350   01/13/20 0200  piperacillin-tazobactam (ZOSYN) IVPB 3.375 g        3.375 g 100 mL/hr over 30 Minutes Intravenous  Once 01/13/20 0151 01/13/20 0324        Objective:    Vitals:   01/14/20 2108 01/14/20 2311 01/15/20 0036 01/15/20 0456  BP: 133/82  116/78 123/71  Pulse: 87  86 85  Resp: 20  18 20   Temp: (!) 97.4 F (36.3 C)  98.3 F (36.8 C) 98.4 F (36.9 C)  TempSrc: Oral  Oral Oral  SpO2: 98%  95% 96%  Weight:  74 kg    Height:  5' 5"  (1.651 m)      Wt Readings from Last 3 Encounters:  01/14/20 74 kg  05/29/19 59 kg    Intake/Output Summary (Last 24 hours) at 01/15/2020 0841 Last data filed at 01/14/2020 2019 Gross per 24 hour  Intake --  Output 2980 ml  Net -2980 ml   Physical Exam  Gen:-Ill-appearing female, awake,  In no apparent distress  HEENT:- [place].AT, No sclera icterus Nose- [place] 2L/min Neck-Supple Neck,No JVD,.  Lungs-diminished breath sounds lower lobes, occasional rhonchi, no wheezing  CV- S1, S2 normal, regular, tachycardic Abd-  +  ve B.Sounds, Abd Soft, No tenderness, left lower quadrant ostomy bag     Extremity:-Right ankle swelling, pedal pulses present  Psych-affect is appropriate, oriented x3 Neuro-generalized weakness, old, residual deficits from prior stroke  -no new focal deficits, no tremors MSK-right upper extremity AV graft Skin--- sacral decubitus --please see photos in EPic   Data Review:   Micro Results Recent Results (from the past 240 hour(s))  SARS Coronavirus 2 by RT PCR (hospital order, performed in [facility] hospital lab) Nasopharyngeal Nasopharyngeal Swab     Status: None   Collection Time: 01/13/20  1:50 AM   Specimen: Nasopharyngeal Swab  Result Value Ref Range Status   SARS Coronavirus 2 NEGATIVE NEGATIVE Final    Comment: (NOTE) SARS-CoV-2 target nucleic acids are NOT DETECTED.  The SARS-CoV-2 RNA is generally detectable in upper and lower respiratory specimens during the acute phase of infection. The lowest concentration of SARS-CoV-2 viral copies this assay can detect is 250 copies / mL. A negative result does not preclude SARS-CoV-2 infection and should not be used as the sole basis  for treatment or other patient management decisions.  A negative result may occur with improper specimen collection / handling, submission of specimen other than nasopharyngeal swab, presence of viral mutation(s) within the areas targeted by this assay, and inadequate number of viral copies (<250 copies / mL). A negative result must be combined with clinical observations, patient history, and epidemiological information.  Fact Sheet for Patients:   StrictlyIdeas.no  Fact Sheet for Healthcare Providers: BankingDealers.co.za  This test is not yet approved or  cleared by the [place] FDA and has been authorized for detection and/or diagnosis of SARS-CoV-2 by FDA under an Emergency Use Authorization (EUA).  This EUA will remain in effect (meaning this test can be used) for the duration of the COVID-19 declaration under Section 564(b)(1) of the Act, 21 U.S.C. section 360bbb-3(b)(1), unless the authorization is terminated or revoked sooner.  Performed at [facility], [address] [place], [place] [address]   Culture, blood (Routine x 2)     Status: None (Preliminary result)   Collection Time: 01/13/20  2:19 AM   Specimen: Neck; Blood  Result Value Ref Range Status   Specimen Description NECK  Final   Special Requests   Final    BOTTLES DRAWN AEROBIC ONLY Blood Culture adequate volume   Culture   Final    NO GROWTH 1 DAY Performed at [facility], [address] [place], [place] [address]    Report Status PENDING  Incomplete  Culture, blood (Routine x 2)     Status: None (Preliminary result)   Collection Time: 01/13/20  2:23 AM   Specimen: Neck; Blood  Result Value Ref Range Status   Specimen Description NECK  Final   Special Requests   Final    BOTTLES DRAWN AEROBIC ONLY Blood Culture adequate volume   Culture   Final    NO GROWTH 1 DAY Performed at [facility], [address] [place], [place] [address]    Report Status  PENDING  Incomplete  Urine culture     Status: Abnormal   Collection Time: 01/13/20  2:54 AM   Specimen: In/Out Cath Urine  Result Value Ref Range Status   Specimen Description   Final    IN/OUT CATH URINE Performed at [facility], [address], [place], [place] [address]    Special Requests   Final    NONE Performed at [facility], [address],  [place], [place] [address]    Culture MULTIPLE SPECIES PRESENT, SUGGEST RECOLLECTION (A)  Final   Report Status 01/14/2020 FINAL  Final  MRSA PCR Screening     Status: None   Collection Time: 01/13/20  7:51 AM   Specimen: Nasal Mucosa; Nasopharyngeal  Result Value Ref Range Status   MRSA by PCR NEGATIVE NEGATIVE Final    Comment:        The GeneXpert MRSA Assay (FDA approved for NASAL specimens only), is one component of a comprehensive MRSA colonization surveillance program. It is not intended to diagnose MRSA infection nor to guide or monitor treatment for MRSA infections. Performed at [facility], [address], [place], [place] [address]     Radiology Reports CT ANGIO CHEST PE W OR WO CONTRAST  Result Date: 01/14/2020 CLINICAL DATA:  Chest pain or shortness of breath.  Fever. EXAM: CT ANGIOGRAPHY CHEST WITH CONTRAST TECHNIQUE: Multidetector CT imaging of the chest was performed using the standard protocol during bolus administration of intravenous contrast. Multiplanar CT image reconstructions and MIPs were obtained to evaluate the vascular anatomy. CONTRAST:  170m OMNIPAQUE IOHEXOL 350 MG/ML SOLN COMPARISON:  None similar FINDINGS: Cardiovascular: Cardiomegaly without pericardial effusion. LAD stenting. Aberrant right subclavian artery. No pulmonary artery filling defects, limited at the bases due to motion. Mediastinum/Nodes: Mild hilar nodal enlargement, likely reactive. Lungs/Pleura: Streaky opacity in the left lower lobe with volume loss. Patchy ground-glass opacity in the right lower lobe. Trace pleural fluid on the  right. No pneumothorax. Upper Abdomen: No acute finding Musculoskeletal: No acute or aggressive finding Review of the MIP images confirms the above findings. IMPRESSION: 1. Airspace disease in the right lower lobe, likely pneumonia in the setting of fever. Atelectasis in the left lower lobe. 2. No evidence of pulmonary embolism. 3. Cardiomegaly. Electronically Signed   By: JMonte FantasiaM.D.   On: 01/14/2020 05:29   UK[place]Venous Img Lower Bilateral (DVT)  Result Date: 01/13/2020 CLINICAL DATA:  Bilateral lower extremity edema EXAM: BILATERAL LOWER EXTREMITY VENOUS DUPLEX ULTRASOUND TECHNIQUE: Gray-scale sonography with graded compression, as well as color Doppler and duplex ultrasound were performed to evaluate the lower extremity deep venous systems from the level of the common femoral vein and including the common femoral, femoral, profunda femoral, popliteal and calf veins including the posterior tibial, peroneal and gastrocnemius veins when visible. The superficial great saphenous vein was also interrogated. Spectral Doppler was utilized to evaluate flow at rest and with distal augmentation maneuvers in the common femoral, femoral and popliteal veins. COMPARISON:  None. FINDINGS: RIGHT LOWER EXTREMITY Common Femoral Vein: No evidence of thrombus. Normal compressibility, respiratory phasicity and response to augmentation. Saphenofemoral Junction: No evidence of thrombus. Normal compressibility and flow on color Doppler imaging. Profunda Femoral Vein: No evidence of thrombus. Normal compressibility and flow on color Doppler imaging. Femoral Vein: No evidence of thrombus. Normal compressibility, respiratory phasicity and response to augmentation. Popliteal Vein: No evidence of thrombus. Normal compressibility, respiratory phasicity and response to augmentation. Calf Veins: No evidence of thrombus. Normal compressibility and flow on color Doppler imaging. Superficial Great Saphenous Vein: No evidence of thrombus.  Normal compressibility. Venous Reflux:  None. Other Findings:  None. LEFT LOWER EXTREMITY Common Femoral Vein: No evidence of thrombus. Normal compressibility, respiratory phasicity and response to augmentation. Saphenofemoral Junction: No evidence of thrombus. Normal compressibility and flow on color Doppler imaging. Profunda Femoral Vein: No evidence of thrombus. Normal compressibility and flow on color Doppler imaging. Femoral Vein: No evidence of thrombus. Normal compressibility, respiratory  phasicity and response to augmentation. Popliteal Vein: No evidence of thrombus. Normal compressibility, respiratory phasicity and response to augmentation. Calf Veins: No evidence of thrombus. Normal compressibility and flow on color Doppler imaging. Superficial Great Saphenous Vein: No evidence of thrombus. Normal compressibility. Venous Reflux:  None. Other Findings:  None. IMPRESSION: No evidence of deep venous thrombosis in either lower extremity. Electronically Signed   By: Lowella Grip III M.D.   On: 01/13/2020 10:24   DG Chest Port 1 View  Result Date: 01/13/2020 CLINICAL DATA:  Fever and diaphoresis. EXAM: PORTABLE CHEST 1 VIEW COMPARISON:  05/29/2019 FINDINGS: Cardiac enlargement with prominence of the pulmonary outflow tract. Linear scarring or atelectasis in the right mid lung, similar to previous study. No focal consolidation. No pleural effusions. No pneumothorax. IMPRESSION: Cardiac enlargement. No evidence of active pulmonary disease. Scarring persists in the right middle lung without change. Electronically Signed   By: Lucienne Capers M.D.   On: 01/13/2020 02:44     CBC Recent Labs  Lab 01/13/20 0219 01/13/20 1502 01/14/20 1016 01/15/20 0606  WBC 16.2* 16.1* 12.0* 7.8  HGB 8.1* 7.8* 7.8* 8.0*  HCT 25.1* 25.0* 24.7* 25.4*  PLT 221 217 225 232  MCV 94.0 94.3 94.3 93.7  MCH 30.3 29.4 29.8 29.5  MCHC 32.3 31.2 31.6 31.5  RDW 15.8* 15.7* 15.9* 15.7*  LYMPHSABS 0.8  --   --   --     MONOABS 0.5  --   --   --   EOSABS 0.0  --   --   --   BASOSABS 0.1  --   --   --     Chemistries  Recent Labs  Lab 01/13/20 0219 01/13/20 1502 01/14/20 1016 01/15/20 0606  NA 137 136 134* 136  K 4.8 5.1 4.9 3.7  CL 98 96* 95* 94*  CO2 20* 20* 21* 25  GLUCOSE 95 203* 123* 92  BUN 91* 104* 114* 44*  CREATININE 8.32* 9.23* 10.09* 5.09*  CALCIUM 9.1 8.8* 8.8* 8.9  AST 13*  --  13*  --   ALT 20  --  38  --   ALKPHOS 92  --  97  --   BILITOT 1.2  --  0.6  --    ------------------------------------------------------------------------------------------------------------------ No results for input(s): CHOL, HDL, LDLCALC, TRIG, CHOLHDL, LDLDIRECT in the last 72 hours.  Lab Results  Component Value Date   HGBA1C 7.1 (H) 01/13/2020   ------------------------------------------------------------------------------------------------------------------ No results for input(s): TSH, T4TOTAL, T3FREE, THYROIDAB in the last 72 hours.  Invalid input(s): FREET3 ------------------------------------------------------------------------------------------------------------------ No results for input(s): VITAMINB12, FOLATE, FERRITIN, TIBC, IRON, RETICCTPCT in the last 72 hours.  Coagulation profile Recent Labs  Lab 01/13/20 0219 01/14/20 1016  INR 1.4* 1.3*    Recent Labs    01/13/20 0219  DDIMER 3.56*    Cardiac Enzymes No results for input(s): CKMB, TROPONINI, MYOGLOBIN in the last 168 hours.  Invalid input(s): CK ------------------------------------------------------------------------------------------------------------------ No results found for: BNP   Yaakov Guthrie M.D on 01/15/2020 at 8:41 AM  Go to [email or web address] - for contact info  [facility] - Office  [phone]

## 2020-01-15 NOTE — TOC Initial Note (Signed)
Transition of Care [facility]) - Initial/Assessment Note    Patient Details  Name: [name] Barnett MRN: [id number] Date of Birth: 02-Aug-1980  Transition of Care [facility]) CM/SW Contact:    Iona Beard, [place] Phone Number: 01/15/2020, 3:40 PM  Clinical Narrative:                 Pt admitted for sepsis secondary to UTI. Pt is from [facility]. CSW completed FL2 and sent to [facility]. TOC spoke with Melissa at [facility], they can take pt back upon D/C. MD informed CSW that pts O2 stats had dropped and pt is no longer able to D/C today 9/14. Pt should be able to D/C tomorrow 9/15 to [facility]. TOC will schedule EMS when pt is ready for D/C. TOC to follow.   Expected Discharge Plan: Skilled Nursing Facility Barriers to Discharge: Continued Medical Work up   Patient Goals and CMS Choice Patient states their goals for this hospitalization and ongoing recovery are:: Return to [facility]   Choice offered to / list presented to : NA  Expected Discharge Plan and Services Expected Discharge Plan: [place] Choice: [place] Living arrangements for the past 2 months: [place]: NA [place]: NA        Prior Living Arrangements/Services Living arrangements for the past 2 months: [place]   Patient language and need for interpreter reviewed:: Yes Do you feel safe going back to the place where you live?: Yes        Care giver support system in place?: Yes (comment)   Criminal Activity/Legal Involvement Pertinent to Current Situation/Hospitalization: No - Comment as needed  Activities of Daily Living Home Assistive Devices/Equipment: None ADL Screening (condition at time of admission) Patient's cognitive ability adequate to safely complete daily activities?: Yes Is the patient deaf or have difficulty  hearing?: No Does the patient have difficulty seeing, even when wearing glasses/contacts?: No Does the patient have difficulty concentrating, remembering, or making decisions?: No Patient able to express need for assistance with ADLs?: Yes Does the patient have difficulty dressing or bathing?: No Independently performs ADLs?: Yes (appropriate for developmental age) Does the patient have difficulty walking or climbing stairs?: Yes Weakness of Legs: Both Weakness of Arms/Hands: Both  Permission Sought/Granted                  Emotional Assessment           Psych Involvement: No (comment)  Admission diagnosis:  Fever [R50.9] Upper urinary tract infection [N39.0] Sepsis ([place]) [A41.9] Severe sepsis ([place]) [A41.9, R65.20] Right leg swelling [M79.89] Patient Active Problem List   Diagnosis Date Noted  . Sepsis secondary to UTI ([place]) 01/13/2020  . Acute respiratory failure with hypoxia ([place]) 01/13/2020  . Right leg swelling 01/13/2020  . Sacral decubitus ulcer 01/13/2020  . Anemia due to end stage renal disease ([place]) 01/13/2020  . Essential hypertension 01/13/2020  . Hyperlipidemia 01/13/2020  . Type 2 diabetes mellitus ([place]) 01/13/2020   PCP:  Julianne Handler, DO Pharmacy:  No Pharmacies Listed    Social Determinants of Health (SDOH) Interventions    Readmission Risk Interventions No flowsheet data found.

## 2020-01-15 NOTE — Progress Notes (Signed)
Pt off o2 for 30 minutes sat 86-87%. Replaced o2 and sat 96-97% Will update MD

## 2020-01-15 NOTE — Progress Notes (Signed)
Admit: 01/13/2020 LOS: 2  [name] Barnett ESRD THS RUE AVF with [facility] here with UTI, pneumonia, sepsis.  Subjective:  . HD yesterday, 2.98 L UF, mild chest pain at the end of treatment, resolved . Feels improved, wants to go home . On 2 L nasal cannula, normal SPO2  09/13 0701 - 09/14 0700 In: -  Out: 2980   Filed Weights   01/13/20 0111 01/14/20 1645 01/14/20 2311  Weight: 60 kg 75.5 kg 74 kg    Scheduled Meds: . amLODipine  10 mg Oral Daily  . ascorbic acid  500 mg Oral BID  . aspirin EC  81 mg Oral Daily  . atenolol  12.5 mg Oral Daily  . atorvastatin  80 mg Oral QHS  . calcium carbonate  1,000 mg Oral TID  . Chlorhexidine Gluconate Cloth  6 each Topical Q0600  . cholecalciferol  2,000 Units Oral Daily  . feeding supplement (PRO-STAT SUGAR FREE 64)  30 mL Oral BID  . heparin  5,000 Units Subcutaneous Q8H  . insulin aspart  0-5 Units Subcutaneous QHS  . insulin aspart  0-6 Units Subcutaneous TID WC  . insulin aspart  5 Units Subcutaneous TID AC  . insulin glargine  10 Units Subcutaneous q AM  . lidocaine  1 patch Transdermal Daily  . melatonin  3 mg Oral QHS  . multivitamin  1 tablet Oral QHS  . multivitamin with minerals  1 tablet Oral q AM  . nutrition supplement (JUVEN)  1 packet Oral BID  . pantoprazole  40 mg Oral q AM  . polyethylene glycol  17 g Oral Daily  . rOPINIRole  0.5 mg Oral BID  . rOPINIRole  2 mg Oral BID  . senna-docusate  2 tablet Oral Daily  . thiamine  100 mg Oral Daily  . tiZANidine  4 mg Oral BID   Continuous Infusions: . sodium chloride    . sodium chloride    . cefTRIAXone (ROCEPHIN)  IV 2 g (01/14/20 1220)   PRN Meds:.sodium chloride, sodium chloride, acetaminophen, acetaminophen, alteplase, alum & mag hydroxide-simeth, heparin, Ipratropium-Albuterol, ipratropium-albuterol, lidocaine (PF), lidocaine-prilocaine, lidocaine-prilocaine, oxyCODONE-acetaminophen, pentafluoroprop-tetrafluoroeth  Current Labs: reviewed    Physical Exam:  Blood  pressure 123/71, pulse 85, temperature 98.4 F (36.9 C), temperature source Oral, resp. rate 20, height 5\' 5"  (1.651 m), weight 74 kg, SpO2 96 %. GEN: Chronically ill-appearing, NAD, lying in stretcher ENT: NCAT EYES: EOMI CV: Regular, no rub PULM: Diminished in the bases, normal work of breathing ABD: Soft, nontender SKIN: No rashes or lesions EXT: No significant edema  A 1. ESRD: HD off schedule 9/13 after contrast exposure and azotemia.    She will be stable for next treatment on 9/16 as outpatient if discharged, if here tomorrow will reassess.   2. UTI on ceftriaxone, blood cultures pending, per primary 3. HCAP, antibiotics as above, per primary; improving; defervesced.  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 As above . If discharged today, okay for next treatment on 9/16 . Medication Issues; o Preferred narcotic agents for pain control are hydromorphone, fentanyl, and methadone. Morphine should not be used.  o Baclofen should be avoided o Avoid oral sodium phosphate and magnesium citrate based laxatives / bowel preps    Pearson Grippe MD 01/15/2020, 9:55 AM  Recent Labs  Lab 01/13/20 1502  01/14/20 1016 01/15/20 0606  NA 136 134* 136  K 5.1 4.9 3.7  CL 96* 95* 94*  CO2 20* 21* 25  GLUCOSE 203* 123* 92  BUN 104* 114* 44*  CREATININE 9.23* 10.09* 5.09*  CALCIUM 8.8* 8.8* 8.9  PHOS 5.6*  --   --    Recent Labs  Lab 01/13/20 0219 01/13/20 0219 01/13/20 1502 01/14/20 1016 01/15/20 0606  WBC 16.2*   < > 16.1* 12.0* 7.8  NEUTROABS 14.7*  --   --   --   --   HGB 8.1*   < > 7.8* 7.8* 8.0*  HCT 25.1*   < > 25.0* 24.7* 25.4*  MCV 94.0   < > 94.3 94.3 93.7  PLT 221   < > 217 225 232   < > = values in this interval not displayed.

## 2020-01-16 DIAGNOSIS — L89153 Pressure ulcer of sacral region, stage 3: Secondary | ICD-10-CM

## 2020-01-16 DIAGNOSIS — Z992 Dependence on renal dialysis: Secondary | ICD-10-CM

## 2020-01-16 DIAGNOSIS — E1122 Type 2 diabetes mellitus with diabetic chronic kidney disease: Secondary | ICD-10-CM

## 2020-01-16 DIAGNOSIS — Z794 Long term (current) use of insulin: Secondary | ICD-10-CM

## 2020-01-16 LAB — CBC
HCT: 26.7 % — ABNORMAL LOW (ref 36.0–46.0)
Hemoglobin: 8.3 g/dL — ABNORMAL LOW (ref 12.0–15.0)
MCH: 29.1 pg (ref 26.0–34.0)
MCHC: 31.1 g/dL (ref 30.0–36.0)
MCV: 93.7 fL (ref 80.0–100.0)
Platelets: 259 10*3/uL (ref 150–400)
RBC: 2.85 MIL/uL — ABNORMAL LOW (ref 3.87–5.11)
RDW: 14.9 % (ref 11.5–15.5)
WBC: 6.2 10*3/uL (ref 4.0–10.5)
nRBC: 0 % (ref 0.0–0.2)

## 2020-01-16 LAB — BASIC METABOLIC PANEL
Anion gap: 18 — ABNORMAL HIGH (ref 5–15)
BUN: 62 mg/dL — ABNORMAL HIGH (ref 6–20)
CO2: 24 mmol/L (ref 22–32)
Calcium: 8.6 mg/dL — ABNORMAL LOW (ref 8.9–10.3)
Chloride: 95 mmol/L — ABNORMAL LOW (ref 98–111)
Creatinine, Ser: 7.28 mg/dL — ABNORMAL HIGH (ref 0.44–1.00)
GFR calc Af Amer: 7 mL/min — ABNORMAL LOW (ref >60)
GFR calc non Af Amer: 6 mL/min — ABNORMAL LOW (ref >60)
Glucose, Bld: 98 mg/dL (ref 70–99)
Potassium: 3.8 mmol/L (ref 3.5–5.1)
Sodium: 137 mmol/L (ref 135–145)

## 2020-01-16 LAB — GLUCOSE, CAPILLARY
Glucose-Capillary: 97 mg/dL (ref 70–99)
Glucose-Capillary: 94 mg/dL (ref 70–99)
Glucose-Capillary: 140 mg/dL — ABNORMAL HIGH (ref 70–99)

## 2020-01-16 MED ORDER — CEFDINIR 300 MG PO CAPS: 300.0000 mg | ORAL_CAPSULE | Freq: Two times a day (BID) | ORAL | 0 refills | Status: AC

## 2020-01-16 MED ORDER — ATENOLOL 25 MG PO TABS: 12.5000 mg | ORAL_TABLET | Freq: Every day | ORAL | Status: DC

## 2020-01-16 NOTE — Progress Notes (Signed)
Admit: 01/13/2020 LOS: 3  41F ESRD THS RUE AVF with [facility] here with UTI, pneumonia, sepsis.  Subjective:  . Feels improved, wants to discharge . On ceftriaxone . On 2 L nasal cannula, normal SPO2, hypoxemic on RA yesterdya  09/14 0701 - 09/15 0700 In: 240 [P.O.:240] Out: 450 [Urine:450]  Filed Weights   01/13/20 0111 01/14/20 1645 01/14/20 2311  Weight: 60 kg 75.5 kg 74 kg    Scheduled Meds: . amLODipine  10 mg Oral Daily  . ascorbic acid  500 mg Oral BID  . aspirin EC  81 mg Oral Daily  . atenolol  12.5 mg Oral Daily  . atorvastatin  80 mg Oral QHS  . calcium carbonate  1,000 mg Oral TID  . Chlorhexidine Gluconate Cloth  6 each Topical Q0600  . cholecalciferol  2,000 Units Oral Daily  . feeding supplement (PRO-STAT SUGAR FREE 64)  30 mL Oral BID  . heparin  5,000 Units Subcutaneous Q8H  . insulin aspart  0-5 Units Subcutaneous QHS  . insulin aspart  0-6 Units Subcutaneous TID WC  . insulin aspart  5 Units Subcutaneous TID AC  . insulin glargine  10 Units Subcutaneous q AM  . lidocaine  1 patch Transdermal Daily  . melatonin  3 mg Oral QHS  . multivitamin  1 tablet Oral QHS  . multivitamin with minerals  1 tablet Oral q AM  . nutrition supplement (JUVEN)  1 packet Oral BID  . pantoprazole  40 mg Oral q AM  . polyethylene glycol  17 g Oral Daily  . rOPINIRole  0.5 mg Oral BID  . rOPINIRole  2 mg Oral BID  . senna-docusate  2 tablet Oral Daily  . thiamine  100 mg Oral Daily  . tiZANidine  4 mg Oral BID   Continuous Infusions: . sodium chloride    . sodium chloride    . cefTRIAXone (ROCEPHIN)  IV 2 g (01/15/20 1050)   PRN Meds:.sodium chloride, sodium chloride, acetaminophen, acetaminophen, alteplase, alum & mag hydroxide-simeth, heparin, Ipratropium-Albuterol, ipratropium-albuterol, lidocaine (PF), lidocaine-prilocaine, lidocaine-prilocaine, oxyCODONE-acetaminophen, pentafluoroprop-tetrafluoroeth  Current Labs: reviewed    Physical Exam:  Blood pressure  140/86, pulse 84, temperature 98.4 F (36.9 C), resp. rate 20, height 5\' 5"  (1.651 m), weight 74 kg, SpO2 98 %. GEN: Chronically ill-appearing, NAD, lying in bed ENT: NCAT EYES: EOMI CV: Regular, no rub PULM: Diminished in the bases, normal work of breathing ABD: Soft, nontender SKIN: No rashes or lesions EXT: No significant edema RUA AVF +B/T  A 1. ESRD: HD off schedule 9/13 after contrast exposure and azotemia.    She will be stable for next treatment on 9/16 as outpatient if discharged, will provide HD here tomorrow if inpatient and can't DC in time to get to outpt HD 2. UTI on ceftriaxone, blood cultures NGTD, per primary 3. HCAP, antibiotics as above, per primary; improving; defervesced.  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 As above . Next tx 9/16, preferably as outpt . Will check in again tomorrow AM . Medication Issues; o Preferred narcotic agents for pain control are hydromorphone, fentanyl, and methadone. Morphine should not be used.  o Baclofen should be avoided o Avoid oral sodium phosphate and magnesium citrate based laxatives / bowel preps    Pearson Grippe MD 01/16/2020, 9:17  AM  Recent Labs  Lab 01/13/20 1502 01/13/20 1502 01/14/20 1016 01/15/20 0606 01/16/20 0617  NA 136   < > 134* 136 137  K 5.1   < > 4.9 3.7 3.8  CL 96*   < > 95* 94* 95*  CO2 20*   < > 21* 25 24  GLUCOSE 203*   < > 123* 92 98  BUN 104*   < > 114* 44* 62*  CREATININE 9.23*   < > 10.09* 5.09* 7.28*  CALCIUM 8.8*   < > 8.8* 8.9 8.6*  PHOS 5.6*  --   --   --   --    < > = values in this interval not displayed.   Recent Labs  Lab 01/13/20 0219 01/13/20 1502 01/14/20 1016 01/15/20 0606 01/16/20 0617  WBC 16.2*   < > 12.0* 7.8 6.2  NEUTROABS 14.7*  --   --   --   --    HGB 8.1*   < > 7.8* 8.0* 8.3*  HCT 25.1*   < > 24.7* 25.4* 26.7*  MCV 94.0   < > 94.3 93.7 93.7  PLT 221   < > 225 232 259   < > = values in this interval not displayed.

## 2020-01-16 NOTE — TOC Transition Note (Addendum)
Transition of Care [facility]) - CM/SW Discharge Note   Patient Details  Name: [name] Barnett MRN: [id number] Date of Birth: Aug 02, 1980  [facility] of [facility]) CM/SW Contact:  Iona Beard, Lead Phone Number: 01/16/2020, 3:47 PM   Clinical Narrative:    [facility] sent discharge orders and summary and FL2 in hub to [facility]. TOC spoke with Melissa at [facility] to confirm pts return. Pt ready for d/c. EMS scheduled will be later tonight due to trucks out of area. CSW let nurse know of delay, nurse asked CSW to call pt due to her wanting to leave ASAP. CSW spoke with pt. Pt adament that she will not be waiting until tonight to leave. CSW informed pt that EMS was unable to come at this time and EMS will pick up the pt as soon as an ambulance is avaliable. TOC signing off.    Final next level of care: [place] Barriers to Discharge: Continued Medical Work up   Patient Goals and CMS Choice Patient states their goals for this hospitalization and ongoing recovery are:: Return to [facility]   Choice offered to / list presented to : NA  Discharge Placement                Patient to be transferred to facility by: EMS   Patient and family notified of of transfer: 01/16/20  Discharge Plan and Services     Post Acute Care Choice: [place]: NA [facility] Agency: NA        Social Determinants of Health (SDOH) Interventions     Readmission Risk Interventions No flowsheet data found.

## 2020-01-16 NOTE — Progress Notes (Signed)
Sacral dressing changed per MD order.  Patient tolerated well.  Will continue to monitor.

## 2020-01-16 NOTE — Discharge Summary (Signed)
Physician Discharge Summary  [name] Barnett ZES:[id number] DOB: [birth year] DOA: 01/13/2020  PCP: Julianne Handler, DO  Admit date: 01/13/2020 Discharge date: 01/16/2020  Time spent: 35 minutes  Recommendations for Outpatient Follow-up:  1. Reassess blood pressure and adjust antihypertensive regimen as needed 2. Repeat basic metabolic panel to follow electrolytes and renal function 3. Repeat chest x-ray in 6 weeks to assure complete resolution of infiltrates.   Discharge Diagnoses:  Principal Problem:   Sepsis secondary to UTI [facility]) Active Problems:   Acute respiratory failure with hypoxia (HCC)   Right leg swelling   Sacral decubitus ulcer   Anemia due to end stage renal disease ([place])   Essential hypertension   Hyperlipidemia   Type 2 diabetes mellitus ([place])   Discharge Condition: Stable and improved.  Discharge back to skilled nursing facility for further care and rehabilitation. Outpatient follow-up with PCP and orthopedic service has been encouraged.  CODE STATUS: Full code.  Diet recommendation: Low-sodium, renal/modified carbohydrate diet.  Filed Weights   01/13/20 0111 01/14/20 1645 01/14/20 2311  Weight: 60 kg 75.5 kg 74 kg    History of present illness:  As per H&P written by Dr. Rhys Martini on 01/13/2020 [name] Barnett is a 39 y.o. female with medical history significant for chronic systolic congestive heart failure (LVEF done on 03/09/2019 showed EF of 45 to 50% with mild decrease function of LV and global moderately reduced systolic function of RV), type 2 diabetes mellitus, end-stage renal disease on HD, GERD, hypertension who is currently going through Rehab at [facility].  Patient complained of about 3-day onset of fever and burning sensation on urination.  She was unable to have dialysis yesterday due to not feeling well.  Patient states that she received Tylenol last night due to fever at her rehab center.  She denies any known sick contact, she denies  chest pain, nausea, vomiting, diarrhea.  EMS was activated by [facility] staff, on arrival of EMS, she was noted to be hypoxic with O2 sat in low 80s on room air, so she was placed on supplemental oxygen at three LPM via [place].  Patient recently had a nondisplaced right ankle distal fibula fracture without change in ankle mortise.  ED Course:  In the emergency department, she was febrile with a temperature of 102.76F, tachycardic, tachypneic, patient was also hypoxic and required supplemental oxygen via [place] at 3 PM to maintain O2 sat of 97-100%.  She was noted to be diaphoretic.  Work-up in the ED showed leukocytosis, BUN to creatinine at 91/8.32, albumin 3.4, urinalysis showed proteinuria, large leukocytes, WBC >50. Normocytic anemia.  Chest x-ray showed no active pulmonary disease.  She was treated with IV Vanco and Zosyn, Tylenol was given.  Hospitalist was asked (for further evaluation and management.  Hospital Course:  1-sepsis present on admission secondary to healthcare associated pneumonia and UTI -Patient met sepsis criteria on admission please refer to H&P written by Dr. Josephine Cables for further details. -Patient was also noted to be hypoxic initially requiring oxygen supplementation and eventually transitioning to no oxygen needs prior to discharge. -Patient initially treated with Vancomycin and Zosyn with subsequent de-escalation to ceftriaxone for 24 hours and after improvement/resolution of septic features discharge on cefdinir orally to complete antibiotic management. -Patient afebrile, with normal WBCs and in no acute distress.  2-hypoxia -Secondary to pneumonia as mentioned above -Elevated D-dimer at 3.56 with negative lower extremity Dopplers for DVT. -CT angio of the chest rule out pulmonary embolism and demonstrated right lower lobe  pneumonia and atelectasis in the left lower lobe.  3-Sacral decubitus ulcer stage III -Please refer for photos in epic for medial display. -Will continue  to follow wound care services recommendations and encourage constant repositioning to decrease pressure. -No signs of superimposed infection currently.  4-end-stage renal disease -Chronically on Tuesday Thursday and Saturday -Case discussed with nephrology service at this time safe to be discharge with dialysis resumption 01/17/2020 as previously scheduled. -No requiring oxygen therapy at time of discharge.  5-essential hypertension -Continue treatment with atenolol and amlodipine -Low-sodium diet has been encouraged -Reassess blood pressure and further adjust antihypertensive regimen as needed.  6-Type 2 diabetes mellitus/hyperlipidemia -Resume home hypoglycemic regimen -Continue the use of statins. -Modified carbohydrate diet has been emphasized.  7-anemia due to end-stage renal disease -Baseline around 8 -No signs of acute bleeding appreciated -Continue IV iron and Epogen therapy as per nephrology service discretion. -No transfusion required during this admission.  8-right nondisplaced ankle distal fibula fracture -Continue as needed analgesics -Continue wearing stabilizing boot and follow-up with orthopedic service as an outpatient.   Procedures:  See below for x-ray reports  Hemodialysis while inpatient 01/14/20.  Consultations:  Nephrology service  Discharge Exam: Vitals:   01/16/20 0431 01/16/20 1405  BP: 140/86 (!) 148/93  Pulse: 84 79  Resp: 20   Temp: 98.4 F (36.9 C) 98.4 F (36.9 C)  SpO2: 98% 99%    General: no requiring oxygen supplementation, still feeling weak and short winded with activity. No fever, no nausea, no vomiting and ready to be discharge.  Cardiovascular: S1 and S2, no rubs, no gallops. No JVD Respiratory: improved air movement bilaterally, positive scattered rhonchi, no wheezing, no using accessory muscles.  Abdomen: soft, no distension, no abdominal pain. Extremities: trace edema bilaterally  Discharge Instructions   Discharge  Instructions    Diet - low sodium heart healthy   Complete by: As directed    Discharge instructions   Complete by: As directed    Follow low-sodium, renal/modified carbohydrate diet. Maintain adequate hydration Be compliant with dialysis and treatment tomorrow 01/17/2020 as previously arranged. Outpatient follow-up with orthopedic service. Follow-up with PCP in 2 weeks.   Discharge wound care:   Complete by: As directed    Cleaned with normal saline, pat gently dry.  Please asked size appropriate for repeat of Cerefolin because over the open area.  Top with a dry gauze 2 x 2 cover with silicone foam dressing for sacrum. Turn patient from side to side to minimize time in the supine position and relieve pressure.     Allergies as of 01/16/2020      Reactions   Corn-containing Products    Lisinopril    Morphine And Related       Medication List    STOP taking these medications   HYDROcodone-acetaminophen 5-325 MG tablet Commonly known as: NORCO/VICODIN     TAKE these medications   acetaminophen 325 MG tablet Commonly known as: TYLENOL Take 650 mg by mouth every 6 (six) hours as needed.   amLODipine 10 MG tablet Commonly known as: NORVASC Take 1 tablet by mouth daily. Give on Tues, thurs, sat, sun   ascorbic acid 500 MG tablet Commonly known as: VITAMIN C Take 500 mg by mouth 2 (two) times daily.   Aspercreme Lidocaine 4 % Ptch Generic drug: Lidocaine Apply 1 patch topically daily. Apply to back   aspirin EC 81 MG tablet Take 81 mg by mouth daily. Swallow whole.   atenolol 25 MG tablet Commonly  known as: TENORMIN Take 0.5 tablets (12.5 mg total) by mouth daily. Start taking on: January 17, 2020   atorvastatin 80 MG tablet Commonly known as: LIPITOR Take 1 tablet by mouth at bedtime.   b complex-vitamin c-folic acid 0.8 MG Tabs tablet Take 1 tablet by mouth daily.   Bentyl 10 MG capsule Generic drug: dicyclomine Take 10 mg by mouth in the morning, at noon,  and at bedtime.   calcium elemental as carbonate 400 MG chewable tablet Commonly known as: BARIATRIC TUMS ULTRA Chew 1,000 mg by mouth 3 (three) times daily. Before meals   cefdinir 300 MG capsule Commonly known as: OMNICEF Take 1 capsule (300 mg total) by mouth 2 (two) times daily for 5 days.   CERTAGEN PO Take 1 tablet by mouth in the morning.   feeding supplement (PRO-STAT SUGAR FREE 64) Liqd Take 30 mLs by mouth in the morning and at bedtime.   insulin aspart 100 UNIT/ML injection Commonly known as: novoLOG Inject 5 Units into the skin 3 (three) times daily before meals.   insulin glargine 100 UNIT/ML Solostar Pen Commonly known as: LANTUS Inject 10 Units into the skin in the morning.   ipratropium-albuterol 0.5-2.5 (3) MG/3ML Soln Commonly known as: DUONEB Take 3 mLs by nebulization every 4 (four) hours as needed.   Combivent Respimat 20-100 MCG/ACT Aers respimat Generic drug: Ipratropium-Albuterol Inhale 1 puff into the lungs every 4 (four) hours as needed for wheezing or shortness of breath.   lidocaine-prilocaine cream Commonly known as: EMLA Apply 1 application topically daily as needed. Apply to HD graft site right arm topically one every tues, thurs, sat for pain.   melatonin 3 MG Tabs tablet Take 1 tablet by mouth at bedtime.   nutrition supplement (JUVEN) Pack Take 1 packet by mouth 2 (two) times daily.   oxyCODONE-acetaminophen 5-325 MG tablet Commonly known as: PERCOCET/ROXICET Take 1 tablet by mouth daily as needed for severe pain (For breakthrough pain).   pantoprazole 40 MG tablet Commonly known as: PROTONIX Take 40 mg by mouth in the morning.   polyethylene glycol 17 g packet Commonly known as: MIRALAX / GLYCOLAX Take 17 g by mouth daily.   rOPINIRole 0.5 MG tablet Commonly known as: REQUIP Take 0.5 mg by mouth 2 (two) times daily.   rOPINIRole 2 MG tablet Commonly known as: REQUIP Take 2 mg by mouth 2 (two) times daily.    sennosides-docusate sodium 8.6-50 MG tablet Commonly known as: SENOKOT-S Take 2 tablets by mouth daily.   thiamine 100 MG tablet Take 100 mg by mouth daily.   tiZANidine 4 MG tablet Commonly known as: ZANAFLEX Take 4 mg by mouth 2 (two) times daily.   Vitamin D 50 MCG (2000 UT) Caps Take 1 capsule by mouth daily.            Discharge Care Instructions  (From admission, onward)         Start     Ordered   01/16/20 0000  Discharge wound care:       Comments: Cleaned with normal saline, pat gently dry.  Please asked size appropriate for repeat of Cerefolin because over the open area.  Top with a dry gauze 2 x 2 cover with silicone foam dressing for sacrum. Turn patient from side to side to minimize time in the supine position and relieve pressure.   01/16/20 1442         Allergies  Allergen Reactions  . Corn-Containing Products   . Lisinopril   .  Morphine And Related     The results of significant diagnostics from this hospitalization (including imaging, microbiology, ancillary and laboratory) are listed below for reference.    Significant Diagnostic Studies: CT ANGIO CHEST PE W OR WO CONTRAST  Result Date: 01/14/2020 CLINICAL DATA:  Chest pain or shortness of breath.  Fever. EXAM: CT ANGIOGRAPHY CHEST WITH CONTRAST TECHNIQUE: Multidetector CT imaging of the chest was performed using the standard protocol during bolus administration of intravenous contrast. Multiplanar CT image reconstructions and MIPs were obtained to evaluate the vascular anatomy. CONTRAST:  133m OMNIPAQUE IOHEXOL 350 MG/ML SOLN COMPARISON:  None similar FINDINGS: Cardiovascular: Cardiomegaly without pericardial effusion. LAD stenting. Aberrant right subclavian artery. No pulmonary artery filling defects, limited at the bases due to motion. Mediastinum/Nodes: Mild hilar nodal enlargement, likely reactive. Lungs/Pleura: Streaky opacity in the left lower lobe with volume loss. Patchy ground-glass opacity  in the right lower lobe. Trace pleural fluid on the right. No pneumothorax. Upper Abdomen: No acute finding Musculoskeletal: No acute or aggressive finding Review of the MIP images confirms the above findings. IMPRESSION: 1. Airspace disease in the right lower lobe, likely pneumonia in the setting of fever. Atelectasis in the left lower lobe. 2. No evidence of pulmonary embolism. 3. Cardiomegaly. Electronically Signed   By: JMonte FantasiaM.D.   On: 01/14/2020 05:29   UKoreaVenous Img Lower Bilateral (DVT)  Result Date: 01/13/2020 CLINICAL DATA:  Bilateral lower extremity edema EXAM: BILATERAL LOWER EXTREMITY VENOUS DUPLEX ULTRASOUND TECHNIQUE: Gray-scale sonography with graded compression, as well as color Doppler and duplex ultrasound were performed to evaluate the lower extremity deep venous systems from the level of the common femoral vein and including the common femoral, femoral, profunda femoral, popliteal and calf veins including the posterior tibial, peroneal and gastrocnemius veins when visible. The superficial great saphenous vein was also interrogated. Spectral Doppler was utilized to evaluate flow at rest and with distal augmentation maneuvers in the common femoral, femoral and popliteal veins. COMPARISON:  None. FINDINGS: RIGHT LOWER EXTREMITY Common Femoral Vein: No evidence of thrombus. Normal compressibility, respiratory phasicity and response to augmentation. Saphenofemoral Junction: No evidence of thrombus. Normal compressibility and flow on color Doppler imaging. Profunda Femoral Vein: No evidence of thrombus. Normal compressibility and flow on color Doppler imaging. Femoral Vein: No evidence of thrombus. Normal compressibility, respiratory phasicity and response to augmentation. Popliteal Vein: No evidence of thrombus. Normal compressibility, respiratory phasicity and response to augmentation. Calf Veins: No evidence of thrombus. Normal compressibility and flow on color Doppler imaging.  Superficial Great Saphenous Vein: No evidence of thrombus. Normal compressibility. Venous Reflux:  None. Other Findings:  None. LEFT LOWER EXTREMITY Common Femoral Vein: No evidence of thrombus. Normal compressibility, respiratory phasicity and response to augmentation. Saphenofemoral Junction: No evidence of thrombus. Normal compressibility and flow on color Doppler imaging. Profunda Femoral Vein: No evidence of thrombus. Normal compressibility and flow on color Doppler imaging. Femoral Vein: No evidence of thrombus. Normal compressibility, respiratory phasicity and response to augmentation. Popliteal Vein: No evidence of thrombus. Normal compressibility, respiratory phasicity and response to augmentation. Calf Veins: No evidence of thrombus. Normal compressibility and flow on color Doppler imaging. Superficial Great Saphenous Vein: No evidence of thrombus. Normal compressibility. Venous Reflux:  None. Other Findings:  None. IMPRESSION: No evidence of deep venous thrombosis in either lower extremity. Electronically Signed   By: WLowella GripIII M.D.   On: 01/13/2020 10:24   DG Chest Port 1 View  Result Date: 01/13/2020 CLINICAL DATA:  Fever and diaphoresis.  EXAM: PORTABLE CHEST 1 VIEW COMPARISON:  05/29/2019 FINDINGS: Cardiac enlargement with prominence of the pulmonary outflow tract. Linear scarring or atelectasis in the right mid lung, similar to previous study. No focal consolidation. No pleural effusions. No pneumothorax. IMPRESSION: Cardiac enlargement. No evidence of active pulmonary disease. Scarring persists in the right middle lung without change. Electronically Signed   By: Lucienne Capers M.D.   On: 01/13/2020 02:44    Microbiology: Recent Results (from the past 240 hour(s))  SARS Coronavirus 2 by RT PCR (hospital order, performed in [facility] hospital lab) Nasopharyngeal Nasopharyngeal Swab     Status: None   Collection Time: 01/13/20  1:50 AM   Specimen: Nasopharyngeal Swab  Result  Value Ref Range Status   SARS Coronavirus 2 NEGATIVE NEGATIVE Final    Comment: (NOTE) SARS-CoV-2 target nucleic acids are NOT DETECTED.  The SARS-CoV-2 RNA is generally detectable in upper and lower respiratory specimens during the acute phase of infection. The lowest concentration of SARS-CoV-2 viral copies this assay can detect is 250 copies / mL. A negative result does not preclude SARS-CoV-2 infection and should not be used as the sole basis for treatment or other patient management decisions.  A negative result may occur with improper specimen collection / handling, submission of specimen other than nasopharyngeal swab, presence of viral mutation(s) within the areas targeted by this assay, and inadequate number of viral copies (<250 copies / mL). A negative result must be combined with clinical observations, patient history, and epidemiological information.  Fact Sheet for Patients:   StrictlyIdeas.no  Fact Sheet for Healthcare Providers: BankingDealers.co.za  This test is not yet approved or  cleared by the [place] FDA and has been authorized for detection and/or diagnosis of SARS-CoV-2 by FDA under an Emergency Use Authorization (EUA).  This EUA will remain in effect (meaning this test can be used) for the duration of the COVID-19 declaration under Section 564(b)(1) of the Act, 21 U.S.C. section 360bbb-3(b)(1), unless the authorization is terminated or revoked sooner.  Performed at [facility], [address], [place], [place] [address]   Culture, blood (Routine x 2)     Status: None (Preliminary result)   Collection Time: 01/13/20  2:19 AM   Specimen: Neck; Blood  Result Value Ref Range Status   Specimen Description NECK  Final   Special Requests   Final    BOTTLES DRAWN AEROBIC ONLY Blood Culture adequate volume   Culture   Final    NO GROWTH 3 DAYS Performed at [facility], [address], [place], [place]  [address]    Report Status PENDING  Incomplete  Culture, blood (Routine x 2)     Status: None (Preliminary result)   Collection Time: 01/13/20  2:23 AM   Specimen: Neck; Blood  Result Value Ref Range Status   Specimen Description NECK  Final   Special Requests   Final    BOTTLES DRAWN AEROBIC ONLY Blood Culture adequate volume   Culture   Final    NO GROWTH 3 DAYS Performed at [facility], [address] [place], [place] [address]    Report Status PENDING  Incomplete  Urine culture     Status: Abnormal   Collection Time: 01/13/20  2:54 AM   Specimen: In/Out Cath Urine  Result Value Ref Range Status   Specimen Description   Final    IN/OUT CATH URINE Performed at [facility], [address] [place], [place] [address]    Special Requests   Final  NONE Performed at [facility], [address], [place], [place] [address]    Culture MULTIPLE SPECIES PRESENT, SUGGEST RECOLLECTION (A)  Final   Report Status 01/14/2020 FINAL  Final  MRSA PCR Screening     Status: None   Collection Time: 01/13/20  7:51 AM   Specimen: Nasal Mucosa; Nasopharyngeal  Result Value Ref Range Status   MRSA by PCR NEGATIVE NEGATIVE Final    Comment:        The GeneXpert MRSA Assay (FDA approved for NASAL specimens only), is one component of a comprehensive MRSA colonization surveillance program. It is not intended to diagnose MRSA infection nor to guide or monitor treatment for MRSA infections. Performed at [facility], [address], [place], [place] [address]      Labs: Basic Metabolic Panel: Recent Labs  Lab 01/13/20 0219 01/13/20 1502 01/14/20 1016 01/15/20 0606 01/16/20 0617  NA 137 136 134* 136 137  K 4.8 5.1 4.9 3.7 3.8  CL 98 96* 95* 94* 95*  CO2 20* 20* 21* 25 24  GLUCOSE 95 203* 123* 92 98  BUN 91* 104* 114* 44* 62*  CREATININE 8.32* 9.23* 10.09* 5.09* 7.28*  CALCIUM 9.1 8.8* 8.8* 8.9 8.6*  PHOS  --  5.6*  --   --   --    Liver Function Tests: Recent Labs  Lab  01/13/20 0219 01/13/20 1502 01/14/20 1016  AST 13*  --  13*  ALT 20  --  38  ALKPHOS 92  --  97  BILITOT 1.2  --  0.6  PROT 7.6  --  6.9  ALBUMIN 3.4* 3.2* 3.0*   CBC: Recent Labs  Lab 01/13/20 0219 01/13/20 1502 01/14/20 1016 01/15/20 0606 01/16/20 0617  WBC 16.2* 16.1* 12.0* 7.8 6.2  NEUTROABS 14.7*  --   --   --   --   HGB 8.1* 7.8* 7.8* 8.0* 8.3*  HCT 25.1* 25.0* 24.7* 25.4* 26.7*  MCV 94.0 94.3 94.3 93.7 93.7  PLT 221 217 225 232 259   CBG: Recent Labs  Lab 01/15/20 1123 01/15/20 1657 01/15/20 2156 01/16/20 0735 01/16/20 1119  GLUCAP 85 86 96 97 94    Signed:  Barton Dubois MD.  [facility] 01/16/2020, 2:43 PM

## 2020-01-16 NOTE — NC FL2 (Signed)
[place] LEVEL OF CARE SCREENING TOOL     IDENTIFICATION  Patient Name: [name] Barnett Birthdate: 1981-02-22 Sex: female Admission Date (Current Location): 01/13/2020  [place] and [place] Number:  [facility] [id number] [place] and Address:  [facility] [address] [place]      Provider Number: [id number]  Attending Physician Name and Address:  Barton Dubois, MD  Relative Name and Phone Number:  Terance Hart [phone]    Current Level of Care: Hospital Recommended Level of Care: [place] Prior Approval Number:    Date Approved/Denied:   PASRR Number:    Discharge Plan: SNF    Current Diagnoses: Patient Active Problem List   Diagnosis Date Noted   Sepsis secondary to UTI ([place]) 01/13/2020   Acute respiratory failure with hypoxia ([place]) 01/13/2020   Right leg swelling 01/13/2020   Sacral decubitus ulcer 01/13/2020   Anemia due to end stage renal disease ([place]) 01/13/2020   Essential hypertension 01/13/2020   Hyperlipidemia 01/13/2020   Type 2 diabetes mellitus ([place]) 01/13/2020    Orientation RESPIRATION BLADDER Height & Weight     Self, Time, Situation, Place  O2 (3L)   Weight: 163 lb 2.3 oz (74 kg) Height:  5\' 5"  (165.1 cm)  BEHAVIORAL SYMPTOMS/MOOD NEUROLOGICAL BOWEL NUTRITION STATUS      Colostomy Diet (Renal diet with fluid restriction (1200 mL))  AMBULATORY STATUS COMMUNICATION OF NEEDS Skin   Extensive Assist (Pt is primarily in manual wheelchair) Verbally PU Stage and Appropriate Care (gluteal cleft/coccygeal area)     PU Stage 3 Dressing: Daily                 Personal Care Assistance Level of Assistance              Functional Limitations Info  Sight, Hearing, Speech     Speech Info: Adequate    SPECIAL CARE FACTORS FREQUENCY                       Contractures      Additional Factors Info  Code Status, Allergies, Insulin Sliding Scale Code Status  Info: Full Allergies Info: Corn-containing products, Lisinopril, Morphine and related   Insulin Sliding Scale Info: 3 times daily before meals: CBG < 70: Implement Hypoglycemia Standing Orders and refer to Hypoglycemia Standing Orders sidebar report; CBG 70 - 120: 0 units; CBG 121 - 150: 0 units; CBG 151 - 200: 1 unit; CBG 201-250: 2 units; CBG 251-300: 3 units; CBG 301-350: 4 units; CBG 351-400: 5 units; CBG > 400 Give 6 units and call MD; Daily at bedtime: CBG < 70: Implement Hypoglycemia Standing Orders and refer to Hypoglycemia Standing Orders sidebar report; CBG 70 - 120: 0 units; CBG 121 - 150: 0 units; CBG 151 - 200: 0 units; CBG 201 - 250: 2 units; CBG 251 - 300: 3 units; CBG 301 - 350: 4 units; CBG 351 - 400: 5 units; CBG > 400 call MD and obtain STAT lab verification       Current Medications (01/16/2020):  This is the current hospital active medication list Current Facility-Administered Medications  Medication Dose Route Frequency Provider Last Rate Last Admin   0.9 %  sodium chloride infusion  100 mL Intravenous PRN Emokpae, Courage, MD       0.9 %  sodium chloride infusion  100 mL Intravenous PRN Emokpae, Courage, MD       acetaminophen (TYLENOL) suppository 650 mg  650  mg Rectal Q6H PRN Roxan Hockey, MD   650 mg at 01/13/20 1245   acetaminophen (TYLENOL) tablet 650 mg  650 mg Oral Q6H PRN Emokpae, Courage, MD       alteplase (CATHFLO ACTIVASE) injection 2 mg  2 mg Intracatheter Once PRN Roxan Hockey, MD       alum & mag hydroxide-simeth (MAALOX/MYLANTA) 200-200-20 MG/5ML suspension 30 mL  30 mL Oral Q6H PRN Matcha, Anupama, MD   30 mL at 01/14/20 2311   amLODipine (NORVASC) tablet 10 mg  10 mg Oral Daily Emokpae, Courage, MD   10 mg at 01/16/20 1023   ascorbic acid (VITAMIN C) tablet 500 mg  500 mg Oral BID Emokpae, Courage, MD   500 mg at 01/16/20 1022   aspirin EC tablet 81 mg  81 mg Oral Daily Emokpae, Courage, MD   81 mg at 01/16/20 1031   atenolol (TENORMIN)  tablet 12.5 mg  12.5 mg Oral Daily Emokpae, Courage, MD   12.5 mg at 01/16/20 1021   atorvastatin (LIPITOR) tablet 80 mg  80 mg Oral QHS Emokpae, Courage, MD   80 mg at 01/15/20 2136   calcium carbonate (TUMS - dosed in mg elemental calcium) chewable tablet 1,000 mg  1,000 mg Oral TID D[place] Brick, Courage, MD   1,000 mg at 01/16/20 1030   cefTRIAXone (ROCEPHIN) 2 g in sodium chloride 0.9 % 100 mL IVPB  2 g Intravenous Daily Emokpae, Courage, MD 200 mL/hr at 01/16/20 1032 2 g at 01/16/20 1032   Chlorhexidine Gluconate Cloth 2 % PADS 6 each  6 each Topical Q0600 Emokpae, Courage, MD       cholecalciferol (VITAMIN D3) tablet 2,000 Units  2,000 Units Oral Daily Emokpae, Courage, MD   2,000 Units at 01/16/20 1024   feeding supplement (PRO-STAT SUGAR FREE 64) liquid 30 mL  30 mL Oral BID Emokpae, Courage, MD   30 mL at 01/15/20 2140   heparin injection 1,000 Units  1,000 Units Dialysis PRN Roxan Hockey, MD       heparin injection 5,000 Units  5,000 Units Subcutaneous Q8H Emokpae, Courage, MD   5,000 Units at 01/16/20 0517   insulin aspart (novoLOG) injection 0-5 Units  0-5 Units Subcutaneous QHS Emokpae, Courage, MD       insulin aspart (novoLOG) injection 0-6 Units  0-6 Units Subcutaneous TID WC Emokpae, Courage, MD   1 Units at 01/14/20 0830   insulin aspart (novoLOG) injection 5 Units  5 Units Subcutaneous TID AC Emokpae, Courage, MD   5 Units at 01/14/20 0830   insulin glargine (LANTUS) injection 10 Units  10 Units Subcutaneous q AM Emokpae, Courage, MD   10 Units at 01/15/20 1014   Ipratropium-Albuterol (COMBIVENT) respimat 1 puff  1 puff Inhalation Q4H PRN Emokpae, Courage, MD       ipratropium-albuterol (DUONEB) 0.5-2.5 (3) MG/3ML nebulizer solution 3 mL  3 mL Nebulization Q4H PRN Emokpae, Courage, MD       lidocaine (LIDODERM) 5 % 1 patch  1 patch Transdermal Daily Emokpae, Courage, MD   1 patch at 01/16/20 1028   lidocaine (PF) (XYLOCAINE) 1 % injection 5 mL  5 mL Intradermal PRN  Emokpae, Courage, MD       lidocaine-prilocaine (EMLA) cream 1 application  1 application Topical PRN Emokpae, Courage, MD       lidocaine-prilocaine (EMLA) cream 1 application  1 application Topical Daily PRN Emokpae, Courage, MD       melatonin tablet 3 mg  3 mg Oral QHS  Roxan Hockey, MD   3 mg at 01/15/20 2136   multivitamin (RENA-VIT) tablet 1 tablet  1 tablet Oral QHS Roxan Hockey, MD   1 tablet at 01/15/20 2134   multivitamin with minerals tablet 1 tablet  1 tablet Oral q AM Roxan Hockey, MD   1 tablet at 01/16/20 1044   nutrition supplement (JUVEN) (JUVEN) powder packet 1 packet  1 packet Oral BID Roxan Hockey, MD   1 packet at 01/16/20 1028   oxyCODONE-acetaminophen (PERCOCET/ROXICET) 5-325 MG per tablet 1 tablet  1 tablet Oral Daily PRN Roxan Hockey, MD   1 tablet at 01/15/20 2136   pantoprazole (PROTONIX) EC tablet 40 mg  40 mg Oral q AM Emokpae, Courage, MD   40 mg at 01/16/20 1044   pentafluoroprop-tetrafluoroeth (GEBAUERS) aerosol 1 application  1 application Topical PRN Emokpae, Courage, MD       polyethylene glycol (MIRALAX / GLYCOLAX) packet 17 g  17 g Oral Daily Emokpae, Courage, MD   17 g at 01/15/20 1020   rOPINIRole (REQUIP) tablet 0.5 mg  0.5 mg Oral BID Emokpae, Courage, MD   0.5 mg at 01/16/20 1028   rOPINIRole (REQUIP) tablet 2 mg  2 mg Oral BID Emokpae, Courage, MD   2 mg at 01/16/20 1029   senna-docusate (Senokot-S) tablet 2 tablet  2 tablet Oral Daily Emokpae, Courage, MD   2 tablet at 01/15/20 1016   thiamine tablet 100 mg  100 mg Oral Daily Emokpae, Courage, MD   100 mg at 01/16/20 1029   tiZANidine (ZANAFLEX) tablet 4 mg  4 mg Oral BID Roxan Hockey, MD   4 mg at 01/16/20 1023     Discharge Medications: Please see discharge summary for a list of discharge medications.  Relevant Imaging Results:  Relevant Lab Results:   Additional Information SSN#: [id number]  [place]

## 2020-01-18 LAB — CULTURE, BLOOD (ROUTINE X 2)
Culture: NO GROWTH
Culture: NO GROWTH
Special Requests: ADEQUATE
Special Requests: ADEQUATE

## 2020-01-25 ENCOUNTER — Encounter (HOSPITAL_BASED_OUTPATIENT_CLINIC_OR_DEPARTMENT_OTHER): Payer: Medicare Other | Attending: Internal Medicine | Admitting: Internal Medicine

## 2020-01-25 DIAGNOSIS — F419 Anxiety disorder, unspecified: Secondary | ICD-10-CM | POA: Insufficient documentation

## 2020-01-25 DIAGNOSIS — I132 Hypertensive heart and chronic kidney disease with heart failure and with stage 5 chronic kidney disease, or end stage renal disease: Secondary | ICD-10-CM | POA: Insufficient documentation

## 2020-01-25 DIAGNOSIS — E1151 Type 2 diabetes mellitus with diabetic peripheral angiopathy without gangrene: Secondary | ICD-10-CM | POA: Diagnosis not present

## 2020-01-25 DIAGNOSIS — L89154 Pressure ulcer of sacral region, stage 4: Secondary | ICD-10-CM | POA: Insufficient documentation

## 2020-01-25 DIAGNOSIS — E1139 Type 2 diabetes mellitus with other diabetic ophthalmic complication: Secondary | ICD-10-CM | POA: Insufficient documentation

## 2020-01-25 DIAGNOSIS — E1122 Type 2 diabetes mellitus with diabetic chronic kidney disease: Secondary | ICD-10-CM | POA: Diagnosis not present

## 2020-01-25 DIAGNOSIS — D631 Anemia in chronic kidney disease: Secondary | ICD-10-CM | POA: Insufficient documentation

## 2020-01-25 DIAGNOSIS — I779 Disorder of arteries and arterioles, unspecified: Secondary | ICD-10-CM | POA: Diagnosis not present

## 2020-01-25 DIAGNOSIS — E1140 Type 2 diabetes mellitus with diabetic neuropathy, unspecified: Secondary | ICD-10-CM | POA: Insufficient documentation

## 2020-01-25 DIAGNOSIS — N186 End stage renal disease: Secondary | ICD-10-CM | POA: Insufficient documentation

## 2020-01-25 DIAGNOSIS — Z992 Dependence on renal dialysis: Secondary | ICD-10-CM | POA: Insufficient documentation

## 2020-01-25 DIAGNOSIS — H42 Glaucoma in diseases classified elsewhere: Secondary | ICD-10-CM | POA: Insufficient documentation

## 2020-01-25 DIAGNOSIS — E11622 Type 2 diabetes mellitus with other skin ulcer: Secondary | ICD-10-CM | POA: Insufficient documentation

## 2020-01-25 DIAGNOSIS — X58XXXA Exposure to other specified factors, initial encounter: Secondary | ICD-10-CM | POA: Diagnosis not present

## 2020-01-25 DIAGNOSIS — I5032 Chronic diastolic (congestive) heart failure: Secondary | ICD-10-CM | POA: Diagnosis not present

## 2020-01-25 DIAGNOSIS — T8131XD Disruption of external operation (surgical) wound, not elsewhere classified, subsequent encounter: Secondary | ICD-10-CM | POA: Diagnosis not present

## 2020-01-27 NOTE — Progress Notes (Signed)
[name] JADENE, STEMMER ([id number]) Visit Report for 01/25/2020 HPI Details Patient Name: Date of Service: [name] Barnett, [name] Barnett 01/25/2020 9:30 A M Medical Record Number: [id number] Patient Account Number: [id number] Date of Birth/Sex: Treating RN: 1980-06-28 (39 y.o. F) Dwiggins, [facility] Primary Care Provider: SITA FA LWA LLA, SHA RMIN Other Clinician: Referring Provider: Treating Provider/Extender: Linton Ham SITA FA LWA LLA, SHA RMIN Weeks in Treatment: 32 History of Present Illness HPI Description: Admission 06/15/2019 This is a 39 year old woman who comes to [place] from [facility] skilled facility in [place]. I am able to follow a lot of her history on care everywhere in epic. The patient is a type II diabetic and has been on dialysis. 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 I am not exactly sure of the nature of the stroke at this present time. She has made a decent recovery and is now up walking with a walker. In August 2020 she was admitted to [facility] in [place] with L4-L5 discitis. Culture and sensitivity showed Klebsiella. She was discharged on 8 weeks of ceftazidime. In mid September she is readmitted to the hospital with a pressure ulcer on her lower sacrum and necrotizing fasciitis. She required an extensive surgical debridement. CT scan of the pelvis at 1 point showed communication with the rectum. A colostomy was recommended and placed. Culture of this area showed Klebsiella and yeast. She was discharged on an extended course of meropenem and Diflucan. [facility] [place] We do not have any of these records. I think she was discharged to another nursing home but is come to [facility] skilled facility in [facility]. She was sent down here for review of the remaining wound. By review of the pictures that the patient had from  September this is made considerable improvement. She tells me that the wound VAC was discontinued at the end of December 2020. Currently the facility is using a form of silver alginate to the wound surface. Past medical history type 2 diabetes with chronic renal failure on dialysis Tuesday Thursday and Saturday. CVA with left hemiparesis in June 2020 but she appears to be making a good recovery. Chronic diastolic heart failure, hypertension 2/19; patient's wound area slightly smaller. The area that was towards her anus has closed over. We are using Santyl covered with Hydrofera Blue. We have not been able to get her lab work apparently her veins are very difficult for phlebotomy. They are trying to get the blood work done where she dialyzes in [facility] but that requires some logistical issues. 2/26; not much change in the wound. Still a nonviable surface we have been using Santyl and Hydrofera Blue. I changed her to [facility] only. She tells me she has rigorously offloading this area she also complains of some pain 3/5; still no change here. I switch to pure Hydrofera Blue. She still complains of a lot of pain although is a big wound it seems out of proportion to what I might expect. This was initially a surgical wound secondary to necrotizing wound infection. She is on dialysis. Other than that she thinks she is offloading this fairly rigorously. She has not been systemically unwell 3/26; I switched to Iodoflex 3 weeks ago. This really seems to have helped, much better surface and slight improvement in the surface area. 4/26; the patient is still on Iodoflex she missed an appointment 2 weeks  ago. Doing slightly smaller. She is on dialysis 5/10; using Hydrofera Blue. The major improvement here is the complete lack of depth. She has islands of epithelialization 09/24/19-Patient comes in at 2 weeks, dimensions about the same, there is an [place] of epithelium at margin of wound, no slough  noted 10/15/2019 upon evaluation today patient appears to be doing well with regard to her wound. She has been tolerating the dressing changes in the sacral region without complication and at this point she seems to be healing quite nicely. I am extremely pleased with how things are progressing and the epithelial tissue seems to be spreading from the central portion as well which is also excellent. 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 We have been using Hydrofera Blue. The smaller area which has depth at 0.7 cm there is a question about whether they have been packing anything into this or just flaring Hydrofera Blue over the top 9/23; its been more than 2 months since this patient was here. She still has a small area distally which is the remanent of her stage IV wound. They have been using Hydrofera Blue. She also has an area on the left gluteal Electronic Signature(s) Signed: 01/27/2020 6:30:39 AM By: Linton Ham MD Entered By: Linton Ham on 01/25/2020 10:33:01 -------------------------------------------------------------------------------- Physical Exam Details Patient Name: Date of Service: [name] Barnett, [name] Barnett 01/25/2020 9:30 A M Medical Record Number: [id number] Patient Account Number: [id number] Date of Birth/Sex: Treating RN: [birth year] (39 y.o. F) Dwiggins, [facility] Primary Care Provider: SITA FA LWA LLA, SHA RMIN Other Clinician: Referring Provider: Treating Provider/Extender: Linton Ham SITA FA LWA LLA, SHA RMIN Weeks in Treatment: 32 Constitutional Sitting or standing Blood Pressure is within target range for patient.. Pulse regular and within target range for patient.Marland Kitchen Respirations regular, non-labored and within target range.. Temperature is normal and within the target range for the patient.Marland Kitchen Appears in no distress. Notes Wound exam; the patient has 1 small area distally in the region of her very  large stage IV wound. This has 0.3 to 0.4 cm in depth healthy granulation. Superficial area on the left gluteal also looks a lot better Electronic Signature(s) Signed: 01/27/2020 6:30:39 AM By: Linton Ham MD Entered By: Linton Ham on 01/25/2020 10:35:17 -------------------------------------------------------------------------------- Physician Orders Details Patient Name: Date of Service: [name] Barnett, [name] Barnett 01/25/2020 9:30 [place] Record Number: [id number] Patient Account Number: [id number] Date of Birth/Sex: Treating RN: 12-20-1980 (39 y.o. F) Dwiggins, [facility] Primary Care Provider: SITA FA LWA LLA, SHA RMIN Other Clinician: Referring Provider: Treating Provider/Extender: Linton Ham SITA FA LWA LLA, SHA RMIN Weeks in Treatment: 57 Verbal / Phone Orders: No Diagnosis Coding Follow-up Appointments Return appointment in 1 month. Dressing Change Frequency Wound #1 Sacrum Change Dressing every other day. Skin Barriers/Peri-Wound Care Wound #1 Sacrum Skin Prep Wound Cleansing Wound #1 Sacrum Clean wound with Wound Cleanser - or normal saline Primary Wound Dressing Wound #1 Sacrum Silver Collagen - moistened saline 4x4 backing Wound #3 Gluteal fold Silver Collagen - moistened saline 4x4 backing Secondary Dressing Wound #1 Sacrum Foam Border - or ABD Off-Loading Turn and reposition every 2 hours Electronic Signature(s) Signed: 01/25/2020 5:26:06 PM By: Kela Millin Signed: 01/27/2020 6:30:39 AM By: Linton Ham MD Entered By: Kela Millin on 01/25/2020 10:07:54 -------------------------------------------------------------------------------- Problem List Details Patient Name: Date of Service: [name] DA SHIRON, WHETSEL 01/25/2020 9:30 A M Medical Record Number: [id number] Patient Account Number: [id number] Date of Birth/Sex: Treating RN:  [birth year] (39 y.o. F) Dwiggins, [facility] Primary Care Provider: SITA FA LWA LLA, SHA RMIN Other  Clinician: Referring Provider: Treating Provider/Extender: Linton Ham SITA FA LWA LLA, SHA RMIN Weeks in Treatment: 32 Active Problems ICD-10 Encounter Code Description Active Date MDM Diagnosis L89.154 Pressure ulcer of sacral region, stage 4 06/15/2019 No Yes T81.31XD Disruption of external operation (surgical) wound, not elsewhere classified, 06/15/2019 No Yes subsequent encounter Inactive Problems ICD-10 Code Description Active Date Inactive Date L89.152 Pressure ulcer of sacral region, stage 2 06/15/2019 06/15/2019 M72.6 Necrotizing fasciitis 06/15/2019 06/15/2019 Resolved Problems Electronic Signature(s) Signed: 01/27/2020 6:30:39 AM By: Linton Ham MD Entered By: Linton Ham on 01/25/2020 10:32:02 -------------------------------------------------------------------------------- Progress Note Details Patient Name: Date of Service: [name] Barnett, [name] Barnett 01/25/2020 9:30 [place] Number: [id number] Patient Account Number: [id number] Date of Birth/Sex: Treating RN: Jan 26, 1981 (39 y.o. F) Dwiggins, [facility] Primary Care Provider: SITA FA LWA LLA, SHA RMIN Other Clinician: Referring Provider: Treating Provider/Extender: Linton Ham SITA FA LWA LLA, SHA RMIN Weeks in Treatment: 32 Subjective History of Present Illness (HPI) Admission 06/15/2019 This is a 39 year old woman who comes to [place] from [facility] skilled facility in [facility]. I am able to follow a lot of her history on care everywhere in epic. The patient is a type II diabetic and has been on dialysis. 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 I am not exactly sure of the nature of the stroke at this present time. She has made a decent recovery and is now up walking with a walker. In August 2020 she was admitted to [facility] in [place] with L4-L5 discitis. Culture and sensitivity showed Klebsiella. She was  discharged on 8 weeks of ceftazidime. In mid September she is readmitted to the hospital with a pressure ulcer on her lower sacrum and necrotizing fasciitis. She required an extensive surgical debridement. CT scan of the pelvis at 1 point showed communication with the rectum. A colostomy was recommended and placed. Culture of this area showed Klebsiella and yeast. She was discharged on an extended course of meropenem and Diflucan. [facility] [place] We do not have any of these records. I think she was discharged to another nursing home but is come to [facility] skilled facility in [facility]. She was sent down here for review of the remaining wound. By review of the pictures that the patient had from September this is made considerable improvement. She tells me that the wound VAC was discontinued at the end of December 2020. Currently the facility is using a form of silver alginate to the wound surface. Past medical history type 2 diabetes with chronic renal failure on dialysis Tuesday Thursday and Saturday. CVA with left hemiparesis in June 2020 but she appears to be making a good recovery. Chronic diastolic heart failure, hypertension 2/19; patient's wound area slightly smaller. The area that was towards her anus has closed over. We are using Santyl covered with Hydrofera Blue. We have not been able to get her lab work apparently her veins are very difficult for phlebotomy. They are trying to get the blood work done where she dialyzes in [facility] but that requires some logistical issues. 2/26; not much change in the wound. Still a nonviable surface we have been using Santyl and Hydrofera Blue. I changed her to [facility] only. She tells me she has rigorously offloading this area she also  complains of some pain 3/5; still no change here. I switch to pure Hydrofera Blue. She still complains of a lot of pain although is a big  wound it seems out of proportion to what I might expect. This was initially a surgical wound secondary to necrotizing wound infection. She is on dialysis. Other than that she thinks she is offloading this fairly rigorously. She has not been systemically unwell 3/26; I switched to Iodoflex 3 weeks ago. This really seems to have helped, much better surface and slight improvement in the surface area. 4/26; the patient is still on Iodoflex she missed an appointment 2 weeks ago. Doing slightly smaller. She is on dialysis 5/10; using Hydrofera Blue. The major improvement here is the complete lack of depth. She has islands of epithelialization 09/24/19-Patient comes in at 2 weeks, dimensions about the same, there is an [place] of epithelium at margin of wound, no slough noted 10/15/2019 upon evaluation today patient appears to be doing well with regard to her wound. She has been tolerating the dressing changes in the sacral region without complication and at this point she seems to be healing quite nicely. I am extremely pleased with how things are progressing and the epithelial tissue seems to be spreading from the central portion as well which is also excellent. 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 We have been using Hydrofera Blue. The smaller area which has depth at 0.7 cm there is a question about whether they have been packing anything into this or just flaring Hydrofera Blue over the top 9/23; its been more than 2 months since this patient was here. She still has a small area distally which is the remanent of her stage IV wound. They have been using Hydrofera Blue. She also has an area on the left gluteal Objective Constitutional Sitting or standing Blood Pressure is within target range for patient.. Pulse regular and within target range for patient.Marland Kitchen Respirations regular, non-labored and within target range.. Temperature is normal and  within the target range for the patient.Marland Kitchen Appears in no distress. Vitals Time Taken: 9:44 AM, Height: 65 in, Weight: 130 lbs, BMI: 21.6, Temperature: 97.9 F, Pulse: 82 bpm, Respiratory Rate: 18 breaths/min, Blood Pressure: 135/85 mmHg. General Notes: Wound exam; the patient has 1 small area distally in the region of her very large stage IV wound. This has 0.3 to 0.4 cm in depth healthy granulation. ooSuperficial area on the left gluteal also looks a lot better Integumentary (Hair, Skin) Wound #1 status is Open. Original cause of wound was Pressure Injury. The wound is located on the Sacrum. The wound measures 0.3cm length x 0.5cm width x 0.4cm depth; 0.118cm^2 area and 0.047cm^3 volume. There is Fat Layer (Subcutaneous Tissue) exposed. There is no tunneling or undermining noted. There is a medium amount of serosanguineous drainage noted. The wound margin is thickened. There is large (67-100%) pink granulation within the wound bed. There is no necrotic tissue within the wound bed. Wound #3 status is Open. Original cause of wound was Gradually Appeared. The wound is located on the Gluteal fold. The wound measures 1.1cm length x 0.6cm width x 0.1cm depth; 0.518cm^2 area and 0.052cm^3 volume. There is Fat Layer (Subcutaneous Tissue) exposed. There is no tunneling or undermining noted. There is a medium amount of serosanguineous drainage noted. The wound margin is flat and intact. There is large (67-100%) pink granulation within the wound bed. There is no necrotic tissue  within the wound bed. Assessment Active Problems ICD-10 Pressure ulcer of sacral region, stage 4 Disruption of external operation (surgical) wound, not elsewhere classified, subsequent encounter Plan Follow-up Appointments: Return appointment in 1 month. Dressing Change Frequency: Wound #1 Sacrum: Change Dressing every other day. Skin Barriers/Peri-Wound Care: Wound #1 Sacrum: Skin Prep Wound Cleansing: Wound #1  Sacrum: Clean wound with Wound Cleanser - or normal saline Primary Wound Dressing: Wound #1 Sacrum: Silver Collagen - moistened saline 4x4 backing Wound #3 Gluteal fold: Silver Collagen - moistened saline 4x4 backing Secondary Dressing: Wound #1 Sacrum: Foam Border - or ABD Off-Loading: Turn and reposition every 2 hours 1. We have change the primary dressing here from [facility] to moistened silver collagen which they should be able to fit in the small wound area. 2. Aggressive offloading of the area is important and the patient is aware of this. I note that she is recently apparently fractured her left ankle 3. The wounded area is still too small for properly placing Hydrofera Blue Electronic Signature(s) Signed: 01/27/2020 6:30:39 AM By: Linton Ham MD Entered By: Linton Ham on 01/25/2020 10:36:39 -------------------------------------------------------------------------------- SuperBill Details Patient Name: Date of Service: [name] Barnett, [name] Barnett 01/25/2020 Medical Record Number: [id number] Patient Account Number: [id number] Date of Birth/Sex: Treating RN: 05/29/1980 (39 y.o. F) Dwiggins, [facility] Primary Care Provider: SITA FA LWA LLA, SHA RMIN Other Clinician: Referring Provider: Treating Provider/Extender: Linton Ham SITA FA LWA LLA, SHA RMIN Weeks in Treatment: 32 Diagnosis Coding ICD-10 Codes Code Description L89.154 Pressure ulcer of sacral region, stage 4 T81.31XD Disruption of external operation (surgical) wound, not elsewhere classified, subsequent encounter M72.6 Necrotizing fasciitis Facility Procedures CPT4 Code: [id number] Description: 00867 - WOUND CARE VISIT-LEV 3 EST PT Modifier: Quantity: 1 Physician Procedures : CPT4 Code Description Modifier [id number] - WC PHYS LEVEL 3 - EST PT ICD-10 Diagnosis Description L89.154 Pressure ulcer of sacral region, stage 4 Quantity: 1 Electronic Signature(s) Signed: 01/27/2020 6:30:39 AM By: Linton Ham MD Entered By: Linton Ham on 01/25/2020 10:36:55

## 2020-01-28 ENCOUNTER — Ambulatory Visit: Payer: Self-pay

## 2020-01-28 ENCOUNTER — Ambulatory Visit (INDEPENDENT_AMBULATORY_CARE_PROVIDER_SITE_OTHER): Payer: Medicare Other | Admitting: Physician Assistant

## 2020-01-28 ENCOUNTER — Encounter: Payer: Self-pay | Admitting: Physician Assistant

## 2020-01-28 DIAGNOSIS — S82891A Other fracture of right lower leg, initial encounter for closed fracture: Secondary | ICD-10-CM

## 2020-01-28 NOTE — Progress Notes (Signed)
Office Visit Note   Patient: [name] Barnett           Date of Birth: 04/28/1981           MRN: [id number] Visit Date: 01/28/2020              Requested by: [name] Handler, DO [address] [place],  [place] [address] PCP: [name] Handler, DO   Assessment & Plan: Visit Diagnoses:  1. Closed fracture of right ankle, initial encounter     Plan: Discussed with patient that she is began to heal the lateral malleolus fracture and even though the talus is subluxed would not recommend surgery.  Not recommend surgery on the basis of the fact the patient's multiple comorbidities which include but not limited to recent sepsis, chronic lymphedema right leg, renal failure on dialysis sacral decubitus ulcer stage III and type 2 diabetes.  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  She may require fusion of the ankle in the future.  She is given a compression sock for the right leg to help with her chronic edema.  She will continue the cam walker boot on the right leg touchdown weightbearing for transfers only.  Elevation of the leg and wiggling toes and encouraged.  We will see her back in 1 month for repeat radiographs.  Questions were encouraged and answered.  Follow-Up Instructions: Return for Radiographs.   Orders:  Orders Placed This Encounter  Procedures  . XR Ankle Complete Right   No orders of the defined types were placed in this encounter.     Procedures: No procedures performed   Clinical Data: No additional findings.   Subjective: Chief Complaint  Patient presents with  . Right Ankle - Fracture    HPI [name] is a 39 year old female who comes in today for right ankle fracture that occurred on 9-21.  She is seen in the ER at [facility].  Her treatment has been delayed due to sepsis which she was hospitalized for.  She also has multiple comorbidities.  She states that she was walking to her wheelchair  when her right leg gave way and she fell and twisted her ankle.  She resides at [facility] where she is undergoing rehab.  She states she has been in rehab since sometime last year after being resuscitated due to complications with sepsis due to a sacral ulcer.  Review of Systems Denies any fevers or chills.  Objective: Vital Signs: There were no vitals taken for this visit.  Physical Exam Constitutional:      Appearance: She is not ill-appearing or diaphoretic.  Neurological:     Mental Status: She is alert and oriented to person, place, and time.  Psychiatric:        Mood and Affect: Mood normal.     Ortho Exam Right calf supple nontender.  Edema of the right lower leg present.  Unable to palpate dorsal pedal pulse able to Doppler dorsal pedal pulse which is normal.  Right ankle in no tenting of the skin or skin breakdown.  Sensation grossly intact throughout the foot. Specialty Comments:  No specialty comments available.  Imaging: XR Ankle Complete Right  Result Date: 01/28/2020 Right ankle 3 views: Lateral malleolus fracture with early signs of consolidation.  The fibula is shortened and with valgus deformity of the distal one fourth of the fibula.  AP oblique view show the talus well located within the ankle mortise.  Lateral  view shows slight anterior subluxation of the talus.    PMFS History: Patient Active Problem List   Diagnosis Date Noted  . Sepsis secondary to UTI ([place]) 01/13/2020  . Acute respiratory failure with hypoxia ([place]) 01/13/2020  . Right leg swelling 01/13/2020  . Sacral decubitus ulcer 01/13/2020  . Anemia due to end stage renal disease ([place]) 01/13/2020  . Essential hypertension 01/13/2020  . Hyperlipidemia 01/13/2020  . Type 2 diabetes mellitus ([place]) 01/13/2020   Past Medical History:  Diagnosis Date  . Diabetes mellitus without complication ([place])   . Hyperlipidemia   . Hypertension   . Renal disorder   . Stroke ([place])   . Vitamin D deficiency      History reviewed. No pertinent family history.  Past Surgical History:  Procedure Laterality Date  . cardiac stents    . COLOSTOMY    . wound on buttocks     Social History   Occupational History  . Not on file  Tobacco Use  . Smoking status: Never Smoker  . Smokeless tobacco: Never Used  Substance and Sexual Activity  . Alcohol use: Never  . Drug use: Never  . Sexual activity: Not on file

## 2020-01-28 NOTE — Progress Notes (Signed)
[name] Barnett, [name] Barnett ([id number]) Visit Report for 01/25/2020 Arrival Information Details Patient Name: Date of Service: [name] Barnett, [place] 01/25/2020 9:30 [place] Record Number: [id number] Patient Account Number: [id number] Date of Birth/Sex: Treating RN: 12-13-1980 (39 y.o. F) Dwiggins, [facility] Primary Care Alanson Hausmann: SITA FA LWA LLA, SHA RMIN Other Clinician: Referring Tiyana Galla: Treating Iyonnah Ferrante/Extender: Linton Ham SITA FA LWA LLA, SHA RMIN Weeks in Treatment: 12 Visit Information History Since Last Visit Added or deleted any medications: No Patient Arrived: Wheel Chair Any new allergies or adverse reactions: No Arrival Time: 09:43 Had a fall or experienced change in No Accompanied By: self activities of daily living that may affect Transfer Assistance: None risk of falls: Patient Identification Verified: Yes Signs or symptoms of abuse/neglect since last visito No Secondary Verification Process Completed: Yes Hospitalized since last visit: No Patient Requires Transmission-Based Precautions: No Implantable device outside of the clinic excluding No Patient Has Alerts: No cellular tissue based products placed in the center since last visit: Has Dressing in Place as Prescribed: Yes Pain Present Now: Yes Electronic Signature(s) Signed: 01/25/2020 3:01:17 PM By: Sandre Kitty Entered By: Sandre Kitty on 01/25/2020 09:44:07 -------------------------------------------------------------------------------- Clinic Level of Care Assessment Details Patient Name: Date of Service: [name] Barnett, [place] 01/25/2020 9:30 [place] Record Number: [id number] Patient Account Number: [id number] Date of Birth/Sex: Treating RN: Sep 07, 1980 (39 y.o. F) Dwiggins, [name] Barnett [facility] Primary Care Janaysia Mcleroy: SITA FA LWA LLA, SHA RMIN Other Clinician: Referring [name] Barnett: Treating [name] Barnett/Extender: Linton Ham SITA FA LWA LLA, SHA RMIN Weeks in Treatment: 32 Clinic Level of Care  Assessment Items TOOL 4 Quantity Score X- 1 0 Use when only an EandM is performed on FOLLOW-UP visit ASSESSMENTS - Nursing Assessment / Reassessment X- 1 10 Reassessment of Co-morbidities (includes updates in patient status) X- 1 5 Reassessment of Adherence to Treatment Plan ASSESSMENTS - Wound and Skin A ssessment / Reassessment [] - 0 Simple Wound Assessment / Reassessment - one wound X- 2 5 Complex Wound Assessment / Reassessment - multiple wounds [] - 0 Dermatologic / Skin Assessment (not related to wound area) ASSESSMENTS - Focused Assessment [] - 0 Circumferential Edema Measurements - multi extremities [] - 0 Nutritional Assessment / Counseling / Intervention [] - 0 Lower Extremity Assessment (monofilament, tuning fork, pulses) [] - 0 Peripheral Arterial Disease Assessment (using hand held doppler) ASSESSMENTS - Ostomy and/or Continence Assessment and Care [] - 0 Incontinence Assessment and Management [] - 0 Ostomy Care Assessment and Management (repouching, etc.) PROCESS - Coordination of Care X - Simple Patient / Family Education for ongoing care 1 15 [] - 0 Complex (extensive) Patient / Family Education for ongoing care X- 1 10 Staff obtains Programmer, systems, Records, T Results / Process Orders est X- 1 10 Staff telephones HHA, Nursing Homes / Clarify orders / etc [] - 0 Routine Transfer to another Facility (non-emergent condition) [] - 0 Routine Hospital Admission (non-emergent condition) [] - 0 New Admissions / Biomedical engineer / Ordering NPWT Apligraf, etc. , [] - 0 Emergency Hospital Admission (emergent condition) X- 1 10 Simple Discharge Coordination [] - 0 Complex (extensive) Discharge Coordination PROCESS - Special Needs [] - 0 Pediatric / Minor Patient Management [] - 0 Isolation Patient Management [] - 0 Hearing / Language / Visual special needs [] - 0 Assessment of Community assistance (transportation, D/C planning, etc.) [] -  0 Additional assistance / Altered mentation [] - 0 Support Surface(s) Assessment (bed, cushion, seat, etc.) INTERVENTIONS - Wound Cleansing / Measurement [] - 0  Simple Wound Cleansing - one wound X- 2 5 Complex Wound Cleansing - multiple wounds X- 1 5 Wound Imaging (photographs - any number of wounds) [] - 0 Wound Tracing (instead of photographs) X- 1 5 Simple Wound Measurement - one wound [] - 0 Complex Wound Measurement - multiple wounds INTERVENTIONS - Wound Dressings X - Small Wound Dressing one or multiple wounds 1 10 [] - 0 Medium Wound Dressing one or multiple wounds [] - 0 Large Wound Dressing one or multiple wounds X- 1 5 Application of Medications - topical [] - 0 Application of Medications - injection INTERVENTIONS - Miscellaneous [] - 0 External ear exam [] - 0 Specimen Collection (cultures, biopsies, blood, body fluids, etc.) [] - 0 Specimen(s) / Culture(s) sent or taken to Lab for analysis [] - 0 Patient Transfer (multiple staff / Hoyer Lift / Similar devices) [] - 0 Simple Staple / Suture removal (25 or less) [] - 0 Complex Staple / Suture removal (26 or more) [] - 0 Hypo / Hyperglycemic Management (close monitor of Blood Glucose) [] - 0 Ankle / Brachial Index (ABI) - do not check if billed separately X- 1 5 Vital Signs Has the patient been seen at the hospital within the last three years: Yes Total Score: 110 Level Of Care: New/Established - Level 3 Electronic Signature(s) Signed: 01/25/2020 5:26:06 PM By: Dwiggins, Shannon Entered By: Dwiggins, Shannon on 01/25/2020 10:09:39 -------------------------------------------------------------------------------- Encounter Discharge Information Details Patient Name: Date of Service: [name] Barnett, Abrie 01/25/2020 9:30 A M Medical Record Number: [id number] Patient Account Number: [id number] Date of Birth/Sex: Treating RN: 12/21/1980 (38 y.o. F) Barnett, [name] Primary Care Provider: SITA FA LWA LLA,  SHA RMIN Other Clinician: Referring Provider: Treating Provider/Extender: Robson, Michael SITA FA LWA LLA, SHA RMIN Weeks in Treatment: 32 Encounter Discharge Information Items Discharge Condition: Stable Ambulatory Status: Wheelchair Discharge Destination: Home Transportation: Private Auto Accompanied By: caregiver Schedule Follow-up Appointment: Yes Clinical Summary of Care: Patient Declined Electronic Signature(s) Signed: 01/28/2020 5:31:38 PM By: Barnett, Shatara RN, BSN Entered By: Barnett, [name] on 01/25/2020 11:40:27 -------------------------------------------------------------------------------- Lower Extremity Assessment Details Patient Name: Date of Service: [name] Barnett, [place] 01/25/2020 9:30 A M Medical Record Number: [id number] Patient Account Number: [id number] Date of Birth/Sex: Treating RN: 08/20/1980 (38 y.o. F) Dwiggins, Shannon Primary Care Provider: SITA FA LWA LLA, SHA RMIN Other Clinician: Referring Provider: Treating Provider/Extender: Robson, Michael SITA FA LWA LLA, SHA RMIN Weeks in Treatment: 32 Electronic Signature(s) Signed: 01/25/2020 5:26:06 PM By: Dwiggins, Shannon Entered By: Dwiggins, Shannon on 01/25/2020 10:04:17 -------------------------------------------------------------------------------- Multi Wound Chart Details Patient Name: Date of Service: [name] Barnett, Renu 01/25/2020 9:30 A M Medical Record Number: [id number] Patient Account Number: [id number] Date of Birth/Sex: Treating RN: 03/10/1981 (38 y.o. F) Dwiggins, Shannon Primary Care Provider: SITA FA LWA LLA, SHA RMIN Other Clinician: Referring Provider: Treating Provider/Extender: Robson, Michael SITA FA LWA LLA, SHA RMIN Weeks in Treatment: 32 Vital Signs Height(in): 65 Pulse(bpm): 82 Weight(lbs): 130 Blood Pressure(mmHg): 135/85 Body Mass Index(BMI): 22 Temperature(°F): 97.9 Respiratory Rate(breaths/min): 18 Photos: [1:No Photos Sacrum] [3:No Photos Gluteal fold]  [N/A:N/A N/A] Wound Location: [1:Pressure Injury] [3:Gradually Appeared] [N/A:N/A] Wounding Event: [1:Pressure Ulcer] [3:Pressure Ulcer] [N/A:N/A] Primary Etiology: [1:Necrotizing Infection] [3:N/A] [N/A:N/A] Secondary Etiology: [1:Cataracts, Glaucoma, Anemia,] [3:Cataracts, Glaucoma, Anemia,] [N/A:N/A] Comorbid History: [1:Congestive Heart Failure, Coronary Congestive Heart Failure, Coronary Artery Disease, Hypertension, Type II Artery Disease, Hypertension, Type II Diabetes, End Stage Renal Disease, Diabetes, End Stage Renal Disease, Neuropathy,  Confinement Anxiety 01/11/2019] [3:Neuropathy, Confinement Anxiety 11/26/2019] [N/A:N/A] Date   Acquired: [1:32] [3:5] [N/A:N/A] Weeks of Treatment: [1:Open] [3:Open] [N/A:N/A] Wound Status: [1:Yes] [3:No] [N/A:N/A] Clustered Wound: [1:0.3x0.5x0.4] [3:1.1x0.6x0.1] [N/A:N/A] Measurements L x W x D (cm) [1:0.118] [3:0.518] [N/A:N/A] A (cm) : rea [1:0.047] [3:0.052] [N/A:N/A] Volume (cm) : [1:99.70%] [3:14.40%] [N/A:N/A] % Reduction in A rea: [1:99.80%] [3:57.00%] [N/A:N/A] % Reduction in Volume: [1:Category/Stage IV] [3:Category/Stage II] [N/A:N/A] Classification: [1:Medium] [3:Medium] [N/A:N/A] Exudate A mount: [1:Serosanguineous] [3:Serosanguineous] [N/A:N/A] Exudate Type: [1:red, brown] [3:red, brown] [N/A:N/A] Exudate Color: [1:Thickened] [3:Flat and Intact] [N/A:N/A] Wound Margin: [1:Large (67-100%)] [3:Large (67-100%)] [N/A:N/A] Granulation A mount: [1:Pink] [3:Pink] [N/A:N/A] Granulation Quality: [1:None Present (0%)] [3:None Present (0%)] [N/A:N/A] Necrotic A mount: [1:Fat Layer (Subcutaneous Tissue): Yes Fat Layer (Subcutaneous Tissue): Yes N/A] Exposed Structures: [1:Fascia: No Tendon: No Muscle: No Joint: No Bone: No Medium (34-66%)] [3:Fascia: No Tendon: No Muscle: No Joint: No Bone: No Small (1-33%)] [N/A:N/A] Treatment Notes Electronic Signature(s) Signed: 01/25/2020 5:26:06 PM By: Kela Millin Signed: 01/27/2020 6:30:39 AM By:  Linton Ham MD Entered By: Linton Ham on 01/25/2020 10:32:18 -------------------------------------------------------------------------------- Multi-Disciplinary Care Plan Details Patient Name: Date of Service: [name] Barnett, [place] 01/25/2020 9:30 A M Medical Record Number: [id number] Patient Account Number: [id number] Date of Birth/Sex: Treating RN: May 01, 1981 (39 y.o. F) Dwiggins, [facility] Primary Care Ivan Lacher: SITA FA LWA LLA, SHA RMIN Other Clinician: Referring Kharson Rasmusson: Treating Rishav Rockefeller/Extender: Linton Ham SITA FA LWA LLA, SHA RMIN Weeks in Treatment: 32 Active Inactive Wound/Skin Impairment Nursing Diagnoses: Impaired tissue integrity Goals: Patient/caregiver will verbalize understanding of skin care regimen Date Initiated: 08/27/2019 Target Resolution Date: 02/22/2020 Goal Status: Active Ulcer/skin breakdown will have a volume reduction of 50% by week 8 Date Initiated: 06/15/2019 Date Inactivated: 08/27/2019 Target Resolution Date: 08/03/2019 Goal Status: Met Interventions: Assess patient/caregiver ability to obtain necessary supplies Assess patient/caregiver ability to perform ulcer/skin care regimen upon admission and as needed Provide education on ulcer and skin care Notes: Electronic Signature(s) Signed: 01/25/2020 5:26:06 PM By: Kela Millin Entered By: Kela Millin on 01/25/2020 10:08:48 -------------------------------------------------------------------------------- Pain Assessment Details Patient Name: Date of Service: [name] Barnett, REGINO 01/25/2020 9:30 A M Medical Record Number: [id number] Patient Account Number: [id number] Date of Birth/Sex: Treating RN: [birth year] (39 y.o. F) Dwiggins, [facility] Primary Care Zanyla Klebba: SITA FA LWA LLA, SHA RMIN Other Clinician: Referring Gamaliel Charney: Treating Samaiya Awadallah/Extender: Linton Ham SITA FA LWA LLA, SHA RMIN Weeks in Treatment: 32 Active Problems Location of Pain Severity and  Description of Pain Patient Has Paino Yes Site Locations Rate the pain. Current Pain Level: 7 Pain Management and Medication Current Pain Management: Electronic Signature(s) Signed: 01/25/2020 3:01:17 PM By: Sandre Kitty Signed: 01/25/2020 5:26:06 PM By: Kela Millin Entered By: Sandre Kitty on 01/25/2020 09:44:32 -------------------------------------------------------------------------------- Patient/Caregiver Education Details Patient Name: Date of Service: [name] DA Stefano Gaul, Allayah 9/24/2021andnbsp9:30 [place] Record Number: [id number] Patient Account Number: [id number] Date of Birth/Gender: Treating RN: 02-Mar-1981 (39 y.o. F) Dwiggins, [facility] [place] Primary Care Physician: SITA FA LWA LLA, SHA RMIN Other Clinician: Referring Physician: Treating Physician/Extender: Linton Ham SITA FA LWA LLA, SHA RMIN Weeks in Treatment: 32 Education Assessment Education Provided To: Patient Education Topics Provided Wound/Skin Impairment: Handouts: Caring for Your Ulcer Methods: Explain/Verbal Responses: State content correctly Electronic Signature(s) Signed: 01/25/2020 5:26:06 PM By: Kela Millin Entered By: Kela Millin on 01/25/2020 10:09:00 -------------------------------------------------------------------------------- Wound Assessment Details Patient Name: Date of Service: [name] Barnett, KOLASINSKI 01/25/2020 9:30 A M Medical Record Number: [id number] Patient Account Number: [id number] Date of Birth/Sex: Treating RN: 1980/09/08 (39 y.o. F) Dwiggins, [facility] Primary Care Laritza Vokes: SITA FA LWA LLA, SHA RMIN Other Clinician:  Referring Provider: Treating Provider/Extender: Robson, Michael SITA FA LWA LLA, SHA RMIN Weeks in Treatment: 32 Wound Status Wound Number: 1 Primary Pressure Ulcer Etiology: Wound Location: Sacrum Secondary Necrotizing Infection Wounding Event: Pressure Injury Etiology: Date Acquired: 01/11/2019 Wound Open Weeks Of Treatment:  32 Status: Clustered Wound: Yes Comorbid Cataracts, Glaucoma, Anemia, Congestive Heart Failure, Coronary History: Artery Disease, Hypertension, Type II Diabetes, End Stage Renal Disease, Neuropathy, Confinement Anxiety Wound Measurements Length: (cm) 0.3 Width: (cm) 0.5 Depth: (cm) 0.4 Area: (cm) 0.118 Volume: (cm) 0.047 % Reduction in Area: 99.7% % Reduction in Volume: 99.8% Epithelialization: Medium (34-66%) Tunneling: No Undermining: No Wound Description Classification: Category/Stage IV Wound Margin: Thickened Exudate Amount: Medium Exudate Type: Serosanguineous Exudate Color: red, brown Wound Bed Granulation Amount: Large (67-100%) Granulation Quality: Pink Necrotic Amount: None Present (0%) Foul Odor After Cleansing: No Slough/Fibrino Yes Exposed Structure Fascia Exposed: No Fat Layer (Subcutaneous Tissue) Exposed: Yes Tendon Exposed: No Muscle Exposed: No Joint Exposed: No Bone Exposed: No Treatment Notes Wound #1 (Sacrum) 1. Cleanse With Wound Cleanser 2. Periwound Care Skin Prep 3. Primary Dressing Applied Hydrofera Blue 4. Secondary Dressing Foam Border Dressing Electronic Signature(s) Signed: 01/25/2020 5:26:06 PM By: Dwiggins, Shannon Signed: 01/28/2020 5:31:38 PM By: Barnett, Shatara RN, BSN Entered By: Barnett, [name] on 01/25/2020 09:56:07 -------------------------------------------------------------------------------- Wound Assessment Details Patient Name: Date of Service: [name] Barnett, Lashanda 01/25/2020 9:30 A M Medical Record Number: [id number] Patient Account Number: [id number] Date of Birth/Sex: Treating RN: 11/09/1980 (38 y.o. F) Dwiggins, Shannon Primary Care Provider: SITA FA LWA LLA, SHA RMIN Other Clinician: Referring Provider: Treating Provider/Extender: Robson, Michael SITA FA LWA LLA, SHA RMIN Weeks in Treatment: 32 Wound Status Wound Number: 3 Primary Pressure Ulcer Etiology: Wound Location: Gluteal fold Wound  Open Wounding Event: Gradually Appeared Status: Date Acquired: 11/26/2019 Comorbid Cataracts, Glaucoma, Anemia, Congestive Heart Failure, Coronary Weeks Of Treatment: 5 History: Artery Disease, Hypertension, Type II Diabetes, End Stage Renal Clustered Wound: No Disease, Neuropathy, Confinement Anxiety Wound Measurements Length: (cm) 1.1 Width: (cm) 0.6 Depth: (cm) 0.1 Area: (cm) 0.518 Volume: (cm) 0.052 % Reduction in Area: 14.4% % Reduction in Volume: 57% Epithelialization: Small (1-33%) Tunneling: No Undermining: No Wound Description Classification: Category/Stage II Wound Margin: Flat and Intact Exudate Amount: Medium Exudate Type: Serosanguineous Exudate Color: red, brown Foul Odor After Cleansing: No Slough/Fibrino Yes Wound Bed Granulation Amount: Large (67-100%) Exposed Structure Granulation Quality: Pink Fascia Exposed: No Necrotic Amount: None Present (0%) Fat Layer (Subcutaneous Tissue) Exposed: Yes Tendon Exposed: No Muscle Exposed: No Joint Exposed: No Bone Exposed: No Treatment Notes Wound #3 (Gluteal fold) 1. Cleanse With Wound Cleanser 2. Periwound Care Skin Prep 3. Primary Dressing Applied Hydrofera Blue 4. Secondary Dressing Foam Border Dressing Electronic Signature(s) Signed: 01/25/2020 5:26:06 PM By: Dwiggins, Shannon Signed: 01/28/2020 5:31:38 PM By: Barnett, Shatara RN, BSN Entered By: Barnett, [name] on 01/25/2020 09:56:23 -------------------------------------------------------------------------------- Vitals Details Patient Name: Date of Service: [name] Barnett, [place] 01/25/2020 9:30 A M Medical Record Number: [id number] Patient Account Number: [id number] Date of Birth/Sex: Treating RN: 06/14/1980 (38 y.o. F) Dwiggins, Shannon Primary Care Provider: SITA FA LWA LLA, SHA RMIN Other Clinician: Referring Provider: Treating Provider/Extender: Robson, Michael SITA FA LWA LLA, SHA RMIN Weeks in Treatment: 32 Vital Signs Time Taken:  09:44 Temperature (°F): 97.9 Height (in): 65 Pulse (bpm): 82 Weight (lbs): 130 Respiratory Rate (breaths/min): 18 Body Mass Index (BMI): 21.6 Blood Pressure (mmHg): 135/85 Reference Range: 80 - 120 mg / dl Electronic Signature(s) Signed: 01/25/2020 3:01:17 PM By: Dawkins, Destiny Entered By: Dawkins,   Destiny on 01/25/2020 09:44:24

## 2020-02-22 ENCOUNTER — Other Ambulatory Visit: Payer: Self-pay

## 2020-02-22 ENCOUNTER — Encounter (HOSPITAL_BASED_OUTPATIENT_CLINIC_OR_DEPARTMENT_OTHER): Payer: Medicare Other | Attending: Internal Medicine | Admitting: Internal Medicine

## 2020-02-22 DIAGNOSIS — L89154 Pressure ulcer of sacral region, stage 4: Secondary | ICD-10-CM | POA: Insufficient documentation

## 2020-02-22 NOTE — Progress Notes (Signed)
[name] Barnett, [name] Barnett ([id number]) Visit Report for 02/22/2020 HPI Details Patient Name: Date of Service: [name] Barnett, [name] Barnett 02/22/2020 10:30 A M Medical Record Number: [id number] Patient Account Number: [id number] Date of Birth/Sex: Treating RN: [birth year] (39 y.o. [name] Primary Care Provider: SITA FA LWA LLA, SHA RMIN Other Clinician: Referring Provider: Treating Provider/Extender: Linton Ham SITA FA LWA LLA, SHA RMIN Weeks in Treatment: 36 History of Present Illness HPI Description: Admission 06/15/2019 This is a 39 year old woman who comes to [place] from [facility] skilled facility in [place]. I am able to follow a lot of her history on care everywhere in epic. The patient is a type II diabetic and has been on dialysis. 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 I am not exactly sure of the nature of the stroke at this present time. She has made a decent recovery and is now up walking with a walker. In August 2020 she was admitted to [facility] in [place] with L4-L5 discitis. Culture and sensitivity showed Klebsiella. She was discharged on 8 weeks of ceftazidime. In mid September she is readmitted to the hospital with a pressure ulcer on her lower sacrum and necrotizing fasciitis. She required an extensive surgical debridement. CT scan of the pelvis at 1 point showed communication with the rectum. A colostomy was recommended and placed. Culture of this area showed Klebsiella and yeast. She was discharged on an extended course of meropenem and Diflucan. [facility] [place] We do not have any of these records. I think she was discharged to another nursing home but is come to [facility] skilled facility in [facility]. She was sent down here for review of the remaining wound. By review of the pictures that the patient had from  September this is made considerable improvement. She tells me that the wound VAC was discontinued at the end of December 2020. Currently the facility is using a form of silver alginate to the wound surface. Past medical history type 2 diabetes with chronic renal failure on dialysis Tuesday Thursday and Saturday. CVA with left hemiparesis in June 2020 but she appears to be making a good recovery. Chronic diastolic heart failure, hypertension 2/19; patient's wound area slightly smaller. The area that was towards her anus has closed over. We are using Santyl covered with Hydrofera Blue. We have not been able to get her lab work apparently her veins are very difficult for phlebotomy. They are trying to get the blood work done where she dialyzes in [facility] but that requires some logistical issues. 2/26; not much change in the wound. Still a nonviable surface we have been using Santyl and Hydrofera Blue. I changed her to [facility] only. She tells me she has rigorously offloading this area she also complains of some pain 3/5; still no change here. I switch to pure Hydrofera Blue. She still complains of a lot of pain although is a big wound it seems out of proportion to what I might expect. This was initially a surgical wound secondary to necrotizing wound infection. She is on dialysis. Other than that she thinks she is offloading this fairly rigorously. She has not been systemically unwell 3/26; I switched to Iodoflex 3 weeks ago. This really seems to have helped, much better surface and slight improvement in the surface area. 4/26; the patient is still on Iodoflex she missed an appointment 2 weeks  ago. Doing slightly smaller. She is on dialysis 5/10; using Hydrofera Blue. The major improvement here is the complete lack of depth. She has islands of epithelialization 09/24/19-Patient comes in at 2 weeks, dimensions about the same, there is an [place] of epithelium at margin of wound, no slough  noted 10/15/2019 upon evaluation today patient appears to be doing well with regard to her wound. She has been tolerating the dressing changes in the sacral region without complication and at this point she seems to be healing quite nicely. I am extremely pleased with how things are progressing and the epithelial tissue seems to be spreading from the central portion as well which is also excellent. 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 We have been using Hydrofera Blue. The smaller area which has depth at 0.7 cm there is a question about whether they have been packing anything into this or just flaring Hydrofera Blue over the top 9/23; its been more than 2 months since this patient was here. She still has a small area distally which is the remanent of her stage IV wound. They have been using Hydrofera Blue. She also has an area on the left gluteal 10/22; 1 month follow-up. I think the original sacral wound is closed however the patient has 2 areas distally 1 over the coccyx a small pinpoint area and the other into the gluteal cleft. Neither 1 of these looks particularly infected. I thought they might be connected but they were not. Electronic Signature(s) Signed: 02/22/2020 4:52:36 PM By: Linton Ham MD Entered By: Linton Ham on 02/22/2020 16:35:28 -------------------------------------------------------------------------------- Physical Exam Details Patient Name: Date of Service: [name] Barnett, [name] Barnett 02/22/2020 10:30 A M Medical Record Number: [id number] Patient Account Number: [id number] Date of Birth/Sex: Treating RN: [birth year] (39 y.o. F) [name] Primary Care Provider: SITA FA LWA LLA, SHA RMIN Other Clinician: Referring Provider: Treating Provider/Extender: Linton Ham SITA FA LWA LLA, SHA RMIN Weeks in Treatment: 36 Constitutional Sitting or standing Blood Pressure is within target range for  patient.. Pulse regular and within target range for patient.Marland Kitchen Respirations regular, non-labored and within target range.. Temperature is normal and within the target range for the patient.Marland Kitchen Appears in no distress. Notes Wound exam; I think everything that was there initially closed however she now has 2 areas more distally than the original wounds. 1 over the tip of the coccyx the other just beyond which has a larger surface area. The left gluteal area is closed Electronic Signature(s) Signed: 02/22/2020 4:52:36 PM By: Linton Ham MD Entered By: Linton Ham on 02/22/2020 16:36:18 -------------------------------------------------------------------------------- Physician Orders Details Patient Name: Date of Service: [name] DA CATLYN, SHIPTON 02/22/2020 10:30 A M Medical Record Number: [id number] Patient Account Number: [id number] Date of Birth/Sex: Treating RN: [birth year] (39 y.o. F) [name] Primary Care Provider: SITA FA LWA LLA, SHA RMIN Other Clinician: Referring Provider: Treating Provider/Extender: Linton Ham SITA FA LWA LLA, SHA RMIN Weeks in Treatment: 24 Verbal / Phone Orders: No Diagnosis Coding ICD-10 Coding Code Description L89.154 Pressure ulcer of sacral region, stage 4 T81.31XD Disruption of external operation (surgical) wound, not elsewhere classified, subsequent encounter Follow-up Appointments Return appointment in 1 month. - please call clinic to cancel if pt moves to [place] Frequency Wound #3 Gluteal fold Change dressing every day. Wound #4 Coccyx Change dressing every day. Wound Cleansing Wound #3 Gluteal fold Clean wound with Wound Cleanser Wound #4 Coccyx Clean wound with Wound Cleanser  Primary Wound Dressing Wound #3 Gluteal fold Calcium Alginate with Silver Wound #4 Coccyx Calcium Alginate with Silver Secondary Dressing Wound #3 Gluteal fold Foam Border - or gauze and tape Wound #4 Coccyx Foam Border - or  gauze and tape Off-Loading Turn and reposition every 2 hours Electronic Signature(s) Signed: 02/22/2020 4:52:36 PM By: Linton Ham MD Signed: 02/22/2020 5:21:31 PM By: [name] RN, BSN Entered By: [name] on 02/22/2020 12:04:01 -------------------------------------------------------------------------------- Problem List Details Patient Name: Date of Service: [name] Barnett, [name] Barnett 02/22/2020 10:30 A M Medical Record Number: [id number] Patient Account Number: [id number] Date of Birth/Sex: Treating RN: 06/01/1980 (39 y.o. F) [name] Primary Care Provider: SITA FA LWA LLA, SHA RMIN Other Clinician: Referring Provider: Treating Provider/Extender: Linton Ham SITA FA LWA LLA, SHA RMIN Weeks in Treatment: 36 Active Problems ICD-10 Encounter Code Description Active Date MDM Diagnosis L89.154 Pressure ulcer of sacral region, stage 4 06/15/2019 No Yes T81.31XD Disruption of external operation (surgical) wound, not elsewhere classified, 06/15/2019 No Yes subsequent encounter Inactive Problems ICD-10 Code Description Active Date Inactive Date L89.152 Pressure ulcer of sacral region, stage 2 06/15/2019 06/15/2019 M72.6 Necrotizing fasciitis 06/15/2019 06/15/2019 Resolved Problems Electronic Signature(s) Signed: 02/22/2020 4:52:36 PM By: Linton Ham MD Entered By: Linton Ham on 02/22/2020 16:33:13 -------------------------------------------------------------------------------- Progress Note Details Patient Name: Date of Service: [name] Barnett, [name] Barnett 02/22/2020 10:30 A M Medical Record Number: [id number] Patient Account Number: [id number] Date of Birth/Sex: Treating RN: October 30, 1980 (39 y.o. [name] Primary Care Provider: SITA FA LWA LLA, SHA RMIN Other Clinician: Referring Provider: Treating Provider/Extender: Linton Ham SITA FA LWA LLA, SHA RMIN Weeks in Treatment: 36 Subjective History of Present Illness  (HPI) Admission 06/15/2019 This is a 39 year old woman who comes to [place] from [facility] skilled facility in [facility]. I am able to follow a lot of her history on care everywhere in epic. The patient is a type II diabetic and has been on dialysis. 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 I am not exactly sure of the nature of the stroke at this present time. She has made a decent recovery and is now up walking with a walker. In August 2020 she was admitted to [facility] in [place] with L4-L5 discitis. Culture and sensitivity showed Klebsiella. She was discharged on 8 weeks of ceftazidime. In mid September she is readmitted to the hospital with a pressure ulcer on her lower sacrum and necrotizing fasciitis. She required an extensive surgical debridement. CT scan of the pelvis at 1 point showed communication with the rectum. A colostomy was recommended and placed. Culture of this area showed Klebsiella and yeast. She was discharged on an extended course of meropenem and Diflucan. [facility] [place] We do not have any of these records. I think she was discharged to another nursing home but is come to [facility] skilled facility in [facility]. She was sent down here for review of the remaining wound. By review of the pictures that the patient had from September this is made considerable improvement. She tells me that the wound VAC was discontinued at the end of December 2020. Currently the facility is using a form of silver alginate to the wound surface. Past medical history type 2 diabetes with chronic renal failure on dialysis Tuesday Thursday and Saturday. CVA with left hemiparesis in June 2020 but she appears to be making a good recovery. Chronic diastolic  heart failure, hypertension 2/19; patient's wound area slightly smaller. The area that was towards  her anus has closed over. We are using Santyl covered with Hydrofera Blue. We have not been able to get her lab work apparently her veins are very difficult for phlebotomy. They are trying to get the blood work done where she dialyzes in [facility] but that requires some logistical issues. 2/26; not much change in the wound. Still a nonviable surface we have been using Santyl and Hydrofera Blue. I changed her to [facility] only. She tells me she has rigorously offloading this area she also complains of some pain 3/5; still no change here. I switch to pure Hydrofera Blue. She still complains of a lot of pain although is a big wound it seems out of proportion to what I might expect. This was initially a surgical wound secondary to necrotizing wound infection. She is on dialysis. Other than that she thinks she is offloading this fairly rigorously. She has not been systemically unwell 3/26; I switched to Iodoflex 3 weeks ago. This really seems to have helped, much better surface and slight improvement in the surface area. 4/26; the patient is still on Iodoflex she missed an appointment 2 weeks ago. Doing slightly smaller. She is on dialysis 5/10; using Hydrofera Blue. The major improvement here is the complete lack of depth. She has islands of epithelialization 09/24/19-Patient comes in at 2 weeks, dimensions about the same, there is an [place] of epithelium at margin of wound, no slough noted 10/15/2019 upon evaluation today patient appears to be doing well with regard to her wound. She has been tolerating the dressing changes in the sacral region without complication and at this point she seems to be healing quite nicely. I am extremely pleased with how things are progressing and the epithelial tissue seems to be spreading from the central portion as well which is also excellent. 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 We have  been using Hydrofera Blue. The smaller area which has depth at 0.7 cm there is a question about whether they have been packing anything into this or just flaring Hydrofera Blue over the top 9/23; its been more than 2 months since this patient was here. She still has a small area distally which is the remanent of her stage IV wound. They have been using Hydrofera Blue. She also has an area on the left gluteal 10/22; 1 month follow-up. I think the original sacral wound is closed however the patient has 2 areas distally 1 over the coccyx a small pinpoint area and the other into the gluteal cleft. Neither 1 of these looks particularly infected. I thought they might be connected but they were not. Objective Constitutional Sitting or standing Blood Pressure is within target range for patient.. Pulse regular and within target range for patient.Marland Kitchen Respirations regular, non-labored and within target range.. Temperature is normal and within the target range for the patient.Marland Kitchen Appears in no distress. Vitals Time Taken: 11:28 AM, Height: 65 in, Weight: 130 lbs, BMI: 21.6, Temperature: 97.8 F, Pulse: 76 bpm, Respiratory Rate: 18 breaths/min, Blood Pressure: 120/81 mmHg. General Notes: Wound exam; I think everything that was there initially closed however she now has 2 areas more distally than the original wounds. 1 over the tip of the coccyx the other just beyond which has a larger surface area. The left gluteal area is closed Integumentary (Hair, Skin) Wound #1 status  is Healed - Epithelialized. Original cause of wound was Pressure Injury. The wound is located on the Sacrum. The wound measures 0cm length x 0cm width x 0cm depth; 0cm^2 area and 0cm^3 volume. There is Fat Layer (Subcutaneous Tissue) exposed. There is no tunneling or undermining noted. There is a medium amount of serosanguineous drainage noted. The wound margin is thickened. There is large (67-100%) pink granulation within the wound bed. There is  a small (1-33%) amount of necrotic tissue within the wound bed including Adherent Slough. Wound #3 status is Open. Original cause of wound was Gradually Appeared. The wound is located on the Gluteal fold. The wound measures 0.3cm length x 0.2cm width x 0.9cm depth; 0.047cm^2 area and 0.042cm^3 volume. There is Fat Layer (Subcutaneous Tissue) exposed. There is no tunneling or undermining noted. There is a medium amount of serosanguineous drainage noted. The wound margin is flat and intact. There is large (67-100%) pink granulation within the wound bed. There is no necrotic tissue within the wound bed. Wound #4 status is Open. Original cause of wound was Gradually Appeared. The wound is located on the Coccyx. The wound measures 1cm length x 0.6cm width x 0.3cm depth; 0.471cm^2 area and 0.141cm^3 volume. There is Fat Layer (Subcutaneous Tissue) exposed. There is no tunneling or undermining noted. There is a small amount of serosanguineous drainage noted. The wound margin is well defined and not attached to the wound base. There is large (67-100%) red, pink granulation within the wound bed. There is no necrotic tissue within the wound bed. Assessment Active Problems ICD-10 Pressure ulcer of sacral region, stage 4 Disruption of external operation (surgical) wound, not elsewhere classified, subsequent encounter Plan Follow-up Appointments: Return appointment in 1 month. - please call clinic to cancel if pt moves to [place] Frequency: Wound #3 Gluteal fold: Change dressing every day. Wound #4 Coccyx: Change dressing every day. Wound Cleansing: Wound #3 Gluteal fold: Clean wound with Wound Cleanser Wound #4 Coccyx: Clean wound with Wound Cleanser Primary Wound Dressing: Wound #3 Gluteal fold: Calcium Alginate with Silver Wound #4 Coccyx: Calcium Alginate with Silver Secondary Dressing: Wound #3 Gluteal fold: Foam Border - or gauze and tape Wound #4 Coccyx: Foam Border  - or gauze and tape Off-Loading: Turn and reposition every 2 hours 1. Change the primary dressing to these 2 areas to silver alginate. I do not think these were the same areas we looked at last time. 2. She told me goodbye as I left the room and states she has been going she will be going to [place] to a facility although I am not sure how accurate that is. Electronic Signature(s) Signed: 02/22/2020 4:52:36 PM By: Linton Ham MD Entered By: Linton Ham on 02/22/2020 16:37:25 -------------------------------------------------------------------------------- SuperBill Details Patient Name: Date of Service: [name] Barnett, [name] Barnett 02/22/2020 Medical Record Number: [id number] Patient Account Number: [id number] Date of Birth/Sex: Treating RN: 09/24/1980 (39 y.o. F) [name] Primary Care Provider: SITA FA LWA LLA, SHA RMIN Other Clinician: Referring Provider: Treating Provider/Extender: Linton Ham SITA FA LWA LLA, SHA RMIN Weeks in Treatment: 36 Diagnosis Coding ICD-10 Codes Code Description L89.154 Pressure ulcer of sacral region, stage 4 T81.31XD Disruption of external operation (surgical) wound, not elsewhere classified, subsequent encounter Facility Procedures CPT4 Code: [id number] Description: 99214 - WOUND CARE VISIT-LEV 4 EST PT Modifier: Quantity: 1 Physician Procedures : CPT4 Code Description Modifier [id number] - WC PHYS LEVEL 2 - EST PT ICD-10 Diagnosis Description L89.154 Pressure ulcer of sacral region, stage 4 T81.31XD  Disruption of external operation (surgical) wound, not elsewhere classified, subsequent  encounter Quantity: 1 Electronic Signature(s) Signed: 02/22/2020 4:52:36 PM By: Linton Ham MD Entered By: Linton Ham on 02/22/2020 16:38:00

## 2020-02-25 ENCOUNTER — Ambulatory Visit (INDEPENDENT_AMBULATORY_CARE_PROVIDER_SITE_OTHER): Payer: Medicare Other | Admitting: Physician Assistant

## 2020-02-25 ENCOUNTER — Ambulatory Visit: Payer: Medicare Other | Admitting: Physician Assistant

## 2020-02-25 ENCOUNTER — Encounter: Payer: Self-pay | Admitting: Physician Assistant

## 2020-02-25 ENCOUNTER — Ambulatory Visit (INDEPENDENT_AMBULATORY_CARE_PROVIDER_SITE_OTHER): Payer: Medicare Other

## 2020-02-25 DIAGNOSIS — S82891A Other fracture of right lower leg, initial encounter for closed fracture: Secondary | ICD-10-CM

## 2020-02-25 NOTE — Progress Notes (Signed)
° °  Office Visit Note   Patient: [name] Barnett           Date of Birth: 06/26/1980           MRN: [id number] Visit Date: 02/25/2020              Requested by: Julianne Handler, DO [address] [place],  [place] [id number] PCP: Julianne Handler, DO   Assessment & Plan: Visit Diagnoses:  1. Closed fracture of right ankle, initial encounter     Plan: We will have her weightbearing as tolerated cam walker boot for the next 2 weeks.  Then she will transition over into the ASO brace.  She will follow-up with [place] in 1 month 3 views of the right ankle at that time.  Gentle range of motion the ankle is encouraged.  Questions encouraged and answered.  Follow-Up Instructions: Return in about 4 weeks (around 03/24/2020), or Dr.Blackman.   Orders:  Orders Placed This Encounter  Procedures   XR Ankle Complete Right   No orders of the defined types were placed in this encounter.     Procedures: No procedures performed   Clinical Data: No additional findings.   Subjective: Chief Complaint  Patient presents with   Right Ankle - Follow-up, Fracture    HPI [name] Barnett returns today follow-up of her right lateral malleolus fracture.  She is just over a month status post injury.  She has been in a cam walker boot.  She is just been transferring in the cam walker boot and not bearing full weight on the right ankle. Review of Systems Negative for shortness of breath fevers chills.  Objective: Vital Signs: There were no vitals taken for this visit.  Physical Exam General: Well-developed well-nourished female no acute distress Ortho Exam Right calf supple minimal tenderness.  Nontender over the lateral malleolus.  No tenderness over the medial malleolus.  Able to plantarflex the ankle and dorsiflex with loss of dorsiflexion by approximately 10 degrees.  Right foot dorsal pedal pulse present. Specialty Comments:  No specialty comments available.  Imaging: No  results found.   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 No pertinent family history.  Past Surgical History:  Procedure Laterality Date   cardiac stents     COLOSTOMY     wound on buttocks     Social History   Occupational History   Not on file  Tobacco Use   Smoking status: Never Smoker   Smokeless tobacco: Never Used  Substance and Sexual Activity   Alcohol use: Never   Drug use: Never   Sexual activity: Not on file

## 2020-02-25 NOTE — Progress Notes (Signed)
[name] Barnett, [name] Barnett ([id number]) Visit Report for 02/22/2020 Arrival Information Details Patient Name: Date of Service: [name] Barnett, [name] Barnett 02/22/2020 10:30 A M Medical Record Number: [id number] Patient Account Number: [id number] Date of Birth/Sex: Treating RN: Nov 12, 1980 (39 y.o. F) Dwiggins, [facility] Primary Care Rendy Lazard: SITA FA LWA LLA, SHA RMIN Other Clinician: Referring Jayce Kainz: Treating Jarome Trull/Extender: Linton Ham SITA FA LWA LLA, SHA RMIN Weeks in Treatment: 42 Visit Information History Since Last Visit Added or deleted any medications: No Patient Arrived: Wheel Chair Any new allergies or adverse reactions: No Arrival Time: 11:28 Had a fall or experienced change in No Accompanied By: facility staff member activities of daily living that may affect Transfer Assistance: EasyPivot Patient Lift risk of falls: Patient Identification Verified: Yes Signs or symptoms of abuse/neglect since last visito No Secondary Verification Process Completed: Yes Hospitalized since last visit: No Patient Requires Transmission-Based Precautions: No Implantable device outside of the clinic excluding No Patient Has Alerts: No cellular tissue based products placed in the center since last visit: Has Dressing in Place as Prescribed: Yes Pain Present Now: No Electronic Signature(s) Signed: 02/22/2020 5:00:52 PM By: Kela Millin Entered By: Kela Millin on 02/22/2020 11:28:50 -------------------------------------------------------------------------------- Clinic Level of Care Assessment Details Patient Name: Date of Service: [name] KIMMY, TOTTEN 02/22/2020 10:30 A M Medical Record Number: [id number] Patient Account Number: [id number] Date of Birth/Sex: Treating RN: January 09, 1981 (39 y.o. F) [name] Primary Care Delanna Blacketer: SITA FA LWA LLA, SHA RMIN Other Clinician: Referring Marthella Osorno: Treating Schylar Wuebker/Extender: Linton Ham SITA FA LWA LLA, SHA RMIN Weeks in  Treatment: 36 Clinic Level of Care Assessment Items TOOL 4 Quantity Score _0  - 0 Use when only an EandM is performed on FOLLOW-UP visit ASSESSMENTS - Nursing Assessment / Reassessment X- 1 10 Reassessment of Co-morbidities (includes updates in patient status) X- 1 5 Reassessment of Adherence to Treatment Plan ASSESSMENTS - Wound and Skin A ssessment / Reassessment _1  - 0 Simple Wound Assessment / Reassessment - one wound X- 3 5 Complex Wound Assessment / Reassessment - multiple wounds _2  - 0 Dermatologic / Skin Assessment (not related to wound area) ASSESSMENTS - Focused Assessment _3  - 0 Circumferential Edema Measurements - multi extremities _4  - 0 Nutritional Assessment / Counseling / Intervention _5  - 0 Lower Extremity Assessment (monofilament, tuning fork, pulses) _6  - 0 Peripheral Arterial Disease Assessment (using hand held doppler) ASSESSMENTS - Ostomy and/or Continence Assessment and Care _7  - 0 Incontinence Assessment and Management _8  - 0 Ostomy Care Assessment and Management (repouching, etc.) PROCESS - Coordination of Care X - Simple Patient / Family Education for ongoing care 1 15 _9  - 0 Complex (extensive) Patient / Family Education for ongoing care X- 1 10 Staff obtains Programmer, systems, Records, T Results / Process Orders est X- 1 10 Staff telephones HHA, Nursing Homes / Clarify orders / etc _10  - 0 Routine Transfer to another Facility (non-emergent condition) _11  - 0 Routine Hospital Admission (non-emergent condition) _12  - 0 New Admissions / Biomedical engineer / Ordering NPWT Apligraf, etc. , _13  - 0 Emergency Hospital Admission (emergent condition) X- 1 10 Simple Discharge Coordination _14  - 0 Complex (extensive) Discharge Coordination PROCESS - Special Needs _15  - 0 Pediatric / Minor Patient Management _16  - 0 Isolation Patient Management _17  - 0 Hearing / Language / Visual special needs _18  - 0 Assessment of Community assistance (transportation,  D/C planning, etc.) _19  - 0 Additional assistance / Altered mentation _20  - 0 Support Surface(s) Assessment (bed, cushion, seat, etc.) INTERVENTIONS - Wound Cleansing /  Measurement _0  - 0 Simple Wound Cleansing - one wound X- 3 5 Complex Wound Cleansing - multiple wounds X- 1 5 Wound Imaging (photographs - any number of wounds) _1  - 0 Wound Tracing (instead of photographs) _2  - 0 Simple Wound Measurement - one wound X- 3 5 Complex Wound Measurement - multiple wounds INTERVENTIONS - Wound Dressings X - Small Wound Dressing one or multiple wounds 2 10 _3  - 0 Medium Wound Dressing one or multiple wounds _4  - 0 Large Wound Dressing one or multiple wounds X- 1 5 Application of Medications - topical <[id number]>_7<\/[id number]>_0  - 0 Application of Medications - injection INTERVENTIONS - Miscellaneous _6  - 0 External ear exam _7  - 0 Specimen Collection (cultures, biopsies, blood, body fluids, etc.) _8  - 0 Specimen(s) / Culture(s) sent or taken to Lab for analysis _9  - 0 Patient Transfer (multiple staff / Civil Service fast streamer / Similar devices) _10  - 0 Simple Staple / Suture removal (25 or less) _11  - 0 Complex Staple / Suture removal (26 or more) _12  - 0 Hypo / Hyperglycemic Management (close monitor of Blood Glucose) _13  - 0 Ankle / Brachial Index (ABI) - do not check if billed separately X- 1 5 Vital Signs Has the patient been seen at the hospital within the last three years: Yes Total Score: 140 Level Of Care: New/Established - Level 4 Electronic Signature(s) Signed: 02/22/2020 5:21:31 PM By: [name] RN, BSN Entered By: [name] on 02/22/2020 12:00:36 -------------------------------------------------------------------------------- Encounter Discharge Information Details Patient Name: Date of Service: [name] Barnett, [name] Barnett 02/22/2020 10:30 A M Medical Record Number: [id number] Patient Account Number: [id number] Date of Birth/Sex: Treating RN: 24-Feb-1981 (39 y.o. [name] Primary  Care Crimson Beer: SITA FA LWA LLA, SHA RMIN Other Clinician: Referring Benard Minturn: Treating Arie Powell/Extender: Linton Ham SITA FA LWA LLA, SHA RMIN Weeks in Treatment: 36 Encounter Discharge Information Items Discharge Condition: Stable Ambulatory Status: Walker Discharge Destination: Home Transportation: Private Auto Accompanied By: caregiver Schedule Follow-up Appointment: Yes Clinical Summary of Care: Patient Declined Electronic Signature(s) Signed: 02/25/2020 5:02:41 PM By: Levan Hurst RN, BSN Entered By: Levan Hurst on 02/22/2020 12:39:36 -------------------------------------------------------------------------------- Lower Extremity Assessment Details Patient Name: Date of Service: [name] Barnett, [name] Barnett 02/22/2020 10:30 A M Medical Record Number: [id number] Patient Account Number: [id number] Date of Birth/Sex: Treating RN: 04-23-1981 (39 y.o. [name] Primary Care Sevin Farone: SITA FA Janace Aris, SHA RMIN Other Clinician: Referring Washington Whedbee: Treating Arlynn Stare/Extender: Linton Ham SITA FA LWA LLA, SHA RMIN Weeks in Treatment: 36 Electronic Signature(s) Signed: 02/22/2020 5:00:52 PM By: Kela Millin Entered By: Kela Millin on 02/22/2020 11:29:22 -------------------------------------------------------------------------------- Multi Wound Chart Details Patient Name: Date of Service: [name] Barnett, [name] Barnett 02/22/2020 10:30 A M Medical Record Number: [id number] Patient Account Number: [id number] Date of Birth/Sex: Treating RN: [birth year] (39 y.o. F) [name] Primary Care Conner Muegge: SITA FA LWA LLA, SHA RMIN Other Clinician: Referring Daelyn Pettaway: Treating Sherrill Mckamie/Extender: Linton Ham SITA FA LWA LLA, SHA RMIN Weeks in Treatment: 36 Vital Signs Height(in): 65 Pulse(bpm): 62 Weight(lbs): 130 Blood Pressure(mmHg): 120/81 Body Mass Index(BMI): 22 Temperature(F): 97.8 Respiratory Rate(breaths/min): 18 Photos: [1:No Photos Sacrum]  [3:No Photos Gluteal fold] [4:No Photos Coccyx] Wound Location: [1:Pressure Injury] [3:Gradually Appeared] [4:Gradually Appeared] Wounding Event: [1:Pressure Ulcer] [3:Pressure Ulcer] [4:Pressure Ulcer] Primary Etiology: [1:Necrotizing Infection] [3:N/A] [4:N/A] Secondary Etiology: [1:Cataracts, Glaucoma, Anemia,] [3:Cataracts, Glaucoma, Anemia,] [4:Cataracts, Glaucoma, Anemia,] Comorbid History: [1:Congestive Heart Failure, Coronary Congestive Heart Failure, Coronary Congestive Heart Failure, Coronary Artery Disease, Hypertension, Type II Artery Disease, Hypertension, Type II Artery Disease, Hypertension, Type II Diabetes,  End  Stage Renal Disease, Diabetes, End Stage Renal Disease, Diabetes, End Stage Renal Disease, Neuropathy, Confinement Anxiety 01/11/2019] [3:Neuropathy, Confinement Anxiety 11/26/2019] [4:Neuropathy, Confinement Anxiety 02/01/2020] Date Acquired: [1:36] [3:9] [4:0] Weeks of Treatment: [1:Healed - Epithelialized] [3:Open] [4:Open] Wound Status: [1:Yes] [3:No] [4:No] Clustered Wound: [1:0x0x0] [3:0.3x0.2x0.9] [4:1x0.6x0.3] Measurements L x W x D (cm) [1:0] [3:0.047] [4:0.471] A (cm) : rea [1:0] [3:0.042] [4:0.141] Volume (cm) : [1:100.00%] [3:92.20%] [4:N/A] % Reduction in A rea: [1:100.00%] [3:65.30%] [4:N/A] % Reduction in Volume: [1:Category/Stage IV] [3:Category/Stage II] [4:Category/Stage II] Classification: [1:Medium] [3:Medium] [4:Small] Exudate A mount: [1:Serosanguineous] [3:Serosanguineous] [4:Serosanguineous] Exudate Type: [1:red, brown] [3:red, brown] [4:red, brown] Exudate Color: [1:Thickened] [3:Flat and Intact] [4:Well defined, not attached] Wound Margin: [1:Large (67-100%)] [3:Large (67-100%)] [4:Large (67-100%)] Granulation A mount: [1:Pink] [3:Pink] [4:Red, Pink] Granulation Quality: [1:Small (1-33%)] [3:None Present (0%)] [4:None Present (0%)] Necrotic A mount: [1:Fat Layer (Subcutaneous Tissue): Yes Fat Layer (Subcutaneous Tissue): Yes Fat Layer  (Subcutaneous Tissue): Yes] Exposed Structures: [1:Fascia: No Tendon: No Muscle: No Joint: No Bone: No Medium (34-66%)] [3:Fascia: No Tendon: No Muscle: No Joint: No Bone: No Small (1-33%)] [4:Fascia: No Tendon: No Muscle: No Joint: No Bone: No None] Treatment Notes Wound #3 (Gluteal fold) 1. Cleanse With Wound Cleanser 3. Primary Dressing Applied Calcium Alginate Ag 4. Secondary Dressing Foam Border Dressing Wound #4 (Coccyx) 1. Cleanse With Wound Cleanser 3. Primary Dressing Applied Calcium Alginate Ag 4. Secondary Dressing Foam Border Dressing Electronic Signature(s) Signed: 02/22/2020 4:52:36 PM By: Linton Ham MD Signed: 02/22/2020 5:21:31 PM By: [name] RN, BSN Entered By: Linton Ham on 02/22/2020 16:33:26 -------------------------------------------------------------------------------- Multi-Disciplinary Care Plan Details Patient Name: Date of Service: [name] Barnett, [name] Barnett 02/22/2020 10:30 A M Medical Record Number: [id number] Patient Account Number: [id number] Date of Birth/Sex: Treating RN: 05-10-1980 (39 y.o. F) [name] Primary Care Carliss Porcaro: SITA FA LWA LLA, SHA RMIN Other Clinician: Referring Daysi Boggan: Treating Pacey Altizer/Extender: Linton Ham SITA FA LWA LLA, SHA RMIN Weeks in Treatment: 36 Active Inactive Pressure Nursing Diagnoses: Knowledge deficit related to management of pressures ulcers Goals: Patient/caregiver will verbalize understanding of pressure ulcer management Date Initiated: 06/15/2019 Target Resolution Date: 03/21/2020 Goal Status: Active Interventions: Provide education on pressure ulcers Notes: Wound/Skin Impairment Nursing Diagnoses: Impaired tissue integrity Goals: Patient/caregiver will verbalize understanding of skin care regimen Date Initiated: 08/27/2019 Target Resolution Date: 03/21/2020 Goal Status: Active Ulcer/skin breakdown will have a volume reduction of 50% by week 8 Date Initiated:  06/15/2019 Date Inactivated: 08/27/2019 Target Resolution Date: 08/03/2019 Goal Status: Met Interventions: Assess patient/caregiver ability to obtain necessary supplies Assess patient/caregiver ability to perform ulcer/skin care regimen upon admission and as needed Provide education on ulcer and skin care Notes: Electronic Signature(s) Signed: 02/22/2020 5:21:31 PM By: [name] RN, BSN Entered By: [name] on 02/22/2020 11:55:42 -------------------------------------------------------------------------------- Pain Assessment Details Patient Name: Date of Service: [name] Barnett, [name] Barnett 02/22/2020 10:30 [place] Record Number: [id number] Patient Account Number: [id number] Date of Birth/Sex: Treating RN: 05/26/1980 (39 y.o. F) Dwiggins, [facility] Primary Care Timea Breed: SITA FA LWA LLA, SHA RMIN Other Clinician: Referring Harkirat Orozco: Treating Mohan Erven/Extender: Linton Ham SITA FA LWA LLA, SHA RMIN Weeks in Treatment: 36 Active Problems Location of Pain Severity and Description of Pain Patient Has Paino No Site Locations Pain Management and Medication Current Pain Management: Electronic Signature(s) Signed: 02/22/2020 5:00:52 PM By: Kela Millin Entered By: Kela Millin on 02/22/2020 11:29:16 -------------------------------------------------------------------------------- Patient/Caregiver Education Details Patient Name: Date of Service: [name] Barnett, [place] 10/22/2021andnbsp10:30 [place] Record Number: [id number] Patient Account Number: [id number] Date  of Birth/Gender: Treating RN: Aug 24, 1980 (39 y.o. [name] Primary Care Physician: SITA FA LWA LLA, SHA RMIN Other Clinician: Referring Physician: Treating Physician/Extender: Linton Ham SITA FA LWA LLA, SHA RMIN Weeks in Treatment: 59 Education Assessment Education Provided To: Patient Education Topics Provided Pressure: Methods: Explain/Verbal Responses: Reinforcements  needed, State content correctly Wound/Skin Impairment: Methods: Explain/Verbal Responses: Reinforcements needed, State content correctly Electronic Signature(s) Signed: 02/22/2020 5:21:31 PM By: [name] RN, BSN Entered By: [name] on 02/22/2020 11:56:34 -------------------------------------------------------------------------------- Wound Assessment Details Patient Name: Date of Service: [name] Barnett, [name] Barnett 02/22/2020 10:30 A M Medical Record Number: [id number] Patient Account Number: [id number] Date of Birth/Sex: Treating RN: April 29, 1981 (39 y.o. F) [name] Primary Care Carlena Ruybal: SITA FA LWA LLA, SHA RMIN Other Clinician: Referring Oliana Gowens: Treating Dee Maday/Extender: Linton Ham SITA FA LWA LLA, SHA RMIN Weeks in Treatment: 36 Wound Status Wound Number: 1 Primary Pressure Ulcer Etiology: Wound Location: Sacrum Secondary Necrotizing Infection Wounding Event: Pressure Injury Etiology: Date Acquired: 01/11/2019 Wound Healed - Epithelialized Weeks Of Treatment: 36 Status: Clustered Wound: Yes Comorbid Cataracts, Glaucoma, Anemia, Congestive Heart Failure, Coronary History: Artery Disease, Hypertension, Type II Diabetes, End Stage Renal Disease, Neuropathy, Confinement Anxiety Wound Measurements Length: (cm) Width: (cm) Depth: (cm) Area: (cm) Volume: (cm) 0 % Reduction in Area: 100% 0 % Reduction in Volume: 100% 0 Epithelialization: Medium (34-66%) 0 Tunneling: No 0 Undermining: No Wound Description Classification: Category/Stage IV Wound Margin: Thickened Exudate Amount: Medium Exudate Type: Serosanguineous Exudate Color: red, brown Foul Odor After Cleansing: No Slough/Fibrino Yes Wound Bed Granulation Amount: Large (67-100%) Exposed Structure Granulation Quality: Pink Fascia Exposed: No Necrotic Amount: Small (1-33%) Fat Layer (Subcutaneous Tissue) Exposed: Yes Necrotic Quality: Adherent Slough Tendon Exposed: No Muscle  Exposed: No Joint Exposed: No Bone Exposed: No Electronic Signature(s) Signed: 02/22/2020 5:21:31 PM By: [name] RN, BSN Entered By: [name] on 02/22/2020 11:58:01 -------------------------------------------------------------------------------- Wound Assessment Details Patient Name: Date of Service: [name] DA ZAYNAB, [place] 02/22/2020 10:30 A M Medical Record Number: [id number] Patient Account Number: [id number] Date of Birth/Sex: Treating RN: [birth year] (39 y.o. F) Dwiggins, [facility] Primary Care Lavanna Rog: SITA FA LWA LLA, SHA RMIN Other Clinician: Referring Charleen Madera: Treating Detrick Dani/Extender: Linton Ham SITA FA LWA LLA, SHA RMIN Weeks in Treatment: 36 Wound Status Wound Number: 3 Primary Pressure Ulcer Etiology: Wound Location: Gluteal fold Wound Open Wounding Event: Gradually Appeared Status: Status: Date Acquired: 11/26/2019 Comorbid Cataracts, Glaucoma, Anemia, Congestive Heart Failure, Coronary Weeks Of Treatment: 9 History: Artery Disease, Hypertension, Type II Diabetes, End Stage Renal Clustered Wound: No Disease, Neuropathy, Confinement Anxiety Wound Measurements Length: (cm) 0.3 Width: (cm) 0.2 Depth: (cm) 0.9 Area: (cm) 0.047 Volume: (cm) 0.042 % Reduction in Area: 92.2% % Reduction in Volume: 65.3% Epithelialization: Small (1-33%) Tunneling: No Undermining: No Wound Description Classification: Category/Stage II Wound Margin: Flat and Intact Exudate Amount: Medium Exudate Type: Serosanguineous Exudate Color: red, brown Foul Odor After Cleansing: No Slough/Fibrino No Wound Bed Granulation Amount: Large (67-100%) Exposed Structure Granulation Quality: Pink Fascia Exposed: No Necrotic Amount: None Present (0%) Fat Layer (Subcutaneous Tissue) Exposed: Yes Tendon Exposed: No Muscle Exposed: No Joint Exposed: No Bone Exposed: No Treatment Notes Wound #3 (Gluteal fold) 1. Cleanse With Wound Cleanser 3. Primary Dressing  Applied Calcium Alginate Ag 4. Secondary Dressing Foam Border Dressing Electronic Signature(s) Signed: 02/22/2020 5:00:52 PM By: Kela Millin Entered By: Kela Millin on 02/22/2020 11:31:09 -------------------------------------------------------------------------------- Wound Assessment Details Patient Name: Date of Service: [name] DA JADIE, [place] 02/22/2020 10:30 A M Medical Record Number: [id number] Patient Account  Number: [id number] Date of Birth/Sex: Treating RN: June 07, 1980 (39 y.o. F) Dwiggins, [facility] Primary Care Axie Hayne: SITA FA LWA LLA, SHA RMIN Other Clinician: Referring Loman Logan: Treating Shamell Hittle/Extender: Linton Ham SITA FA LWA LLA, SHA RMIN Weeks in Treatment: 36 Wound Status Wound Number: 4 Primary Pressure Ulcer Etiology: Wound Location: Coccyx Wound Open Wounding Event: Gradually Appeared Status: Date Acquired: 02/01/2020 Comorbid Cataracts, Glaucoma, Anemia, Congestive Heart Failure, Coronary Weeks Of Treatment: 0 History: Artery Disease, Hypertension, Type II Diabetes, End Stage Renal Clustered Wound: No Disease, Neuropathy, Confinement Anxiety Wound Measurements Length: (cm) 1 Width: (cm) 0.6 Depth: (cm) 0.3 Area: (cm) 0.471 Volume: (cm) 0.141 % Reduction in Area: % Reduction in Volume: Epithelialization: None Tunneling: No Undermining: No Wound Description Classification: Category/Stage II Wound Margin: Well defined, not attached Exudate Amount: Small Exudate Type: Serosanguineous Exudate Color: red, brown Foul Odor After Cleansing: No Slough/Fibrino No Wound Bed Granulation Amount: Large (67-100%) Exposed Structure Granulation Quality: Red, Pink Fascia Exposed: No Necrotic Amount: None Present (0%) Fat Layer (Subcutaneous Tissue) Exposed: Yes Tendon Exposed: No Muscle Exposed: No Joint Exposed: No Bone Exposed: No Treatment Notes Wound #4 (Coccyx) 1. Cleanse With Wound Cleanser 3. Primary Dressing  Applied Calcium Alginate Ag 4. Secondary Dressing Foam Border Dressing Electronic Signature(s) Signed: 02/22/2020 5:00:52 PM By: Kela Millin Entered By: Kela Millin on 02/22/2020 11:33:21 -------------------------------------------------------------------------------- Vitals Details Patient Name: Date of Service: [name] Barnett, [place] 02/22/2020 10:30 A M Medical Record Number: [id number] Patient Account Number: [id number] Date of Birth/Sex: Treating RN: 09-14-1980 (39 y.o. F) Dwiggins, [facility] Primary Care Eriberto Felch: SITA FA LWA LLA, SHA RMIN Other Clinician: Referring Jeffrie Stander: Treating Jaquala Fuller/Extender: Linton Ham SITA FA LWA LLA, SHA RMIN Weeks in Treatment: 36 Vital Signs Time Taken: 11:28 Temperature (F): 97.8 Height (in): 65 Pulse (bpm): 76 Weight (lbs): 130 Respiratory Rate (breaths/min): 18 Body Mass Index (BMI): 21.6 Blood Pressure (mmHg): 120/81 Reference Range: 80 - 120 mg / dl Electronic Signature(s) Signed: 02/22/2020 5:00:52 PM By: Kela Millin Entered By: Kela Millin on 02/22/2020 11:29:10

## 2020-03-17 ENCOUNTER — Other Ambulatory Visit: Payer: Self-pay

## 2020-03-17 ENCOUNTER — Encounter (HOSPITAL_BASED_OUTPATIENT_CLINIC_OR_DEPARTMENT_OTHER): Payer: Medicare Other | Attending: Internal Medicine | Admitting: Internal Medicine

## 2020-03-17 DIAGNOSIS — I509 Heart failure, unspecified: Secondary | ICD-10-CM | POA: Diagnosis not present

## 2020-03-17 DIAGNOSIS — N186 End stage renal disease: Secondary | ICD-10-CM | POA: Insufficient documentation

## 2020-03-17 DIAGNOSIS — E1140 Type 2 diabetes mellitus with diabetic neuropathy, unspecified: Secondary | ICD-10-CM | POA: Insufficient documentation

## 2020-03-17 DIAGNOSIS — L89154 Pressure ulcer of sacral region, stage 4: Secondary | ICD-10-CM | POA: Diagnosis present

## 2020-03-17 DIAGNOSIS — Z992 Dependence on renal dialysis: Secondary | ICD-10-CM | POA: Insufficient documentation

## 2020-03-17 DIAGNOSIS — E1122 Type 2 diabetes mellitus with diabetic chronic kidney disease: Secondary | ICD-10-CM | POA: Diagnosis not present

## 2020-03-17 DIAGNOSIS — I132 Hypertensive heart and chronic kidney disease with heart failure and with stage 5 chronic kidney disease, or end stage renal disease: Secondary | ICD-10-CM | POA: Insufficient documentation

## 2020-03-18 NOTE — Progress Notes (Signed)
[name] Barnett ([id number]) Visit Report for 03/17/2020 Arrival Information Details Patient Name: Date of Service: [name] Barnett, [name] Barnett 03/17/2020 2:30 PM Medical Record Number: [id number] Patient Account Number: [id number] Date of Birth/Sex: Treating RN: 09/16/1980 (40 y.o. F) [name] Primary Care [name] Barnett: SITA FA LWA LLA, SHA RMIN Other Clinician: Referring [name] Barnett: Treating [name] Barnett/Extender: Linton Ham SITA FA LWA LLA, SHA RMIN Weeks in Treatment: 22 Visit Information History Since Last Visit Added or deleted any medications: No Patient Arrived: Wheel Chair Any new allergies or adverse reactions: No Arrival Time: 14:34 Had a fall or experienced change in No Accompanied By: facility staff activities of daily living that may affect Transfer Assistance: None risk of falls: Patient Identification Verified: Yes Signs or symptoms of abuse/neglect since last visito No Secondary Verification Process Completed: Yes Hospitalized since last visit: Yes Patient Requires Transmission-Based Precautions: No Implantable device outside of the clinic excluding No Patient Has Alerts: No cellular tissue based products placed in the center since last visit: Has Dressing in Place as Prescribed: Yes Pain Present Now: Yes Electronic Signature(s) Signed: 03/18/2020 5:21:16 PM By: [name] RN, BSN Entered By: [name] on 03/17/2020 14:35:35 -------------------------------------------------------------------------------- Clinic Level of Care Assessment Details Patient Name: Date of Service: [name] Barnett, [name] Barnett 03/17/2020 2:30 PM Medical Record Number: [id number] Patient Account Number: [id number] Date of Birth/Sex: Treating RN: 02/05/1981 (39 y.o. [name] Primary Care [name] Barnett: SITA FA LWA LLA, SHA RMIN Other Clinician: Referring [name] Barnett: Treating [name] Barnett/Extender: Linton Ham SITA FA LWA LLA, SHA RMIN Weeks in Treatment: 39 Clinic Level  of Care Assessment Items TOOL 4 Quantity Score X- 1 0 Use when only an EandM is performed on FOLLOW-UP visit ASSESSMENTS - Nursing Assessment / Reassessment X- 1 10 Reassessment of Co-morbidities (includes updates in patient status) X- 1 5 Reassessment of Adherence to Treatment Plan ASSESSMENTS - Wound and Skin A ssessment / Reassessment X - Simple Wound Assessment / Reassessment - one wound 1 5 _0  - 0 Complex Wound Assessment / Reassessment - multiple wounds _1  - 0 Dermatologic / Skin Assessment (not related to wound area) ASSESSMENTS - Focused Assessment _2  - 0 Circumferential Edema Measurements - multi extremities _3  - 0 Nutritional Assessment / Counseling / Intervention _4  - 0 Lower Extremity Assessment (monofilament, tuning fork, pulses) _5  - 0 Peripheral Arterial Disease Assessment (using hand held doppler) ASSESSMENTS - Ostomy and/or Continence Assessment and Care _6  - 0 Incontinence Assessment and Management _7  - 0 Ostomy Care Assessment and Management (repouching, etc.) PROCESS - Coordination of Care X - Simple Patient / Family Education for ongoing care 1 15 _8  - 0 Complex (extensive) Patient / Family Education for ongoing care X- 1 10 Staff obtains Programmer, systems, Records, T Results / Process Orders est X- 1 10 Staff telephones HHA, Nursing Homes / Clarify orders / etc _9  - 0 Routine Transfer to another Facility (non-emergent condition) _10  - 0 Routine Hospital Admission (non-emergent condition) _11  - 0 New Admissions / Biomedical engineer / Ordering NPWT Apligraf, etc. , _12  - 0 Emergency Hospital Admission (emergent condition) X- 1 10 Simple Discharge Coordination _13  - 0 Complex (extensive) Discharge Coordination PROCESS - Special Needs _14  - 0 Pediatric / Minor Patient Management _15  - 0 Isolation Patient Management _16  - 0 Hearing / Language / Visual special needs _17  - 0 Assessment of Community assistance (transportation, D/C planning, etc.) _18  -  0 Additional assistance / Altered mentation _19  - 0 Support Surface(s) Assessment (bed, cushion, seat, etc.) INTERVENTIONS - Wound Cleansing / Measurement X -  Simple Wound Cleansing - one wound 1 5 _0  - 0 Complex Wound Cleansing - multiple wounds X- 1 5 Wound Imaging (photographs - any number of wounds) _1  - 0 Wound Tracing (instead of photographs) X- 1 5 Simple Wound Measurement - one wound _2  - 0 Complex Wound Measurement - multiple wounds INTERVENTIONS - Wound Dressings X - Small Wound Dressing one or multiple wounds 1 10 _3  - 0 Medium Wound Dressing one or multiple wounds _4  - 0 Large Wound Dressing one or multiple wounds X- 1 5 Application of Medications - topical <LFYBOFBPZWCHENID>_7<\/OEUMPNTIRWERXVQM>_0  - 0 Application of Medications - injection INTERVENTIONS - Miscellaneous _6  - 0 External ear exam _7  - 0 Specimen Collection (cultures, biopsies, blood, body fluids, etc.) _8  - 0 Specimen(s) / Culture(s) sent or taken to Lab for analysis _9  - 0 Patient Transfer (multiple staff / Civil Service fast streamer / Similar devices) _10  - 0 Simple Staple / Suture removal (25 or less) _11  - 0 Complex Staple / Suture removal (26 or more) _12  - 0 Hypo / Hyperglycemic Management (close monitor of Blood Glucose) _13  - 0 Ankle / Brachial Index (ABI) - do not check if billed separately X- 1 5 Vital Signs Has the patient been seen at the hospital within the last three years: Yes Total Score: 100 Level Of Care: New/Established - Level 3 Electronic Signature(s) Signed: 03/17/2020 5:51:53 PM By: Levan Hurst RN, BSN Entered By: Levan Hurst on 03/17/2020 17:27:33 -------------------------------------------------------------------------------- Encounter Discharge Information Details Patient Name: Date of Service: [name] Barnett, [name] Barnett 03/17/2020 2:30 PM Medical Record Number: [id number] Patient Account Number: [id number] Date of Birth/Sex: Treating RN: Jan 11, 1981 (39 y.o. Debby Bud Primary Care Kassadie Pancake: SITA FA LWA LLA,  SHA RMIN Other Clinician: Referring Cataleah Stites: Treating Catelin Manthe/Extender: Linton Ham SITA FA LWA LLA, SHA RMIN Weeks in Treatment: 39 Encounter Discharge Information Items Discharge Condition: Stable Ambulatory Status: Wheelchair Discharge Destination: Home Transportation: Private Auto Accompanied By: caregiver Schedule Follow-up Appointment: Yes Clinical Summary of Care: Electronic Signature(s) Signed: 03/17/2020 6:05:18 PM By: Deon Pilling Entered By: Deon Pilling on 03/17/2020 17:58:44 -------------------------------------------------------------------------------- Lower Extremity Assessment Details Patient Name: Date of Service: [name] SHANYN, PREISLER 03/17/2020 2:30 PM Medical Record Number: [id number] Patient Account Number: [id number] Date of Birth/Sex: Treating RN: 27-Dec-1980 (39 y.o. [name] Primary Care Eran Mistry: SITA FA Janace Aris, SHA RMIN Other Clinician: Referring Lillyahna Hemberger: Treating Elizebeth Kluesner/Extender: Linton Ham SITA FA LWA LLA, SHA RMIN Weeks in Treatment: 39 Electronic Signature(s) Signed: 03/18/2020 5:21:16 PM By: [name] RN, BSN Entered By: [name] on 03/17/2020 14:37:23 -------------------------------------------------------------------------------- Multi Wound Chart Details Patient Name: Date of Service: [name] Barnett, [name] Barnett 03/17/2020 2:30 PM Medical Record Number: [id number] Patient Account Number: [id number] Date of Birth/Sex: Treating RN: [birth year] (39 y.o. [name] Primary Care Hussein Macdougal: SITA FA LWA LLA, SHA RMIN Other Clinician: Referring Sparkle Aube: Treating Wally Shevchenko/Extender: Linton Ham SITA FA LWA LLA, SHA RMIN Weeks in Treatment: 39 Vital Signs Height(in): 65 Capillary Blood Glucose(mg/dl): 117 Weight(lbs): 130 Pulse(bpm): 32 Body Mass Index(BMI): 22 Blood Pressure(mmHg): 143/84 Temperature(F): 97.7 Respiratory Rate(breaths/min): 18 Photos: [3:No Photos Gluteal fold] [4:No Photos  Coccyx] [N/A:N/A N/A] Wound Location: [3:Gradually Appeared] [4:Gradually Appeared] [N/A:N/A] Wounding Event: [3:Pressure Ulcer] [4:Pressure Ulcer] [N/A:N/A] Primary Etiology: [3:Cataracts, Glaucoma, Anemia,] [4:Cataracts, Glaucoma, Anemia,] [N/A:N/A] Comorbid History: [3:Congestive Heart Failure, Coronary Congestive Heart Failure, Coronary Artery Disease, Hypertension, Type II Artery Disease, Hypertension, Type II Diabetes, End Stage Renal Disease, Diabetes, End Stage Renal Disease, Neuropathy,  Confinement Anxiety 11/26/2019] [4:Neuropathy, Confinement Anxiety 02/01/2020] [N/A:N/A] Date Acquired: [3:13] [4:3] [  N/A:N/A] Weeks of Treatment: [3:Open] [4:Healed - Epithelialized] [N/A:N/A] Wound Status: [3:1.4x0.7x0.7] [4:0x0x0] [N/A:N/A] Measurements L x W x D (cm) [3:0.77] [4:0] [N/A:N/A] A (cm) : rea [3:0.539] [4:0] [N/A:N/A] Volume (cm) : [3:-27.30%] [4:100.00%] [N/A:N/A] % Reduction in A rea: [3:-345.50%] [4:100.00%] [N/A:N/A] % Reduction in Volume: [3:Category/Stage II] [4:Category/Stage II] [N/A:N/A] Classification: [3:Small] [4:None Present] [N/A:N/A] Exudate A mount: [3:Serous] [4:N/A] [N/A:N/A] Exudate Type: [3:amber] [4:N/A] [N/A:N/A] Exudate Color: [3:Distinct, outline attached] [4:Indistinct, nonvisible] [N/A:N/A] Wound Margin: [3:Large (67-100%)] [4:None Present (0%)] [N/A:N/A] Granulation A mount: [3:Pink] [4:N/A] [N/A:N/A] Granulation Quality: [3:Small (1-33%)] [4:None Present (0%)] [N/A:N/A] Necrotic A mount: [3:Fat Layer (Subcutaneous Tissue): Yes Fascia: No] [N/A:N/A] Exposed Structures: [3:Fascia: No Tendon: No Muscle: No Joint: No Bone: No None] [4:Fat Layer (Subcutaneous Tissue): No Tendon: No Muscle: No Joint: No Bone: No Large (67-100%)] [N/A:N/A] Treatment Notes Electronic Signature(s) Signed: 03/17/2020 5:39:18 PM By: Linton Ham MD Signed: 03/17/2020 5:51:53 PM By: Levan Hurst RN, BSN Entered By: Linton Ham on 03/17/2020  16:46:00 -------------------------------------------------------------------------------- Multi-Disciplinary Care Plan Details Patient Name: Date of Service: [name] Barnett, [name] Barnett 03/17/2020 2:30 PM Medical Record Number: [id number] Patient Account Number: [id number] Date of Birth/Sex: Treating RN: December 25, 1980 (39 y.o. [name] Primary Care Liisa Picone: SITA FA LWA LLA, SHA RMIN Other Clinician: Referring Camarion Weier: Treating Avraj Lindroth/Extender: Linton Ham SITA FA LWA LLA, SHA RMIN Weeks in Treatment: 39 Active Inactive Wound/Skin Impairment Nursing Diagnoses: Impaired tissue integrity Goals: Patient/caregiver will verbalize understanding of skin care regimen Date Initiated: 08/27/2019 Target Resolution Date: 04/18/2020 Goal Status: Active Ulcer/skin breakdown will have a volume reduction of 50% by week 8 Date Initiated: 06/15/2019 Date Inactivated: 08/27/2019 Target Resolution Date: 08/03/2019 Goal Status: Met Interventions: Assess patient/caregiver ability to obtain necessary supplies Assess patient/caregiver ability to perform ulcer/skin care regimen upon admission and as needed Provide education on ulcer and skin care Notes: Electronic Signature(s) Signed: 03/17/2020 5:51:53 PM By: Levan Hurst RN, BSN Entered By: Levan Hurst on 03/17/2020 15:37:47 -------------------------------------------------------------------------------- Pain Assessment Details Patient Name: Date of Service: [name] Barnett, Gary 03/17/2020 2:30 PM Medical Record Number: [id number] Patient Account Number: [id number] Date of Birth/Sex: Treating RN: 1981/01/06 (39 y.o. F) [name] Primary Care Stephanny Tsutsui: SITA FA LWA LLA, SHA RMIN Other Clinician: Referring Manahil Vanzile: Treating Jeryl Wilbourn/Extender: Linton Ham SITA FA LWA LLA, SHA RMIN Weeks in Treatment: 39 Active Problems Location of Pain Severity and Description of Pain Patient Has Paino Yes Site Locations Pain  Location: Pain in Ulcers With Dressing Change: Yes Duration of the Pain. Constant / Intermittento Intermittent Rate the pain. Current Pain Level: 7 Least Pain Level: 0 Character of Pain Describe the Pain: Aching, Other: soreness Pain Management and Medication Current Pain Management: Medication: Yes Is the Current Pain Management Adequate: Adequate How does your wound impact your activities of daily livingo Sleep: Yes Bathing: No Appetite: No Relationship With Others: No Bladder Continence: No Emotions: Yes Bowel Continence: No Hobbies: No Toileting: No Dressing: No Electronic Signature(s) Signed: 03/18/2020 5:21:16 PM By: [name] RN, BSN Entered By: [name] on 03/17/2020 14:37:15 -------------------------------------------------------------------------------- Patient/Caregiver Education Details Patient Name: Date of Service: [name] Barnett 11/15/2021andnbsp2:30 PM Medical Record Number: [id number] Patient Account Number: [id number] Date of Birth/Gender: Treating RN: 26-Jan-1981 (39 y.o. [name] Primary Care Physician: SITA FA LWA LLA, SHA RMIN Other Clinician: Referring Physician: Treating Physician/Extender: Linton Ham SITA FA LWA LLA, SHA RMIN Weeks in Treatment: 61 Education Assessment Education Provided To: Patient Education Topics Provided Wound/Skin Impairment: Methods: Explain/Verbal Responses: State content correctly Electronic Signature(s) Signed: 03/17/2020 5:51:53 PM  By: Levan Hurst RN, BSN Entered By: Levan Hurst on 03/17/2020 15:37:58 -------------------------------------------------------------------------------- Wound Assessment Details Patient Name: Date of Service: [name] Barnett, [name] Barnett 03/17/2020 2:30 PM Medical Record Number: [id number] Patient Account Number: [id number] Date of Birth/Sex: Treating RN: January 28, 1981 (39 y.o. F) [name] Primary Care Marshawn Normoyle: SITA FA LWA LLA, SHA  RMIN Other Clinician: Referring Fabiola Mudgett: Treating Larrissa Stivers/Extender: Linton Ham SITA FA LWA LLA, SHA RMIN Weeks in Treatment: 39 Wound Status Wound Number: 3 Primary Pressure Ulcer Etiology: Wound Location: Gluteal fold Wound Open Wounding Event: Gradually Appeared Status: Date Acquired: 11/26/2019 Comorbid Cataracts, Glaucoma, Anemia, Congestive Heart Failure, Coronary Weeks Of Treatment: 13 History: Artery Disease, Hypertension, Type II Diabetes, End Stage Renal Clustered Wound: No Disease, Neuropathy, Confinement Anxiety Wound Measurements Length: (cm) 1.4 Width: (cm) 0.7 Depth: (cm) 0.7 Area: (cm) 0.77 Volume: (cm) 0.539 Wound Description Classification: Category/Stage II Wound Margin: Distinct, outline attached Exudate Amount: Small Exudate Type: Serous Exudate Color: amber Foul Odor After Cleansing: Slough/Fibrino % Reduction in Area: -27.3% % Reduction in Volume: -345.5% Epithelialization: None Tunneling: No Undermining: No No No Wound Bed Granulation Amount: Large (67-100%) Exposed Structure Granulation Quality: Pink Fascia Exposed: No Necrotic Amount: Small (1-33%) Fat Layer (Subcutaneous Tissue) Exposed: Yes Necrotic Quality: Adherent Slough Tendon Exposed: No Muscle Exposed: No Joint Exposed: No Bone Exposed: No Treatment Notes Wound #3 (Gluteal fold) 1. Cleanse With Wound Cleanser 2. Periwound Care Skin Prep 3. Primary Dressing Applied Calcium Alginate Ag 4. Secondary Dressing Foam Border Dressing 5. Secured With Office manager) Signed: 03/18/2020 5:21:16 PM By: [name] RN, BSN Entered By: [name] on 03/17/2020 14:45:55 -------------------------------------------------------------------------------- Wound Assessment Details Patient Name: Date of Service: [name] Barnett, [name] Barnett 03/17/2020 2:30 PM Medical Record Number: [id number] Patient Account Number: [id number] Date of  Birth/Sex: Treating RN: 13-Jul-1980 (39 y.o. F) [name] Primary Care Nancylee Gaines: SITA FA LWA LLA, SHA RMIN Other Clinician: Referring Tajuana Kniskern: Treating Lira Stephen/Extender: Linton Ham SITA FA LWA LLA, SHA RMIN Weeks in Treatment: 39 Wound Status Wound Number: 4 Primary Pressure Ulcer Etiology: Wound Location: Coccyx Wound Healed - Epithelialized Wounding Event: Gradually Appeared Status: Date Acquired: 02/01/2020 Comorbid Cataracts, Glaucoma, Anemia, Congestive Heart Failure, Coronary Weeks Of Treatment: 3 History: Artery Disease, Hypertension, Type II Diabetes, End Stage Renal Clustered Wound: No Disease, Neuropathy, Confinement Anxiety Wound Measurements Length: (cm) Width: (cm) Depth: (cm) Area: (cm) Volume: (cm) 0 % Reduction in Area: 100% 0 % Reduction in Volume: 100% 0 Epithelialization: Large (67-100%) 0 Tunneling: No 0 Undermining: No Wound Description Classification: Category/Stage II Wound Margin: Indistinct, nonvisible Exudate Amount: None Present Wound Bed Granulation Amount: None Present (0%) Necrotic Amount: None Present (0%) Foul Odor After Cleansing: No Slough/Fibrino No Exposed Structure Fascia Exposed: No Fat Layer (Subcutaneous Tissue) Exposed: No Tendon Exposed: No Muscle Exposed: No Joint Exposed: No Bone Exposed: No Electronic Signature(s) Signed: 03/18/2020 5:21:16 PM By: [name] RN, BSN Entered By: [name] on 03/17/2020 14:46:16 -------------------------------------------------------------------------------- [place] Details Patient Name: Date of Service: [name] Barnett, Tiro 03/17/2020 2:30 PM Medical Record Number: [id number] Patient Account Number: [id number] Date of Birth/Sex: Treating RN: Mar 10, 1981 (39 y.o. F) [name] Primary Care Ethan Clayburn: SITA FA LWA LLA, SHA RMIN Other Clinician: Referring Kasson Lamere: Treating Darielys Giglia/Extender: Linton Ham SITA FA LWA LLA, SHA RMIN Weeks in Treatment:  39 Vital Signs Time Taken: 14:35 Temperature (F): 97.7 Height (in): 65 Pulse (bpm): 76 Weight (lbs): 130 Respiratory Rate (breaths/min): 18 Body Mass Index (BMI): 21.6 Blood Pressure (mmHg): 143/84 Capillary Blood Glucose (mg/dl): 117 Reference  Range: 80 - 120 mg / dl Notes glucose per pt report this am Electronic Signature(s) Signed: 03/18/2020 5:21:16 PM By: [name] RN, BSN Entered By: [name] on 03/17/2020 14:36:23

## 2020-03-18 NOTE — Progress Notes (Signed)
[name] Barnett, [name] Barnett ([id number]) Visit Report for 03/17/2020 HPI Details Patient Name: Date of Service: [name] Barnett, [name] Barnett 03/17/2020 2:30 PM Medical Record Number: [id number] Patient Account Number: [id number] Date of Birth/Sex: Treating RN: 1981/02/23 (39 y.o. [name] Primary Care Provider: SITA FA LWA LLA, SHA RMIN Other Clinician: Referring Provider: Treating Provider/Extender: Linton Ham SITA FA LWA LLA, SHA RMIN Weeks in Treatment: 39 History of Present Illness HPI Description: Admission 06/15/2019 This is a 39 year old woman who comes to [place] from [facility] skilled facility in [place]. I am able to follow a lot of her history on care everywhere in epic. The patient is a type II diabetic and has been on dialysis. 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 I am not exactly sure of the nature of the stroke at this present time. She has made a decent recovery and is now up walking with a walker. In August 2020 she was admitted to [facility] in [place] with L4-L5 discitis. Culture and sensitivity showed Klebsiella. She was discharged on 8 weeks of ceftazidime. In mid September she is readmitted to the hospital with a pressure ulcer on her lower sacrum and necrotizing fasciitis. She required an extensive surgical debridement. CT scan of the pelvis at 1 point showed communication with the rectum. A colostomy was recommended and placed. Culture of this area showed Klebsiella and yeast. She was discharged on an extended course of meropenem and Diflucan. [facility] [place] We do not have any of these records. I think she was discharged to another nursing home but is come to [facility] skilled facility in [facility]. She was sent down here for review of the remaining wound. By review of the pictures that the patient had from  September this is made considerable improvement. She tells me that the wound VAC was discontinued at the end of December 2020. Currently the facility is using a form of silver alginate to the wound surface. Past medical history type 2 diabetes with chronic renal failure on dialysis Tuesday Thursday and Saturday. CVA with left hemiparesis in June 2020 but she appears to be making a good recovery. Chronic diastolic heart failure, hypertension 2/19; patient's wound area slightly smaller. The area that was towards her anus has closed over. We are using Santyl covered with Hydrofera Blue. We have not been able to get her lab work apparently her veins are very difficult for phlebotomy. They are trying to get the blood work done where she dialyzes in [facility] but that requires some logistical issues. 2/26; not much change in the wound. Still a nonviable surface we have been using Santyl and Hydrofera Blue. I changed her to [facility] only. She tells me she has rigorously offloading this area she also complains of some pain 3/5; still no change here. I switch to pure Hydrofera Blue. She still complains of a lot of pain although is a big wound it seems out of proportion to what I might expect. This was initially a surgical wound secondary to necrotizing wound infection. She is on dialysis. Other than that she thinks she is offloading this fairly rigorously. She has not been systemically unwell 3/26; I switched to Iodoflex 3 weeks ago. This really seems to have helped, much better surface and slight improvement in the surface area. 4/26; the patient is still on Iodoflex she missed an appointment 2 weeks ago.  Doing slightly smaller. She is on dialysis 5/10; using Hydrofera Blue. The major improvement here is the complete lack of depth. She has islands of epithelialization 09/24/19-Patient comes in at 2 weeks, dimensions about the same, there is an [place] of epithelium at margin of wound, no slough  noted 10/15/2019 upon evaluation today patient appears to be doing well with regard to her wound. She has been tolerating the dressing changes in the sacral region without complication and at this point she seems to be healing quite nicely. I am extremely pleased with how things are progressing and the epithelial tissue seems to be spreading from the central portion as well which is also excellent. 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 We have been using Hydrofera Blue. The smaller area which has depth at 0.7 cm there is a question about whether they have been packing anything into this or just flaring Hydrofera Blue over the top 9/23; its been more than 2 months since this patient was here. She still has a small area distally which is the remanent of her stage IV wound. They have been using Hydrofera Blue. She also has an area on the left gluteal 10/22; 1 month follow-up. I think the original sacral wound is closed however the patient has 2 areas distally 1 over the coccyx a small pinpoint area and the other into the gluteal cleft. Neither 1 of these looks particularly infected. I thought they might be connected but they were not. 11/15; the original sacral wound is closed patient only has 1 remaining area. 1 of these is in the gluteal cleft just below the coccyx. The area over the coccyx from last time I think is all so closed Electronic Signature(s) Signed: 03/17/2020 5:39:18 PM By: Linton Ham MD Entered By: Linton Ham on 03/17/2020 16:46:44 -------------------------------------------------------------------------------- Physical Exam Details Patient Name: Date of Service: [name] Barnett, [name] Barnett 03/17/2020 2:30 PM Medical Record Number: [id number] Patient Account Number: [id number] Date of Birth/Sex: Treating RN: May 31, 1980 (39 y.o. [name] Primary Care Provider: SITA FA LWA LLA, SHA RMIN Other  Clinician: Referring Provider: Treating Provider/Extender: Linton Ham SITA FA LWA LLA, SHA RMIN Weeks in Treatment: 39 Constitutional Patient is hypertensive.. Pulse regular and within target range for patient.Marland Kitchen Respirations regular, non-labored and within target range.. Temperature is normal and within the target range for the patient.Marland Kitchen Appears in no distress. Respiratory work of breathing is normal. Psychiatric appears at normal baseline. Notes Wound exam; the patient has 1 remaining wound. This is in the gluteal cleft just below the sacrum. Small wound but with the central part deeper 0.7 cm in overall depth which is an improvement from last time at 1 cm. The area over the tip of the coccyx is closed Electronic Signature(s) Signed: 03/17/2020 5:39:18 PM By: Linton Ham MD Entered By: Linton Ham on 03/17/2020 16:47:42 -------------------------------------------------------------------------------- Physician Orders Details Patient Name: Date of Service: [name] Barnett, [name] Barnett 03/17/2020 2:30 PM Medical Record Number: [id number] Patient Account Number: [id number] Date of Birth/Sex: Treating RN: 02-May-1981 (39 y.o. [name] Primary Care Provider: SITA FA LWA LLA, SHA RMIN Other Clinician: Referring Provider: Treating Provider/Extender: Linton Ham SITA FA LWA LLA, SHA RMIN Weeks in Treatment: 60 Verbal / Phone Orders: No Diagnosis Coding ICD-10 Coding Code Description L89.154 Pressure ulcer of sacral region, stage 4 T81.31XD Disruption of external operation (surgical) wound, not elsewhere classified, subsequent encounter Follow-up Appointments Return appointment in 1 month. - please call  clinic to cancel if pt moves to [place] Frequency Wound #3 Gluteal fold Change Dressing every other day. Skin Barriers/Peri-Wound Care Skin Prep Wound Cleansing Wound #3 Gluteal fold Clean wound with Wound Cleanser Primary Wound  Dressing Wound #3 Gluteal fold lginate with Silver - lightly pack Calcium A Secondary Dressing Wound #3 Gluteal fold Foam Border - or gauze and tape Off-Loading Turn and reposition every 2 hours Electronic Signature(s) Signed: 03/17/2020 5:39:18 PM By: Linton Ham MD Signed: 03/17/2020 5:51:53 PM By: Levan Hurst RN, BSN Entered By: Levan Hurst on 03/17/2020 15:37:13 -------------------------------------------------------------------------------- Problem List Details Patient Name: Date of Service: [name] Barnett, [name] Barnett 03/17/2020 2:30 PM Medical Record Number: [id number] Patient Account Number: [id number] Date of Birth/Sex: Treating RN: 1981-03-17 (39 y.o. [name] Primary Care Provider: SITA FA LWA LLA, SHA RMIN Other Clinician: Referring Provider: Treating Provider/Extender: Linton Ham SITA FA LWA LLA, SHA RMIN Weeks in Treatment: 39 Active Problems ICD-10 Encounter Code Description Active Date MDM Diagnosis L89.154 Pressure ulcer of sacral region, stage 4 06/15/2019 No Yes T81.31XD Disruption of external operation (surgical) wound, not elsewhere classified, 06/15/2019 No Yes subsequent encounter Inactive Problems ICD-10 Code Description Active Date Inactive Date L89.152 Pressure ulcer of sacral region, stage 2 06/15/2019 06/15/2019 M72.6 Necrotizing fasciitis 06/15/2019 06/15/2019 Resolved Problems Electronic Signature(s) Signed: 03/17/2020 5:39:18 PM By: Linton Ham MD Entered By: Linton Ham on 03/17/2020 16:45:50 -------------------------------------------------------------------------------- Progress Note Details Patient Name: Date of Service: [name] Barnett, [name] Barnett 03/17/2020 2:30 PM Medical Record Number: [id number] Patient Account Number: [id number] Date of Birth/Sex: Treating RN: 11/04/1980 (39 y.o. [name] Primary Care Provider: SITA FA LWA LLA, SHA RMIN Other Clinician: Referring Provider: Treating  Provider/Extender: Linton Ham SITA FA LWA LLA, SHA RMIN Weeks in Treatment: 39 Subjective History of Present Illness (HPI) Admission 06/15/2019 This is a 39 year old woman who comes to [place] from [facility] skilled facility in [facility]. I am able to follow a lot of her history on care everywhere in epic. The patient is a type II diabetic and has been on dialysis. 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 I am not exactly sure of the nature of the stroke at this present time. She has made a decent recovery and is now up walking with a walker. In August 2020 she was admitted to [facility] in [place] with L4-L5 discitis. Culture and sensitivity showed Klebsiella. She was discharged on 8 weeks of ceftazidime. In mid September she is readmitted to the hospital with a pressure ulcer on her lower sacrum and necrotizing fasciitis. She required an extensive surgical debridement. CT scan of the pelvis at 1 point showed communication with the rectum. A colostomy was recommended and placed. Culture of this area showed Klebsiella and yeast. She was discharged on an extended course of meropenem and Diflucan. [facility] [place] We do not have any of these records. I think she was discharged to another nursing home but is come to [facility] skilled facility in [facility]. She was sent down here for review of the remaining wound. By review of the pictures that the patient had from September this is made considerable improvement. She tells me that the wound VAC was discontinued at the end of December 2020. Currently the facility is using a form of silver alginate to the wound surface. Past medical history type 2 diabetes with chronic renal failure on dialysis Tuesday  Thursday and Saturday. CVA with left hemiparesis in June 2020 but she appears to be making a good  recovery. Chronic diastolic heart failure, hypertension 2/19; patient's wound area slightly smaller. The area that was towards her anus has closed over. We are using Santyl covered with Hydrofera Blue. We have not been able to get her lab work apparently her veins are very difficult for phlebotomy. They are trying to get the blood work done where she dialyzes in [facility] but that requires some logistical issues. 2/26; not much change in the wound. Still a nonviable surface we have been using Santyl and Hydrofera Blue. I changed her to [facility] only. She tells me she has rigorously offloading this area she also complains of some pain 3/5; still no change here. I switch to pure Hydrofera Blue. She still complains of a lot of pain although is a big wound it seems out of proportion to what I might expect. This was initially a surgical wound secondary to necrotizing wound infection. She is on dialysis. Other than that she thinks she is offloading this fairly rigorously. She has not been systemically unwell 3/26; I switched to Iodoflex 3 weeks ago. This really seems to have helped, much better surface and slight improvement in the surface area. 4/26; the patient is still on Iodoflex she missed an appointment 2 weeks ago. Doing slightly smaller. She is on dialysis 5/10; using Hydrofera Blue. The major improvement here is the complete lack of depth. She has islands of epithelialization 09/24/19-Patient comes in at 2 weeks, dimensions about the same, there is an [place] of epithelium at margin of wound, no slough noted 10/15/2019 upon evaluation today patient appears to be doing well with regard to her wound. She has been tolerating the dressing changes in the sacral region without complication and at this point she seems to be healing quite nicely. I am extremely pleased with how things are progressing and the epithelial tissue seems to be spreading from the central portion as well which is also  excellent. 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 We have been using Hydrofera Blue. The smaller area which has depth at 0.7 cm there is a question about whether they have been packing anything into this or just flaring Hydrofera Blue over the top 9/23; its been more than 2 months since this patient was here. She still has a small area distally which is the remanent of her stage IV wound. They have been using Hydrofera Blue. She also has an area on the left gluteal 10/22; 1 month follow-up. I think the original sacral wound is closed however the patient has 2 areas distally 1 over the coccyx a small pinpoint area and the other into the gluteal cleft. Neither 1 of these looks particularly infected. I thought they might be connected but they were not. 11/15; the original sacral wound is closed patient only has 1 remaining area. 1 of these is in the gluteal cleft just below the coccyx. The area over the coccyx from last time I think is all so closed Objective Constitutional Patient is hypertensive.. Pulse regular and within target range for patient.Marland Kitchen Respirations regular, non-labored and within target range.. Temperature is normal and within the target range for the patient.Marland Kitchen Appears in no distress. Vitals Time Taken: 2:35 PM, Height: 65 in, Weight: 130 lbs, BMI: 21.6, Temperature: 97.7 F, Pulse: 76 bpm, Respiratory Rate: 18 breaths/min, Blood Pressure: 143/84 mmHg, Capillary  Blood Glucose: 117 mg/dl. General Notes: glucose per pt report this am Respiratory work of breathing is normal. Psychiatric appears at normal baseline. General Notes: Wound exam; the patient has 1 remaining wound. This is in the gluteal cleft just below the sacrum. Small wound but with the central part deeper 0.7 cm in overall depth which is an improvement from last time at 1 cm. The area over the tip of the coccyx is closed Integumentary (Hair, Skin) Wound  #3 status is Open. Original cause of wound was Gradually Appeared. The wound is located on the Gluteal fold. The wound measures 1.4cm length x 0.7cm width x 0.7cm depth; 0.77cm^2 area and 0.539cm^3 volume. There is Fat Layer (Subcutaneous Tissue) exposed. There is no tunneling or undermining noted. There is a small amount of serous drainage noted. The wound margin is distinct with the outline attached to the wound base. There is large (67-100%) pink granulation within the wound bed. There is a small (1-33%) amount of necrotic tissue within the wound bed including Adherent Slough. Wound #4 status is Healed - Epithelialized. Original cause of wound was Gradually Appeared. The wound is located on the Coccyx. The wound measures 0cm length x 0cm width x 0cm depth; 0cm^2 area and 0cm^3 volume. There is no tunneling or undermining noted. There is a none present amount of drainage noted. The wound margin is indistinct and nonvisible. There is no granulation within the wound bed. There is no necrotic tissue within the wound bed. Assessment Active Problems ICD-10 Pressure ulcer of sacral region, stage 4 Disruption of external operation (surgical) wound, not elsewhere classified, subsequent encounter Plan Follow-up Appointments: Return appointment in 1 month. - please call clinic to cancel if pt moves to [place] Frequency: Wound #3 Gluteal fold: Change Dressing every other day. Skin Barriers/Peri-Wound Care: Skin Prep Wound Cleansing: Wound #3 Gluteal fold: Clean wound with Wound Cleanser Primary Wound Dressing: Wound #3 Gluteal fold: Calcium Alginate with Silver - lightly pack Secondary Dressing: Wound #3 Gluteal fold: Foam Border - or gauze and tape Off-Loading: Turn and reposition every 2 hours 1. We have been using silver alginate and this and the wound appears to have come in by above 0.3 cm maximal depth therefore look we will continue this 2. I had some thoughts about  collagen however we changed from collagen to silver alginate 2 to 3 weeks ago and the wound is actually improved 3. There would be no ability to get endoform in the nursing home Electronic Signature(s) Signed: 03/17/2020 5:39:18 PM By: Linton Ham MD Entered By: Linton Ham on 03/17/2020 16:48:46 -------------------------------------------------------------------------------- SuperBill Details Patient Name: Date of Service: [name] Barnett, [name] Barnett 03/17/2020 Medical Record Number: [id number] Patient Account Number: [id number] Date of Birth/Sex: Treating RN: 01-16-1981 (39 y.o. [name] Primary Care Provider: SITA FA LWA LLA, SHA RMIN Other Clinician: Referring Provider: Treating Provider/Extender: Linton Ham SITA FA LWA LLA, SHA RMIN Weeks in Treatment: 39 Diagnosis Coding ICD-10 Codes Code Description L89.154 Pressure ulcer of sacral region, stage 4 T81.31XD Disruption of external operation (surgical) wound, not elsewhere classified, subsequent encounter Facility Procedures CPT4 Code: 34193790 Description: 99213 - WOUND CARE VISIT-LEV 3 EST PT Modifier: Quantity: 1 Physician Procedures : CPT4 Code Description Modifier [id number] 99213 - WC PHYS LEVEL 3 - EST PT ICD-10 Diagnosis Description L89.154 Pressure ulcer of sacral region, stage 4 T81.31XD Disruption of external operation (surgical) wound, not elsewhere classified, subsequent  encounter Quantity: 1 Electronic Signature(s) Signed: 03/17/2020 5:39:18 PM By: Linton Ham MD Signed:  03/17/2020 5:51:53 PM By: Levan Hurst RN, BSN Entered By: Levan Hurst on 03/17/2020 17:27:44

## 2020-03-21 ENCOUNTER — Encounter (HOSPITAL_BASED_OUTPATIENT_CLINIC_OR_DEPARTMENT_OTHER): Payer: Medicare Other | Admitting: Internal Medicine

## 2020-03-24 ENCOUNTER — Encounter: Payer: Self-pay | Admitting: Orthopaedic Surgery

## 2020-03-24 ENCOUNTER — Ambulatory Visit (INDEPENDENT_AMBULATORY_CARE_PROVIDER_SITE_OTHER): Payer: Medicare Other | Admitting: Orthopaedic Surgery

## 2020-03-24 ENCOUNTER — Ambulatory Visit (INDEPENDENT_AMBULATORY_CARE_PROVIDER_SITE_OTHER): Payer: Medicare Other

## 2020-03-24 ENCOUNTER — Ambulatory Visit: Payer: Medicare Other | Admitting: Orthopaedic Surgery

## 2020-03-24 DIAGNOSIS — S82891D Other fracture of right lower leg, subsequent encounter for closed fracture with routine healing: Secondary | ICD-10-CM | POA: Diagnosis not present

## 2020-03-24 NOTE — Progress Notes (Signed)
The patient is now over 3 months into a right ankle lateral malleolus fracture.  By the time she got to [place] there was already some interval signs of healing.  She is not a surgical candidate due to peripheral edema combined with being on dialysis and being a diabetic.  We treated this nonoperative.  She has been weightbearing as tolerated and states she is doing better.  She does walk some.  She mainly gets around in a wheelchair.  She states that skilled nursing facility.  She is only 39 years old.  On examination of her right ankle it is clinically well located.  It is in valgus malalignment but there is no soft tissue compromise around her whole ankle on my exam.  There is no instability on exam.  She does wear an ASO.  She does have bilateral peripheral edema.  She reports a hemoglobin A1c of 6.7.  3 views of the right ankle show that the lateral malleolus fracture is healed completely.  The ankle is in valgus malalignment but located.  She can continue her ASO for comfort.  I recommended bilateral lower extremity compressive hose for peripheral edema.  At this point follow-up can be as needed.  She understands that if she develops any issues with that ankle at all we need to see her back.  Her likely definitive treatment for malalignment would be an ankle fusion.  Right now this is not recommended given her low level of physical function and mobility.

## 2020-04-02 ENCOUNTER — Other Ambulatory Visit (HOSPITAL_COMMUNITY)
Admission: RE | Admit: 2020-04-02 | Discharge: 2020-04-02 | Disposition: A | Discharge status: Home / Self Care | Payer: No Typology Code available for payment source | Source: Ambulatory Visit | Attending: Adult Health | Admitting: Adult Health

## 2020-04-02 ENCOUNTER — Telehealth: Payer: Self-pay | Admitting: *Deleted

## 2020-04-02 ENCOUNTER — Ambulatory Visit (INDEPENDENT_AMBULATORY_CARE_PROVIDER_SITE_OTHER): Payer: Medicare Other | Admitting: Adult Health

## 2020-04-02 ENCOUNTER — Encounter: Payer: Self-pay | Admitting: Adult Health

## 2020-04-02 ENCOUNTER — Other Ambulatory Visit: Payer: Self-pay

## 2020-04-02 VITALS — BP 157/98 | HR 86

## 2020-04-02 DIAGNOSIS — B9689 Other specified bacterial agents as the cause of diseases classified elsewhere: Secondary | ICD-10-CM | POA: Diagnosis not present

## 2020-04-02 DIAGNOSIS — N898 Other specified noninflammatory disorders of vagina: Secondary | ICD-10-CM

## 2020-04-02 DIAGNOSIS — Z01419 Encounter for gynecological examination (general) (routine) without abnormal findings: Secondary | ICD-10-CM | POA: Insufficient documentation

## 2020-04-02 DIAGNOSIS — N760 Acute vaginitis: Secondary | ICD-10-CM | POA: Diagnosis not present

## 2020-04-02 LAB — POCT WET PREP (WET MOUNT)
Clue Cells Wet Prep Whiff POC: NEGATIVE
WBC, Wet Prep HPF POC: POSITIVE

## 2020-04-02 MED ORDER — METRONIDAZOLE 500 MG PO TABS: 500.0000 mg | ORAL_TABLET | Freq: Two times a day (BID) | ORAL | 0 refills | Status: DC

## 2020-04-02 NOTE — Telephone Encounter (Signed)
Flagyl 500 mg #14 1 tab BID was called into [facility] on [address]. Left message letting pt know. [place]

## 2020-04-02 NOTE — Progress Notes (Signed)
  Subjective:     Patient ID: [name] Barnett, female   DOB: 08-Sep-1980, 39 y.o.   MRN: [id number]  HPI [name] Barnett is a 39 year old black female,separated, [id number] complaining of vaginal discharge for 2-3 weeks, with odor and itching. She had a Stroke and is at [facility] for rehab. She has colostomy  And she is on dialysis she says. She is in wheel chair today and is a new pt. PCP is at [facility] is Dr [name] Barnett.  Review of Systems +vaginal discharge for 2-3 weeks Has + odor and itching No sex lately Still has monthly periods Reviewed past medical,surgical, social and family history. Reviewed medications and allergies.     Objective:   Physical Exam BP (!) 157/98 (BP Location: Left Arm, Patient Position: Sitting, Cuff Size: Normal)   Pulse 86   Skin warm and dry.Pelvic: external genitalia is normal in appearance no lesions, vagina: creamy mucous discharge without odor,urethra has no lesions or masses noted, cervix:smooth and boulbous, uterus: normal size, shape and contour, non tender, no masses felt, adnexa: no masses or tenderness noted. Bladder is non tender and no masses felt. Wet prep: + for clue cells and +WBCs. CV swab obtained Examination chaperoned by [name] Pupa LPN   AA is 0 Fall risk is low PHQ 9 score is 4, no SI  Upstream - 04/02/20 1035      Pregnancy Intention Screening   Does the patient want to become pregnant in the next year? Unsure    Does the patient's partner want to become pregnant in the next year? N/A    Would the patient like to discuss contraceptive options today? No      Contraception Wrap Up   Current Method Female Sterilization    End Method Female Sterilization    Contraception Counseling Provided No          Assessment:     1. Vaginal discharge CV swab sent for GC/CHL ,trich and BV and yeast   2. Vaginal odor CV swab sent   3. Vaginal itching   4. BV (bacterial vaginosis) Will rx flagyl,no alcohol or sex during treatment , rx  given to pt  Meds ordered this encounter  Medications  . metroNIDAZOLE (FLAGYL) 500 MG tablet    Sig: Take 1 tablet (500 mg total) by mouth 2 (two) times daily.    Dispense:  14 tablet    Refill:  0    Order Specific Question:   Supervising Provider    Answer:   [name] Barnett [[id number]]      Plan:     Follow up prn

## 2020-04-03 ENCOUNTER — Telehealth: Payer: Self-pay | Admitting: *Deleted

## 2020-04-03 LAB — CERVICOVAGINAL ANCILLARY ONLY
Bacterial Vaginitis (gardnerella): NEGATIVE
Candida Glabrata: NEGATIVE
Candida Vaginitis: NEGATIVE
Chlamydia: NEGATIVE
Comment: NEGATIVE
Comment: NEGATIVE
Comment: NEGATIVE
Comment: NEGATIVE
Comment: NEGATIVE
Comment: NORMAL
Neisseria Gonorrhea: NEGATIVE
Trichomonas: NEGATIVE

## 2020-04-03 NOTE — Telephone Encounter (Signed)
-----   Message from Estill Dooms, NP sent at 04/03/2020 12:55 PM EST ----- Let [name] know no STD but finish flagyl

## 2020-04-03 NOTE — Telephone Encounter (Signed)
Pt aware no STD but needs to finish Flagyl. Pt voiced understanding. [place]

## 2020-04-14 ENCOUNTER — Encounter (HOSPITAL_BASED_OUTPATIENT_CLINIC_OR_DEPARTMENT_OTHER): Payer: Medicare Other | Admitting: Internal Medicine

## 2020-07-02 ENCOUNTER — Ambulatory Visit (INDEPENDENT_AMBULATORY_CARE_PROVIDER_SITE_OTHER): Payer: Medicare Other

## 2020-07-02 ENCOUNTER — Ambulatory Visit (INDEPENDENT_AMBULATORY_CARE_PROVIDER_SITE_OTHER): Payer: Medicare Other | Admitting: Orthopaedic Surgery

## 2020-07-02 DIAGNOSIS — M545 Low back pain, unspecified: Secondary | ICD-10-CM | POA: Diagnosis not present

## 2020-07-02 DIAGNOSIS — G8929 Other chronic pain: Secondary | ICD-10-CM | POA: Diagnosis not present

## 2020-07-02 NOTE — Progress Notes (Signed)
Office Visit Note   Patient: [name] Barnett           Date of Birth: 1980/05/22           MRN: [id number] Visit Date: 07/02/2020              Requested by: Julianne Handler, DO [address] [place],  [place] [address] PCP: Julianne Handler, DO   Assessment & Plan: Visit Diagnoses:  1. Chronic low back pain, unspecified back pain laterality, unspecified whether sciatica present     Plan: From a lumbar spine standpoint and from a chronic pain issue as relates to her previous L4-L5 discitis there is nothing I can really recommend.  I can certainly give her a prescription for a lumbar back support brace that she can take to [facility].  We can certainly have Dr. Louanne Skye see her at some point to see if there is any other recommendations he would make for her spine but I am not sure that there would be any further recommendations based on her other comorbidities.  Follow-Up Instructions: Return if symptoms worsen or fail to improve.   Orders:  Orders Placed This Encounter  Procedures  . XR Lumbar Spine 2-3 Views   No orders of the defined types were placed in this encounter.     Procedures: No procedures performed   Clinical Data: No additional findings.   Subjective: Chief Complaint  Patient presents with  . Lower Back - Pain  The patient is referred here with a chronic history of significant low back pain that hurts her mainly at night when she lays down.  She has been recovering from a stroke that occurred in 2020 and stays at the facility.  She has had a sacral wound that is now almost healed she states.  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  Most of her pain is in the lower lumbar spine.  She is wondering if a back support brace could help her.  HPI  Review of Systems Today she denies any fever, chills, nausea, vomiting  Objective: Vital Signs: There were no vitals  taken for this visit.  Physical Exam she is alert and oriented and follows commands appropriately she is in no acute distress Ortho Exam She does mobilize very slowly.  She has weakness in both her lower extremities.  She has significant pain in the lumbar spine to palpation and with flexion extension. Specialty Comments:  No specialty comments available.  Imaging: XR Lumbar Spine 2-3 Views  Result Date: 07/02/2020 2 views of the lumbar spine show severe degenerative changes with chronic discitis between L4 and L5.  There is loss of the lumbar lordosis which is significant.    PMFS History: Patient Active Problem List   Diagnosis Date Noted  . BV (bacterial vaginosis) 04/02/2020  . Vaginal itching 04/02/2020  . Vaginal odor 04/02/2020  . Vaginal discharge 04/02/2020  . Sepsis secondary to UTI ([place]) 01/13/2020  . Acute respiratory failure with hypoxia ([place]) 01/13/2020  . Right leg swelling 01/13/2020  . Sacral decubitus ulcer 01/13/2020  . Anemia due to end stage renal disease ([place]) 01/13/2020  . Essential hypertension 01/13/2020  . Hyperlipidemia 01/13/2020  . Type 2 diabetes mellitus ([place]) 01/13/2020   Past Medical History:  Diagnosis Date  . Diabetes mellitus without complication ([place])   . Hyperlipidemia   . Hypertension   . Renal  disorder   . Stroke ([place])   . Vitamin D deficiency     Family History  Problem Relation Age of Onset  . Cancer Paternal Grandmother   . Heart disease Father   . Hypertension Father   . Diabetes Father   . Glaucoma Father   . Heart disease Mother   . Hypertension Mother   . Diabetes Mother   . Diabetes Sister        borderline  . Hypertension Sister     Past Surgical History:  Procedure Laterality Date  . cardiac stents    . COLOSTOMY    . wound on buttocks     Social History   Occupational History  . Not on file  Tobacco Use  . Smoking status: Never Smoker  . Smokeless tobacco: Never Used  Vaping Use  . Vaping Use: Never  used  Substance and Sexual Activity  . Alcohol use: Never  . Drug use: Never  . Sexual activity: Not Currently    Birth control/protection: Surgical    Comment: tubal

## 2020-07-18 ENCOUNTER — Encounter: Payer: Self-pay | Admitting: Adult Health

## 2020-07-18 ENCOUNTER — Ambulatory Visit (INDEPENDENT_AMBULATORY_CARE_PROVIDER_SITE_OTHER): Payer: Medicare Other | Admitting: Adult Health

## 2020-07-18 ENCOUNTER — Other Ambulatory Visit: Payer: Self-pay

## 2020-07-18 VITALS — BP 113/77 | HR 81

## 2020-07-18 DIAGNOSIS — N760 Acute vaginitis: Secondary | ICD-10-CM | POA: Diagnosis not present

## 2020-07-18 DIAGNOSIS — N898 Other specified noninflammatory disorders of vagina: Secondary | ICD-10-CM

## 2020-07-18 DIAGNOSIS — B9689 Other specified bacterial agents as the cause of diseases classified elsewhere: Secondary | ICD-10-CM

## 2020-07-18 LAB — POCT WET PREP (WET MOUNT): Clue Cells Wet Prep Whiff POC: POSITIVE

## 2020-07-18 MED ORDER — METRONIDAZOLE 500 MG PO TABS: 500.0000 mg | ORAL_TABLET | Freq: Two times a day (BID) | ORAL | 1 refills | Status: DC

## 2020-07-18 NOTE — Progress Notes (Signed)
  Subjective:     Patient ID: [name] Barnett, female   DOB: 09/26/1980, 40 y.o.   MRN: [id number]  HPI [name] Barnett is a 40 year old black female, separated, [id number] in complaining of vaginal discharge with odor at times, similar to what she had in December.  She had a stokr and is at [facility] for rehab.  She is using a wheelchair.  PCP is Dr Carlis Abbott.  Review of Systems +vaginal discharge with odor Not having sex Reviewed past medical,surgical, social and family history. Reviewed medications and allergies.     Objective:   Physical Exam BP 113/77 (BP Location: Left Arm, Patient Position: Sitting, Cuff Size: Normal)   Pulse 81 Skin warm and dry.Pelvic: external genitalia is normal in appearance no lesions, vagina: brownish discharge with odor,urethra has no lesions or masses noted, cervix:smooth, uterus: normal size, shape and contour, non tender, no masses felt, adnexa: no masses or tenderness noted. Bladder is non tender and no masses felt. Wet prep: + for clue cells and +WBCs. Examination chaperoned by [name] Pupa LPN Fall risk is low  [facility] - 07/18/20 1221      Pregnancy Intention Screening   Does the patient want to become pregnant in the next year? No    Does the patient's partner want to become pregnant in the next year? No    Would the patient like to discuss contraceptive options today? No      Contraception Wrap Up   Current Method Female Sterilization    End Method Female Sterilization    Contraception Counseling Provided No             Assessment:     1. Vaginal discharge  2. Vaginal odor   3. BV (bacterial vaginosis) Will rx flagyl Meds ordered this encounter  Medications  . metroNIDAZOLE (FLAGYL) 500 MG tablet    Sig: Take 1 tablet (500 mg total) by mouth 2 (two) times daily.    Dispense:  14 tablet    Refill:  1    Order Specific Question:   Supervising Provider    Answer:   Florian Buff [[id number]]      Plan:     Follow up prn

## 2020-08-13 ENCOUNTER — Other Ambulatory Visit: Payer: Self-pay

## 2020-08-13 ENCOUNTER — Ambulatory Visit (INDEPENDENT_AMBULATORY_CARE_PROVIDER_SITE_OTHER): Payer: Medicare Other | Admitting: Specialist

## 2020-08-13 ENCOUNTER — Ambulatory Visit (INDEPENDENT_AMBULATORY_CARE_PROVIDER_SITE_OTHER): Payer: Medicare Other

## 2020-08-13 ENCOUNTER — Encounter: Payer: Self-pay | Admitting: Specialist

## 2020-08-13 VITALS — BP 108/76 | HR 80 | Ht 65.0 in | Wt 166.0 lb

## 2020-08-13 DIAGNOSIS — M4649 Discitis, unspecified, multiple sites in spine: Secondary | ICD-10-CM | POA: Diagnosis not present

## 2020-08-13 DIAGNOSIS — I69354 Hemiplegia and hemiparesis following cerebral infarction affecting left non-dominant side: Secondary | ICD-10-CM | POA: Diagnosis not present

## 2020-08-13 DIAGNOSIS — R29898 Other symptoms and signs involving the musculoskeletal system: Secondary | ICD-10-CM | POA: Diagnosis not present

## 2020-08-13 DIAGNOSIS — M5136 Other intervertebral disc degeneration, lumbar region: Secondary | ICD-10-CM

## 2020-08-13 DIAGNOSIS — G8929 Other chronic pain: Secondary | ICD-10-CM

## 2020-08-13 DIAGNOSIS — M545 Low back pain, unspecified: Secondary | ICD-10-CM

## 2020-08-13 NOTE — Progress Notes (Signed)
Office Visit Note   Patient: [name] Barnett           Date of Birth: 05/30/1980           MRN: [id number] Visit Date: 08/13/2020              Requested by: Julianne Handler, DO [address] [place],  [place] [address] PCP: Julianne Handler, DO   Assessment & Plan: Visit Diagnoses:  1. Chronic low back pain, unspecified back pain laterality, unspecified whether sciatica present   2. Leg weakness, bilateral   3. Hemiparesis affecting left side as late effect of cerebrovascular accident (CVA) ([place])   4. Discitis of multiple sites of spine   5. Degenerative disc disease, lumbar     Plan: Avoid frequent bending and stooping  No lifting greater than 10 lbs. May use ice or moist heat for pain. Weight loss is of benefit. Likely would see improvement in her independence with the use of a personal mobility motorized wheel chair or Reclining w/c to relieve the stress in the lumbar disc area by reclining. This would also improve her ability to Ambulate distances greater than 1 1/2 football fields and relieve stress on the lumbar discs.  Exercise is important to improve your indurance and does allow people to function better inspite of back pain. Patient requests an upright walker and I have written a Rx for this. Needs lab done at her next dialysis including blood culltures, sed rate, CRP CBC with differential to assess for persistent infection in the lumbar spine.    Follow-Up Instructions: Return in about 4 weeks (around 09/10/2020).   Orders:  Orders Placed This Encounter  Procedures  . Walker rolling  . XR Lumb Spine Flex&Ext Only  . MR Lumbar Spine w/o contrast   No orders of the defined types were placed in this encounter.     Procedures: No procedures performed   Clinical Data: No additional findings.   Subjective: Chief Complaint  Patient presents with  . Lower Back - Pain    40 year old female with history of stroke in 10/2018, she has had  diabetes and post stroke has had bilateral lower extremity weakness, now she uses a glider when she is standing and walking and can now walk nearly  480 feet. She has been hospitalized in the pain in 01/2019 with sepsis from infected sacral decubitus and this was treated. She has had diabetes about 18 years. She had been married and now is separated about 3 years. She has three, 27, 25 and 42 years of age. She is having pain into the lumbar spine at the lumbosacral area above the sacral decubitus areas. History of drainage of an abscess in the upper thoracic area that was  Treated by surgeons in [place] 12 years ago with a wound vac this healed. The sacral area was treated by surgeons in [place] and she had the sacral area healed with vac, now with daily dressings as the decubitus is nearly healed. Back pain with radiation into the left posterior thigh and knee and down into the left foot, whole left foot. Pain in the back and left is not worse with standing and walking. The pain is worse with sitting, reaching and bending. Riding for 2-3 hours is painful. Bowel regimine, blue pill dulcolax and miralax. Bladder is with voiding about twice a week as she goes to dialysis and has chronic renal insufficiency.     Review of Systems  Constitutional: Positive for  diaphoresis (Has night sweats and is 39 with normal periods), fatigue (COVID in January, 2022) and fever. Negative for chills.  HENT: Positive for congestion, rhinorrhea, sinus pressure, sinus pain, sneezing, sore throat and trouble swallowing. Negative for ear discharge, ear pain, facial swelling, hearing loss, mouth sores, nosebleeds, postnasal drip, tinnitus and voice change.   Eyes: Positive for visual disturbance (Left eye cataract to be opened on 5/13 surgery planned.). Negative for photophobia, pain, discharge, redness and itching.  Respiratory: Positive for chest tightness and shortness of breath. Negative for apnea, cough, choking, wheezing  and stridor.   Cardiovascular: Positive for leg swelling (legs swell as the day goes on). Negative for chest pain and palpitations.  Gastrointestinal: Positive for abdominal pain, constipation, diarrhea and nausea. Negative for abdominal distention, anal bleeding, blood in stool, rectal pain and vomiting.  Endocrine: Positive for cold intolerance (not tolerant of cold goes to her bones). Negative for heat intolerance, polydipsia, polyphagia and polyuria.  Genitourinary: Positive for difficulty urinating (Currently on dialysis since Nov. 2020).  Musculoskeletal: Positive for back pain, gait problem, neck pain (intermittantly) and neck stiffness. Negative for arthralgias.  Skin: Positive for wound (Sacral decubitus is nearly healed). Negative for rash.  Allergic/Immunologic: Positive for environmental allergies and food allergies (Corn, frozen and canned). Negative for immunocompromised state.  Neurological: Positive for dizziness, weakness, light-headedness, numbness (Legs cramp and feel asleep) and headaches. Negative for tremors, seizures, syncope and speech difficulty (with the stroke origninally, affected left arm more than right and left leg more than the right. ).  Hematological: Negative.  Negative for adenopathy. Does not bruise/bleed easily.  Psychiatric/Behavioral: Positive for sleep disturbance. Negative for agitation, behavioral problems, confusion, decreased concentration, dysphoric mood, hallucinations, self-injury and suicidal ideas. The patient is nervous/anxious. The patient is not hyperactive.      Objective: Vital Signs: BP 108/76 (BP Location: Left Arm, Patient Position: Sitting)   Pulse 80   Ht '5\' 5"'$  (1.651 m)   Wt 166 lb (75.3 kg)   BMI 27.62 kg/m   Physical Exam Vitals reviewed.  Constitutional:      General: She is not in acute distress.    Appearance: Normal appearance. She is normal weight. She is not ill-appearing, toxic-appearing or diaphoretic.  HENT:     Head:  Atraumatic.     Nose: No congestion or rhinorrhea.     Mouth/Throat:     Mouth: Mucous membranes are dry.  Eyes:     General:        Right eye: No discharge.        Left eye: No discharge.     Extraocular Movements: Extraocular movements intact.  Cardiovascular:     Rate and Rhythm: Normal rate and regular rhythm.     Pulses: Normal pulses.     Heart sounds: Normal heart sounds.  Pulmonary:     Effort: Pulmonary effort is normal.     Breath sounds: Normal breath sounds.  Abdominal:     General: Bowel sounds are normal.     Palpations: Abdomen is soft.  Musculoskeletal:        General: Swelling present.     Cervical back: Neck supple.  Psychiatric:        Mood and Affect: Mood normal.        Behavior: Behavior normal.        Thought Content: Thought content normal.        Judgment: Judgment normal.     Ortho Exam  Specialty Comments:  No specialty comments  available.  Imaging: XR Lumb Spine Flex&Ext Only  Result Date: 08/13/2020 Ap and lateral flexion and extension radiographs of the lumbar spine demonstrate lytic changes with disc space narrowing worse at the L4-5 level. Mild to moderate disc narrowing with endplate lytic changes at L5-S1 and disc narrowing at L3-4. There is callus over the anterior L4-5 disc level. Findings consistent with sequelae of discitis likely infectious L3-4, L4-5 and L5-S1. Changes are seen on previous CT of the lumbar spine dating back to 03/2019.    PMFS History: Patient Active Problem List   Diagnosis Date Noted  . BV (bacterial vaginosis) 04/02/2020  . Vaginal itching 04/02/2020  . Vaginal odor 04/02/2020  . Vaginal discharge 04/02/2020  . Sepsis secondary to UTI ([place]) 01/13/2020  . Acute respiratory failure with hypoxia ([place]) 01/13/2020  . Right leg swelling 01/13/2020  . Sacral decubitus ulcer 01/13/2020  . Anemia due to end stage renal disease ([place]) 01/13/2020  . Essential hypertension 01/13/2020  . Hyperlipidemia 01/13/2020  .  Type 2 diabetes mellitus ([place]) 01/13/2020   Past Medical History:  Diagnosis Date  . Diabetes mellitus without complication ([place])   . Hyperlipidemia   . Hypertension   . Renal disorder   . Stroke ([place])   . Vitamin D deficiency     Family History  Problem Relation Age of Onset  . Cancer Paternal Grandmother   . Heart disease Father   . Hypertension Father   . Diabetes Father   . Glaucoma Father   . Heart disease Mother   . Hypertension Mother   . Diabetes Mother   . Diabetes Sister        borderline  . Hypertension Sister     Past Surgical History:  Procedure Laterality Date  . cardiac stents    . COLOSTOMY    . wound on buttocks     Social History   Occupational History  . Not on file  Tobacco Use  . Smoking status: Never Smoker  . Smokeless tobacco: Never Used  Vaping Use  . Vaping Use: Never used  Substance and Sexual Activity  . Alcohol use: Never  . Drug use: Never  . Sexual activity: Not Currently    Birth control/protection: Surgical    Comment: tubal

## 2020-08-13 NOTE — Patient Instructions (Signed)
Avoid frequent bending and stooping  No lifting greater than 10 lbs. May use ice or moist heat for pain. Weight loss is of benefit. Likely would see improvement in her independence with the use of a personal mobility motorized wheel chair or Reclining w/c to relieve the stress in the lumbar disc area by reclining. This would also improve her ability to Ambulate distances greater than 1 1/2 football fields and relieve stress on the lumbar discs.  Exercise is important to improve your indurance and does allow people to function better inspite of back pain. Patient requests an upright walker and I have written a Rx for this. Needs lab done at her next dialysis including blood culltures, sed rate, CRP CBC with differential to assess for persistent infection in the lumbar spine.

## 2020-08-14 LAB — TSH: TSH: 0.2 — AB (ref 0.41–5.90)

## 2020-08-20 NOTE — H&P (Signed)
Surgical History & Physical  Patient Name: [name] Barnett DOB: July 26, 1980  Surgery: Cataract extraction with intraocular lens implant phacoemulsification; Left Eye  Surgeon: Baruch Goldmann MD Surgery Date:  08/29/2020 Pre-Op Date:  08/04/2020  HPI: A 65 Yr. old female patient is referred by Dr Wynetta Emery for cataract eval. 1. The patient complains of difficulty when viewing TV, reading closed caption, news scrolls on TV, which began 1 year ago. The left eye is affected. The episode is gradual. The condition's severity increased since last visit. Symptoms occur when the patient is inside and outside. This is negatively affecting her quality of life. HPI Completed by Dr. Baruch Goldmann  Medical History: Cataracts Depressive disorder, Anxiety,Gastroesophageal refl... Diabetes Heart Problem Thyroid Problems  Review of Systems Negative Allergic/Immunologic Negative Cardiovascular Negative Constitutional Negative Ear, Nose, Mouth & Throat Negative Endocrine Negative Eyes Negative Gastrointestinal Negative Genitourinary Negative Hemotologic/Lymphatic Negative Integumentary Negative Musculoskeletal Negative Neurological Negative Psychiatry Negative Respiratory  Social   Never smoked    Medication Amiloride-HCTZ, Atorvastatin, Glycolax, Hydrocodone, Juven, Nephro-vite, Ropinrole hcl, Senna, Zanaflex, Lantus, Subcutaneous, NovoLog, Subcutaneous, NovoLog, Subcutaneous, Bentyl, Pantoprazole,   Sx/Procedures Retina repair,  Colectomy, Stent in heart x 2, Knee, Wrist Sx,   Drug Allergies  Lisinopril, Morphine,   History & Physical: Heent:  Cataract, Left eye NECK: supple without bruits LUNGS: lungs clear to auscultation CV: regular rate and rhythm Abdomen: soft and non-tender  Impression & Plan: Assessment: 1.  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  COMBINED FORMS AGE RELATED CATARACT; Right Eye (H25.811) 3.  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  Cataract accounts for the patient's decreased vision. This visual impairment is not correctable with a tolerable change in glasses or contact lenses. Cataract surgery with an implantation of a new lens should significantly improve the visual and functional status of the patient. Discussed all risks, benefits, alternatives, and potential complications. Discussed the procedures and recovery. Patient desires to have surgery. A-scan ordered and performed today for intra-ocular lens calculations. The surgery will be performed in order to improve vision for driving, reading, and for eye examinations. Recommend phacoemulsification with intra-ocular lens. Recommend Dextenza for post-operative pain and inflammation. Left Eye. Dilates poorly - shugacaine by protocol. [facility] [place]. Malyugin Ring. [place]mid[place]. S/P Vitrectomy.  2.  Cataract accounts for the patient's decreased vision. Visually significant - will address after left eye.  3.  Surgical repair with scarring OU. Under care of Dr. Baird Cancer - appreciate his assistance with this very pleasant patient.

## 2020-08-21 NOTE — Patient Instructions (Signed)
[name] Barnett  08/21/2020     '@PREFPERIOPPHARMACY'$ @   Your procedure is scheduled on  08/29/2020.   Report to [facility] at   1100  A.M.      You need someone to stay with your while we wait on your COVID results.   Call this number if you have problems the morning of surgery:  [phone]   Remember:  Do not eat or drink after midnight.                        Take these medicines the morning of surgery with A SIP OF WATER  Amlodipine, atenolol, protonix, tramadol(if needed).  DO NOT take any medications for diabetes the morning of your procedure.  If your glucose is 70 or below the morning of your procedure, drink 1/2 of clear liquid, that contains sugar and recheck your glucose in 15 minutes. If your glucose is still 70 or below, call [phone] for instructions.  If your glucose is 300 or above the morning of your procedure, call [phone] for instructions.     Please brush your teeth.  Do not wear jewelry, make-up or nail polish.  Do not wear lotions, powders, or perfumes, or deodorant.  Do not shave 48 hours prior to surgery.  Men may shave face and neck.  Do not bring valuables to the hospital.  [facility] is not responsible for any belongings or valuables.  Contacts, dentures or bridgework may not be worn into surgery.  Leave your suitcase in the car.  After surgery it may be brought to your room.  For patients admitted to the hospital, discharge time will be determined by your treatment team.  Patients discharged the day of surgery will not be allowed to drive home and must have someone with them for 24 hours.   Special instructions:  DO NOT smoke tobacco or vape for 24 hours before your procedure.  Please read over the following fact sheets that you were given. Anesthesia Post-op Instructions and Care and Recovery After Surgery      Cataract Surgery, Care After This sheet gives you information about how to care for yourself after your  procedure. Your health care provider may also give you more specific instructions. If you have problems or questions, contact your health care provider. What can I expect after the procedure? After the procedure, it is common to have:  Itching.  Discomfort.  Fluid discharge.  Sensitivity to light and to touch.  Bruising in or around the eye.  Mild blurred vision. Follow these instructions at home: Eye care  Do not touch or rub your eyes.  Protect your eyes as told by your health care provider. You may be told to wear a protective eye shield or sunglasses.  Do not put a contact lens into the affected eye or eyes until your health care provider approves.  Keep the area around your eye clean and dry: ? Avoid swimming. ? Do not allow water to hit you directly in the face while showering. ? Keep soap and shampoo out of your eyes.  Check your eye every day for signs of infection. Watch for: ? Redness, swelling, or pain. ? Fluid, blood, or pus. ? Warmth. ? A bad smell. ? Vision that is getting worse. ? Sensitivity that is getting worse.   Activity  Do not drive for 24 hours if you were given a sedative during your procedure.  Avoid strenuous activities,  such as playing contact sports, for as long as told by your health care provider.  Do not drive or use heavy machinery until your health care provider approves.  Do not bend or lift heavy objects. Bending increases pressure in the eye. You can walk, climb stairs, and do light household chores.  Ask your health care provider when you can return to work. If you work in a dusty environment, you may be advised to wear protective eyewear for a period of time. General instructions  Take or apply over-the-counter and prescription medicines only as told by your health care provider. This includes eye drops.  Keep all follow-up visits as told by your health care provider. This is important. Contact a health care provider if:  You  have increased bruising around your eye.  You have pain that is not helped with medicine.  You have a fever.  You have redness, swelling, or pain in your eye.  You have fluid, blood, or pus coming from your incision.  Your vision gets worse.  Your sensitivity to light gets worse. Get help right away if:  You have sudden loss of vision.  You see flashes of light or spots (floaters).  You have severe eye pain.  You develop nausea or vomiting. Summary  After your procedure, it is common to have itching, discomfort, bruising, fluid discharge, or sensitivity to light.  Follow instructions from your health care provider about caring for your eye after the procedure.  Do not rub your eye after the procedure. You may need to wear eye protection or sunglasses. Do not wear contact lenses. Keep the area around your eye clean and dry.  Avoid activities that require a lot of effort. These include playing sports and lifting heavy objects.  Contact a health care provider if you have increased bruising, pain that does not go away, or a fever. Get help right away if you suddenly lose your vision, see flashes of light or spots, or have severe pain in the eye. This information is not intended to replace advice given to you by your health care provider. Make sure you discuss any questions you have with your health care provider. Document Revised: 02/13/2019 Document Reviewed: 10/17/2017 Elsevier Patient Education  [place].

## 2020-08-25 ENCOUNTER — Encounter (HOSPITAL_COMMUNITY)
Admission: RE | Admit: 2020-08-25 | Discharge: 2020-08-25 | Disposition: A | Discharge status: Home / Self Care | Payer: Medicare Other | Source: Ambulatory Visit | Attending: Ophthalmology | Admitting: Ophthalmology

## 2020-08-25 ENCOUNTER — Other Ambulatory Visit: Payer: Self-pay

## 2020-08-27 ENCOUNTER — Other Ambulatory Visit: Payer: Self-pay

## 2020-08-27 ENCOUNTER — Encounter: Payer: Self-pay | Admitting: Nurse Practitioner

## 2020-08-27 ENCOUNTER — Ambulatory Visit (INDEPENDENT_AMBULATORY_CARE_PROVIDER_SITE_OTHER): Payer: Medicare Other | Admitting: Nurse Practitioner

## 2020-08-27 VITALS — BP 125/84 | HR 80 | Wt 166.0 lb

## 2020-08-27 DIAGNOSIS — R7989 Other specified abnormal findings of blood chemistry: Secondary | ICD-10-CM

## 2020-08-27 DIAGNOSIS — E0590 Thyrotoxicosis, unspecified without thyrotoxic crisis or storm: Secondary | ICD-10-CM

## 2020-08-27 NOTE — Patient Instructions (Signed)

## 2020-08-27 NOTE — Progress Notes (Addendum)
08/27/2020     Endocrinology Consult Note    Subjective:    Patient ID: [name] Barnett, female    DOB: [birth year], PCP [name] Barnett.   Past Medical History:  Diagnosis Date  . Diabetes mellitus without complication ([place])   . Hyperlipidemia   . Hypertension   . Renal disorder   . Stroke ([place])   . Vitamin D deficiency     Past Surgical History:  Procedure Laterality Date  . cardiac stents    . COLOSTOMY    . wound on buttocks      Social History   Socioeconomic History  . Marital status: Legally Separated    Spouse name: Not on file  . Number of children: Not on file  . Years of education: Not on file  . Highest education level: Not on file  Occupational History  . Not on file  Tobacco Use  . Smoking status: Never Smoker  . Smokeless tobacco: Never Used  Vaping Use  . Vaping Use: Never used  Substance and Sexual Activity  . Alcohol use: Never  . Drug use: Never  . Sexual activity: Not Currently    Birth control/protection: Surgical    Comment: tubal  Other Topics Concern  . Not on file  Social History Narrative  . Not on file   Social Determinants of Health   Financial Resource Strain: Low Risk   . Difficulty of Paying Living Expenses: Not very hard  Food Insecurity: No Food Insecurity  . Worried About Charity fundraiser in the Last Year: Never true  . Ran Out of Food in the Last Year: Never true  Transportation Needs: No Transportation Needs  . Lack of Transportation (Medical): No  . Lack of Transportation (Non-Medical): No  Physical Activity: Insufficiently Active  . Days of Exercise per Week: 3 days  . Minutes of Exercise per Session: 30 min  Stress: No Stress Concern Present  . Feeling of Stress : Not at all  Social Connections: Moderately Isolated  . Frequency of Communication with Friends and Family: More than three times a week  . Frequency of Social Gatherings with Friends and Family: Never  . Attends  Religious Services: More than 4 times per year  . Active Member of Clubs or Organizations: No  . Attends Archivist Meetings: Never  . Marital Status: Separated    Family History  Problem Relation Age of Onset  . Cancer Paternal Grandmother   . Heart disease Father   . Hypertension Father   . Diabetes Father   . Glaucoma Father   . Heart disease Mother   . Hypertension Mother   . Diabetes Mother   . Diabetes Sister        borderline  . Hypertension Sister     Outpatient Encounter Medications as of 08/27/2020  Medication Sig  . acetaminophen (TYLENOL) 325 MG tablet Take 650 mg by mouth every 6 (six) hours as needed.  Marland Kitchen amLODipine (NORVASC) 5 MG tablet Take 5 mg by mouth See admin instructions. Sun, Mon, Wed and Fri  . ascorbic acid (VITAMIN C) 500 MG tablet Take 500 mg by mouth 2 (two) times daily.  Marland Kitchen aspirin EC 81 MG tablet Take 81 mg by mouth daily. Swallow whole.  Marland Kitchen atenolol (TENORMIN) 25 MG tablet Take 0.5 tablets (12.5 mg total) by mouth daily. (Patient taking differently: Take 12.5 mg by mouth See admin instructions. Every Sun, Mon, Wed and Fri)  . atorvastatin (LIPITOR)  80 MG tablet Take 1 tablet by mouth at bedtime.  Marland Kitchen b complex-vitamin c-folic acid (NEPHRO-VITE) 0.8 MG TABS tablet Take 1 tablet by mouth daily.  . Biotin 5 MG TABS Take 5 mg by mouth in the morning and at bedtime.  . calcium carbonate (TUMS - DOSED IN MG ELEMENTAL CALCIUM) 500 MG chewable tablet Chew 1,000 mg by mouth 3 (three) times daily. Before meals  . cetaphil (CETAPHIL) lotion Apply 1 application topically 2 (two) times daily as needed for dry skin.  . Cholecalciferol (VITAMIN D) 50 MCG (2000 UT) CAPS Take 2,000 Units by mouth daily.  Marland Kitchen dicyclomine (BENTYL) 10 MG capsule Take 10 mg by mouth 3 (three) times daily before meals.  . insulin glargine (LANTUS) 100 UNIT/ML Solostar Pen Inject 10 Units into the skin in the morning.   . insulin lispro (HUMALOG) 100 UNIT/ML injection Inject 5 Units into  the skin 3 (three) times daily before meals.  . Lidocaine 4 % PTCH Apply 1 patch topically daily. Remove at 2100  . lidocaine-prilocaine (EMLA) cream Apply 1 application topically daily as needed. Apply to HD graft site right arm topically one every tues, thurs, sat for pain.  . melatonin 3 MG TABS tablet Take 3 mg by mouth at bedtime.  . Multiple Vitamins-Minerals (CERTAGEN PO) Take 1 tablet by mouth in the morning.  . pantoprazole (PROTONIX) 40 MG tablet Take 40 mg by mouth in the morning.  . polyethylene glycol (MIRALAX / GLYCOLAX) 17 g packet Take 17 g by mouth daily.  Marland Kitchen rOPINIRole (REQUIP) 0.5 MG tablet Take 0.5 mg by mouth at bedtime.  Marland Kitchen rOPINIRole (REQUIP) 2 MG tablet Take 2 mg by mouth at bedtime.  . sennosides-docusate sodium (SENOKOT-S) 8.6-50 MG tablet Take 2 tablets by mouth daily.  Marland Kitchen thiamine 100 MG tablet Take 100 mg by mouth daily.  Marland Kitchen tiZANidine (ZANAFLEX) 4 MG tablet Take 4 mg by mouth at bedtime.  . traMADol (ULTRAM) 50 MG tablet Take 100 mg by mouth every 6 (six) hours as needed for severe pain.  . [DISCONTINUED] metroNIDAZOLE (FLAGYL) 500 MG tablet Take 1 tablet (500 mg total) by mouth 2 (two) times daily. (Patient not taking: No sig reported)   No facility-administered encounter medications on file as of 08/27/2020.    ALLERGIES: Allergies  Allergen Reactions  . Corn-Containing Products     Per MAR  . Lisinopril     Per MAR  . Morphine And Related     Per MAR    VACCINATION STATUS:  There is no immunization history on file for this patient.   HPI  [name] Barnett is 40 y.o. female who presents today with a medical history as above. she is being seen in consultation for hyperthyroidism requested by [name] Barnett.  she has been dealing with symptoms of intermittent palpitations, tremors, insomnia, and intermittent diarrhea for a few months now. These symptoms are progressively worsening and troubling to her.  her most recent thyroid labs revealed  suppressed TSH of 0.198, T4 of 9.4, T3 uptake of 46, and Free Thyroxine Index of 4.3 on 08/14/20. she denies dysphagia, choking, shortness of breath, no recent voice change.    she reports family history of thyroid dysfunction in her mother (unsure of what kind), but denies family hx of thyroid cancer. she does have personal history of goiter. she is not on any anti-thyroid medications nor on any thyroid hormone supplements. Denies use of Biotin containing supplements (used to take it years ago but has  not since).  she is willing to proceed with appropriate work up and therapy for thyrotoxicosis.   Review of systems  Constitutional: + Minimally fluctuating body weight, current Body mass index is 27.62 kg/m., no fatigue, no subjective hyperthermia, no subjective hypothermia Eyes: + blurry vision (having cataract surgery soon bilaterally), no xerophthalmia ENT: no sore throat, no nodules palpated in throat, no dysphagia/odynophagia, no hoarseness Cardiovascular: no chest pain, no shortness of breath, intermittent palpitations, + leg swelling Respiratory: no cough, no shortness of breath Gastrointestinal: no nausea/vomiting, intermittent diarrhea  Musculoskeletal: no muscle/joint aches, wc bound due to previous stroke Skin: no rashes, no hyperemia Neurological: mild intermittent tremors, no numbness, no tingling, no dizziness Psychiatric: no depression, no anxiety, insomnia   Objective:    BP 125/84 (BP Location: Left Arm, Patient Position: Sitting)   Pulse 80   Wt 166 lb (75.3 kg)   BMI 27.62 kg/m   Wt Readings from Last 3 Encounters:  08/27/20 166 lb (75.3 kg)  08/13/20 166 lb (75.3 kg)  01/14/20 163 lb 2.3 oz (74 kg)     BP Readings from Last 3 Encounters:  08/27/20 125/84  08/13/20 108/76  07/18/20 113/77                        Physical Exam- Limited  Constitutional:  Body mass index is 27.62 kg/m. , not in acute distress, normal state of mind Eyes:  EOMI, no  exophthalmos Neck: Supple Thyroid: + gross goiter Cardiovascular: RRR, no murmers, rubs, or gallops, 2+ pitting edema to BLE Respiratory: Adequate breathing efforts, no crackles, rales, rhonchi, or wheezing Musculoskeletal: no gross deformities, strength intact in all four extremities, no gross restriction of joint movements, in wc due to previous CVA Skin:  no rashes, no hyperemia Neurological: no tremor with outstretched hands   CMP     Component Value Date/Time   NA 137 01/16/2020 0617   K 3.8 01/16/2020 0617   CL 95 (L) 01/16/2020 0617   CO2 24 01/16/2020 0617   GLUCOSE 98 01/16/2020 0617   BUN 62 (H) 01/16/2020 0617   CREATININE 7.28 (H) 01/16/2020 0617   CALCIUM 8.6 (L) 01/16/2020 0617   PROT 6.9 01/14/2020 1016   ALBUMIN 3.0 (L) 01/14/2020 1016   AST 13 (L) 01/14/2020 1016   ALT 38 01/14/2020 1016   ALKPHOS 97 01/14/2020 1016   BILITOT 0.6 01/14/2020 1016   GFRNONAA 6 (L) 01/16/2020 0617   GFRAA 7 (L) 01/16/2020 0617     CBC    Component Value Date/Time   WBC 6.2 01/16/2020 0617   RBC 2.85 (L) 01/16/2020 0617   HGB 8.3 (L) 01/16/2020 0617   HCT 26.7 (L) 01/16/2020 0617   PLT 259 01/16/2020 0617   MCV 93.7 01/16/2020 0617   MCH 29.1 01/16/2020 0617   MCHC 31.1 01/16/2020 0617   RDW 14.9 01/16/2020 0617   LYMPHSABS 0.8 01/13/2020 0219   MONOABS 0.5 01/13/2020 0219   EOSABS 0.0 01/13/2020 0219   BASOSABS 0.1 01/13/2020 0219     Diabetic Labs (most recent): Lab Results  Component Value Date   HGBA1C 7.1 (H) 01/13/2020   HGBA1C 6.2 (H) 02/08/2019    Lipid Panel  No results found for: CHOL, TRIG, HDL, CHOLHDL, VLDL, LDLCALC, LDLDIRECT, LABVLDL   Lab Results  Component Value Date   TSH 0.20 (A) 08/14/2020        Assessment & Plan:   1. Abnormal TSH  she is being seen at a  kind request of [name] Barnett.  her history and most recent labs are reviewed, and she was examined clinically. Subjective and objective findings are consistent  with thyrotoxicosis likely from primary hyperthyroidism. The potential risks of untreated thyrotoxicosis and the need for definitive therapy have been discussed in detail with her, and she agrees to proceed with diagnostic workup and treatment plan.   I will repeat full profile thyroid function tests tomorrow (when she goes for hemodialysis) and confirmatory thyroid uptake and scan will be scheduled to be done as soon as possible.   Options of therapy are discussed with her.  We discussed the option of treating it with medications including methimazole or PTU which may have side effects including rash, transaminitis, and bone marrow suppression.  We also discussed the option of definitive therapy with RAI ablation of the thyroid. If she is found to have primary hyperthyroidism from Graves' disease , toxic multinodular goiter or toxic nodular goiter the preferred modality of treatment would be I-131 thyroid ablation. Surgery is another choice of treatment in some cases, in her case surgery is not a good fit for presentation with only mild goiter.  -Patient is made aware of the high likelihood of post ablative hypothyroidism with subsequent need for lifelong thyroid hormone replacement. sheunderstands this outcome and she is  willing to proceed.      she will return in 2 weeks for treatment decision.   I did not initiate any new prescriptions at today's appointment.  She is already on beta-blocker which may help with her symptoms.    -Patient is advised to maintain close follow up with [place], [place], [facility]ett for primary care needs.   - Time spent with the patient: 60 minutes, of which >50% was spent in obtaining information about her symptoms, reviewing her previous labs, evaluations, and treatments, counseling her about her hyperthyroidism , and developing a plan to confirm the diagnosis and long term treatment as necessary. Please refer to "Patient Self Inventory" in the Media tab for reviewed  elements of pertinent patient history.  [place] participated in the discussions, expressed understanding, and voiced agreement with the above plans.  All questions were answered to her satisfaction. she is encouraged to contact clinic should she have any questions or concerns prior to her return visit.   Follow up plan: Return in about 2 weeks (around 09/10/2020) for Thyroid follow up, Previsit labs, uptake and scan.   Thank you for involving me in the care of this pleasant patient, and I will continue to update you with her progress.  Rayetta Pigg, [facility] [address] [place], [place] [address] Phone: [phone] Fax: [phone]  08/27/2020, 11:25 AM

## 2020-08-29 ENCOUNTER — Ambulatory Visit (HOSPITAL_COMMUNITY)
Admission: RE | Admit: 2020-08-29 | Discharge: 2020-08-29 | Disposition: A | Discharge status: Home / Self Care | Payer: Medicare Other | Attending: Ophthalmology | Admitting: Ophthalmology

## 2020-08-29 ENCOUNTER — Ambulatory Visit (HOSPITAL_COMMUNITY): Payer: Medicare Other | Admitting: Anesthesiology

## 2020-08-29 ENCOUNTER — Encounter (HOSPITAL_COMMUNITY): Payer: Self-pay | Admitting: Ophthalmology

## 2020-08-29 ENCOUNTER — Encounter (HOSPITAL_COMMUNITY)
Admission: RE | Disposition: A | Discharge status: Home / Self Care | Payer: Self-pay | Source: Home / Self Care | Attending: Ophthalmology

## 2020-08-29 ENCOUNTER — Other Ambulatory Visit (HOSPITAL_COMMUNITY)
Admission: RE | Admit: 2020-08-29 | Discharge: 2020-08-29 | Disposition: A | Discharge status: Home / Self Care | Payer: Medicare Other | Source: Ambulatory Visit | Attending: Ophthalmology | Admitting: Ophthalmology

## 2020-08-29 DIAGNOSIS — H2181 Floppy iris syndrome: Secondary | ICD-10-CM | POA: Diagnosis not present

## 2020-08-29 DIAGNOSIS — H25811 Combined forms of age-related cataract, right eye: Secondary | ICD-10-CM | POA: Insufficient documentation

## 2020-08-29 DIAGNOSIS — Z955 Presence of coronary angioplasty implant and graft: Secondary | ICD-10-CM | POA: Diagnosis not present

## 2020-08-29 DIAGNOSIS — Z794 Long term (current) use of insulin: Secondary | ICD-10-CM | POA: Diagnosis not present

## 2020-08-29 DIAGNOSIS — H2522 Age-related cataract, morgagnian type, left eye: Secondary | ICD-10-CM | POA: Diagnosis not present

## 2020-08-29 DIAGNOSIS — Z20822 Contact with and (suspected) exposure to covid-19: Secondary | ICD-10-CM | POA: Insufficient documentation

## 2020-08-29 DIAGNOSIS — E1136 Type 2 diabetes mellitus with diabetic cataract: Secondary | ICD-10-CM | POA: Insufficient documentation

## 2020-08-29 DIAGNOSIS — Z01812 Encounter for preprocedural laboratory examination: Secondary | ICD-10-CM | POA: Insufficient documentation

## 2020-08-29 DIAGNOSIS — Z79899 Other long term (current) drug therapy: Secondary | ICD-10-CM | POA: Insufficient documentation

## 2020-08-29 DIAGNOSIS — H33023 Retinal detachment with multiple breaks, bilateral: Secondary | ICD-10-CM | POA: Insufficient documentation

## 2020-08-29 HISTORY — PX: CATARACT EXTRACTION W/PHACO: SHX586

## 2020-08-29 LAB — GLUCOSE, CAPILLARY: Glucose-Capillary: 94 mg/dL (ref 70–99)

## 2020-08-29 LAB — SARS CORONAVIRUS 2 BY RT PCR (HOSPITAL ORDER, PERFORMED IN ~~LOC~~ HOSPITAL LAB): SARS Coronavirus 2: NEGATIVE

## 2020-08-29 SURGERY — PHACOEMULSIFICATION, CATARACT, WITH IOL INSERTION
Anesthesia: Monitor Anesthesia Care | Site: Eye | Laterality: Left

## 2020-08-29 MED ORDER — SODIUM HYALURONATE 23MG/ML IO SOSY
PREFILLED_SYRINGE | INTRAOCULAR | Status: DC | PRN
  Administered 2020-08-29: 0.6 mL via INTRAOCULAR

## 2020-08-29 MED ORDER — TETRACAINE HCL 0.5 % OP SOLN
1.0000 [drp] | OPHTHALMIC | Status: AC | PRN
  Administered 2020-08-29 (×3): 1 [drp] via OPHTHALMIC

## 2020-08-29 MED ORDER — NEOMYCIN-POLYMYXIN-DEXAMETH 3.5-10000-0.1 OP SUSP
OPHTHALMIC | Status: DC | PRN
  Administered 2020-08-29: 1 [drp] via OPHTHALMIC

## 2020-08-29 MED ORDER — SODIUM HYALURONATE 10 MG/ML IO SOLUTION
PREFILLED_SYRINGE | INTRAOCULAR | Status: DC | PRN
  Administered 2020-08-29: 0.85 mL via INTRAOCULAR

## 2020-08-29 MED ORDER — PHENYLEPHRINE-KETOROLAC 1-0.3 % IO SOLN
INTRAOCULAR | Status: AC
  Filled 2020-08-29: qty 4

## 2020-08-29 MED ORDER — TRYPAN BLUE 0.06 % OP SOLN
OPHTHALMIC | Status: AC
  Filled 2020-08-29: qty 0.5

## 2020-08-29 MED ORDER — LIDOCAINE HCL (PF) 1 % IJ SOLN
INTRAOCULAR | Status: DC | PRN
  Administered 2020-08-29: 1 mL via OPHTHALMIC

## 2020-08-29 MED ORDER — BSS IO SOLN
INTRAOCULAR | Status: DC | PRN
  Administered 2020-08-29: 15 mL

## 2020-08-29 MED ORDER — PHENYLEPHRINE-KETOROLAC 1-0.3 % IO SOLN
INTRAOCULAR | Status: DC | PRN
  Administered 2020-08-29: 500 mL via OPHTHALMIC

## 2020-08-29 MED ORDER — LIDOCAINE HCL 3.5 % OP GEL
1.0000 "application " | Freq: Once | OPHTHALMIC | Status: AC
  Administered 2020-08-29: 1 via OPHTHALMIC

## 2020-08-29 MED ORDER — POVIDONE-IODINE 5 % OP SOLN
OPHTHALMIC | Status: DC | PRN
  Administered 2020-08-29: 1 via OPHTHALMIC

## 2020-08-29 MED ORDER — EPINEPHRINE PF 1 MG/ML IJ SOLN
INTRAMUSCULAR | Status: AC
  Filled 2020-08-29: qty 1

## 2020-08-29 MED ORDER — STERILE WATER FOR IRRIGATION IR SOLN
Status: DC | PRN
  Administered 2020-08-29: 250 mL

## 2020-08-29 MED ORDER — PHENYLEPHRINE HCL 2.5 % OP SOLN
1.0000 [drp] | OPHTHALMIC | Status: AC | PRN
  Administered 2020-08-29 (×3): 1 [drp] via OPHTHALMIC

## 2020-08-29 MED ORDER — TRYPAN BLUE 0.06 % OP SOLN
OPHTHALMIC | Status: DC | PRN
  Administered 2020-08-29: 0.5 mL via INTRAOCULAR

## 2020-08-29 MED ORDER — TROPICAMIDE 1 % OP SOLN
1.0000 [drp] | OPHTHALMIC | Status: AC
  Administered 2020-08-29 (×3): 1 [drp] via OPHTHALMIC

## 2020-08-29 SURGICAL SUPPLY — 12 items
CLOTH BEACON ORANGE TIMEOUT ST (SAFETY) ×2 IMPLANT
EYE SHIELD UNIVERSAL CLEAR (GAUZE/BANDAGES/DRESSINGS) ×2 IMPLANT
GLOVE SURG UNDER POLY LF SZ7 (GLOVE) ×4 IMPLANT
NEEDLE HYPO 18GX1.5 BLUNT FILL (NEEDLE) ×2 IMPLANT
PAD ARMBOARD 7.5X6 YLW CONV (MISCELLANEOUS) ×2 IMPLANT
RING MALYGIN 7.0 (MISCELLANEOUS) IMPLANT
SYR TB 1ML LL NO SAFETY (SYRINGE) ×2 IMPLANT
TAPE SURG TRANSPORE 1 IN (GAUZE/BANDAGES/DRESSINGS) ×1 IMPLANT
TAPE SURGICAL TRANSPORE 1 IN (GAUZE/BANDAGES/DRESSINGS) ×1
TECNIS 1 PIECE IOL (Intraocular Lens) ×2 IMPLANT
VISCOELASTIC ADDITIONAL (OPHTHALMIC RELATED) IMPLANT
WATER STERILE IRR 250ML POUR (IV SOLUTION) ×2 IMPLANT

## 2020-08-29 NOTE — Progress Notes (Signed)
I had to release Covid order so I could take patient's rapid specimen to the lab.

## 2020-08-29 NOTE — Discharge Instructions (Addendum)
PATIENT INSTRUCTIONS POST-ANESTHESIA  IMMEDIATELY FOLLOWING SURGERY:  Do not drive or operate machinery for the first twenty four hours after surgery.  Do not make any important decisions for twenty four hours after surgery or while taking narcotic pain medications or sedatives.  If you develop intractable nausea and vomiting or a severe headache please notify your doctor immediately.  FOLLOW-UP:  Please make an appointment with your surgeon as instructed. You do not need to follow up with anesthesia unless specifically instructed to do so.  WOUND CARE INSTRUCTIONS (if applicable):  Keep a dry clean dressing on the anesthesia/puncture wound site if there is drainage.  Once the wound has quit draining you may leave it open to air.  Generally you should leave the bandage intact for twenty four hours unless there is drainage.  If the epidural site drains for more than 36-48 hours please call the anesthesia department.  QUESTIONS?:  Please feel free to call your physician or the hospital operator if you have any questions, and they will be happy to assist you.      Please discharge patient when stable, will follow up today with Dr. Wrzosek at the [facility] [place] office immediately following discharge.  Leave shield in place until visit.  All paperwork with discharge instructions will be given at the office.   [facility] Address:  [address]  , [place] [address]  

## 2020-08-29 NOTE — Op Note (Signed)
Date of procedure: 08/29/20  Pre-operative diagnosis: Mature, Visually significant age-related cataract, Left Eye (H25.22); Poor dilation left eye  Post-operative diagnosis: Mature Visually significant age-related cataract, Left Eye; Intraoperative Floppy Iris Syndrome, left eye  Procedure: Complex Removal of cataract via phacoemulsification and insertion of intra-ocular lens AMO DCB00 +22.0D into the capsular bag of the Left Eye  Attending surgeon: Gerda Diss. Secily Walthour, MD, MA  Anesthesia: MAC, Topical Akten  Complications: None  Estimated Blood Loss: <64m (minimal)  Specimens: None  Implants: As above  Indications:  Mature Visually significant age-related cataract, Left Eye  Procedure:  The patient was seen and identified in the pre-operative area. The operative eye was identified and dilated.  The operative eye was marked.  Topical anesthesia was administered to the operative eye.     The patient was then to the operative suite and placed in the supine position.  A timeout was performed confirming the patient, procedure to be performed, and all other relevant information.   The patient's face was prepped and draped in the usual fashion for intra-ocular surgery.  A lid speculum was placed into the operative eye and the surgical microscope moved into place and focused.  A lack of red reflex due to a mature cataract was confirmed.  An inferotemporal paracentesis was created using a 20 gauge paracentesis blade.  Vision blue was injected into the anterior chamber.  Shugarcaine was injected into the anterior chamber.  Viscoelastic was injected into the anterior chamber.  A temporal clear-corneal main wound incision was created using a 2.462mmicrokeratome. A 7.63m71malyugin ring was placed.   A continuous curvilinear capsulorrhexis was initiated using an irrigating cystitome and completed using capsulorrhexis forceps. A superotemoral tear-out of the capsulorrhexis occurred.  This was saved and  completed.  Hydrodissection and hydrodeliniation were performed.  Viscoelastic was injected into the anterior chamber.  A phacoemulsification handpiece and a chopper as a second instrument were used to remove the nucleus and epinucleus. The irrigation/aspiration handpiece was used to remove any remaining cortical material.   The capsular bag was reinflated with viscoelastic, checked, and found to be intact.  There was not apparent posterior radialization of the anterior capsule tear out. The intraocular lens was inserted into the capsular bag and dialed into place using a kuglen hook. The Malyugin ring was removed. The irrigation/aspiration handpiece was used to remove any remaining viscoelastic.  The clear corneal wound and paracentesis wounds were then hydrated and checked with Weck-Cels to be watertight.  The lid-speculum and drape was removed, and the patient's face was cleaned with a wet and dry 4x4.  Maxitrol was instilled in the eye before a clear shield was taped over the eye. The patient was taken to the post-operative care unit in good condition, having tolerated the procedure well.  Post-Op Instructions: [facility] and will receive all other orders and instructions.

## 2020-08-29 NOTE — Anesthesia Postprocedure Evaluation (Signed)
Anesthesia Post Note  Patient: [name] Barnett  Procedure(s) Performed: CATARACT EXTRACTION PHACO AND INTRAOCULAR LENS PLACEMENT LEFT EYE (Left Eye)  Patient location during evaluation: Phase II Anesthesia Type: MAC Level of consciousness: awake and alert and oriented Pain management: pain level controlled Vital Signs Assessment: post-procedure vital signs reviewed and stable Respiratory status: spontaneous breathing and respiratory function stable Cardiovascular status: stable Postop Assessment: no apparent nausea or vomiting Anesthetic complications: no   No complications documented.   Last Vitals:  Vitals:   08/29/20 1256 08/29/20 1359  BP: (!) 141/105 (!) 148/105  Pulse: 89 84  Resp: 16 18  Temp: 36.9 C 37 C  SpO2: 100% 100%    Last Pain:  Vitals:   08/29/20 1359  TempSrc: Oral  PainSc: 0-No pain                 Syna Gad C Elnor Renovato

## 2020-08-29 NOTE — Transfer of Care (Signed)
Immediate Anesthesia Transfer of Care Note  Patient: [name] Barnett  Procedure(s) Performed: CATARACT EXTRACTION PHACO AND INTRAOCULAR LENS PLACEMENT LEFT EYE (Left Eye)  Patient Location: PACU  Anesthesia Type:MAC  Level of Consciousness: awake, alert  and oriented  Airway & Oxygen Therapy: Patient Spontanous Breathing  Post-op Assessment: Report given to RN and Post -op Vital signs reviewed and stable  Post vital signs: Reviewed and stable  Last Vitals:  Vitals Value Taken Time  BP    Temp    Pulse    Resp    SpO2      Last Pain:  Vitals:   08/29/20 1256  TempSrc: Oral  PainSc: 0-No pain         Complications: No complications documented.

## 2020-08-29 NOTE — Anesthesia Preprocedure Evaluation (Signed)
Anesthesia Evaluation  Patient identified by MRN, date of birth, ID band Patient awake    Reviewed: Allergy & Precautions, NPO status , Patient's Chart, lab work & pertinent test results  History of Anesthesia Complications Negative for: history of anesthetic complications  Airway Mallampati: II  TM Distance: >3 FB Neck ROM: Full    Dental  (+) Dental Advisory Given, Teeth Intact   Pulmonary neg pulmonary ROS,    Pulmonary exam normal breath sounds clear to auscultation       Cardiovascular Exercise Tolerance: Good hypertension, Pt. on medications Normal cardiovascular exam Rhythm:Regular Rate:Normal     Neuro/Psych CVA    GI/Hepatic Neg liver ROS, GERD  Medicated,  Endo/Other  diabetes, Well Controlled, Type 2, Oral Hypoglycemic Agents  Renal/GU DialysisRenal disease     Musculoskeletal   Abdominal   Peds  Hematology  (+) anemia ,   Anesthesia Other Findings   Reproductive/Obstetrics                            Anesthesia Physical Anesthesia Plan  ASA: III  Anesthesia Plan: MAC   Post-op Pain Management:    Induction:   PONV Risk Score and Plan:   Airway Management Planned: Nasal Cannula and Natural Airway  Additional Equipment:   Intra-op Plan:   Post-operative Plan:   Informed Consent: I have reviewed the patients History and Physical, chart, labs and discussed the procedure including the risks, benefits and alternatives for the proposed anesthesia with the patient or authorized representative who has indicated his/her understanding and acceptance.       Plan Discussed with: CRNA and Surgeon  Anesthesia Plan Comments:         Anesthesia Quick Evaluation

## 2020-08-29 NOTE — Interval H&P Note (Signed)
History and Physical Interval Note:  08/29/2020 1:29 PM  [name] Barnett  has presented today for surgery, with the diagnosis of Nuclear sclerotic cataract - Left eye.  The various methods of treatment have been discussed with the patient and family. After consideration of risks, benefits and other options for treatment, the patient has consented to  Procedure(s) with comments: CATARACT EXTRACTION PHACO AND INTRAOCULAR LENS PLACEMENT LEFT EYE (Left) - left as a surgical intervention.  The patient's history has been reviewed, patient examined, no change in status, stable for surgery.  I have reviewed the patient's chart and labs.  Questions were answered to the patient's satisfaction.     Baruch Goldmann

## 2020-09-02 ENCOUNTER — Encounter (HOSPITAL_COMMUNITY): Payer: Self-pay | Admitting: Ophthalmology

## 2020-09-02 LAB — TSH: TSH: 0.61 (ref 0.41–5.90)

## 2020-09-03 ENCOUNTER — Ambulatory Visit (HOSPITAL_COMMUNITY)
Admission: RE | Admit: 2020-09-03 | Discharge: 2020-09-03 | Disposition: A | Discharge status: Home / Self Care | Payer: Medicare Other | Source: Ambulatory Visit | Attending: Specialist | Admitting: Specialist

## 2020-09-03 ENCOUNTER — Other Ambulatory Visit: Payer: Self-pay

## 2020-09-03 DIAGNOSIS — M5136 Other intervertebral disc degeneration, lumbar region: Secondary | ICD-10-CM | POA: Insufficient documentation

## 2020-09-03 DIAGNOSIS — M4649 Discitis, unspecified, multiple sites in spine: Secondary | ICD-10-CM | POA: Diagnosis present

## 2020-09-10 ENCOUNTER — Telehealth: Payer: Self-pay | Admitting: Nurse Practitioner

## 2020-09-10 NOTE — Telephone Encounter (Signed)
Spoke with Arbie Cookey at [facility] and she is contacting [facility] to sched since it 3 appts and she needs transportation.

## 2020-09-10 NOTE — Telephone Encounter (Signed)
I see pt has appt next Wednesday but has not been sch for uptake and scan. Please advise.

## 2020-09-15 ENCOUNTER — Ambulatory Visit (INDEPENDENT_AMBULATORY_CARE_PROVIDER_SITE_OTHER): Payer: Medicare Other | Admitting: Specialist

## 2020-09-15 ENCOUNTER — Encounter: Payer: Self-pay | Admitting: Specialist

## 2020-09-15 VITALS — BP 138/89 | HR 80 | Ht 65.0 in | Wt 166.0 lb

## 2020-09-15 DIAGNOSIS — M4649 Discitis, unspecified, multiple sites in spine: Secondary | ICD-10-CM

## 2020-09-15 DIAGNOSIS — M4726 Other spondylosis with radiculopathy, lumbar region: Secondary | ICD-10-CM | POA: Diagnosis not present

## 2020-09-15 DIAGNOSIS — I69354 Hemiplegia and hemiparesis following cerebral infarction affecting left non-dominant side: Secondary | ICD-10-CM

## 2020-09-15 DIAGNOSIS — M51369 Other intervertebral disc degeneration, lumbar region without mention of lumbar back pain or lower extremity pain: Secondary | ICD-10-CM

## 2020-09-15 DIAGNOSIS — M5136 Other intervertebral disc degeneration, lumbar region: Secondary | ICD-10-CM

## 2020-09-15 NOTE — Progress Notes (Signed)
Office Visit Note   Patient: Rinnah Peppel           Date of Birth: [birth year]           MRN: [id number] Visit Date: 09/15/2020              Requested by: Julianne Handler, DO [address] [place],  [place] [id number] PCP: Julianne Handler, DO   Assessment & Plan: Visit Diagnoses:  1. Other spondylosis with radiculopathy, lumbar region   2. Degenerative disc disease, lumbar   3. Discitis of multiple sites of spine   4. Hemiparesis affecting left side as late effect of cerebrovascular accident (CVA) ([place])     Plan: Avoid bending, stooping and avoid lifting weights greater than 10 lbs. Avoid prolong standing and walking. Avoid frequent bending and stooping  No lifting greater than 10 lbs. May use ice or moist heat for pain. Weight loss is of benefit. Handicap license is approved. Neurology referral to assess for lumbar radiculopathy and sequelea of stroke as to whether there is lumbar  Motor changed, EMG/NCVs. Lab work to assess the treatment of the discitis, is it healed or is there persistent infection, surgery directed at decompressing and fusing the spine may cause reactivation of the discitis and bone infection at the lumbar levels and unfortunately then systemic infection.  CBC with differential, Sed rate and CRP ordered.   Follow-Up Instructions: Return in about 3 weeks (around 10/06/2020).   Orders:  Orders Placed This Encounter  Procedures  . Sed Rate (ESR)  . C-reactive protein  . CBC with Differential/Platelet  . Ambulatory referral to Neurology   No orders of the defined types were placed in this encounter.     Procedures: No procedures performed   Clinical Data: Findings:  Narrative & Impression CLINICAL DATA:  Discitis of multiple sites of spine. Low back pain, progressive neurological deficit. Lumbar radiculopathy, cancer or infection suspected. Low back pain, infection suspected. Lumbar go and sciatica, preview sepsis,  radiographic signs of discitis L4-5.  EXAM: MRI LUMBAR SPINE WITHOUT CONTRAST  TECHNIQUE: Multiplanar, multisequence MR imaging of the lumbar spine was performed. No intravenous contrast was administered.  COMPARISON:  CT of the lumbar spine March 07, 2019.  FINDINGS: Segmentation:  Standard.  Alignment:  Physiologic.  Vertebrae: Loss of disc height and endplate erosive changes at L4-5 consistent with sequela of prior discitis/osteomyelitis seen on prior CT. No disc or marrow edema to suggest active disease. Endplate degenerative changes at L3-4 and L5-S1 are most consistent with degenerative disease. Diffuse decrease of the T1 signal throughout the visualized spine may be related to red marrow reconversion.  Conus medullaris and cauda equina: Conus extends to the T12-L1 level. Conus and cauda equina appear normal.  Paraspinal and other soft tissues: Negative.  Disc levels:  T12-L1: No spinal canal or neural foraminal stenosis.  L1-2: Mild facet degenerative changes. No spinal canal or neural foraminal stenosis.  L2-3: Mild facet degenerative changes. No spinal canal or neural foraminal stenosis.  L3-4: Loss of disc height and mild facet degenerative changes without significant spinal canal or neural foraminal stenosis.  L4-5: Endplate spurring and mild facet degenerative changes resulting in moderate right and moderate to severe left neural foraminal narrowing. No spinal canal stenosis.  L5-S1: Mild loss of disc height, shallow disc bulge with superimposed left central disc protrusion causing small indentation of the thecal sac and resulting in mild narrowing of the left subarticular zone. Mild facet degenerative changes. Mild right and moderate  left neural foraminal narrowing, likely impinging on the exiting left L5 nerve root.  IMPRESSION: 1. Sequela of prior discitis/osteomyelitis at L4-5 with endplate spurring and facet degenerative  changes resulting in moderate right and moderate to severe left neural foraminal narrowing at this level. 2. Degenerative disc disease at L5-S1 resulting in mild right and moderate left neural foraminal narrowing .   Electronically Signed   By: Pedro Earls M.D.   On: 09/04/2020 14:11       Subjective: Chief Complaint  Patient presents with  . Lower Back - Follow-up    MRI Review    40 year old female with history of left hemiparesis and left leg weakness. She underwent an MRI which shows sequele of  Discitis at L4-5 and degenerative disc disease L3-4, L4-5 and L5-S1. Her discitis is recent and likely due to UTI. She is having difficulty  With standing and walking with left leg weakness. No fever of chills. She is staying at [facility] in [place], [place].   Review of Systems  Constitutional: Negative.   HENT: Negative.   Eyes: Positive for redness.  Respiratory: Negative.   Cardiovascular: Negative.   Endocrine: Negative.   Genitourinary: Negative.   Musculoskeletal: Positive for back pain and gait problem.  Skin: Negative.   Allergic/Immunologic: Negative.   Neurological: Positive for weakness.  Hematological: Negative.   Psychiatric/Behavioral: Negative.      Objective: Vital Signs: BP 138/89 (BP Location: Left Arm, Patient Position: Sitting)   Pulse 80   Ht 5' 5"  (1.651 m)   Wt 166 lb (75.3 kg)   LMP 08/19/2020   BMI 27.62 kg/m   Physical Exam Constitutional:      Appearance: She is well-developed.  HENT:     Head: Normocephalic and atraumatic.  Eyes:     Pupils: Pupils are equal, round, and reactive to light.  Pulmonary:     Effort: Pulmonary effort is normal.     Breath sounds: Normal breath sounds.  Abdominal:     General: Bowel sounds are normal.     Palpations: Abdomen is soft.  Musculoskeletal:        General: Normal range of motion.     Cervical back: Normal range of motion and neck supple.     Lumbar back:  Negative right straight leg raise test and negative left straight leg raise test.  Skin:    General: Skin is warm and dry.  Neurological:     Mental Status: She is alert and oriented to person, place, and time.  Psychiatric:        Behavior: Behavior normal.        Thought Content: Thought content normal.        Judgment: Judgment normal.     Back Exam   Tenderness  The patient is experiencing tenderness in the lumbar.  Range of Motion  Extension: normal  Flexion: normal  Lateral bend right: normal  Lateral bend left: normal  Rotation right: normal  Rotation left: normal   Muscle Strength  Right Quadriceps:  5/5  Left Quadriceps:  4/5  Right Hamstrings:  5/5  Left Hamstrings:  4/5   Tests  Straight leg raise right: negative Straight leg raise left: negative  Reflexes  Patellar: 2/4 Achilles: 2/4 Babinski's sign: normal   Other  Toe walk: normal Heel walk: normal Gait: abnormal       Specialty Comments:  No specialty comments available.  Imaging: No results found.   PMFS History: Patient  Active Problem List   Diagnosis Date Noted  . BV (bacterial vaginosis) 04/02/2020  . Vaginal itching 04/02/2020  . Vaginal odor 04/02/2020  . Vaginal discharge 04/02/2020  . Sepsis secondary to UTI ([place]) 01/13/2020  . Acute respiratory failure with hypoxia ([place]) 01/13/2020  . Right leg swelling 01/13/2020  . Sacral decubitus ulcer 01/13/2020  . Anemia due to end stage renal disease ([place]) 01/13/2020  . Essential hypertension 01/13/2020  . Hyperlipidemia 01/13/2020  . Type 2 diabetes mellitus ([place]) 01/13/2020   Past Medical History:  Diagnosis Date  . Diabetes mellitus without complication ([place])   . Hyperlipidemia   . Hypertension   . Renal disorder   . Stroke ([place])   . Vitamin D deficiency     Family History  Problem Relation Age of Onset  . Cancer Paternal Grandmother   . Heart disease Father   . Hypertension Father   . Diabetes Father   . Glaucoma  Father   . Heart disease Mother   . Hypertension Mother   . Diabetes Mother   . Diabetes Sister        borderline  . Hypertension Sister     Past Surgical History:  Procedure Laterality Date  . cardiac stents    . CATARACT EXTRACTION W/PHACO Left 08/29/2020   Procedure: CATARACT EXTRACTION PHACO AND INTRAOCULAR LENS PLACEMENT LEFT EYE;  Surgeon: Baruch Goldmann, MD;  Location: [facility];  Service: Ophthalmology;  Laterality: Left;  left CDE=29.30  . COLOSTOMY    . wound on buttocks     Social History   Occupational History  . Not on file  Tobacco Use  . Smoking status: Never Smoker  . Smokeless tobacco: Never Used  Vaping Use  . Vaping Use: Never used  Substance and Sexual Activity  . Alcohol use: Never  . Drug use: Never  . Sexual activity: Not Currently    Birth control/protection: Surgical    Comment: tubal

## 2020-09-15 NOTE — Pre-Procedure Instructions (Signed)
[name] Barnett, OR scheduler spoke with Marquita Palms, RN and Abbie Sons, RN and arrival time can be 0630 for patients rapid COVID.

## 2020-09-15 NOTE — Patient Instructions (Signed)
Avoid bending, stooping and avoid lifting weights greater than 10 lbs. Avoid prolong standing and walking. Avoid frequent bending and stooping  No lifting greater than 10 lbs. May use ice or moist heat for pain. Weight loss is of benefit. Handicap license is approved. Neurology referral to assess for lumbar radiculopathy and sequelea of stroke as to whether there is lumbar  Motor changed, EMG/NCVs. Lab work to assess the treatment of the discitis, is it healed or is there persistent infection, surgery directed at decompressing and fusing the spine may cause reactivation of the discitis and bone infection at the lumbar levels and unfortunately then systemic infection.  CBC with differential, Sed rate and CRP ordered.

## 2020-09-15 NOTE — Patient Instructions (Signed)
    [place]  09/15/2020     '@PREFPERIOPPHARMACY'$ @   Your procedure is scheduled on  09/22/2020.   Report to [facility] at  [email or web address].  Call this number if you have problems the morning of surgery:  [phone]   Remember:  Do not eat or drink after midnight.                       Take these medicines the morning of surgery with A SIP OF WATER  Amlodipine. Atenolol, protonix, ultram(if needed).  DO NOT take any medications for diabetes the morning of your procedure.  If your glucose is 70 or below, the morning of your procedure, drink 1/2 of clear ,iquid containing sugar and recheck your glucose in 15 minutes. If your glucose is still 70 or below, call [phone].  If your glucose is 300 or above the morning of your procedure, call [phone] for instructions.     Please brush your teeth  Do not wear jewelry, make-up or nail polish.  Do not wear lotions, powders, or perfumes, or deodorant.  Do not shave 48 hours prior to surgery.  Men may shave face and neck.  Do not bring valuables to the hospital.  [facility] is not responsible for any belongings or valuables.  Contacts, dentures or bridgework may not be worn into surgery.  Leave your suitcase in the car.  After surgery it may be brought to your room.  For patients admitted to the hospital, discharge time will be determined by your treatment team.  Patients discharged the day of surgery will not be allowed to drive home and must have someone with them for 24 hours.    Special instructions:  DO NOT smoke tobacco or vape for 24 hours before your procedure.  Please read over the following fact sheets that you were given. Anesthesia Post-op Instructions and Care and Recovery After Surgery

## 2020-09-16 ENCOUNTER — Encounter (HOSPITAL_COMMUNITY): Payer: Self-pay

## 2020-09-16 ENCOUNTER — Other Ambulatory Visit: Payer: Self-pay

## 2020-09-16 ENCOUNTER — Encounter (HOSPITAL_COMMUNITY)
Admit: 2020-09-16 | Discharge: 2020-09-16 | Disposition: A | Discharge status: Home / Self Care | Payer: Medicare Other | Attending: Ophthalmology | Admitting: Ophthalmology

## 2020-09-16 NOTE — H&P (Signed)
Surgical History & Physical  Patient Name: [name] Barnett DOB: 1981/03/05  Surgery: Cataract extraction with intraocular lens implant phacoemulsification; Right Eye  Surgeon: Baruch Goldmann MD Surgery Date:  09/22/2020 Pre-Op Date:  09/11/2020  HPI: A 5 Yr. old female patient PO OS/Pre Op OD The patient is returning after cataract surgery. The left eye is affected. Status post cataract surgery, which began 2 week ago: Since the last visit, the affected area is doing well. The patient's vision is stable. Patient is following medication instructions. Denies any increase in floaters/flashes of light. The patient complains of difficulty when viewing TV, reading closed caption, news scrolls on TV, which began 1 year ago. The right eye is affected. The episode is gradual. The condition's severity increased since last visit. Symptoms occur when the patient is inside and outside. This is negatively affecting her quality of life. HPI was performed by Baruch Goldmann .  Medical History: Cataracts Depressive disorder, Anxiety,Gastroesophageal refl... Diabetes Heart Problem Thyroid Problems  Review of Systems Negative Allergic/Immunologic Negative Cardiovascular Negative Constitutional Negative Ear, Nose, Mouth & Throat Negative Endocrine Negative Eyes Negative Gastrointestinal Negative Genitourinary Negative Hemotologic/Lymphatic Negative Integumentary Negative Musculoskeletal Negative Neurological Negative Psychiatry Negative Respiratory  Social   Never smoked   Medication Ilevro, Moxifloxacin,  Amiloride-HCTZ, Atorvastatin, Glycolax, Hydrocodone, Juven, Nephro-vite, Ropinrole hcl, Senna, Zanaflex, Lantus, Subcutaneous, NovoLog, Subcutaneous, NovoLog, Subcutaneous, Bentyl, Pantoprazole,   Sx/Procedures Retina repair, Phaco c IOL OS,  Colectomy, Stent in heart x 2, Knee, Wrist Sx,   Drug Allergies  Lisinopril, Morphine,   History & Physical: Heent:  Cataract, Right  eye NECK: supple without bruits LUNGS: lungs clear to auscultation CV: regular rate and rhythm Abdomen: soft and non-tender  Impression & Plan: Assessment: 1.  CATARACT EXTRACTION STATUS; Left Eye (Z98.42) 2.  COMBINED FORMS AGE RELATED CATARACT; Right Eye (H25.811) 3.  PCO; Left Eye (H26.492) 4.  BLEPHARITIS; Right Upper Lid, Right Lower Lid, Left Upper Lid, Left Lower Lid (H01.001, H01.002,H01.004,H01.005)  Plan: 1.  1 week after cataract surgery. Doing well with improved vision and normal eye pressure. Call with any problems or concerns. Continue Pred-Moxi-Brom 2x/day for 3 more weeks. 2.  Cataract accounts for the patient's decreased vision. This visual impairment is not correctable with a tolerable change in glasses or contact lenses. Cataract surgery with an implantation of a new lens should significantly improve the visual and functional status of the patient. Discussed all risks, benefits, alternatives, and potential complications. Discussed the procedures and recovery. Patient desires to have surgery. A-scan ordered and performed today for intra-ocular lens calculations. The surgery will be performed in order to improve vision for driving, reading, and for eye examinations. Recommend phacoemulsification with intra-ocular lens. Recommend Dextenza for post-operative pain and inflammation. Right Eye. Surgery required to correct imbalance of vision. Dilates poorly - shugacaine by protocol. Malyugin Ring. Omidira. 3.  Asymptomatic. Findings, prognosis and treatment options reviewed. No indication for laser at this point, will observe for changes. 4.  recommend regular lid cleaning. Warm compresses 7-10 minutes every day, both eyes.

## 2020-09-16 NOTE — Pre-Procedure Instructions (Signed)
[place] and spoke to Nonie Hoyer, Production assistant, radio. She gave information for history and understands all preop information.

## 2020-09-17 ENCOUNTER — Encounter (HOSPITAL_COMMUNITY)
Admission: RE | Admit: 2020-09-17 | Discharge: 2020-09-17 | Disposition: A | Discharge status: Home / Self Care | Payer: Medicare Other | Source: Ambulatory Visit | Attending: Nurse Practitioner | Admitting: Nurse Practitioner

## 2020-09-17 ENCOUNTER — Ambulatory Visit: Payer: No Typology Code available for payment source | Admitting: Nurse Practitioner

## 2020-09-17 DIAGNOSIS — R7989 Other specified abnormal findings of blood chemistry: Secondary | ICD-10-CM | POA: Insufficient documentation

## 2020-09-17 DIAGNOSIS — E0590 Thyrotoxicosis, unspecified without thyrotoxic crisis or storm: Secondary | ICD-10-CM | POA: Diagnosis present

## 2020-09-17 MED ORDER — SODIUM IODIDE I-123 7.4 MBQ CAPS
310.0000 | ORAL_CAPSULE | Freq: Once | ORAL | Status: AC
  Administered 2020-09-17: 310 via ORAL

## 2020-09-18 ENCOUNTER — Encounter (HOSPITAL_COMMUNITY)
Admission: RE | Admit: 2020-09-18 | Discharge: 2020-09-18 | Disposition: A | Discharge status: Home / Self Care | Payer: Medicare Other | Source: Ambulatory Visit | Attending: Nurse Practitioner | Admitting: Nurse Practitioner

## 2020-09-22 ENCOUNTER — Encounter (HOSPITAL_COMMUNITY): Payer: Self-pay | Admitting: Ophthalmology

## 2020-09-22 ENCOUNTER — Ambulatory Visit (HOSPITAL_COMMUNITY): Payer: Medicare Other | Admitting: Anesthesiology

## 2020-09-22 ENCOUNTER — Other Ambulatory Visit: Payer: Self-pay

## 2020-09-22 ENCOUNTER — Encounter (HOSPITAL_COMMUNITY)
Admission: RE | Disposition: A | Discharge status: Other Acute Inpatient Hospital | Payer: Self-pay | Source: Home / Self Care | Attending: Ophthalmology

## 2020-09-22 ENCOUNTER — Emergency Department (HOSPITAL_COMMUNITY)
Admission: EM | Admit: 2020-09-22 | Discharge: 2020-09-22 | Disposition: A | Discharge status: Home / Self Care | Payer: Medicare Other | Attending: Emergency Medicine | Admitting: Emergency Medicine

## 2020-09-22 ENCOUNTER — Encounter (HOSPITAL_COMMUNITY): Payer: Self-pay

## 2020-09-22 ENCOUNTER — Other Ambulatory Visit (HOSPITAL_COMMUNITY)
Admit: 2020-09-22 | Discharge: 2020-09-22 | Disposition: A | Discharge status: Home / Self Care | Payer: Medicare Other | Source: Ambulatory Visit | Attending: Ophthalmology | Admitting: Ophthalmology

## 2020-09-22 ENCOUNTER — Ambulatory Visit (HOSPITAL_COMMUNITY)
Admission: RE | Admit: 2020-09-22 | Discharge: 2020-09-22 | Disposition: A | Discharge status: Other Acute Inpatient Hospital | Payer: Medicare Other | Source: Home / Self Care | Attending: Ophthalmology | Admitting: Ophthalmology

## 2020-09-22 DIAGNOSIS — I120 Hypertensive chronic kidney disease with stage 5 chronic kidney disease or end stage renal disease: Secondary | ICD-10-CM | POA: Diagnosis not present

## 2020-09-22 DIAGNOSIS — Z9842 Cataract extraction status, left eye: Secondary | ICD-10-CM | POA: Insufficient documentation

## 2020-09-22 DIAGNOSIS — Z20822 Contact with and (suspected) exposure to covid-19: Secondary | ICD-10-CM | POA: Insufficient documentation

## 2020-09-22 DIAGNOSIS — Z992 Dependence on renal dialysis: Secondary | ICD-10-CM | POA: Insufficient documentation

## 2020-09-22 DIAGNOSIS — Z794 Long term (current) use of insulin: Secondary | ICD-10-CM | POA: Diagnosis not present

## 2020-09-22 DIAGNOSIS — E1122 Type 2 diabetes mellitus with diabetic chronic kidney disease: Secondary | ICD-10-CM | POA: Diagnosis not present

## 2020-09-22 DIAGNOSIS — H25811 Combined forms of age-related cataract, right eye: Secondary | ICD-10-CM | POA: Insufficient documentation

## 2020-09-22 DIAGNOSIS — H26492 Other secondary cataract, left eye: Secondary | ICD-10-CM | POA: Insufficient documentation

## 2020-09-22 DIAGNOSIS — Z961 Presence of intraocular lens: Secondary | ICD-10-CM | POA: Insufficient documentation

## 2020-09-22 DIAGNOSIS — Z79899 Other long term (current) drug therapy: Secondary | ICD-10-CM | POA: Insufficient documentation

## 2020-09-22 DIAGNOSIS — H0100B Unspecified blepharitis left eye, upper and lower eyelids: Secondary | ICD-10-CM | POA: Insufficient documentation

## 2020-09-22 DIAGNOSIS — N186 End stage renal disease: Secondary | ICD-10-CM | POA: Diagnosis not present

## 2020-09-22 DIAGNOSIS — Z01810 Encounter for preprocedural cardiovascular examination: Secondary | ICD-10-CM | POA: Insufficient documentation

## 2020-09-22 DIAGNOSIS — Z955 Presence of coronary angioplasty implant and graft: Secondary | ICD-10-CM | POA: Insufficient documentation

## 2020-09-22 DIAGNOSIS — E1136 Type 2 diabetes mellitus with diabetic cataract: Secondary | ICD-10-CM | POA: Insufficient documentation

## 2020-09-22 DIAGNOSIS — H0100A Unspecified blepharitis right eye, upper and lower eyelids: Secondary | ICD-10-CM | POA: Insufficient documentation

## 2020-09-22 DIAGNOSIS — E875 Hyperkalemia: Secondary | ICD-10-CM

## 2020-09-22 DIAGNOSIS — R079 Chest pain, unspecified: Secondary | ICD-10-CM | POA: Insufficient documentation

## 2020-09-22 DIAGNOSIS — Z7982 Long term (current) use of aspirin: Secondary | ICD-10-CM | POA: Diagnosis not present

## 2020-09-22 DIAGNOSIS — Z5309 Procedure and treatment not carried out because of other contraindication: Secondary | ICD-10-CM | POA: Insufficient documentation

## 2020-09-22 LAB — COMPREHENSIVE METABOLIC PANEL
ALT: 14 U/L (ref 0–44)
AST: 9 U/L — ABNORMAL LOW (ref 15–41)
Albumin: 3.6 g/dL (ref 3.5–5.0)
Alkaline Phosphatase: 136 U/L — ABNORMAL HIGH (ref 38–126)
Anion gap: 12 (ref 5–15)
BUN: 61 mg/dL — ABNORMAL HIGH (ref 6–20)
CO2: 25 mmol/L (ref 22–32)
Calcium: 8.6 mg/dL — ABNORMAL LOW (ref 8.9–10.3)
Chloride: 100 mmol/L (ref 98–111)
Creatinine, Ser: 9.77 mg/dL — ABNORMAL HIGH (ref 0.44–1.00)
GFR, Estimated: 5 mL/min — ABNORMAL LOW (ref >60)
Glucose, Bld: 67 mg/dL — ABNORMAL LOW (ref 70–99)
Potassium: 6.1 mmol/L — ABNORMAL HIGH (ref 3.5–5.1)
Sodium: 137 mmol/L (ref 135–145)
Total Bilirubin: 0.7 mg/dL (ref 0.3–1.2)
Total Protein: 7.5 g/dL (ref 6.5–8.1)

## 2020-09-22 LAB — CBC WITH DIFFERENTIAL/PLATELET
Abs Immature Granulocytes: 0.02 10*3/uL (ref 0.00–0.07)
Basophils Absolute: 0 10*3/uL (ref 0.0–0.1)
Basophils Relative: 1 %
Eosinophils Absolute: 0.5 10*3/uL (ref 0.0–0.5)
Eosinophils Relative: 6 %
HCT: 35 % — ABNORMAL LOW (ref 36.0–46.0)
Hemoglobin: 10.8 g/dL — ABNORMAL LOW (ref 12.0–15.0)
Immature Granulocytes: 0 %
Lymphocytes Relative: 30 %
Lymphs Abs: 2.3 10*3/uL (ref 0.7–4.0)
MCH: 31 pg (ref 26.0–34.0)
MCHC: 30.9 g/dL (ref 30.0–36.0)
MCV: 100.6 fL — ABNORMAL HIGH (ref 80.0–100.0)
Monocytes Absolute: 0.6 10*3/uL (ref 0.1–1.0)
Monocytes Relative: 8 %
Neutro Abs: 4.2 10*3/uL (ref 1.7–7.7)
Neutrophils Relative %: 55 %
Platelets: 338 10*3/uL (ref 150–400)
RBC: 3.48 MIL/uL — ABNORMAL LOW (ref 3.87–5.11)
RDW: 13.1 % (ref 11.5–15.5)
WBC: 7.6 10*3/uL (ref 4.0–10.5)
nRBC: 0 % (ref 0.0–0.2)

## 2020-09-22 LAB — MAGNESIUM: Magnesium: 2.3 mg/dL (ref 1.7–2.4)

## 2020-09-22 LAB — BASIC METABOLIC PANEL
Anion gap: 11 (ref 5–15)
BUN: 61 mg/dL — ABNORMAL HIGH (ref 6–20)
CO2: 26 mmol/L (ref 22–32)
Calcium: 8.6 mg/dL — ABNORMAL LOW (ref 8.9–10.3)
Chloride: 97 mmol/L — ABNORMAL LOW (ref 98–111)
Creatinine, Ser: 9.96 mg/dL — ABNORMAL HIGH (ref 0.44–1.00)
GFR, Estimated: 5 mL/min — ABNORMAL LOW (ref >60)
Glucose, Bld: 115 mg/dL — ABNORMAL HIGH (ref 70–99)
Potassium: 6.5 mmol/L (ref 3.5–5.1)
Sodium: 134 mmol/L — ABNORMAL LOW (ref 135–145)

## 2020-09-22 LAB — SARS CORONAVIRUS 2 BY RT PCR (HOSPITAL ORDER, PERFORMED IN ~~LOC~~ HOSPITAL LAB): SARS Coronavirus 2: NEGATIVE

## 2020-09-22 SURGERY — PHACOEMULSIFICATION, CATARACT, WITH IOL INSERTION
Anesthesia: Monitor Anesthesia Care | Laterality: Right

## 2020-09-22 MED ORDER — CHLORHEXIDINE GLUCONATE CLOTH 2 % EX PADS: 6.0000 | MEDICATED_PAD | Freq: Every day | CUTANEOUS | Status: DC

## 2020-09-22 MED ORDER — PHENYLEPHRINE-KETOROLAC 1-0.3 % IO SOLN
INTRAOCULAR | Status: AC
  Filled 2020-09-22: qty 4

## 2020-09-22 MED ORDER — LIDOCAINE HCL 3.5 % OP GEL: 1.0000 "application " | Freq: Once | OPHTHALMIC | Status: DC

## 2020-09-22 MED ORDER — DOXERCALCIFEROL 4 MCG/2ML IV SOLN
INTRAVENOUS | Status: AC
  Administered 2020-09-22: 5 ug via INTRAVENOUS
  Filled 2020-09-22: qty 2

## 2020-09-22 MED ORDER — SODIUM CHLORIDE 0.9 % IV SOLN: 100.0000 mL | INTRAVENOUS | Status: DC | PRN

## 2020-09-22 MED ORDER — PHENYLEPHRINE HCL 2.5 % OP SOLN
1.0000 [drp] | OPHTHALMIC | Status: DC | PRN
  Administered 2020-09-22: 1 [drp] via OPHTHALMIC

## 2020-09-22 MED ORDER — TROPICAMIDE 1 % OP SOLN
1.0000 [drp] | OPHTHALMIC | Status: AC
  Administered 2020-09-22: 1 [drp] via OPHTHALMIC

## 2020-09-22 MED ORDER — DOXERCALCIFEROL 4 MCG/2ML IV SOLN
INTRAVENOUS | Status: AC
  Filled 2020-09-22: qty 2

## 2020-09-22 MED ORDER — HEPARIN SODIUM (PORCINE) 1000 UNIT/ML DIALYSIS: 20.0000 [IU]/kg | INTRAMUSCULAR | Status: DC | PRN

## 2020-09-22 MED ORDER — PENTAFLUOROPROP-TETRAFLUOROETH EX AERO: 1.0000 "application " | INHALATION_SPRAY | CUTANEOUS | Status: DC | PRN

## 2020-09-22 MED ORDER — DOXERCALCIFEROL 4 MCG/2ML IV SOLN
5.0000 ug | INTRAVENOUS | Status: DC
  Filled 2020-09-22 (×2): qty 4

## 2020-09-22 MED ORDER — TETRACAINE HCL 0.5 % OP SOLN
1.0000 [drp] | OPHTHALMIC | Status: DC | PRN
  Administered 2020-09-22: 1 [drp] via OPHTHALMIC

## 2020-09-22 MED ORDER — CINACALCET HCL 30 MG PO TABS
30.0000 mg | ORAL_TABLET | Freq: Every day | ORAL | Status: DC
  Filled 2020-09-22 (×2): qty 1

## 2020-09-22 MED ORDER — LIDOCAINE-PRILOCAINE 2.5-2.5 % EX CREA: 1.0000 "application " | TOPICAL_CREAM | CUTANEOUS | Status: DC | PRN

## 2020-09-22 MED ORDER — HEPARIN SODIUM (PORCINE) 1000 UNIT/ML IJ SOLN
INTRAMUSCULAR | Status: AC
  Filled 2020-09-22: qty 2

## 2020-09-22 MED ORDER — LIDOCAINE HCL (PF) 1 % IJ SOLN: 5.0000 mL | INTRAMUSCULAR | Status: DC | PRN

## 2020-09-22 SURGICAL SUPPLY — 11 items
CLOTH BEACON ORANGE TIMEOUT ST (SAFETY) IMPLANT
GLOVE SURG UNDER POLY LF SZ6.5 (GLOVE) IMPLANT
GLOVE SURG UNDER POLY LF SZ7 (GLOVE) IMPLANT
NEEDLE HYPO 18GX1.5 BLUNT FILL (NEEDLE) IMPLANT
PAD ARMBOARD 7.5X6 YLW CONV (MISCELLANEOUS) IMPLANT
RING MALYGIN (MISCELLANEOUS) IMPLANT
RING MALYGIN 7.0 (MISCELLANEOUS) IMPLANT
SIGHTPATH CAT PROC W REG LENS (Ophthalmic Related) IMPLANT
SYR TB 1ML LL NO SAFETY (SYRINGE) IMPLANT
VISCOELASTIC ADDITIONAL (OPHTHALMIC RELATED) IMPLANT
WATER STERILE IRR 250ML POUR (IV SOLUTION) IMPLANT

## 2020-09-22 NOTE — Procedures (Signed)
EMERGENT HEMODIALYSIS TREATMENT NOTE:  3.5 hour low-heparin HD completed via right upper arm AVF (15g/antegrade). Goal met: 4 liters removed without interruption in UF.  All blood was returned and hemostasis was achieved in 15 minutes.  Pt states she will go to regularly scheduled outpatient HD session tomorrow.    [name] Poteat, RN 

## 2020-09-22 NOTE — ED Provider Notes (Signed)
[facility] EMERGENCY DEPARTMENT Provider Note   CSN: [id number] Arrival date & time: 09/22/20  1044     History Chief Complaint  Patient presents with  . Weakness    Potassium is 6.5    [name] Barnett is a 40 y.o. female presenting for evaluation of high potassium.  Patient states her potassium was noted to be high today.  She has had this happen before.  She does not take any potassium supplementation.  She is a dialysis patient, goes Tuesday, Thursday, Saturday.  She not go on Saturday due to difficulties with transportation.  As such, her last dialysis was on Thursday, was normal for her.  She denies chest pain, nausea, vomiting, abdominal pain.  No new medicines.  Additional history obtained per chart review.  Patient with a history of diabetes, hypertension, hyperlipidemia, ESRD on dialysis, stroke.   HPI     Past Medical History:  Diagnosis Date  . Diabetes mellitus without complication ([place])   . Hyperlipidemia   . Hypertension   . Renal disorder   . Stroke ([place])   . Vitamin D deficiency     Patient Active Problem List   Diagnosis Date Noted  . BV (bacterial vaginosis) 04/02/2020  . Vaginal itching 04/02/2020  . Vaginal odor 04/02/2020  . Vaginal discharge 04/02/2020  . Sepsis secondary to UTI ([place]) 01/13/2020  . Acute respiratory failure with hypoxia ([place]) 01/13/2020  . Right leg swelling 01/13/2020  . Sacral decubitus ulcer 01/13/2020  . Anemia due to end stage renal disease ([place]) 01/13/2020  . Essential hypertension 01/13/2020  . Hyperlipidemia 01/13/2020  . Type 2 diabetes mellitus ([place]) 01/13/2020    Past Surgical History:  Procedure Laterality Date  . cardiac stents    . CATARACT EXTRACTION W/PHACO Left 08/29/2020   Procedure: CATARACT EXTRACTION PHACO AND INTRAOCULAR LENS PLACEMENT LEFT EYE;  Surgeon: Baruch Goldmann, MD;  Location: [facility];  Service: Ophthalmology;  Laterality: Left;  left CDE=29.30  . COLOSTOMY    . wound on buttocks        OB History    Gravida  6   Para  3   Term  1   Preterm  2   AB  3   Living  3     SAB  3   IAB      Ectopic      Multiple      Live Births  3           Family History  Problem Relation Age of Onset  . Cancer Paternal Grandmother   . Heart disease Father   . Hypertension Father   . Diabetes Father   . Glaucoma Father   . Heart disease Mother   . Hypertension Mother   . Diabetes Mother   . Diabetes Sister        borderline  . Hypertension Sister     Social History   Tobacco Use  . Smoking status: Never Smoker  . Smokeless tobacco: Never Used  Vaping Use  . Vaping Use: Never used  Substance Use Topics  . Alcohol use: Never  . Drug use: Never    Home Medications Prior to Admission medications   Medication Sig Start Date End Date Taking? Authorizing Provider  acetaminophen (TYLENOL) 325 MG tablet Take 650 mg by mouth every 6 (six) hours as needed.    [provider]  amLODipine (NORVASC) 5 MG tablet Take 5 mg by mouth See admin instructions. Sun, Mon, Wed and Fri  [provider]  ascorbic acid (VITAMIN C) 500 MG tablet Take 500 mg by mouth 2 (two) times daily.    [provider]  aspirin EC 81 MG tablet Take 81 mg by mouth daily. Swallow whole.    [provider]  atenolol (TENORMIN) 25 MG tablet Take 0.5 tablets (12.5 mg total) by mouth daily. Patient taking differently: Take 12.5 mg by mouth See admin instructions. Every Sun, Mon, Wed and Fri 01/17/20   Barton Dubois, MD  atorvastatin (LIPITOR) 80 MG tablet Take 1 tablet by mouth at bedtime.    [provider]  b complex-vitamin c-folic acid (NEPHRO-VITE) 0.8 MG TABS tablet Take 1 tablet by mouth daily.    [provider]  Biotin 5 MG TABS Take 5 mg by mouth in the morning and at bedtime.    [provider]  calcium carbonate (TUMS - DOSED IN MG ELEMENTAL CALCIUM) 500 MG chewable tablet Chew 1,000 mg by mouth 3 (three) times  daily. Before meals    [provider]  cetaphil (CETAPHIL) lotion Apply 1 application topically 2 (two) times daily as needed for dry skin.    [provider]  Cholecalciferol (VITAMIN D) 50 MCG (2000 UT) CAPS Take 2,000 Units by mouth daily.    [provider]  dicyclomine (BENTYL) 10 MG capsule Take 10 mg by mouth 3 (three) times daily before meals.    [provider]  insulin glargine (LANTUS) 100 UNIT/ML Solostar Pen Inject 10 Units into the skin in the morning.  08/21/18   [provider]  insulin lispro (HUMALOG) 100 UNIT/ML injection Inject 5 Units into the skin 3 (three) times daily before meals.    [provider]  Lidocaine 4 % PTCH Apply 1 patch topically daily. Remove at 2100    [provider]  lidocaine-prilocaine (EMLA) cream Apply 1 application topically daily as needed. Apply to HD graft site right arm topically one every tues, thurs, sat for pain.    [provider]  melatonin 3 MG TABS tablet Take 3 mg by mouth at bedtime. 10/31/18   [provider]  Multiple Vitamins-Minerals (CERTAGEN PO) Take 1 tablet by mouth in the morning.    [provider]  pantoprazole (PROTONIX) 40 MG tablet Take 40 mg by mouth in the morning.    [provider]  polyethylene glycol (MIRALAX / GLYCOLAX) 17 g packet Take 17 g by mouth daily.    [provider]  rOPINIRole (REQUIP) 0.5 MG tablet Take 0.5 mg by mouth at bedtime.    [provider]  rOPINIRole (REQUIP) 2 MG tablet Take 2 mg by mouth at bedtime.    [provider]  sennosides-docusate sodium (SENOKOT-S) 8.6-50 MG tablet Take 2 tablets by mouth daily.    [provider]  thiamine 100 MG tablet Take 100 mg by mouth daily.    [provider]  tiZANidine (ZANAFLEX) 4 MG tablet Take 4 mg by mouth at bedtime.    [provider]  traMADol (ULTRAM) 50 MG tablet Take 100 mg by mouth every 6 (six) hours  as needed for severe pain.    [provider]    Allergies    Corn-containing products, Lisinopril, and Morphine and related  Review of Systems   Review of Systems  All other systems reviewed and are negative.   Physical Exam Updated Vital Signs BP (!) 184/106   Pulse 74   Resp 14   Ht '5\' 5"'$  (1.651  m)   Wt 76 kg   SpO2 100%   BMI 27.88 kg/m   Physical Exam Vitals and nursing note reviewed.  Constitutional:      General: She is not in acute distress.    Appearance: She is well-developed.     Comments: Appears tired, not in acute distress  HENT:     Head: Normocephalic and atraumatic.  Eyes:     Extraocular Movements: Extraocular movements intact.     Conjunctiva/sclera: Conjunctivae normal.     Pupils: Pupils are equal, round, and reactive to light.  Cardiovascular:     Rate and Rhythm: Normal rate and regular rhythm.     Pulses: Normal pulses.  Pulmonary:     Effort: Pulmonary effort is normal. No respiratory distress.     Breath sounds: Normal breath sounds. No wheezing.  Abdominal:     General: There is no distension.     Palpations: Abdomen is soft. There is no mass.     Tenderness: There is no abdominal tenderness. There is no guarding or rebound.  Musculoskeletal:        General: Normal range of motion.     Cervical back: Normal range of motion and neck supple.  Skin:    General: Skin is warm and dry.     Capillary Refill: Capillary refill takes less than 2 seconds.  Neurological:     Mental Status: She is alert and oriented to person, place, and time.     ED Results / Procedures / Treatments   Labs (all labs ordered are listed, but only abnormal results are displayed) Labs Reviewed  CBC WITH DIFFERENTIAL/PLATELET - Abnormal; Notable for the following components:      Result Value   RBC 3.48 (*)    Hemoglobin 10.8 (*)    HCT 35.0 (*)    MCV 100.6 (*)    All other components within normal limits  COMPREHENSIVE METABOLIC PANEL - Abnormal;  Notable for the following components:   Potassium 6.1 (*)    Glucose, Bld 67 (*)    BUN 61 (*)    Creatinine, Ser 9.77 (*)    Calcium 8.6 (*)    AST 9 (*)    Alkaline Phosphatase 136 (*)    GFR, Estimated 5 (*)    All other components within normal limits  RESP PANEL BY RT-PCR (FLU A&B, COVID) ARPGX2  MAGNESIUM    EKG EKG Interpretation  Date/Time:  Monday Sep 22 2020 13:48:03 EDT Ventricular Rate:  75 PR Interval:  155 QRS Duration: 85 QT Interval:  434 QTC Calculation: 485 R Axis:   87 Text Interpretation: Sinus rhythm Low voltage, extremity leads Consider anterior infarct No significant change since last tracing Confirmed by Calvert Cantor [phone]) on 09/22/2020 3:11:59 PM   Radiology No results found.  Procedures .Critical Care Performed by: Franchot Heidelberg, PA-C Authorized by: Franchot Heidelberg, PA-C   Critical care provider statement:    Critical care time (minutes):  40   Critical care time was exclusive of:  Separately billable procedures and treating other patients and teaching time   Critical care was necessary to treat or prevent imminent or life-threatening deterioration of the following conditions:  Metabolic crisis   Critical care was time spent personally by me on the following activities:  Blood draw for specimens, development of treatment plan with patient or surrogate, discussions with consultants, evaluation of patient's response to treatment, obtaining history from patient or surrogate, ordering and performing treatments and interventions, ordering and review  of laboratory studies, ordering and review of radiographic studies, pulse oximetry, re-evaluation of patient's condition and review of old charts   I assumed direction of critical care for this patient from another provider in my specialty: no   Comments:     Hyperkalemia requiring emergent dialysis tonight     Medications Ordered in ED Medications  Chlorhexidine Gluconate Cloth 2 % PADS 6  each (has no administration in time range)  doxercalciferol (HECTOROL) injection 5 mcg (has no administration in time range)  cinacalcet (SENSIPAR) tablet 30 mg (has no administration in time range)    ED Course  I have reviewed the triage vital signs and the nursing notes.  Pertinent labs & imaging results that were available during my care of the patient were reviewed by me and considered in my medical decision making (see chart for details).    MDM Rules/Calculators/A&P                          Patient presenting for evaluation of hyperkalemia.  She has a history of dialysis, and did not dialysis yesterday.  This is likely the cause of her hyperkalemia.  We will recheck with our labs and likely plan for treatment with dialysis tonight versus medications.  EKG without significant change/peaked T waves.   Informed by RN that patient is requesting to leave.  I discussed with patient.  She is anxious about getting dialysis soon as possible.  I encouraged her to continue to wait in the ER and that nephrology was being contacted.  Discussed with Dr. Moshe Cipro from nephrology, she is aware that patient is in the department and has already placed dialysis orders.  Patient was informed of this.  Plan for discharge after dialysis.   Final Clinical Impression(s) / ED Diagnoses Final diagnoses:  Hyperkalemia  ESRD needing dialysis [facility])    Rx / DC Orders ED Discharge Orders    None       Franchot Heidelberg, PA-C 09/22/20 1605    Fredia Sorrow, MD 09/27/20 [phone]

## 2020-09-22 NOTE — ED Triage Notes (Signed)
From short stay for cataract surgery, found with a high potassium 6.5 per short stay, pt did not get dialysis on Saturday, from [facility], arrived left [facility]

## 2020-09-22 NOTE — Progress Notes (Signed)
Date and time results received: 09/22/20  (use smartphrase ".now" to insert current time)  Test: Potassium  Critical Value:6.5  Name of Provider Notified:Dr. Charna Elizabeth   Orders Received? Or Actions Taken?:  [employer] RN for patient care preoperative notified. Dr. Charna Elizabeth notified.

## 2020-09-22 NOTE — ED Notes (Signed)
Pt requesting provider nurse and someone to come see her and discharge her "she is demanding the thing out her of neck. This nurse went in to discuss with the patient the consequences of leaving AMA.  The patient questioned this nurse stating why do you keep telling me Im me if I go come I could die?  This nurse explained the consequences of leaving and the patient became upset demanding to speak with the provider.  Provider to bedside and explained to the patient the process and that she was consulting nephrology.  Patient is very upset of the situation and thought she should have dialysis immediately as she was arriving to the ER.  Patient was explained once again in more detail the care plan.  Pt not happy with care in the ER after explained several times the business and the processes.  This nurse and PA attempted to explain on multiple occassions.

## 2020-09-22 NOTE — Progress Notes (Signed)
IStat obtained.  K+ value registered >8.5.  Dr. Charna Elizabeth informed. Orderd BMET.  Awaiting lab draw.

## 2020-09-22 NOTE — ED Notes (Signed)
Pt taken to dialysis will call when dialysis is complete.

## 2020-09-22 NOTE — Progress Notes (Signed)
Was told patient here.  Came for cataract surgery that had to be cancelled because her K was 6.5-  Of note, she missed her OP HD on Saturday.    Will plan for HD today to get K down, then probably could wait until Thursday for next treatment.  Not sure what plans are for cataract surgery   Her OP reg is  [facility]- TTS 3 hours 45 minutes AVF BFR 400/DFR 500  2 k/2.5 calc Hep load 800 , then 400 units per hour sensipar 30, epo 3600 and hect 5 edw 75.5  [name] Barnett A Nickalos Petersen

## 2020-09-24 ENCOUNTER — Other Ambulatory Visit: Payer: Self-pay

## 2020-09-24 ENCOUNTER — Encounter: Payer: Self-pay | Admitting: Nurse Practitioner

## 2020-09-24 ENCOUNTER — Ambulatory Visit (INDEPENDENT_AMBULATORY_CARE_PROVIDER_SITE_OTHER): Payer: Medicare Other | Admitting: Nurse Practitioner

## 2020-09-24 VITALS — BP 140/87 | HR 76 | Ht 65.0 in

## 2020-09-24 DIAGNOSIS — E0590 Thyrotoxicosis, unspecified without thyrotoxic crisis or storm: Secondary | ICD-10-CM | POA: Diagnosis not present

## 2020-09-24 DIAGNOSIS — R7989 Other specified abnormal findings of blood chemistry: Secondary | ICD-10-CM | POA: Diagnosis not present

## 2020-09-24 NOTE — Patient Instructions (Signed)

## 2020-09-24 NOTE — Progress Notes (Signed)
09/24/2020     Endocrinology Follow Up Note    Subjective:    Patient ID: [name] Barnett, female    DOB: 1980/11/10, PCP Sitafalwalla, [place], DO.   Past Medical History:  Diagnosis Date  . Diabetes mellitus without complication ([place])   . Hyperlipidemia   . Hypertension   . Renal disorder   . Stroke ([place])   . Vitamin D deficiency     Past Surgical History:  Procedure Laterality Date  . cardiac stents    . CATARACT EXTRACTION W/PHACO Left 08/29/2020   Procedure: CATARACT EXTRACTION PHACO AND INTRAOCULAR LENS PLACEMENT LEFT EYE;  Surgeon: Baruch Goldmann, MD;  Location: [facility];  Service: Ophthalmology;  Laterality: Left;  left CDE=29.30  . COLOSTOMY    . wound on buttocks      Social History   Socioeconomic History  . Marital status: Legally Separated    Spouse name: Not on file  . Number of children: Not on file  . Years of education: Not on file  . Highest education level: Not on file  Occupational History  . Not on file  Tobacco Use  . Smoking status: Never Smoker  . Smokeless tobacco: Never Used  Vaping Use  . Vaping Use: Never used  Substance and Sexual Activity  . Alcohol use: Never  . Drug use: Never  . Sexual activity: Not Currently    Birth control/protection: Surgical    Comment: tubal  Other Topics Concern  . Not on file  Social History Narrative  . Not on file   Social Determinants of Health   Financial Resource Strain: Low Risk   . Difficulty of Paying Living Expenses: Not very hard  Food Insecurity: No Food Insecurity  . Worried About Charity fundraiser in the Last Year: Never true  . Ran Out of Food in the Last Year: Never true  Transportation Needs: No Transportation Needs  . Lack of Transportation (Medical): No  . Lack of Transportation (Non-Medical): No  Physical Activity: Insufficiently Active  . Days of Exercise per Week: 3 days  . Minutes of Exercise per Session: 30 min  Stress: No Stress Concern Present  .  Feeling of Stress : Not at all  Social Connections: Moderately Isolated  . Frequency of Communication with Friends and Family: More than three times a week  . Frequency of Social Gatherings with Friends and Family: Never  . Attends Religious Services: More than 4 times per year  . Active Member of Clubs or Organizations: No  . Attends Archivist Meetings: Never  . Marital Status: Separated    Family History  Problem Relation Age of Onset  . Cancer Paternal Grandmother   . Heart disease Father   . Hypertension Father   . Diabetes Father   . Glaucoma Father   . Heart disease Mother   . Hypertension Mother   . Diabetes Mother   . Diabetes Sister        borderline  . Hypertension Sister     Outpatient Encounter Medications as of 09/24/2020  Medication Sig  . acetaminophen (TYLENOL) 325 MG tablet Take 650 mg by mouth every 6 (six) hours as needed.  Marland Kitchen amLODipine (NORVASC) 5 MG tablet Take 5 mg by mouth See admin instructions. Sun, Mon, Wed and Fri  . ascorbic acid (VITAMIN C) 500 MG tablet Take 500 mg by mouth 2 (two) times daily.  Marland Kitchen aspirin EC 81 MG tablet Take 81 mg by mouth daily. Swallow  whole.  . atenolol (TENORMIN) 25 MG tablet Take 0.5 tablets (12.5 mg total) by mouth daily. (Patient taking differently: Take 12.5 mg by mouth See admin instructions. Every Sun, Mon, Wed and Fri)  . atorvastatin (LIPITOR) 80 MG tablet Take 1 tablet by mouth at bedtime.  Marland Kitchen b complex-vitamin c-folic acid (NEPHRO-VITE) 0.8 MG TABS tablet Take 1 tablet by mouth daily.  . Biotin 5 MG TABS Take 5 mg by mouth in the morning and at bedtime.  . calcium carbonate (TUMS - DOSED IN MG ELEMENTAL CALCIUM) 500 MG chewable tablet Chew 1,000 mg by mouth 3 (three) times daily. Before meals  . cetaphil (CETAPHIL) lotion Apply 1 application topically 2 (two) times daily as needed for dry skin.  . Cholecalciferol (VITAMIN D) 50 MCG (2000 UT) CAPS Take 2,000 Units by mouth daily.  Marland Kitchen dicyclomine (BENTYL) 10 MG  capsule Take 10 mg by mouth 3 (three) times daily before meals.  . insulin glargine (LANTUS) 100 UNIT/ML Solostar Pen Inject 10 Units into the skin in the morning.   . insulin lispro (HUMALOG) 100 UNIT/ML injection Inject 5 Units into the skin 3 (three) times daily before meals.  . Lidocaine 4 % PTCH Apply 1 patch topically daily. Remove at 2100  . lidocaine-prilocaine (EMLA) cream Apply 1 application topically daily as needed. Apply to HD graft site right arm topically one every tues, thurs, sat for pain.  . melatonin 3 MG TABS tablet Take 3 mg by mouth at bedtime.  . Multiple Vitamins-Minerals (CERTAGEN PO) Take 1 tablet by mouth in the morning.  . pantoprazole (PROTONIX) 40 MG tablet Take 40 mg by mouth in the morning.  . polyethylene glycol (MIRALAX / GLYCOLAX) 17 g packet Take 17 g by mouth daily.  Marland Kitchen rOPINIRole (REQUIP) 0.5 MG tablet Take 0.5 mg by mouth at bedtime.  Marland Kitchen rOPINIRole (REQUIP) 2 MG tablet Take 2 mg by mouth at bedtime.  . sennosides-docusate sodium (SENOKOT-S) 8.6-50 MG tablet Take 2 tablets by mouth daily.  Marland Kitchen thiamine 100 MG tablet Take 100 mg by mouth daily.  Marland Kitchen tiZANidine (ZANAFLEX) 4 MG tablet Take 4 mg by mouth at bedtime.  . traMADol (ULTRAM) 50 MG tablet Take 100 mg by mouth every 6 (six) hours as needed for severe pain.   No facility-administered encounter medications on file as of 09/24/2020.    ALLERGIES: Allergies  Allergen Reactions  . Corn-Containing Products     Per MAR  . Lisinopril     Per MAR  . Morphine And Related     Per MAR    VACCINATION STATUS:  There is no immunization history on file for this patient.   HPI  [name] Barnett is 40 y.o. female who presents today with a medical history as above. she is being seen in follow up after being seen in consultation for hyperthyroidism requested by Sitafalwalla, [place], DO.  she has been dealing with symptoms of intermittent palpitations, tremors, insomnia, and intermittent diarrhea for a few  months now. These symptoms are progressively worsening and troubling to her.  her most recent thyroid labs revealed suppressed TSH of 0.198, T4 of 9.4, T3 uptake of 46, and Free Thyroxine Index of 4.3 on 08/14/20. she denies dysphagia, choking, shortness of breath, no recent voice change.    she reports family history of thyroid dysfunction in her mother (unsure of what kind), but denies family hx of thyroid cancer. she does have personal history of goiter. she is not on any anti-thyroid medications nor on  any thyroid hormone supplements. Denies use of Biotin containing supplements (used to take it years ago but has not since).  she is willing to proceed with appropriate work up and therapy for thyrotoxicosis.   Review of systems  Constitutional: + Minimally fluctuating body weight, current Body mass index is 28.8 kg/m., no fatigue, no subjective hyperthermia, no subjective hypothermia Eyes: + blurry vision (having cataract surgery soon bilaterally), no xerophthalmia ENT: no sore throat, no nodules palpated in throat, no dysphagia/odynophagia, no hoarseness Cardiovascular: no chest pain, no shortness of breath, intermittent palpitations-continued but improved, + leg swelling Respiratory: no cough, no shortness of breath Gastrointestinal: no nausea/vomiting, intermittent diarrhea  Musculoskeletal: no muscle/joint aches, wc bound due to previous stroke Skin: no rashes, no hyperemia Neurological: mild intermittent tremors-improved, no numbness, no tingling, no dizziness Psychiatric: no depression, no anxiety, insomnia   Objective:    BP 140/87   Pulse 76   Ht '5\' 5"'$  (1.651 m)   BMI 28.80 kg/m   Wt Readings from Last 3 Encounters:  09/22/20 173 lb 1 oz (78.5 kg)  09/22/20 166 lb 0.1 oz (75.3 kg)  09/15/20 166 lb (75.3 kg)     BP Readings from Last 3 Encounters:  09/24/20 140/87  09/22/20 140/82  09/22/20 (!) 181/101                       Physical Exam- Limited  Constitutional:   Body mass index is 28.8 kg/m. , not in acute distress, normal state of mind Eyes:  EOMI, no exophthalmos Neck: Supple Thyroid: + gross goiter Cardiovascular: RRR, no murmers, rubs, or gallops, 2+ pitting edema to BLE Respiratory: Adequate breathing efforts, no crackles, rales, rhonchi, or wheezing Musculoskeletal: no gross deformities, strength intact in all four extremities, no gross restriction of joint movements, in wc due to previous CVA Skin:  no rashes, no hyperemia Neurological: no tremor with outstretched hands    CMP     Component Value Date/Time   NA 137 09/22/2020 1448   K 6.1 (H) 09/22/2020 1448   CL 100 09/22/2020 1448   CO2 25 09/22/2020 1448   GLUCOSE 67 (L) 09/22/2020 1448   BUN 61 (H) 09/22/2020 1448   CREATININE 9.77 (H) 09/22/2020 1448   CALCIUM 8.6 (L) 09/22/2020 1448   PROT 7.5 09/22/2020 1448   ALBUMIN 3.6 09/22/2020 1448   AST 9 (L) 09/22/2020 1448   ALT 14 09/22/2020 1448   ALKPHOS 136 (H) 09/22/2020 1448   BILITOT 0.7 09/22/2020 1448   GFRNONAA 5 (L) 09/22/2020 1448   GFRAA 7 (L) 01/16/2020 0617     CBC    Component Value Date/Time   WBC 7.6 09/22/2020 1448   RBC 3.48 (L) 09/22/2020 1448   HGB 10.8 (L) 09/22/2020 1448   HCT 35.0 (L) 09/22/2020 1448   PLT 338 09/22/2020 1448   MCV 100.6 (H) 09/22/2020 1448   MCH 31.0 09/22/2020 1448   MCHC 30.9 09/22/2020 1448   RDW 13.1 09/22/2020 1448   LYMPHSABS 2.3 09/22/2020 1448   MONOABS 0.6 09/22/2020 1448   EOSABS 0.5 09/22/2020 1448   BASOSABS 0.0 09/22/2020 1448     Diabetic Labs (most recent): Lab Results  Component Value Date   HGBA1C 7.1 (H) 01/13/2020   HGBA1C 6.2 (H) 02/08/2019    Lipid Panel  No results found for: CHOL, TRIG, HDL, CHOLHDL, VLDL, LDLCALC, LDLDIRECT, LABVLDL   Lab Results  Component Value Date   TSH 0.61 09/02/2020   TSH 0.20 (A)  08/14/2020    09/02/20 0000   Result status: Final  Resulting lab: [facility]  Reference range: 0.41 - 5.90  Value: 0.61   Comment: T4,Free 1.49, Anti-Thyroglobulin Antibodies <1.0   Uptake and Scan from 09/18/20 CLINICAL DATA:  Difficulty sleeping, hair loss, increased thirst, heart palpitations, fatigue, weakness, edema suppressed TSH, suspected hyperthyroidism  EXAM: THYROID SCAN AND UPTAKE - 4 AND 24 HOURS  TECHNIQUE: Following oral administration of I-123 capsule, anterior planar imaging was acquired at 24 hours. Thyroid uptake was calculated with a thyroid probe at 4-6 hours and 24 hours.  RADIOPHARMACEUTICALS:  310 uCi I-123 sodium iodide p.o.  COMPARISON:  None  FINDINGS: Homogeneous tracer distribution in both thyroid lobes.  No focal areas of increased or decreased tracer localization.  4 hour I-123 uptake = 4.3% (normal 5-20%)  24 hour I-123 uptake = 10.5% (normal 10-30%)  IMPRESSION: Normal thyroid scan.  Low normal 24 hour radio iodine uptake.   Electronically Signed   By: Lavonia Dana M.D.   On: 09/18/2020 11:03    Assessment & Plan:   1. Abnormal TSH  she is being seen at a kind request of [place], [place], DO.  her history and most recent labs are reviewed, and she was examined clinically. Her repeat labs show improvement to normal levels without intervention.  Her antibody testing was negative for autoimmune thyroid dysfunction and her uptake and scan was also normal (see above).  It is possible she had acute thyroiditis which contributed to her abnormal thyroid function tests which has resolved without further intervention over time. I do recommend rechecking thyroid function tests in 2 months for surveillance purposes.  I did not initiate any new prescriptions at today's appointment.  She is already on beta-blocker which may help with her symptoms.  She does have mild goiter and may benefit from thyroid ultrasound to assess anatomy as baseline.  Will consider ordering at next visit.  -Patient is advised to maintain close follow up with [place],  [place], DO for primary care needs.    I spent 21 minutes in the care of the patient today including review of labs from Thyroid Function, CMP, and other relevant labs ; imaging/biopsy records (current and previous including abstractions from other facilities); face-to-face time discussing  her lab results and symptoms, medications doses, her options of short and long term treatment based on the latest standards of care / guidelines;   and documenting the encounter.  [place]  participated in the discussions, expressed understanding, and voiced agreement with the above plans.  All questions were answered to her satisfaction. she is encouraged to contact clinic should she have any questions or concerns prior to her return visit.  Follow up plan: Return in about 2 months (around 11/24/2020) for Thyroid follow up, Previsit labs.   Thank you for involving me in the care of this pleasant patient, and I will continue to update you with her progress.  Rayetta Pigg, [facility] [address] [place], [place] [address] Phone: [phone] Fax: [phone]  09/24/2020, 10:42 AM

## 2020-09-25 ENCOUNTER — Other Ambulatory Visit: Payer: Self-pay | Admitting: Cardiology

## 2020-09-25 ENCOUNTER — Encounter: Payer: Self-pay | Admitting: Cardiology

## 2020-09-25 ENCOUNTER — Ambulatory Visit (INDEPENDENT_AMBULATORY_CARE_PROVIDER_SITE_OTHER): Payer: Medicare Other

## 2020-09-25 ENCOUNTER — Ambulatory Visit (INDEPENDENT_AMBULATORY_CARE_PROVIDER_SITE_OTHER): Payer: Medicare Other | Admitting: Cardiology

## 2020-09-25 ENCOUNTER — Telehealth: Payer: Self-pay | Admitting: Cardiology

## 2020-09-25 VITALS — BP 128/80 | HR 80 | Ht 65.0 in | Wt 177.4 lb

## 2020-09-25 DIAGNOSIS — R002 Palpitations: Secondary | ICD-10-CM

## 2020-09-25 NOTE — Patient Instructions (Signed)
Medication Instructions:  Continue all current medications.  Labwork: none  Testing/Procedures:  Your physician has recommended that you wear a 7 day event monitor. Event monitors are medical devices that record the heart's electrical activity. Doctors most often [place] these monitors to diagnose arrhythmias. Arrhythmias are problems with the speed or rhythm of the heartbeat. The monitor is a small, portable device. You can wear one while you do your normal daily activities. This is usually used to diagnose what is causing palpitations/syncope (passing out).  Office will contact with results via phone or letter.    Follow-Up: 2 months   Any Other Special Instructions Will Be Listed Below (If Applicable).  If you need a refill on your cardiac medications before your next appointment, please call your pharmacy.

## 2020-09-25 NOTE — Progress Notes (Signed)
Clinical Summary [name] Barnett is a 40 y.o.female seen today as a new consult, referred by Dr Jenna Luo for the following medical problems.   1. Palpitations - feeling of heart pounding, few times a week. Very often occurs at night. Can feel sweaty. Can last up to 30 minutes - no coffee, occasional mountain dews, occasional green tea, no energy drinks, no EtoH -   2. ESRD   3. History of CVA  4. Chronic diastolic HF - fluid managed by dialysis  5. CAD - prior notes mention NSTEMI, had PCI 01/2017 unclear details. Appears has had a stent to RCA and LAD   Past Medical History:  Diagnosis Date  . Diabetes mellitus without complication ([place])   . Hyperlipidemia   . Hypertension   . Renal disorder   . Stroke ([place])   . Vitamin D deficiency      Allergies  Allergen Reactions  . Corn-Containing Products     Per MAR  . Lisinopril     Per MAR  . Morphine And Related     Per [facility]     Current Outpatient Medications  Medication Sig Dispense Refill  . acetaminophen (TYLENOL) 325 MG tablet Take 650 mg by mouth every 6 (six) hours as needed.    Marland Kitchen amLODipine (NORVASC) 5 MG tablet Take 5 mg by mouth See admin instructions. Sun, Mon, Wed and Fri    . ascorbic acid (VITAMIN C) 500 MG tablet Take 500 mg by mouth 2 (two) times daily.    Marland Kitchen aspirin EC 81 MG tablet Take 81 mg by mouth daily. Swallow whole.    Marland Kitchen atenolol (TENORMIN) 25 MG tablet Take 0.5 tablets (12.5 mg total) by mouth daily. (Patient taking differently: Take 12.5 mg by mouth See admin instructions. Every Sun, Mon, Wed and Fri)    . atorvastatin (LIPITOR) 80 MG tablet Take 1 tablet by mouth at bedtime.    Marland Kitchen b complex-vitamin c-folic acid (NEPHRO-VITE) 0.8 MG TABS tablet Take 1 tablet by mouth daily.    . Biotin 5 MG TABS Take 5 mg by mouth in the morning and at bedtime.    . calcium carbonate (TUMS - DOSED IN MG ELEMENTAL CALCIUM) 500 MG chewable tablet Chew 1,000 mg by mouth 3 (three) times daily. Before meals     . cetaphil (CETAPHIL) lotion Apply 1 application topically 2 (two) times daily as needed for dry skin.    . Cholecalciferol (VITAMIN D) 50 MCG (2000 UT) CAPS Take 2,000 Units by mouth daily.    Marland Kitchen dicyclomine (BENTYL) 10 MG capsule Take 10 mg by mouth 3 (three) times daily before meals.    . insulin glargine (LANTUS) 100 UNIT/ML Solostar Pen Inject 10 Units into the skin in the morning.     . insulin lispro (HUMALOG) 100 UNIT/ML injection Inject 5 Units into the skin 3 (three) times daily before meals.    . Lidocaine 4 % PTCH Apply 1 patch topically daily. Remove at 2100    . lidocaine-prilocaine (EMLA) cream Apply 1 application topically daily as needed. Apply to HD graft site right arm topically one every tues, thurs, sat for pain.    . melatonin 3 MG TABS tablet Take 3 mg by mouth at bedtime.    . Multiple Vitamins-Minerals (CERTAGEN PO) Take 1 tablet by mouth in the morning.    . pantoprazole (PROTONIX) 40 MG tablet Take 40 mg by mouth in the morning.    . polyethylene glycol (MIRALAX / GLYCOLAX)  17 g packet Take 17 g by mouth daily.    Marland Kitchen rOPINIRole (REQUIP) 0.5 MG tablet Take 0.5 mg by mouth at bedtime.    Marland Kitchen rOPINIRole (REQUIP) 2 MG tablet Take 2 mg by mouth at bedtime.    . sennosides-docusate sodium (SENOKOT-S) 8.6-50 MG tablet Take 2 tablets by mouth daily.    Marland Kitchen thiamine 100 MG tablet Take 100 mg by mouth daily.    Marland Kitchen tiZANidine (ZANAFLEX) 4 MG tablet Take 4 mg by mouth at bedtime.    . traMADol (ULTRAM) 50 MG tablet Take 100 mg by mouth every 6 (six) hours as needed for severe pain.     No current facility-administered medications for this visit.     Past Surgical History:  Procedure Laterality Date  . cardiac stents    . CATARACT EXTRACTION W/PHACO Left 08/29/2020   Procedure: CATARACT EXTRACTION PHACO AND INTRAOCULAR LENS PLACEMENT LEFT EYE;  Surgeon: Baruch Goldmann, MD;  Location: [facility];  Service: Ophthalmology;  Laterality: Left;  left CDE=29.30  . COLOSTOMY    . wound on  buttocks       Allergies  Allergen Reactions  . Corn-Containing Products     Per MAR  . Lisinopril     Per MAR  . Morphine And Related     Per MAR      Family History  Problem Relation Age of Onset  . Cancer Paternal Grandmother   . Heart disease Father   . Hypertension Father   . Diabetes Father   . Glaucoma Father   . Heart disease Mother   . Hypertension Mother   . Diabetes Mother   . Diabetes Sister        borderline  . Hypertension Sister      Social History Ms. Oakley Lasek reports that she has never smoked. She has never used smokeless tobacco. Ms. Tirah Dassow reports no history of alcohol use.   Review of Systems CONSTITUTIONAL: No weight loss, fever, chills, weakness or fatigue.  HEENT: Eyes: No visual loss, blurred vision, double vision or yellow sclerae.No hearing loss, sneezing, congestion, runny nose or sore throat.  SKIN: No rash or itching.  CARDIOVASCULAR: per hpi RESPIRATORY: No shortness of breath, cough or sputum.  GASTROINTESTINAL: No anorexia, nausea, vomiting or diarrhea. No abdominal pain or blood.  GENITOURINARY: No burning on urination, no polyuria NEUROLOGICAL: No headache, dizziness, syncope, paralysis, ataxia, numbness or tingling in the extremities. No change in bowel or bladder control.  MUSCULOSKELETAL: No muscle, back pain, joint pain or stiffness.  LYMPHATICS: No enlarged nodes. No history of splenectomy.  PSYCHIATRIC: No history of depression or anxiety.  ENDOCRINOLOGIC: No reports of sweating, cold or heat intolerance. No polyuria or polydipsia.  Marland Kitchen   Physical Examination Today's Vitals   09/25/20 0857  BP: 128/80  Pulse: 80  Weight: 177 lb 6.4 oz (80.5 kg)  Height: '5\' 5"'$  (1.651 m)   Body mass index is 29.52 kg/m.  Gen: resting comfortably, no acute distress HEENT: no scleral icterus, pupils equal round and reactive, no palptable cervical adenopathy,  CV: RRR, no m/r/g, no jvd Resp: Clear to auscultation  bilaterally GI: abdomen is soft, non-tender, non-distended, normal bowel sounds, no hepatosplenomegaly MSK: extremities are warm, no edema.  Skin: warm, no rash Neuro:  no focal deficits Psych: appropriate affect   Diagnostic Studies  [facility] 02/02/2017 Coronary arteries Dominance: right Left main: normal LAD: insignificant LCx: ramus 99% - medium; OM2 70% - medium RCA: insignificant RA: 14 mmHg (mean) RV:  62/ 14 mmHg PA: 62/ 25 42 mmHg (mean) PCW: 25 mmHg (mean) AV O2: 4.6 vol% Cardiac output: 5.0 L/min Cardiac index: 2.7 L/min-m2 PVR: 3.4 Wood units Impressions Elevated right and left sided filling pressures with a preserved cardiac index Significant systemic hypertension in 190-200 mm Hg range Patent stents in the mid LAD and mid RCA Total occlusion of the ramus with faint collaterals Distal small vessel disease in the circumflex and small PL system 20 ml of contrast used with biplane angiography Findings discussed with primary team Recommendations Continue medical therapy for distal small vessel CAD and diuresis/BP control for HFpEF     Assessment and Plan  1. Palpitations - EKG today shows NSR - we will plan for a 7 day zio patch for further evaluation      Arnoldo Lenis, M.D.

## 2020-09-25 NOTE — Telephone Encounter (Signed)
PERCERT   Long Term Monitor

## 2020-10-01 DIAGNOSIS — R002 Palpitations: Secondary | ICD-10-CM

## 2020-10-27 ENCOUNTER — Ambulatory Visit: Payer: Self-pay

## 2020-10-27 ENCOUNTER — Encounter: Payer: Self-pay | Admitting: Specialist

## 2020-10-27 ENCOUNTER — Ambulatory Visit (INDEPENDENT_AMBULATORY_CARE_PROVIDER_SITE_OTHER): Payer: Medicare Other | Admitting: Specialist

## 2020-10-27 VITALS — BP 121/83 | HR 76 | Ht 65.0 in | Wt 166.0 lb

## 2020-10-27 DIAGNOSIS — M21372 Foot drop, left foot: Secondary | ICD-10-CM

## 2020-10-27 DIAGNOSIS — M545 Low back pain, unspecified: Secondary | ICD-10-CM

## 2020-10-27 DIAGNOSIS — R29898 Other symptoms and signs involving the musculoskeletal system: Secondary | ICD-10-CM

## 2020-10-27 DIAGNOSIS — I69354 Hemiplegia and hemiparesis following cerebral infarction affecting left non-dominant side: Secondary | ICD-10-CM

## 2020-10-27 DIAGNOSIS — M4649 Discitis, unspecified, multiple sites in spine: Secondary | ICD-10-CM

## 2020-10-27 DIAGNOSIS — I2510 Atherosclerotic heart disease of native coronary artery without angina pectoris: Secondary | ICD-10-CM

## 2020-10-27 DIAGNOSIS — M4726 Other spondylosis with radiculopathy, lumbar region: Secondary | ICD-10-CM

## 2020-10-27 DIAGNOSIS — G8929 Other chronic pain: Secondary | ICD-10-CM

## 2020-10-27 NOTE — Patient Instructions (Signed)
Avoid prolong standing and walking. Avoid frequent bending and stooping  No lifting greater than 10 lbs. May use ice or moist heat for pain. Weight loss is of benefit. Handicap license is approved. Physical therapy ordered see RX to be done in SNF if not available then can be done at [facility]. Needs AFO for left leg to prevent equinus deformity.  Will request a walker with a seat. The neurologists secretary/Assistant will call to arrange for testing of the left leg for any signs of nerve  Compression in the lumbar spine or if the weakness in the left leg is due to previous stroke.

## 2020-10-27 NOTE — Progress Notes (Signed)
Office Visit Note   Patient: [name] Barnett           Date of Birth: [birth year]           MRN: [id number] Visit Date: 10/27/2020              Requested by: Julianne Handler, DO [address] [place],  [place] [address] PCP: Julianne Handler, DO   Assessment & Plan: Visit Diagnoses:  1. Discitis of multiple sites of spine   2. Leg weakness, bilateral   3. Chronic low back pain, unspecified back pain laterality, unspecified whether sciatica present   4. Hemiparesis affecting left side as late effect of cerebrovascular accident (CVA) ([place])   5. Other spondylosis with radiculopathy, lumbar region   6. Foot drop, left     Plan: Avoid bending, stooping and avoid lifting weights greater than 10 lbs. Avoid prolong standing and walking. Avoid frequent bending and stooping  No lifting greater than 10 lbs. May use ice or moist heat for pain. Weight loss is of benefit. Handicap license is approved. Will request a walker with a seat. The neurologists secretary/Assistant will call to arrange for testing of the left leg for any signs of nerve  Compression in the lumbar spine or if the weakness in the left leg is due to previous stroke.   Follow-Up Instructions: No follow-ups on file.   Orders:  Orders Placed This Encounter  Procedures   XR Lumbar Spine 2-3 Views   Ambulatory referral to Neurology   Ambulatory referral to Physical Therapy   PT orthosis to lower extremity   No orders of the defined types were placed in this encounter.     Procedures: No procedures performed   Clinical Data: No additional findings.   Subjective: Chief Complaint  Patient presents with   Lower Back - Follow-up    40 year old female with history of sepsis due to L4-5 discitis and complications of kidney failure and CVA. She is in a SNF in [place], [place] though she lives in [place], [place] but is at [facility] for  Ongoing assisted living and rehab. I have been  seeing her in follow up of the Lumbar discitis. MRI 09/03/2020 shows relatively quiecent disc sequelae of  Discitis with severe disc degeneration, also DDD L3-4 and L5-S1. She has weakness left leg with foot drop. Dialysis Sat, Tues and Thursday.    Review of Systems  Constitutional: Negative.   HENT: Negative.    Eyes: Negative.   Respiratory: Negative.    Cardiovascular: Negative.   Gastrointestinal: Negative.   Endocrine: Negative.   Genitourinary: Negative.   Musculoskeletal: Negative.   Skin: Negative.   Allergic/Immunologic: Negative.   Neurological: Negative.   Hematological: Negative.   Psychiatric/Behavioral: Negative.      Objective: Vital Signs: BP 121/83 (BP Location: Left Arm, Patient Position: Sitting)   Pulse 76   Ht '5\' 5"'$  (1.651 m)   Wt 166 lb (75.3 kg)   BMI 27.62 kg/m   Physical Exam Constitutional:      Appearance: She is well-developed.  HENT:     Head: Normocephalic and atraumatic.  Eyes:     Pupils: Pupils are equal, round, and reactive to light.  Pulmonary:     Effort: Pulmonary effort is normal.     Breath sounds: Normal breath sounds.  Abdominal:     General: Bowel sounds are normal.     Palpations: Abdomen is soft.  Musculoskeletal:     Cervical  back: Normal range of motion and neck supple.     Lumbar back: Negative right straight leg raise test and negative left straight leg raise test.  Skin:    General: Skin is warm and dry.  Neurological:     Mental Status: She is alert and oriented to person, place, and time.  Psychiatric:        Behavior: Behavior normal.        Thought Content: Thought content normal.        Judgment: Judgment normal.   Back Exam   Tenderness  The patient is experiencing tenderness in the lumbar.  Range of Motion  Extension:  abnormal  Flexion:  abnormal  Lateral bend right:  abnormal  Lateral bend left:  abnormal  Rotation right:  abnormal  Rotation left:  abnormal   Muscle Strength  Right  Quadriceps:  5/5  Left Quadriceps:  5/5  Right Hamstrings:  5/5  Left Hamstrings:  5/5   Tests  Straight leg raise right: negative Straight leg raise left: negative  Other  Toe walk: normal Heel walk: normal Sensation: normal Gait: circumducted  Erythema: no back redness Scars: absent  Comments:  Left foot drop. Sequelae of CVA vs lumbar radiculopathy.     Specialty Comments:  No specialty comments available.  Imaging: No results found.   PMFS History: Patient Active Problem List   Diagnosis Date Noted   BV (bacterial vaginosis) 04/02/2020   Vaginal itching 04/02/2020   Vaginal odor 04/02/2020   Vaginal discharge 04/02/2020   Sepsis secondary to UTI ([place]) 01/13/2020   Acute respiratory failure with hypoxia ([place]) 01/13/2020   Right leg swelling 01/13/2020   Sacral decubitus ulcer 01/13/2020   Anemia due to end stage renal disease ([place]) 01/13/2020   Essential hypertension 01/13/2020   Hyperlipidemia 01/13/2020   Type 2 diabetes mellitus ([place]) 01/13/2020   Past Medical History:  Diagnosis Date   Diabetes mellitus without complication ([place])    Hyperlipidemia    Hypertension    Renal disorder    Stroke ([place])    Vitamin D deficiency     Family History  Problem Relation Age of Onset   Cancer Paternal Grandmother    Heart disease Father    Hypertension Father    Diabetes Father    Glaucoma Father    Heart disease Mother    Hypertension Mother    Diabetes Mother    Diabetes Sister        borderline   Hypertension Sister     Past Surgical History:  Procedure Laterality Date   cardiac stents     CATARACT EXTRACTION W/PHACO Left 08/29/2020   Procedure: CATARACT EXTRACTION PHACO AND INTRAOCULAR LENS PLACEMENT LEFT EYE;  Surgeon: Baruch Goldmann, MD;  Location: [facility];  Service: Ophthalmology;  Laterality: Left;  left CDE=29.30   COLOSTOMY     TUBAL LIGATION     wound on buttocks     Social History   Occupational History   Not on file  Tobacco Use    Smoking status: Never   Smokeless tobacco: Never  Vaping Use   Vaping Use: Never used  Substance and Sexual Activity   Alcohol use: Never   Drug use: Never   Sexual activity: Not Currently    Birth control/protection: Surgical    Comment: tubal

## 2020-11-14 ENCOUNTER — Ambulatory Visit (HOSPITAL_COMMUNITY): Payer: Medicare Other | Attending: Specialist

## 2020-11-14 ENCOUNTER — Other Ambulatory Visit: Payer: Self-pay

## 2020-11-14 ENCOUNTER — Encounter (HOSPITAL_COMMUNITY): Payer: Self-pay

## 2020-11-14 DIAGNOSIS — R262 Difficulty in walking, not elsewhere classified: Secondary | ICD-10-CM | POA: Insufficient documentation

## 2020-11-14 NOTE — Therapy (Signed)
[facility] [place], [place], [address] Phone: [phone]   Fax:  [phone]  Patient Details  Name: [name] Barnett MRN: [id number] Date of Birth: Aug 21, 1980 Referring Provider:  [name], MD  Encounter Date: 11/14/2020  CVA in 2020 which affected left side.Patient is currently an inpatient resident at [facility] where she has been since December 2020. Pt had been participating with therapy at the facility and reports they recently resumed PT services 11/10/20. Pt reports she does not currently have a D/C date from the facility and remains there as a LTC resident.  As patient is still an inpatient resident of SNF she would not be eligible for therapy services at this community, outpatient rehabilitation facility.  12:15 PM, 11/14/20 M. Sherlyn Lees, PT, DPT Physical Therapist- [place] Office Number: [phone]   [facility] [address] [place], [place], [address] Phone: [phone]   Fax:  [phone]

## 2020-11-24 NOTE — H&P (Signed)
Surgical History & Physical  Patient Name: [name] Barnett DOB: [birth year]  Surgery: Cataract extraction with intraocular lens implant phacoemulsification; Right Eye  Surgeon: Baruch Goldmann MD Surgery Date:  11/28/2020 Pre-Op Date:  11/20/2020  HPI: A 45 Yr. old female patient PO OS/Pre Op OD The patient is returning after cataract surgery. The left eye is affected. Status post cataract surgery, which began 2 week ago: Since the last visit, the affected area is doing well. The patient's vision is stable. Patient is following medication instructions. Denies any increase in floaters/flashes of light. The patient complains of difficulty when viewing TV, reading closed caption, news scrolls on TV, which began 1 year ago. The right eye is affected. The episode is gradual. The condition's severity increased since last visit. Symptoms occur when the patient is inside and outside. This is negatively affecting her quality of life. HPI was performed by Baruch Goldmann .  Medical History: Cataracts Depressive disorder, Anxiety,Gastroesophageal refl... Diabetes Heart Problem Thyroid Problems  Review of Systems Negative Allergic/Immunologic Negative Cardiovascular Negative Constitutional Negative Ear, Nose, Mouth & Throat Negative Endocrine Negative Eyes Negative Gastrointestinal Negative Genitourinary Negative Hemotologic/Lymphatic Negative Integumentary Negative Musculoskeletal Negative Neurological Negative Psychiatry Negative Respiratory  Social   Never smoked Cigarettes   Medication Ilevro, Moxifloxacin,  Amiloride-HCTZ, Atorvastatin, Glycolax, Hydrocodone, Juven, Nephro-vite, Ropinrole hcl, Senna, Zanaflex, Lantus, Subcutaneous, NovoLog, Subcutaneous, NovoLog, Subcutaneous, Bentyl, Pantoprazole,   Sx/Procedures Retina repair, Phaco c IOL OS,  Colectomy, Stent in heart x 2, Knee, Wrist Sx,   Drug Allergies  Lisinopril, Morphine,   History & Physical: Heent:  Cataract,  Right eye NECK: supple without bruits LUNGS: lungs clear to auscultation CV: regular rate and rhythm Abdomen: soft and non-tender  Impression & Plan: Assessment: 1.  CATARACT EXTRACTION STATUS; Left Eye (Z98.42) 2.  COMBINED FORMS AGE RELATED CATARACT; Right Eye (H25.811) 3.  PCO; Left Eye (H26.492) 4.  BLEPHARITIS; Right Upper Lid, Right Lower Lid, Left Upper Lid, Left Lower Lid (H01.001, H01.002,H01.004,H01.005)  Plan: 1.  1 week after cataract surgery. Doing well with improved vision and normal eye pressure. Call with any problems or concerns. Continue Pred-Moxi-Brom 2x/day for 3 more weeks.  2.  Cataract accounts for the patient's decreased vision. This visual impairment is not correctable with a tolerable change in glasses or contact lenses. Cataract surgery with an implantation of a new lens should significantly improve the visual and functional status of the patient. Discussed all risks, benefits, alternatives, and potential complications. Discussed the procedures and recovery. Patient desires to have surgery. A-scan ordered and performed today for intra-ocular lens calculations. The surgery will be performed in order to improve vision for driving, reading, and for eye examinations. Recommend phacoemulsification with intra-ocular lens. Recommend Dextenza for post-operative pain and inflammation. Right Eye. Surgery required to correct imbalance of vision. Dilates poorly - shugacaine by protocol. Malyugin Ring. Omidira.  3.  Asymptomatic. Findings, prognosis and treatment options reviewed. No indication for laser at this point, will observe for changes.  4.  recommend regular lid cleaning. Warm compresses 7-10 minutes every day, both eyes.

## 2020-11-25 ENCOUNTER — Ambulatory Visit: Payer: Medicare Other | Admitting: Nurse Practitioner

## 2020-11-26 ENCOUNTER — Encounter (HOSPITAL_COMMUNITY): Payer: Self-pay

## 2020-11-26 ENCOUNTER — Encounter (HOSPITAL_COMMUNITY)
Admission: RE | Admit: 2020-11-26 | Discharge: 2020-11-26 | Disposition: A | Discharge status: Home / Self Care | Payer: Medicare Other | Source: Ambulatory Visit | Attending: Ophthalmology | Admitting: Ophthalmology

## 2020-11-26 ENCOUNTER — Other Ambulatory Visit: Payer: Self-pay

## 2020-11-26 NOTE — Pre-Procedure Instructions (Signed)
Patient resides at [facility]. I called and spoke with Louretta Shorten, supervisor concerning arrival time and all other preop information. She verbalized understanding and had nor questions.

## 2020-11-28 ENCOUNTER — Encounter (HOSPITAL_COMMUNITY)
Admission: RE | Disposition: A | Discharge status: Skilled Nursing Facility | Payer: Self-pay | Source: Home / Self Care | Attending: Ophthalmology

## 2020-11-28 ENCOUNTER — Ambulatory Visit (HOSPITAL_COMMUNITY): Payer: Medicare Other | Admitting: Anesthesiology

## 2020-11-28 ENCOUNTER — Ambulatory Visit (HOSPITAL_COMMUNITY)
Admission: RE | Admit: 2020-11-28 | Discharge: 2020-11-28 | Disposition: A | Discharge status: Skilled Nursing Facility | Payer: Medicare Other | Attending: Ophthalmology | Admitting: Ophthalmology

## 2020-11-28 ENCOUNTER — Encounter (HOSPITAL_COMMUNITY): Payer: Self-pay | Admitting: Ophthalmology

## 2020-11-28 DIAGNOSIS — E1136 Type 2 diabetes mellitus with diabetic cataract: Secondary | ICD-10-CM | POA: Insufficient documentation

## 2020-11-28 DIAGNOSIS — Z8673 Personal history of transient ischemic attack (TIA), and cerebral infarction without residual deficits: Secondary | ICD-10-CM | POA: Insufficient documentation

## 2020-11-28 DIAGNOSIS — Z9049 Acquired absence of other specified parts of digestive tract: Secondary | ICD-10-CM | POA: Insufficient documentation

## 2020-11-28 DIAGNOSIS — Z79899 Other long term (current) drug therapy: Secondary | ICD-10-CM | POA: Insufficient documentation

## 2020-11-28 DIAGNOSIS — H2181 Floppy iris syndrome: Secondary | ICD-10-CM | POA: Insufficient documentation

## 2020-11-28 DIAGNOSIS — Z885 Allergy status to narcotic agent status: Secondary | ICD-10-CM | POA: Diagnosis not present

## 2020-11-28 DIAGNOSIS — H25811 Combined forms of age-related cataract, right eye: Secondary | ICD-10-CM | POA: Diagnosis not present

## 2020-11-28 DIAGNOSIS — Z9842 Cataract extraction status, left eye: Secondary | ICD-10-CM | POA: Insufficient documentation

## 2020-11-28 DIAGNOSIS — H0100A Unspecified blepharitis right eye, upper and lower eyelids: Secondary | ICD-10-CM | POA: Insufficient documentation

## 2020-11-28 DIAGNOSIS — Z888 Allergy status to other drugs, medicaments and biological substances status: Secondary | ICD-10-CM | POA: Insufficient documentation

## 2020-11-28 DIAGNOSIS — H26492 Other secondary cataract, left eye: Secondary | ICD-10-CM | POA: Insufficient documentation

## 2020-11-28 DIAGNOSIS — Z794 Long term (current) use of insulin: Secondary | ICD-10-CM | POA: Insufficient documentation

## 2020-11-28 DIAGNOSIS — H0100B Unspecified blepharitis left eye, upper and lower eyelids: Secondary | ICD-10-CM | POA: Diagnosis not present

## 2020-11-28 DIAGNOSIS — Z955 Presence of coronary angioplasty implant and graft: Secondary | ICD-10-CM | POA: Diagnosis not present

## 2020-11-28 HISTORY — PX: CATARACT EXTRACTION W/PHACO: SHX586

## 2020-11-28 LAB — POCT I-STAT, CHEM 8
BUN: 52 mg/dL — ABNORMAL HIGH (ref 6–20)
Calcium, Ion: 0.97 mmol/L — ABNORMAL LOW (ref 1.15–1.40)
Chloride: 101 mmol/L (ref 98–111)
Creatinine, Ser: 7.5 mg/dL — ABNORMAL HIGH (ref 0.44–1.00)
Glucose, Bld: 147 mg/dL — ABNORMAL HIGH (ref 70–99)
HCT: 37 % (ref 36.0–46.0)
Hemoglobin: 12.6 g/dL (ref 12.0–15.0)
Potassium: 5.6 mmol/L — ABNORMAL HIGH (ref 3.5–5.1)
Sodium: 137 mmol/L (ref 135–145)
TCO2: 29 mmol/L (ref 22–32)

## 2020-11-28 SURGERY — PHACOEMULSIFICATION, CATARACT, WITH IOL INSERTION
Anesthesia: Monitor Anesthesia Care | Site: Eye | Laterality: Right

## 2020-11-28 MED ORDER — TETRACAINE HCL 0.5 % OP SOLN
1.0000 [drp] | OPHTHALMIC | Status: AC | PRN
  Administered 2020-11-28 (×3): 1 [drp] via OPHTHALMIC

## 2020-11-28 MED ORDER — PHENYLEPHRINE HCL 2.5 % OP SOLN
1.0000 [drp] | OPHTHALMIC | Status: AC | PRN
  Administered 2020-11-28 (×3): 1 [drp] via OPHTHALMIC

## 2020-11-28 MED ORDER — LIDOCAINE HCL 3.5 % OP GEL
1.0000 "application " | Freq: Once | OPHTHALMIC | Status: AC
  Administered 2020-11-28: 1 via OPHTHALMIC

## 2020-11-28 MED ORDER — TROPICAMIDE 1 % OP SOLN
1.0000 [drp] | OPHTHALMIC | Status: AC
  Administered 2020-11-28 (×3): 1 [drp] via OPHTHALMIC

## 2020-11-28 MED ORDER — TRYPAN BLUE 0.06 % OP SOLN
OPHTHALMIC | Status: AC
  Filled 2020-11-28: qty 0.5

## 2020-11-28 MED ORDER — SODIUM CHLORIDE 0.9 % IV SOLN: INTRAVENOUS | Status: DC

## 2020-11-28 MED ORDER — PHENYLEPHRINE-KETOROLAC 1-0.3 % IO SOLN
INTRAOCULAR | Status: AC
  Filled 2020-11-28: qty 4

## 2020-11-28 MED ORDER — STERILE WATER FOR IRRIGATION IR SOLN
Status: DC | PRN
  Administered 2020-11-28: 250 mL

## 2020-11-28 MED ORDER — SODIUM HYALURONATE 10 MG/ML IO SOLUTION
PREFILLED_SYRINGE | INTRAOCULAR | Status: DC | PRN
  Administered 2020-11-28: 0.85 mL via INTRAOCULAR

## 2020-11-28 MED ORDER — SODIUM HYALURONATE 23MG/ML IO SOSY
PREFILLED_SYRINGE | INTRAOCULAR | Status: DC | PRN
  Administered 2020-11-28: 0.6 mL via INTRAOCULAR

## 2020-11-28 MED ORDER — PHENYLEPHRINE-KETOROLAC 1-0.3 % IO SOLN
INTRAOCULAR | Status: DC | PRN
  Administered 2020-11-28: 500 mL via OPHTHALMIC

## 2020-11-28 MED ORDER — LIDOCAINE HCL (PF) 1 % IJ SOLN
INTRAOCULAR | Status: DC | PRN
  Administered 2020-11-28: 1 mL via OPHTHALMIC

## 2020-11-28 MED ORDER — MIDAZOLAM HCL 5 MG/5ML IJ SOLN
INTRAMUSCULAR | Status: DC | PRN
  Administered 2020-11-28: 1 mg via INTRAVENOUS

## 2020-11-28 MED ORDER — NEOMYCIN-POLYMYXIN-DEXAMETH 3.5-10000-0.1 OP SUSP
OPHTHALMIC | Status: DC | PRN
  Administered 2020-11-28: 1 [drp] via OPHTHALMIC

## 2020-11-28 MED ORDER — SODIUM CHLORIDE 0.9% FLUSH
INTRAVENOUS | Status: DC | PRN
  Administered 2020-11-28: 5 mL via INTRAVENOUS

## 2020-11-28 MED ORDER — POVIDONE-IODINE 5 % OP SOLN
OPHTHALMIC | Status: DC | PRN
  Administered 2020-11-28: 1 via OPHTHALMIC

## 2020-11-28 MED ORDER — MIDAZOLAM HCL 2 MG/2ML IJ SOLN
INTRAMUSCULAR | Status: AC
  Filled 2020-11-28: qty 2

## 2020-11-28 MED ORDER — BSS IO SOLN
INTRAOCULAR | Status: DC | PRN
  Administered 2020-11-28: 15 mL via INTRAOCULAR

## 2020-11-28 MED ORDER — EPINEPHRINE PF 1 MG/ML IJ SOLN
INTRAMUSCULAR | Status: AC
  Filled 2020-11-28: qty 1

## 2020-11-28 SURGICAL SUPPLY — 11 items
CLOTH BEACON ORANGE TIMEOUT ST (SAFETY) ×2 IMPLANT
EYE SHIELD UNIVERSAL CLEAR (GAUZE/BANDAGES/DRESSINGS) ×2 IMPLANT
GLOVE SURG UNDER POLY LF SZ7 (GLOVE) ×4 IMPLANT
NEEDLE HYPO 18GX1.5 BLUNT FILL (NEEDLE) ×2 IMPLANT
PAD ARMBOARD 7.5X6 YLW CONV (MISCELLANEOUS) ×2 IMPLANT
RING MALYGIN 7.0 (MISCELLANEOUS) IMPLANT
SYR TB 1ML LL NO SAFETY (SYRINGE) ×2 IMPLANT
TAPE SURG TRANSPORE 1 IN (GAUZE/BANDAGES/DRESSINGS) ×1 IMPLANT
TAPE SURGICAL TRANSPORE 1 IN (GAUZE/BANDAGES/DRESSINGS) ×1
TECHNIS 1-PIECE IO; (Intraocular Lens) ×2 IMPLANT
WATER STERILE IRR 250ML POUR (IV SOLUTION) ×2 IMPLANT

## 2020-11-28 NOTE — Anesthesia Preprocedure Evaluation (Signed)
Anesthesia Evaluation  Patient identified by MRN, date of birth, ID band Patient awake    Reviewed: Allergy & Precautions, H&P , NPO status , Patient's Chart, lab work & pertinent test results, reviewed documented beta blocker date and time   Airway Mallampati: II  TM Distance: >3 FB Neck ROM: full    Dental no notable dental hx.    Pulmonary neg pulmonary ROS,    Pulmonary exam normal breath sounds clear to auscultation       Cardiovascular Exercise Tolerance: Good hypertension, + CAD and + Cardiac Stents   Rhythm:regular Rate:Normal     Neuro/Psych CVA negative psych ROS   GI/Hepatic negative GI ROS, Neg liver ROS,   Endo/Other  negative endocrine ROSdiabetes  Renal/GU ESRF and DialysisRenal disease  negative genitourinary   Musculoskeletal   Abdominal   Peds  Hematology  (+) Blood dyscrasia, anemia ,   Anesthesia Other Findings   Reproductive/Obstetrics negative OB ROS                             Anesthesia Physical Anesthesia Plan  ASA: 3  Anesthesia Plan: MAC   Post-op Pain Management:    Induction:   PONV Risk Score and Plan:   Airway Management Planned:   Additional Equipment:   Intra-op Plan:   Post-operative Plan:   Informed Consent: I have reviewed the patients History and Physical, chart, labs and discussed the procedure including the risks, benefits and alternatives for the proposed anesthesia with the patient or authorized representative who has indicated his/her understanding and acceptance.     Dental Advisory Given  Plan Discussed with: CRNA  Anesthesia Plan Comments:         Anesthesia Quick Evaluation

## 2020-11-28 NOTE — Interval H&P Note (Signed)
History and Physical Interval Note:  11/28/2020 9:19 AM  [name] Barnett  has presented today for surgery, with the diagnosis of Nuclear sclerotic cataract - Right eye.  The various methods of treatment have been discussed with the patient and family. After consideration of risks, benefits and other options for treatment, the patient has consented to  Procedure(s) with comments: CATARACT EXTRACTION PHACO AND INTRAOCULAR LENS PLACEMENT (I[facility]) (Right) - facility pt, - right as a surgical intervention.  The patient's history has been reviewed, patient examined, no change in status, stable for surgery.  I have reviewed the patient's chart and labs.  Questions were answered to the patient's satisfaction.     Baruch Goldmann

## 2020-11-28 NOTE — Transfer of Care (Signed)
Immediate Anesthesia Transfer of Care Note  Patient: [name] Barnett  Procedure(s) Performed: CATARACT EXTRACTION PHACO AND INTRAOCULAR LENS PLACEMENT ([facility]) (Right: Eye)  Patient Location: Short Stay  Anesthesia Type:MAC  Level of Consciousness: awake  Airway & Oxygen Therapy: Patient Spontanous Breathing  Post-op Assessment: Report given to RN  Post vital signs: Reviewed  Last Vitals:  Vitals Value Taken Time  BP    Temp    Pulse    Resp    SpO2      Last Pain:  Vitals:   11/28/20 0912  TempSrc: Oral  PainSc: 0-No pain         Complications: No notable events documented.

## 2020-11-28 NOTE — Anesthesia Postprocedure Evaluation (Signed)
Anesthesia Post Note  Patient: [name] Barnett  Procedure(s) Performed: CATARACT EXTRACTION PHACO AND INTRAOCULAR LENS PLACEMENT (I[facility]) (Right: Eye)  Patient location during evaluation: Short Stay Anesthesia Type: MAC Level of consciousness: awake and alert Pain management: pain level controlled Vital Signs Assessment: post-procedure vital signs reviewed and stable Respiratory status: spontaneous breathing Cardiovascular status: blood pressure returned to baseline and stable Postop Assessment: no apparent nausea or vomiting Anesthetic complications: no   No notable events documented.   Last Vitals:  Vitals:   11/28/20 0912  BP: 120/82  Pulse: 66  Resp: 18  Temp: 36.7 C  SpO2: 98%    Last Pain:  Vitals:   11/28/20 0912  TempSrc: Oral  PainSc: 0-No pain                 Elizbeth Posa

## 2020-11-28 NOTE — Discharge Instructions (Addendum)
Please discharge patient when stable, will follow up today with Dr. Wrzosek at the [facility] [place] office immediately following discharge.  Leave shield in place until visit.  All paperwork with discharge instructions will be given at the office.   [facility] Address:  [address]  [place], [place] [address]  

## 2020-11-28 NOTE — Op Note (Signed)
Date of procedure: 11/28/20  Pre-operative diagnosis: Visually significant combined-form age-related cataract, Right Eye; Poor Dilation, Right Eye (H25.811; H21.81)  Post-operative diagnosis: Visually significant cataract, Right Eye; Intra-operative Floppy Iris Syndrome, Right Eye  Procedure: Removal of cataract via phacoemulsification and insertion of intra-ocular lens [employer] [id number]  +22.5D into the capsular bag of the Right Eye (CPT [phone])  Attending surgeon: Gerda Diss. Tyse Auriemma, MD, MA  Anesthesia: MAC, Topical [place]  Complications: None  Estimated Blood Loss: <69m (minimal)  Specimens: None  Implants: As above  Indications:  Visually significant cataract, Right Eye  Procedure:  The patient was seen and identified in the pre-operative area. The operative eye was identified and dilated.  The operative eye was marked.  Topical anesthesia was administered to the operative eye.     The patient was then to the operative suite and placed in the supine position.  A timeout was performed confirming the patient, procedure to be performed, and all other relevant information.   The patient's face was prepped and draped in the usual fashion for intra-ocular surgery.  A lid speculum was placed into the operative eye and the surgical microscope moved into place and focused.  Poor dilation of the iris was confirmed.  A superotemporal paracentesis was created using a 20 gauge paracentesis blade.  Shugarcaine was injected into the anterior chamber.  Viscoelastic was injected into the anterior chamber.  A temporal clear-corneal main wound incision was created using a 2.459mmicrokeratome.  A Malyugin ring was placed.  A continuous curvilinear capsulorrhexis was initiated using an irrigating cystitome and completed using capsulorrhexis forceps.  Hydrodissection and hydrodeliniation were performed.  Viscoelastic was injected into the anterior chamber.  A phacoemulsification handpiece and a  chopper as a second instrument were used to remove the nucleus and epinucleus. The irrigation/aspiration handpiece was used to remove any remaining cortical material.   The capsular bag was reinflated with viscoelastic, checked, and found to be intact.  The intraocular lens was inserted into the capsular bag and dialed into place using a MaSurveyor, minerals The Malyugin ring was removed.  The irrigation/aspiration handpiece was used to remove any remaining viscoelastic.  The clear corneal wound and paracentesis wounds were then hydrated and checked with Weck-Cels to be watertight.  The lid-speculum and drape was removed, and the patient's face was cleaned with a wet and dry 4x4.  Maxitrol was instilled in the eye before a clear shield was taped over the eye. The patient was taken to the post-operative care unit in good condition, having tolerated the procedure well.  Post-Op Instructions: [facility] and will receive all other orders and instructions.

## 2020-11-30 NOTE — Progress Notes (Signed)
Cardiology Office Note  Date: 12/01/2020   ID: [name] Barnett, DOB 11-27-1980, MRN [id number]  PCP:  [name] Handler, DO  Cardiologist:  [name] Dolly, MD Electrophysiologist:  None   Chief Complaint: 2 month follow up  History of Present Illness: [name] Barnett is a 40 y.o. female with a history of palpitations, HLD, HTN, CVA, DM2, ESRD, chronic diastolic heart failure, CAD previous PCI 2018.  Previous stents to RCA and LAD.  She was last seen by Dr. Harl Bowie on 09/25/2020.  She described feeling of heart pounding a few times a week occurring often at night.  Could feel sweaty.  Palpitations could last up to 30 minutes.  EKG showed normal sinus rhythm.  Plan was for 7-day Zio patch for further evaluation.  She is here for follow-up today status post recent cardiac monitor.  Cardiac monitor showed no significant arrhythmias. She had rare supraventricular ectopy, rare episodes of SVT up to 8 beats.  Rare ventricular ectopy with no symptoms reported.  Today she is complaining of back pain and abdominal pain and left lower quadrant.  She states she was nauseated and vomited this morning after she took her blood pressure medications.  This may explain her elevated blood pressure today.  She currently complains of nausea and abdominal pain.  States her back pain is chronic.  She states she feels sweaty.  She denies any anginal symptoms or shortness of breath.  Denies any orthostatic symptoms.  States she has had no further palpitations.  Denies any CVA or TIA-like symptoms.  She has ESRD on dialysis on Tuesdays Thursdays and Saturdays.  EKG today shows normal sinus rhythm rate of 79, anterior infarct, age undetermined.  I attempted to recheck her blood pressure again which was at 140/100.  Cardiac monitor results  Past Medical History:  Diagnosis Date   Diabetes mellitus without complication ([place])    Hyperlipidemia    Hypertension    Renal disorder    Stroke [facility])     Vitamin D deficiency     Past Surgical History:  Procedure Laterality Date   cardiac stents     CATARACT EXTRACTION W/PHACO Left 08/29/2020   Procedure: CATARACT EXTRACTION PHACO AND INTRAOCULAR LENS PLACEMENT LEFT EYE;  Surgeon: [name] Goldmann, MD;  Location: [facility];  Service: Ophthalmology;  Laterality: Left;  left CDE=29.30   CATARACT EXTRACTION W/PHACO Right 11/28/2020   Procedure: CATARACT EXTRACTION PHACO AND INTRAOCULAR LENS PLACEMENT ([facility]);  Surgeon: [name] Goldmann, MD;  Location: [facility];  Service: Ophthalmology;  Laterality: Right;  CDE 4.35   COLOSTOMY     TUBAL LIGATION     wound on buttocks      Current Outpatient Medications  Medication Sig Dispense Refill   acetaminophen (TYLENOL) 325 MG tablet Take 650 mg by mouth every 6 (six) hours as needed.     amLODipine (NORVASC) 5 MG tablet Take 5 mg by mouth See admin instructions. Sun, Mon, Wed and Fri     ascorbic acid (VITAMIN C) 500 MG tablet Take 500 mg by mouth 2 (two) times daily.     aspirin EC 81 MG tablet Take 81 mg by mouth daily. Swallow whole.     atenolol (TENORMIN) 25 MG tablet Take 0.5 tablets (12.5 mg total) by mouth daily. (Patient taking differently: Take 12.5 mg by mouth See admin instructions. Every Sun, Mon, Wed and Fri)     atorvastatin (LIPITOR) 80 MG tablet Take 1 tablet by mouth at bedtime.     b complex-vitamin  c-folic acid (NEPHRO-VITE) 0.8 MG TABS tablet Take 1 tablet by mouth daily.     Biotin 5 MG TABS Take 5 mg by mouth in the morning and at bedtime.     calcium carbonate (TUMS - DOSED IN MG ELEMENTAL CALCIUM) 500 MG chewable tablet Chew 1,000 mg by mouth 3 (three) times daily. Before meals     cetaphil (CETAPHIL) lotion Apply 1 application topically 2 (two) times daily as needed for dry skin.     Cholecalciferol (VITAMIN D) 50 MCG (2000 UT) CAPS Take 2,000 Units by mouth daily.     dicyclomine (BENTYL) 10 MG capsule Take 10 mg by mouth 3 (three) times daily before meals.     HUMALOG 100 UNIT/ML  injection Inject 5 Units into the skin 3 (three) times daily before meals.     LANTUS 100 UNIT/ML injection Inject 10 Units into the skin in the morning.     Lidocaine (ASPERCREME LIDOCAINE) 4 % PTCH Apply 1 application topically daily at 6 (six) AM.     lidocaine-prilocaine (EMLA) cream Apply 1 application topically daily as needed. Apply to HD graft site right arm topically one every tues, thurs, sat for pain.     melatonin 3 MG TABS tablet Take 3 mg by mouth at bedtime.     Multiple Vitamins-Minerals (CERTAGEN PO) Take 1 tablet by mouth in the morning.     nepafenac (NEVANAC) 0.1 % ophthalmic suspension Place 1 drop into the right eye daily.     pantoprazole (PROTONIX) 40 MG tablet Take 40 mg by mouth in the morning.     polyethylene glycol (MIRALAX / GLYCOLAX) 17 g packet Take 17 g by mouth daily.     prednisoLONE acetate (PRED FORTE) 1 % ophthalmic suspension Place 1 drop into the right eye 2 (two) times daily.     rOPINIRole (REQUIP) 2 MG tablet Take 2 mg by mouth at bedtime.     sennosides-docusate sodium (SENOKOT-S) 8.6-50 MG tablet Take 2 tablets by mouth daily.     thiamine 100 MG tablet Take 100 mg by mouth daily.     tiZANidine (ZANAFLEX) 4 MG tablet Take 4 mg by mouth at bedtime.     traMADol (ULTRAM) 50 MG tablet Take 100 mg by mouth every 6 (six) hours as needed.     No current facility-administered medications for this visit.   Allergies:  Corn-containing products, Lisinopril, and Morphine and related   Social History: The patient  reports that she has never smoked. She has never used smokeless tobacco. She reports that she does not drink alcohol and does not use drugs.   Family History: The patient's family history includes Cancer in her paternal grandmother; Diabetes in her father, mother, and sister; Glaucoma in her father; Heart disease in her father and mother; Hypertension in her father, mother, and sister.   ROS:  Please see the history of present illness. Otherwise,  complete review of systems is positive for none.  All other systems are reviewed and negative.   Physical Exam: VS:  BP (!) 148/104   Pulse 74   Ht '5\' 5"'$  (1.651 m)   Wt 173 lb 2.2 oz (78.5 kg)   SpO2 96%   BMI 28.81 kg/m , BMI Body mass index is 28.81 kg/m.  Wt Readings from Last 3 Encounters:  12/01/20 173 lb 2.2 oz (78.5 kg)  11/26/20 166 lb 0.1 oz (75.3 kg)  10/27/20 166 lb (75.3 kg)    General: Patient appears uncomfortable at rest.  Neck: Supple, no elevated JVP or carotid bruits, no thyromegaly. Lungs: Clear to auscultation, nonlabored breathing at rest. Cardiac: Regular rate and rhythm, no S3 or significant systolic murmur, no pericardial rub. Extremities: No pitting edema, distal pulses 2+. Skin: Warm and dry. Musculoskeletal: No kyphosis. Neuropsychiatric: Alert and oriented x3, affect grossly appropriate.  ECG: 12/01/2020 EKG normal sinus rhythm rate of 79, anterior infarct, age undetermined.  Recent Labwork: 09/02/2020: TSH 0.61 09/22/2020: ALT 14; AST 9; Magnesium 2.3; Platelets 338 11/28/2020: BUN 52; Creatinine, Ser 7.50; Hemoglobin 12.6; Potassium 5.6; Sodium 137  No results found for: CHOL, TRIG, HDL, CHOLHDL, VLDL, LDLCALC, LDLDIRECT  Other Studies Reviewed Today:  Cardiac monitor 10/26/2020 Study Highlights    Rare supraventricular ectopy. Rare episodes of SVT up to 8 beats Rare ventricular ectopy No symptoms reported     Patch Wear Time:  7 days and 1 hours (2022-06-01T08:59:11-398 to 2022-06-08T10:24:48-0400)   Patient had a min HR of 73 bpm, max HR of 154 bpm, and avg HR of 85 bpm. Predominant underlying rhythm was Sinus Rhythm. 10 Supraventricular Tachycardia runs occurred, the run with the fastest interval lasting 7 beats with a max rate of 154 bpm, the longest lasting 8 beats with an avg rate of 125 bpm. Isolated SVEs were rare (<1.0%), SVE Couplets were rare (<1.0%), and SVE Triplets were rare (<1.0%). Isolated VEs were rare (<1.0%), VE Couplets were  rare (<1.0%), and no VE Triplets were present.    [facility] 02/02/2017 Coronary arteries Dominance: right Left main: normal LAD: insignificant LCx: ramus 99% - medium; OM2 70% - medium RCA: insignificant RA: 14 mmHg (mean) RV: 62/ 14 mmHg PA: 62/ 25 42 mmHg (mean) PCW: 25 mmHg (mean) AV O2: 4.6 vol% Cardiac output: 5.0 L/min Cardiac index: 2.7 L/min-m2 PVR: 3.4 Wood units Impressions Elevated right and left sided filling pressures with a preserved cardiac index Significant systemic hypertension in 190-200 mm Hg range Patent stents in the mid LAD and mid RCA Total occlusion of the ramus with faint collaterals Distal small vessel disease in the circumflex and small PL system 20 ml of contrast used with biplane angiography Findings discussed with primary team Recommendations Continue medical therapy for distal small vessel CAD and diuresis/BP control for HFpEF Assessment and Plan:  1. Palpitations   2. CAD in native artery   3. Essential hypertension    1. Palpitations Recent cardiac monitor showed no significant arrhythmias.  See report above.  Patient states she has not experienced any more palpitations  2. CAD in native artery Denies any anginal symptoms.  EKG today shows normal sinus rhythm rate of 79, anterior infarct, age undetermined.  3. Essential hypertension Blood pressure elevated on arrival at 148/104.  Recheck in left arm 140/100.  She states she became nauseated this morning and threw up shortly after she took her morning blood pressure medications.  She is having some significant abdominal pain and nausea.  This may partially explain her elevated blood pressure in addition to blood pressure medication may not have been in her system a long enough to control her blood pressure.  4.  Abdominal pain, nausea, back pain Please get a CBC, basic metabolic panel, and urinalysis.  I have asked [facility] to draw these labs.   Medication Adjustments/Labs and Tests  Ordered: Current medicines are reviewed at length with the patient today.  Concerns regarding medicines are outlined above.   Disposition: Follow-up with Dr. Harl Bowie or APP 3 months  Signed, Levell July, NP 12/01/2020 1:33 PM  [facility] [place], [place], [place] [address] Phone: [phone]; Fax: [phone]

## 2020-12-01 ENCOUNTER — Other Ambulatory Visit: Payer: Self-pay

## 2020-12-01 ENCOUNTER — Encounter (HOSPITAL_COMMUNITY): Payer: Self-pay | Admitting: Ophthalmology

## 2020-12-01 ENCOUNTER — Ambulatory Visit (INDEPENDENT_AMBULATORY_CARE_PROVIDER_SITE_OTHER): Payer: Medicare Other | Admitting: Family Medicine

## 2020-12-01 ENCOUNTER — Emergency Department (HOSPITAL_COMMUNITY)
Admission: EM | Admit: 2020-12-01 | Discharge: 2020-12-02 | Disposition: A | Discharge status: Home / Self Care | Payer: Medicare Other | Attending: Emergency Medicine | Admitting: Emergency Medicine

## 2020-12-01 ENCOUNTER — Encounter (HOSPITAL_COMMUNITY): Payer: Self-pay | Admitting: Emergency Medicine

## 2020-12-01 ENCOUNTER — Emergency Department (HOSPITAL_COMMUNITY): Payer: Medicare Other

## 2020-12-01 VITALS — BP 148/104 | HR 74 | Ht 65.0 in | Wt 173.1 lb

## 2020-12-01 DIAGNOSIS — E1122 Type 2 diabetes mellitus with diabetic chronic kidney disease: Secondary | ICD-10-CM | POA: Diagnosis not present

## 2020-12-01 DIAGNOSIS — Z992 Dependence on renal dialysis: Secondary | ICD-10-CM | POA: Insufficient documentation

## 2020-12-01 DIAGNOSIS — Z7982 Long term (current) use of aspirin: Secondary | ICD-10-CM | POA: Insufficient documentation

## 2020-12-01 DIAGNOSIS — R002 Palpitations: Secondary | ICD-10-CM | POA: Diagnosis not present

## 2020-12-01 DIAGNOSIS — I120 Hypertensive chronic kidney disease with stage 5 chronic kidney disease or end stage renal disease: Secondary | ICD-10-CM | POA: Diagnosis not present

## 2020-12-01 DIAGNOSIS — N390 Urinary tract infection, site not specified: Secondary | ICD-10-CM | POA: Diagnosis not present

## 2020-12-01 DIAGNOSIS — D72829 Elevated white blood cell count, unspecified: Secondary | ICD-10-CM | POA: Diagnosis not present

## 2020-12-01 DIAGNOSIS — Z794 Long term (current) use of insulin: Secondary | ICD-10-CM | POA: Diagnosis not present

## 2020-12-01 DIAGNOSIS — N186 End stage renal disease: Secondary | ICD-10-CM | POA: Insufficient documentation

## 2020-12-01 DIAGNOSIS — R1030 Lower abdominal pain, unspecified: Secondary | ICD-10-CM | POA: Diagnosis present

## 2020-12-01 DIAGNOSIS — Z79899 Other long term (current) drug therapy: Secondary | ICD-10-CM | POA: Insufficient documentation

## 2020-12-01 DIAGNOSIS — R109 Unspecified abdominal pain: Secondary | ICD-10-CM

## 2020-12-01 DIAGNOSIS — I2510 Atherosclerotic heart disease of native coronary artery without angina pectoris: Secondary | ICD-10-CM

## 2020-12-01 DIAGNOSIS — I10 Essential (primary) hypertension: Secondary | ICD-10-CM

## 2020-12-01 LAB — CBC WITH DIFFERENTIAL/PLATELET
Abs Immature Granulocytes: 0.05 10*3/uL (ref 0.00–0.07)
Basophils Absolute: 0 10*3/uL (ref 0.0–0.1)
Basophils Relative: 0 %
Eosinophils Absolute: 0 10*3/uL (ref 0.0–0.5)
Eosinophils Relative: 0 %
HCT: 39 % (ref 36.0–46.0)
Hemoglobin: 12.2 g/dL (ref 12.0–15.0)
Immature Granulocytes: 0 %
Lymphocytes Relative: 6 %
Lymphs Abs: 0.9 10*3/uL (ref 0.7–4.0)
MCH: 29.8 pg (ref 26.0–34.0)
MCHC: 31.3 g/dL (ref 30.0–36.0)
MCV: 95.4 fL (ref 80.0–100.0)
Monocytes Absolute: 0.5 10*3/uL (ref 0.1–1.0)
Monocytes Relative: 3 %
Neutro Abs: 13.2 10*3/uL — ABNORMAL HIGH (ref 1.7–7.7)
Neutrophils Relative %: 91 %
Platelets: 396 10*3/uL (ref 150–400)
RBC: 4.09 MIL/uL (ref 3.87–5.11)
RDW: 14.6 % (ref 11.5–15.5)
WBC: 14.6 10*3/uL — ABNORMAL HIGH (ref 4.0–10.5)
nRBC: 0 % (ref 0.0–0.2)

## 2020-12-01 LAB — URINALYSIS, ROUTINE W REFLEX MICROSCOPIC
Bilirubin Urine: NEGATIVE
Glucose, UA: NEGATIVE mg/dL
Ketones, ur: NEGATIVE mg/dL
Nitrite: NEGATIVE
Protein, ur: >=300 mg/dL — AB
RBC / HPF: >50 RBC/hpf — ABNORMAL HIGH (ref 0–5)
Specific Gravity, Urine: 1.013 (ref 1.005–1.030)
pH: 8 (ref 5.0–8.0)

## 2020-12-01 LAB — COMPREHENSIVE METABOLIC PANEL
ALT: 17 U/L (ref 0–44)
AST: 8 U/L — ABNORMAL LOW (ref 15–41)
Albumin: 4.4 g/dL (ref 3.5–5.0)
Alkaline Phosphatase: 148 U/L — ABNORMAL HIGH (ref 38–126)
Anion gap: 20 — ABNORMAL HIGH (ref 5–15)
BUN: 58 mg/dL — ABNORMAL HIGH (ref 6–20)
CO2: 22 mmol/L (ref 22–32)
Calcium: 9.1 mg/dL (ref 8.9–10.3)
Chloride: 97 mmol/L — ABNORMAL LOW (ref 98–111)
Creatinine, Ser: 8.68 mg/dL — ABNORMAL HIGH (ref 0.44–1.00)
GFR, Estimated: 5 mL/min — ABNORMAL LOW (ref >60)
Glucose, Bld: 104 mg/dL — ABNORMAL HIGH (ref 70–99)
Potassium: 4.6 mmol/L (ref 3.5–5.1)
Sodium: 139 mmol/L (ref 135–145)
Total Bilirubin: 0.9 mg/dL (ref 0.3–1.2)
Total Protein: 9.3 g/dL — ABNORMAL HIGH (ref 6.5–8.1)

## 2020-12-01 MED ORDER — SODIUM CHLORIDE 0.9 % IV BOLUS
1000.0000 mL | Freq: Once | INTRAVENOUS | Status: AC
  Administered 2020-12-01: 1000 mL via INTRAVENOUS

## 2020-12-01 MED ORDER — AMLODIPINE BESYLATE 5 MG PO TABS
5.0000 mg | ORAL_TABLET | Freq: Once | ORAL | Status: AC
  Administered 2020-12-02: 5 mg via ORAL
  Filled 2020-12-01: qty 1

## 2020-12-01 MED ORDER — CEFUROXIME AXETIL 500 MG PO TABS: 500.0000 mg | ORAL_TABLET | Freq: Two times a day (BID) | ORAL | 0 refills | Status: DC

## 2020-12-01 MED ORDER — IOHEXOL 300 MG/ML  SOLN
100.0000 mL | Freq: Once | INTRAMUSCULAR | Status: AC | PRN
  Administered 2020-12-01: 100 mL via INTRAVENOUS

## 2020-12-01 MED ORDER — OXYCODONE-ACETAMINOPHEN 5-325 MG PO TABS
1.0000 | ORAL_TABLET | Freq: Once | ORAL | Status: AC
Start: 2020-12-02 — End: 2020-12-02
  Administered 2020-12-02: 1 via ORAL
  Filled 2020-12-01: qty 1

## 2020-12-01 MED ORDER — SODIUM CHLORIDE 0.9 % IV SOLN
1.0000 g | Freq: Once | INTRAVENOUS | Status: AC
  Administered 2020-12-02: 1 g via INTRAVENOUS
  Filled 2020-12-01: qty 10

## 2020-12-01 NOTE — ED Notes (Signed)
Patient transported to CT 

## 2020-12-01 NOTE — ED Notes (Signed)
Unsuccessful IV attempt x2. Another RN to attempt.

## 2020-12-01 NOTE — Patient Instructions (Signed)
Medication Instructions:  Continue all current medications.  Labwork: CBC. BMET, UA Office will contact with results via phone or letter.    Testing/Procedures: none  Follow-Up: 3 months   Any Other Special Instructions Will Be Listed Below (If Applicable).  If you need a refill on your cardiac medications before your next appointment, please call your pharmacy.

## 2020-12-01 NOTE — Discharge Instructions (Signed)
Be sure to get your dialysis treatment tomorrow.  Is important that you take your blood pressure medications daily.  Your work-up this evening shows that you likely have a urinary tract infection and a possible left-sided kidney stone.  Call Dr. Noland Fordyce office tomorrow to arrange a follow-up appointment.  Take the antibiotic as directed until it is finished.

## 2020-12-01 NOTE — ED Triage Notes (Signed)
Pt arrives via [facility] from [facility] with c/o UTI with cloudy, malodorous urine and urinary frequency. Hx of HTN, but did not take prescribed medication this morning due to nausea.

## 2020-12-01 NOTE — Addendum Note (Signed)
Addended by: Laurine Blazer on: 12/01/2020 04:41 PM   Modules accepted: Orders

## 2020-12-01 NOTE — ED Provider Notes (Signed)
[facility] EMERGENCY DEPARTMENT Provider Note   CSN: [id number] Arrival date & time: 12/01/20  1611     History Chief Complaint  Patient presents with   Urinary Frequency    [name] Barnett is a 40 y.o. female.   Urinary Frequency Associated symptoms include abdominal pain. Pertinent negatives include no chest pain and no shortness of breath.       [name] Barnett is a 40 y.o. female with past medical history of diabetes, hypertension, stroke, end-stage renal disease on dialysis,and colostomy who presents to the Emergency Department via EMS from [facility]  She complains of left flank and lower abdominal pain.  She complains of pain of her lower back for 2 weeks.  Abdominal pain for several days.  Pain is been associated with nausea and vomiting starting today.  Pain is been constant.  She states that she has increased urinary frequency with foul-smelling urine and that her urine has appeared cloudy.  No fever or chills.  No chest pain or shortness of breath.  States that her colostomy has been working well.  She was last dialyzed on Saturday.   Past Medical History:  Diagnosis Date   Diabetes mellitus without complication ([place])    Hyperlipidemia    Hypertension    Renal disorder    Stroke [facility])    Vitamin D deficiency     Patient Active Problem List   Diagnosis Date Noted   BV (bacterial vaginosis) 04/02/2020   Vaginal itching 04/02/2020   Vaginal odor 04/02/2020   Vaginal discharge 04/02/2020   Sepsis secondary to UTI ([place]) 01/13/2020   Acute respiratory failure with hypoxia ([place]) 01/13/2020   Right leg swelling 01/13/2020   Sacral decubitus ulcer 01/13/2020   Anemia due to end stage renal disease ([place]) 01/13/2020   Essential hypertension 01/13/2020   Hyperlipidemia 01/13/2020   Type 2 diabetes mellitus ([place]) 01/13/2020    Past Surgical History:  Procedure Laterality Date   cardiac stents     CATARACT EXTRACTION W/PHACO Left 08/29/2020    Procedure: CATARACT EXTRACTION PHACO AND INTRAOCULAR LENS PLACEMENT LEFT EYE;  Surgeon: Baruch Goldmann, MD;  Location: [facility];  Service: Ophthalmology;  Laterality: Left;  left CDE=29.30   CATARACT EXTRACTION W/PHACO Right 11/28/2020   Procedure: CATARACT EXTRACTION PHACO AND INTRAOCULAR LENS PLACEMENT (IOC);  Surgeon: Baruch Goldmann, MD;  Location: [facility] OR[facility];  Service: Ophthalmology;  Laterality: Right;  CDE 4.35   COLOSTOMY     TUBAL LIGATION     wound on buttocks       OB History     Gravida  6   Para  3   Term  1   Preterm  2   AB  3   Living  3      SAB  3   IAB      Ectopic      Multiple      Live Births  3           Family History  Problem Relation Age of Onset   Cancer Paternal Grandmother    Heart disease Father    Hypertension Father    Diabetes Father    Glaucoma Father    Heart disease Mother    Hypertension Mother    Diabetes Mother    Diabetes Sister        borderline   Hypertension Sister     Social History   Tobacco Use   Smoking status: Never   Smokeless tobacco: Never  Vaping Use   Vaping Use: Never used  Substance Use Topics   Alcohol use: Never   Drug use: Never    Home Medications Prior to Admission medications   Medication Sig Start Date End Date Taking? Authorizing Provider  acetaminophen (TYLENOL) 325 MG tablet Take 650 mg by mouth every 6 (six) hours as needed.    [provider]  amLODipine (NORVASC) 5 MG tablet Take 5 mg by mouth See admin instructions. Sun, Mon, Wed and Fri    [provider]  ascorbic acid (VITAMIN C) 500 MG tablet Take 500 mg by mouth 2 (two) times daily.    [provider]  aspirin EC 81 MG tablet Take 81 mg by mouth daily. Swallow whole.    [provider]  atenolol (TENORMIN) 25 MG tablet Take 0.5 tablets (12.5 mg total) by mouth daily. Patient taking differently: Take 12.5 mg by mouth See admin instructions. Every Sun, Mon, Wed and Fri 01/17/20   Barton Dubois,  MD  atorvastatin (LIPITOR) 80 MG tablet Take 1 tablet by mouth at bedtime.    [provider]  b complex-vitamin c-folic acid (NEPHRO-VITE) 0.8 MG TABS tablet Take 1 tablet by mouth daily.    [provider]  Biotin 5 MG TABS Take 5 mg by mouth in the morning and at bedtime.    [provider]  calcium carbonate (TUMS - DOSED IN MG ELEMENTAL CALCIUM) 500 MG chewable tablet Chew 1,000 mg by mouth 3 (three) times daily. Before meals    [provider]  cetaphil (CETAPHIL) lotion Apply 1 application topically 2 (two) times daily as needed for dry skin.    [provider]  Cholecalciferol (VITAMIN D) 50 MCG (2000 UT) CAPS Take 2,000 Units by mouth daily.    [provider]  dicyclomine (BENTYL) 10 MG capsule Take 10 mg by mouth 3 (three) times daily before meals.    [provider]  HUMALOG 100 UNIT/ML injection Inject 5 Units into the skin 3 (three) times daily before meals. 08/24/20   [provider]  LANTUS 100 UNIT/ML injection Inject 10 Units into the skin in the morning. 11/04/20   [provider]  Lidocaine (ASPERCREME LIDOCAINE) 4 % PTCH Apply 1 application topically daily at 6 (six) AM.    [provider]  lidocaine-prilocaine (EMLA) cream Apply 1 application topically daily as needed. Apply to HD graft site right arm topically one every tues, thurs, sat for pain.    [provider]  melatonin 3 MG TABS tablet Take 3 mg by mouth at bedtime. 10/31/18   [provider]  Multiple Vitamins-Minerals (CERTAGEN PO) Take 1 tablet by mouth in the morning.    [provider]  nepafenac (NEVANAC) 0.1 % ophthalmic suspension Place 1 drop into the right eye daily.    [provider]  pantoprazole (PROTONIX) 40 MG tablet Take 40 mg by mouth in the morning.    [provider]  polyethylene glycol (MIRALAX / GLYCOLAX) 17 g packet Take 17 g by mouth daily.    [provider]   prednisoLONE acetate (PRED FORTE) 1 % ophthalmic suspension Place 1 drop into the right eye 2 (two) times daily. 09/11/20   [provider]  rOPINIRole (REQUIP) 2 MG tablet Take 2 mg by mouth at bedtime.    [provider]  sennosides-docusate sodium (SENOKOT-S) 8.6-50 MG tablet Take 2 tablets by mouth daily.    [provider]  thiamine 100 MG tablet  Take 100 mg by mouth daily.    [provider]  tiZANidine (ZANAFLEX) 4 MG tablet Take 4 mg by mouth at bedtime.    [provider]  traMADol (ULTRAM) 50 MG tablet Take 100 mg by mouth every 6 (six) hours as needed. 10/20/20   [provider]    Allergies    Corn-containing products, Lisinopril, and Morphine and related  Review of Systems   Review of Systems  Constitutional:  Negative for appetite change, chills and fever.  Respiratory:  Negative for cough and shortness of breath.   Cardiovascular:  Negative for chest pain.  Gastrointestinal:  Positive for abdominal pain, nausea and vomiting.  Genitourinary:  Positive for flank pain and frequency.  Musculoskeletal:  Positive for back pain.  Skin:  Negative for rash.  Neurological:  Negative for weakness and numbness.   Physical Exam Updated Vital Signs BP (!) 153/88 (BP Location: Left Arm)   Pulse 86   Temp (!) 97.5 F (36.4 C) (Oral)   Resp 16   SpO2 98%   Physical Exam Vitals and nursing note reviewed.  Constitutional:      General: She is not in acute distress.    Appearance: Normal appearance. She is not toxic-appearing.  HENT:     Head: Normocephalic.  Cardiovascular:     Rate and Rhythm: Normal rate and regular rhythm.     Pulses: Normal pulses.  Pulmonary:     Effort: Pulmonary effort is normal.     Breath sounds: Normal breath sounds. No wheezing.  Abdominal:     Palpations: Abdomen is soft.     Tenderness: There is abdominal tenderness. There is no guarding or rebound.     Comments: Mild tenderness of the lower  abdomen, colostomy present with brown liquid stool present in the colostomy bag.  Musculoskeletal:     Right lower leg: No edema.     Left lower leg: No edema.  Skin:    General: Skin is warm.     Capillary Refill: Capillary refill takes less than 2 seconds.     Findings: No rash.  Neurological:     General: No focal deficit present.     Mental Status: She is alert.     Sensory: No sensory deficit.     Motor: No weakness.    ED Results / Procedures / Treatments   Labs (all labs ordered are listed, but only abnormal results are displayed) Labs Reviewed  CBC WITH DIFFERENTIAL/PLATELET - Abnormal; Notable for the following components:      Result Value   WBC 14.6 (*)    Neutro Abs 13.2 (*)    All other components within normal limits  COMPREHENSIVE METABOLIC PANEL - Abnormal; Notable for the following components:   Chloride 97 (*)    Glucose, Bld 104 (*)    BUN 58 (*)    Creatinine, Ser 8.68 (*)    Total Protein 9.3 (*)    AST 8 (*)    Alkaline Phosphatase 148 (*)    GFR, Estimated 5 (*)    Anion gap 20 (*)    All other components within normal limits  URINALYSIS, ROUTINE W REFLEX MICROSCOPIC    EKG None  Radiology CT ABDOMEN PELVIS W CONTRAST  Result Date: 12/01/2020 CLINICAL DATA:  Nausea/vomiting, acute abdominal pain, acute, nonlocalized. Complains of urinary tract infection. Dialysis patient. EXAM: CT ABDOMEN AND PELVIS WITH CONTRAST TECHNIQUE: Multidetector CT imaging of the abdomen and pelvis was performed using the standard protocol following  bolus administration of intravenous contrast. CONTRAST:  136m OMNIPAQUE IOHEXOL 300 MG/ML  SOLN COMPARISON:  Abdominopelvic CT 05/29/2019.  Lumbar MRI 09/03/2020. FINDINGS: Lower chest: Stable mild scarring at the lung bases. No significant pleural or pericardial effusion. Coronary artery atherosclerosis noted. Hepatobiliary: The liver is normal in density without suspicious focal abnormality. No evidence of gallstones,  gallbladder wall thickening or biliary dilatation. Pancreas: Unremarkable. No pancreatic ductal dilatation or surrounding inflammatory changes. Spleen: Normal in size without focal abnormality. Adrenals/Urinary Tract: Both adrenal glands appear normal. There is new asymmetric dilatation of the left renal pelvis and left ureter with associated wall thickening and delayed contrast excretion. New 3 mm left pelvic calcification (image 70/2) is in close proximity to the distal left ureter and could reflect a distal left ureteral calculus. No other evidence of urinary tract calculus. Right kidney appears normal. Mild diffuse bladder wall thickening. Stomach/Bowel: No enteric contrast administered. The stomach appears unremarkable for its degree of distension. No evidence of bowel wall thickening, distention or surrounding inflammatory change. The appendix is not clearly visualized, although there is no pericecal inflammation. Diverting sigmoid colostomy appears unchanged. Vascular/Lymphatic: There are no enlarged abdominal or pelvic lymph nodes. No acute vascular findings. Mild iliac atherosclerosis. Reproductive: The uterus and ovaries appear unchanged. No adnexal mass. Other: No ascites, free air or abdominal wall hernia. Musculoskeletal: Chronic sacral decubitus ulcer along the right aspect of the distal sacrum and coccyx again noted. The underlying sacrum appears stable without progressive bone destruction. Chronic changes of bilateral sacroiliitis. Chronic endplate destruction and sclerosis at L4-5, and to a lesser degree at L3-4 and L5-S1. No new osseous findings are seen. IMPRESSION: 1. New left-sided hydronephrosis with wall thickening and delayed contrast excretion suspicious for distal left ureteral obstruction. This may be on the basis a small distal ureteral calculus. 2. No other acute findings are identified. There is mild chronic bladder wall thickening which appears similar to previous study. 3.  Postsurgical changes from diverting sigmoid colostomy. No acute bowel findings. 4. 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 No apparent acute osseous findings. Electronically Signed   By: WRichardean SaleM.D.   On: 12/01/2020 21:48    Procedures Procedures   Medications Ordered in ED Medications - No data to display  ED Course  I have reviewed the triage vital signs and the nursing notes.  Pertinent labs & imaging results that were available during my care of the patient were reviewed by me and considered in my medical decision making (see chart for details).    MDM Rules/Calculators/A&P                           [facility].  Symptoms have been gradually worsening for several days.  Patient endorses dysuria symptoms as well.  On exam, patient is somnolent but wakes up to answer questions.  No focal neurodeficits on exam.  She does have some tenderness of the lower abdomen and left flank.  No appreciable CVA tenderness.  Will obtain labs and CT abdomen pelvis.  She is nontoxic-appearing.  On recheck, patient resting comfortably.  No acute distress.  She does not appear toxic.  No active vomiting.  Labs interpreted by me show a leukocytosis with white count of 14,000.  Electrolytes show elevated creatinine near baseline.  Patient does have end-stage renal disease on dialysis  and was last dialyzed on Saturday.  Urinalysis shows turbid urine with moderate hemoglobin, proteinuria and moderate leukocytes.  11-20 white cells and few bacteria.  Urine culture pending. Pt hypertensive, did not take her antihypertensive medication today.  No chest pain or headache. Given dose of her amlodipine here  Given patient's complicated medical history, I will consult urology.  Discussed findings with urology,  Dr. Alyson Ingles who  agrees to see pt in office.  Will give Rocephin IV here and sent out on Ceftin.  Patient agreeable to plan.  Strict return precautions were discussed.  Final Clinical Impression(s) / ED Diagnoses Final diagnoses:  Acute flank pain  Acute urinary tract infection  Hypertension, unspecified type    Rx / DC Orders ED Discharge Orders     None        Kem Parkinson, PA-C 12/02/20 0002    Margette Fast, MD 12/02/20 1158

## 2020-12-02 DIAGNOSIS — N390 Urinary tract infection, site not specified: Secondary | ICD-10-CM | POA: Diagnosis not present

## 2020-12-03 ENCOUNTER — Ambulatory Visit: Payer: Medicare Other | Admitting: Nurse Practitioner

## 2020-12-03 DIAGNOSIS — E0590 Thyrotoxicosis, unspecified without thyrotoxic crisis or storm: Secondary | ICD-10-CM

## 2020-12-05 ENCOUNTER — Other Ambulatory Visit: Payer: Self-pay

## 2020-12-05 ENCOUNTER — Ambulatory Visit (INDEPENDENT_AMBULATORY_CARE_PROVIDER_SITE_OTHER): Payer: Medicare Other | Admitting: Specialist

## 2020-12-05 ENCOUNTER — Encounter: Payer: Self-pay | Admitting: Specialist

## 2020-12-05 VITALS — BP 149/93 | HR 82 | Temp 98.3°F | Ht 65.0 in | Wt 173.0 lb

## 2020-12-05 DIAGNOSIS — I69354 Hemiplegia and hemiparesis following cerebral infarction affecting left non-dominant side: Secondary | ICD-10-CM | POA: Diagnosis not present

## 2020-12-05 DIAGNOSIS — I2510 Atherosclerotic heart disease of native coronary artery without angina pectoris: Secondary | ICD-10-CM | POA: Diagnosis not present

## 2020-12-05 DIAGNOSIS — M21372 Foot drop, left foot: Secondary | ICD-10-CM | POA: Diagnosis not present

## 2020-12-05 LAB — URINE CULTURE: Culture: >=100000 — AB

## 2020-12-05 NOTE — Patient Instructions (Signed)
  Plan: Obtain a walker with seat and wheels for ambulation. Obtain a left ankle foot orthosis with articulated ankle to allow dorsiflexion of the left foot and restrict plantar flexion.  Return in 2 weeks, if antibiotics have returned her to baseline laboratory, normal CBC and no clinical markers of active infection then continue to follow conservatively.

## 2020-12-05 NOTE — Progress Notes (Signed)
Office Visit Note   Patient: [name] Barnett           Date of Birth: [birth year]           MRN: [id number] Visit Date: 12/05/2020              Requested by: Julianne Handler, DO [address] [place],   [id number] PCP: Julianne Handler, DO   Assessment & Plan: Visit Diagnoses:  1. Foot drop, left   2. 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 Obtain a left ankle foot orthosis with articulated ankle to allow dorsiflexion of the left foot and restrict plantar flexion.  Return in 2 weeks, if antibiotics have returned her to baseline laboratory, normal CBC and no clinical markers of active infection then continue to follow conservatively.   Follow-Up Instructions: No follow-ups on file.   Orders:  No orders of the defined types were placed in this encounter.  No orders of the defined types were placed in this encounter.     Procedures: No procedures performed   Clinical Data: No additional findings.   Subjective: Chief Complaint  Patient presents with   Lower Back - Pain, Follow-up    40 year old female with history of lumbar vertebral osteomyelolitis and discitis due to seeding from a previous UTI   Review of Systems   Objective: Vital Signs: BP (!) 149/93   Pulse 82   Temp 98.3 F (36.8 C)   Ht '5\' 5"'$  (1.651 m)   Wt 173 lb (78.5 kg)   BMI 28.79 kg/m   Physical Exam  Ortho Exam  Specialty Comments:  No specialty comments available.  Imaging: No results found.   PMFS History: Patient Active Problem List   Diagnosis Date Noted   BV (bacterial vaginosis) 04/02/2020   Vaginal itching 04/02/2020   Vaginal odor 04/02/2020   Vaginal discharge 04/02/2020   Sepsis secondary to UTI ([place]) 01/13/2020   Acute respiratory failure with hypoxia ([place]) 01/13/2020   Right leg swelling 01/13/2020   Sacral decubitus ulcer  01/13/2020   Anemia due to end stage renal disease ([place]) 01/13/2020   Essential hypertension 01/13/2020   Hyperlipidemia 01/13/2020   Type 2 diabetes mellitus ([place]) 01/13/2020   Past Medical History:  Diagnosis Date   Diabetes mellitus without complication ([place])    Hyperlipidemia    Hypertension    Renal disorder    Stroke ([place])    Vitamin D deficiency     Family History  Problem Relation Age of Onset   Cancer Paternal Grandmother    Heart disease Father    Hypertension Father    Diabetes Father    Glaucoma Father    Heart disease Mother    Hypertension Mother    Diabetes Mother    Diabetes Sister        borderline   Hypertension Sister     Past Surgical History:  Procedure Laterality Date   cardiac stents     CATARACT EXTRACTION W/PHACO Left 08/29/2020   Procedure: CATARACT EXTRACTION PHACO AND INTRAOCULAR LENS PLACEMENT LEFT EYE;  Surgeon: Baruch Goldmann, MD;  Location: [facility];  Service: Ophthalmology;  Laterality: Left;  left CDE=29.30   CATARACT EXTRACTION W/PHACO Right 11/28/2020   Procedure: CATARACT EXTRACTION PHACO AND INTRAOCULAR LENS PLACEMENT (IOC);  Surgeon: Baruch Goldmann, MD;  Location: [facility];  Service: Ophthalmology;  Laterality: Right;  CDE 4.35  COLOSTOMY     TUBAL LIGATION     wound on buttocks     Social History   Occupational History   Not on file  Tobacco Use   Smoking status: Never   Smokeless tobacco: Never  Vaping Use   Vaping Use: Never used  Substance and Sexual Activity   Alcohol use: Never   Drug use: Never   Sexual activity: Not Currently    Birth control/protection: Surgical    Comment: tubal

## 2020-12-06 NOTE — Progress Notes (Signed)
ED Antimicrobial Stewardship Positive Culture Follow Up   [name] Barnett is an 40 y.o. female who presented to [facility] on 12/01/2020 with a chief complaint of  Chief Complaint  Patient presents with   Urinary Frequency    Recent Results (from the past 720 hour(s))  Urine Culture     Status: Abnormal   Collection Time: 12/01/20 11:01 PM   Specimen: Urine, Clean Catch  Result Value Ref Range Status   Specimen Description   Final    URINE, CLEAN CATCH Performed at [facility], [address], [place], [place] [address]    Special Requests   Final    NONE Performed at [facility], [address] [place], [place] [address]    Culture (A)  Final    >=100,000 COLONIES/mL ESCHERICHIA COLI Confirmed Extended Spectrum Beta-Lactamase Producer (ESBL).  In bloodstream infections from ESBL organisms, carbapenems are preferred over piperacillin/tazobactam. They are shown to have a lower risk of mortality.    Report Status 12/05/2020 FINAL  Final   Organism ID, Bacteria ESCHERICHIA COLI (A)  Final      Susceptibility   Escherichia coli - MIC*    AMPICILLIN >=32 RESISTANT Resistant     CEFAZOLIN >=64 RESISTANT Resistant     CEFEPIME 16 RESISTANT Resistant     CEFTRIAXONE >=64 RESISTANT Resistant     CIPROFLOXACIN >=4 RESISTANT Resistant     GENTAMICIN <=1 SENSITIVE Sensitive     IMIPENEM <=0.25 SENSITIVE Sensitive     NITROFURANTOIN 32 SENSITIVE Sensitive     TRIMETH/SULFA >=320 RESISTANT Resistant     AMPICILLIN/SULBACTAM >=32 RESISTANT Resistant     PIP/TAZO <=4 SENSITIVE Sensitive     * >=100,000 COLONIES/mL ESCHERICHIA COLI   '[x]'$  Treated with cefuroxime, organism resistant to prescribed antimicrobial '[]'$  Patient discharged originally without antimicrobial agent and treatment is now indicated  New antibiotic prescription: Patient has been referred to Urology, who saw patient on 8/5 and has sent off urinalysis with reflex to culture. F/u with patient to ensure they see Urology  for results and also send our culture information from 8/1 to patients Urologist office.  ED Provider: Sharyn Lull, PA-C  Joetta Manners, PharmD, [facility] Emergency Medicine Clinical Pharmacist ED RPh Phone: [place]: [phone]

## 2020-12-07 ENCOUNTER — Telehealth: Payer: Self-pay | Admitting: Emergency Medicine

## 2020-12-07 NOTE — Telephone Encounter (Signed)
Post ED Visit - Positive Culture Follow-up: Successful Patient Follow-Up  Culture assessed and recommendations reviewed by:  '[]'$  Elenor Quinones, Pharm.D. '[]'$  Heide Guile, Pharm.D., BCPS AQ-ID '[]'$  Parks Neptune, Pharm.D., BCPS '[]'$  Alycia Rossetti, Pharm.D., BCPS '[]'$  [place], [place].D., BCPS, AAHIVP '[]'$  Legrand Como, Pharm.D., BCPS, AAHIVP '[]'$  Salome Arnt, PharmD, BCPS '[]'$  Johnnette Gourd, PharmD, BCPS '[]'$  Hughes Better, PharmD, BCPS '[x]'$  Joetta Manners, PharmD  Positive urine culture  '[]'$  Patient discharged without antimicrobial prescription and treatment is now indicated '[]'$  Organism is resistant to prescribed ED discharge antimicrobial '[]'$  Patient with positive blood cultures  Changes discussed with ED provider: Lennice Sites MD Called/faxed to [place] patient's SNF, date 12/07/2020, time [place] 12/07/2020, 6:12 PM

## 2020-12-08 ENCOUNTER — Telehealth: Payer: Self-pay | Admitting: Specialist

## 2020-12-08 ENCOUNTER — Other Ambulatory Visit: Payer: Self-pay

## 2020-12-08 ENCOUNTER — Encounter: Payer: Self-pay | Admitting: Urology

## 2020-12-08 ENCOUNTER — Ambulatory Visit (INDEPENDENT_AMBULATORY_CARE_PROVIDER_SITE_OTHER): Payer: Medicare Other | Admitting: Urology

## 2020-12-08 VITALS — BP 144/83 | HR 87

## 2020-12-08 DIAGNOSIS — I2510 Atherosclerotic heart disease of native coronary artery without angina pectoris: Secondary | ICD-10-CM

## 2020-12-08 DIAGNOSIS — N1330 Unspecified hydronephrosis: Secondary | ICD-10-CM

## 2020-12-08 DIAGNOSIS — N200 Calculus of kidney: Secondary | ICD-10-CM | POA: Diagnosis not present

## 2020-12-08 LAB — MICROSCOPIC EXAMINATION
Epithelial Cells (non renal): >10 /hpf — AB (ref 0–10)
Renal Epithel, UA: NONE SEEN /hpf
WBC, UA: >30 /hpf — AB (ref 0–5)

## 2020-12-08 LAB — URINALYSIS, ROUTINE W REFLEX MICROSCOPIC
Bilirubin, UA: NEGATIVE
Glucose, UA: NEGATIVE
Ketones, UA: NEGATIVE
Nitrite, UA: NEGATIVE
Specific Gravity, UA: 1.02 (ref 1.005–1.030)
Urobilinogen, Ur: 0.2 mg/dL (ref 0.2–1.0)
pH, UA: 7 (ref 5.0–7.5)

## 2020-12-08 MED ORDER — OXYCODONE-ACETAMINOPHEN 5-325 MG PO TABS
1.0000 | ORAL_TABLET | ORAL | 0 refills | Status: DC | PRN
Start: 2020-12-08 — End: 2021-07-01

## 2020-12-08 MED ORDER — ONDANSETRON 4 MG PO TBDP: 4.0000 mg | ORAL_TABLET | Freq: Three times a day (TID) | ORAL | 0 refills | Status: AC | PRN

## 2020-12-08 MED ORDER — TAMSULOSIN HCL 0.4 MG PO CAPS: 0.4000 mg | ORAL_CAPSULE | Freq: Every day | ORAL | 0 refills | Status: AC

## 2020-12-08 NOTE — Patient Instructions (Signed)
Kidney Stones Kidney stones are rock-like masses that form inside of the kidneys. Kidneys are organs that make pee (urine). A kidney stone may move into other parts of the urinary tract, including: The tubes that connect the kidneys to the bladder (ureters). The bladder. The tube that carries urine out of the body (urethra). Kidney stones can cause very bad pain and can block the flow of pee. The stone usually leaves your body (passes) through your pee. You may need to have a doctor take out the stone. What are the causes? Kidney stones may be caused by: A condition in which certain glands make too much parathyroid hormone (primary hyperparathyroidism). A buildup of a type of crystals in the bladder made of a chemical called uric acid. The body makes uric acid when you eat certain foods. Narrowing (stricture) of one or both of the ureters. A kidney blockage that you were born with. Past surgery on the kidney or the ureters, such as gastric bypass surgery. What increases the risk? You are more likely to develop this condition if: You have had a kidney stone in the past. You have a family history of kidney stones. You do not drink enough water. You eat a diet that is high in protein, salt (sodium), or sugar. You are overweight or very overweight (obese). What are the signs or symptoms? Symptoms of a kidney stone may include: Pain in the side of the belly, right below the ribs (flank pain). Pain usually spreads (radiates) to the groin. Needing to pee often or right away (urgently). Pain when going pee (urinating). Blood in your pee (hematuria). Feeling like you may vomit (nauseous). Vomiting. Fever and chills. How is this treated? Treatment depends on the size, location, and makeup of the kidney stones. The stones will often pass out of the body through peeing. You may need to: Drink more fluid to help pass the stone. In some cases, you may be given fluids through an IV tube put into one  of your veins at the hospital. Take medicine for pain. Make changes in your diet to help keep kidney stones from coming back. Sometimes, medical procedures are needed to remove a kidney stone. This may involve: A procedure to break up kidney stones using a beam of light (laser) or shock waves. Surgery to remove the kidney stones. Follow these instructions at home: Medicines Take over-the-counter and prescription medicines only as told by your doctor. Ask your doctor if the medicine prescribed to you requires you to avoid driving or using heavy machinery. Eating and drinking Drink enough fluid to keep your pee pale yellow. You may be told to drink at least 8-10 glasses of water each day. This will help you pass the stone. If told by your doctor, change your diet. This may include: Limiting how much salt you eat. Eating more fruits and vegetables. Limiting how much meat, poultry, fish, and eggs you eat. Follow instructions from your doctor about eating or drinking restrictions. General instructions Collect pee samples as told by your doctor. You may need to collect a pee sample: 24 hours after a stone comes out. 8-12 weeks after a stone comes out, and every 6-12 months after that. Strain your pee every time you pee (urinate), for as long as told. Use the strainer that your doctor recommends. Do not throw out the stone. Keep it so that it can be tested by your doctor. Keep all follow-up visits as told by your doctor. This is important. You may need   follow-up tests. How is this prevented? To prevent another kidney stone: Drink enough fluid to keep your pee pale yellow. This is the best way to prevent kidney stones. Eat healthy foods. Avoid certain foods as told by your doctor. You may be told to eat less protein. Stay at a healthy weight. Where to find more information [facility]): www.kidney.[facility]): www.urologyhealth.org Contact a doctor  if: You have pain that gets worse or does not get better with medicine. Get help right away if: You have a fever or chills. You get very bad pain. You get new pain in your belly (abdomen). You pass out (faint). You cannot pee. Summary Kidney stones are rock-like masses that form inside of the kidneys. Kidney stones can cause very bad pain and can block the flow of pee. The stones will often pass out of the body through peeing. Drink enough fluid to keep your pee pale yellow. This information is not intended to replace advice given to you by your health care provider. Make sure you discuss any questions you have with your healthcare provider. Document Revised: 09/01/2018 Document Reviewed: 09/05/2018 Elsevier Patient Education  [place].

## 2020-12-08 NOTE — Progress Notes (Signed)
Urological Symptom Review  Patient is experiencing the following symptoms: Dialysis T,Th, &Sat Burning and pain with urination sometimes   Review of Systems  Gastrointestinal (upper)  : Negative for upper GI symptoms  Gastrointestinal (lower) : Negative for lower GI symptoms  Constitutional : Negative for symptoms  Skin: Negative for skin symptoms  Eyes: Negative for eye symptoms  Ear/Nose/Throat : Negative for Ear/Nose/Throat symptoms  Hematologic/Lymphatic: Negative for Hematologic/Lymphatic symptoms  Cardiovascular : Negative for cardiovascular symptoms  Respiratory : Negative for respiratory symptoms  Endocrine: Negative for endocrine symptoms  Musculoskeletal: Negative for musculoskeletal symptoms  Neurological: Negative for neurological symptoms  Psychologic: Negative for psychiatric symptoms

## 2020-12-08 NOTE — Progress Notes (Signed)
12/08/2020 11:00 AM   [name] [birth year] PT:[id number]  Referring provider: Julianne Handler, DO [address] [place],  [place] [address]  Left flank pain   HPI: Ms [name] Barnett is a 40yo  here for evaluation of left nephrolithiasis. She developed left flank pain 1 week ago and presented to the ER. SHe underwent CT which showed new left mild hydronephrosis and a likely 1-58m left UVJ calculus. No prior nephrolithiasis. She has ESRD and makes 4-5oz of urine daily. No gross hematuria. She denies any LUTS. No fevers. No hx of UTI. No flank pain currently   PMH: Past Medical History:  Diagnosis Date   [place]    Hyperlipidemia    Hypertension    Renal disorder    Stroke ([facility]    Vitamin D deficiency     Surgical History: Past Surgical History:  Procedure Laterality Date   cardiac stents     CATARACT EXTRACTION W/PHACO Left 08/29/2020   Procedure: CATARACT EXTRACTION PHACO AND INTRAOCULAR LENS PLACEMENT LEFT EYE;  Surgeon: WBaruch Goldmann MD;  Location: [facility];  Service: Ophthalmology;  Laterality: Left;  left CDE=29.30   CATARACT EXTRACTION W/PHACO Right 11/28/2020   Procedure: CATARACT EXTRACTION PHACO AND INTRAOCULAR LENS PLACEMENT (IOC);  Surgeon: WBaruch Goldmann MD;  Location: [facility];  Service: Ophthalmology;  Laterality: Right;  CDE 4.35   COLOSTOMY     TUBAL LIGATION     wound on buttocks      Home Medications:  Allergies as of 12/08/2020       Reactions   Corn-containing Products    Per MAR   Lisinopril    Per MAR   Morphine And Related    [facility]       Medication List        Accurate as of December 08, 2020 11:00 AM. If you have any questions, ask your nurse or doctor.          acetaminophen 325 MG tablet Commonly known as: TYLENOL Take 650 mg by mouth every 6 (six) hours as needed.   amLODipine 5 MG tablet Commonly known as: NORVASC Take 5 mg by mouth See admin instructions. Sun, Mon, Wed and  Fri   ascorbic acid 500 MG tablet Commonly known as: VITAMIN C Take 500 mg by mouth 2 (two) times daily.   aspirin EC 81 MG tablet Take 81 mg by mouth daily. Swallow whole.   atenolol 25 MG tablet Commonly known as: TENORMIN Take 0.5 tablets (12.5 mg total) by mouth daily. What changed:  when to take this additional instructions   atorvastatin 80 MG tablet Commonly known as: LIPITOR Take 1 tablet by mouth at bedtime.   b complex-vitamin c-folic acid 0.8 MG Tabs tablet Take 1 tablet by mouth daily.   Biotin 5 MG Tabs Take 5 mg by mouth in the morning and at bedtime.   calcium carbonate 500 MG chewable tablet Commonly known as: TUMS - dosed in mg elemental calcium Chew 1,000 mg by mouth 3 (three) times daily. Before meals   cefUROXime 500 MG tablet Commonly known as: CEFTIN Take 1 tablet (500 mg total) by mouth 2 (two) times daily with a meal.   CERTAGEN PO Take 1 tablet by mouth in the morning.   cetaphil lotion Apply 1 application topically 2 (two) times daily as needed for dry skin.   dicyclomine 10 MG capsule Commonly known as: BENTYL Take 10 mg by mouth 3 (three) times daily before meals.  HumaLOG 100 UNIT/ML injection Generic drug: insulin lispro Inject 5 Units into the skin 3 (three) times daily before meals.   Lantus 100 UNIT/ML injection Generic drug: insulin glargine Inject 10 Units into the skin in the morning.   Lidocaine 4 % Ptch Apply 1 application topically daily at 6 (six) AM.   lidocaine-prilocaine cream Commonly known as: EMLA Apply 1 application topically daily as needed. Apply to HD graft site right arm topically one every tues, thurs, sat for pain.   melatonin 3 MG Tabs tablet Take 3 mg by mouth at bedtime.   nepafenac 0.1 % ophthalmic suspension Commonly known as: [place] 1 drop into the right eye daily.   pantoprazole 40 MG tablet Commonly known as: PROTONIX Take 40 mg by mouth in the morning.   polyethylene glycol 17 g  packet Commonly known as: MIRALAX / GLYCOLAX Take 17 g by mouth daily.   prednisoLONE acetate 1 % ophthalmic suspension Commonly known as: PRED FORTE Place 1 drop into the right eye 2 (two) times daily.   rOPINIRole 2 MG tablet Commonly known as: REQUIP Take 2 mg by mouth at bedtime.   sennosides-docusate sodium 8.6-50 MG tablet Commonly known as: SENOKOT-S Take 2 tablets by mouth daily.   thiamine 100 MG tablet Take 100 mg by mouth daily.   tiZANidine 4 MG tablet Commonly known as: ZANAFLEX Take 4 mg by mouth at bedtime.   traMADol 50 MG tablet Commonly known as: ULTRAM Take 100 mg by mouth every 6 (six) hours as needed.   Vitamin D 50 MCG (2000 UT) Caps Take 2,000 Units by mouth daily.        Allergies:  Allergies  Allergen Reactions   Corn-Containing Products     Per MAR   Lisinopril     Per MAR   Morphine And Related     Per MAR    Family History: Family History  Problem Relation Age of Onset   Cancer Paternal Grandmother    Heart disease Father    Hypertension Father    Diabetes Father    Glaucoma Father    Heart disease Mother    Hypertension Mother    Diabetes Mother    Diabetes Sister        borderline   Hypertension Sister     Social History:  reports that she has never smoked. She has never used smokeless tobacco. She reports that she does not drink alcohol and does not use drugs.  ROS: All other review of systems were reviewed and are negative except what is noted above in HPI  Physical Exam: BP (!) 144/83   Pulse 87   Constitutional:  Alert and oriented, No acute distress. HEENT: [place] AT, moist mucus membranes.  Trachea midline, no masses. Cardiovascular: No clubbing, cyanosis, or edema. Respiratory: Normal respiratory effort, no increased work of breathing. GI: Abdomen is soft, nontender, nondistended, no abdominal masses GU: No CVA tenderness.  Lymph: No cervical or inguinal lymphadenopathy. Skin: No rashes, bruises or suspicious  lesions. Neurologic: Grossly intact, no focal deficits, moving all 4 extremities. Psychiatric: Normal mood and affect.  Laboratory Data: Lab Results  Component Value Date   WBC 14.6 (H) 12/01/2020   HGB 12.2 12/01/2020   HCT 39.0 12/01/2020   MCV 95.4 12/01/2020   PLT 396 12/01/2020    Lab Results  Component Value Date   CREATININE 8.68 (H) 12/01/2020    No results found for: PSA  No results found for: TESTOSTERONE  Lab Results  Component Value Date   HGBA1C 7.1 (H) 01/13/2020    Urinalysis    Component Value Date/Time   COLORURINE AMBER (A) 12/01/2020 2301   APPEARANCEUR TURBID (A) 12/01/2020 2301   LABSPEC 1.013 12/01/2020 2301   PHURINE 8.0 12/01/2020 2301   GLUCOSEU NEGATIVE 12/01/2020 2301   HGBUR MODERATE (A) 12/01/2020 2301   BILIRUBINUR NEGATIVE 12/01/2020 2301   KETONESUR NEGATIVE 12/01/2020 2301   PROTEINUR >=300 (A) 12/01/2020 2301   NITRITE NEGATIVE 12/01/2020 2301   LEUKOCYTESUR MODERATE (A) 12/01/2020 2301    Lab Results  Component Value Date   BACTERIA FEW (A) 12/01/2020    Pertinent Imaging: CT 12/01/2020: Images reviewed and discussed with the patient No results found for this or any previous visit.  Results for orders placed during the hospital encounter of 01/13/20  US Venous Img Lower Bilateral (DVT)  Narrative CLINICAL DATA:  Bilateral lower extremity edema  EXAM: BILATERAL LOWER EXTREMITY VENOUS DUPLEX ULTRASOUND  TECHNIQUE: Gray-scale sonography with graded compression, as well as color Doppler and duplex ultrasound were performed to evaluate the lower extremity deep venous systems from the level of the common femoral vein and including the common femoral, femoral, profunda femoral, popliteal and calf veins including the posterior tibial, peroneal and gastrocnemius veins when visible. The superficial great saphenous vein was also interrogated. Spectral Doppler was utilized to evaluate flow at rest and with distal augmentation  maneuvers in the common femoral, femoral and popliteal veins.  COMPARISON:  None.  FINDINGS: RIGHT LOWER EXTREMITY  Common Femoral Vein: No evidence of thrombus. Normal compressibility, respiratory phasicity and response to augmentation.  Saphenofemoral Junction: No evidence of thrombus. Normal compressibility and flow on color Doppler imaging.  Profunda Femoral Vein: No evidence of thrombus. Normal compressibility and flow on color Doppler imaging.  Femoral Vein: No evidence of thrombus. Normal compressibility, respiratory phasicity and response to augmentation.  Popliteal Vein: No evidence of thrombus. Normal compressibility, respiratory phasicity and response to augmentation.  Calf Veins: No evidence of thrombus. Normal compressibility and flow on color Doppler imaging.  Superficial Great Saphenous Vein: No evidence of thrombus. Normal compressibility.  Venous Reflux:  None.  Other Findings:  None.  LEFT LOWER EXTREMITY  Common Femoral Vein: No evidence of thrombus. Normal compressibility, respiratory phasicity and response to augmentation.  Saphenofemoral Junction: No evidence of thrombus. Normal compressibility and flow on color Doppler imaging.  Profunda Femoral Vein: No evidence of thrombus. Normal compressibility and flow on color Doppler imaging.  Femoral Vein: No evidence of thrombus. Normal compressibility, respiratory phasicity and response to augmentation.  Popliteal Vein: No evidence of thrombus. Normal compressibility, respiratory phasicity and response to augmentation.  Calf Veins: No evidence of thrombus. Normal compressibility and flow on color Doppler imaging.  Superficial Great Saphenous Vein: No evidence of thrombus. Normal compressibility.  Venous Reflux:  None.  Other Findings:  None.  IMPRESSION: No evidence of deep venous thrombosis in either lower extremity.   Electronically Signed By: Lowella Grip III M.D. On:  01/13/2020 10:24  No results found for this or any previous visit.  No results found for this or any previous visit.  No results found for this or any previous visit.  No results found for this or any previous visit.  No results found for this or any previous visit.  No results found for this or any previous visit.   Assessment & Plan:    1. Hydronephrosis, unspecified hydronephrosis type -likely related to left distal ureteral calculus - Urinalysis, Routine w reflex microscopic  2. Nephrolithiasis -We discussed the management of kidney stones. These options include observation, ureteroscopy, shockwave lithotripsy (ESWL) and percutaneous nephrolithotomy (PCNL). We discussed which options are relevant to the patient's stone(s). We discussed the natural history of kidney stones as well as the complications of untreated stones and the impact on quality of life without treatment as well as with each of the above listed treatments. We also discussed the efficacy of each treatment in its ability to clear the stone burden. With any of these management options I discussed the signs and symptoms of infection and the need for emergent treatment should these be experienced. For each option we discussed the ability of each procedure to clear the patient of their stone burden.   For observation I described the risks which include but are not limited to silent renal damage, life-threatening infection, need for emergent surgery, failure to pass stone and pain.   For ureteroscopy I described the risks which include bleeding, infection, damage to contiguous structures, positioning injury, ureteral stricture, ureteral avulsion, ureteral injury, need for prolonged ureteral stent, inability to perform ureteroscopy, need for an interval procedure, inability to clear stone burden, stent discomfort/pain, heart attack, stroke, pulmonary embolus and the inherent risks with general anesthesia.   For shockwave  lithotripsy I described the risks which include arrhythmia, kidney contusion, kidney hemorrhage, need for transfusion, pain, inability to adequately break up stone, inability to pass stone fragments, [place], infection associated with obstructing stones, need for alternate surgical procedure, need for repeat shockwave lithotripsy, MI, CVA, PE and the inherent risks with anesthesia/conscious sedation.   For PCNL I described the risks including positioning injury, pneumothorax, hydrothorax, need for chest tube, inability to clear stone burden, renal laceration, arterial venous fistula or malformation, need for embolization of kidney, loss of kidney or renal function, need for repeat procedure, need for prolonged nephrostomy tube, ureteral avulsion, MI, CVA, PE and the inherent risks of general anesthesia.   - The patient would like to proceed with medical expulsive therapt   No follow-ups on file.  Nicolette Bang, MD  [facility] [place]

## 2020-12-08 NOTE — Telephone Encounter (Signed)
[name] Barnett called Wondering how to get the ASO for pt?   CB [phone] ask to speak with her!

## 2020-12-15 ENCOUNTER — Encounter: Payer: Self-pay | Admitting: Diagnostic Neuroimaging

## 2020-12-15 ENCOUNTER — Ambulatory Visit (INDEPENDENT_AMBULATORY_CARE_PROVIDER_SITE_OTHER): Payer: Medicare Other | Admitting: Diagnostic Neuroimaging

## 2020-12-15 VITALS — BP 147/92 | HR 81 | Ht 65.0 in | Wt 187.0 lb

## 2020-12-15 DIAGNOSIS — I2510 Atherosclerotic heart disease of native coronary artery without angina pectoris: Secondary | ICD-10-CM | POA: Diagnosis not present

## 2020-12-15 DIAGNOSIS — R29898 Other symptoms and signs involving the musculoskeletal system: Secondary | ICD-10-CM | POA: Diagnosis not present

## 2020-12-15 NOTE — Patient Instructions (Signed)
BILATERAL (left worse than right) LEG WEAKNESS (due to combination of diabetic neuropathy, left lumbar radiculopathy, and left hemiparesis from stroke) - continue PT / OT exercises - continue medical mgmt of stroke risk factors (aspirin 81, statin, BP, DM control)

## 2020-12-15 NOTE — Progress Notes (Signed)
[facility]  PATIENT: [name] Barnett DOB: Sep 20, 1980  REFERRING CLINICIAN: [name], MD HISTORY FROM: patient  REASON FOR VISIT: new consult    HISTORICAL  CHIEF COMPLAINT:  Chief Complaint  Patient presents with   Back Pain    RM 6 Alone Pt states she is here to see If her leg not stretching out is a mental or physical issue. Its been 2 yrs since she has had a stroke    HISTORY OF PRESENT ILLNESS:   40 year old female with hypertension, diabetes, end-stage renal disease on dialysis, here for evaluation of left leg weakness.  Patient had developed diabetic neuropathy and left lumbar radiculopathy in 2019.  She was having some difficulty with ambulation but using a cane to get around.  In June 2020 she was admitted for confusion and diagnosed with stroke.  Following this she had increased left arm and left leg weakness.  She also developed a wound infection and osteomyelitis and discitis in the lumbar spine region, which was treated with wound care and antibiotics.  Since that time patient continues to have bilateral lower extremity weakness, left worse than right.  She is mainly using a wheelchair but able to walk short distances with assistance and a walker.  She still living at skilled nursing facility.   REVIEW OF SYSTEMS: Full 14 system review of systems performed and negative with exception of: as per HPI.  ALLERGIES: Allergies  Allergen Reactions   Corn-Containing Products     Per MAR   Lisinopril     Per MAR   Morphine And Related     Per [facility]    HOME MEDICATIONS: Outpatient Medications Prior to Visit  Medication Sig Dispense Refill   acetaminophen (TYLENOL) 325 MG tablet Take 650 mg by mouth every 6 (six) hours as needed.     amLODipine (NORVASC) 5 MG tablet Take 5 mg by mouth See admin instructions. Sun, Mon, Wed and Fri     ascorbic acid (VITAMIN C) 500 MG tablet Take 500 mg by mouth 2 (two) times daily.     aspirin EC 81 MG  tablet Take 81 mg by mouth daily. Swallow whole.     atenolol (TENORMIN) 25 MG tablet Take 0.5 tablets (12.5 mg total) by mouth daily. (Patient taking differently: Take 12.5 mg by mouth See admin instructions. Every Sun, Mon, Wed and Fri)     atorvastatin (LIPITOR) 80 MG tablet Take 1 tablet by mouth at bedtime.     b complex-vitamin c-folic acid (NEPHRO-VITE) 0.8 MG TABS tablet Take 1 tablet by mouth daily.     Biotin 5 MG TABS Take 5 mg by mouth in the morning and at bedtime.     calcium carbonate (TUMS - DOSED IN MG ELEMENTAL CALCIUM) 500 MG chewable tablet Chew 1,000 mg by mouth 3 (three) times daily. Before meals     cefUROXime (CEFTIN) 500 MG tablet Take 1 tablet (500 mg total) by mouth 2 (two) times daily with a meal. 14 tablet 0   cetaphil (CETAPHIL) lotion Apply 1 application topically 2 (two) times daily as needed for dry skin.     Cholecalciferol (VITAMIN D) 50 MCG (2000 UT) CAPS Take 2,000 Units by mouth daily.     dicyclomine (BENTYL) 10 MG capsule Take 10 mg by mouth 3 (three) times daily before meals.     HUMALOG 100 UNIT/ML injection Inject 5 Units into the skin 3 (three) times daily before meals.     LANTUS 100 UNIT/ML  injection Inject 10 Units into the skin in the morning.     Lidocaine 4 % PTCH Apply 1 application topically daily at 6 (six) AM.     lidocaine-prilocaine (EMLA) cream Apply 1 application topically daily as needed. Apply to HD graft site right arm topically one every tues, thurs, sat for pain.     melatonin 3 MG TABS tablet Take 3 mg by mouth at bedtime.     Multiple Vitamins-Minerals (CERTAGEN PO) Take 1 tablet by mouth in the morning.     nepafenac (NEVANAC) 0.1 % ophthalmic suspension Place 1 drop into the right eye daily.     ondansetron (ZOFRAN ODT) 4 MG disintegrating tablet Take 1 tablet (4 mg total) by mouth every 8 (eight) hours as needed for nausea or vomiting. 30 tablet 0   oxyCODONE-acetaminophen (PERCOCET) 5-325 MG tablet Take 1 tablet by mouth every 4  (four) hours as needed. 30 tablet 0   pantoprazole (PROTONIX) 40 MG tablet Take 40 mg by mouth in the morning.     polyethylene glycol (MIRALAX / GLYCOLAX) 17 g packet Take 17 g by mouth daily.     prednisoLONE acetate (PRED FORTE) 1 % ophthalmic suspension Place 1 drop into the right eye 2 (two) times daily.     rOPINIRole (REQUIP) 2 MG tablet Take 2 mg by mouth at bedtime.     sennosides-docusate sodium (SENOKOT-S) 8.6-50 MG tablet Take 2 tablets by mouth daily.     tamsulosin (FLOMAX) 0.4 MG CAPS capsule Take 1 capsule (0.4 mg total) by mouth daily. 30 capsule 0   thiamine 100 MG tablet Take 100 mg by mouth daily.     tiZANidine (ZANAFLEX) 4 MG tablet Take 4 mg by mouth at bedtime.     traMADol (ULTRAM) 50 MG tablet Take 100 mg by mouth every 6 (six) hours as needed.     No facility-administered medications prior to visit.    PAST MEDICAL HISTORY: Past Medical History:  Diagnosis Date   Diabetes mellitus without complication ([place])    Hyperlipidemia    Hypertension    Renal disorder    Stroke [facility])    Vitamin D deficiency     PAST SURGICAL HISTORY: Past Surgical History:  Procedure Laterality Date   cardiac stents     CATARACT EXTRACTION W/PHACO Left 08/29/2020   Procedure: CATARACT EXTRACTION PHACO AND INTRAOCULAR LENS PLACEMENT LEFT EYE;  Surgeon: Baruch Goldmann, MD;  Location: [facility];  Service: Ophthalmology;  Laterality: Left;  left CDE=29.30   CATARACT EXTRACTION W/PHACO Right 11/28/2020   Procedure: CATARACT EXTRACTION PHACO AND INTRAOCULAR LENS PLACEMENT (IOC);  Surgeon: Baruch Goldmann, MD;  Location: [facility];  Service: Ophthalmology;  Laterality: Right;  CDE 4.35   COLOSTOMY     TUBAL LIGATION     wound on buttocks      FAMILY HISTORY: Family History  Problem Relation Age of Onset   Cancer Paternal Grandmother    Heart disease Father    Hypertension Father    Diabetes Father    Glaucoma Father    Heart disease Mother    Hypertension Mother    Diabetes Mother     Diabetes Sister        borderline   Hypertension Sister     SOCIAL HISTORY: Social History   Socioeconomic History   Marital status: Legally Separated    Spouse name: Not on file   Number of children: Not on file   Years of education: Not on file   Highest  education level: Not on file  Occupational History   Not on file  Tobacco Use   Smoking status: Never   Smokeless tobacco: Never  Vaping Use   Vaping Use: Never used  Substance and Sexual Activity   Alcohol use: Never   Drug use: Never   Sexual activity: Not Currently    Birth control/protection: Surgical    Comment: tubal  Other Topics Concern   Not on file  Social History Narrative   Not on file   Social Determinants of Health   Financial Resource Strain: Low Risk    Difficulty of Paying Living Expenses: Not very hard  Food Insecurity: No Food Insecurity   Worried About Running Out of Food in the Last Year: Never true   Ran Out of Food in the Last Year: Never true  Transportation Needs: No Transportation Needs   Lack of Transportation (Medical): No   Lack of Transportation (Non-Medical): No  Physical Activity: Insufficiently Active   Days of Exercise per Week: 3 days   Minutes of Exercise per Session: 30 min  Stress: No Stress Concern Present   Feeling of Stress : Not at all  Social Connections: Moderately Isolated   Frequency of Communication with Friends and Family: More than three times a week   Frequency of Social Gatherings with Friends and Family: Never   Attends Religious Services: More than 4 times per year   Active Member of [employer] or Organizations: No   Attends Archivist Meetings: Never   Marital Status: Separated  Intimate Partner Violence: Not At Risk   Fear of Current or Ex-Partner: No   Emotionally Abused: No   Physically Abused: No   Sexually Abused: No     PHYSICAL EXAM  GENERAL EXAM/CONSTITUTIONAL: Vitals:  Vitals:   12/15/20 0852  BP: (!) 147/92  Pulse: 81   Weight: 187 lb (84.8 kg)  Height: '5\' 5"'$  (1.651 m)   Body mass index is 31.12 kg/m. Wt Readings from Last 3 Encounters:  12/15/20 187 lb (84.8 kg)  12/05/20 173 lb (78.5 kg)  12/01/20 173 lb 2.2 oz (78.5 kg)   Patient is in no distress; well developed, nourished and groomed; neck is supple  CARDIOVASCULAR: Examination of carotid arteries is normal; no carotid bruits Regular rate and rhythm, no murmurs Examination of peripheral vascular system by observation and palpation is normal  EYES: Ophthalmoscopic exam of optic discs and posterior segments is normal; no papilledema or hemorrhages No results found.  MUSCULOSKELETAL: Gait, strength, tone, movements noted in Neurologic exam below  NEUROLOGIC: MENTAL STATUS:  No flowsheet data found. awake, alert, oriented to person, place and time recent and remote memory intact normal attention and concentration language fluent, comprehension intact, naming intact fund of knowledge appropriate  CRANIAL NERVE:  2nd - no papilledema on fundoscopic exam 2nd, 3rd, 4th, 6th - pupils equal and reactive to light, visual fields full to confrontation, extraocular muscles intact, no nystagmus 5th - facial sensation symmetric 7th - facial strength symmetric 8th - hearing intact 9th - palate elevates symmetrically, uvula midline 11th - shoulder shrug symmetric 12th - tongue protrusion midline MILD DYSARTHRIA  MOTOR:  BUE 4+ RLE 4+ PROX, 3-4 DISTAL LLE 4 PROX, 1-2 DISTAL (DF, INV, EVER); DECR TONE IN LLE  SENSORY:  normal and symmetric to light touch, pinprick, temperature, vibration; EXCEPT DECR IN BLE BELOW KNEES  COORDINATION:  finger-nose-finger, fine finger movements normal  REFLEXES:  deep tendon reflexes TRACE and symmetric; ABSENT AT ANKLES  GAIT/STATION:  IN WHEELCHAIR; NEEDS ASSISTANCE TO STAND     DIAGNOSTIC DATA (LABS, IMAGING, TESTING) - I reviewed patient records, labs, notes, testing and imaging myself where  available.  Lab Results  Component Value Date   WBC 14.6 (H) 12/01/2020   HGB 12.2 12/01/2020   HCT 39.0 12/01/2020   MCV 95.4 12/01/2020   PLT 396 12/01/2020      Component Value Date/Time   NA 139 12/01/2020 1753   K 4.6 12/01/2020 1753   CL 97 (L) 12/01/2020 1753   CO2 22 12/01/2020 1753   GLUCOSE 104 (H) 12/01/2020 1753   BUN 58 (H) 12/01/2020 1753   CREATININE 8.68 (H) 12/01/2020 1753   CALCIUM 9.1 12/01/2020 1753   PROT 9.3 (H) 12/01/2020 1753   ALBUMIN 4.4 12/01/2020 1753   AST 8 (L) 12/01/2020 1753   ALT 17 12/01/2020 1753   ALKPHOS 148 (H) 12/01/2020 1753   BILITOT 0.9 12/01/2020 1753   GFRNONAA 5 (L) 12/01/2020 1753   GFRAA 7 (L) 01/16/2020 0617   No results found for: CHOL, HDL, LDLCALC, LDLDIRECT, TRIG, CHOLHDL Lab Results  Component Value Date   HGBA1C 7.1 (H) 01/13/2020   No results found for: [id number] Lab Results  Component Value Date   TSH 0.61 09/02/2020   10/20/18 MRI brain Subcentimeter focus of restricted diffusion involving the genu of the  corpus callosum is suspicious for acute infarction. Note assessment is  limited secondary to patient motion.   12/08/18 MRI lumbar spine 1. Straightening of normal lumbar lordosis. No acute fracture or subluxation.  2. Abnormal appearance of the L4-L5 disc space as described with findings worrisome for discitis. There is paraspinal soft tissue and heterogeneous fluid about L4-L5 extending into the epidural space at L4-L5. This causes severe bilateral neural foraminal narrowing and mild central canal narrowing.  3. 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  4. Mild lumbar spine degenerative changes otherwise as described are most pronounced at L5-S1 with mild narrowing of the left neural foramen and mild right neural foraminal narrowing.  5. Findings discussed with Carmell Austria, PA at [phone] hours on 12/08/2018.   03/07/19 CT lumbar  spine - Findings highly suspicious for discitis osteomyelitis at the L4-L5 disc space.  09/03/20 MRI lumbar spine  1. Sequela of prior discitis/osteomyelitis at L4-5 with endplate spurring and facet degenerative changes resulting in moderate right and moderate to severe left neural foraminal narrowing at this level. 2. Degenerative disc disease at L5-S1 resulting in mild right and moderate left neural foraminal narrowing.    ASSESSMENT AND PLAN  40 y.o. year old female here with hypertension, diabetes, end-stage renal disease, stroke, lumbar radiculopathies, diabetic neuropathy:   Dx:  1. Weakness of both lower extremities      PLAN:  BILATERAL (left worse than right) LEG WEAKNESS (due to combination of diabetic neuropathy, left lumbar radiculopathy, and left hemiparesis from stroke) - continue PT / OT exercises - continue medical mgmt of stroke risk factors (aspirin 81, statin, BP, DM control)  Return for return to PCP, pending if symptoms worsen or fail to improve.    Penni Bombard, MD [id number], [id number] AM Certified in Neurology, Neurophysiology and Neuroimaging  [facility] [address] [place], [place] [address] [phone]

## 2020-12-17 ENCOUNTER — Telehealth: Payer: Self-pay | Admitting: Specialist

## 2020-12-17 NOTE — Telephone Encounter (Signed)
[facility] in [place] called and would like to know where we got the recommendation from this pt from on 10/27/20?  CB [phone]

## 2020-12-17 NOTE — Telephone Encounter (Signed)
FYI----I called and spoke with facility, the PT department there is saying she is not ready for a rollator at this time, they don't think that she is capable of being able to turn and sit when she gets tired. They say that they are using a wheelchair behind her while she is using the rollator so he can just down.  They are going to send a copy of their recommendation to her appt on Friday for Dr. Louanne Skye to review

## 2020-12-19 ENCOUNTER — Ambulatory Visit (INDEPENDENT_AMBULATORY_CARE_PROVIDER_SITE_OTHER): Payer: Medicare Other | Admitting: Specialist

## 2020-12-19 ENCOUNTER — Ambulatory Visit (HOSPITAL_COMMUNITY)
Admission: RE | Admit: 2020-12-19 | Discharge: 2020-12-19 | Disposition: A | Discharge status: Home / Self Care | Payer: Medicare Other | Source: Ambulatory Visit | Attending: Urology | Admitting: Urology

## 2020-12-19 ENCOUNTER — Other Ambulatory Visit: Payer: Self-pay

## 2020-12-19 ENCOUNTER — Encounter: Payer: Self-pay | Admitting: Specialist

## 2020-12-19 VITALS — BP 147/91 | HR 81 | Ht 65.0 in | Wt 187.0 lb

## 2020-12-19 DIAGNOSIS — N200 Calculus of kidney: Secondary | ICD-10-CM | POA: Diagnosis present

## 2020-12-19 DIAGNOSIS — E1142 Type 2 diabetes mellitus with diabetic polyneuropathy: Secondary | ICD-10-CM

## 2020-12-19 DIAGNOSIS — M21372 Foot drop, left foot: Secondary | ICD-10-CM

## 2020-12-19 DIAGNOSIS — M4726 Other spondylosis with radiculopathy, lumbar region: Secondary | ICD-10-CM | POA: Diagnosis not present

## 2020-12-19 DIAGNOSIS — I2510 Atherosclerotic heart disease of native coronary artery without angina pectoris: Secondary | ICD-10-CM | POA: Diagnosis not present

## 2020-12-19 DIAGNOSIS — R29898 Other symptoms and signs involving the musculoskeletal system: Secondary | ICD-10-CM

## 2020-12-19 DIAGNOSIS — I69354 Hemiplegia and hemiparesis following cerebral infarction affecting left non-dominant side: Secondary | ICD-10-CM | POA: Diagnosis not present

## 2020-12-19 NOTE — Patient Instructions (Signed)
Plan: Obtain a walker with seat and wheels for ambulation. Obtain a left ankle foot orthosis with articulated ankle to allow dorsiflexion of the left foot and restrict plantar flexion.  If antibiotics have returned her to baseline laboratory, normal CBC and no clinical markers of active infection then continue to follow conservatively. I believe that surgical solution in her situation has risks of weak bone resulting in hardware loosening, 5% risk of reactivation of the discitis and osteomyelitis and there is no guarantee that strength will improve. The likelihood of improvement in back pain is about 60-70%. Surgery is dependent on whether she is not able to cope with her current condition and wishes to try for improvement in her standing and walking tolerance. So far she continues to make gains and does so in spite of changes in the lumbar spine. I will order EMG/NCV by Dr. Ernestina Patches since the neurologist has not performed or schedule them.

## 2020-12-19 NOTE — Progress Notes (Addendum)
Office Visit Note   Patient: [name] Barnett           Date of Birth: 1980/06/27           MRN: [id number] Visit Date: 12/19/2020              Requested by: Julianne Handler, DO [address] [place],  [place] [id number] PCP: Julianne Handler, DO   Assessment & Plan: Visit Diagnoses:  1. Other spondylosis with radiculopathy, lumbar region   2. Hemiparesis affecting left side as late effect of cerebrovascular accident ([place])   3. DM type 2 with diabetic peripheral neuropathy ([place])   40 year old female with history of CVA and left leg left and left foot drop. She has been assessed by neurology, Dr. Leta Baptist and he has indicated that the left foot drop is likely the result of all three conditions she has including DM type 2 with neuropathy,  Neuroforamenal narrowing due to affect of discitis and degeneration bilaterally at  L4-5 and L5-S1 and her previous CVA. I have been asked to assess whether surgical solution is indicated. I have explained to her that the weakness she has is likely not all  Due to her lumbar spine and surgery will not make her legs strong, surgery may however improve her standing and walking tolerance and relieve pain related to the narrowing of the neuroforamen at L4-5 and L5-S1. Having a EMG/NCV would help to determine whether an overlying predominant lumbar radiculopathy is present in which case intervention may be indicated however she has numerous risk factors that suggest surgery is more likely to be associated with greater risk of complication, osteopenia associated with renal disease increases risk of hardware failure and both dialysis dependance and diabetes relate to increased risk for infection, surgery in an area of previous discitis and osteomyelitis has a 5% risk of reactivation of infection at the surgical site.  None the less understanding the severity of the lumbar nerve compression is important to allow for  counselling her as to the  prognosis of her leg weakness and risk of progression. I am Recommending that she get the left AFO and I agree with her having a standing walker with a seat. EMG ordered with Dr. Ernestina Patches, physiatrist here at [facility].    Plan: Obtain a walker with seat and wheels for ambulation. Obtain a left ankle foot orthosis with articulated ankle to allow dorsiflexion of the left foot and restrict plantar flexion.  If antibiotics have returned her to baseline laboratory, normal CBC and no clinical markers of active infection then continue to follow conservatively. I believe that surgical solution in her situation has risks of weak bone resulting in hardware loosening, 5% risk of reactivation of the discitis and osteomyelitis and there is no guarantee that strength will improve. The likelihood of improvement in back pain is about 60-70%. Surgery is dependent on whether she is not able to cope with her current condition and wishes to try for improvement in her standing and walking tolerance. So far she continues to make gains and does so in spite of changes in the lumbar spine. I will order EMG/NCV by Dr. Ernestina Patches since the neurologist has not performed or schedule them.     Follow-Up Instructions: No follow-ups on file.   Orders:  Orders Placed This Encounter  Procedures   For home use only DME 4 wheeled rolling walker with seat   No orders of the defined types were placed in this encounter.  Procedures: No procedures performed   Clinical Data: No additional findings.   Subjective: Chief Complaint  Patient presents with   Left Foot - Follow-up    40 year old female with history of stroke and L4-5 discitis-osteomyelitis due to sepsis and UTI. She is in a SNF [facility] in [place], [place] though most of her family lives in [place]. She had hospitalization in  [place] and eventually was transferred to SNF in [facility], nearly 100 miles to the [place] of her home town. She  is actively seeking to  Get to a place closer to home but in the meanwhile she was referred to our practice for follow up of her lumbar condition. She has a left hemiparesis due to stroke and complaints of back pain due to severe erosive changes and DDD L4-5 from previously treated discitis. She has been diagnosed with a left foot drop that is likely CVA related and has recently seen Neurology.    Review of Systems  Constitutional: Negative.   HENT: Negative.    Eyes: Negative.   Respiratory: Negative.    Cardiovascular: Negative.   Gastrointestinal: Negative.   Endocrine: Negative.   Genitourinary: Negative.   Musculoskeletal: Negative.   Skin: Negative.   Allergic/Immunologic: Negative.   Neurological: Negative.   Hematological: Negative.   Psychiatric/Behavioral: Negative.      Objective: Vital Signs: BP (!) 147/91   Pulse 81   Ht '5\' 5"'$  (1.651 m)   Wt 187 lb (84.8 kg)   BMI 31.12 kg/m   Physical Exam Constitutional:      Appearance: She is well-developed.  HENT:     Head: Normocephalic and atraumatic.  Eyes:     Pupils: Pupils are equal, round, and reactive to light.  Pulmonary:     Effort: Pulmonary effort is normal.     Breath sounds: Normal breath sounds.  Abdominal:     General: Bowel sounds are normal.     Palpations: Abdomen is soft.  Musculoskeletal:     Cervical back: Normal range of motion and neck supple.  Skin:    General: Skin is warm and dry.  Neurological:     Mental Status: She is alert and oriented to person, place, and time.  Psychiatric:        Behavior: Behavior normal.        Thought Content: Thought content normal.        Judgment: Judgment normal.   Back Exam   Tenderness  The patient is experiencing tenderness in the lumbar.  Range of Motion  Extension:  abnormal  Flexion:  abnormal  Lateral bend right:  abnormal  Lateral bend left:  abnormal  Rotation right:  abnormal  Rotation left:  abnormal   Muscle Strength  Right  Quadriceps:  5/5  Left Quadriceps:  4/5  Right Hamstrings:  5/5  Left Hamstrings:  4/5   Other  Toe walk: normal Heel walk: normal Gait: normal  Erythema: no back redness Scars: absent    Specialty Comments:  No specialty comments available.  Imaging: No results found.   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[place]) 01/13/2020  Essential hypertension 01/13/2020   Hyperlipidemia 01/13/2020   Type 2 diabetes mellitus ([place]) 01/13/2020   Past Medical History:  Diagnosis Date   Diabetes mellitus without complication ([place])    Hyperlipidemia    Hypertension    Renal disorder    Stroke ([place])    Vitamin D deficiency     Family History  Problem Relation Age of Onset   Cancer Paternal Grandmother    Heart disease Father    Hypertension Father    Diabetes Father    Glaucoma Father    Heart disease Mother    Hypertension Mother    Diabetes Mother    Diabetes Sister        borderline   Hypertension Sister     Past Surgical History:  Procedure Laterality Date   cardiac stents     CATARACT EXTRACTION W/PHACO Left 08/29/2020   Procedure: CATARACT EXTRACTION PHACO AND INTRAOCULAR LENS PLACEMENT LEFT EYE;  Surgeon: Baruch Goldmann, MD;  Location: [facility];  Service: Ophthalmology;  Laterality: Left;  left CDE=29.30   CATARACT EXTRACTION W/PHACO Right 11/28/2020   Procedure: CATARACT EXTRACTION PHACO AND INTRAOCULAR LENS PLACEMENT (IOC);  Surgeon: Baruch Goldmann, MD;  Location: [facility];  Service: Ophthalmology;  Laterality: Right;  CDE 4.35   COLOSTOMY     TUBAL LIGATION     wound on buttocks     Social History    Occupational History   Not on file  Tobacco Use   Smoking status: Never   Smokeless tobacco: Never  Vaping Use   Vaping Use: Never used  Substance and Sexual Activity   Alcohol use: Never   Drug use: Never   Sexual activity: Not Currently    Birth control/protection: Surgical    Comment: tubal

## 2020-12-23 ENCOUNTER — Other Ambulatory Visit: Payer: Self-pay | Admitting: Diagnostic Neuroimaging

## 2020-12-23 ENCOUNTER — Encounter: Payer: Self-pay | Admitting: Diagnostic Neuroimaging

## 2020-12-23 DIAGNOSIS — R29898 Other symptoms and signs involving the musculoskeletal system: Secondary | ICD-10-CM

## 2020-12-23 NOTE — Progress Notes (Signed)
Orders Placed This Encounter  Procedures   NCV with EMG(electromyography)    Penni Bombard, MD [id number], [id number] PM Certified in Neurology, Neurophysiology and Neuroimaging  [facility] [address] [place], [place] [address] [phone]

## 2020-12-24 ENCOUNTER — Other Ambulatory Visit: Payer: Self-pay

## 2020-12-24 ENCOUNTER — Ambulatory Visit (INDEPENDENT_AMBULATORY_CARE_PROVIDER_SITE_OTHER): Payer: Medicare Other | Admitting: Nurse Practitioner

## 2020-12-24 ENCOUNTER — Encounter: Payer: Self-pay | Admitting: Nurse Practitioner

## 2020-12-24 VITALS — BP 118/79 | HR 86 | Ht 65.0 in | Wt 187.0 lb

## 2020-12-24 DIAGNOSIS — E0590 Thyrotoxicosis, unspecified without thyrotoxic crisis or storm: Secondary | ICD-10-CM

## 2020-12-24 DIAGNOSIS — R7989 Other specified abnormal findings of blood chemistry: Secondary | ICD-10-CM | POA: Diagnosis not present

## 2020-12-24 MED ORDER — PREDNISONE 5 MG PO TABS: 5.0000 mg | ORAL_TABLET | Freq: Every day | ORAL | 0 refills | Status: DC

## 2020-12-24 NOTE — Progress Notes (Signed)
12/24/2020     Endocrinology Follow Up Note    Subjective:    Patient ID: [name] Barnett, female    DOB: [birth year], PCP Sitafalwalla, [place], DO.   Past Medical History:  Diagnosis Date   Diabetes mellitus without complication ([place])    Hyperlipidemia    Hypertension    Renal disorder    Stroke [facility])    Vitamin D deficiency     Past Surgical History:  Procedure Laterality Date   cardiac stents     CATARACT EXTRACTION W/PHACO Left 08/29/2020   Procedure: CATARACT EXTRACTION PHACO AND INTRAOCULAR LENS PLACEMENT LEFT EYE;  Surgeon: Baruch Goldmann, MD;  Location: [facility];  Service: Ophthalmology;  Laterality: Left;  left CDE=29.30   CATARACT EXTRACTION W/PHACO Right 11/28/2020   Procedure: CATARACT EXTRACTION PHACO AND INTRAOCULAR LENS PLACEMENT (IOC);  Surgeon: Baruch Goldmann, MD;  Location: [facility];  Service: Ophthalmology;  Laterality: Right;  CDE 4.35   COLOSTOMY     TUBAL LIGATION     wound on buttocks      Social History   Socioeconomic History   Marital status: Legally Separated    Spouse name: Not on file   Number of children: Not on file   Years of education: Not on file   Highest education level: Not on file  Occupational History   Not on file  Tobacco Use   Smoking status: Never   Smokeless tobacco: Never  Vaping Use   Vaping Use: Never used  Substance and Sexual Activity   Alcohol use: Never   Drug use: Never   Sexual activity: Not Currently    Birth control/protection: Surgical    Comment: tubal  Other Topics Concern   Not on file  Social History Narrative   Not on file   Social Determinants of Health   Financial Resource Strain: Low Risk    Difficulty of Paying Living Expenses: Not very hard  Food Insecurity: No Food Insecurity   Worried About Running Out of Food in the Last Year: Never true   [place] in the Last Year: Never true  Transportation Needs: No Transportation Needs   Lack of Transportation (Medical): No    Lack of Transportation (Non-Medical): No  Physical Activity: Insufficiently Active   Days of Exercise per Week: 3 days   Minutes of Exercise per Session: 30 min  Stress: No Stress Concern Present   Feeling of Stress : Not at all  Social Connections: Moderately Isolated   Frequency of Communication with Friends and Family: More than three times a week   Frequency of Social Gatherings with Friends and Family: Never   Attends Religious Services: More than 4 times per year   Active Member of [employer] or Organizations: No   Attends Archivist Meetings: Never   Marital Status: Separated    Family History  Problem Relation Age of Onset   Cancer Paternal Grandmother    Heart disease Father    Hypertension Father    Diabetes Father    Glaucoma Father    Heart disease Mother    Hypertension Mother    Diabetes Mother    Diabetes Sister        borderline   Hypertension Sister     Outpatient Encounter Medications as of 12/24/2020  Medication Sig   predniSONE (DELTASONE) 5 MG tablet Take 1 tablet (5 mg total) by mouth daily with breakfast for 15 days.   acetaminophen (TYLENOL) 325 MG tablet Take 650  mg by mouth every 6 (six) hours as needed.   amLODipine (NORVASC) 5 MG tablet Take 5 mg by mouth See admin instructions. Sun, Mon, Wed and Fri   ascorbic acid (VITAMIN C) 500 MG tablet Take 500 mg by mouth 2 (two) times daily.   aspirin EC 81 MG tablet Take 81 mg by mouth daily. Swallow whole.   atenolol (TENORMIN) 25 MG tablet Take 0.5 tablets (12.5 mg total) by mouth daily. (Patient taking differently: Take 12.5 mg by mouth See admin instructions. Every Sun, Mon, Wed and Fri)   atorvastatin (LIPITOR) 80 MG tablet Take 1 tablet by mouth at bedtime.   b complex-vitamin c-folic acid (NEPHRO-VITE) 0.8 MG TABS tablet Take 1 tablet by mouth daily.   Biotin 5 MG TABS Take 5 mg by mouth in the morning and at bedtime.   calcium carbonate (TUMS - DOSED IN MG ELEMENTAL CALCIUM) 500 MG chewable  tablet Chew 1,000 mg by mouth 3 (three) times daily. Before meals   cefUROXime (CEFTIN) 500 MG tablet Take 1 tablet (500 mg total) by mouth 2 (two) times daily with a meal.   cetaphil (CETAPHIL) lotion Apply 1 application topically 2 (two) times daily as needed for dry skin.   Cholecalciferol (VITAMIN D) 50 MCG (2000 UT) CAPS Take 2,000 Units by mouth daily.   dicyclomine (BENTYL) 10 MG capsule Take 10 mg by mouth 3 (three) times daily before meals.   HUMALOG 100 UNIT/ML injection Inject 5 Units into the skin 3 (three) times daily before meals.   LANTUS 100 UNIT/ML injection Inject 10 Units into the skin in the morning.   Lidocaine 4 % PTCH Apply 1 application topically daily at 6 (six) AM.   lidocaine-prilocaine (EMLA) cream Apply 1 application topically daily as needed. Apply to HD graft site right arm topically one every tues, thurs, sat for pain.   melatonin 3 MG TABS tablet Take 3 mg by mouth at bedtime.   Multiple Vitamins-Minerals (CERTAGEN PO) Take 1 tablet by mouth in the morning.   nepafenac (NEVANAC) 0.1 % ophthalmic suspension Place 1 drop into the right eye daily.   ondansetron (ZOFRAN ODT) 4 MG disintegrating tablet Take 1 tablet (4 mg total) by mouth every 8 (eight) hours as needed for nausea or vomiting.   oxyCODONE-acetaminophen (PERCOCET) 5-325 MG tablet Take 1 tablet by mouth every 4 (four) hours as needed.   pantoprazole (PROTONIX) 40 MG tablet Take 40 mg by mouth in the morning.   polyethylene glycol (MIRALAX / GLYCOLAX) 17 g packet Take 17 g by mouth daily.   prednisoLONE acetate (PRED FORTE) 1 % ophthalmic suspension Place 1 drop into the right eye 2 (two) times daily.   rOPINIRole (REQUIP) 2 MG tablet Take 2 mg by mouth at bedtime.   sennosides-docusate sodium (SENOKOT-S) 8.6-50 MG tablet Take 2 tablets by mouth daily.   tamsulosin (FLOMAX) 0.4 MG CAPS capsule Take 1 capsule (0.4 mg total) by mouth daily.   thiamine 100 MG tablet Take 100 mg by mouth daily.   tiZANidine  (ZANAFLEX) 4 MG tablet Take 4 mg by mouth at bedtime.   traMADol (ULTRAM) 50 MG tablet Take 100 mg by mouth every 6 (six) hours as needed.   No facility-administered encounter medications on file as of 12/24/2020.    ALLERGIES: Allergies  Allergen Reactions   Corn-Containing Products     Per MAR   Lisinopril     Per MAR   Morphine And Related     Per [facility]  VACCINATION STATUS:  There is no immunization history on file for this patient.   HPI  [name] Barnett is 40 y.o. female who presents today with a medical history as above. she is being seen in follow up after being seen in consultation for hyperthyroidism requested by Sitafalwalla, [place], DO.  she has been dealing with symptoms of intermittent palpitations, tremors, insomnia, and intermittent diarrhea for a few months now. These symptoms are progressively worsening and troubling to her.  her most recent thyroid labs revealed suppressed TSH of 0.198, T4 of 9.4, T3 uptake of 46, and Free Thyroxine Index of 4.3 on 08/14/20. she denies dysphagia, choking, shortness of breath, no recent voice change.    she reports family history of thyroid dysfunction in her mother (unsure of what kind), but denies family hx of thyroid cancer. she does have personal history of goiter. she is not on any anti-thyroid medications nor on any thyroid hormone supplements. Denies use of Biotin containing supplements (used to take it years ago but has not since).  she is willing to proceed with appropriate work up and therapy for thyrotoxicosis.   Review of systems  Constitutional: + Minimally fluctuating body weight, current Body mass index is 28.8 kg/m., no fatigue, no subjective hyperthermia, no subjective hypothermia Eyes: + blurry vision , no xerophthalmia ENT: no sore throat, no nodules palpated in throat, no dysphagia/odynophagia, no hoarseness Cardiovascular: no chest pain, no shortness of breath, intermittent palpitations-slightly worse, +  leg swelling Respiratory: no cough, no shortness of breath Gastrointestinal: no nausea/vomiting, intermittent diarrhea  Musculoskeletal: no muscle/joint aches, wc bound due to previous stroke Skin: no rashes, no hyperemia Neurological: mild intermittent tremors,no numbness, no tingling, no dizziness Psychiatric: no depression, no anxiety, insomnia   Objective:    BP 118/79   Pulse 86   Ht '5\' 5"'$  (1.651 m)   Wt 187 lb (84.8 kg) Comment: weight on 12-19-20  BMI 31.12 kg/m   Wt Readings from Last 3 Encounters:  12/24/20 187 lb (84.8 kg)  12/19/20 187 lb (84.8 kg)  12/15/20 187 lb (84.8 kg)     BP Readings from Last 3 Encounters:  12/24/20 118/79  12/19/20 (!) 147/91  12/15/20 (!) 147/92                       Physical Exam- Limited  Constitutional:  Body mass index is 28.8 kg/m. , not in acute distress, normal state of mind Eyes:  EOMI, no exophthalmos Neck: Supple Cardiovascular: RRR, no murmers, rubs, or gallops, 2+ pitting edema to BLE Respiratory: Adequate breathing efforts, no crackles, rales, rhonchi, or wheezing Musculoskeletal: no gross deformities, strength intact in all four extremities, no gross restriction of joint movements, in wc due to previous CVA Skin:  no rashes, no hyperemia Neurological: no tremor with outstretched hands    CMP     Component Value Date/Time   NA 139 12/01/2020 1753   K 4.6 12/01/2020 1753   CL 97 (L) 12/01/2020 1753   CO2 22 12/01/2020 1753   GLUCOSE 104 (H) 12/01/2020 1753   BUN 58 (H) 12/01/2020 1753   CREATININE 8.68 (H) 12/01/2020 1753   CALCIUM 9.1 12/01/2020 1753   PROT 9.3 (H) 12/01/2020 1753   ALBUMIN 4.4 12/01/2020 1753   AST 8 (L) 12/01/2020 1753   ALT 17 12/01/2020 1753   ALKPHOS 148 (H) 12/01/2020 1753   BILITOT 0.9 12/01/2020 1753   GFRNONAA 5 (L) 12/01/2020 1753   GFRAA 7 (L) 01/16/2020 [id number]  CBC    Component Value Date/Time   WBC 14.6 (H) 12/01/2020 1753   RBC 4.09 12/01/2020 1753   HGB 12.2  12/01/2020 1753   HCT 39.0 12/01/2020 1753   PLT 396 12/01/2020 1753   MCV 95.4 12/01/2020 1753   MCH 29.8 12/01/2020 1753   MCHC 31.3 12/01/2020 1753   RDW 14.6 12/01/2020 1753   LYMPHSABS 0.9 12/01/2020 1753   MONOABS 0.5 12/01/2020 1753   EOSABS 0.0 12/01/2020 1753   BASOSABS 0.0 12/01/2020 1753     Diabetic Labs (most recent): Lab Results  Component Value Date   HGBA1C 7.1 (H) 01/13/2020   HGBA1C 6.2 (H) 02/08/2019    Lipid Panel  No results found for: CHOL, TRIG, HDL, CHOLHDL, VLDL, LDLCALC, LDLDIRECT, LABVLDL   Lab Results  Component Value Date   TSH 0.61 09/02/2020   TSH 0.20 (A) 08/14/2020    09/02/20 0000   Result status: Final  Resulting lab: [id number]  Reference range: 0.41 - 5.90  Value: 0.61  Comment: T4,Free 1.49, Anti-Thyroglobulin Antibodies <1.0   Uptake and Scan from 09/18/20 CLINICAL DATA:  Difficulty sleeping, hair loss, increased thirst, heart palpitations, fatigue, weakness, edema suppressed TSH, suspected hyperthyroidism   EXAM: THYROID SCAN AND UPTAKE - 4 AND 24 HOURS   TECHNIQUE: Following oral administration of I-123 capsule, anterior planar imaging was acquired at 24 hours. Thyroid uptake was calculated with a thyroid probe at 4-6 hours and 24 hours.   RADIOPHARMACEUTICALS:  310 uCi I-123 sodium iodide p.o.   COMPARISON:  None   FINDINGS: Homogeneous tracer distribution in both thyroid lobes.   No focal areas of increased or decreased tracer localization.   4 hour I-123 uptake = 4.3% (normal 5-20%)   24 hour I-123 uptake = 10.5% (normal 10-30%)   IMPRESSION: Normal thyroid scan.   Low normal 24 hour radio iodine uptake.     Electronically Signed   By: Lavonia Dana M.D.   On: 09/18/2020 11:03    Assessment & Plan:   1. Hyperthyroidism  she is being seen at a kind request of [place], [place], DO.  Her repeat thyroid function tests show evidence of recurrence of acute thyroiditis given her suppressed TSH and  elevated FT4 levels.  She also states her initial symptoms have recently gotten a bit worse (where as they had been improving).  She did have positive TPO antibody testing which increases her risk of developing Hashimoto's or Graves disease.     -Her uptake and scan was low normal which also supports diagnosis of thyroiditis.  I did initiate short course of oral steroids to help with her symptoms, Prednisone 5 mg po daily x 15 days.  I will have her return in 7 weeks with return labs to evaluate her response.  She is already on beta blocker.  -Patient is advised to maintain close follow up with [place], [place], DO for primary care needs.     I spent 30 minutes in the care of the patient today including review of labs from Thyroid Function, CMP, and other relevant labs ; imaging/biopsy records (current and previous including abstractions from other facilities); face-to-face time discussing  her lab results and symptoms, medications doses, her options of short and long term treatment based on the latest standards of care / guidelines;   and documenting the encounter.  [place]  participated in the discussions, expressed understanding, and voiced agreement with the above plans.  All questions were answered to her satisfaction. she is encouraged to  contact clinic should she have any questions or concerns prior to her return visit.  Follow up plan: Return in about 7 weeks (around 02/11/2021) for Thyroid follow up, Previsit labs.   Thank you for involving me in the care of this pleasant patient, and I will continue to update you with her progress.  Rayetta Pigg, [facility] [address] [place], [place] [address] Phone: [phone] Fax: [phone]  12/24/2020, 3:54 PM

## 2020-12-26 ENCOUNTER — Other Ambulatory Visit: Payer: Self-pay

## 2020-12-26 ENCOUNTER — Ambulatory Visit (INDEPENDENT_AMBULATORY_CARE_PROVIDER_SITE_OTHER): Payer: Medicare Other | Admitting: Urology

## 2020-12-26 VITALS — BP 132/87 | HR 81

## 2020-12-26 DIAGNOSIS — N200 Calculus of kidney: Secondary | ICD-10-CM

## 2020-12-26 DIAGNOSIS — I2510 Atherosclerotic heart disease of native coronary artery without angina pectoris: Secondary | ICD-10-CM | POA: Diagnosis not present

## 2020-12-26 NOTE — Progress Notes (Signed)
12/26/2020 12:57 PM   [name] Barnett [name] Barnett 05/10/1980 PT:[id number]  Referring provider: Julianne Handler, DO [address] [place],  [place] [address]  Followup nephrolithiasis   HPI: Ms [name] Barnett is a 40yo here for followup for nephrolithiasis. She believes she passed her calculus. She denies any flank pain. No gross hematuria. No significant LUTS. Renal US 8/21 shows no hydronephrosis. No other complaints today   PMH: Past Medical History:  Diagnosis Date   Diabetes mellitus without complication ([place])    Hyperlipidemia    Hypertension    Renal disorder    Stroke [facility])    Vitamin D deficiency     Surgical History: Past Surgical History:  Procedure Laterality Date   cardiac stents     CATARACT EXTRACTION W/PHACO Left 08/29/2020   Procedure: CATARACT EXTRACTION PHACO AND INTRAOCULAR LENS PLACEMENT LEFT EYE;  Surgeon: Baruch Goldmann, MD;  Location: [facility];  Service: Ophthalmology;  Laterality: Left;  left CDE=29.30   CATARACT EXTRACTION W/PHACO Right 11/28/2020   Procedure: CATARACT EXTRACTION PHACO AND INTRAOCULAR LENS PLACEMENT ([facility]);  Surgeon: Baruch Goldmann, MD;  Location: [facility];  Service: Ophthalmology;  Laterality: Right;  CDE 4.35   COLOSTOMY     TUBAL LIGATION     wound on buttocks      Home Medications:  Allergies as of 12/26/2020       Reactions   Corn-containing Products    Per MAR   Lisinopril    Per MAR   Morphine And Related    Per [facility]        Medication List        Accurate as of December 26, 2020 12:57 PM. If you have any questions, ask your nurse or doctor.          STOP taking these medications    cefUROXime 500 MG tablet Commonly known as: CEFTIN Stopped by: Nicolette Bang, MD   predniSONE 5 MG tablet Commonly known as: DELTASONE Stopped by: Nicolette Bang, MD       TAKE these medications    acetaminophen 325 MG tablet Commonly known as: TYLENOL Take 650 mg by mouth every 6 (six) hours as needed.   amLODipine 5  MG tablet Commonly known as: NORVASC Take 5 mg by mouth See admin instructions. Sun, Mon, Wed and Fri   ascorbic acid 500 MG tablet Commonly known as: VITAMIN C Take 500 mg by mouth 2 (two) times daily.   aspirin EC 81 MG tablet Take 81 mg by mouth daily. Swallow whole.   atenolol 25 MG tablet Commonly known as: TENORMIN Take 0.5 tablets (12.5 mg total) by mouth daily. What changed:  when to take this additional instructions   atorvastatin 80 MG tablet Commonly known as: LIPITOR Take 1 tablet by mouth at bedtime.   b complex-vitamin c-folic acid 0.8 MG Tabs tablet Take 1 tablet by mouth daily.   Biotin 5 MG Tabs Take 5 mg by mouth in the morning and at bedtime.   calcium carbonate 500 MG chewable tablet Commonly known as: TUMS - dosed in mg elemental calcium Chew 1,000 mg by mouth 3 (three) times daily. Before meals   CERTAGEN PO Take 1 tablet by mouth in the morning.   cetaphil lotion Apply 1 application topically 2 (two) times daily as needed for dry skin.   dicyclomine 10 MG capsule Commonly known as: BENTYL Take 10 mg by mouth 3 (three) times daily before meals.   HumaLOG 100 UNIT/ML injection Generic drug: insulin lispro Inject 5  Units into the skin 3 (three) times daily before meals.   Lantus 100 UNIT/ML injection Generic drug: insulin glargine Inject 10 Units into the skin in the morning.   Lidocaine 4 % Ptch Apply 1 application topically daily at 6 (six) AM.   lidocaine-prilocaine cream Commonly known as: EMLA Apply 1 application topically daily as needed. Apply to HD graft site right arm topically one every tues, thurs, sat for pain.   melatonin 3 MG Tabs tablet Take 3 mg by mouth at bedtime.   nepafenac 0.1 % ophthalmic suspension Commonly known as: [place] 1 drop into the right eye daily.   ondansetron 4 MG disintegrating tablet Commonly known as: Zofran ODT Take 1 tablet (4 mg total) by mouth every 8 (eight) hours as needed for  nausea or vomiting.   oxyCODONE-acetaminophen 5-325 MG tablet Commonly known as: Percocet Take 1 tablet by mouth every 4 (four) hours as needed.   pantoprazole 40 MG tablet Commonly known as: PROTONIX Take 40 mg by mouth in the morning.   polyethylene glycol 17 g packet Commonly known as: MIRALAX / GLYCOLAX Take 17 g by mouth daily.   prednisoLONE acetate 1 % ophthalmic suspension Commonly known as: PRED FORTE Place 1 drop into the right eye 2 (two) times daily.   rOPINIRole 2 MG tablet Commonly known as: REQUIP Take 2 mg by mouth at bedtime.   sennosides-docusate sodium 8.6-50 MG tablet Commonly known as: SENOKOT-S Take 2 tablets by mouth daily.   tamsulosin 0.4 MG Caps capsule Commonly known as: FLOMAX Take 1 capsule (0.4 mg total) by mouth daily.   thiamine 100 MG tablet Take 100 mg by mouth daily.   tiZANidine 4 MG tablet Commonly known as: ZANAFLEX Take 4 mg by mouth at bedtime.   traMADol 50 MG tablet Commonly known as: ULTRAM Take 100 mg by mouth every 6 (six) hours as needed.   Vitamin D 50 MCG (2000 UT) Caps Take 2,000 Units by mouth daily.        Allergies:  Allergies  Allergen Reactions   Corn-Containing Products     Per MAR   Lisinopril     Per MAR   Morphine And Related     Per MAR    Family History: Family History  Problem Relation Age of Onset   Cancer Paternal Grandmother    Heart disease Father    Hypertension Father    Diabetes Father    Glaucoma Father    Heart disease Mother    Hypertension Mother    Diabetes Mother    Diabetes Sister        borderline   Hypertension Sister     Social History:  reports that she has never smoked. She has never used smokeless tobacco. She reports that she does not drink alcohol and does not use drugs.  ROS: All other review of systems were reviewed and are negative except what is noted above in HPI  Physical Exam: BP 132/87   Pulse 81   Constitutional:  Alert and oriented, No acute  distress. HEENT: [place] AT, moist mucus membranes.  Trachea midline, no masses. Cardiovascular: No clubbing, cyanosis, or edema. Respiratory: Normal respiratory effort, no increased work of breathing. GI: Abdomen is soft, nontender, nondistended, no abdominal masses GU: No CVA tenderness.  Lymph: No cervical or inguinal lymphadenopathy. Skin: No rashes, bruises or suspicious lesions. Neurologic: Grossly intact, no focal deficits, moving all 4 extremities. Psychiatric: Normal mood and affect.  Laboratory Data: Lab Results  Component  Value Date   WBC 14.6 (H) 12/01/2020   HGB 12.2 12/01/2020   HCT 39.0 12/01/2020   MCV 95.4 12/01/2020   PLT 396 12/01/2020    Lab Results  Component Value Date   CREATININE 8.68 (H) 12/01/2020    No results found for: PSA  No results found for: TESTOSTERONE  Lab Results  Component Value Date   HGBA1C 7.1 (H) 01/13/2020    Urinalysis    Component Value Date/Time   COLORURINE AMBER (A) 12/01/2020 2301   APPEARANCEUR Cloudy (A) 12/08/2020 1108   LABSPEC 1.013 12/01/2020 2301   PHURINE 8.0 12/01/2020 2301   GLUCOSEU Negative 12/08/2020 1108   HGBUR MODERATE (A) 12/01/2020 2301   BILIRUBINUR Negative 12/08/2020 1108   KETONESUR NEGATIVE 12/01/2020 2301   PROTEINUR 3+ (A) 12/08/2020 1108   PROTEINUR >=300 (A) 12/01/2020 2301   NITRITE Negative 12/08/2020 1108   NITRITE NEGATIVE 12/01/2020 2301   LEUKOCYTESUR 3+ (A) 12/08/2020 1108   LEUKOCYTESUR MODERATE (A) 12/01/2020 2301    Lab Results  Component Value Date   LABMICR See below: 12/08/2020   WBCUA >30 (A) 12/08/2020   LABEPIT >10 (A) 12/08/2020   MUCUS Present 12/08/2020   BACTERIA Many (A) 12/08/2020    Pertinent Imaging: Renal US 12/21/2020: Images reviewed and discussed with the patient  No results found for this or any previous visit.  Results for orders placed during the hospital encounter of 01/13/20  US Venous Img Lower Bilateral (DVT)  Narrative CLINICAL DATA:   Bilateral lower extremity edema  EXAM: BILATERAL LOWER EXTREMITY VENOUS DUPLEX ULTRASOUND  TECHNIQUE: Gray-scale sonography with graded compression, as well as color Doppler and duplex ultrasound were performed to evaluate the lower extremity deep venous systems from the level of the common femoral vein and including the common femoral, femoral, profunda femoral, popliteal and calf veins including the posterior tibial, peroneal and gastrocnemius veins when visible. The superficial great saphenous vein was also interrogated. Spectral Doppler was utilized to evaluate flow at rest and with distal augmentation maneuvers in the common femoral, femoral and popliteal veins.  COMPARISON:  None.  FINDINGS: RIGHT LOWER EXTREMITY  Common Femoral Vein: No evidence of thrombus. Normal compressibility, respiratory phasicity and response to augmentation.  Saphenofemoral Junction: No evidence of thrombus. Normal compressibility and flow on color Doppler imaging.  Profunda Femoral Vein: No evidence of thrombus. Normal compressibility and flow on color Doppler imaging.  Femoral Vein: No evidence of thrombus. Normal compressibility, respiratory phasicity and response to augmentation.  Popliteal Vein: No evidence of thrombus. Normal compressibility, respiratory phasicity and response to augmentation.  Calf Veins: No evidence of thrombus. Normal compressibility and flow on color Doppler imaging.  Superficial Great Saphenous Vein: No evidence of thrombus. Normal compressibility.  Venous Reflux:  None.  Other Findings:  None.  LEFT LOWER EXTREMITY  Common Femoral Vein: No evidence of thrombus. Normal compressibility, respiratory phasicity and response to augmentation.  Saphenofemoral Junction: No evidence of thrombus. Normal compressibility and flow on color Doppler imaging.  Profunda Femoral Vein: No evidence of thrombus. Normal compressibility and flow on color Doppler  imaging.  Femoral Vein: No evidence of thrombus. Normal compressibility, respiratory phasicity and response to augmentation.  Popliteal Vein: No evidence of thrombus. Normal compressibility, respiratory phasicity and response to augmentation.  Calf Veins: No evidence of thrombus. Normal compressibility and flow on color Doppler imaging.  Superficial Great Saphenous Vein: No evidence of thrombus. Normal compressibility.  Venous Reflux:  None.  Other Findings:  None.  IMPRESSION: No evidence of  deep venous thrombosis in either lower extremity.   Electronically Signed By: Lowella Grip III M.D. On: 01/13/2020 10:24  No results found for this or any previous visit.  No results found for this or any previous visit.  Results for orders placed during the hospital encounter of 12/19/20  Ultrasound renal complete  Narrative CLINICAL DATA:  Nephrolithiasis  EXAM: RENAL / URINARY TRACT ULTRASOUND COMPLETE  COMPARISON:  CT abdomen pelvis 12/01/2020  FINDINGS: Right Kidney:  Renal measurements: 9.4 x 3.5 x 5.0 cm = volume: 85 mL. Echogenicity within normal limits. No mass or hydronephrosis visualized.  Left Kidney:  Renal measurements: 8.3 x 4.2 x 4.9 cm = volume: 90 mL. Echogenicity within normal limits. No mass or hydronephrosis visualized.  Bladder:  Evaluation limited by under distension.  Other:  None.  IMPRESSION: No significant sonographic abnormality of the kidneys.   Electronically Signed By: Miachel Roux M.D. On: 12/21/2020 08:25  No results found for this or any previous visit.  No results found for this or any previous visit.  No results found for this or any previous visit.   Assessment & Plan:    1. Nephrolithiasis -RTC 6 months   No follow-ups on file.  Nicolette Bang, MD  [facility] [place]

## 2020-12-26 NOTE — Progress Notes (Signed)

## 2020-12-31 ENCOUNTER — Encounter (HOSPITAL_BASED_OUTPATIENT_CLINIC_OR_DEPARTMENT_OTHER): Payer: Medicare Other | Attending: Internal Medicine | Admitting: Internal Medicine

## 2020-12-31 ENCOUNTER — Other Ambulatory Visit: Payer: Self-pay

## 2020-12-31 DIAGNOSIS — L89312 Pressure ulcer of right buttock, stage 2: Secondary | ICD-10-CM | POA: Insufficient documentation

## 2020-12-31 DIAGNOSIS — E1122 Type 2 diabetes mellitus with diabetic chronic kidney disease: Secondary | ICD-10-CM | POA: Diagnosis not present

## 2020-12-31 DIAGNOSIS — Z888 Allergy status to other drugs, medicaments and biological substances status: Secondary | ICD-10-CM | POA: Diagnosis not present

## 2020-12-31 DIAGNOSIS — E1151 Type 2 diabetes mellitus with diabetic peripheral angiopathy without gangrene: Secondary | ICD-10-CM | POA: Insufficient documentation

## 2020-12-31 DIAGNOSIS — Z8349 Family history of other endocrine, nutritional and metabolic diseases: Secondary | ICD-10-CM | POA: Insufficient documentation

## 2020-12-31 DIAGNOSIS — Z833 Family history of diabetes mellitus: Secondary | ICD-10-CM | POA: Diagnosis not present

## 2020-12-31 DIAGNOSIS — Z8249 Family history of ischemic heart disease and other diseases of the circulatory system: Secondary | ICD-10-CM | POA: Insufficient documentation

## 2020-12-31 DIAGNOSIS — Z885 Allergy status to narcotic agent status: Secondary | ICD-10-CM | POA: Diagnosis not present

## 2020-12-31 DIAGNOSIS — E1140 Type 2 diabetes mellitus with diabetic neuropathy, unspecified: Secondary | ICD-10-CM | POA: Diagnosis not present

## 2020-12-31 DIAGNOSIS — I5032 Chronic diastolic (congestive) heart failure: Secondary | ICD-10-CM | POA: Diagnosis not present

## 2020-12-31 DIAGNOSIS — I132 Hypertensive heart and chronic kidney disease with heart failure and with stage 5 chronic kidney disease, or end stage renal disease: Secondary | ICD-10-CM | POA: Insufficient documentation

## 2020-12-31 DIAGNOSIS — Z992 Dependence on renal dialysis: Secondary | ICD-10-CM | POA: Insufficient documentation

## 2020-12-31 DIAGNOSIS — N186 End stage renal disease: Secondary | ICD-10-CM | POA: Diagnosis not present

## 2020-12-31 DIAGNOSIS — E11622 Type 2 diabetes mellitus with other skin ulcer: Secondary | ICD-10-CM | POA: Insufficient documentation
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
## 2021-01-01 NOTE — Progress Notes (Signed)
[name] Barnett, PARELLA ([id number]) Visit Report for 12/31/2020 Chief Complaint Document Details Patient Name: Date of Service: [name] Barnett, RUDD 12/31/2020 1:15 PM Medical Record Number: [id number] Patient Account Number: [id number] Date of Birth/Sex: Treating RN: 1980-12-09 (40 y.o. [name] Barnett Primary Care Provider: SITA FA LWA LLA, SHA RMIN Other Clinician: Referring Provider: Treating Provider/Extender: Linton Ham SITA FA LWA LLA, SHA RMIN Weeks in Treatment: 0 Information Obtained from: Patient Chief Complaint 06/15/2019; patient is here for review of the wound on her lower sacrum 12/31/2020; patient is here for review of the wound on her right buttock Electronic Signature(s) Signed: 12/31/2020 4:28:25 PM By: Linton Ham MD Entered By: Linton Ham on 12/31/2020 14:00:40 -------------------------------------------------------------------------------- Debridement Details Patient Name: Date of Service: [name] Barnett, DUCKSWORTH 12/31/2020 1:15 PM Medical Record Number: [id number] Patient Account Number: [id number] Date of Birth/Sex: Treating RN: 06-03-1980 (40 y.o. [name] Barnett Primary Care Provider: SITA FA LWA LLA, SHA RMIN Other Clinician: Referring Provider: Treating Provider/Extender: Linton Ham SITA FA LWA LLA, SHA RMIN Weeks in Treatment: 0 Debridement Performed for Assessment: Wound #5 Right Gluteus Performed By: Physician Ricard Dillon., MD Debridement Type: Debridement Level of Consciousness (Pre-procedure): Awake and Alert Pre-procedure Verification/Time Out Yes - 13:47 Taken: Start Time: 13:47 Pain Control: Lidocaine T Area Debrided (L x W): otal 1.8 (cm) x 0.7 (cm) = 1.26 (cm) Tissue and other material debrided: Viable, Non-Viable, Slough, Subcutaneous, Skin: Dermis , Skin: Epidermis, Slough Level: Skin/Subcutaneous Tissue Debridement Description: Excisional Instrument: Curette Bleeding: Minimum Hemostasis Achieved:  Pressure End Time: 13:47 Procedural Pain: 0 Post Procedural Pain: 0 Response to Treatment: Procedure was tolerated well Level of Consciousness (Post- Awake and Alert procedure): Post Debridement Measurements of Total Wound Length: (cm) 1.8 Stage: Category/Stage II Width: (cm) 0.7 Depth: (cm) 0.3 Volume: (cm) 0.297 Character of Wound/Ulcer Post Debridement: Improved Post Procedure Diagnosis Same as Pre-procedure Electronic Signature(s) Signed: 12/31/2020 4:28:25 PM By: Linton Ham MD Signed: 01/01/2021 4:23:50 PM By: Rhae Hammock RN Entered By: Rhae Hammock on 12/31/2020 14:57:56 -------------------------------------------------------------------------------- HPI Details Patient Name: Date of Service: [name] Barnett, DEWINDT 12/31/2020 1:15 PM Medical Record Number: [id number] Patient Account Number: [id number] Date of Birth/Sex: Treating RN: 1980-08-06 (40 y.o. [name] Barnett Primary Care Provider: SITA FA LWA LLA, SHA RMIN Other Clinician: Referring Provider: Treating Provider/Extender: Linton Ham SITA FA LWA LLA, SHA RMIN Weeks in Treatment: 0 History of Present Illness HPI Description: Admission 06/15/2019 This is a 40 year old woman who comes to [place] from [facility] skilled facility in [place]. I am able to follow a lot of her history on care everywhere in epic. The patient is a type II diabetic and has been on dialysis. 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 I am not exactly sure of the nature of the stroke at this present time. She has made a decent recovery and is now up walking with a walker. In August 2020 she was admitted to [facility] in [place] with L4-L5 discitis. Culture and sensitivity showed Klebsiella. She was discharged on 8 weeks of ceftazidime. In mid September she is readmitted to the hospital with a pressure ulcer on her lower sacrum and  necrotizing fasciitis. She required an extensive surgical debridement. CT scan of the pelvis at 1 point showed communication with the rectum. A colostomy was recommended and placed. Culture of this area showed Klebsiella and yeast. She was discharged on an extended course of meropenem and Diflucan. [facility] [place] We do not have any of these records. I think she was discharged to another nursing home but is come to [facility] skilled facility in [facility]. She was sent down here for review of the remaining wound. By review of the pictures that the patient had from September this is made considerable improvement. She tells me that the wound VAC was discontinued at the end of December 2020. Currently the facility is using a form of silver alginate to the wound surface. Past medical history type 2 diabetes with chronic renal failure on dialysis Tuesday Thursday and Saturday. CVA with left hemiparesis in June 2020 but she appears to be making a good recovery. Chronic diastolic heart failure, hypertension 2/19; patient's wound area slightly smaller. The area that was towards her anus has closed over. We are using Santyl covered with Hydrofera Blue. We have not been able to get her lab work apparently her veins are very difficult for phlebotomy. They are trying to get the blood work done where she dialyzes in [facility] but that requires some logistical issues. 2/26; not much change in the wound. Still a nonviable surface we have been using Santyl and Hydrofera Blue. I changed her to [facility] only. She tells me she has rigorously offloading this area she also complains of some pain 3/5; still no change here. I switch to pure Hydrofera Blue. She still complains of a lot of pain although is a big wound it seems out of proportion to what I might expect. This was initially a surgical wound secondary to necrotizing wound infection. She  is on dialysis. Other than that she thinks she is offloading this fairly rigorously. She has not been systemically unwell 3/26; I switched to Iodoflex 3 weeks ago. This really seems to have helped, much better surface and slight improvement in the surface area. 4/26; the patient is still on Iodoflex she missed an appointment 2 weeks ago. Doing slightly smaller. She is on dialysis 5/10; using Hydrofera Blue. The major improvement here is the complete lack of depth. She has islands of epithelialization 09/24/19-Patient comes in at 2 weeks, dimensions about the same, there is an [place] of epithelium at margin of wound, no slough noted 10/15/2019 upon evaluation today patient appears to be doing well with regard to her wound. She has been tolerating the dressing changes in the sacral region without complication and at this point she seems to be healing quite nicely. I am extremely pleased with how things are progressing and the epithelial tissue seems to be spreading from the central portion as well which is also excellent. 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 We have been using Hydrofera Blue. The smaller area which has depth at 0.7 cm there is a question about whether they have been packing anything into this or just flaring Hydrofera Blue over the top 9/23; its been more than 2 months since this patient was here. She still has a small area distally which is the remanent of her stage IV wound. They have been using Hydrofera Blue. She also has an area on the left gluteal 10/22; 1 month follow-up. I think the original sacral wound is closed however the patient has 2 areas distally 1 over the coccyx a small pinpoint area and the other into the gluteal cleft. Neither 1 of these looks particularly infected. I thought they might be connected but they  were not. 11/15; the original sacral wound is closed patient only has 1 remaining area. 1 of  these is in the gluteal cleft just below the coccyx. The area over the coccyx from last time I think is all so closed 12/31/2020; this is a patient that we saw for a pressure ulcer over her lower sacrum and coccyx for a long period of time in 2021. She eventually healed over. She is at [facility] skilled facility largely secondary to a remote CVA she had. She is also end-stage renal disease on dialysis secondary to type 2 diabetes. She tells [place] that since about mid June she has had an area more distally than the original sacrum/coccyx wound it looks as though they are putting silver alginate in this area. She is limited in her ability to offload this I think spending most of her time in a wheelchair although she can stand to transfer. She has an ostomy During her original stay here in 2021 I also list that she had a more distal area into her perineum that closed over well before the more proximal coccyx/sacral wound. She had a CT scan of the abdomen and pelvis on 12/01/2020. This did note first chronic sacral decubitus along the right aspect of her distal sacrum and coccyx. There was not felt to be bone destruction she has chronic changes of bilateral sacroiliitis and chronic endplate destruction and sclerosis at L4-L5 secondary to previous osteomyelitis however no new findings were felt to be seen in terms of bone problems in this area Electronic Signature(s) Signed: 12/31/2020 4:28:25 PM By: Linton Ham MD Entered By: Linton Ham on 12/31/2020 14:11:47 -------------------------------------------------------------------------------- Physical Exam Details Patient Name: Date of Service: [name] Barnett, GALPERIN 12/31/2020 1:15 PM Medical Record Number: [id number] Patient Account Number: [id number] Date of Birth/Sex: Treating RN: Sep 06, 1980 (40 y.o. [name] Barnett Primary Care Provider: SITA FA LWA LLA, SHA RMIN Other Clinician: Referring Provider: Treating Provider/Extender: Linton Ham SITA FA LWA LLA, SHA RMIN Weeks in Treatment: 0 Constitutional Sitting or standing Blood Pressure is within target range for patient.. Pulse regular and within target range for patient.Marland Kitchen Respirations regular, non-labored and within target range.. Temperature is normal and within the target range for the patient.Marland Kitchen Appears in no distress. Respiratory work of breathing is normal. Notes Wound exam; the area that was I think her original wound on the sacrum and distal coccyx has some dry skin over the surface but nothing open here. The wound is actually more distal slightly to the right of midline small circular area. I used a #3 curette to remove the very fibrinous gritty surface. Hemostasis with direct pressure. There is no evidence of surrounding infection. No other cutaneous problems were seen in this area Electronic Signature(s) Signed: 12/31/2020 4:28:25 PM By: Linton Ham MD Entered By: Linton Ham on 12/31/2020 14:17:21 -------------------------------------------------------------------------------- Physician Orders Details Patient Name: Date of Service: [name] DA BRITLEY, CHIO 12/31/2020 1:15 PM Medical Record Number: [id number] Patient Account Number: [id number] Date of Birth/Sex: Treating RN: 1980-09-26 (40 y.o. [name] Barnett Primary Care Provider: SITA FA LWA LLA, SHA RMIN Other Clinician: Referring Provider: Treating Provider/Extender: Linton Ham SITA FA LWA LLA, SHA RMIN Weeks in Treatment: 0 Verbal / Phone Orders: No Diagnosis Coding ICD-10 Coding Code Description L89.312 Pressure ulcer of right buttock, stage 2 Follow-up Appointments ppointment in 2 weeks. - Dr. Dellia Nims!!:):):) Return A Bathing/ Shower/ Hygiene May shower with protection but do not get wound dressing(s) wet. Off-Loading Turn and reposition every 2 hours Wound Treatment  Wound #5 - Gluteus Wound Laterality: Right Cleanser: Soap and Water Every Other Day/15 Days Discharge  Instructions: May shower and wash wound with dial antibacterial soap and water prior to dressing change. Cleanser: Wound Cleanser Every Other Day/15 Days Discharge Instructions: Cleanse the wound with wound cleanser prior to applying a clean dressing using gauze sponges, not tissue or cotton balls. Peri-Wound Care: Skin Prep Every Other Day/15 Days Discharge Instructions: Use skin prep as directed Prim Dressing: Promogran Prisma Matrix, 4.34 (sq in) (silver collagen) Every Other Day/15 Days ary Discharge Instructions: Moisten collagen with Hydrogel. MAY USE KY JELLY IF NO HYDROGEL Secondary Dressing: Zetuvit Plus Silicone Border Dressing 4x4 (in/in) Every Other Day/15 Days Discharge Instructions: Apply silicone border over primary dressing as directed. Electronic Signature(s) Signed: 12/31/2020 4:28:25 PM By: Linton Ham MD Signed: 01/01/2021 4:23:50 PM By: Rhae Hammock RN Entered By: Rhae Hammock on 12/31/2020 14:03:12 -------------------------------------------------------------------------------- Problem List Details Patient Name: Date of Service: [name] DA [place], LIZAK 12/31/2020 1:15 PM Medical Record Number: [id number] Patient Account Number: [id number] Date of Birth/Sex: Treating RN: June 13, 1980 (40 y.o. [name] Barnett Primary Care Provider: SITA FA LWA LLA, SHA RMIN Other Clinician: Referring Provider: Treating Provider/Extender: Linton Ham SITA FA LWA LLA, SHA RMIN Weeks in Treatment: 0 Active Problems ICD-10 Encounter Code Description Active Date MDM Diagnosis L89.312 Pressure ulcer of right buttock, stage 2 12/31/2020 No Yes Inactive Problems Resolved Problems Electronic Signature(s) Signed: 12/31/2020 4:28:25 PM By: Linton Ham MD Entered By: Linton Ham on 12/31/2020 13:58:07 -------------------------------------------------------------------------------- Progress Note Details Patient Name: Date of Service: [name] Barnett, MEASEL  12/31/2020 1:15 PM Medical Record Number: [id number] Patient Account Number: [id number] Date of Birth/Sex: Treating RN: [birth year] (40 y.o. [name] Barnett Primary Care Provider: SITA FA LWA LLA, SHA RMIN Other Clinician: Referring Provider: Treating Provider/Extender: Linton Ham SITA FA LWA LLA, SHA RMIN Weeks in Treatment: 0 Subjective Chief Complaint Information obtained from Patient 06/15/2019; patient is here for review of the wound on her lower sacrum 12/31/2020; patient is here for review of the wound on her right buttock History of Present Illness (HPI) Admission 06/15/2019 This is a 40 year old woman who comes to [place] from [facility] skilled facility in [place]. I am able to follow a lot of her history on care everywhere in epic. The patient is a type II diabetic and has been on dialysis. 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 I am not exactly sure of the nature of the stroke at this present time. She has made a decent recovery and is now up walking with a walker. In August 2020 she was admitted to [facility] in [place] with L4-L5 discitis. Culture and sensitivity showed Klebsiella. She was discharged on 8 weeks of ceftazidime. In mid September she is readmitted to the hospital with a pressure ulcer on her lower sacrum and necrotizing fasciitis. She required an extensive surgical debridement. CT scan of the pelvis at 1 point showed communication with the rectum. A colostomy was recommended and placed. Culture of this area showed Klebsiella and yeast. She was discharged on an extended course of meropenem and Diflucan. [facility] [place] We do not have any of these records. I think she was discharged to another nursing home but is come to [facility] skilled facility in [facility]. She was sent down here for review of the remaining  wound. By review of the pictures that the  patient had from September this is made considerable improvement. She tells me that the wound VAC was discontinued at the end of December 2020. Currently the facility is using a form of silver alginate to the wound surface. Past medical history type 2 diabetes with chronic renal failure on dialysis Tuesday Thursday and Saturday. CVA with left hemiparesis in June 2020 but she appears to be making a good recovery. Chronic diastolic heart failure, hypertension 2/19; patient's wound area slightly smaller. The area that was towards her anus has closed over. We are using Santyl covered with Hydrofera Blue. We have not been able to get her lab work apparently her veins are very difficult for phlebotomy. They are trying to get the blood work done where she dialyzes in [facility] but that requires some logistical issues. 2/26; not much change in the wound. Still a nonviable surface we have been using Santyl and Hydrofera Blue. I changed her to [facility] only. She tells me she has rigorously offloading this area she also complains of some pain 3/5; still no change here. I switch to pure Hydrofera Blue. She still complains of a lot of pain although is a big wound it seems out of proportion to what I might expect. This was initially a surgical wound secondary to necrotizing wound infection. She is on dialysis. Other than that she thinks she is offloading this fairly rigorously. She has not been systemically unwell 3/26; I switched to Iodoflex 3 weeks ago. This really seems to have helped, much better surface and slight improvement in the surface area. 4/26; the patient is still on Iodoflex she missed an appointment 2 weeks ago. Doing slightly smaller. She is on dialysis 5/10; using Hydrofera Blue. The major improvement here is the complete lack of depth. She has islands of epithelialization 09/24/19-Patient comes in at 2 weeks, dimensions about the same,  there is an [place] of epithelium at margin of wound, no slough noted 10/15/2019 upon evaluation today patient appears to be doing well with regard to her wound. She has been tolerating the dressing changes in the sacral region without complication and at this point she seems to be healing quite nicely. I am extremely pleased with how things are progressing and the epithelial tissue seems to be spreading from the central portion as well which is also excellent. 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 We have been using Hydrofera Blue. The smaller area which has depth at 0.7 cm there is a question about whether they have been packing anything into this or just flaring Hydrofera Blue over the top 9/23; its been more than 2 months since this patient was here. She still has a small area distally which is the remanent of her stage IV wound. They have been using Hydrofera Blue. She also has an area on the left gluteal 10/22; 1 month follow-up. I think the original sacral wound is closed however the patient has 2 areas distally 1 over the coccyx a small pinpoint area and the other into the gluteal cleft. Neither 1 of these looks particularly infected. I thought they might be connected but they were not. 11/15; the original sacral wound is closed patient only has 1 remaining area. 1 of these is in the gluteal cleft just below the coccyx. The area over the coccyx from last time I think is all so closed 12/31/2020; this is a patient that we saw for a pressure ulcer over  her lower sacrum and coccyx for a long period of time in 2021. She eventually healed over. She is at [facility] skilled facility largely secondary to a remote CVA she had. She is also end-stage renal disease on dialysis secondary to type 2 diabetes. She tells [place] that since about mid June she has had an area more distally than the original sacrum/coccyx wound it looks as though they  are putting silver alginate in this area. She is limited in her ability to offload this I think spending most of her time in a wheelchair although she can stand to transfer. She has an ostomy During her original stay here in 2021 I also list that she had a more distal area into her perineum that closed over well before the more proximal coccyx/sacral wound. She had a CT scan of the abdomen and pelvis on 12/01/2020. This did note first chronic sacral decubitus along the right aspect of her distal sacrum and coccyx. There was not felt to be bone destruction she has chronic changes of bilateral sacroiliitis and chronic endplate destruction and sclerosis at L4-L5 secondary to previous osteomyelitis however no new findings were felt to be seen in terms of bone problems in this area Patient History Information obtained from Patient, Chart. Allergies lisinopril (Severity: Moderate, Reaction: hair falls out, lip swelling), morphine (Reaction: hallucinations), corn (Reaction: throat swelling) Family History Diabetes - Maternal Grandparents, Heart Disease - Mother,Father, Hypertension - Maternal Grandparents, Kidney Disease - Maternal Grandparents, Lung Disease - Mother, Stroke - Mother,Father, Thyroid Problems - Mother,Siblings, No family history of Cancer, Hereditary Spherocytosis, Seizures, Tuberculosis. Social History Never smoker, Marital Status - Separated, Alcohol Use - Never, Drug Use - No History, Caffeine Use - Daily - coffee. Medical History Eyes Patient has history of Cataracts, Glaucoma Ear/Nose/Mouth/Throat Denies history of Chronic sinus problems/congestion, Middle ear problems Hematologic/Lymphatic Patient has history of Anemia Cardiovascular Patient has history of Congestive Heart Failure, Hypertension Denies history of Angina, Arrhythmia, Coronary Artery Disease, Deep Vein Thrombosis, Hypotension, Myocardial Infarction, Peripheral Arterial Disease, Peripheral Venous Disease,  Phlebitis, Vasculitis Endocrine Patient has history of Type II Diabetes Genitourinary Patient has history of End Stage Renal Disease - HD Integumentary (Skin) Denies history of History of Burn Musculoskeletal Denies history of Gout, Rheumatoid Arthritis, Osteoarthritis, Osteomyelitis Neurologic Patient has history of Neuropathy Denies history of Dementia, Quadriplegia, Paraplegia, Seizure Disorder Oncologic Denies history of Received Chemotherapy, Received Radiation Psychiatric Patient has history of Confinement Anxiety Denies history of Anorexia/bulimia Hospitalization/Surgery History - right arm AV fistula. - cardiac cath with stents. - sacral wound debridement. - left knee surgery. - catract extraction. - colostomy. Medical A Surgical History Notes nd Cardiovascular hyperlipidemia Gastrointestinal GERD, diverting colostomy Musculoskeletal left side weakness Neurologic CVA left mild hemiplegia, lumbar discitis Psychiatric depression, anxiety Review of Systems (ROS) Constitutional Symptoms (General Health) Denies complaints or symptoms of Fatigue, Fever, Chills, Marked Weight Change. Eyes Denies complaints or symptoms of Dry Eyes, Vision Changes, Glasses / Contacts. Ear/Nose/Mouth/Throat Denies complaints or symptoms of Chronic sinus problems or rhinitis. Respiratory Denies complaints or symptoms of Chronic or frequent coughs, Shortness of Breath. Cardiovascular Denies complaints or symptoms of Chest pain. Gastrointestinal Denies complaints or symptoms of Frequent diarrhea, Nausea, Vomiting. Endocrine Denies complaints or symptoms of Heat/cold intolerance. Integumentary (Skin) Complains or has symptoms of Wounds. Musculoskeletal Denies complaints or symptoms of Muscle Pain, Muscle Weakness. Neurologic Denies complaints or symptoms of Numbness/parasthesias. Psychiatric Denies complaints or symptoms of Claustrophobia, Suicidal. Objective Constitutional Sitting  or standing Blood Pressure is within target range  for patient.. Pulse regular and within target range for patient.Marland Kitchen Respirations regular, non-labored and within target range.. Temperature is normal and within the target range for the patient.Marland Kitchen Appears in no distress. Vitals Time Taken: 1:19 PM, Temperature: 98.3 F, Pulse: 78 bpm, Respiratory Rate: 18 breaths/min, Blood Pressure: 131/86 mmHg, Capillary Blood Glucose: 163 mg/dl. Respiratory work of breathing is normal. General Notes: Wound exam; the area that was I think her original wound on the sacrum and distal coccyx has some dry skin over the surface but nothing open here. The wound is actually more distal slightly to the right of midline small circular area. I used a #3 curette to remove the very fibrinous gritty surface. Hemostasis with direct pressure Integumentary (Hair, Skin) Wound #5 status is Open. Original cause of wound was Pressure Injury. The date acquired was: 10/22/2020. The wound is located on the Right Gluteus. The wound measures 1.8cm length x 0.7cm width x 0.3cm depth; 0.99cm^2 area and 0.297cm^3 volume. Assessment Active Problems ICD-10 Pressure ulcer of right buttock, stage 2 Procedures Wound #5 Pre-procedure diagnosis of Wound #5 is a Pressure Ulcer located on the Right Gluteus . There was a Excisional Skin/Subcutaneous Tissue Debridement with a total area of 1.26 sq cm performed by Ricard Dillon., MD. With the following instrument(s): Curette to remove Viable and Non-Viable tissue/material. Material removed includes Subcutaneous Tissue, Slough, Skin: Dermis, and Skin: Epidermis after achieving pain control using Lidocaine. No specimens were taken. A time out was conducted at 13:47, prior to the start of the procedure. A Minimum amount of bleeding was controlled with Pressure. The procedure was tolerated well with a pain level of 0 throughout and a pain level of 0 following the procedure. Post Debridement  Measurements: 1.8cm length x 0.7cm width x 0.3cm depth; 0.297cm^3 volume. Post debridement Stage noted as Category/Stage I. Character of Wound/Ulcer Post Debridement is improved. Post procedure Diagnosis Wound #5: Same as Pre-Procedure Plan Follow-up Appointments: Return Appointment in 2 weeks. - Dr. Dellia Nims!!:):):) Bathing/ Shower/ Hygiene: May shower with protection but do not get wound dressing(s) wet. Off-Loading: Turn and reposition every 2 hours WOUND #5: - Gluteus Wound Laterality: Right Cleanser: Soap and Water Every Other Day/15 Days Discharge Instructions: May shower and wash wound with dial antibacterial soap and water prior to dressing change. Cleanser: Wound Cleanser Every Other Day/15 Days Discharge Instructions: Cleanse the wound with wound cleanser prior to applying a clean dressing using gauze sponges, not tissue or cotton balls. Peri-Wound Care: Skin Prep Every Other Day/15 Days Discharge Instructions: Use skin prep as directed Prim Dressing: Promogran Prisma Matrix, 4.34 (sq in) (silver collagen) Every Other Day/15 Days ary Discharge Instructions: Moisten collagen with Hydrogel. MAY USE KY JELLY IF NO HYDROGEL Secondary Dressing: Zetuvit Plus Silicone Border Dressing 4x4 (in/in) Every Other Day/15 Days Discharge Instructions: Apply silicone border over primary dressing as directed. 1. Otherwise fairly benign looking lesion in the gluteal cleft just proximal to her anal opening. 2. In an ordinary setting this would be an unusual position for a pressure ulcer although I think I noted this last time I will need to review my notes. I see no other obvious explanation here no chronic skin issues. The area did not look wet or macerated in fact it looked dry 3. I went ahead using silver collagen on this to see if we can stimulate some granulation. 4. At this point I did not see any additional testing that would be necessary no cultures or imaging. 5. She sits at dialysis 3  days  a week and I think is in a wheelchair most of the time. This makes offloading this area fairly difficult although we did not managed to get these areas to close last time 6 a CT scan of the abdomen pelvis showed no bony abnormalities underneath this area. This may have been done in follow-up from her previous discitis in the L5-S1 area Electronic Signature(s) Signed: 12/31/2020 4:28:25 PM By: Linton Ham MD Entered By: Linton Ham on 12/31/2020 14:15:48 -------------------------------------------------------------------------------- HxROS Details Patient Name: Date of Service: [name] Barnett, [name] Barnett 12/31/2020 1:15 PM Medical Record Number: [id number] Patient Account Number: [id number] Date of Birth/Sex: Treating RN: May 17, 1980 (40 y.o. [name] Barnett Primary Care Provider: SITA FA LWA LLA, SHA RMIN Other Clinician: Referring Provider: Treating Provider/Extender: Linton Ham SITA FA LWA LLA, SHA RMIN Weeks in Treatment: 0 Information Obtained From Patient Chart Constitutional Symptoms (General Health) Complaints and Symptoms: Negative for: Fatigue; Fever; Chills; Marked Weight Change Eyes Complaints and Symptoms: Negative for: Dry Eyes; Vision Changes; Glasses / Contacts Medical History: Positive for: Cataracts; Glaucoma Ear/Nose/Mouth/Throat Complaints and Symptoms: Negative for: Chronic sinus problems or rhinitis Medical History: Negative for: Chronic sinus problems/congestion; Middle ear problems Respiratory Complaints and Symptoms: Negative for: Chronic or frequent coughs; Shortness of Breath Cardiovascular Complaints and Symptoms: Negative for: Chest pain Medical History: Positive for: Congestive Heart Failure; Hypertension Negative for: Angina; Arrhythmia; Coronary Artery Disease; Deep Vein Thrombosis; Hypotension; Myocardial Infarction; Peripheral Arterial Disease; Peripheral Venous Disease; Phlebitis; Vasculitis Past Medical History  Notes: hyperlipidemia Gastrointestinal Complaints and Symptoms: Negative for: Frequent diarrhea; Nausea; Vomiting Medical History: Past Medical History Notes: GERD, diverting colostomy Endocrine Complaints and Symptoms: Negative for: Heat/cold intolerance Medical History: Positive for: Type II Diabetes Time with diabetes: 29 yrs Treated with: Insulin Blood sugar tested every day: Yes Tested : 3 times per day Integumentary (Skin) Complaints and Symptoms: Positive for: Wounds Medical History: Negative for: History of Burn Musculoskeletal Complaints and Symptoms: Negative for: Muscle Pain; Muscle Weakness Medical History: Negative for: Gout; Rheumatoid Arthritis; Osteoarthritis; Osteomyelitis Past Medical History Notes: left side weakness Neurologic Complaints and Symptoms: Negative for: Numbness/parasthesias Medical History: Positive for: Neuropathy Negative for: Dementia; Quadriplegia; Paraplegia; Seizure Disorder Past Medical History Notes: CVA left mild hemiplegia, lumbar discitis Psychiatric Complaints and Symptoms: Negative for: Claustrophobia; Suicidal Medical History: Positive for: Confinement Anxiety Negative for: Anorexia/bulimia Past Medical History Notes: depression, anxiety Hematologic/Lymphatic Medical History: Positive for: Anemia Genitourinary Medical History: Positive for: End Stage Renal Disease - HD Immunological Oncologic Medical History: Negative for: Received Chemotherapy; Received Radiation HBO Extended History Items Eyes: Eyes: Cataracts Glaucoma Immunizations Pneumococcal Vaccine: Received Pneumococcal Vaccination: Yes Received Pneumococcal Vaccination On or After 60th Birthday: No Implantable Devices Yes Hospitalization / Surgery History Type of Hospitalization/Surgery right arm AV fistula cardiac cath with stents sacral wound debridement left knee surgery catract extraction colostomy Family and Social History Cancer: No;  Diabetes: Yes - Maternal Grandparents; Heart Disease: Yes - Mother,Father; Hereditary Spherocytosis: No; Hypertension: Yes - Maternal Grandparents; Kidney Disease: Yes - Maternal Grandparents; Lung Disease: Yes - Mother; Seizures: No; Stroke: Yes - Mother,Father; Thyroid Problems: Yes - Mother,Siblings; Tuberculosis: No; Never smoker; Marital Status - Separated; Alcohol Use: Never; Drug Use: No History; Caffeine Use: Daily - coffee; Financial Concerns: No; Food, Clothing or Shelter Needs: No; Support System Lacking: No; Transportation Concerns: No Electronic Signature(s) Signed: 12/31/2020 4:28:25 PM By: Linton Ham MD Signed: 01/01/2021 4:23:50 PM By: Rhae Hammock RN Entered By: Rhae Hammock on 12/31/2020 13:23:25 -------------------------------------------------------------------------------- SuperBill Details Patient Name: Date of Service:  [name] DA APHRODITE, [name] Barnett 12/31/2020 Medical Record Number: [id number] Patient Account Number: [id number] Date of Birth/Sex: Treating RN: [birth year] (40 y.o. [name] Barnett Primary Care Provider: SITA FA LWA LLA, SHA RMIN Other Clinician: Referring Provider: Treating Provider/Extender: Linton Ham SITA FA LWA LLA, SHA RMIN Weeks in Treatment: 0 Diagnosis Coding ICD-10 Codes Code Description [id number] Pressure ulcer of right buttock, stage 2 Facility Procedures CPT4 Code: [id number] Description: [id number] - WOUND CARE VISIT-LEV 3 EST PT Modifier: Quantity: 1 CPT4 Code: [id number] Description: [id number] - DEB SUBQ TISSUE 20 SQ CM/< ICD-10 Diagnosis Description L89.312 Pressure ulcer of right buttock, stage 2 Modifier: Quantity: 1 Physician Procedures : CPT4 Code Description Modifier [id number] - WC PHYS LEVEL 3 - EST PT ICD-10 Diagnosis Description L89.312 Pressure ulcer of right buttock, stage 2 Quantity: 1 : [id number] - WC PHYS SUBQ TISS 20 SQ CM ICD-10 Diagnosis Description L89.312 Pressure ulcer of right buttock, stage  2 Quantity: 1 Electronic Signature(s) Signed: 12/31/2020 4:28:25 PM By: Linton Ham MD Signed: 01/01/2021 4:23:50 PM By: Rhae Hammock RN Entered By: Rhae Hammock on 12/31/2020 14:58:22

## 2021-01-01 NOTE — Progress Notes (Signed)
[name] FREIDY, BURDETTE ([id number]) Visit Report for 12/31/2020 Abuse/Suicide Risk Screen Details Patient Name: Date of Service: [name] Barnett, [name] Barnett 12/31/2020 1:15 PM Medical Record Number: [id number] Patient Account Number: [id number] Date of Birth/Sex: Treating RN: [birth year] (40 y.o. [name] Barnett Primary Care Sheyla Zaffino: SITA FA LWA LLA, SHA RMIN Other Clinician: Referring Melayna Robarts: Treating Adrin Julian/Extender: Linton Ham SITA FA LWA LLA, SHA RMIN Weeks in Treatment: 0 Abuse/Suicide Risk Screen Items Answer ABUSE RISK SCREEN: Has anyone close to you tried to hurt or harm you recentlyo No Do you feel uncomfortable with anyone in your familyo No Has anyone forced you do things that you didnt want to doo No Electronic Signature(s) Signed: 01/01/2021 4:23:50 PM By: Rhae Hammock RN Entered By: Rhae Hammock on 12/31/2020 13:31:20 -------------------------------------------------------------------------------- Activities of Daily Living Details Patient Name: Date of Service: [name] Barnett, [name] Barnett 12/31/2020 1:15 PM Medical Record Number: [id number] Patient Account Number: [id number] Date of Birth/Sex: Treating RN: Apr 15, 1981 (40 y.o. [name] Barnett Primary Care [name] Barnett: SITA FA LWA LLA, SHA RMIN Other Clinician: Referring [name] Barnett: Treating [name] Barnett/Extender: Linton Ham SITA FA LWA LLA, SHA RMIN Weeks in Treatment: 0 Activities of Daily Living Items Answer Activities of Daily Living (Please select one for each item) Drive Automobile Need Assistance T Medications ake Need Assistance Use T elephone Completely Able Care for Appearance Need Assistance Use T oilet Need Assistance Bath / Shower Need Assistance Dress Self Need Assistance Feed Self Completely Able Walk Need Assistance Get In / Out Bed Need Assistance Housework Need Assistance Prepare Meals Need Assistance Handle Money Need Assistance Shop for Self Need Assistance Electronic  Signature(s) Signed: 01/01/2021 4:23:50 PM By: Rhae Hammock RN Entered By: Rhae Hammock on 12/31/2020 13:31:49 -------------------------------------------------------------------------------- Education Screening Details Patient Name: Date of Service: [name] Barnett, [name] Barnett 12/31/2020 1:15 PM Medical Record Number: [id number] Patient Account Number: [id number] Date of Birth/Sex: Treating RN: 12-Oct-1980 (40 y.o. [name] Barnett Primary Care [name] Barnett: SITA FA LWA LLA, SHA RMIN Other Clinician: Referring [name]: Treating [name] Barnett/Extender: Linton Ham SITA FA LWA LLA, SHA RMIN Weeks in Treatment: 0 Primary Learner Assessed: Patient Learning Preferences/Education Level/Primary Language Learning Preference: Explanation, Demonstration, Communication Board Highest Education Level: High School Preferred Language: English Cognitive Barrier Language Barrier: No Translator Needed: No Memory Deficit: No Emotional Barrier: No Cultural/Religious Beliefs Affecting Medical Care: No Physical Barrier Impaired Vision: No Impaired Hearing: No Decreased Hand dexterity: No Knowledge/Comprehension Knowledge Level: High Comprehension Level: High Ability to understand written instructions: High Ability to understand verbal instructions: High Motivation Anxiety Level: Calm Cooperation: Cooperative Education Importance: Denies Need Interest in Health Problems: Asks Questions Perception: Coherent Willingness to Engage in Self-Management High Activities: Readiness to Engage in Self-Management High Activities: Electronic Signature(s) Signed: 01/01/2021 4:23:50 PM By: Rhae Hammock RN Entered By: Rhae Hammock on 12/31/2020 13:32:18 -------------------------------------------------------------------------------- Fall Risk Assessment Details Patient Name: Date of Service: [name] Barnett, [name] Barnett 12/31/2020 1:15 PM Medical Record Number: [id number] Patient Account Number:  [id number] Date of Birth/Sex: Treating RN: February 20, 1981 (40 y.o. [name] Barnett, [name] Primary Care [name] Barnett: SITA FA LWA LLA, SHA RMIN Other Clinician: Referring [name] Barnett: Treating [name] Barnett/Extender: Linton Ham SITA FA LWA LLA, SHA RMIN Weeks in Treatment: 0 Fall Risk Assessment Items Have you had 2 or more falls in the last 12 monthso 0 No Have you had any fall that resulted in injury in the last 12 monthso 0 No FALLS RISK SCREEN History of falling - immediate or within 3 months 0 No Secondary diagnosis (Do you have 2 or more medical diagnoseso) 0 No  Ambulatory aid None/bed rest/wheelchair/nurse 0 No Crutches/cane/walker 0 No Furniture 0 No Intravenous therapy Access/Saline/Heparin Lock 0 No Gait/Transferring Normal/ bed rest/ wheelchair 0 No Weak (short steps with or without shuffle, stooped but able to lift head while walking, may seek 0 No support from furniture) Impaired (short steps with shuffle, may have difficulty arising from chair, head down, impaired 0 No balance) Mental Status Oriented to own ability 0 No Electronic Signature(s) Signed: 01/01/2021 4:23:50 PM By: Rhae Hammock RN Entered By: Rhae Hammock on 12/31/2020 13:32:29 -------------------------------------------------------------------------------- Foot Assessment Details Patient Name: Date of Service: [name] DA PERRIN, PETROWSKI 12/31/2020 1:15 PM Medical Record Number: [id number] Patient Account Number: [id number] Date of Birth/Sex: Treating RN: 1981-02-14 (40 y.o. [name] Barnett, [name] Primary Care [name]: SITA FA LWA LLA, SHA RMIN Other Clinician: Referring [name] Barnett: Treating [name] Barnett/Extender: Linton Ham SITA FA LWA LLA, SHA RMIN Weeks in Treatment: 0 Foot Assessment Items Site Locations + = Sensation present, - = Sensation absent, C = Callus, U = Ulcer R = Redness, W = Warmth, M = Maceration, PU = Pre-ulcerative lesion F = Fissure, S = Swelling, D = Dryness Assessment Right:  Left: Other Deformity: No No Prior Foot Ulcer: No No Prior Amputation: No No Charcot Joint: No No Ambulatory Status: Gait: Notes N/A no LE wounds Electronic Signature(s) Signed: 01/01/2021 4:23:50 PM By: Rhae Hammock RN Entered By: Rhae Hammock on 12/31/2020 13:32:52 -------------------------------------------------------------------------------- Nutrition Risk Screening Details Patient Name: Date of Service: [name] Barnett, [name] Barnett 12/31/2020 1:15 PM Medical Record Number: [id number] Patient Account Number: [id number] Date of Birth/Sex: Treating RN: 1980-08-27 (40 y.o. [name] Barnett Primary Care Eagle Pitta: SITA FA LWA LLA, SHA RMIN Other Clinician: Referring Shawnese Magner: Treating Harleen Fineberg/Extender: Linton Ham SITA FA LWA LLA, SHA RMIN Weeks in Treatment: 0 Height (in): Weight (lbs): Body Mass Index (BMI): Nutrition Risk Screening Items Score Screening NUTRITION RISK SCREEN: I have an illness or condition that made me change the kind and/or amount of food I eat 0 No I eat fewer than two meals per day 0 No I eat few fruits and vegetables, or milk products 0 No I have three or more drinks of beer, liquor or wine almost every day 0 No I have tooth or mouth problems that make it hard for me to eat 0 No I don't always have enough money to buy the food I need 0 No I eat alone most of the time 0 No I take three or more different prescribed or over-the-counter drugs a day 0 No Without wanting to, I have lost or gained 10 pounds in the last six months 0 No I am not always physically able to shop, cook and/or feed myself 0 No Nutrition Protocols Good Risk Protocol 0 No interventions needed Moderate Risk Protocol High Risk Proctocol Risk Level: Good Risk Score: 0 Electronic Signature(s) Signed: 01/01/2021 4:23:50 PM By: Rhae Hammock RN Entered By: Rhae Hammock on 12/31/2020 13:32:34

## 2021-01-06 ENCOUNTER — Encounter: Payer: Self-pay | Admitting: Urology

## 2021-01-06 NOTE — Patient Instructions (Signed)
Dietary Guidelines to Help Prevent Kidney Stones Kidney stones are deposits of minerals and salts that form inside your kidneys. Your risk of developing kidney stones may be greater depending on your diet, your lifestyle, the medicines you take, and whether you have certain medical conditions. Most people can lower their chances of developing kidney stones by following the instructions below. Your dietitian may give you more specific instructions depending on your overall health and the type of kidney stones you tend to develop. What are tips for following this plan? Reading food labels  Choose foods with "no salt added" or "low-salt" labels. Limit your salt (sodium) intake to less than 1,500 mg a day. Choose foods with calcium for each meal and snack. Try to eat about 300 mg of calcium at each meal. Foods that contain 200-500 mg of calcium a serving include: 8 oz (237 mL) of milk, calcium-fortifiednon-dairy milk, and calcium-fortifiedfruit juice. Calcium-fortified means that calcium has been added to these drinks. 8 oz (237 mL) of kefir, yogurt, and soy yogurt. 4 oz (114 g) of tofu. 1 oz (28 g) of cheese. 1 cup (150 g) of dried figs. 1 cup (91 g) of cooked broccoli. One 3 oz (85 g) can of sardines or mackerel. Most people need 1,000-1,500 mg of calcium a day. Talk to your dietitian about how much calcium is recommended for you. Shopping Buy plenty of fresh fruits and vegetables. Most people do not need to avoid fruits and vegetables, even if these foods contain nutrients that may contribute to kidney stones. When shopping for convenience foods, choose: Whole pieces of fruit. Pre-made salads with dressing on the side. Low-fat fruit and yogurt smoothies. Avoid buying frozen meals or prepared deli foods. These can be high in sodium. Look for foods with live cultures, such as yogurt and kefir. Choose high-fiber grains, such as whole-wheat breads, oat bran, and wheat cereals. Cooking Do not add  salt to food when cooking. Place a salt shaker on the table and allow each person to add his or her own salt to taste. Use vegetable protein, such as beans, textured vegetable protein (TVP), or tofu, instead of meat in pasta, casseroles, and soups. Meal planning Eat less salt, if told by your dietitian. To do this: Avoid eating processed or pre-made food. Avoid eating fast food. Eat less animal protein, including cheese, meat, poultry, or fish, if told by your dietitian. To do this: Limit the number of times you have meat, poultry, fish, or cheese each week. Eat a diet free of meat at least 2 days a week. Eat only one serving each day of meat, poultry, fish, or seafood. When you prepare animal protein, cut pieces into small portion sizes. For most meat and fish, one serving is about the size of the palm of your hand. Eat at least five servings of fresh fruits and vegetables each day. To do this: Keep fruits and vegetables on hand for snacks. Eat one piece of fruit or a handful of berries with breakfast. Have a salad and fruit at lunch. Have two kinds of vegetables at dinner. Limit foods that are high in a substance called oxalate. These include: Spinach (cooked), rhubarb, beets, sweet potatoes, and Swiss chard. Peanuts. Potato chips, french fries, and baked potatoes with skin on. Nuts and nut products. Chocolate. If you regularly take a diuretic medicine, make sure to eat at least 1 or 2 servings of fruits or vegetables that are high in potassium each day. These include: Avocado. Banana. Orange, prune,   carrot, or tomato juice. Baked potato. Cabbage. Beans and split peas. Lifestyle  Drink enough fluid to keep your urine pale yellow. This is the most important thing you can do. Spread your fluid intake throughout the day. If you drink alcohol: Limit how much you use to: 0-1 drink a day for women who are not pregnant. 0-2 drinks a day for men. Be aware of how much alcohol is in your  drink. In the [place] one drink equals one 12 oz bottle of beer (355 mL), one 5 oz glass of wine (148 mL), or one 1 oz glass of hard liquor (44 mL). Lose weight if told by your health care provider. Work with your dietitian to find an eating plan and weight loss strategies that work best for you. General information Talk to your health care provider and dietitian about taking daily supplements. You may be told the following depending on your health and the cause of your kidney stones: Not to take supplements with vitamin C. To take a calcium supplement. To take a daily probiotic supplement. To take other supplements such as magnesium, fish oil, or vitamin B6. Take over-the-counter and prescription medicines only as told by your health care provider. These include supplements. What foods should I limit? Limit your intake of the following foods, or eat them as told by your dietitian. Vegetables Spinach. Rhubarb. Beets. Canned vegetables. Pickles. Olives. Baked potatoes with skin. Grains Wheat bran. Baked goods. Salted crackers. Cereals high in sugar. Meats and other proteins Nuts. Nut butters. Large portions of meat, poultry, or fish. Salted, precooked, or cured meats, such as sausages, meat loaves, and hot dogs. Dairy Cheese. Beverages Regular soft drinks. Regular vegetable juice. Seasonings and condiments Seasoning blends with salt. Salad dressings. Soy sauce. Ketchup. Barbecue sauce. Other foods Canned soups. Canned pasta sauce. Casseroles. Pizza. Lasagna. Frozen meals. Potato chips. French fries. The items listed above may not be a complete list of foods and beverages you should limit. Contact a dietitian for more information. What foods should I avoid? Talk to your dietitian about specific foods you should avoid based on the type of kidney stones you have and your overall health. Fruits Grapefruit. The item listed above may not be a complete list of foods and beverages you should  avoid. Contact a dietitian for more information. Summary Kidney stones are deposits of minerals and salts that form inside your kidneys. You can lower your risk of kidney stones by making changes to your diet. The most important thing you can do is drink enough fluid. Drink enough fluid to keep your urine pale yellow. Talk to your dietitian about how much calcium you should have each day, and eat less salt and animal protein as told by your dietitian. This information is not intended to replace advice given to you by your health care provider. Make sure you discuss any questions you have with your health care provider. Document Revised: 04/12/2019 Document Reviewed: 04/12/2019 [employer] Patient Education  2022 [employer]  

## 2021-01-14 ENCOUNTER — Other Ambulatory Visit: Payer: Self-pay

## 2021-01-14 ENCOUNTER — Encounter (HOSPITAL_BASED_OUTPATIENT_CLINIC_OR_DEPARTMENT_OTHER): Payer: Medicare Other | Attending: Internal Medicine | Admitting: Internal Medicine

## 2021-01-14 DIAGNOSIS — N186 End stage renal disease: Secondary | ICD-10-CM | POA: Diagnosis not present

## 2021-01-14 DIAGNOSIS — E1122 Type 2 diabetes mellitus with diabetic chronic kidney disease: Secondary | ICD-10-CM | POA: Insufficient documentation

## 2021-01-14 DIAGNOSIS — L89312 Pressure ulcer of right buttock, stage 2: Secondary | ICD-10-CM | POA: Insufficient documentation

## 2021-01-14 DIAGNOSIS — E11622 Type 2 diabetes mellitus with other skin ulcer: Secondary | ICD-10-CM | POA: Insufficient documentation

## 2021-01-14 DIAGNOSIS — E1151 Type 2 diabetes mellitus with diabetic peripheral angiopathy without gangrene: Secondary | ICD-10-CM | POA: Diagnosis not present

## 2021-01-14 DIAGNOSIS — Z992 Dependence on renal dialysis: Secondary | ICD-10-CM | POA: Diagnosis not present

## 2021-01-14 DIAGNOSIS — I132 Hypertensive heart and chronic kidney disease with heart failure and with stage 5 chronic kidney disease, or end stage renal disease: Secondary | ICD-10-CM | POA: Insufficient documentation

## 2021-01-14 DIAGNOSIS — I5032 Chronic diastolic (congestive) heart failure: Secondary | ICD-10-CM | POA: Diagnosis not present

## 2021-01-14 NOTE — Progress Notes (Signed)
[name] Barnett, [name] Barnett ([id number]) Visit Report for 01/14/2021 Arrival Information Details Patient Name: Date of Service: [name] Barnett, [name] Barnett 01/14/2021 1:45 PM Medical Record Number: [id number] Patient Account Number: [id number] Date of Birth/Sex: Treating RN: 12-11-1980 (40 y.o. F) [name] Barnett Primary Care [name] Barnett: [name] Barnett Other Clinician: Referring [name] Barnett: Treating [name] Barnett/Extender: [name] Barnett [name] Barnett Weeks in Treatment: 2 Visit Information History Since Last Visit Added or deleted any medications: No Patient Arrived: Wheel Chair Any new allergies or adverse reactions: No Arrival Time: 13:42 Had a fall or experienced change in No Accompanied By: self activities of daily living that may affect Transfer Assistance: None risk of falls: Patient Identification Verified: Yes Signs or symptoms of abuse/neglect since last visito No Secondary Verification Process Completed: Yes Hospitalized since last visit: No Implantable device outside of the clinic excluding No cellular tissue based products placed in the center since last visit: Has Dressing in Place as Prescribed: Yes Pain Present Now: Yes Electronic Signature(s) Signed: 01/14/2021 2:59:51 PM By: [name] Barnett Entered By: [name] Barnett on 01/14/2021 13:46:07 -------------------------------------------------------------------------------- Clinic Level of Care Assessment Details Patient Name: Date of Service: [name] Barnett, [name] Barnett 01/14/2021 1:45 PM Medical Record Number: [id number] Patient Account Number: [id number] Date of Birth/Sex: Treating RN: April 06, 1981 (40 y.o. [name] Primary Care [name] Barnett: [name] Barnett Other Clinician: Referring [name] Barnett: Treating [name] Barnett/Extender: [name] Barnett [name] Barnett Weeks in Treatment: 2 Clinic Level of Care Assessment Items TOOL 4 Quantity Score X- 1 0 Use when only an EandM is performed on  FOLLOW-UP visit ASSESSMENTS - Nursing Assessment / Reassessment X- 1 10 Reassessment of Co-morbidities (includes updates in patient status) X- 1 5 Reassessment of Adherence to Treatment Plan ASSESSMENTS - Wound and Skin A ssessment / Reassessment X - Simple Wound Assessment / Reassessment - one wound 1 5 []  - 0 Complex Wound Assessment / Reassessment - multiple wounds []  - 0 Dermatologic / Skin Assessment (not related to wound area) ASSESSMENTS - Focused Assessment []  - 0 Circumferential Edema Measurements - multi extremities []  - 0 Nutritional Assessment / Counseling / Intervention []  - 0 Lower Extremity Assessment (monofilament, tuning fork, pulses) []  - 0 Peripheral Arterial Disease Assessment (using hand held doppler) ASSESSMENTS - Ostomy and/or Continence Assessment and Care []  - 0 Incontinence Assessment and Management []  - 0 Ostomy Care Assessment and Management (repouching, etc.) PROCESS - Coordination of Care []  - 0 Simple Patient / Family Education for ongoing care X- 1 20 Complex (extensive) Patient / Family Education for ongoing care []  - 0 Staff obtains Programmer, systems, Records, T Results / Process Orders est X- 1 10 Staff telephones HHA, Nursing Homes / Clarify orders / etc []  - 0 Routine Transfer to another Facility (non-emergent condition) []  - 0 Routine Hospital Admission (non-emergent condition) []  - 0 New Admissions / Biomedical engineer / Ordering NPWT Apligraf, etc. , []  - 0 Emergency Hospital Admission (emergent condition) []  - 0 Simple Discharge Coordination []  - 0 Complex (extensive) Discharge Coordination PROCESS - Special Needs []  - 0 Pediatric / Minor Patient Management []  - 0 Isolation Patient Management []  - 0 Hearing / Language / Visual special needs []  - 0 Assessment of Community assistance (transportation, D/C planning, etc.) []  - 0 Additional assistance / Altered mentation []  - 0 Support Surface(s) Assessment (bed, cushion,  seat, etc.) INTERVENTIONS - Wound Cleansing / Measurement X - Simple Wound Cleansing - one wound 1 5 []  - 0 Complex  Wound Cleansing - multiple wounds X- 1 5 Wound Imaging (photographs - any number of wounds) []  - 0 Wound Tracing (instead of photographs) X- 1 5 Simple Wound Measurement - one wound []  - 0 Complex Wound Measurement - multiple wounds INTERVENTIONS - Wound Dressings X - Small Wound Dressing one or multiple wounds 1 10 []  - 0 Medium Wound Dressing one or multiple wounds []  - 0 Large Wound Dressing one or multiple wounds []  - 0 Application of Medications - topical []  - 0 Application of Medications - injection INTERVENTIONS - Miscellaneous []  - 0 External ear exam []  - 0 Specimen Collection (cultures, biopsies, blood, body fluids, etc.) []  - 0 Specimen(s) / Culture(s) sent or taken to Lab for analysis []  - 0 Patient Transfer (multiple staff / Civil Service fast streamer / Similar devices) []  - 0 Simple Staple / Suture removal (25 or less) []  - 0 Complex Staple / Suture removal (26 or more) []  - 0 Hypo / Hyperglycemic Management (close monitor of Blood Glucose) []  - 0 Ankle / Brachial Index (ABI) - do not check if billed separately X- 1 5 Vital Signs Has the patient been seen at the hospital within the last three years: Yes Total Score: 80 Level Of Care: New/Established - Level 3 Electronic Signature(s) Signed: 01/14/2021 5:11:07 PM By: [name] Barnett Entered By: [name] Barnett on 01/14/2021 14:25:02 -------------------------------------------------------------------------------- Encounter Discharge Information Details Patient Name: Date of Service: [name] Barnett, [name] Barnett 01/14/2021 1:45 PM Medical Record Number: [id number] Patient Account Number: [id number] Date of Birth/Sex: Treating RN: 1981/01/11 (40 y.o. [name] Primary Care [name] Barnett: [name] Barnett Other Clinician: Referring [name] Barnett: Treating [name] Barnett/Extender: [name] Barnett [name] FA LWA LLA,  SHA Barnett Weeks in Treatment: 2 Encounter Discharge Information Items Discharge Condition: Stable Ambulatory Status: Wheelchair Discharge Destination: Home Transportation: Other Schedule Follow-up Appointment: Yes Clinical Summary of Care: Provided on 01/14/2021 Form Type Recipient Paper Patient Patient Electronic Signature(s) Signed: 01/14/2021 5:11:07 PM By: [name] Barnett Entered By: [name] Barnett on 01/14/2021 14:25:50 -------------------------------------------------------------------------------- Lower Extremity Assessment Details Patient Name: Date of Service: [name] Barnett, [name] Barnett 01/14/2021 1:45 PM Medical Record Number: [id number] Patient Account Number: [id number] Date of Birth/Sex: Treating RN: [birth year] (40 y.o. [name] Primary Care Dalin Caldera: [name] FA Janace Aris, SHA Barnett Other Clinician: Referring Ravina Milner: Treating Jaskirat Schwieger/Extender: [name] Barnett [name] Barnett Weeks in Treatment: 2 Electronic Signature(s) Signed: 01/14/2021 5:11:07 PM By: [name] Barnett Entered By: [name] Barnett on 01/14/2021 14:13:11 -------------------------------------------------------------------------------- Multi Wound Chart Details Patient Name: Date of Service: [name] Barnett, [name] Barnett 01/14/2021 1:45 PM Medical Record Number: [id number] Patient Account Number: [id number] Date of Birth/Sex: Treating RN: [birth year] (40 y.o. F) [name] Barnett Primary Care Judithann Villamar: [name] Barnett Other Clinician: Referring Seraya Jobst: Treating Ashaunte Standley/Extender: [name] Barnett [name] Barnett Weeks in Treatment: 2 Vital Signs Height(in): Capillary Blood Glucose(mg/dl): 91 Weight(lbs): Pulse(bpm): 80 Body Mass Index(BMI): Blood Pressure(mmHg): 141/89 Temperature(F): 97.7 Respiratory Rate(breaths/min): 18 Photos: [N/A:N/A] Right Gluteus N/A N/A Wound Location: Pressure Injury N/A N/A Wounding Event: Pressure Ulcer N/A N/A Primary Etiology: Cataracts,  Glaucoma, Anemia, N/A N/A Comorbid History: Congestive Heart Failure, Hypertension, Type II Diabetes, End Stage Renal Disease, Neuropathy, Confinement Anxiety 10/22/2020 N/A N/A Date Acquired: 2 N/A N/A Weeks of Treatment: Open N/A N/A Wound Status: 0.2x0.2x0.2 N/A N/A Measurements L x W x D (cm) 0.031 N/A N/A A (cm) : rea 0.006 N/A N/A Volume (cm) : 96.90% N/A N/A % Reduction in A rea: 98.00% N/A N/A %  Reduction in Volume: Category/Stage II N/A N/A Classification: Medium N/A N/A Exudate A mount: Serosanguineous N/A N/A Exudate Type: red, brown N/A N/A Exudate Color: Distinct, outline attached N/A N/A Wound Margin: Large (67-100%) N/A N/A Granulation A mount: Red, Pink N/A N/A Granulation Quality: None Present (0%) N/A N/A Necrotic A mount: Fat Layer (Subcutaneous Tissue): Yes N/A N/A Exposed Structures: Fascia: No Tendon: No Muscle: No Joint: No Bone: No Large (67-100%) N/A N/A Epithelialization: Treatment Notes Wound #5 (Gluteus) Wound Laterality: Right Cleanser Soap and Water Discharge Instruction: May shower and wash wound with dial antibacterial soap and water prior to dressing change. Wound Cleanser Discharge Instruction: Cleanse the wound with wound cleanser prior to applying a clean dressing using gauze sponges, not tissue or cotton balls. Peri-Wound Care Skin Prep Discharge Instruction: Use skin prep as directed Topical Primary Dressing KerraCel Ag Gelling Fiber Dressing, 2x2 in (silver alginate) Discharge Instruction: Apply silver alginate to wound bed as instructed Secondary Dressing Zetuvit Plus Silicone Border Dressing 4x4 (in/in) Discharge Instruction: Apply silicone border over primary dressing as directed. Secured With Compression Wrap Compression Stockings Environmental education officer) Signed: 01/14/2021 4:44:54 PM By: [name] Ham MD Signed: 01/14/2021 6:35:12 PM By: [name] Gouty RN, BSN Entered By: [name] Barnett on  01/14/2021 14:25:47 -------------------------------------------------------------------------------- Multi-Disciplinary Care Plan Details Patient Name: Date of Service: [name] Barnett, [name] Barnett 01/14/2021 1:45 PM Medical Record Number: [id number] Patient Account Number: [id number] Date of Birth/Sex: Treating RN: November 05, 1980 (40 y.o. [name] Primary Care Orlanda Lemmerman: [name] Barnett Other Clinician: Referring Louanne Calvillo: Treating Natia Fahmy/Extender: [name] Barnett [name] Barnett Weeks in Treatment: 2 Active Inactive Wound/Skin Impairment Nursing Diagnoses: Impaired tissue integrity Knowledge deficit related to ulceration/compromised skin integrity Goals: Patient will have a decrease in wound volume by X% from date: (specify in notes) Date Initiated: 12/31/2020 Target Resolution Date: 01/07/2021 Goal Status: Active Patient/caregiver will verbalize understanding of skin care regimen Date Initiated: 12/31/2020 Target Resolution Date: 01/07/2021 Goal Status: Active Ulcer/skin breakdown will have a volume reduction of 30% by week 4 Date Initiated: 12/31/2020 Date Inactivated: 01/14/2021 Target Resolution Date: 01/10/2021 Goal Status: Met Ulcer/skin breakdown will have a volume reduction of 50% by week 8 Date Initiated: 12/31/2020 Date Inactivated: 01/14/2021 Target Resolution Date: 01/23/2021 Goal Status: Met Ulcer/skin breakdown will have a volume reduction of 80% by week 12 Date Initiated: 12/31/2020 Target Resolution Date: 02/18/2021 Goal Status: Active Interventions: Assess patient/caregiver ability to obtain necessary supplies Assess patient/caregiver ability to perform ulcer/skin care regimen upon admission and as needed Assess ulceration(s) every visit Notes: Electronic Signature(s) Signed: 01/14/2021 5:11:07 PM By: [name] Barnett Entered By: [name] Barnett on 01/14/2021  14:17:18 -------------------------------------------------------------------------------- Pain Assessment Details Patient Name: Date of Service: [name] Barnett, [name] Barnett 01/14/2021 1:45 PM Medical Record Number: [id number] Patient Account Number: [id number] Date of Birth/Sex: Treating RN: Sep 23, 1980 (40 y.o. F) [name] Barnett Primary Care Bridney Guadarrama: [name] Barnett Other Clinician: Referring Arvine Clayburn: Treating Vernia Teem/Extender: [name] Barnett [name] Barnett Weeks in Treatment: 2 Active Problems Location of Pain Severity and Description of Pain Patient Has Paino Yes Site Locations Rate the pain. Current Pain Level: 7 Pain Management and Medication Current Pain Management: Electronic Signature(s) Signed: 01/14/2021 2:59:51 PM By: [name] Barnett Signed: 01/14/2021 6:35:12 PM By: [name] Gouty RN, BSN Entered By: [name] Barnett on 01/14/2021 13:46:15 -------------------------------------------------------------------------------- Patient/Caregiver Education Details Patient Name: Date of Service: [name] DA MEILANI, EDMUNDSON 9/14/2022andnbsp1:45 PM Medical Record Number: [id number] Patient Account Number: [id number] Date of Birth/Gender: Treating RN: June 13, 1980 (  40 y.o. [name] Primary Care Physician: [name] Barnett Other Clinician: Referring Physician: Treating Physician/Extender: [name] Barnett [name] Barnett Weeks in Treatment: 2 Education Assessment Education Provided To: Patient Education Topics Provided Pressure: Methods: Explain/Verbal, Printed Responses: State content correctly Wound/Skin Impairment: Methods: Explain/Verbal, Printed Responses: State content correctly Electronic Signature(s) Signed: 01/14/2021 5:11:07 PM By: [name] Barnett Entered By: [name] Barnett on 01/14/2021 14:17:41 -------------------------------------------------------------------------------- Wound Assessment Details Patient  Name: Date of Service: [name] DA KEISI, ECKFORD 01/14/2021 1:45 PM Medical Record Number: [id number] Patient Account Number: [id number] Date of Birth/Sex: Treating RN: 04-08-1981 (40 y.o. F) [name] Barnett Primary Care Roderick Sweezy: [name] Barnett Other Clinician: Referring Lynnelle Mesmer: Treating Narcissa Melder/Extender: [name] Barnett [name] Barnett Weeks in Treatment: 2 Wound Status Wound Number: 5 Primary Pressure Ulcer Etiology: Wound Location: Right Gluteus Wound Open Wounding Event: Pressure Injury Status: Date Acquired: 10/22/2020 Comorbid Cataracts, Glaucoma, Anemia, Congestive Heart Failure, Weeks Of Treatment: 2 History: Hypertension, Type II Diabetes, End Stage Renal Disease, Clustered Wound: No Neuropathy, Confinement Anxiety Photos Wound Measurements Length: (cm) 0.2 Width: (cm) 0.2 Depth: (cm) 0.2 Area: (cm) 0.031 Volume: (cm) 0.006 % Reduction in Area: 96.9% % Reduction in Volume: 98% Epithelialization: Large (67-100%) Tunneling: No Undermining: No Wound Description Classification: Category/Stage II Wound Margin: Distinct, outline attached Exudate Amount: Medium Exudate Type: Serosanguineous Exudate Color: red, brown Foul Odor After Cleansing: No Slough/Fibrino No Wound Bed Granulation Amount: Large (67-100%) Exposed Structure Granulation Quality: Red, Pink Fascia Exposed: No Necrotic Amount: None Present (0%) Fat Layer (Subcutaneous Tissue) Exposed: Yes Tendon Exposed: No Muscle Exposed: No Joint Exposed: No Bone Exposed: No Assessment Notes maceration noted Treatment Notes Wound #5 (Gluteus) Wound Laterality: Right Cleanser Soap and Water Discharge Instruction: May shower and wash wound with dial antibacterial soap and water prior to dressing change. Wound Cleanser Discharge Instruction: Cleanse the wound with wound cleanser prior to applying a clean dressing using gauze sponges, not tissue or cotton balls. Peri-Wound Care Skin  Prep Discharge Instruction: Use skin prep as directed Topical Primary Dressing KerraCel Ag Gelling Fiber Dressing, 2x2 in (silver alginate) Discharge Instruction: Apply silver alginate to wound bed as instructed Secondary Dressing Zetuvit Plus Silicone Border Dressing 4x4 (in/in) Discharge Instruction: Apply silicone border over primary dressing as directed. Secured With Compression Wrap Compression Stockings Environmental education officer) Signed: 01/14/2021 2:59:51 PM By: [name] Barnett Signed: 01/14/2021 6:35:12 PM By: [name] Gouty RN, BSN Entered By: [name] Barnett on 01/14/2021 14:40:13 -------------------------------------------------------------------------------- Vitals Details Patient Name: Date of Service: [name] DA LILIT, CINELLI 01/14/2021 1:45 PM Medical Record Number: [id number] Patient Account Number: [id number] Date of Birth/Sex: Treating RN: 1981/02/24 (40 y.o. F) [name] Barnett Primary Care Kiyo Heal: [name] Barnett Other Clinician: Referring Jenesis Suchy: Treating Keeton Kassebaum/Extender: [name] Barnett [name] Barnett Weeks in Treatment: 2 Vital Signs Time Taken: 13:48 Temperature (F): 97.7 Pulse (bpm): 80 Respiratory Rate (breaths/min): 18 Blood Pressure (mmHg): 141/89 Capillary Blood Glucose (mg/dl): 91 Reference Range: 80 - 120 mg / dl Electronic Signature(s) Signed: 01/14/2021 2:59:51 PM By: [name] Barnett Entered By: [name] Barnett on 01/14/2021 13:49:52

## 2021-01-14 NOTE — Progress Notes (Signed)
[name] Barnett, [name] Barnett ([id number]) Visit Report for 01/14/2021 HPI Details Patient Name: Date of Service: [name] Barnett, [name] Barnett 01/14/2021 1:45 PM Medical Record Number: [id number] Patient Account Number: [id number] Date of Birth/Sex: Treating RN: 17-Apr-1981 (40 y.o. [name] Primary Care Provider: SITA FA LWA LLA, SHA RMIN Other Clinician: Referring Provider: Treating Provider/Extender: Linton Ham SITA FA LWA LLA, SHA RMIN Weeks in Treatment: 2 History of Present Illness HPI Description: Admission 06/15/2019 This is a 40 year old woman who comes to [place] from [facility] skilled facility in [place]. I am able to follow a lot of her history on care everywhere in epic. The patient is a type II diabetic and has been on dialysis. 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 I am not exactly sure of the nature of the stroke at this present time. She has made a decent recovery and is now up walking with a walker. In August 2020 she was admitted to [facility] in [place] with L4-L5 discitis. Culture and sensitivity showed Klebsiella. She was discharged on 8 weeks of ceftazidime. In mid September she is readmitted to the hospital with a pressure ulcer on her lower sacrum and necrotizing fasciitis. She required an extensive surgical debridement. CT scan of the pelvis at 1 point showed communication with the rectum. A colostomy was recommended and placed. Culture of this area showed Klebsiella and yeast. She was discharged on an extended course of meropenem and Diflucan. [facility] [place] We do not have any of these records. I think she was discharged to another nursing home but is come to [facility] skilled facility in [facility]. She was sent down here for review of the remaining wound. By review of the pictures that the patient had from  September this is made considerable improvement. She tells me that the wound VAC was discontinued at the end of December 2020. Currently the facility is using a form of silver alginate to the wound surface. Past medical history type 2 diabetes with chronic renal failure on dialysis Tuesday Thursday and Saturday. CVA with left hemiparesis in June 2020 but she appears to be making a good recovery. Chronic diastolic heart failure, hypertension 2/19; patient's wound area slightly smaller. The area that was towards her anus has closed over. We are using Santyl covered with Hydrofera Blue. We have not been able to get her lab work apparently her veins are very difficult for phlebotomy. They are trying to get the blood work done where she dialyzes in [facility] but that requires some logistical issues. 2/26; not much change in the wound. Still a nonviable surface we have been using Santyl and Hydrofera Blue. I changed her to [facility] only. She tells me she has rigorously offloading this area she also complains of some pain 3/5; still no change here. I switch to pure Hydrofera Blue. She still complains of a lot of pain although is a big wound it seems out of proportion to what I might expect. This was initially a surgical wound secondary to necrotizing wound infection. She is on dialysis. Other than that she thinks she is offloading this fairly rigorously. She has not been systemically unwell 3/26; I switched to Iodoflex 3 weeks ago. This really seems to have helped, much better surface and slight improvement in the surface area. 4/26; the patient is still on Iodoflex she missed an appointment 2 weeks ago.  Doing slightly smaller. She is on dialysis 5/10; using Hydrofera Blue. The major improvement here is the complete lack of depth. She has islands of epithelialization 09/24/19-Patient comes in at 2 weeks, dimensions about the same, there is an [place] of epithelium at margin of wound, no slough  noted 10/15/2019 upon evaluation today patient appears to be doing well with regard to her wound. She has been tolerating the dressing changes in the sacral region without complication and at this point she seems to be healing quite nicely. I am extremely pleased with how things are progressing and the epithelial tissue seems to be spreading from the central portion as well which is also excellent. 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 We have been using Hydrofera Blue. The smaller area which has depth at 0.7 cm there is a question about whether they have been packing anything into this or just flaring Hydrofera Blue over the top 9/23; its been more than 2 months since this patient was here. She still has a small area distally which is the remanent of her stage IV wound. They have been using Hydrofera Blue. She also has an area on the left gluteal 10/22; 1 month follow-up. I think the original sacral wound is closed however the patient has 2 areas distally 1 over the coccyx a small pinpoint area and the other into the gluteal cleft. Neither 1 of these looks particularly infected. I thought they might be connected but they were not. 11/15; the original sacral wound is closed patient only has 1 remaining area. 1 of these is in the gluteal cleft just below the coccyx. The area over the coccyx from last time I think is all so closed 12/31/2020; this is a patient that we saw for a pressure ulcer over her lower sacrum and coccyx for a long period of time in 2021. She eventually healed over. She is at [facility] skilled facility largely secondary to a remote CVA she had. She is also end-stage renal disease on dialysis secondary to type 2 diabetes. She tells [place] that since about mid June she has had an area more distally than the original sacrum/coccyx wound it looks as though they are putting silver alginate in this area. She is limited in her  ability to offload this I think spending most of her time in a wheelchair although she can stand to transfer. She has an ostomy During her original stay here in 2021 I also list that she had a more distal area into her perineum that closed over well before the more proximal coccyx/sacral wound. She had a CT scan of the abdomen and pelvis on 12/01/2020. This did note first chronic sacral decubitus along the right aspect of her distal sacrum and coccyx. There was not felt to be bone destruction she has chronic changes of bilateral sacroiliitis and chronic endplate destruction and sclerosis at L4-L5 secondary to previous osteomyelitis however no new findings were felt to be seen in terms of bone problems in this area 01/14/2021; 2-week follow-up. Her wound is smaller essentially in the coccyx area. We have been using silver collagen. Electronic Signature(s) Signed: 01/14/2021 4:44:54 PM By: Linton Ham MD Entered By: Linton Ham on 01/14/2021 14:27:19 -------------------------------------------------------------------------------- Physical Exam Details Patient Name: Date of Service: [name] Barnett, [name] Barnett 01/14/2021 1:45 PM Medical Record Number: [id number] Patient Account Number: [id number] Date of Birth/Sex: Treating RN: 1980-08-04 (40 y.o. [name] Primary Care Provider: Lynann Beaver  FA LWA LLA, SHA RMIN Other Clinician: Referring Provider: Treating Provider/Extender: Linton Ham SITA FA LWA LLA, SHA RMIN Weeks in Treatment: 2 Constitutional Sitting or standing Blood Pressure is within target range for patient.. Pulse regular and within target range for patient.Marland Kitchen Respirations regular, non-labored and within target range.. Temperature is normal and within the target range for the patient.Marland Kitchen Appears in no distress. Notes Wound exam; this is in her lower sacrum/coccyx. The wound looks very healthy here with healthy granulation small, shaped area. Around this the tissue seems somewhat  macerated. There is no evidence of surrounding infection. Electronic Signature(s) Signed: 01/14/2021 4:44:54 PM By: Linton Ham MD Entered By: Linton Ham on 01/14/2021 14:28:09 -------------------------------------------------------------------------------- Physician Orders Details Patient Name: Date of Service: [name] Barnett, [name] Barnett 01/14/2021 1:45 PM Medical Record Number: [id number] Patient Account Number: [id number] Date of Birth/Sex: Treating RN: 28-Jul-1980 (40 y.o. [name] Primary Care Provider: SITA FA LWA LLA, SHA RMIN Other Clinician: Referring Provider: Treating Provider/Extender: Linton Ham SITA FA LWA LLA, SHA RMIN Weeks in Treatment: 2 Verbal / Phone Orders: No Diagnosis Coding ICD-10 Coding Code Description L89.312 Pressure ulcer of right buttock, stage 2 Follow-up Appointments ppointment in 2 weeks. - Dr. Dellia Nims Return A Bathing/ Shower/ Hygiene May shower with protection but do not get wound dressing(s) wet. Off-Loading Turn and reposition every 2 hours Wound Treatment Wound #5 - Gluteus Wound Laterality: Right Cleanser: Soap and Water Every Other Day/15 Days Discharge Instructions: May shower and wash wound with dial antibacterial soap and water prior to dressing change. Cleanser: Wound Cleanser Every Other Day/15 Days Discharge Instructions: Cleanse the wound with wound cleanser prior to applying a clean dressing using gauze sponges, not tissue or cotton balls. Peri-Wound Care: Skin Prep Every Other Day/15 Days Discharge Instructions: Use skin prep as directed Prim Dressing: KerraCel Ag Gelling Fiber Dressing, 2x2 in (silver alginate) Every Other Day/15 Days ary Discharge Instructions: Apply silver alginate to wound bed as instructed Secondary Dressing: Zetuvit Plus Silicone Border Dressing 4x4 (in/in) Every Other Day/15 Days Discharge Instructions: Apply silicone border over primary dressing as directed. Electronic  Signature(s) Signed: 01/14/2021 4:44:54 PM By: Linton Ham MD Signed: 01/14/2021 5:11:07 PM By: Lorrin Jackson Entered By: Lorrin Jackson on 01/14/2021 14:24:07 -------------------------------------------------------------------------------- Problem List Details Patient Name: Date of Service: [name] Barnett, [name] Barnett 01/14/2021 1:45 PM Medical Record Number: [id number] Patient Account Number: [id number] Date of Birth/Sex: Treating RN: 1980/10/30 (40 y.o. [name] Primary Care Provider: SITA FA LWA LLA, SHA RMIN Other Clinician: Referring Provider: Treating Provider/Extender: Linton Ham SITA FA LWA LLA, SHA RMIN Weeks in Treatment: 2 Active Problems ICD-10 Encounter Code Description Active Date MDM Diagnosis L89.312 Pressure ulcer of right buttock, stage 2 12/31/2020 No Yes Inactive Problems Resolved Problems Electronic Signature(s) Signed: 01/14/2021 4:44:54 PM By: Linton Ham MD Entered By: Linton Ham on 01/14/2021 14:25:40 -------------------------------------------------------------------------------- Progress Note Details Patient Name: Date of Service: [name] Barnett, [name] Barnett 01/14/2021 1:45 PM Medical Record Number: [id number] Patient Account Number: [id number] Date of Birth/Sex: Treating RN: 04-11-1981 (40 y.o. [name] Primary Care Provider: SITA FA LWA LLA, SHA RMIN Other Clinician: Referring Provider: Treating Provider/Extender: Linton Ham SITA FA LWA LLA, SHA RMIN Weeks in Treatment: 2 Subjective History of Present Illness (HPI) Admission 06/15/2019 This is a 40 year old woman who comes to [place] from [facility] skilled facility in [facility]. I am able to follow a lot of her history on care everywhere in epic. The patient is a type II diabetic and has been  on dialysis. 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 I am not exactly sure of the nature of the stroke at  this present time. She has made a decent recovery and is now up walking with a walker. In August 2020 she was admitted to [facility] in [place] with L4-L5 discitis. Culture and sensitivity showed Klebsiella. She was discharged on 8 weeks of ceftazidime. In mid September she is readmitted to the hospital with a pressure ulcer on her lower sacrum and necrotizing fasciitis. She required an extensive surgical debridement. CT scan of the pelvis at 1 point showed communication with the rectum. A colostomy was recommended and placed. Culture of this area showed Klebsiella and yeast. She was discharged on an extended course of meropenem and Diflucan. [facility] [place] We do not have any of these records. I think she was discharged to another nursing home but is come to [facility] skilled facility in [facility]. She was sent down here for review of the remaining wound. By review of the pictures that the patient had from September this is made considerable improvement. She tells me that the wound VAC was discontinued at the end of December 2020. Currently the facility is using a form of silver alginate to the wound surface. Past medical history type 2 diabetes with chronic renal failure on dialysis Tuesday Thursday and Saturday. CVA with left hemiparesis in June 2020 but she appears to be making a good recovery. Chronic diastolic heart failure, hypertension 2/19; patient's wound area slightly smaller. The area that was towards her anus has closed over. We are using Santyl covered with Hydrofera Blue. We have not been able to get her lab work apparently her veins are very difficult for phlebotomy. They are trying to get the blood work done where she dialyzes in [facility] but that requires some logistical issues. 2/26; not much change in the wound. Still a nonviable surface we have been using Santyl and Hydrofera Blue. I  changed her to [facility] only. She tells me she has rigorously offloading this area she also complains of some pain 3/5; still no change here. I switch to pure Hydrofera Blue. She still complains of a lot of pain although is a big wound it seems out of proportion to what I might expect. This was initially a surgical wound secondary to necrotizing wound infection. She is on dialysis. Other than that she thinks she is offloading this fairly rigorously. She has not been systemically unwell 3/26; I switched to Iodoflex 3 weeks ago. This really seems to have helped, much better surface and slight improvement in the surface area. 4/26; the patient is still on Iodoflex she missed an appointment 2 weeks ago. Doing slightly smaller. She is on dialysis 5/10; using Hydrofera Blue. The major improvement here is the complete lack of depth. She has islands of epithelialization 09/24/19-Patient comes in at 2 weeks, dimensions about the same, there is an [place] of epithelium at margin of wound, no slough noted 10/15/2019 upon evaluation today patient appears to be doing well with regard to her wound. She has been tolerating the dressing changes in the sacral region without complication and at this point she seems to be healing quite nicely. I am extremely pleased with how things are progressing and the epithelial tissue seems to be spreading from the central portion as well which is also excellent. 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 We have been using Hydrofera Blue. The smaller area which has depth at 0.7 cm there is a question about whether they have been packing anything into this or just flaring Hydrofera Blue over the top 9/23; its been more than 2 months since this patient was here. She still has a small area distally which is the remanent of her stage IV wound. They have been using Hydrofera Blue. She also has an area on the left  gluteal 10/22; 1 month follow-up. I think the original sacral wound is closed however the patient has 2 areas distally 1 over the coccyx a small pinpoint area and the other into the gluteal cleft. Neither 1 of these looks particularly infected. I thought they might be connected but they were not. 11/15; the original sacral wound is closed patient only has 1 remaining area. 1 of these is in the gluteal cleft just below the coccyx. The area over the coccyx from last time I think is all so closed 12/31/2020; this is a patient that we saw for a pressure ulcer over her lower sacrum and coccyx for a long period of time in 2021. She eventually healed over. She is at [facility] skilled facility largely secondary to a remote CVA she had. She is also end-stage renal disease on dialysis secondary to type 2 diabetes. She tells [place] that since about mid June she has had an area more distally than the original sacrum/coccyx wound it looks as though they are putting silver alginate in this area. She is limited in her ability to offload this I think spending most of her time in a wheelchair although she can stand to transfer. She has an ostomy During her original stay here in 2021 I also list that she had a more distal area into her perineum that closed over well before the more proximal coccyx/sacral wound. She had a CT scan of the abdomen and pelvis on 12/01/2020. This did note first chronic sacral decubitus along the right aspect of her distal sacrum and coccyx. There was not felt to be bone destruction she has chronic changes of bilateral sacroiliitis and chronic endplate destruction and sclerosis at L4-L5 secondary to previous osteomyelitis however no new findings were felt to be seen in terms of bone problems in this area 01/14/2021; 2-week follow-up. Her wound is smaller essentially in the coccyx area. We have been using silver collagen. Objective Constitutional Sitting or standing Blood Pressure is within  target range for patient.. Pulse regular and within target range for patient.Marland Kitchen Respirations regular, non-labored and within target range.. Temperature is normal and within the target range for the patient.Marland Kitchen Appears in no distress. Vitals Time Taken: 1:48 PM, Temperature: 97.7 F, Pulse: 80 bpm, Respiratory Rate: 18 breaths/min, Blood Pressure: 141/89 mmHg, Capillary Blood Glucose: 91 mg/dl. General Notes: Wound exam; this is in her lower sacrum/coccyx. The wound looks very healthy here with healthy granulation small, shaped area. Around this the tissue seems somewhat macerated. There is no evidence of surrounding infection. Integumentary (Hair, Skin) Wound #5 status is Open. Original cause of wound was Pressure Injury. The date acquired was: 10/22/2020. The wound has been in treatment 2 weeks. The wound is located on the Right Gluteus. The wound measures 0.2cm length x 0.2cm width x 0.2cm depth; 0.031cm^2 area and 0.006cm^3 volume. There is Fat Layer (Subcutaneous Tissue) exposed. There is no tunneling or undermining noted. There is a medium amount  of serosanguineous drainage noted. The wound margin is distinct with the outline attached to the wound base. There is large (67-100%) red, pink granulation within the wound bed. There is no necrotic tissue within the wound bed. Assessment Active Problems ICD-10 Pressure ulcer of right buttock, stage 2 Plan Follow-up Appointments: Return Appointment in 2 weeks. - Dr. Dellia Nims Bathing/ Shower/ Hygiene: May shower with protection but do not get wound dressing(s) wet. Off-Loading: Turn and reposition every 2 hours WOUND #5: - Gluteus Wound Laterality: Right Cleanser: Soap and Water Every Other Day/15 Days Discharge Instructions: May shower and wash wound with dial antibacterial soap and water prior to dressing change. Cleanser: Wound Cleanser Every Other Day/15 Days Discharge Instructions: Cleanse the wound with wound cleanser prior to applying a  clean dressing using gauze sponges, not tissue or cotton balls. Peri-Wound Care: Skin Prep Every Other Day/15 Days Discharge Instructions: Use skin prep as directed Prim Dressing: KerraCel Ag Gelling Fiber Dressing, 2x2 in (silver alginate) Every Other Day/15 Days ary Discharge Instructions: Apply silver alginate to wound bed as instructed Secondary Dressing: Zetuvit Plus Silicone Border Dressing 4x4 (in/in) Every Other Day/15 Days Discharge Instructions: Apply silicone border over primary dressing as directed. 1. Because of the periwound maceration I change the dressing to silver alginate from silver collagen I am hoping this will dry the wound out and let it close over. 2. The patient is doing the best she can at dialysis to keep the pressure off this area 3. Follow-up 2 weeks Electronic Signature(s) Signed: 01/14/2021 4:44:54 PM By: Linton Ham MD Entered By: Linton Ham on 01/14/2021 14:29:21 -------------------------------------------------------------------------------- SuperBill Details Patient Name: Date of Service: [name] Barnett, [name] Barnett 01/14/2021 Medical Record Number: [id number] Patient Account Number: [id number] Date of Birth/Sex: Treating RN: [birth year] (40 y.o. [name] Primary Care Provider: SITA FA LWA LLA, SHA RMIN Other Clinician: Referring Provider: Treating Provider/Extender: Linton Ham SITA FA LWA LLA, SHA RMIN Weeks in Treatment: 2 Diagnosis Coding ICD-10 Codes Code Description [id number] Pressure ulcer of right buttock, stage 2 Facility Procedures CPT4 Code: [id number] Description: [id number] - WOUND CARE VISIT-LEV 3 EST PT Modifier: Quantity: 1 Physician Procedures : CPT4 Code Description Modifier [id number] - WC PHYS LEVEL 3 - EST PT ICD-10 Diagnosis Description [id number] Pressure ulcer of right buttock, stage 2 Quantity: 1 Electronic Signature(s) Signed: 01/14/2021 4:44:54 PM By: Linton Ham MD Entered By: Linton Ham on 01/14/2021  14:29:32

## 2021-01-22 ENCOUNTER — Ambulatory Visit: Payer: Medicare Other | Admitting: Specialist

## 2021-01-23 ENCOUNTER — Encounter (HOSPITAL_BASED_OUTPATIENT_CLINIC_OR_DEPARTMENT_OTHER): Payer: Medicare Other | Admitting: Internal Medicine

## 2021-01-28 ENCOUNTER — Encounter (HOSPITAL_BASED_OUTPATIENT_CLINIC_OR_DEPARTMENT_OTHER): Payer: Medicare Other | Admitting: Internal Medicine

## 2021-01-29 ENCOUNTER — Encounter: Payer: Medicare Other | Admitting: Diagnostic Neuroimaging

## 2021-01-29 ENCOUNTER — Encounter (INDEPENDENT_AMBULATORY_CARE_PROVIDER_SITE_OTHER): Payer: Medicare Other | Admitting: Diagnostic Neuroimaging

## 2021-01-29 ENCOUNTER — Ambulatory Visit (INDEPENDENT_AMBULATORY_CARE_PROVIDER_SITE_OTHER): Payer: Medicare Other | Admitting: Diagnostic Neuroimaging

## 2021-01-29 DIAGNOSIS — R29898 Other symptoms and signs involving the musculoskeletal system: Secondary | ICD-10-CM | POA: Diagnosis not present

## 2021-01-29 DIAGNOSIS — Z0289 Encounter for other administrative examinations: Secondary | ICD-10-CM

## 2021-01-29 NOTE — Procedures (Signed)
[facility]  NCS (NERVE CONDUCTION STUDY) WITH EMG (ELECTROMYOGRAPHY) REPORT   STUDY DATE: 01/29/21 PATIENT NAME: [name] Barnett DOB: 09-18-1980 MRN: [id number]  ORDERING CLINICIAN: Andrey Spearman, MD / Antionette Fairy, MD  TECHNOLOGIST: Sherre Scarlet ELECTROMYOGRAPHER: Earlean Polka. Jhoselin Crume, MD  CLINICAL INFORMATION: 40 year old female with lower extremity weakness (left worse than right). History of diabetic neuropathy, left hemiparesis from prior stroke, and lumbar osteomyelitis and disciitis. Evaluate for lumbar radiculopathy.   FINDINGS: NERVE CONDUCTION STUDY: Left ulnar motor response has prolonged distal latency, decreased amplitude and slow conduction velocity.  Left peroneal and left tibial motor responses could not be obtained.  Left radial sensory response has decreased amplitude and normal peak latency.  Left sural, left superficial peroneal and left ulnar sensory responses could not be obtained.  Left tibial F-wave latency could not be obtained.  Left ulnar F wave latency is prolonged.   NEEDLE ELECTROMYOGRAPHY:  Needle examination of left lower extremity shows chronic denervation in left tibialis anterior and left gastrocnemius muscles.  Left lumbar paraspinal muscles and left vastus medialis are unremarkable.  No abnormal spontaneous activity.   IMPRESSION:   Abnormal study demonstrating: - Severe length dependent, axonal sensorimotor polyneuropathy.  Considerations would include neuropathy secondary to diabetes and end-stage renal disease.   - No definite electrodiagnostic evidence of superimposed left lumbar radiculopathy, but given the clinical context and severe neuropathy in this context, lumbar radiculopathies cannot be totally excluded.  Recommend to correlate with neuroimaging and clinical findings.    INTERPRETING PHYSICIAN:  Penni Bombard, MD Certified in Neurology, Neurophysiology and Neuroimaging  [facility] [address] [place], [place] [address] [phone]  [facility]    Nerve / Sites Muscle Latency Ref. Amplitude Ref. Rel Amp Segments Distance Velocity Ref. Area    ms ms mV mV %  cm m/s m/s mVms  L Ulnar - ADM     Wrist ADM 4.5 ?3.3 0.9 ?6.0 100 Wrist - ADM 7   2.3     B.Elbow ADM 10.8  1.5  164 B.Elbow - Wrist 19 30 ?49 4.5     A.Elbow ADM 13.0  2.5  166 A.Elbow - B.Elbow 10 45 ?49 7.5  L Peroneal - EDB     Ankle EDB NR ?6.5 NR ?2.0 NR Ankle - EDB 9   NR     Fib head EDB NR  NR  NR Fib head - Ankle 25 NR ?44 NR     Pop fossa EDB NR  NR  NR Pop fossa - Fib head 10 NR ?44 NR         Pop fossa - Ankle      L Tibial - AH     Ankle AH NR ?5.8 NR ?4.0 NR Ankle - AH 9   NR     Pop fossa AH NR  NR  NR Pop fossa - Ankle 34 NR ?41 NR           SNC    Nerve / Sites Rec. Site Peak Lat Ref.  Amp Ref. Segments Distance    ms ms V V  cm  L Radial - Anatomical snuff box (Forearm)     Forearm Wrist 2.8 ?2.9 4 ?15 Forearm - Wrist 10  L Sural - Ankle (Calf)     Calf Ankle NR ?4.4 NR ?6 Calf - Ankle 14  L Superficial peroneal - Ankle     Lat leg Ankle NR ?4.4 NR ?6 Lat  leg - Ankle 14  L Ulnar - Orthodromic, (Dig V, Mid palm)     Dig V Wrist NR ?3.1 NR ?5 Dig V - Wrist 47             F  Wave    Nerve F Lat Ref.   ms ms  L Tibial - AH NR ?56.0  L Ulnar - ADM 36.7 ?32.0         EMG Summary Table    Spontaneous MUAP Recruitment  Muscle IA Fib PSW Fasc Other Amp Dur. Poly Pattern  L. Tibialis anterior Normal None None None _______ Increased Normal Normal Reduced  L. Gastrocnemius (Medial head) Normal None None None _______ Increased Normal Normal Reduced  L. Vastus medialis Normal None None None _______ Normal Normal Normal Normal  L. Lumbar paraspinals Normal None None None _______ Normal Normal Normal Normal

## 2021-02-04 ENCOUNTER — Other Ambulatory Visit: Payer: Self-pay

## 2021-02-04 ENCOUNTER — Encounter (HOSPITAL_BASED_OUTPATIENT_CLINIC_OR_DEPARTMENT_OTHER): Payer: Medicare Other | Attending: Internal Medicine | Admitting: Internal Medicine

## 2021-02-04 DIAGNOSIS — L89152 Pressure ulcer of sacral region, stage 2: Secondary | ICD-10-CM | POA: Diagnosis not present

## 2021-02-04 DIAGNOSIS — E1122 Type 2 diabetes mellitus with diabetic chronic kidney disease: Secondary | ICD-10-CM | POA: Insufficient documentation

## 2021-02-04 DIAGNOSIS — N186 End stage renal disease: Secondary | ICD-10-CM | POA: Diagnosis not present

## 2021-02-04 DIAGNOSIS — I5032 Chronic diastolic (congestive) heart failure: Secondary | ICD-10-CM | POA: Diagnosis not present

## 2021-02-04 DIAGNOSIS — Z992 Dependence on renal dialysis: Secondary | ICD-10-CM | POA: Insufficient documentation

## 2021-02-04 DIAGNOSIS — I69398 Other sequelae of cerebral infarction: Secondary | ICD-10-CM | POA: Insufficient documentation

## 2021-02-04 DIAGNOSIS — I132 Hypertensive heart and chronic kidney disease with heart failure and with stage 5 chronic kidney disease, or end stage renal disease: Secondary | ICD-10-CM | POA: Diagnosis not present

## 2021-02-04 DIAGNOSIS — L89312 Pressure ulcer of right buttock, stage 2: Secondary | ICD-10-CM | POA: Insufficient documentation

## 2021-02-05 NOTE — Progress Notes (Signed)
MCNEILL KIMIKA, STREATER ([id number]) Visit Report for 02/04/2021 Arrival Information Details Patient Name: Date of Service: MCNEILL NATAUSHA, JUNGWIRTH 02/04/2021 12:30 PM Medical Record Number: [id number] Patient Account Number: [id number] Date of Birth/Sex: Treating RN: [birth year] (40 y.o. [name] Primary Care Deandre Brannan: SITA FA LWA LLA, SHA RMIN Other Clinician: Referring Aretha Levi: Treating Rayner Erman/Extender: Linton Ham SITA FA LWA LLA, SHA RMIN Weeks in Treatment: 5 Visit Information History Since Last Visit Added or deleted any medications: No Patient Arrived: Wheel Chair Any new allergies or adverse reactions: No Arrival Time: 12:40 Had a fall or experienced change in No Accompanied By: alone activities of daily living that may affect Transfer Assistance: Manual risk of falls: Patient Identification Verified: Yes Signs or symptoms of abuse/neglect since last visito No Secondary Verification Process Completed: Yes Hospitalized since last visit: No Patient Requires Transmission-Based Precautions: No Implantable device outside of the clinic excluding No Patient Has Alerts: No cellular tissue based products placed in the center since last visit: Has Dressing in Place as Prescribed: Yes Pain Present Now: No Electronic Signature(s) Signed: 02/05/2021 6:21:03 PM By: Levan Hurst RN, BSN Entered By: Levan Hurst on 02/04/2021 13:01:06 -------------------------------------------------------------------------------- Clinic Level of Care Assessment Details Patient Name: Date of Service: MCNEILL DA YARIELIS, FUNARO 02/04/2021 12:30 PM Medical Record Number: [id number] Patient Account Number: [id number] Date of Birth/Sex: Treating RN: [birth year] (40 y.o. [name] Primary Care Zuleyma Scharf: SITA FA LWA LLA, SHA RMIN Other Clinician: Referring Deliliah Spranger: Treating Nahlia Hellmann/Extender: Linton Ham SITA FA LWA LLA, SHA RMIN Weeks in Treatment: 5 Clinic Level of Care  Assessment Items TOOL 4 Quantity Score X- 1 0 Use when only an EandM is performed on FOLLOW-UP visit ASSESSMENTS - Nursing Assessment / Reassessment X- 1 10 Reassessment of Co-morbidities (includes updates in patient status) X- 1 5 Reassessment of Adherence to Treatment Plan ASSESSMENTS - Wound and Skin A ssessment / Reassessment X - Simple Wound Assessment / Reassessment - one wound 1 5 _0  - 0 Complex Wound Assessment / Reassessment - multiple wounds _1  - 0 Dermatologic / Skin Assessment (not related to wound area) ASSESSMENTS - Focused Assessment _2  - 0 Circumferential Edema Measurements - multi extremities _3  - 0 Nutritional Assessment / Counseling / Intervention _4  - 0 Lower Extremity Assessment (monofilament, tuning fork, pulses) _5  - 0 Peripheral Arterial Disease Assessment (using hand held doppler) ASSESSMENTS - Ostomy and/or Continence Assessment and Care _6  - 0 Incontinence Assessment and Management _7  - 0 Ostomy Care Assessment and Management (repouching, etc.) PROCESS - Coordination of Care X - Simple Patient / Family Education for ongoing care 1 15 _8  - 0 Complex (extensive) Patient / Family Education for ongoing care X- 1 10 Staff obtains Programmer, systems, Records, T Results / Process Orders est X- 1 10 Staff telephones HHA, Nursing Homes / Clarify orders / etc _9  - 0 Routine Transfer to another Facility (non-emergent condition) _10  - 0 Routine Hospital Admission (non-emergent condition) _11  - 0 New Admissions / Biomedical engineer / Ordering NPWT Apligraf, etc. , _12  - 0 Emergency Hospital Admission (emergent condition) X- 1 10 Simple Discharge Coordination _13  - 0 Complex (extensive) Discharge Coordination PROCESS - Special Needs _14  - 0 Pediatric / Minor Patient Management _15  - 0 Isolation Patient Management _16  - 0 Hearing / Language / Visual special needs _17  - 0 Assessment of Community assistance (transportation, D/C planning, etc.) _18  -  0 Additional assistance / Altered mentation _19  - 0 Support Surface(s) Assessment (bed, cushion, seat, etc.) INTERVENTIONS - Wound Cleansing / Measurement X -  Simple Wound Cleansing - one wound 1 5 _0  - 0 Complex Wound Cleansing - multiple wounds X- 1 5 Wound Imaging (photographs - any number of wounds) _1  - 0 Wound Tracing (instead of photographs) X- 1 5 Simple Wound Measurement - one wound _2  - 0 Complex Wound Measurement - multiple wounds INTERVENTIONS - Wound Dressings X - Small Wound Dressing one or multiple wounds 1 10 _3  - 0 Medium Wound Dressing one or multiple wounds _4  - 0 Large Wound Dressing one or multiple wounds <AJGOTLXBWIOMBTDH>_7<\/CBULAGTXMIWOEHOZ>_2  - 0 Application of Medications - topical <YQMGNOIBBCWUGQBV>_6<\/XIHWTUUEKCMKLKJZ>_7  - 0 Application of Medications - injection INTERVENTIONS - Miscellaneous _7  - 0 External ear exam _8  - 0 Specimen Collection (cultures, biopsies, blood, body fluids, etc.) _9  - 0 Specimen(s) / Culture(s) sent or taken to Lab for analysis _10  - 0 Patient Transfer (multiple staff / Civil Service fast streamer / Similar devices) _11  - 0 Simple Staple / Suture removal (25 or less) _12  - 0 Complex Staple / Suture removal (26 or more) _13  - 0 Hypo / Hyperglycemic Management (close monitor of Blood Glucose) _14  - 0 Ankle / Brachial Index (ABI) - do not check if billed separately X- 1 5 Vital Signs Has the patient been seen at the hospital within the last three years: Yes Total Score: 95 Level Of Care: New/Established - Level 3 Electronic Signature(s) Signed: 02/05/2021 6:21:03 PM By: Levan Hurst RN, BSN Entered By: Levan Hurst on 02/04/2021 13:38:38 -------------------------------------------------------------------------------- Encounter Discharge Information Details Patient Name: Date of Service: MCNEILL DA MAISEY, DEANDRADE 02/04/2021 12:30 PM Medical Record Number: [id number] Patient Account Number: [id number] Date of Birth/Sex: Treating RN: 31-Jul-1980 (40 y.o. [name] Primary Care Leilanni Halvorson: SITA FA LWA LLA,  SHA RMIN Other Clinician: Referring Nasiah Lehenbauer: Treating Upton Russey/Extender: Linton Ham SITA FA LWA LLA, SHA RMIN Weeks in Treatment: 5 Encounter Discharge Information Items Discharge Condition: Stable Ambulatory Status: Wheelchair Discharge Destination: Home Transportation: Private Auto Accompanied By: alone Schedule Follow-up Appointment: Yes Clinical Summary of Care: Patient Declined Electronic Signature(s) Signed: 02/05/2021 6:21:03 PM By: Levan Hurst RN, BSN Entered By: Levan Hurst on 02/04/2021 13:40:50 -------------------------------------------------------------------------------- Multi Wound Chart Details Patient Name: Date of Service: MCNEILL DA ANTHA, NIDAY 02/04/2021 12:30 PM Medical Record Number: [id number] Patient Account Number: [id number] Date of Birth/Sex: Treating RN: [birth year] (40 y.o. [name] Primary Care Shyasia Funches: SITA FA LWA LLA, SHA RMIN Other Clinician: Referring Luda Charbonneau: Treating Gradyn Shein/Extender: Linton Ham SITA FA LWA LLA, SHA RMIN Weeks in Treatment: 5 Vital Signs Height(in): Capillary Blood Glucose(mg/dl): 151 Weight(lbs): Pulse(bpm): 82 Body Mass Index(BMI): Blood Pressure(mmHg): 127/85 Temperature(F): 98.6 Respiratory Rate(breaths/min): 16 Photos: [5:Right Gluteus] [6:Perineum] [N/A:N/A N/A] Wound Location: [5:Pressure Injury] [6:Pressure Injury] [N/A:N/A] Wounding Event: [5:Pressure Ulcer] [6:Pressure Ulcer] [N/A:N/A] Primary Etiology: [5:Cataracts, Glaucoma, Anemia,] [6:Cataracts, Glaucoma, Anemia,] [N/A:N/A] Comorbid History: [5:Congestive Heart Failure, Hypertension, Type II Diabetes, End Stage Renal Disease, Neuropathy, Confinement Anxiety 10/22/2020] [6:Congestive Heart Failure, Hypertension, Type II Diabetes, End Stage Renal Disease, Neuropathy,  Confinement Anxiety 02/04/2021] [N/A:N/A] Date Acquired: [5:5] [6:0] [N/A:N/A] Weeks of Treatment: [5:Open] [6:Open] [N/A:N/A] Wound Status: [5:0x0x0] [6:2.8x0.8x0.2]  [N/A:N/A] Measurements L x W x D (cm) [5:0] [6:1.759] [N/A:N/A] A (cm) : rea [5:0] [6:0.352] [N/A:N/A] Volume (cm) : [5:100.00%] [6:N/A] [N/A:N/A] % Reduction in A rea: [5:100.00%] [6:N/A] [N/A:N/A] % Reduction in Volume: [5:Category/Stage II] [6:Category/Stage II] [N/A:N/A] Classification: [5:None Present] [6:Medium] [N/A:N/A] Exudate A mount: [5:N/A] [6:Serosanguineous] [N/A:N/A] Exudate Type: [5:N/A] [6:red, brown] [N/A:N/A] Exudate Color: [5:Distinct, outline attached] [6:Flat and Intact] [N/A:N/A] Wound Margin: [5:None Present (0%)] [6:Large (67-100%)] [N/A:N/A] Granulation A mount: [5:N/A] [  6:Pink] [N/A:N/A] Granulation Quality: [5:None Present (0%)] [6:None Present (0%)] [N/A:N/A] Necrotic A mount: [5:Fascia: No] [6:Fat Layer (Subcutaneous Tissue): Yes N/A] Exposed Structures: [5:Fat Layer (Subcutaneous Tissue): No Tendon: No Muscle: No Joint: No Bone: No Large (67-100%)] [6:Fascia: No Tendon: No Muscle: No Joint: No Bone: No None] [N/A:N/A] Treatment Notes Electronic Signature(s) Signed: 02/05/2021 5:45:05 PM By: Linton Ham MD Signed: 02/05/2021 6:21:03 PM By: Levan Hurst RN, BSN Entered By: Linton Ham on 02/04/2021 13:21:18 -------------------------------------------------------------------------------- Multi-Disciplinary Care Plan Details Patient Name: Date of Service: MCNEILL DA DAWNIELLE, CHRISTIANA 02/04/2021 12:30 PM Medical Record Number: [id number] Patient Account Number: [id number] Date of Birth/Sex: Treating RN: April 26, 1981 (40 y.o. [name] Primary Care Parminder Cupples: SITA FA LWA LLA, SHA RMIN Other Clinician: Referring Pleasant Bensinger: Treating Keira Bohlin/Extender: Linton Ham SITA FA LWA LLA, SHA RMIN Weeks in Treatment: 5 Active Inactive Wound/Skin Impairment Nursing Diagnoses: Impaired tissue integrity Knowledge deficit related to ulceration/compromised skin integrity Goals: Patient will have a decrease in wound volume by X% from date: (specify in  notes) Date Initiated: 12/31/2020 Date Inactivated: 02/04/2021 Target Resolution Date: 01/07/2021 Goal Status: Met Patient/caregiver will verbalize understanding of skin care regimen Date Initiated: 12/31/2020 Target Resolution Date: 03/06/2021 Goal Status: Active Ulcer/skin breakdown will have a volume reduction of 30% by week 4 Date Initiated: 12/31/2020 Date Inactivated: 01/14/2021 Target Resolution Date: 01/10/2021 Goal Status: Met Ulcer/skin breakdown will have a volume reduction of 50% by week 8 Date Initiated: 12/31/2020 Date Inactivated: 01/14/2021 Target Resolution Date: 01/23/2021 Goal Status: Met Ulcer/skin breakdown will have a volume reduction of 80% by week 12 Date Initiated: 12/31/2020 Date Inactivated: 02/04/2021 Target Resolution Date: 02/18/2021 Goal Status: Met Interventions: Assess patient/caregiver ability to obtain necessary supplies Assess patient/caregiver ability to perform ulcer/skin care regimen upon admission and as needed Assess ulceration(s) every visit Notes: Electronic Signature(s) Signed: 02/05/2021 6:21:03 PM By: Levan Hurst RN, BSN Entered By: Levan Hurst on 02/04/2021 13:11:13 -------------------------------------------------------------------------------- Pain Assessment Details Patient Name: Date of Service: MCNEILL DA LAURINDA, CARRENO 02/04/2021 12:30 PM Medical Record Number: [id number] Patient Account Number: [id number] Date of Birth/Sex: Treating RN: 01/15/1981 (40 y.o. [name] Primary Care Ruqaya Strauss: SITA FA LWA LLA, SHA RMIN Other Clinician: Referring Mayela Bullard: Treating Dezaree Tracey/Extender: Linton Ham SITA FA LWA LLA, SHA RMIN Weeks in Treatment: 5 Active Problems Location of Pain Severity and Description of Pain Patient Has Paino No Site Locations Pain Management and Medication Current Pain Management: Electronic Signature(s) Signed: 02/05/2021 6:21:03 PM By: Levan Hurst RN, BSN Entered By: Levan Hurst on 02/04/2021  13:01:37 -------------------------------------------------------------------------------- Patient/Caregiver Education Details Patient Name: Date of Service: MCNEILL DA Kirby Crigler 10/5/2022andnbsp12:30 PM Medical Record Number: [id number] Patient Account Number: [id number] Date of Birth/Gender: Treating RN: May 29, 1980 (40 y.o. [name] Primary Care Physician: SITA FA LWA LLA, SHA RMIN Other Clinician: Referring Physician: Treating Physician/Extender: Linton Ham SITA FA LWA LLA, SHA RMIN Weeks in Treatment: 5 Education Assessment Education Provided To: Patient Education Topics Provided Wound/Skin Impairment: Methods: Explain/Verbal Responses: State content correctly Electronic Signature(s) Signed: 02/05/2021 6:21:03 PM By: Levan Hurst RN, BSN Entered By: Levan Hurst on 02/04/2021 13:11:23 -------------------------------------------------------------------------------- Wound Assessment Details Patient Name: Date of Service: MCNEILL DA MARGREE, GIMBEL 02/04/2021 12:30 PM Medical Record Number: [id number] Patient Account Number: [id number] Date of Birth/Sex: Treating RN: 05-01-1981 (40 y.o. [name] Primary Care Laithan Conchas: SITA FA LWA LLA, SHA RMIN Other Clinician: Referring Kristopher Attwood: Treating Azara Gemme/Extender: Linton Ham SITA FA LWA LLA, SHA RMIN Weeks in Treatment: 5 Wound Status Wound Number: 5 Primary Pressure Ulcer Etiology: Wound Location: Right Gluteus  Wound Open Wounding Event: Pressure Injury Status: Date Acquired: 10/22/2020 Comorbid Cataracts, Glaucoma, Anemia, Congestive Heart Failure, Weeks Of Treatment: 5 History: Hypertension, Type II Diabetes, End Stage Renal Disease, Clustered Wound: No Neuropathy, Confinement Anxiety Photos Wound Measurements Length: (cm) Width: (cm) Depth: (cm) Area: (cm) Volume: (cm) 0 % Reduction in Area: 100% 0 % Reduction in Volume: 100% 0 Epithelialization: Large (67-100%) 0 Tunneling: No 0  Undermining: No Wound Description Classification: Category/Stage II Wound Margin: Distinct, outline attached Exudate Amount: None Present Foul Odor After Cleansing: No Slough/Fibrino No Wound Bed Granulation Amount: None Present (0%) Exposed Structure Necrotic Amount: None Present (0%) Fascia Exposed: No Fat Layer (Subcutaneous Tissue) Exposed: No Tendon Exposed: No Muscle Exposed: No Joint Exposed: No Bone Exposed: No Electronic Signature(s) Signed: 02/05/2021 6:21:03 PM By: Levan Hurst RN, BSN Entered By: Levan Hurst on 02/04/2021 12:58:41 -------------------------------------------------------------------------------- Wound Assessment Details Patient Name: Date of Service: MCNEILL DA CEYLIN, DREIBELBIS 02/04/2021 12:30 PM Medical Record Number: [id number] Patient Account Number: [id number] Date of Birth/Sex: Treating RN: 1980-06-27 (40 y.o. [name] Primary Care Allisha Harter: SITA FA LWA LLA, SHA RMIN Other Clinician: Referring Simara Rhyner: Treating Melayah Skorupski/Extender: Linton Ham SITA FA LWA LLA, SHA RMIN Weeks in Treatment: 5 Wound Status Wound Number: 6 Primary Pressure Ulcer Etiology: Wound Location: Perineum Wound Open Wounding Event: Pressure Injury Status: Date Acquired: 02/04/2021 Comorbid Cataracts, Glaucoma, Anemia, Congestive Heart Failure, Weeks Of Treatment: 0 History: Hypertension, Type II Diabetes, End Stage Renal Disease, Clustered Wound: No Neuropathy, Confinement Anxiety Photos Wound Measurements Length: (cm) 2.8 Width: (cm) 0.8 Depth: (cm) 0.2 Area: (cm) 1.759 Volume: (cm) 0.352 % Reduction in Area: % Reduction in Volume: Epithelialization: None Tunneling: No Undermining: No Wound Description Classification: Category/Stage II Wound Margin: Flat and Intact Exudate Amount: Medium Exudate Type: Serosanguineous Exudate Color: red, brown Foul Odor After Cleansing: No Slough/Fibrino No Wound Bed Granulation Amount: Large (67-100%)  Exposed Structure Granulation Quality: Pink Fascia Exposed: No Necrotic Amount: None Present (0%) Fat Layer (Subcutaneous Tissue) Exposed: Yes Tendon Exposed: No Muscle Exposed: No Joint Exposed: No Bone Exposed: No Treatment Notes Wound #6 (Perineum) Cleanser Wound Cleanser Discharge Instruction: Cleanse the wound with wound cleanser prior to applying a clean dressing using gauze sponges, not tissue or cotton balls. Peri-Wound Care Skin Prep Discharge Instruction: Use skin prep as directed Topical Primary Dressing KerraCel Ag Gelling Fiber Dressing, 2x2 in (silver alginate) Discharge Instruction: Apply silver alginate to wound bed as instructed Secondary Dressing Zetuvit Plus Silicone Border Dressing 4x4 (in/in) Discharge Instruction: Apply silicone border over primary dressing as directed. Secured With Compression Wrap Compression Stockings Environmental education officer) Signed: 02/05/2021 6:21:03 PM By: Levan Hurst RN, BSN Entered By: Levan Hurst on 02/04/2021 12:57:53 -------------------------------------------------------------------------------- Vitals Details Patient Name: Date of Service: MCNEILL DA MAANSI, WIKE 02/04/2021 12:30 PM Medical Record Number: [id number] Patient Account Number: [id number] Date of Birth/Sex: Treating RN: 31-Jan-1981 (40 y.o. [name] Primary Care Ronya Gilcrest: SITA FA LWA LLA, SHA RMIN Other Clinician: Referring Irys Nigh: Treating Mikala Podoll/Extender: Linton Ham SITA FA LWA LLA, SHA RMIN Weeks in Treatment: 5 Vital Signs Time Taken: 12:40 Temperature (F): 98.6 Pulse (bpm): 82 Respiratory Rate (breaths/min): 16 Blood Pressure (mmHg): 127/85 Capillary Blood Glucose (mg/dl): 151 Reference Range: 80 - 120 mg / dl Notes glucose per pt report Electronic Signature(s) Signed: 02/05/2021 6:21:03 PM By: Levan Hurst RN, BSN Entered By: Levan Hurst on 02/04/2021 13:01:32

## 2021-02-05 NOTE — Progress Notes (Signed)
[name] ARALI, OREGEL ([id number]) Visit Report for 02/04/2021 HPI Details Patient Name: Date of Service: [name] Barnett, [name] Barnett 02/04/2021 12:30 PM Medical Record Number: [id number] Patient Account Number: [id number] Date of Birth/Sex: Treating RN: [birth year] (40 y.o. [name] Primary Care Provider: SITA FA LWA LLA, SHA RMIN Other Clinician: Referring Provider: Treating Provider/Extender: Linton Ham SITA FA LWA LLA, SHA RMIN Weeks in Treatment: 5 History of Present Illness HPI Description: Admission 06/15/2019 This is a 40 year old woman who comes to [place] from [facility] skilled facility in [place]. I am able to follow a lot of her history on care everywhere in epic. The patient is a type II diabetic and has been on dialysis. 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 I am not exactly sure of the nature of the stroke at this present time. She has made a decent recovery and is now up walking with a walker. In August 2020 she was admitted to [facility] in [place] with L4-L5 discitis. Culture and sensitivity showed Klebsiella. She was discharged on 8 weeks of ceftazidime. In mid September she is readmitted to the hospital with a pressure ulcer on her lower sacrum and necrotizing fasciitis. She required an extensive surgical debridement. CT scan of the pelvis at 1 point showed communication with the rectum. A colostomy was recommended and placed. Culture of this area showed Klebsiella and yeast. She was discharged on an extended course of meropenem and Diflucan. [facility] [place] We do not have any of these records. I think she was discharged to another nursing home but is come to [facility] skilled facility in [facility]. She was sent down here for review of the remaining wound. By review of the pictures that the patient had from  September this is made considerable improvement. She tells me that the wound VAC was discontinued at the end of December 2020. Currently the facility is using a form of silver alginate to the wound surface. Past medical history type 2 diabetes with chronic renal failure on dialysis Tuesday Thursday and Saturday. CVA with left hemiparesis in June 2020 but she appears to be making a good recovery. Chronic diastolic heart failure, hypertension 2/19; patient's wound area slightly smaller. The area that was towards her anus has closed over. We are using Santyl covered with Hydrofera Blue. We have not been able to get her lab work apparently her veins are very difficult for phlebotomy. They are trying to get the blood work done where she dialyzes in [facility] but that requires some logistical issues. 2/26; not much change in the wound. Still a nonviable surface we have been using Santyl and Hydrofera Blue. I changed her to [facility] only. She tells me she has rigorously offloading this area she also complains of some pain 3/5; still no change here. I switch to pure Hydrofera Blue. She still complains of a lot of pain although is a big wound it seems out of proportion to what I might expect. This was initially a surgical wound secondary to necrotizing wound infection. She is on dialysis. Other than that she thinks she is offloading this fairly rigorously. She has not been systemically unwell 3/26; I switched to Iodoflex 3 weeks ago. This really seems to have helped, much better surface and slight improvement in the surface area. 4/26; the patient is still on Iodoflex she missed an appointment 2 weeks ago.  Doing slightly smaller. She is on dialysis 5/10; using Hydrofera Blue. The major improvement here is the complete lack of depth. She has islands of epithelialization 09/24/19-Patient comes in at 2 weeks, dimensions about the same, there is an [place] of epithelium at margin of wound, no slough  noted 10/15/2019 upon evaluation today patient appears to be doing well with regard to her wound. She has been tolerating the dressing changes in the sacral region without complication and at this point she seems to be healing quite nicely. I am extremely pleased with how things are progressing and the epithelial tissue seems to be spreading from the central portion as well which is also excellent. 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 We have been using Hydrofera Blue. The smaller area which has depth at 0.7 cm there is a question about whether they have been packing anything into this or just flaring Hydrofera Blue over the top 9/23; its been more than 2 months since this patient was here. She still has a small area distally which is the remanent of her stage IV wound. They have been using Hydrofera Blue. She also has an area on the left gluteal 10/22; 1 month follow-up. I think the original sacral wound is closed however the patient has 2 areas distally 1 over the coccyx a small pinpoint area and the other into the gluteal cleft. Neither 1 of these looks particularly infected. I thought they might be connected but they were not. 11/15; the original sacral wound is closed patient only has 1 remaining area. 1 of these is in the gluteal cleft just below the coccyx. The area over the coccyx from last time I think is all so closed 12/31/2020; this is a patient that we saw for a pressure ulcer over her lower sacrum and coccyx for a long period of time in 2021. She eventually healed over. She is at [facility] skilled facility largely secondary to a remote CVA she had. She is also end-stage renal disease on dialysis secondary to type 2 diabetes. She tells [place] that since about mid June she has had an area more distally than the original sacrum/coccyx wound it looks as though they are putting silver alginate in this area. She is limited in her  ability to offload this I think spending most of her time in a wheelchair although she can stand to transfer. She has an ostomy During her original stay here in 2021 I also list that she had a more distal area into her perineum that closed over well before the more proximal coccyx/sacral wound. She had a CT scan of the abdomen and pelvis on 12/01/2020. This did note first chronic sacral decubitus along the right aspect of her distal sacrum and coccyx. There was not felt to be bone destruction she has chronic changes of bilateral sacroiliitis and chronic endplate destruction and sclerosis at L4-L5 secondary to previous osteomyelitis however no new findings were felt to be seen in terms of bone problems in this area 01/14/2021; 2-week follow-up. Her wound is smaller essentially in the coccyx area. We have been using silver collagen. 10/5; 3-week follow-up. The patient returns with her area in the coccyx healed however distally in the perineum there is a fairly long stage II area. I am assuming this patient's wound is a pressure related area. Although this would not be a usual pressure area for a normal situation her immobility secondary to remote CVA,  dialysis etc. make her at risk for pressure areas in an otherwise unusual setting. The patient is fairly upset stating that we were told about this last time. I do not have this in my records. Her original wound was small and just about healed at that point and this visit was arranged in follow-up. If there was a new wound more distally into her perineum we did not look at it Electronic Signature(s) Signed: 02/05/2021 5:45:05 PM By: Linton Ham MD Entered By: Linton Ham on 02/04/2021 13:24:00 -------------------------------------------------------------------------------- Physical Exam Details Patient Name: Date of Service: [name] DA MEKKA, BLUMENBERG 02/04/2021 12:30 PM Medical Record Number: [id number] Patient Account Number: [id number] Date of  Birth/Sex: Treating RN: 12/19/1980 (40 y.o. [name] Primary Care Provider: SITA FA LWA LLA, SHA RMIN Other Clinician: Referring Provider: Treating Provider/Extender: Linton Ham SITA FA LWA LLA, SHA RMIN Weeks in Treatment: 5 Constitutional Sitting or standing Blood Pressure is within target range for patient.. Pulse regular and within target range for patient.Marland Kitchen Respirations regular, non-labored and within target range.. Temperature is normal and within the target range for the patient.Marland Kitchen Appears in no distress. Notes Wound exam; the area that was in her lower sacrum/coccyx is totally epithelialized and healed. HOWEVER she has a new area more distally into her perineum. This is a fairly large wound relatively speaking and with some depth. There is no need for debridement there is no subcutaneous involvement and not overtly infected Electronic Signature(s) Signed: 02/05/2021 5:45:05 PM By: Linton Ham MD Entered By: Linton Ham on 02/04/2021 13:25:46 -------------------------------------------------------------------------------- Physician Orders Details Patient Name: Date of Service: [name] DA HELLEN, BUPP 02/04/2021 12:30 PM Medical Record Number: [id number] Patient Account Number: [id number] Date of Birth/Sex: Treating RN: 09/07/1980 (40 y.o. [name] Primary Care Provider: SITA FA LWA LLA, SHA RMIN Other Clinician: Referring Provider: Treating Provider/Extender: Linton Ham SITA FA LWA LLA, SHA RMIN Weeks in Treatment: 5 Verbal / Phone Orders: No Diagnosis Coding ICD-10 Coding Code Description [id number] Pressure ulcer of right buttock, stage 2 Follow-up Appointments Return appointment in 1 month. - with Dr. Dellia Nims Bathing/ Shower/ Hygiene May shower with protection but do not get wound dressing(s) wet. Off-Loading Turn and reposition every 2 hours Wound Treatment Wound #6 - Perineum Cleanser: Wound Cleanser Every Other Day/30 Days Discharge  Instructions: Cleanse the wound with wound cleanser prior to applying a clean dressing using gauze sponges, not tissue or cotton balls. Peri-Wound Care: Skin Prep Every Other Day/30 Days Discharge Instructions: Use skin prep as directed Prim Dressing: KerraCel Ag Gelling Fiber Dressing, 2x2 in (silver alginate) Every Other Day/30 Days ary Discharge Instructions: Apply silver alginate to wound bed as instructed Secondary Dressing: Zetuvit Plus Silicone Border Dressing 4x4 (in/in) Every Other Day/30 Days Discharge Instructions: Apply silicone border over primary dressing as directed. Electronic Signature(s) Signed: 02/05/2021 5:45:05 PM By: Linton Ham MD Signed: 02/05/2021 6:21:03 PM By: Levan Hurst RN, BSN Entered By: Levan Hurst on 02/04/2021 13:16:18 -------------------------------------------------------------------------------- Problem List Details Patient Name: Date of Service: [name] DA ANTINIQUE, PRZYBYSZEWSKI 02/04/2021 12:30 PM Medical Record Number: [id number] Patient Account Number: [id number] Date of Birth/Sex: Treating RN: February 23, 1981 (40 y.o. [name] Primary Care Provider: SITA FA LWA LLA, SHA RMIN Other Clinician: Referring Provider: Treating Provider/Extender: Linton Ham SITA FA LWA LLA, SHA RMIN Weeks in Treatment: 5 Active Problems ICD-10 Encounter Code Description Active Date MDM Diagnosis L89.152 Pressure ulcer of sacral region, stage 2 02/04/2021 No Yes Inactive Problems ICD-10 Code Description Active Date Inactive Date L89.312 Pressure ulcer of  right buttock, stage 2 12/31/2020 12/31/2020 Resolved Problems Electronic Signature(s) Signed: 02/05/2021 5:45:05 PM By: Linton Ham MD Entered By: Linton Ham on 02/04/2021 13:21:09 -------------------------------------------------------------------------------- Progress Note Details Patient Name: Date of Service: [name] DA MINJI, PALL 02/04/2021 12:30 PM Medical Record Number: [id number] Patient  Account Number: [id number] Date of Birth/Sex: Treating RN: Jul 13, 1980 (40 y.o. [name] Primary Care Provider: SITA FA LWA LLA, SHA RMIN Other Clinician: Referring Provider: Treating Provider/Extender: Linton Ham SITA FA LWA LLA, SHA RMIN Weeks in Treatment: 5 Subjective History of Present Illness (HPI) Admission 06/15/2019 This is a 40 year old woman who comes to [place] from [facility] skilled facility in [facility]. I am able to follow a lot of her history on care everywhere in epic. The patient is a type II diabetic and has been on dialysis. 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 I am not exactly sure of the nature of the stroke at this present time. She has made a decent recovery and is now up walking with a walker. In August 2020 she was admitted to [facility] in [place] with L4-L5 discitis. Culture and sensitivity showed Klebsiella. She was discharged on 8 weeks of ceftazidime. In mid September she is readmitted to the hospital with a pressure ulcer on her lower sacrum and necrotizing fasciitis. She required an extensive surgical debridement. CT scan of the pelvis at 1 point showed communication with the rectum. A colostomy was recommended and placed. Culture of this area showed Klebsiella and yeast. She was discharged on an extended course of meropenem and Diflucan. [facility] [place] We do not have any of these records. I think she was discharged to another nursing home but is come to [facility] skilled facility in [facility]. She was sent down here for review of the remaining wound. By review of the pictures that the patient had from September this is made considerable improvement. She tells me that the wound VAC was discontinued at the end of December 2020. Currently the facility is using a form of silver alginate to the  wound surface. Past medical history type 2 diabetes with chronic renal failure on dialysis Tuesday Thursday and Saturday. CVA with left hemiparesis in June 2020 but she appears to be making a good recovery. Chronic diastolic heart failure, hypertension 2/19; patient's wound area slightly smaller. The area that was towards her anus has closed over. We are using Santyl covered with Hydrofera Blue. We have not been able to get her lab work apparently her veins are very difficult for phlebotomy. They are trying to get the blood work done where she dialyzes in [facility] but that requires some logistical issues. 2/26; not much change in the wound. Still a nonviable surface we have been using Santyl and Hydrofera Blue. I changed her to [facility] only. She tells me she has rigorously offloading this area she also complains of some pain 3/5; still no change here. I switch to pure Hydrofera Blue. She still complains of a lot of pain although is a big wound it seems out of proportion to what I might expect. This was initially a surgical wound secondary to necrotizing wound infection. She is on dialysis. Other than that she thinks she is offloading this fairly rigorously. She has not been systemically unwell 3/26; I switched to Iodoflex 3 weeks ago. This really seems to have helped, much better surface and  slight improvement in the surface area. 4/26; the patient is still on Iodoflex she missed an appointment 2 weeks ago. Doing slightly smaller. She is on dialysis 5/10; using Hydrofera Blue. The major improvement here is the complete lack of depth. She has islands of epithelialization 09/24/19-Patient comes in at 2 weeks, dimensions about the same, there is an [place] of epithelium at margin of wound, no slough noted 10/15/2019 upon evaluation today patient appears to be doing well with regard to her wound. She has been tolerating the dressing changes in the sacral region without complication and at  this point she seems to be healing quite nicely. I am extremely pleased with how things are progressing and the epithelial tissue seems to be spreading from the central portion as well which is also excellent. 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 We have been using Hydrofera Blue. The smaller area which has depth at 0.7 cm there is a question about whether they have been packing anything into this or just flaring Hydrofera Blue over the top 9/23; its been more than 2 months since this patient was here. She still has a small area distally which is the remanent of her stage IV wound. They have been using Hydrofera Blue. She also has an area on the left gluteal 10/22; 1 month follow-up. I think the original sacral wound is closed however the patient has 2 areas distally 1 over the coccyx a small pinpoint area and the other into the gluteal cleft. Neither 1 of these looks particularly infected. I thought they might be connected but they were not. 11/15; the original sacral wound is closed patient only has 1 remaining area. 1 of these is in the gluteal cleft just below the coccyx. The area over the coccyx from last time I think is all so closed 12/31/2020; this is a patient that we saw for a pressure ulcer over her lower sacrum and coccyx for a long period of time in 2021. She eventually healed over. She is at [facility] skilled facility largely secondary to a remote CVA she had. She is also end-stage renal disease on dialysis secondary to type 2 diabetes. She tells [place] that since about mid June she has had an area more distally than the original sacrum/coccyx wound it looks as though they are putting silver alginate in this area. She is limited in her ability to offload this I think spending most of her time in a wheelchair although she can stand to transfer. She has an ostomy During her original stay here in 2021 I also list that she had  a more distal area into her perineum that closed over well before the more proximal coccyx/sacral wound. She had a CT scan of the abdomen and pelvis on 12/01/2020. This did note first chronic sacral decubitus along the right aspect of her distal sacrum and coccyx. There was not felt to be bone destruction she has chronic changes of bilateral sacroiliitis and chronic endplate destruction and sclerosis at L4-L5 secondary to previous osteomyelitis however no new findings were felt to be seen in terms of bone problems in this area 01/14/2021; 2-week follow-up. Her wound is smaller essentially in the coccyx area. We have been using silver collagen. 10/5; 3-week follow-up. The patient returns with her area in the coccyx healed however distally in the perineum there is a fairly long stage II area. I am assuming this patient's wound is a pressure related  area. Although this would not be a usual pressure area for a normal situation her immobility secondary to remote CVA, dialysis etc. make her at risk for pressure areas in an otherwise unusual setting. The patient is fairly upset stating that we were told about this last time. I do not have this in my records. Her original wound was small and just about healed at that point and this visit was arranged in follow-up. If there was a new wound more distally into her perineum we did not look at it Objective Constitutional Sitting or standing Blood Pressure is within target range for patient.. Pulse regular and within target range for patient.Marland Kitchen Respirations regular, non-labored and within target range.. Temperature is normal and within the target range for the patient.Marland Kitchen Appears in no distress. Vitals Time Taken: 12:40 PM, Temperature: 98.6 F, Pulse: 82 bpm, Respiratory Rate: 16 breaths/min, Blood Pressure: 127/85 mmHg, Capillary Blood Glucose: 151 mg/dl. General Notes: glucose per pt report General Notes: Wound exam; the area that was in her lower sacrum/coccyx is  totally epithelialized and healed. HOWEVER she has a new area more distally into her perineum. This is a fairly large wound relatively speaking and with some depth. There is no need for debridement there is no subcutaneous involvement and not overtly infected Integumentary (Hair, Skin) Wound #5 status is Open. Original cause of wound was Pressure Injury. The date acquired was: 10/22/2020. The wound has been in treatment 5 weeks. The wound is located on the Right Gluteus. The wound measures 0cm length x 0cm width x 0cm depth; 0cm^2 area and 0cm^3 volume. There is no tunneling or undermining noted. There is a none present amount of drainage noted. The wound margin is distinct with the outline attached to the wound base. There is no granulation within the wound bed. There is no necrotic tissue within the wound bed. Wound #6 status is Open. Original cause of wound was Pressure Injury. The date acquired was: 02/04/2021. The wound is located on the Perineum. The wound measures 2.8cm length x 0.8cm width x 0.2cm depth; 1.759cm^2 area and 0.352cm^3 volume. There is Fat Layer (Subcutaneous Tissue) exposed. There is no tunneling or undermining noted. There is a medium amount of serosanguineous drainage noted. The wound margin is flat and intact. There is large (67-100%) pink granulation within the wound bed. There is no necrotic tissue within the wound bed. Assessment Active Problems ICD-10 Pressure ulcer of sacral region, stage 2 Plan Follow-up Appointments: Return appointment in 1 month. - with Dr. Arcola Jansky Shower/ Hygiene: May shower with protection but do not get wound dressing(s) wet. Off-Loading: Turn and reposition every 2 hours WOUND #6: - Perineum Wound Laterality: Cleanser: Wound Cleanser Every Other Day/30 Days Discharge Instructions: Cleanse the wound with wound cleanser prior to applying a clean dressing using gauze sponges, not tissue or cotton balls. Peri-Wound Care: Skin Prep  Every Other Day/30 Days Discharge Instructions: Use skin prep as directed Prim Dressing: KerraCel Ag Gelling Fiber Dressing, 2x2 in (silver alginate) Every Other Day/30 Days ary Discharge Instructions: Apply silver alginate to wound bed as instructed Secondary Dressing: Zetuvit Plus Silicone Border Dressing 4x4 (in/in) Every Other Day/30 Days Discharge Instructions: Apply silicone border over primary dressing as directed. 1. New wound by our records as noted. This is more distal to the coccyx and the perineum. This is not a usual pressure area 2. Nevertheless because of the patient's immobility and long periods of time in her wheelchair i.e. 3 times a week at dialysis she  is certainly at risk for pressure area breakdown and atypical areas 3. She was quite upset today stating that she told them myself and the nurse about this last time. I have no recollection of this, I did not comment on it its not in our nurses records. 4. We will use silver alginate on this as well. We need to find a way to offload this more aggressively especially when she is up in the wheelchair Electronic Signature(s) Signed: 02/05/2021 5:45:05 PM By: Linton Ham MD Entered By: Linton Ham on 02/04/2021 13:27:24 -------------------------------------------------------------------------------- SuperBill Details Patient Name: Date of Service: [name] DA ULONDA, POLIN 02/04/2021 Medical Record Number: [id number] Patient Account Number: [id number] Date of Birth/Sex: Treating RN: May 07, 1980 (40 y.o. [name] Primary Care Provider: SITA FA LWA LLA, SHA RMIN Other Clinician: Referring Provider: Treating Provider/Extender: Linton Ham SITA FA LWA LLA, SHA RMIN Weeks in Treatment: 5 Diagnosis Coding ICD-10 Codes Code Description [id number] Pressure ulcer of sacral region, stage 2 Facility Procedures CPT4 Code: [id number] Description: 13 - WOUND CARE VISIT-LEV 3 EST PT 1 Modifier: Quantity: Physician  Procedures : CPT4 Code Description Modifier [id number] - WC PHYS LEVEL 3 - EST PT ICD-10 Diagnosis Description L89.152 Pressure ulcer of sacral region, stage 2 Quantity: 1 Electronic Signature(s) Signed: 02/05/2021 5:45:05 PM By: Linton Ham MD Signed: 02/05/2021 6:21:03 PM By: Levan Hurst RN, BSN Entered By: Levan Hurst on 02/04/2021 13:38:45

## 2021-02-11 LAB — TSH: TSH: 0.06 — AB (ref 0.41–5.90)

## 2021-02-12 ENCOUNTER — Ambulatory Visit: Payer: Medicare Other | Admitting: Nurse Practitioner

## 2021-02-16 ENCOUNTER — Ambulatory Visit (INDEPENDENT_AMBULATORY_CARE_PROVIDER_SITE_OTHER): Payer: Medicare Other | Admitting: Physician Assistant

## 2021-02-16 ENCOUNTER — Encounter: Payer: Self-pay | Admitting: Physician Assistant

## 2021-02-16 ENCOUNTER — Other Ambulatory Visit: Payer: Self-pay

## 2021-02-18 ENCOUNTER — Ambulatory Visit (INDEPENDENT_AMBULATORY_CARE_PROVIDER_SITE_OTHER): Payer: Medicare Other | Admitting: Specialist

## 2021-02-18 ENCOUNTER — Encounter: Payer: Self-pay | Admitting: Specialist

## 2021-02-18 ENCOUNTER — Ambulatory Visit: Payer: Self-pay

## 2021-02-18 ENCOUNTER — Other Ambulatory Visit: Payer: Self-pay

## 2021-02-18 VITALS — BP 111/76 | HR 84 | Ht 65.0 in | Wt 187.0 lb

## 2021-02-18 DIAGNOSIS — M4726 Other spondylosis with radiculopathy, lumbar region: Secondary | ICD-10-CM | POA: Diagnosis not present

## 2021-02-18 DIAGNOSIS — I2510 Atherosclerotic heart disease of native coronary artery without angina pectoris: Secondary | ICD-10-CM | POA: Diagnosis not present

## 2021-02-18 DIAGNOSIS — M4649 Discitis, unspecified, multiple sites in spine: Secondary | ICD-10-CM

## 2021-02-18 DIAGNOSIS — I69354 Hemiplegia and hemiparesis following cerebral infarction affecting left non-dominant side: Secondary | ICD-10-CM

## 2021-02-18 DIAGNOSIS — M5136 Other intervertebral disc degeneration, lumbar region: Secondary | ICD-10-CM | POA: Diagnosis not present

## 2021-02-18 DIAGNOSIS — M21372 Foot drop, left foot: Secondary | ICD-10-CM

## 2021-02-18 NOTE — Patient Instructions (Addendum)
Lumbar discitis is stable and there is no suggestion of acute worsening of her chronic discitis osteomyelitis changes at the L4-5 level.  I recommend that she be evaluated by the Rehabilitation specialists at [facility], Dr. Anselm Lis for assessment of her CVA and hemiparesis and determination if further rehabilitation may be able to improve her recovery of  Independence and consideration of return to independent living circumstance.  Return in 3 months for follow up.

## 2021-02-18 NOTE — Progress Notes (Signed)
Office Visit Note   Patient: [name] Barnett           Date of Birth: 1980/08/09           MRN: [id number] Visit Date: 02/18/2021              Requested by: Julianne Handler, DO [address] [place],  [place] [address] PCP: Julianne Handler, DO   Assessment & Plan: Visit Diagnoses:  1. Other spondylosis with radiculopathy, lumbar region   2. Degenerative disc disease, lumbar   3. 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  I recommend that she be evaluated by the Rehabilitation specialists at [facility], Dr. Anselm Lis for assessment of her CVA and hemiparesis and determination if further rehabilitation may be able to improve her recovery of  Independence and consideration of return to independent living circumstance.  Return in 3 months for follow up.   Follow-Up Instructions: No follow-ups on file.   Orders:  Orders Placed This Encounter  Procedures   XR Lumbar Spine Complete W/Bend   Ambulatory referral to Physical Medicine Rehab   No orders of the defined types were placed in this encounter.     Procedures: No procedures performed   Clinical Data: No additional findings.   Subjective: Chief Complaint  Patient presents with   f/u post EMG/NCS for LE weakness, left >right    HPI  Review of Systems   Objective: Vital Signs: BP 111/76 (BP Location: Left Arm, Patient Position: Sitting, Cuff Size: Normal)   Pulse 84   Ht '5\' 5"'$  (1.651 m)   Wt 187 lb (84.8 kg)   BMI 31.12 kg/m   Physical Exam  Ortho Exam  Specialty Comments:  No specialty comments available.  Imaging: No results found.   PMFS History: Patient Active Problem List   Diagnosis Date Noted   BV (bacterial vaginosis) 04/02/2020   Vaginal itching 04/02/2020   Vaginal odor 04/02/2020   Vaginal discharge 04/02/2020   Sepsis secondary  to UTI ([place]) 01/13/2020   Acute respiratory failure with hypoxia ([place]) 01/13/2020   Right leg swelling 01/13/2020   Sacral decubitus ulcer 01/13/2020   Anemia due to end stage renal disease ([place]) 01/13/2020   Essential hypertension 01/13/2020   Hyperlipidemia 01/13/2020   Type 2 diabetes mellitus ([place]) 01/13/2020   Past Medical History:  Diagnosis Date   Diabetes mellitus without complication ([place])    Hyperlipidemia    Hypertension    Renal disorder    Stroke ([place])    Vitamin D deficiency     Family History  Problem Relation Age of Onset   Cancer Paternal Grandmother    Heart disease Father    Hypertension Father    Diabetes Father    Glaucoma Father    Heart disease Mother    Hypertension Mother    Diabetes Mother    Diabetes Sister        borderline   Hypertension Sister     Past Surgical History:  Procedure Laterality Date   cardiac stents     CATARACT EXTRACTION W/PHACO Left 08/29/2020   Procedure: CATARACT EXTRACTION PHACO AND INTRAOCULAR LENS PLACEMENT LEFT EYE;  Surgeon: Baruch Goldmann, MD;  Location: [facility];  Service: Ophthalmology;  Laterality: Left;  left CDE=29.30   CATARACT EXTRACTION W/PHACO Right 11/28/2020   Procedure: CATARACT EXTRACTION PHACO AND INTRAOCULAR LENS  PLACEMENT (IOC);  Surgeon: Baruch Goldmann, MD;  Location: [facility];  Service: Ophthalmology;  Laterality: Right;  CDE 4.35   COLOSTOMY     TUBAL LIGATION     wound on buttocks     Social History   Occupational History   Not on file  Tobacco Use   Smoking status: Never   Smokeless tobacco: Never  Vaping Use   Vaping Use: Never used  Substance and Sexual Activity   Alcohol use: Never   Drug use: Never   Sexual activity: Not Currently    Birth control/protection: Surgical    Comment: tubal

## 2021-02-23 ENCOUNTER — Ambulatory Visit: Payer: Medicare Other | Admitting: Nurse Practitioner

## 2021-02-23 ENCOUNTER — Ambulatory Visit (INDEPENDENT_AMBULATORY_CARE_PROVIDER_SITE_OTHER): Payer: Medicare Other | Admitting: Nurse Practitioner

## 2021-02-23 ENCOUNTER — Encounter: Payer: Self-pay | Admitting: Nurse Practitioner

## 2021-02-23 VITALS — BP 126/78 | Ht 65.0 in

## 2021-02-23 DIAGNOSIS — R7989 Other specified abnormal findings of blood chemistry: Secondary | ICD-10-CM | POA: Diagnosis not present

## 2021-02-23 DIAGNOSIS — E0590 Thyrotoxicosis, unspecified without thyrotoxic crisis or storm: Secondary | ICD-10-CM

## 2021-02-23 MED ORDER — PREDNISONE 5 MG PO TABS: 5.0000 mg | ORAL_TABLET | Freq: Every day | ORAL | 0 refills | Status: AC

## 2021-02-23 NOTE — Progress Notes (Signed)
02/23/2021     Endocrinology Follow Up Note    Subjective:    Patient ID: [name] Barnett, female    DOB: 1980/09/11, PCP Sitafalwalla, [place], DO.   Past Medical History:  Diagnosis Date   Diabetes mellitus without complication ([place])    Hyperlipidemia    Hypertension    Renal disorder    Stroke [facility])    Vitamin D deficiency     Past Surgical History:  Procedure Laterality Date   cardiac stents     CATARACT EXTRACTION W/PHACO Left 08/29/2020   Procedure: CATARACT EXTRACTION PHACO AND INTRAOCULAR LENS PLACEMENT LEFT EYE;  Surgeon: Baruch Goldmann, MD;  Location: [facility];  Service: Ophthalmology;  Laterality: Left;  left CDE=29.30   CATARACT EXTRACTION W/PHACO Right 11/28/2020   Procedure: CATARACT EXTRACTION PHACO AND INTRAOCULAR LENS PLACEMENT (IOC);  Surgeon: Baruch Goldmann, MD;  Location: [facility];  Service: Ophthalmology;  Laterality: Right;  CDE 4.35   COLOSTOMY     TUBAL LIGATION     wound on buttocks      Social History   Socioeconomic History   Marital status: Legally Separated    Spouse name: Not on file   Number of children: Not on file   Years of education: Not on file   Highest education level: Not on file  Occupational History   Not on file  Tobacco Use   Smoking status: Never   Smokeless tobacco: Never  Vaping Use   Vaping Use: Never used  Substance and Sexual Activity   Alcohol use: Never   Drug use: Never   Sexual activity: Not Currently    Birth control/protection: Surgical    Comment: tubal  Other Topics Concern   Not on file  Social History Narrative   Not on file   Social Determinants of Health   Financial Resource Strain: Low Risk    Difficulty of Paying Living Expenses: Not very hard  Food Insecurity: No Food Insecurity   Worried About Running Out of Food in the Last Year: Never true   [place] in the Last Year: Never true  Transportation Needs: No Transportation Needs   Lack of Transportation (Medical): No    Lack of Transportation (Non-Medical): No  Physical Activity: Insufficiently Active   Days of Exercise per Week: 3 days   Minutes of Exercise per Session: 30 min  Stress: No Stress Concern Present   Feeling of Stress : Not at all  Social Connections: Moderately Isolated   Frequency of Communication with Friends and Family: More than three times a week   Frequency of Social Gatherings with Friends and Family: Never   Attends Religious Services: More than 4 times per year   Active Member of [employer] or Organizations: No   Attends Archivist Meetings: Never   Marital Status: Separated    Family History  Problem Relation Age of Onset   Cancer Paternal Grandmother    Heart disease Father    Hypertension Father    Diabetes Father    Glaucoma Father    Heart disease Mother    Hypertension Mother    Diabetes Mother    Diabetes Sister        borderline   Hypertension Sister     Outpatient Encounter Medications as of 02/23/2021  Medication Sig   acetaminophen (TYLENOL) 325 MG tablet Take 650 mg by mouth every 6 (six) hours as needed.   amLODipine (NORVASC) 5 MG tablet Take 5 mg by mouth  See admin instructions. Sun, Mon, Wed and Fri   ascorbic acid (VITAMIN C) 500 MG tablet Take 500 mg by mouth 2 (two) times daily.   aspirin EC 81 MG tablet Take 81 mg by mouth daily. Swallow whole.   atenolol (TENORMIN) 25 MG tablet Take 0.5 tablets (12.5 mg total) by mouth daily. (Patient taking differently: Take 12.5 mg by mouth See admin instructions. Every Sun, Mon, Wed and Fri)   atorvastatin (LIPITOR) 80 MG tablet Take 1 tablet by mouth at bedtime.   b complex-vitamin c-folic acid (NEPHRO-VITE) 0.8 MG TABS tablet Take 1 tablet by mouth daily.   Biotin 5 MG TABS Take 5 mg by mouth in the morning and at bedtime.   busPIRone (BUSPAR) 5 MG tablet Take 5 mg by mouth 2 (two) times daily.   calcium carbonate (TUMS - DOSED IN MG ELEMENTAL CALCIUM) 500 MG chewable tablet Chew 1,000 mg by mouth 3  (three) times daily. Before meals   cetaphil (CETAPHIL) lotion Apply 1 application topically 2 (two) times daily as needed for dry skin.   Cholecalciferol (VITAMIN D) 50 MCG (2000 UT) CAPS Take 2,000 Units by mouth daily.   dicyclomine (BENTYL) 10 MG capsule Take 10 mg by mouth 3 (three) times daily before meals.   HUMALOG 100 UNIT/ML injection Inject 5 Units into the skin 3 (three) times daily before meals.   LANTUS 100 UNIT/ML injection Inject 10 Units into the skin in the morning.   Lidocaine 4 % PTCH Apply 1 application topically daily at 6 (six) AM.   lidocaine-prilocaine (EMLA) cream Apply 1 application topically daily as needed. Apply to HD graft site right arm topically one every tues, thurs, sat for pain.   melatonin 3 MG TABS tablet Take 3 mg by mouth at bedtime.   Multiple Vitamins-Minerals (CERTAGEN PO) Take 1 tablet by mouth in the morning.   nystatin cream (MYCOSTATIN) Apply topically.   ondansetron (ZOFRAN ODT) 4 MG disintegrating tablet Take 1 tablet (4 mg total) by mouth every 8 (eight) hours as needed for nausea or vomiting.   pantoprazole (PROTONIX) 40 MG tablet Take 40 mg by mouth in the morning.   polyethylene glycol (MIRALAX / GLYCOLAX) 17 g packet Take 17 g by mouth daily.   predniSONE (DELTASONE) 5 MG tablet Take 1 tablet (5 mg total) by mouth daily with breakfast.   rOPINIRole (REQUIP) 0.5 MG tablet Take 0.5 mg by mouth at bedtime.   rOPINIRole (REQUIP) 2 MG tablet Take 2 mg by mouth at bedtime.   sennosides-docusate sodium (SENOKOT-S) 8.6-50 MG tablet Take 2 tablets by mouth daily.   tamsulosin (FLOMAX) 0.4 MG CAPS capsule Take 1 capsule (0.4 mg total) by mouth daily.   thiamine 100 MG tablet Take 100 mg by mouth daily.   tiZANidine (ZANAFLEX) 4 MG tablet Take 4 mg by mouth at bedtime.   traMADol (ULTRAM) 50 MG tablet Take 100 mg by mouth every 6 (six) hours as needed.   HYDROcodone-acetaminophen (NORCO/VICODIN) 5-325 MG tablet Take 1 tablet by mouth 2 (two) times  daily as needed. (Patient not taking: Reported on 02/23/2021)   nepafenac (NEVANAC) 0.1 % ophthalmic suspension Place 1 drop into the right eye daily. (Patient not taking: Reported on 02/23/2021)   oxyCODONE-acetaminophen (PERCOCET) 5-325 MG tablet Take 1 tablet by mouth every 4 (four) hours as needed. (Patient not taking: Reported on 02/23/2021)   prednisoLONE acetate (PRED FORTE) 1 % ophthalmic suspension Place 1 drop into the right eye 2 (two) times daily. (Patient not  taking: Reported on 02/23/2021)   No facility-administered encounter medications on file as of 02/23/2021.    ALLERGIES: Allergies  Allergen Reactions   Corn-Containing Products     Per MAR   Lisinopril     Per MAR   Morphine And Related     Per MAR    VACCINATION STATUS:  There is no immunization history on file for this patient.   HPI  [name] Barnett is 40 y.o. female who presents today with a medical history as above. she is being seen in follow up after being seen in consultation for hyperthyroidism requested by Sitafalwalla, Sharmin, DO.  she has been dealing with symptoms of intermittent palpitations, tremors, insomnia, and intermittent diarrhea for a few months now. These symptoms are progressively worsening and troubling to her.  her most recent thyroid labs revealed suppressed TSH of 0.198, T4 of 9.4, T3 uptake of 46, and Free Thyroxine Index of 4.3 on 08/14/20. she denies dysphagia, choking, shortness of breath, no recent voice change.    she reports family history of thyroid dysfunction in her mother (unsure of what kind), but denies family hx of thyroid cancer. she does have personal history of goiter. she is not on any anti-thyroid medications nor on any thyroid hormone supplements.  she is willing to proceed with appropriate work up and therapy for thyrotoxicosis.  After reviewing her med list from the nursing home, it is noted that she is on Biotin supplement daily.  She is also on Atenolol daily  which could be masking her symptoms.   Review of systems  Constitutional: + Minimally fluctuating body weight,  current Body mass index is 31.12 kg/m. , no fatigue, no subjective hyperthermia, no subjective hypothermia Eyes: no blurry vision, no xerophthalmia ENT: no sore throat, no nodules palpated in throat, no dysphagia/odynophagia, no hoarseness Cardiovascular: no chest pain, no shortness of breath, no palpitations, no leg swelling Respiratory: no cough, no shortness of breath Gastrointestinal: no nausea/vomiting/diarrhea Musculoskeletal: no muscle/joint aches Skin: no rashes, no hyperemia Neurological: no tremors, no numbness, no tingling, no dizziness Psychiatric: no depression, no anxiety, reports increased sexual arousal   Objective:    BP 126/78   Ht 5\' 5"  (1.651 m)   BMI 31.12 kg/m   Wt Readings from Last 3 Encounters:  02/18/21 187 lb (84.8 kg)  12/24/20 187 lb (84.8 kg)  12/19/20 187 lb (84.8 kg)     BP Readings from Last 3 Encounters:  02/23/21 126/78  02/18/21 111/76  12/26/20 132/87                       Physical Exam- Limited  Constitutional:  Body mass index is 31.12 kg/m. , not in acute distress, normal state of mind Eyes:  EOMI, no exophthalmos Neck: Supple Cardiovascular: RRR, no murmurs, rubs, or gallops, 2+ pitting edema to BLE Respiratory: Adequate breathing efforts, no crackles, rales, rhonchi, or wheezing Musculoskeletal: no gross deformities, strength intact in all four extremities, no gross restriction of joint movements, in wc due to previous CVA Skin:  no rashes, no hyperemia Neurological: no tremor with outstretched hands    CMP     Component Value Date/Time   NA 139 12/01/2020 1753   K 4.6 12/01/2020 1753   CL 97 (L) 12/01/2020 1753   CO2 22 12/01/2020 1753   GLUCOSE 104 (H) 12/01/2020 1753   BUN 58 (H) 12/01/2020 1753   CREATININE 8.68 (H) 12/01/2020 1753   CALCIUM 9.1 12/01/2020 1753  PROT 9.3 (H) 12/01/2020 1753    ALBUMIN 4.4 12/01/2020 1753   AST 8 (L) 12/01/2020 1753   ALT 17 12/01/2020 1753   ALKPHOS 148 (H) 12/01/2020 1753   BILITOT 0.9 12/01/2020 1753   GFRNONAA 5 (L) 12/01/2020 1753   GFRAA 7 (L) 01/16/2020 0617     CBC    Component Value Date/Time   WBC 14.6 (H) 12/01/2020 1753   RBC 4.09 12/01/2020 1753   HGB 12.2 12/01/2020 1753   HCT 39.0 12/01/2020 1753   PLT 396 12/01/2020 1753   MCV 95.4 12/01/2020 1753   MCH 29.8 12/01/2020 1753   MCHC 31.3 12/01/2020 1753   RDW 14.6 12/01/2020 1753   LYMPHSABS 0.9 12/01/2020 1753   MONOABS 0.5 12/01/2020 1753   EOSABS 0.0 12/01/2020 1753   BASOSABS 0.0 12/01/2020 1753     Diabetic Labs (most recent): Lab Results  Component Value Date   HGBA1C 7.1 (H) 01/13/2020   HGBA1C 6.2 (H) 02/08/2019    Lipid Panel  No results found for: CHOL, TRIG, HDL, CHOLHDL, VLDL, LDLCALC, LDLDIRECT, LABVLDL   Lab Results  Component Value Date   TSH 0.61 09/02/2020   TSH 0.20 (A) 08/14/2020    09/02/20 0000   Result status: Final  Resulting lab: [facility]  Reference range: 0.41 - 5.90  Value: 0.61  Comment: T4,Free 1.49, Anti-Thyroglobulin Antibodies <1.0   Uptake and Scan from 09/18/20 CLINICAL DATA:  Difficulty sleeping, hair loss, increased thirst, heart palpitations, fatigue, weakness, edema suppressed TSH, suspected hyperthyroidism   EXAM: THYROID SCAN AND UPTAKE - 4 AND 24 HOURS   TECHNIQUE: Following oral administration of I-123 capsule, anterior planar imaging was acquired at 24 hours. Thyroid uptake was calculated with a thyroid probe at 4-6 hours and 24 hours.   RADIOPHARMACEUTICALS:  310 uCi I-123 sodium iodide p.o.   COMPARISON:  None   FINDINGS: Homogeneous tracer distribution in both thyroid lobes.   No focal areas of increased or decreased tracer localization.   4 hour I-123 uptake = 4.3% (normal 5-20%)   24 hour I-123 uptake = 10.5% (normal 10-30%)   IMPRESSION: Normal thyroid scan.   Low normal 24 hour radio  iodine uptake.     Electronically Signed   By: Lavonia Dana M.D.   On: 09/18/2020 11:03    Assessment & Plan:   1. Hyperthyroidism  she is being seen at a kind request of [place], [place], DO.  Her repeat thyroid function tests show evidence of recurrence of acute thyroiditis given her suppressed TSH and elevated FT4 levels.  She denies any specific symptoms of over-active thyroid (possibly due to her beta-blocker use).   She did have positive TPO antibody testing which increases her risk of developing Hashimoto's or Graves disease, however given her negative uptake and scan back in May 2022, this suggests thyroiditis as well.  I did initiate course of steroids Prednisone 5 mg po daily x 30 days, then will have her return with repeat labs.  She states she may be moving to [place], [place] between now and then.  I discussed the importance of getting established with endocrinologist in that area ASAP and we can send our records there for ongoing evaluation and treatment.   -Patient is advised to maintain close follow up with [place], [place], DO for primary care needs.      I spent 30 minutes in the care of the patient today including review of labs from Thyroid Function, CMP, and other relevant labs ; imaging/biopsy records (current  and previous including abstractions from other facilities); face-to-face time discussing  her lab results and symptoms, medications doses, her options of short and long term treatment based on the latest standards of care / guidelines;   and documenting the encounter.  [place]  participated in the discussions, expressed understanding, and voiced agreement with the above plans.  All questions were answered to her satisfaction. she is encouraged to contact clinic should she have any questions or concerns prior to her return visit.  Follow up plan: Return in about 5 weeks (around 03/30/2021) for Thyroid follow up, Previsit  labs.   Thank you for involving me in the care of this pleasant patient, and I will continue to update you with her progress.  Rayetta Pigg, [facility] [address] [place], [place] [address] Phone: [phone] Fax: [phone]  02/23/2021, 11:31 AM

## 2021-03-02 ENCOUNTER — Telehealth: Payer: Self-pay | Admitting: Orthopedic Surgery

## 2021-03-02 NOTE — Telephone Encounter (Signed)
This is not a MD patient please see below.

## 2021-03-02 NOTE — Telephone Encounter (Signed)
Renato Gails from [facility] also called about pt for this patient concerning lymphedema. Please call Khalia back at [phone].

## 2021-03-04 ENCOUNTER — Encounter (HOSPITAL_BASED_OUTPATIENT_CLINIC_OR_DEPARTMENT_OTHER): Payer: Medicare Other | Admitting: Internal Medicine

## 2021-03-04 ENCOUNTER — Other Ambulatory Visit: Payer: Self-pay

## 2021-03-04 DIAGNOSIS — E1122 Type 2 diabetes mellitus with diabetic chronic kidney disease: Secondary | ICD-10-CM | POA: Insufficient documentation

## 2021-03-04 DIAGNOSIS — G8222 Paraplegia, incomplete: Secondary | ICD-10-CM | POA: Insufficient documentation

## 2021-03-04 DIAGNOSIS — I5032 Chronic diastolic (congestive) heart failure: Secondary | ICD-10-CM | POA: Insufficient documentation

## 2021-03-04 DIAGNOSIS — N186 End stage renal disease: Secondary | ICD-10-CM | POA: Insufficient documentation

## 2021-03-04 DIAGNOSIS — Z8673 Personal history of transient ischemic attack (TIA), and cerebral infarction without residual deficits: Secondary | ICD-10-CM | POA: Insufficient documentation

## 2021-03-04 DIAGNOSIS — I132 Hypertensive heart and chronic kidney disease with heart failure and with stage 5 chronic kidney disease, or end stage renal disease: Secondary | ICD-10-CM | POA: Insufficient documentation

## 2021-03-04 DIAGNOSIS — Z992 Dependence on renal dialysis: Secondary | ICD-10-CM | POA: Insufficient documentation

## 2021-03-04 DIAGNOSIS — L89152 Pressure ulcer of sacral region, stage 2: Secondary | ICD-10-CM | POA: Insufficient documentation

## 2021-03-04 DIAGNOSIS — L89322 Pressure ulcer of left buttock, stage 2: Secondary | ICD-10-CM | POA: Insufficient documentation

## 2021-03-04 DIAGNOSIS — I890 Lymphedema, not elsewhere classified: Secondary | ICD-10-CM | POA: Diagnosis present

## 2021-03-05 ENCOUNTER — Encounter: Payer: Self-pay | Admitting: Physical Medicine & Rehabilitation

## 2021-03-05 NOTE — Progress Notes (Signed)
[name] Barnett, [name] Barnett ([id number]) Visit Report for 03/04/2021 HPI Details Patient Name: Date of Service: [name] Barnett, [name] Barnett 03/04/2021 12:45 PM Medical Record Number: [id number] Patient Account Number: [id number] Date of Birth/Sex: Treating RN: [birth year] (40 y.o. [name] Primary Care Provider: SITA FA LWA LLA, SHA RMIN Other Clinician: Referring Provider: Treating Provider/Extender: Linton Ham SITA FA LWA LLA, SHA RMIN Weeks in Treatment: 9 History of Present Illness HPI Description: Admission 06/15/2019 This is a 40 year old woman who comes to [place] from [facility] skilled facility in [place]. I am able to follow a lot of her history on care everywhere in epic. The patient is a type II diabetic and has been on dialysis. 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 I am not exactly sure of the nature of the stroke at this present time. She has made a decent recovery and is now up walking with a walker. In August 2020 she was admitted to [facility] in [place] with L4-L5 discitis. Culture and sensitivity showed Klebsiella. She was discharged on 8 weeks of ceftazidime. In mid September she is readmitted to the hospital with a pressure ulcer on her lower sacrum and necrotizing fasciitis. She required an extensive surgical debridement. CT scan of the pelvis at 1 point showed communication with the rectum. A colostomy was recommended and placed. Culture of this area showed Klebsiella and yeast. She was discharged on an extended course of meropenem and Diflucan. [facility] [place] We do not have any of these records. I think she was discharged to another nursing home but is come to [facility] skilled facility in [facility]. She was sent down here for review of the remaining wound. By review of the pictures that the patient had from  September this is made considerable improvement. She tells me that the wound VAC was discontinued at the end of December 2020. Currently the facility is using a form of silver alginate to the wound surface. Past medical history type 2 diabetes with chronic renal failure on dialysis Tuesday Thursday and Saturday. CVA with left hemiparesis in June 2020 but she appears to be making a good recovery. Chronic diastolic heart failure, hypertension 2/19; patient's wound area slightly smaller. The area that was towards her anus has closed over. We are using Santyl covered with Hydrofera Blue. We have not been able to get her lab work apparently her veins are very difficult for phlebotomy. They are trying to get the blood work done where she dialyzes in [facility] but that requires some logistical issues. 2/26; not much change in the wound. Still a nonviable surface we have been using Santyl and Hydrofera Blue. I changed her to [facility] only. She tells me she has rigorously offloading this area she also complains of some pain 3/5; still no change here. I switch to pure Hydrofera Blue. She still complains of a lot of pain although is a big wound it seems out of proportion to what I might expect. This was initially a surgical wound secondary to necrotizing wound infection. She is on dialysis. Other than that she thinks she is offloading this fairly rigorously. She has not been systemically unwell 3/26; I switched to Iodoflex 3 weeks ago. This really seems to have helped, much better surface and slight improvement in the surface area. 4/26; the patient is still on Iodoflex she missed an appointment 2 weeks ago.  Doing slightly smaller. She is on dialysis 5/10; using Hydrofera Blue. The major improvement here is the complete lack of depth. She has islands of epithelialization 09/24/19-Patient comes in at 2 weeks, dimensions about the same, there is an [place] of epithelium at margin of wound, no slough  noted 10/15/2019 upon evaluation today patient appears to be doing well with regard to her wound. She has been tolerating the dressing changes in the sacral region without complication and at this point she seems to be healing quite nicely. I am extremely pleased with how things are progressing and the epithelial tissue seems to be spreading from the central portion as well which is also excellent. 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 We have been using Hydrofera Blue. The smaller area which has depth at 0.7 cm there is a question about whether they have been packing anything into this or just flaring Hydrofera Blue over the top 9/23; its been more than 2 months since this patient was here. She still has a small area distally which is the remanent of her stage IV wound. They have been using Hydrofera Blue. She also has an area on the left gluteal 10/22; 1 month follow-up. I think the original sacral wound is closed however the patient has 2 areas distally 1 over the coccyx a small pinpoint area and the other into the gluteal cleft. Neither 1 of these looks particularly infected. I thought they might be connected but they were not. 11/15; the original sacral wound is closed patient only has 1 remaining area. 1 of these is in the gluteal cleft just below the coccyx. The area over the coccyx from last time I think is all so closed 12/31/2020; this is a patient that we saw for a pressure ulcer over her lower sacrum and coccyx for a long period of time in 2021. She eventually healed over. She is at [facility] skilled facility largely secondary to a remote CVA she had. She is also end-stage renal disease on dialysis secondary to type 2 diabetes. She tells [place] that since about mid June she has had an area more distally than the original sacrum/coccyx wound it looks as though they are putting silver alginate in this area. She is limited in her  ability to offload this I think spending most of her time in a wheelchair although she can stand to transfer. She has an ostomy During her original stay here in 2021 I also list that she had a more distal area into her perineum that closed over well before the more proximal coccyx/sacral wound. She had a CT scan of the abdomen and pelvis on 12/01/2020. This did note first chronic sacral decubitus along the right aspect of her distal sacrum and coccyx. There was not felt to be bone destruction she has chronic changes of bilateral sacroiliitis and chronic endplate destruction and sclerosis at L4-L5 secondary to previous osteomyelitis however no new findings were felt to be seen in terms of bone problems in this area 01/14/2021; 2-week follow-up. Her wound is smaller essentially in the coccyx area. We have been using silver collagen. 10/5; 3-week follow-up. The patient returns with her area in the coccyx healed however distally in the perineum there is a fairly long stage II area. I am assuming this patient's wound is a pressure related area. Although this would not be a usual pressure area for a normal situation her immobility secondary to remote CVA,  dialysis etc. make her at risk for pressure areas in an otherwise unusual setting. The patient is fairly upset stating that we were told about this last time. I do not have this in my records. Her original wound was small and just about healed at that point and this visit was arranged in follow-up. If there was a new wound more distally into her perineum we did not look at it 11/2; patient's wound is slightly smaller. This is in the gluteal cleft on the left. Using silver alginate. She tells me she is aggressively offloading this area. 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 [name] Primary Care Provider: SITA FA LWA LLA, SHA RMIN Other Clinician: Referring Provider: Treating Provider/Extender: Linton Ham SITA FA LWA LLA, SHA RMIN Weeks in Treatment: 9 Constitutional Sitting or standing Blood Pressure is within target range for patient.. Pulse regular and within target range for patient.Marland Kitchen Respirations regular, non-labored and within target range.. Temperature is normal and within the target range for the patient.Marland Kitchen Appears in no distress. Notes Wound exam; this secondary wound is on the left buttock and the perineum. Raised thick edges of senescent looking skin. The wound surface does not look too bad. There is no evidence of surrounding infection Electronic Signature(s) Signed: 03/05/2021 5:02:06 PM By: Linton Ham MD Entered By: Linton Ham on 03/04/2021 13:22:41 -------------------------------------------------------------------------------- Physician Orders Details Patient Name: Date of Service: [name] Barnett, [name] Barnett 03/04/2021 12:45 PM Medical Record Number: [id number] Patient Account Number: [id number] Date of Birth/Sex: Treating RN: [birth year] (40 y.o. [name] Primary Care Provider: SITA FA LWA LLA, SHA RMIN Other Clinician: Referring Provider: Treating Provider/Extender: Linton Ham SITA FA LWA LLA, SHA RMIN Weeks in Treatment: 9 Verbal / Phone Orders: No Diagnosis Coding Follow-up Appointments Return appointment in 1 month. - with Dr. Dellia Nims Bathing/ Shower/ Hygiene May shower with protection but do not get wound dressing(s) wet. Off-Loading Turn and reposition every 2  hours Wound Treatment Wound #6 - Perineum Cleanser: Wound Cleanser Every Other Day/30 Days Discharge Instructions: Cleanse the wound with wound cleanser prior to applying a clean dressing using gauze sponges, not tissue or cotton balls. Peri-Wound Care: Skin Prep Every Other Day/30 Days Discharge Instructions: Use skin prep as directed Prim Dressing: KerraCel Ag Gelling Fiber Dressing, 2x2 in (silver alginate) Every Other Day/30 Days ary Discharge Instructions: Apply silver alginate to wound bed as instructed Secondary Dressing: Zetuvit Plus Silicone Border Dressing 4x4 (in/in) Every Other Day/30 Days Discharge Instructions: Apply silicone border over primary dressing as directed. Electronic Signature(s) Signed: 03/04/2021 4:50:32 PM By: Rhae Hammock RN Signed: 03/05/2021 5:02:06 PM By: Linton Ham MD Entered By: Rhae Hammock on 03/04/2021 13:10:43 -------------------------------------------------------------------------------- Problem List Details Patient Name: Date of Service: [name] Barnett, [name] Barnett 03/04/2021 12:45 PM Medical Record Number: [id number] Patient Account Number: [id number] Date of Birth/Sex: Treating RN: 01-18-1981 (40 y.o. [name] Primary Care Provider: SITA FA LWA LLA, SHA RMIN Other Clinician: Referring Provider: Treating Provider/Extender: Linton Ham SITA FA LWA LLA, SHA RMIN Weeks in Treatment: 9 Active Problems ICD-10 Encounter Code Description Active Date MDM Diagnosis L89.152 Pressure ulcer  of sacral region, stage 2 02/04/2021 No Yes G82.22 Paraplegia, incomplete 03/04/2021 No Yes Inactive Problems ICD-10 Code Description Active Date Inactive Date L89.312 Pressure ulcer of right buttock, stage 2 12/31/2020 12/31/2020 Resolved Problems Electronic Signature(s) Signed: 03/05/2021 5:02:06 PM By: Linton Ham MD Entered By: Linton Ham on 03/04/2021  13:21:35 -------------------------------------------------------------------------------- Progress Note Details Patient Name: Date of Service: [name] Barnett, [name] Barnett 03/04/2021 12:45 PM Medical Record Number: [id number] Patient Account Number: [id number] Date of Birth/Sex: Treating RN: 11-Jul-1980 (40 y.o. [name] Primary Care Provider: SITA FA LWA LLA, SHA RMIN Other Clinician: Referring Provider: Treating Provider/Extender: Linton Ham SITA FA LWA LLA, SHA RMIN Weeks in Treatment: 9 Subjective History of Present Illness (HPI) Admission 06/15/2019 This is a 40 year old woman who comes to [place] from [facility] skilled facility in [facility]. I am able to follow a lot of her history on care everywhere in epic. The patient is a type II diabetic and has been on dialysis. 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 I am not exactly sure of the nature of the stroke at this present time. She has made a decent recovery and is now up walking with a walker. In August 2020 she was admitted to [facility] in [place] with L4-L5 discitis. Culture and sensitivity showed Klebsiella. She was discharged on 8 weeks of ceftazidime. In mid September she is readmitted to the hospital with a pressure ulcer on her lower sacrum and necrotizing fasciitis. She required an extensive surgical debridement. CT scan of the pelvis at 1 point showed communication with the rectum. A colostomy was recommended and placed. Culture of this area showed Klebsiella and yeast. She was discharged on an extended course of meropenem and Diflucan. [facility] [place] We do not have any of these records. I think she was discharged to another nursing home but is come to [facility] skilled facility in [facility]. She was sent down here for review of the remaining wound. By review of the  pictures that the patient had from September this is made considerable improvement. She tells me that the wound VAC was discontinued at the end of December 2020. Currently the facility is using a form of silver alginate to the wound surface. Past medical history type 2 diabetes with chronic renal failure on dialysis Tuesday Thursday and Saturday. CVA with left hemiparesis in June 2020 but she appears to be making a good recovery. Chronic diastolic heart failure, hypertension 2/19; patient's wound area slightly smaller. The area that was towards her anus has closed over. We are using Santyl covered with Hydrofera Blue. We have not been able to get her lab work apparently her veins are very difficult for phlebotomy. They are trying to get the blood work done where she dialyzes in [facility] but that requires some logistical issues. 2/26; not much change in the wound. Still a nonviable surface we have been using Santyl and Hydrofera Blue. I changed her to [facility] only. She tells me she has rigorously offloading this area she also complains of some pain 3/5; still no change here. I switch to pure Hydrofera Blue. She still complains of a lot of pain although is a big wound it seems out of proportion to what I might expect. This was initially a surgical wound secondary to necrotizing wound infection. She is on dialysis. Other than that she thinks she is offloading  this fairly rigorously. She has not been systemically unwell 3/26; I switched to Iodoflex 3 weeks ago. This really seems to have helped, much better surface and slight improvement in the surface area. 4/26; the patient is still on Iodoflex she missed an appointment 2 weeks ago. Doing slightly smaller. She is on dialysis 5/10; using Hydrofera Blue. The major improvement here is the complete lack of depth. She has islands of epithelialization 09/24/19-Patient comes in at 2 weeks, dimensions about the same, there is an [place] of  epithelium at margin of wound, no slough noted 10/15/2019 upon evaluation today patient appears to be doing well with regard to her wound. She has been tolerating the dressing changes in the sacral region without complication and at this point she seems to be healing quite nicely. I am extremely pleased with how things are progressing and the epithelial tissue seems to be spreading from the central portion as well which is also excellent. 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 We have been using Hydrofera Blue. The smaller area which has depth at 0.7 cm there is a question about whether they have been packing anything into this or just flaring Hydrofera Blue over the top 9/23; its been more than 2 months since this patient was here. She still has a small area distally which is the remanent of her stage IV wound. They have been using Hydrofera Blue. She also has an area on the left gluteal 10/22; 1 month follow-up. I think the original sacral wound is closed however the patient has 2 areas distally 1 over the coccyx a small pinpoint area and the other into the gluteal cleft. Neither 1 of these looks particularly infected. I thought they might be connected but they were not. 11/15; the original sacral wound is closed patient only has 1 remaining area. 1 of these is in the gluteal cleft just below the coccyx. The area over the coccyx from last time I think is all so closed 12/31/2020; this is a patient that we saw for a pressure ulcer over her lower sacrum and coccyx for a long period of time in 2021. She eventually healed over. She is at [facility] skilled facility largely secondary to a remote CVA she had. She is also end-stage renal disease on dialysis secondary to type 2 diabetes. She tells [place] that since about mid June she has had an area more distally than the original sacrum/coccyx wound it looks as though they are putting silver  alginate in this area. She is limited in her ability to offload this I think spending most of her time in a wheelchair although she can stand to transfer. She has an ostomy During her original stay here in 2021 I also list that she had a more distal area into her perineum that closed over well before the more proximal coccyx/sacral wound. She had a CT scan of the abdomen and pelvis on 12/01/2020. This did note first chronic sacral decubitus along the right aspect of her distal sacrum and coccyx. There was not felt to be bone destruction she has chronic changes of bilateral sacroiliitis and chronic endplate destruction and sclerosis at L4-L5 secondary to previous osteomyelitis however no new findings were felt to be seen in terms of bone problems in this area 01/14/2021; 2-week follow-up. Her wound is smaller essentially in the coccyx area. We have been using silver collagen. 10/5; 3-week follow-up. The patient returns with her area  in the coccyx healed however distally in the perineum there is a fairly long stage II area. I am assuming this patient's wound is a pressure related area. Although this would not be a usual pressure area for a normal situation her immobility secondary to remote CVA, dialysis etc. make her at risk for pressure areas in an otherwise unusual setting. The patient is fairly upset stating that we were told about this last time. I do not have this in my records. Her original wound was small and just about healed at that point and this visit was arranged in follow-up. If there was a new wound more distally into her perineum we did not look at it 11/2; patient's wound is slightly smaller. This is in the gluteal cleft on the left. Using silver alginate. She tells me she is aggressively offloading this area. 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 Pulse regular and within target range for patient.Marland Kitchen Respirations regular, non-labored and within target range.. Temperature is normal and within the target range for the patient.Marland Kitchen Appears in no distress. Vitals Time Taken: 12:58 PM, Temperature: 98.7 F, Pulse: 88 bpm, Respiratory Rate: 17 breaths/min, Blood Pressure: 113/64 mmHg, Capillary Blood Glucose: 117 mg/dl. General Notes: Wound exam; this secondary wound is on the left buttock and the perineum. Raised thick edges of senescent looking skin. The wound surface does not look too bad. There is no evidence of surrounding infection Integumentary (Hair, Skin) Wound #6 status is Open. Original cause of wound was Pressure Injury. The date acquired was: 02/04/2021. The wound has been in treatment 4 weeks. The wound is located on the Perineum. The wound measures 2.5cm length x 1.3cm width x 0.2cm depth; 2.553cm^2 area and 0.511cm^3 volume. There is Fat Layer (Subcutaneous Tissue) exposed. There is no tunneling noted, however, there is undermining starting at 12:00 and ending at 12:00 with a maximum distance of 0.5cm. There is a medium amount of serosanguineous drainage noted. The wound margin is flat and intact. There is large (67-100%) pink granulation within the wound bed. There is no necrotic tissue within the wound bed. Assessment Active Problems ICD-10 Pressure ulcer of sacral region, stage 2 Paraplegia, incomplete Plan Follow-up Appointments: Return appointment in 1 month. - with Dr. Arcola Jansky Shower/ Hygiene: May shower with protection but do not get wound dressing(s) wet. Off-Loading: Turn and reposition every 2 hours WOUND #6: - Perineum Wound Laterality: Cleanser: Wound Cleanser Every Other Day/30 Days Discharge Instructions: Cleanse the wound with wound cleanser prior to applying a clean dressing using gauze sponges, not tissue or cotton balls. Peri-Wound Care: Skin  Prep Every Other Day/30 Days Discharge Instructions: Use skin prep as directed Prim Dressing: KerraCel Ag Gelling Fiber Dressing, 2x2 in (silver alginate) Every Other Day/30 Days ary Discharge Instructions: Apply silver alginate to wound bed as instructed Secondary Dressing: Zetuvit Plus Silicone Border Dressing 4x4 (in/in) Every Other Day/30 Days Discharge Instructions: Apply silicone border over primary dressing as directed. 1. The patient's wound is measuring smaller. 2. Not a usual spot for a pressure ulcer but given this patient's underlying spinal cord injury and the fact she is on dialysis 3 times a week this might account for the atypical location. 3. I had some thoughts about in the difficult debridement of the rolled tissue and the wound edges but still might  be necessary Electronic Signature(s) Signed: 03/05/2021 5:02:06 PM By: Linton Ham MD Entered By: Linton Ham on 03/04/2021 13:24:24 -------------------------------------------------------------------------------- SuperBill Details Patient Name: Date of Service: [name] Barnett, [name] Barnett 03/04/2021 Medical Record Number: [id number] Patient Account Number: [id number] Date of Birth/Sex: Treating RN: 11/10/1980 (40 y.o. [name] Primary Care Provider: SITA FA LWA LLA, SHA RMIN Other Clinician: Referring Provider: Treating Provider/Extender: Linton Ham SITA FA LWA LLA, SHA RMIN Weeks in Treatment: 9 Diagnosis Coding ICD-10 Codes Code Description K81.275 Pressure ulcer of sacral region, stage 2 Facility Procedures Physician Procedures : CPT4 Code Description Modifier [id number] - WC PHYS LEVEL 3 - EST PT ICD-10 Diagnosis Description L89.152 Pressure ulcer of sacral region, stage 2 Quantity: 1 Electronic Signature(s) Signed: 03/05/2021 5:02:06 PM By: Linton Ham MD Entered By: Linton Ham on 03/04/2021 13:24:44

## 2021-03-05 NOTE — Progress Notes (Signed)
[name] Barnett, RASK ([id number]) Visit Report for [name] Barnett Arrival Information Details Patient Name: Date of Service: [name] Barnett, [name] Barnett [name] Barnett 12:45 PM Medical Record Number: [id number] Patient Account Number: [id number] Date of Birth/Sex: Treating RN: [name] Barnett (40 y.o. [name] Barnett, [name] Barnett Primary Care [name] Barnett: [name] Barnett Other Clinician: Referring [name] Barnett: Treating [name] Barnett/Extender: [name] Barnett [name] Barnett Weeks in Treatment: 9 Visit Information History Since Last Visit Added or deleted any medications: No Patient Arrived: Wheel Chair Any new allergies or adverse reactions: No Arrival Time: 12:56 Had a fall or experienced change in No Accompanied By: self activities of daily living that may affect Transfer Assistance: Manual risk of falls: Patient Identification Verified: Yes Signs or symptoms of abuse/neglect since last visito No Secondary Verification Process Completed: Yes Hospitalized since last visit: No Patient Requires Transmission-Based Precautions: No Implantable device outside of the clinic excluding No Patient Has Alerts: No cellular tissue based products placed in the center since last visit: Has Dressing in Place as Prescribed: Yes Pain Present Now: Yes Electronic Signature(s) Signed: 03/04/2021 4:50:32 PM By: [name] Hammock RN Entered By: [name] Barnett on 03/04/2021 12:56:55 -------------------------------------------------------------------------------- Clinic Level of Care Assessment Details Patient Name: Date of Service: [name] DA [name] Barnett [name] Barnett 12:45 PM Medical Record Number: [id number] Patient Account Number: [id number] Date of Birth/Sex: Treating RN: [name] Barnett ([name] y.o. [name] Barnett, [name] Barnett Primary Care [name] Barnett: [name] Barnett Other Clinician: Referring [name] Barnett: Treating [name] Barnett/Extender: [name] Barnett [name] Barnett Weeks in Treatment: 9 Clinic Level of Care  Assessment Items TOOL 4 Quantity Score X- 1 0 Use when only an EandM is performed on FOLLOW-UP visit ASSESSMENTS - Nursing Assessment / Reassessment X- 1 10 Reassessment of Co-morbidities (includes updates in patient status) X- 1 5 Reassessment of Adherence to Treatment Plan ASSESSMENTS - Wound and Skin A ssessment / Reassessment X - Simple Wound Assessment / Reassessment - one wound 1 5 []  - 0 Complex Wound Assessment / Reassessment - multiple wounds X- 1 10 Dermatologic / Skin Assessment (not related to wound area) ASSESSMENTS - Focused Assessment []  - 0 Circumferential Edema Measurements - multi extremities []  - 0 Nutritional Assessment / Counseling / Intervention []  - 0 Lower Extremity Assessment (monofilament, tuning fork, pulses) []  - 0 Peripheral Arterial Disease Assessment (using hand held doppler) ASSESSMENTS - Ostomy and/or Continence Assessment and Care []  - 0 Incontinence Assessment and Management []  - 0 Ostomy Care Assessment and Management (repouching, etc.) PROCESS - Coordination of Care X - Simple Patient / Family Education for ongoing care 1 15 []  - 0 Complex (extensive) Patient / Family Education for ongoing care X- 1 10 Staff obtains Programmer, systems, Records, T Results / Process Orders est X- 1 10 Staff telephones HHA, Nursing Homes / Clarify orders / etc []  - 0 Routine Transfer to another Facility (non-emergent condition) []  - 0 Routine Hospital Admission (non-emergent condition) []  - 0 New Admissions / Biomedical engineer / Ordering NPWT Apligraf, etc. , []  - 0 Emergency Hospital Admission (emergent condition) X- 1 10 Simple Discharge Coordination []  - 0 Complex (extensive) Discharge Coordination PROCESS - Special Needs []  - 0 Pediatric / Minor Patient Management []  - 0 Isolation Patient Management []  - 0 Hearing / Language / Visual special needs []  - 0 Assessment of Community assistance (transportation, D/C planning, etc.) []  -  0 Additional assistance / Altered mentation []  - 0 Support Surface(s) Assessment (bed, cushion, seat, etc.) INTERVENTIONS - Wound Cleansing / Measurement X - Simple  Wound Cleansing - one wound 1 5 []  - 0 Complex Wound Cleansing - multiple wounds X- 1 5 Wound Imaging (photographs - any number of wounds) []  - 0 Wound Tracing (instead of photographs) X- 1 5 Simple Wound Measurement - one wound []  - 0 Complex Wound Measurement - multiple wounds INTERVENTIONS - Wound Dressings X - Small Wound Dressing one or multiple wounds 1 10 []  - 0 Medium Wound Dressing one or multiple wounds []  - 0 Large Wound Dressing one or multiple wounds X- 1 5 Application of Medications - topical []  - 0 Application of Medications - injection INTERVENTIONS - Miscellaneous []  - 0 External ear exam []  - 0 Specimen Collection (cultures, biopsies, blood, body fluids, etc.) []  - 0 Specimen(s) / Culture(s) sent or taken to Lab for analysis []  - 0 Patient Transfer (multiple staff / Civil Service fast streamer / Similar devices) []  - 0 Simple Staple / Suture removal (25 or less) []  - 0 Complex Staple / Suture removal (26 or more) []  - 0 Hypo / Hyperglycemic Management (close monitor of Blood Glucose) []  - 0 Ankle / Brachial Index (ABI) - do not check if billed separately X- 1 5 Vital Signs Has the patient been seen at the hospital within the last three years: Yes Total Score: 110 Level Of Care: New/Established - Level 3 Electronic Signature(s) Signed: 03/04/2021 4:50:32 PM By: [name] Hammock RN Entered By: [name] Barnett on 03/04/2021 13:11:52 -------------------------------------------------------------------------------- Encounter Discharge Information Details Patient Name: Date of Service: [name] Barnett, [name] Barnett [name] Barnett 12:45 PM Medical Record Number: [id number] Patient Account Number: [id number] Date of Birth/Sex: Treating RN: [name] Barnett (40 y.o. [name] Barnett Primary Care [name] Barnett: [name] FA LWA  LLA, SHA Barnett Other Clinician: Referring Everly Rubalcava: Treating Lyvonne Cassell/Extender: [name] Barnett [name] Barnett Weeks in Treatment: 9 Encounter Discharge Information Items Discharge Condition: Stable Ambulatory Status: Wheelchair Discharge Destination: Home Transportation: Private Auto Accompanied By: self Schedule Follow-up Appointment: Yes Clinical Summary of Care: Patient Declined Electronic Signature(s) Signed: 03/04/2021 4:50:32 PM By: [name] Hammock RN Entered By: [name] Barnett on 03/04/2021 13:18:14 -------------------------------------------------------------------------------- Lower Extremity Assessment Details Patient Name: Date of Service: [name] Barnett, [name] Barnett [name] Barnett 12:45 PM Medical Record Number: [id number] Patient Account Number: [id number] Date of Birth/Sex: Treating RN: [birth year] Barnett (40 y.o. [name] Barnett Primary Care Saachi Zale: [name] FA Janace Aris, SHA Barnett Other Clinician: Referring Shanetha Bradham: Treating Ayomide Purdy/Extender: [name] Barnett [name] Barnett Weeks in Treatment: 9 Electronic Signature(s) Signed: 03/04/2021 4:50:32 PM By: [name] Hammock RN Entered By: [name] Barnett on 03/04/2021 12:59:56 -------------------------------------------------------------------------------- Multi Wound Chart Details Patient Name: Date of Service: [name] Barnett, [name] Barnett [name] Barnett 12:45 PM Medical Record Number: [id number] Patient Account Number: [id number] Date of Birth/Sex: Treating RN: [birth year] (40 y.o. [name] Primary Care Armon Orvis: [name] Barnett Other Clinician: Referring Lorien Shingler: Treating Luie Laneve/Extender: [name] Barnett [name] Barnett Weeks in Treatment: 9 Vital Signs Height(in): Capillary Blood Glucose(mg/dl): 117 Weight(lbs): Pulse(bpm): 88 Body Mass Index(BMI): Blood Pressure(mmHg): 113/64 Temperature(F): 98.7 Respiratory Rate(breaths/min): 17 Photos: [6:No Photos Perineum]  [N/A:N/A N/A] Wound Location: [6:Pressure Injury] [N/A:N/A] Wounding Event: [6:Pressure Ulcer] [N/A:N/A] Primary Etiology: [6:Cataracts, Glaucoma, Anemia,] [N/A:N/A] Comorbid History: [6:Congestive Heart Failure, Hypertension, Type II Diabetes, End Stage Renal Disease, Neuropathy, Confinement Anxiety 02/04/2021] [N/A:N/A] Date Acquired: [6:4] [N/A:N/A] Weeks of Treatment: [6:Open] [N/A:N/A] Wound Status: [6:2.5x1.3x0.2] [N/A:N/A] Measurements L x W x D (cm) [6:2.553] [N/A:N/A] A (cm) : rea [6:0.511] [N/A:N/A] Volume (cm) : [6:-45.10%] [N/A:N/A] % Reduction in A rea: [6:-45.20%] [N/A:N/A] %  Reduction in Volume: [6:12] Starting Position 1 (o'clock): [6:12] Ending Position 1 (o'clock): [6:0.5] Maximum Distance 1 (cm): [6:Yes] [N/A:N/A] Undermining: [6:Category/Stage II] [N/A:N/A] Classification: [6:Medium] [N/A:N/A] Exudate A mount: [6:Serosanguineous] [N/A:N/A] Exudate Type: [6:red, brown] [N/A:N/A] Exudate Color: [6:Flat and Intact] [N/A:N/A] Wound Margin: [6:Large (67-100%)] [N/A:N/A] Granulation A mount: [6:Pink] [N/A:N/A] Granulation Quality: [6:None Present (0%)] [N/A:N/A] Necrotic A mount: [6:Fat Layer (Subcutaneous Tissue): Yes N/A] Exposed Structures: [6:Fascia: No Tendon: No Muscle: No Joint: No Bone: No None] [N/A:N/A] Treatment Notes Wound #6 (Perineum) Cleanser Wound Cleanser Discharge Instruction: Cleanse the wound with wound cleanser prior to applying a clean dressing using gauze sponges, not tissue or cotton balls. Peri-Wound Care Skin Prep Discharge Instruction: Use skin prep as directed Topical Primary Dressing KerraCel Ag Gelling Fiber Dressing, 2x2 in (silver alginate) Discharge Instruction: Apply silver alginate to wound bed as instructed Secondary Dressing Zetuvit Plus Silicone Border Dressing 4x4 (in/in) Discharge Instruction: Apply silicone border over primary dressing as directed. Secured With Compression Wrap Compression  Stockings Environmental education officer) Signed: 03/05/2021 5:02:06 PM By: [name] Ham MD Signed: 03/05/2021 5:51:35 PM By: Levan Hurst RN, BSN Entered By: [name] Barnett on 03/04/2021 13:18:41 -------------------------------------------------------------------------------- Multi-Disciplinary Care Plan Details Patient Name: Date of Service: [name] Barnett, [name] Barnett [name] Barnett 12:45 PM Medical Record Number: [id number] Patient Account Number: [id number] Date of Birth/Sex: Treating RN: [birth year] (40 y.o. [name] Barnett Primary Care Sasha Rogel: [name] Barnett Other Clinician: Referring Jere Vanburen: Treating Shenique [name] Barnett/Extender: [name] Barnett [name] Barnett Weeks in Treatment: 9 Active Inactive Wound/Skin Impairment Nursing Diagnoses: Impaired tissue integrity Knowledge deficit related to ulceration/compromised skin integrity Goals: Patient will have a decrease in wound volume by X% from date: (specify in notes) Date Initiated: 12/31/2020 Date Inactivated: 02/04/2021 Target Resolution Date: 01/07/2021 Goal Status: Met Patient/caregiver will verbalize understanding of skin care regimen Date Initiated: 12/31/2020 Target Resolution Date: 03/06/2021 Goal Status: Active Ulcer/skin breakdown will have a volume reduction of 30% by week 4 Date Initiated: 12/31/2020 Date Inactivated: 01/14/2021 Target Resolution Date: 01/10/2021 Goal Status: Met Ulcer/skin breakdown will have a volume reduction of 50% by week 8 Date Initiated: 12/31/2020 Date Inactivated: 01/14/2021 Target Resolution Date: 01/23/2021 Goal Status: Met Ulcer/skin breakdown will have a volume reduction of 80% by week 12 Date Initiated: 12/31/2020 Date Inactivated: 02/04/2021 Target Resolution Date: 02/18/2021 Goal Status: Met Interventions: Assess patient/caregiver ability to obtain necessary supplies Assess patient/caregiver ability to perform ulcer/skin care regimen upon admission and as  needed Assess ulceration(s) every visit Notes: Electronic Signature(s) Signed: 03/04/2021 4:50:32 PM By: [name] Hammock RN Entered By: [name] Barnett on 03/04/2021 13:10:58 -------------------------------------------------------------------------------- Pain Assessment Details Patient Name: Date of Service: [name] Barnett, [name] Barnett [name] Barnett 12:45 PM Medical Record Number: [id number] Patient Account Number: [id number] Date of Birth/Sex: Treating RN: [birth year] Barnett (40 y.o. [name] Barnett Primary Care Toshiyuki Fredell: [name] Barnett Other Clinician: Referring Tailer Volkert: Treating Gordana Kewley/Extender: [name] Barnett [name] Barnett Weeks in Treatment: 9 Active Problems Location of Pain Severity and Description of Pain Patient Has Paino Yes Site Locations Pain Location: Pain in Ulcers With Dressing Change: Yes Duration of the Pain. Constant / Intermittento Constant Rate the pain. Current Pain Level: 8 Worst Pain Level: 10 Least Pain Level: 0 Tolerable Pain Level: 8 Character of Pain Describe the Pain: Aching Pain Management and Medication Current Pain Management: Medication: No Cold Application: No Rest: No Massage: No Activity: No T.E.N.S.: No Heat Application: No Leg drop or elevation: No Is the Current Pain Management Adequate: Adequate How  does your wound impact your activities of daily livingo Sleep: No Bathing: No Appetite: No Relationship With Others: No Bladder Continence: No Emotions: No Bowel Continence: No Work: No Toileting: No Drive: No Dressing: No Hobbies: No Electronic Signature(s) Signed: 03/04/2021 4:50:32 PM By: [name] Hammock RN Entered By: [name] Barnett on 03/04/2021 12:59:49 -------------------------------------------------------------------------------- Patient/Caregiver Education Details Patient Name: Date of Service: [name] Barnett 11/2/2022andnbsp12:45 PM Medical Record Number: [id number] Patient  Account Number: [id number] Date of Birth/Gender: Treating RN: [birth year] (40 y.o. [name] Barnett Primary Care Physician: [name] Barnett Other Clinician: Referring Physician: Treating Physician/Extender: [name] Barnett [name] Barnett Weeks in Treatment: 9 Education Assessment Education Provided To: Patient Education Topics Provided Wound/Skin Impairment: Methods: Explain/Verbal Responses: Reinforcements needed, State content correctly Electronic Signature(s) Signed: 03/04/2021 4:50:32 PM By: [name] Hammock RN Entered By: [name] Barnett on 03/04/2021 13:11:15 -------------------------------------------------------------------------------- Wound Assessment Details Patient Name: Date of Service: [name] Barnett, [name] Barnett [name] Barnett 12:45 PM Medical Record Number: [id number] Patient Account Number: [id number] Date of Birth/Sex: Treating RN: [birth year] (40 y.o. [name] Barnett, [name] Barnett Primary Care Bostyn Kunkler: [name] Barnett Other Clinician: Referring Reah Justo: Treating Kynadi Dragos/Extender: [name] Barnett [name] Barnett Weeks in Treatment: 9 Wound Status Wound Number: 6 Primary Pressure Ulcer Etiology: Wound Location: Perineum Wound Open Wounding Event: Pressure Injury Status: Date Acquired: 02/04/2021 Comorbid Cataracts, Glaucoma, Anemia, Congestive Heart Failure, Weeks Of Treatment: 4 History: Hypertension, Type II Diabetes, End Stage Renal Disease, Clustered Wound: No Neuropathy, Confinement Anxiety Wound Measurements Length: (cm) 2.5 Width: (cm) 1.3 Depth: (cm) 0.2 Area: (cm) 2.553 Volume: (cm) 0.511 % Reduction in Area: -45.1% % Reduction in Volume: -45.2% Epithelialization: None Tunneling: No Undermining: Yes Starting Position (o'clock): 12 Ending Position (o'clock): 12 Maximum Distance: (cm) 0.5 Wound Description Classification: Category/Stage II Wound Margin: Flat and Intact Exudate Amount: Medium Exudate  Type: Serosanguineous Exudate Color: red, brown Foul Odor After Cleansing: No Slough/Fibrino No Wound Bed Granulation Amount: Large (67-100%) Exposed Structure Granulation Quality: Pink Fascia Exposed: No Necrotic Amount: None Present (0%) Fat Layer (Subcutaneous Tissue) Exposed: Yes Tendon Exposed: No Muscle Exposed: No Joint Exposed: No Bone Exposed: No Treatment Notes Wound #6 (Perineum) Cleanser Wound Cleanser Discharge Instruction: Cleanse the wound with wound cleanser prior to applying a clean dressing using gauze sponges, not tissue or cotton balls. Peri-Wound Care Skin Prep Discharge Instruction: Use skin prep as directed Topical Primary Dressing KerraCel Ag Gelling Fiber Dressing, 2x2 in (silver alginate) Discharge Instruction: Apply silver alginate to wound bed as instructed Secondary Dressing Zetuvit Plus Silicone Border Dressing 4x4 (in/in) Discharge Instruction: Apply silicone border over primary dressing as directed. Secured With Compression Wrap Compression Stockings Environmental education officer) Signed: 03/04/2021 4:50:32 PM By: [name] Hammock RN Entered By: [name] Barnett on 03/04/2021 13:09:06 -------------------------------------------------------------------------------- Vitals Details Patient Name: Date of Service: [name] DA ELMINA, HENDEL [name] Barnett 12:45 PM Medical Record Number: [id number] Patient Account Number: [id number] Date of Birth/Sex: Treating RN: [birth year] Barnett (40 y.o. [name] Barnett, [name] Barnett Primary Care Avaree Gilberti: [name] Barnett Other Clinician: Referring Kely Dohn: Treating Jabrea Kallstrom/Extender: [name] Barnett [name] Barnett Weeks in Treatment: 9 Vital Signs Time Taken: 12:58 Temperature (F): 98.7 Pulse (bpm): 88 Respiratory Rate (breaths/min): 17 Blood Pressure (mmHg): 113/64 Capillary Blood Glucose (mg/dl): 117 Reference Range: 80 - 120 mg / dl Electronic Signature(s) Signed: 03/04/2021 4:50:32 PM By:  [name] Hammock RN Entered By: [name] Barnett on 03/04/2021 12:59:07

## 2021-03-07 NOTE — Progress Notes (Signed)
No show

## 2021-03-09 ENCOUNTER — Encounter: Payer: Self-pay | Admitting: Cardiology

## 2021-03-09 ENCOUNTER — Ambulatory Visit (INDEPENDENT_AMBULATORY_CARE_PROVIDER_SITE_OTHER): Payer: Medicare Other | Admitting: Cardiology

## 2021-03-09 VITALS — BP 138/90 | HR 80 | Ht 65.0 in | Wt 185.4 lb

## 2021-03-09 DIAGNOSIS — E782 Mixed hyperlipidemia: Secondary | ICD-10-CM

## 2021-03-09 DIAGNOSIS — I2510 Atherosclerotic heart disease of native coronary artery without angina pectoris: Secondary | ICD-10-CM | POA: Diagnosis not present

## 2021-03-09 DIAGNOSIS — R002 Palpitations: Secondary | ICD-10-CM | POA: Diagnosis not present

## 2021-03-09 DIAGNOSIS — I10 Essential (primary) hypertension: Secondary | ICD-10-CM | POA: Diagnosis not present

## 2021-03-09 NOTE — Patient Instructions (Signed)

## 2021-03-09 NOTE — Progress Notes (Signed)
Clinical Summary [name] Barnett is a 40 y.o.female seen today for follow up of the following medical problems.   1. Palpitations - feeling of heart pounding, few times a week. Very often occurs at night. Can feel sweaty. Can last up to 30 minutes - no coffee, occasional mountain dews, occasional green tea, no energy drinks, no EtoH  - 10/2020 event monitor: rare SVT up to 8 beats, rare PACs and PVCs - infrequent symptoms, last episode was 2 months ago   2. ESRD - Tues, Thurs, Sat - bp's get low with sessions. Does not take bp meds on HD days     3. History of CVA   4. Chronic diastolic HF - fluid managed by dialysis   5. CAD - prior notes mention NSTEMI, had PCI 01/2017 unclear details. Appears has had a stent to RCA and LAD  - no recent chest pain  6. HTN - does not take bp meds on HD days due to issues with low bp's on HD   Past Medical History:  Diagnosis Date   Diabetes mellitus without complication ([name] Barnett)    Hyperlipidemia    Hypertension    Renal disorder    Stroke [name] Barnett)    Vitamin D deficiency      Allergies  Allergen Reactions   Corn-Containing Products     Per MAR   Lisinopril     Per MAR   Morphine And Related     Per MAR     Current Outpatient Medications  Medication Sig Dispense Refill   acetaminophen (TYLENOL) 325 MG tablet Take 650 mg by mouth every 6 (six) hours as needed.     amLODipine (NORVASC) 5 MG tablet Take 5 mg by mouth See admin instructions. Sun, Mon, Wed and Fri     ascorbic acid (VITAMIN C) 500 MG tablet Take 500 mg by mouth 2 (two) times daily.     aspirin EC 81 MG tablet Take 81 mg by mouth daily. Swallow whole.     atenolol (TENORMIN) 25 MG tablet Take 0.5 tablets (12.5 mg total) by mouth daily. (Patient taking differently: Take 12.5 mg by mouth See admin instructions. Every Sun, Mon, Wed and Fri)     atorvastatin (LIPITOR) 80 MG tablet Take 1 tablet by mouth at bedtime.     b complex-vitamin c-folic acid (NEPHRO-VITE)  0.8 MG TABS tablet Take 1 tablet by mouth daily.     Biotin 5 MG TABS Take 5 mg by mouth in the morning and at bedtime.     busPIRone (BUSPAR) 5 MG tablet Take 5 mg by mouth 2 (two) times daily.     calcium carbonate (TUMS - DOSED IN MG ELEMENTAL CALCIUM) 500 MG chewable tablet Chew 1,000 mg by mouth 3 (three) times daily. Before meals     cetaphil (CETAPHIL) lotion Apply 1 application topically 2 (two) times daily as needed for dry skin.     Cholecalciferol (VITAMIN D) 50 MCG (2000 UT) CAPS Take 2,000 Units by mouth daily.     dicyclomine (BENTYL) 10 MG capsule Take 10 mg by mouth 3 (three) times daily before meals.     HUMALOG 100 UNIT/ML injection Inject 5 Units into the skin 3 (three) times daily before meals.     HYDROcodone-acetaminophen (NORCO/VICODIN) 5-325 MG tablet Take 1 tablet by mouth 2 (two) times daily as needed. (Patient not taking: Reported on 02/23/2021)     LANTUS 100 UNIT/ML injection Inject 10 Units into the skin in the  morning.     Lidocaine 4 % PTCH Apply 1 application topically daily at 6 (six) AM.     lidocaine-prilocaine (EMLA) cream Apply 1 application topically daily as needed. Apply to HD graft site right arm topically one every tues, thurs, sat for pain.     melatonin 3 MG TABS tablet Take 3 mg by mouth at bedtime.     Multiple Vitamins-Minerals (CERTAGEN PO) Take 1 tablet by mouth in the morning.     nepafenac (NEVANAC) 0.1 % ophthalmic suspension Place 1 drop into the right eye daily. (Patient not taking: Reported on 02/23/2021)     nystatin cream (MYCOSTATIN) Apply topically.     ondansetron (ZOFRAN ODT) 4 MG disintegrating tablet Take 1 tablet (4 mg total) by mouth every 8 (eight) hours as needed for nausea or vomiting. 30 tablet 0   oxyCODONE-acetaminophen (PERCOCET) 5-325 MG tablet Take 1 tablet by mouth every 4 (four) hours as needed. (Patient not taking: Reported on 02/23/2021) 30 tablet 0   pantoprazole (PROTONIX) 40 MG tablet Take 40 mg by mouth in the  morning.     polyethylene glycol (MIRALAX / GLYCOLAX) 17 g packet Take 17 g by mouth daily.     prednisoLONE acetate (PRED FORTE) 1 % ophthalmic suspension Place 1 drop into the right eye 2 (two) times daily. (Patient not taking: Reported on 02/23/2021)     predniSONE (DELTASONE) 5 MG tablet Take 1 tablet (5 mg total) by mouth daily with breakfast. 30 tablet 0   rOPINIRole (REQUIP) 0.5 MG tablet Take 0.5 mg by mouth at bedtime.     rOPINIRole (REQUIP) 2 MG tablet Take 2 mg by mouth at bedtime.     sennosides-docusate sodium (SENOKOT-S) 8.6-50 MG tablet Take 2 tablets by mouth daily.     tamsulosin (FLOMAX) 0.4 MG CAPS capsule Take 1 capsule (0.4 mg total) by mouth daily. 30 capsule 0   thiamine 100 MG tablet Take 100 mg by mouth daily.     tiZANidine (ZANAFLEX) 4 MG tablet Take 4 mg by mouth at bedtime.     traMADol (ULTRAM) 50 MG tablet Take 100 mg by mouth every 6 (six) hours as needed.     No current facility-administered medications for this visit.     Past Surgical History:  Procedure Laterality Date   cardiac stents     CATARACT EXTRACTION W/PHACO Left 08/29/2020   Procedure: CATARACT EXTRACTION PHACO AND INTRAOCULAR LENS PLACEMENT LEFT EYE;  Surgeon: Baruch Goldmann, MD;  Location: [facility];  Service: Ophthalmology;  Laterality: Left;  left CDE=29.30   CATARACT EXTRACTION W/PHACO Right 11/28/2020   Procedure: CATARACT EXTRACTION PHACO AND INTRAOCULAR LENS PLACEMENT (IOC);  Surgeon: Baruch Goldmann, MD;  Location: [facility];  Service: Ophthalmology;  Laterality: Right;  CDE 4.35   COLOSTOMY     TUBAL LIGATION     wound on buttocks       Allergies  Allergen Reactions   Corn-Containing Products     Per MAR   Lisinopril     Per MAR   Morphine And Related     Per MAR      Family History  Problem Relation Age of Onset   Cancer Paternal Grandmother    Heart disease Father    Hypertension Father    Diabetes Father    Glaucoma Father    Heart disease Mother    Hypertension  Mother    Diabetes Mother    Diabetes Sister        borderline  Hypertension Sister      Social History [name] reports that she has never smoked. She has never used smokeless tobacco. [name] reports no history of alcohol use.   Review of Systems CONSTITUTIONAL: No weight loss, fever, chills, weakness or fatigue.  HEENT: Eyes: No visual loss, blurred vision, double vision or yellow sclerae.No hearing loss, sneezing, congestion, runny nose or sore throat.  SKIN: No rash or itching.  CARDIOVASCULAR: per hpi RESPIRATORY: No shortness of breath, cough or sputum.  GASTROINTESTINAL: No anorexia, nausea, vomiting or diarrhea. No abdominal pain or blood.  GENITOURINARY: No burning on urination, no polyuria NEUROLOGICAL: No headache, dizziness, syncope, paralysis, ataxia, numbness or tingling in the extremities. No change in bowel or bladder control.  MUSCULOSKELETAL: No muscle, back pain, joint pain or stiffness.  LYMPHATICS: No enlarged nodes. No history of splenectomy.  PSYCHIATRIC: No history of depression or anxiety.  ENDOCRINOLOGIC: No reports of sweating, cold or heat intolerance. No polyuria or polydipsia.  Marland Kitchen   Physical Examination Today's Vitals   03/09/21 1104  BP: 138/90  Pulse: 80  SpO2: 99%  Weight: 185 lb 6.4 oz (84.1 kg)  Height: 5\' 5"  (1.651 m)   Body mass index is 30.85 kg/m.  Gen: resting comfortably, no acute distress HEENT: no scleral icterus, pupils equal round and reactive, no palptable cervical adenopathy,  CV: RRR, no m/r/g, no jvd Resp: Clear to auscultation bilaterally GI: abdomen is soft, non-tender, non-distended, normal bowel sounds, no hepatosplenomegaly MSK: extremities are warm, no edema.  Skin: warm, no rash Neuro:  no focal deficits Psych: appropriate affect   Diagnostic Studies [facility] 02/02/2017 Coronary arteries Dominance: right Left main: normal LAD: insignificant LCx: ramus 99% - medium; OM2 70% -  medium RCA: insignificant RA: 14 mmHg (mean) RV: 62/ 14 mmHg PA: 62/ 25 42 mmHg (mean) PCW: 25 mmHg (mean) AV O2: 4.6 vol% Cardiac output: 5.0 L/min Cardiac index: 2.7 L/min-m2 PVR: 3.4 Wood units Impressions Elevated right and left sided filling pressures with a preserved cardiac index Significant systemic hypertension in 190-200 mm Hg range Patent stents in the mid LAD and mid RCA Total occlusion of the ramus with faint collaterals Distal small vessel disease in the circumflex and small PL system 20 ml of contrast used with biplane angiography Findings discussed with primary team Recommendations Continue medical therapy for distal small vessel CAD and diuresis/BP control for HFpEF   10/2020 heart monitor Rare supraventricular ectopy. Rare episodes of SVT up to 8 beats Rare ventricular ectopy No symptoms reported     Patch Wear Time:  7 days and 1 hours (2022-06-01T08:59:11-398 to 2022-06-08T10:24:48-0400)   Patient had a min HR of 73 bpm, max HR of 154 bpm, and avg HR of 85 bpm. Predominant underlying rhythm was Sinus Rhythm. 10 Supraventricular Tachycardia runs occurred, the run with the fastest interval lasting 7 beats with a max rate of 154 bpm, the  longest lasting 8 beats with an avg rate of 125 bpm. Isolated SVEs were rare (<1.0%), SVE Couplets were rare (<1.0%), and SVE Triplets were rare (<1.0%). Isolated VEs were rare (<1.0%), VE Couplets were rare (<1.0%), and no VE Triplets were present.    Assessment and Plan   1. Palpitations - prior monitor with benign ectopy, very short runs of SVT.  - no recent significant symptoms, she is on atenolol, continue  2. Hyperlipidemia - request labs from dialysis, continue atorvastatin  3. HTN - reasonable bp today, have been limited on management due to low bp's  with HD - continue current meds  4. CAD - no recent symptoms, continue current meds     Arnoldo Lenis, M.D.

## 2021-03-10 ENCOUNTER — Encounter: Payer: Self-pay | Admitting: *Deleted

## 2021-03-19 ENCOUNTER — Encounter: Payer: Medicare Other | Admitting: Diagnostic Neuroimaging

## 2021-03-25 LAB — TSH: TSH: 1.55 (ref 0.41–5.90)

## 2021-03-25 LAB — HEMOGLOBIN A1C: Hemoglobin A1C: 8.1

## 2021-03-30 ENCOUNTER — Encounter: Payer: Self-pay | Admitting: Nurse Practitioner

## 2021-03-30 ENCOUNTER — Other Ambulatory Visit: Payer: Self-pay

## 2021-03-30 ENCOUNTER — Ambulatory Visit (INDEPENDENT_AMBULATORY_CARE_PROVIDER_SITE_OTHER): Payer: Medicare Other | Admitting: Nurse Practitioner

## 2021-03-30 VITALS — BP 136/88 | HR 83 | Ht 65.0 in | Wt 185.0 lb

## 2021-03-30 DIAGNOSIS — E0590 Thyrotoxicosis, unspecified without thyrotoxic crisis or storm: Secondary | ICD-10-CM | POA: Diagnosis not present

## 2021-03-30 NOTE — Progress Notes (Signed)
03/30/2021     Endocrinology Follow Up Note    Subjective:    Patient ID: [name] Barnett, female    DOB: 1980-10-22, PCP Sitafalwalla, [place], DO.   Past Medical History:  Diagnosis Date   Diabetes mellitus without complication ([place])    Hyperlipidemia    Hypertension    Renal disorder    Stroke [facility])    Vitamin D deficiency     Past Surgical History:  Procedure Laterality Date   cardiac stents     CATARACT EXTRACTION W/PHACO Left 08/29/2020   Procedure: CATARACT EXTRACTION PHACO AND INTRAOCULAR LENS PLACEMENT LEFT EYE;  Surgeon: Baruch Goldmann, MD;  Location: [facility];  Service: Ophthalmology;  Laterality: Left;  left CDE=29.30   CATARACT EXTRACTION W/PHACO Right 11/28/2020   Procedure: CATARACT EXTRACTION PHACO AND INTRAOCULAR LENS PLACEMENT (IOC);  Surgeon: Baruch Goldmann, MD;  Location: [facility];  Service: Ophthalmology;  Laterality: Right;  CDE 4.35   COLOSTOMY     TUBAL LIGATION     wound on buttocks      Social History   Socioeconomic History   Marital status: Legally Separated    Spouse name: Not on file   Number of children: Not on file   Years of education: Not on file   Highest education level: Not on file  Occupational History   Not on file  Tobacco Use   Smoking status: Never   Smokeless tobacco: Never  Vaping Use   Vaping Use: Never used  Substance and Sexual Activity   Alcohol use: Never   Drug use: Never   Sexual activity: Not Currently    Birth control/protection: Surgical    Comment: tubal  Other Topics Concern   Not on file  Social History Narrative   Not on file   Social Determinants of Health   Financial Resource Strain: Low Risk    Difficulty of Paying Living Expenses: Not very hard  Food Insecurity: No Food Insecurity   Worried About Running Out of Food in the Last Year: Never true   [place] in the Last Year: Never true  Transportation Needs: No Transportation Needs   Lack of Transportation (Medical): No    Lack of Transportation (Non-Medical): No  Physical Activity: Insufficiently Active   Days of Exercise per Week: 3 days   Minutes of Exercise per Session: 30 min  Stress: No Stress Concern Present   Feeling of Stress : Not at all  Social Connections: Moderately Isolated   Frequency of Communication with Friends and Family: More than three times a week   Frequency of Social Gatherings with Friends and Family: Never   Attends Religious Services: More than 4 times per year   Active Member of [employer] or Organizations: No   Attends Archivist Meetings: Never   Marital Status: Separated    Family History  Problem Relation Age of Onset   Cancer Paternal Grandmother    Heart disease Father    Hypertension Father    Diabetes Father    Glaucoma Father    Heart disease Mother    Hypertension Mother    Diabetes Mother    Diabetes Sister        borderline   Hypertension Sister     Outpatient Encounter Medications as of 03/30/2021  Medication Sig   acetaminophen (TYLENOL) 325 MG tablet Take 650 mg by mouth every 6 (six) hours as needed.   amLODipine (NORVASC) 5 MG tablet Take 5 mg by mouth  See admin instructions. Sun, Mon, Wed and Fri   ascorbic acid (VITAMIN C) 500 MG tablet Take 500 mg by mouth 2 (two) times daily.   aspirin EC 81 MG tablet Take 81 mg by mouth daily. Swallow whole.   atenolol (TENORMIN) 25 MG tablet Take 0.5 tablets (12.5 mg total) by mouth daily. (Patient taking differently: Take 12.5 mg by mouth See admin instructions. Every Sun, Mon, Wed and Fri)   atorvastatin (LIPITOR) 80 MG tablet Take 1 tablet by mouth at bedtime.   b complex-vitamin c-folic acid (NEPHRO-VITE) 0.8 MG TABS tablet Take 1 tablet by mouth daily.   Biotin 5 MG TABS Take 5 mg by mouth in the morning and at bedtime.   busPIRone (BUSPAR) 5 MG tablet Take 5 mg by mouth 2 (two) times daily.   calcium carbonate (TUMS - DOSED IN MG ELEMENTAL CALCIUM) 500 MG chewable tablet Chew 1,000 mg by mouth 3  (three) times daily. Before meals   cetaphil (CETAPHIL) lotion Apply 1 application topically 2 (two) times daily as needed for dry skin.   Cholecalciferol (VITAMIN D) 50 MCG (2000 UT) CAPS Take 2,000 Units by mouth daily.   dicyclomine (BENTYL) 10 MG capsule Take 10 mg by mouth 3 (three) times daily before meals.   HUMALOG 100 UNIT/ML injection Inject 5 Units into the skin 3 (three) times daily before meals.   HYDROcodone-acetaminophen (NORCO/VICODIN) 5-325 MG tablet Take 1 tablet by mouth 2 (two) times daily as needed. (Patient not taking: No sig reported)   LANTUS 100 UNIT/ML injection Inject 10 Units into the skin in the morning.   Lidocaine 4 % PTCH Apply 1 application topically daily at 6 (six) AM.   lidocaine-prilocaine (EMLA) cream Apply 1 application topically daily as needed. Apply to HD graft site right arm topically one every tues, thurs, sat for pain.   melatonin 3 MG TABS tablet Take 3 mg by mouth at bedtime.   Multiple Vitamins-Minerals (CERTAGEN PO) Take 1 tablet by mouth in the morning.   nepafenac (NEVANAC) 0.1 % ophthalmic suspension Place 1 drop into the right eye daily. (Patient not taking: No sig reported)   nystatin cream (MYCOSTATIN) Apply topically.   ondansetron (ZOFRAN ODT) 4 MG disintegrating tablet Take 1 tablet (4 mg total) by mouth every 8 (eight) hours as needed for nausea or vomiting.   oxyCODONE-acetaminophen (PERCOCET) 5-325 MG tablet Take 1 tablet by mouth every 4 (four) hours as needed.   pantoprazole (PROTONIX) 40 MG tablet Take 40 mg by mouth in the morning.   polyethylene glycol (MIRALAX / GLYCOLAX) 17 g packet Take 17 g by mouth daily.   prednisoLONE acetate (PRED FORTE) 1 % ophthalmic suspension Place 1 drop into the right eye 2 (two) times daily. (Patient not taking: Reported on 03/09/2021)   rOPINIRole (REQUIP) 0.5 MG tablet Take 0.5 mg by mouth at bedtime.   rOPINIRole (REQUIP) 2 MG tablet Take 2 mg by mouth at bedtime.   sennosides-docusate sodium  (SENOKOT-S) 8.6-50 MG tablet Take 2 tablets by mouth daily.   tamsulosin (FLOMAX) 0.4 MG CAPS capsule Take 1 capsule (0.4 mg total) by mouth daily.   thiamine 100 MG tablet Take 100 mg by mouth daily.   tiZANidine (ZANAFLEX) 4 MG tablet Take 4 mg by mouth at bedtime.   traMADol (ULTRAM) 50 MG tablet Take 100 mg by mouth every 6 (six) hours as needed.   No facility-administered encounter medications on file as of 03/30/2021.    ALLERGIES: Allergies  Allergen Reactions  Corn-Containing Products     Per MAR   Lisinopril     Per MAR   Morphine And Related     Per MAR    VACCINATION STATUS:  There is no immunization history on file for this patient.   HPI  Tiffanni Angeliki Mates is 40 y.o. female who presents today with a medical history as above. she is being seen in follow up after being seen in consultation for hyperthyroidism requested by Sitafalwalla, [place], DO.  she has been dealing with symptoms of intermittent palpitations, tremors, insomnia, and intermittent diarrhea for a few months now. These symptoms are progressively worsening and troubling to her.  her most recent thyroid labs revealed suppressed TSH of 0.198, T4 of 9.4, T3 uptake of 46, and Free Thyroxine Index of 4.3 on 08/14/20. she denies dysphagia, choking, shortness of breath, no recent voice change.    she reports family history of thyroid dysfunction in her mother (unsure of what kind), but denies family hx of thyroid cancer. she does have personal history of goiter. she is not on any anti-thyroid medications nor on any thyroid hormone supplements.  she is willing to proceed with appropriate work up and therapy for thyrotoxicosis.  After reviewing her med list from the nursing home, it is noted that she is on Biotin supplement daily.  She is also on Atenolol daily which could be masking her symptoms.   Review of systems  Constitutional: + Minimally fluctuating body weight,  current Body mass index is 30.79 kg/m.  , no fatigue, no subjective hyperthermia, no subjective hypothermia Eyes: no blurry vision, no xerophthalmia ENT: no sore throat, no nodules palpated in throat, no dysphagia/odynophagia, no hoarseness Cardiovascular: no chest pain, no shortness of breath, no palpitations, no leg swelling Respiratory: no cough, no shortness of breath Gastrointestinal: no nausea/vomiting/diarrhea Musculoskeletal: no muscle/joint aches Skin: no rashes, no hyperemia Neurological: no tremors, no numbness, no tingling, no dizziness Psychiatric: no depression, no anxiety, reports increased sexual arousal-continued   Objective:    BP 136/88   Pulse 83   Ht 5\' 5"  (1.651 m)   Wt 185 lb (83.9 kg)   BMI 30.79 kg/m   Wt Readings from Last 3 Encounters:  03/30/21 185 lb (83.9 kg)  03/09/21 185 lb 6.4 oz (84.1 kg)  02/18/21 187 lb (84.8 kg)     BP Readings from Last 3 Encounters:  03/30/21 136/88  03/09/21 138/90  02/23/21 126/78                       Physical Exam- Limited  Constitutional:  Body mass index is 30.79 kg/m. , not in acute distress, normal state of mind Eyes:  EOMI, no exophthalmos Neck: Supple Cardiovascular: RRR, no murmurs, rubs, or gallops, 2+ pitting edema to BLE Respiratory: Adequate breathing efforts, no crackles, rales, rhonchi, or wheezing Musculoskeletal: no gross deformities, strength intact in all four extremities, no gross restriction of joint movements, in wc due to previous CVA Skin:  no rashes, no hyperemia Neurological: no tremor with outstretched hands    CMP     Component Value Date/Time   NA 139 12/01/2020 1753   K 4.6 12/01/2020 1753   CL 97 (L) 12/01/2020 1753   CO2 22 12/01/2020 1753   GLUCOSE 104 (H) 12/01/2020 1753   BUN 58 (H) 12/01/2020 1753   CREATININE 8.68 (H) 12/01/2020 1753   CALCIUM 9.1 12/01/2020 1753   PROT 9.3 (H) 12/01/2020 1753   ALBUMIN 4.4 12/01/2020 1753  AST 8 (L) 12/01/2020 1753   ALT 17 12/01/2020 1753   ALKPHOS 148 (H)  12/01/2020 1753   BILITOT 0.9 12/01/2020 1753   GFRNONAA 5 (L) 12/01/2020 1753   GFRAA 7 (L) 01/16/2020 0617     CBC    Component Value Date/Time   WBC 14.6 (H) 12/01/2020 1753   RBC 4.09 12/01/2020 1753   HGB 12.2 12/01/2020 1753   HCT 39.0 12/01/2020 1753   PLT 396 12/01/2020 1753   MCV 95.4 12/01/2020 1753   MCH 29.8 12/01/2020 1753   MCHC 31.3 12/01/2020 1753   RDW 14.6 12/01/2020 1753   LYMPHSABS 0.9 12/01/2020 1753   MONOABS 0.5 12/01/2020 1753   EOSABS 0.0 12/01/2020 1753   BASOSABS 0.0 12/01/2020 1753     Diabetic Labs (most recent): Lab Results  Component Value Date   HGBA1C 8.1 03/25/2021   HGBA1C 7.1 (H) 01/13/2020   HGBA1C 6.2 (H) 02/08/2019    Lipid Panel  No results found for: CHOL, TRIG, HDL, CHOLHDL, VLDL, LDLCALC, LDLDIRECT, LABVLDL   Lab Results  Component Value Date   TSH 1.55 03/25/2021   TSH 0.06 (A) 02/11/2021   TSH 0.61 09/02/2020   TSH 0.20 (A) 08/14/2020    09/02/20 0000   Result status: Final  Resulting lab: [facility]  Reference range: 0.41 - 5.90  Value: 0.61  Comment: T4,Free 1.49, Anti-Thyroglobulin Antibodies <1.0   Uptake and Scan from 09/18/20 CLINICAL DATA:  Difficulty sleeping, hair loss, increased thirst, heart palpitations, fatigue, weakness, edema suppressed TSH, suspected hyperthyroidism   EXAM: THYROID SCAN AND UPTAKE - 4 AND 24 HOURS   TECHNIQUE: Following oral administration of I-123 capsule, anterior planar imaging was acquired at 24 hours. Thyroid uptake was calculated with a thyroid probe at 4-6 hours and 24 hours.   RADIOPHARMACEUTICALS:  310 uCi I-123 sodium iodide p.o.   COMPARISON:  None   FINDINGS: Homogeneous tracer distribution in both thyroid lobes.   No focal areas of increased or decreased tracer localization.   4 hour I-123 uptake = 4.3% (normal 5-20%)   24 hour I-123 uptake = 10.5% (normal 10-30%)   IMPRESSION: Normal thyroid scan.   Low normal 24 hour radio iodine uptake.      Electronically Signed   By: Lavonia Dana M.D.   On: 09/18/2020 11:03    Assessment & Plan:   1. Hyperthyroidism- recurrent acute thyroiditis  she is being seen at a kind request of [place], [place], DO.  She denies any specific symptoms of over-active thyroid (possibly due to her beta-blocker use).   She did have positive TPO antibody testing which increases her risk of developing Hashimoto's or Graves disease, however given her negative uptake and scan back in May 2022, this suggests thyroiditis as well.  She has completed prednisone boost and her repeat thyroid function tests are now normal.  Will repeat thyroid function tests in 3 months for surveillance.  She states she may be moving to [place], [place] between now and then.  I discussed the importance of getting established with endocrinologist in that area ASAP and we can send our records there for ongoing evaluation and treatment.   -Patient is advised to maintain close follow up with [place], [place], DO for primary care needs.    I spent 20 minutes in the care of the patient today including review of labs from Thyroid Function, CMP, and other relevant labs ; imaging/biopsy records (current and previous including abstractions from other facilities); face-to-face time discussing  her lab results  and symptoms, medications doses, her options of short and long term treatment based on the latest standards of care / guidelines;   and documenting the encounter.  [place]  participated in the discussions, expressed understanding, and voiced agreement with the above plans.  All questions were answered to her satisfaction. she is encouraged to contact clinic should she have any questions or concerns prior to her return visit.  Follow up plan: Return in about 3 months (around 06/30/2021) for Thyroid follow up, Previsit labs.   Thank you for involving me in the care of this pleasant patient, and I will continue to  update you with her progress.  Rayetta Pigg, [facility] [address] [place], [place] [address] Phone: [phone] Fax: [phone]  03/30/2021, 3:00 PM

## 2021-04-01 ENCOUNTER — Other Ambulatory Visit: Payer: Self-pay

## 2021-04-01 ENCOUNTER — Encounter (HOSPITAL_COMMUNITY): Payer: Self-pay | Admitting: Physical Therapy

## 2021-04-01 ENCOUNTER — Ambulatory Visit (HOSPITAL_COMMUNITY): Payer: Medicare Other | Attending: Emergency Medicine | Admitting: Physical Therapy

## 2021-04-01 ENCOUNTER — Encounter (HOSPITAL_BASED_OUTPATIENT_CLINIC_OR_DEPARTMENT_OTHER): Payer: Medicare Other | Admitting: Internal Medicine

## 2021-04-01 DIAGNOSIS — I890 Lymphedema, not elsewhere classified: Secondary | ICD-10-CM | POA: Diagnosis not present

## 2021-04-01 NOTE — Progress Notes (Signed)
[name] Barnett, RADI ([id number]) Visit Report for 04/01/2021 Arrival Information Details Patient Name: Date of Service: [name] Barnett, [name] Barnett 04/01/2021 12:45 PM Medical Record Number: [id number] Patient Account Number: [id number] Date of Birth/Sex: Treating RN: 1980-07-10 (40 y.o. [name], Lauren Primary Care Nadalyn Deringer: SITA FA LWA LLA, SHA RMIN Other Clinician: Referring Jazmine Longshore: Treating Esparanza Krider/Extender: Linton Ham SITA FA LWA LLA, SHA RMIN Weeks in Treatment: 13 Visit Information History Since Last Visit Added or deleted any medications: No Patient Arrived: Wheel Chair Any new allergies or adverse reactions: No Arrival Time: 12:51 Had a fall or experienced change in No Accompanied By: self activities of daily living that may affect Transfer Assistance: Manual risk of falls: Patient Identification Verified: Yes Signs or symptoms of abuse/neglect since last visito No Secondary Verification Process Completed: Yes Hospitalized since last visit: No Patient Requires Transmission-Based Precautions: No Implantable device outside of the clinic excluding No Patient Has Alerts: No cellular tissue based products placed in the center since last visit: Has Dressing in Place as Prescribed: Yes Pain Present Now: Yes Electronic Signature(s) Signed: 04/01/2021 5:36:01 PM By: Rhae Hammock RN Entered By: Rhae Hammock on 04/01/2021 12:52:05 -------------------------------------------------------------------------------- Clinic Level of Care Assessment Details Patient Name: Date of Service: [name] Barnett 04/01/2021 12:45 PM Medical Record Number: [id number] Patient Account Number: [id number] Date of Birth/Sex: Treating RN: 05/06/1980 (40 y.o. [name], Lauren Primary Care Chaun Uemura: SITA FA LWA LLA, SHA RMIN Other Clinician: Referring Swara Donze: Treating Emilygrace Grothe/Extender: Linton Ham SITA FA LWA LLA, SHA RMIN Weeks in Treatment: 13 Clinic Level of  Care Assessment Items TOOL 4 Quantity Score X- 1 0 Use when only an EandM is performed on FOLLOW-UP visit ASSESSMENTS - Nursing Assessment / Reassessment X- 1 10 Reassessment of Co-morbidities (includes updates in patient status) X- 1 5 Reassessment of Adherence to Treatment Plan ASSESSMENTS - Wound and Skin A ssessment / Reassessment X - Simple Wound Assessment / Reassessment - one wound 1 5 []  - 0 Complex Wound Assessment / Reassessment - multiple wounds []  - 0 Dermatologic / Skin Assessment (not related to wound area) ASSESSMENTS - Focused Assessment []  - 0 Circumferential Edema Measurements - multi extremities []  - 0 Nutritional Assessment / Counseling / Intervention []  - 0 Lower Extremity Assessment (monofilament, tuning fork, pulses) []  - 0 Peripheral Arterial Disease Assessment (using hand held doppler) ASSESSMENTS - Ostomy and/or Continence Assessment and Care []  - 0 Incontinence Assessment and Management []  - 0 Ostomy Care Assessment and Management (repouching, etc.) PROCESS - Coordination of Care X - Simple Patient / Family Education for ongoing care 1 15 []  - 0 Complex (extensive) Patient / Family Education for ongoing care X- 1 10 Staff obtains Programmer, systems, Records, T Results / Process Orders est X- 1 10 Staff telephones HHA, Nursing Homes / Clarify orders / etc []  - 0 Routine Transfer to another Facility (non-emergent condition) []  - 0 Routine Hospital Admission (non-emergent condition) []  - 0 New Admissions / Biomedical engineer / Ordering NPWT Apligraf, etc. , []  - 0 Emergency Hospital Admission (emergent condition) X- 1 10 Simple Discharge Coordination []  - 0 Complex (extensive) Discharge Coordination PROCESS - Special Needs []  - 0 Pediatric / Minor Patient Management []  - 0 Isolation Patient Management []  - 0 Hearing / Language / Visual special needs []  - 0 Assessment of Community assistance (transportation, D/C planning, etc.) []  -  0 Additional assistance / Altered mentation []  - 0 Support Surface(s) Assessment (bed, cushion, seat, etc.) INTERVENTIONS - Wound Cleansing / Measurement X - Simple  Wound Cleansing - one wound 1 5 []  - 0 Complex Wound Cleansing - multiple wounds X- 1 5 Wound Imaging (photographs - any number of wounds) []  - 0 Wound Tracing (instead of photographs) X- 1 5 Simple Wound Measurement - one wound []  - 0 Complex Wound Measurement - multiple wounds INTERVENTIONS - Wound Dressings X - Small Wound Dressing one or multiple wounds 1 10 []  - 0 Medium Wound Dressing one or multiple wounds []  - 0 Large Wound Dressing one or multiple wounds []  - 0 Application of Medications - topical []  - 0 Application of Medications - injection INTERVENTIONS - Miscellaneous []  - 0 External ear exam X- 1 5 Specimen Collection (cultures, biopsies, blood, body fluids, etc.) X- 1 5 Specimen(s) / Culture(s) sent or taken to Lab for analysis []  - 0 Patient Transfer (multiple staff / Civil Service fast streamer / Similar devices) []  - 0 Simple Staple / Suture removal (25 or less) []  - 0 Complex Staple / Suture removal (26 or more) []  - 0 Hypo / Hyperglycemic Management (close monitor of Blood Glucose) []  - 0 Ankle / Brachial Index (ABI) - do not check if billed separately X- 1 5 Vital Signs Has the patient been seen at the hospital within the last three years: Yes Total Score: 105 Level Of Care: New/Established - Level 3 Electronic Signature(s) Signed: 04/01/2021 5:36:01 PM By: Rhae Hammock RN Entered By: Rhae Hammock on 04/01/2021 13:37:36 -------------------------------------------------------------------------------- Encounter Discharge Information Details Patient Name: Date of Service: [name] Barnett 04/01/2021 12:45 PM Medical Record Number: [id number] Patient Account Number: [id number] Date of Birth/Sex: Treating RN: Oct 20, 1980 (40 y.o. [name] Primary Care Alper Guilmette: SITA FA LWA  LLA, SHA RMIN Other Clinician: Referring Preslee Regas: Treating Mahrukh Seguin/Extender: Linton Ham SITA FA LWA LLA, SHA RMIN Weeks in Treatment: 13 Encounter Discharge Information Items Discharge Condition: Stable Ambulatory Status: Wheelchair Discharge Destination: Home Transportation: Private Auto Accompanied By: self Schedule Follow-up Appointment: Yes Clinical Summary of Care: Patient Declined Electronic Signature(s) Signed: 04/01/2021 5:36:01 PM By: Rhae Hammock RN Entered By: Rhae Hammock on 04/01/2021 13:38:16 -------------------------------------------------------------------------------- Lower Extremity Assessment Details Patient Name: Date of Service: [name] DA KAYLAN, YATES 04/01/2021 12:45 PM Medical Record Number: [id number] Patient Account Number: [id number] Date of Birth/Sex: Treating RN: 07-14-1980 (40 y.o. [name] Primary Care Jilleen Essner: SITA FA Janace Aris, SHA RMIN Other Clinician: Referring Shavaughn Seidl: Treating Leianna Barga/Extender: Linton Ham SITA FA LWA LLA, SHA RMIN Weeks in Treatment: 13 Electronic Signature(s) Signed: 04/01/2021 5:36:01 PM By: Rhae Hammock RN Entered By: Rhae Hammock on 04/01/2021 12:53:12 -------------------------------------------------------------------------------- Multi Wound Chart Details Patient Name: Date of Service: [name] DA EASTON, SIVERTSON 04/01/2021 12:45 PM Medical Record Number: [id number] Patient Account Number: [id number] Date of Birth/Sex: Treating RN: 1981/01/07 (40 y.o. [name] Primary Care Anae Hams: SITA FA LWA LLA, SHA RMIN Other Clinician: Referring Quy Lotts: Treating Emarion Toral/Extender: Linton Ham SITA FA LWA LLA, SHA RMIN Weeks in Treatment: 13 Vital Signs Height(in): Capillary Blood Glucose(mg/dl): 145 Weight(lbs): Pulse(bpm): 62 Body Mass Index(BMI): Blood Pressure(mmHg): 141/74 Temperature(F): 98.7 Respiratory Rate(breaths/min): 17 Photos: [N/A:N/A] Perineum  N/A N/A Wound Location: Pressure Injury N/A N/A Wounding Event: Pressure Ulcer N/A N/A Primary Etiology: Cataracts, Glaucoma, Anemia, N/A N/A Comorbid History: Congestive Heart Failure, Hypertension, Type II Diabetes, End Stage Renal Disease, Neuropathy, Confinement Anxiety 02/04/2021 N/A N/A Date Acquired: 8 N/A N/A Weeks of Treatment: Open N/A N/A Wound Status: 3.5x1.3x0.2 N/A N/A Measurements L x W x D (cm) 3.574 N/A N/A A (cm) : rea 0.715 N/A N/A Volume (cm) : -103.20%  N/A N/A % Reduction in A rea: -103.10% N/A N/A % Reduction in Volume: Category/Stage II N/A N/A Classification: Medium N/A N/A Exudate A mount: Serosanguineous N/A N/A Exudate Type: red, brown N/A N/A Exudate Color: Flat and Intact N/A N/A Wound Margin: Large (67-100%) N/A N/A Granulation A mount: Pink N/A N/A Granulation Quality: Small (1-33%) N/A N/A Necrotic A mount: Fat Layer (Subcutaneous Tissue): Yes N/A N/A Exposed Structures: Fascia: No Tendon: No Muscle: No Joint: No Bone: No None N/A N/A Epithelialization: Treatment Notes Electronic Signature(s) Signed: 04/01/2021 4:53:46 PM By: Linton Ham MD Signed: 04/01/2021 5:36:01 PM By: Rhae Hammock RN Entered By: Linton Ham on 04/01/2021 13:28:59 -------------------------------------------------------------------------------- Multi-Disciplinary Care Plan Details Patient Name: Date of Service: [name] DA NATALIJA, MAVIS 04/01/2021 12:45 PM Medical Record Number: [id number] Patient Account Number: [id number] Date of Birth/Sex: Treating RN: Oct 11, 1980 (40 y.o. [name] Primary Care Adeja Sarratt: SITA FA LWA LLA, SHA RMIN Other Clinician: Referring Riddick Nuon: Treating Shamir Tuzzolino/Extender: Linton Ham SITA FA LWA LLA, SHA RMIN Weeks in Treatment: 13 Active Inactive Wound/Skin Impairment Nursing Diagnoses: Impaired tissue integrity Knowledge deficit related to ulceration/compromised skin  integrity Goals: Patient will have a decrease in wound volume by X% from date: (specify in notes) Date Initiated: 12/31/2020 Date Inactivated: 02/04/2021 Target Resolution Date: 01/07/2021 Goal Status: Met Patient/caregiver will verbalize understanding of skin care regimen Date Initiated: 12/31/2020 Target Resolution Date: 04/04/2021 Goal Status: Active Ulcer/skin breakdown will have a volume reduction of 30% by week 4 Date Initiated: 12/31/2020 Date Inactivated: 01/14/2021 Target Resolution Date: 01/10/2021 Goal Status: Met Ulcer/skin breakdown will have a volume reduction of 50% by week 8 Date Initiated: 12/31/2020 Date Inactivated: 01/14/2021 Target Resolution Date: 01/23/2021 Goal Status: Met Ulcer/skin breakdown will have a volume reduction of 80% by week 12 Date Initiated: 12/31/2020 Date Inactivated: 02/04/2021 Target Resolution Date: 02/18/2021 Goal Status: Met Interventions: Assess patient/caregiver ability to obtain necessary supplies Assess patient/caregiver ability to perform ulcer/skin care regimen upon admission and as needed Assess ulceration(s) every visit Notes: Electronic Signature(s) Signed: 04/01/2021 5:36:01 PM By: Rhae Hammock RN Entered By: Rhae Hammock on 04/01/2021 13:05:36 -------------------------------------------------------------------------------- Pain Assessment Details Patient Name: Date of Service: [name] DA ELLIE, BRYAND 04/01/2021 12:45 PM Medical Record Number: [id number] Patient Account Number: [id number] Date of Birth/Sex: Treating RN: 1980/11/07 (40 y.o. [name] Primary Care Zohra Clavel: SITA FA LWA LLA, SHA RMIN Other Clinician: Referring Charlissa Petros: Treating Britanie Harshman/Extender: Linton Ham SITA FA LWA LLA, SHA RMIN Weeks in Treatment: 13 Active Problems Location of Pain Severity and Description of Pain Patient Has Paino Yes Site Locations Pain Location: Pain Location: Generalized Pain, Pain in Ulcers With Dressing  Change: Yes Duration of the Pain. Constant / Intermittento Intermittent Rate the pain. Current Pain Level: 9 Worst Pain Level: 10 Least Pain Level: 0 Tolerable Pain Level: 9 Character of Pain Describe the Pain: Aching Pain Management and Medication Current Pain Management: Medication: Yes Cold Application: No Rest: No Massage: No Activity: No T.E.N.S.: No Heat Application: No Leg drop or elevation: No Is the Current Pain Management Adequate: Adequate How does your wound impact your activities of daily livingo Sleep: No Bathing: No Appetite: No Relationship With Others: No Bladder Continence: No Emotions: No Bowel Continence: No Work: No Toileting: No Drive: No Dressing: No Hobbies: No Electronic Signature(s) Signed: 04/01/2021 5:36:01 PM By: Rhae Hammock RN Entered By: Rhae Hammock on 04/01/2021 12:53:07 -------------------------------------------------------------------------------- Patient/Caregiver Education Details Patient Name: Date of Service: [name] DA Kirby Crigler 11/30/2022andnbsp12:45 PM Medical Record Number: [id number] Patient Account Number: [id number] Date of Birth/Gender: Treating  RN: 11/09/1980 (40 y.o. [name] Primary Care Physician: SITA FA LWA LLA, SHA RMIN Other Clinician: Referring Physician: Treating Physician/Extender: Linton Ham SITA FA LWA LLA, SHA RMIN Weeks in Treatment: 13 Education Assessment Education Provided To: Patient Education Topics Provided Basic Hygiene: Methods: Explain/Verbal Responses: Reinforcements needed Electronic Signature(s) Signed: 04/01/2021 5:36:01 PM By: Rhae Hammock RN Entered By: Rhae Hammock on 04/01/2021 13:36:57 -------------------------------------------------------------------------------- Wound Assessment Details Patient Name: Date of Service: [name] DA SANOE, HAZAN 04/01/2021 12:45 PM Medical Record Number: [id number] Patient Account Number:  [id number] Date of Birth/Sex: Treating RN: December 14, 1980 (40 y.o. [name], Lauren Primary Care Veleda Mun: SITA FA LWA LLA, SHA RMIN Other Clinician: Referring Paeton Latouche: Treating Enio Hornback/Extender: Linton Ham SITA FA LWA LLA, SHA RMIN Weeks in Treatment: 13 Wound Status Wound Number: 6 Primary Pressure Ulcer Etiology: Wound Location: Perineum Wound Open Wounding Event: Pressure Injury Status: Date Acquired: 02/04/2021 Comorbid Cataracts, Glaucoma, Anemia, Congestive Heart Failure, Weeks Of Treatment: 8 History: Hypertension, Type II Diabetes, End Stage Renal Disease, Clustered Wound: No Neuropathy, Confinement Anxiety Photos Wound Measurements Length: (cm) 3.5 Width: (cm) 1.3 Depth: (cm) 0.2 Area: (cm) 3.574 Volume: (cm) 0.715 % Reduction in Area: -103.2% % Reduction in Volume: -103.1% Epithelialization: None Tunneling: No Undermining: No Wound Description Classification: Category/Stage II Wound Margin: Flat and Intact Exudate Amount: Medium Exudate Type: Serosanguineous Exudate Color: red, brown Foul Odor After Cleansing: No Slough/Fibrino No Wound Bed Granulation Amount: Large (67-100%) Exposed Structure Granulation Quality: Pink Fascia Exposed: No Necrotic Amount: Small (1-33%) Fat Layer (Subcutaneous Tissue) Exposed: Yes Necrotic Quality: Adherent Slough Tendon Exposed: No Muscle Exposed: No Joint Exposed: No Bone Exposed: No Treatment Notes Wound #6 (Perineum) Cleanser Wound Cleanser Discharge Instruction: Cleanse the wound with wound cleanser prior to applying a clean dressing using gauze sponges, not tissue or cotton balls. Peri-Wound Care Skin Prep Discharge Instruction: Use skin prep as directed Topical Primary Dressing AquacelAg Advantage Dressing, 2X2 (in/in) Secondary Dressing Zetuvit Plus Silicone Border Dressing 4x4 (in/in) Discharge Instruction: Apply silicone border over primary dressing as directed. Secured With Compression  Wrap Compression Stockings Environmental education officer) Signed: 04/01/2021 5:36:01 PM By: Rhae Hammock RN Signed: 04/01/2021 5:48:07 PM By: Dellie Catholic RN Entered By: Dellie Catholic on 04/01/2021 13:06:11 -------------------------------------------------------------------------------- Vitals Details Patient Name: Date of Service: [name] DA TEOFILA, BOWERY 04/01/2021 12:45 PM Medical Record Number: [id number] Patient Account Number: [id number] Date of Birth/Sex: Treating RN: 05-15-1980 (40 y.o. [name], Lauren Primary Care Tedi Hughson: SITA FA LWA LLA, SHA RMIN Other Clinician: Referring Venecia Mehl: Treating Marin Milley/Extender: Linton Ham SITA FA LWA LLA, SHA RMIN Weeks in Treatment: 13 Vital Signs Time Taken: 12:50 Temperature (F): 98.7 Pulse (bpm): 74 Respiratory Rate (breaths/min): 17 Blood Pressure (mmHg): 141/74 Capillary Blood Glucose (mg/dl): 145 Reference Range: 80 - 120 mg / dl Electronic Signature(s) Signed: 04/01/2021 5:36:01 PM By: Rhae Hammock RN Entered By: Rhae Hammock on 04/01/2021 12:52:33

## 2021-04-01 NOTE — Progress Notes (Signed)
[name] KAMMY, KLETT ([id number]) Visit Report for 04/01/2021 HPI Details Patient Name: Date of Service: [name] Barnett, [name] Barnett 04/01/2021 12:45 PM Medical Record Number: [id number] Patient Account Number: [id number] Date of Birth/Sex: Treating RN: [birth year] (40 y.o. [name] Primary Care Provider: SITA FA LWA LLA, SHA RMIN Other Clinician: Referring Provider: Treating Provider/Extender: Linton Ham SITA FA LWA LLA, SHA RMIN Weeks in Treatment: 13 History of Present Illness HPI Description: Admission 06/15/2019 This is a 40 year old woman who comes to [place] from [facility] skilled facility in [place]. I am able to follow a lot of her history on care everywhere in epic. The patient is a type II diabetic and has been on dialysis. 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 I am not exactly sure of the nature of the stroke at this present time. She has made a decent recovery and is now up walking with a walker. In August 2020 she was admitted to [facility] in [place] with L4-L5 discitis. Culture and sensitivity showed Klebsiella. She was discharged on 8 weeks of ceftazidime. In mid September she is readmitted to the hospital with a pressure ulcer on her lower sacrum and necrotizing fasciitis. She required an extensive surgical debridement. CT scan of the pelvis at 1 point showed communication with the rectum. A colostomy was recommended and placed. Culture of this area showed Klebsiella and yeast. She was discharged on an extended course of meropenem and Diflucan. [facility] [place] We do not have any of these records. I think she was discharged to another nursing home but is come to [facility] skilled facility in [facility]. She was sent down here for review of the remaining wound. By review of the pictures that the patient had from  September this is made considerable improvement. She tells me that the wound VAC was discontinued at the end of December 2020. Currently the facility is using a form of silver alginate to the wound surface. Past medical history type 2 diabetes with chronic renal failure on dialysis Tuesday Thursday and Saturday. CVA with left hemiparesis in June 2020 but she appears to be making a good recovery. Chronic diastolic heart failure, hypertension 2/19; patient's wound area slightly smaller. The area that was towards her anus has closed over. We are using Santyl covered with Hydrofera Blue. We have not been able to get her lab work apparently her veins are very difficult for phlebotomy. They are trying to get the blood work done where she dialyzes in [facility] but that requires some logistical issues. 2/26; not much change in the wound. Still a nonviable surface we have been using Santyl and Hydrofera Blue. I changed her to [facility] only. She tells me she has rigorously offloading this area she also complains of some pain 3/5; still no change here. I switch to pure Hydrofera Blue. She still complains of a lot of pain although is a big wound it seems out of proportion to what I might expect. This was initially a surgical wound secondary to necrotizing wound infection. She is on dialysis. Other than that she thinks she is offloading this fairly rigorously. She has not been systemically unwell 3/26; I switched to Iodoflex 3 weeks ago. This really seems to have helped, much better surface and slight improvement in the surface area. 4/26; the patient is still on Iodoflex she missed an appointment 2 weeks ago.  Doing slightly smaller. She is on dialysis 5/10; using Hydrofera Blue. The major improvement here is the complete lack of depth. She has islands of epithelialization 09/24/19-Patient comes in at 2 weeks, dimensions about the same, there is an [place] of epithelium at margin of wound, no slough  noted 10/15/2019 upon evaluation today patient appears to be doing well with regard to her wound. She has been tolerating the dressing changes in the sacral region without complication and at this point she seems to be healing quite nicely. I am extremely pleased with how things are progressing and the epithelial tissue seems to be spreading from the central portion as well which is also excellent. 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 We have been using Hydrofera Blue. The smaller area which has depth at 0.7 cm there is a question about whether they have been packing anything into this or just flaring Hydrofera Blue over the top 9/23; its been more than 2 months since this patient was here. She still has a small area distally which is the remanent of her stage IV wound. They have been using Hydrofera Blue. She also has an area on the left gluteal 10/22; 1 month follow-up. I think the original sacral wound is closed however the patient has 2 areas distally 1 over the coccyx a small pinpoint area and the other into the gluteal cleft. Neither 1 of these looks particularly infected. I thought they might be connected but they were not. 11/15; the original sacral wound is closed patient only has 1 remaining area. 1 of these is in the gluteal cleft just below the coccyx. The area over the coccyx from last time I think is all so closed 12/31/2020; this is a patient that we saw for a pressure ulcer over her lower sacrum and coccyx for a long period of time in 2021. She eventually healed over. She is at [facility] skilled facility largely secondary to a remote CVA she had. She is also end-stage renal disease on dialysis secondary to type 2 diabetes. She tells [place] that since about mid June she has had an area more distally than the original sacrum/coccyx wound it looks as though they are putting silver alginate in this area. She is limited in her  ability to offload this I think spending most of her time in a wheelchair although she can stand to transfer. She has an ostomy During her original stay here in 2021 I also list that she had a more distal area into her perineum that closed over well before the more proximal coccyx/sacral wound. She had a CT scan of the abdomen and pelvis on 12/01/2020. This did note first chronic sacral decubitus along the right aspect of her distal sacrum and coccyx. There was not felt to be bone destruction she has chronic changes of bilateral sacroiliitis and chronic endplate destruction and sclerosis at L4-L5 secondary to previous osteomyelitis however no new findings were felt to be seen in terms of bone problems in this area 01/14/2021; 2-week follow-up. Her wound is smaller essentially in the coccyx area. We have been using silver collagen. 10/5; 3-week follow-up. The patient returns with her area in the coccyx healed however distally in the perineum there is a fairly long stage II area. I am assuming this patient's wound is a pressure related area. Although this would not be a usual pressure area for a normal situation her immobility secondary to remote CVA,  dialysis etc. make her at risk for pressure areas in an otherwise unusual setting. The patient is fairly upset stating that we were told about this last time. I do not have this in my records. Her original wound was small and just about healed at that point and this visit was arranged in follow-up. If there was a new wound more distally into her perineum we did not look at it 11/2; patient's wound is slightly smaller. This is in the gluteal cleft on the left. Using silver alginate. She tells me she is aggressively offloading this area. 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 We have been using silver alginate. Arrives in clinic today with the wound larger. She is complaining of pain [incomplete paraplegia] Electronic Signature(s) Signed: 04/01/2021 4:53:46 PM By: Linton Ham MD Entered By: Linton Ham on 04/01/2021 13:30:37 -------------------------------------------------------------------------------- Physical Exam Details Patient Name: Date of Service: [name] DA MAESON, LOURENCO 04/01/2021 12:45 PM Medical Record Number: [id number] Patient Account Number: [id number] Date of Birth/Sex: Treating RN: July 12, 1980 (40 y.o. [name] Primary Care Provider: SITA FA LWA LLA, SHA RMIN Other Clinician: Referring Provider: Treating Provider/Extender: Linton Ham SITA FA LWA LLA, SHA RMIN Weeks in Treatment: 13 Constitutional Sitting or standing Blood Pressure is within target range for patient.. Pulse regular and within target range for patient.Marland Kitchen Respirations regular, non-labored and within target range.. Temperature is normal and within the target range for the patient.Marland Kitchen Appears in no distress. Notes Wound exam; this wound is on the left buttock in the gluteal cleft. Raised areas of senescent looking skin. The wound is larger. The granulation does not look too bad however the area is completely moist and somewhat macerated. With careful palpation there is no abscess that I could identify no erythema no evidence of infection. No crepitus Electronic Signature(s) Signed: 04/01/2021 4:53:46 PM By: Linton Ham MD Entered By: Linton Ham on 04/01/2021 13:31:48 -------------------------------------------------------------------------------- Physician Orders Details Patient Name: Date of Service: [name] DA ANESA, FRONEK 04/01/2021 12:45 PM Medical Record Number: [id number] Patient Account Number: [id number] Date of Birth/Sex: Treating RN: May 30, 1980 (40 y.o. [name] Primary Care Provider: SITA FA LWA LLA, SHA RMIN  Other Clinician: Referring Provider: Treating Provider/Extender: Linton Ham SITA FA LWA LLA, SHA RMIN Weeks in Treatment: 13 Verbal / Phone Orders: No Diagnosis Coding ICD-10 Coding Code Description L89.322 Pressure ulcer of left buttock, stage 2 G82.22 Paraplegia, incomplete Follow-up Appointments ppointment in 2 weeks. - Dr. Dellia Nims Return A Bathing/ Shower/ Hygiene May shower with protection but do not get wound dressing(s) wet. Off-Loading Turn and reposition every 2 hours Non Wound Condition Other Non Wound Condition Orders/Instructions: - Apply nystatin powder and gauze between right and left gluteus Wound Treatment Wound #6 - Perineum Cleanser: Wound Cleanser Every Other Day/30 Days Discharge Instructions: Cleanse the wound with wound cleanser prior to applying a clean dressing using gauze sponges, not tissue or cotton balls. Peri-Wound Care: Skin Prep Every Other Day/30 Days Discharge Instructions: Use skin prep as directed Prim Dressing: [place], 2X2 (in/in) ary Every Other Day/30 Days Secondary Dressing: Zetuvit Plus Silicone Border Dressing 4x4 (in/in) Every Other Day/30 Days Discharge Instructions: Apply silicone border over primary dressing as directed. Laboratory naerobe culture (MICRO) - PCR culture right gluteus Bacteria identified in Unspecified specimen by A  LOINC Code: 564-3 Convenience Name: Anerobic culture Electronic Signature(s) Signed: 04/01/2021 4:53:46 PM By: Linton Ham MD Signed: 04/01/2021 5:36:01 PM By: Rhae Hammock RN Entered By: Rhae Hammock on 04/01/2021 13:32:38 -------------------------------------------------------------------------------- Problem List Details Patient Name: Date of Service: [name] DA LEAHANNA, BUSER 04/01/2021 12:45 PM Medical Record Number: [id number] Patient Account Number: [id number] Date of Birth/Sex: Treating RN: 26-Jan-1981 (40 y.o. [name] Primary Care Provider:  SITA FA LWA LLA, SHA RMIN Other Clinician: Referring Provider: Treating Provider/Extender: Linton Ham SITA FA LWA LLA, SHA RMIN Weeks in Treatment: 13 Active Problems ICD-10 Encounter Code Description Active Date MDM Diagnosis L89.322 Pressure ulcer of left buttock, stage 2 04/01/2021 No Yes G82.22 Paraplegia, incomplete 03/04/2021 No Yes Inactive Problems ICD-10 Code Description Active Date Inactive Date L89.312 Pressure ulcer of right buttock, stage 2 12/31/2020 12/31/2020 L89.152 Pressure ulcer of sacral region, stage 2 02/04/2021 02/04/2021 Resolved Problems Electronic Signature(s) Signed: 04/01/2021 4:53:46 PM By: Linton Ham MD Signed: 04/01/2021 4:53:46 PM By: Linton Ham MD Entered By: Linton Ham on 04/01/2021 13:28:51 -------------------------------------------------------------------------------- Progress Note Details Patient Name: Date of Service: [name] DA KENNADIE, BRENNER 04/01/2021 12:45 PM Medical Record Number: [id number] Patient Account Number: [id number] Date of Birth/Sex: Treating RN: 1980/11/24 (40 y.o. [name] Primary Care Provider: SITA FA LWA LLA, SHA RMIN Other Clinician: Referring Provider: Treating Provider/Extender: Linton Ham SITA FA LWA LLA, SHA RMIN Weeks in Treatment: 13 Subjective History of Present Illness (HPI) Admission 06/15/2019 This is a 40 year old woman who comes to [place] from [facility] skilled facility in [facility]. I am able to follow a lot of her history on care everywhere in epic. The patient is a type II diabetic and has been on dialysis. 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 I am not exactly sure of the nature of the stroke at this present time. She has made a decent recovery and is now up walking with a walker. In August 2020 she was admitted to [facility] in [place] with L4-L5 discitis. Culture and sensitivity  showed Klebsiella. She was discharged on 8 weeks of ceftazidime. In mid September she is readmitted to the hospital with a pressure ulcer on her lower sacrum and necrotizing fasciitis. She required an extensive surgical debridement. CT scan of the pelvis at 1 point showed communication with the rectum. A colostomy was recommended and placed. Culture of this area showed Klebsiella and yeast. She was discharged on an extended course of meropenem and Diflucan. [facility] [place] We do not have any of these records. I think she was discharged to another nursing home but is come to [facility] skilled facility in [facility]. She was sent down here for review of the remaining wound. By review of the pictures that the patient had from September this is made considerable improvement. She tells me that the wound VAC was discontinued at the end of December 2020. Currently the facility is using a form of silver alginate to the wound surface. Past medical history type 2 diabetes with chronic renal failure on dialysis Tuesday Thursday and Saturday. CVA with left hemiparesis in June 2020 but she appears to be making a good recovery. Chronic diastolic heart failure, hypertension 2/19; patient's wound area slightly smaller. The area that was towards her anus has closed over. We are using Santyl covered with Hydrofera Blue. We have not been able to get her lab work apparently her veins  are very difficult for phlebotomy. They are trying to get the blood work done where she dialyzes in [facility] but that requires some logistical issues. 2/26; not much change in the wound. Still a nonviable surface we have been using Santyl and Hydrofera Blue. I changed her to [facility] only. She tells me she has rigorously offloading this area she also complains of some pain 3/5; still no change here. I switch to pure Hydrofera Blue. She still complains of a lot of  pain although is a big wound it seems out of proportion to what I might expect. This was initially a surgical wound secondary to necrotizing wound infection. She is on dialysis. Other than that she thinks she is offloading this fairly rigorously. She has not been systemically unwell 3/26; I switched to Iodoflex 3 weeks ago. This really seems to have helped, much better surface and slight improvement in the surface area. 4/26; the patient is still on Iodoflex she missed an appointment 2 weeks ago. Doing slightly smaller. She is on dialysis 5/10; using Hydrofera Blue. The major improvement here is the complete lack of depth. She has islands of epithelialization 09/24/19-Patient comes in at 2 weeks, dimensions about the same, there is an [place] of epithelium at margin of wound, no slough noted 10/15/2019 upon evaluation today patient appears to be doing well with regard to her wound. She has been tolerating the dressing changes in the sacral region without complication and at this point she seems to be healing quite nicely. I am extremely pleased with how things are progressing and the epithelial tissue seems to be spreading from the central portion as well which is also excellent. 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 We have been using Hydrofera Blue. The smaller area which has depth at 0.7 cm there is a question about whether they have been packing anything into this or just flaring Hydrofera Blue over the top 9/23; its been more than 2 months since this patient was here. She still has a small area distally which is the remanent of her stage IV wound. They have been using Hydrofera Blue. She also has an area on the left gluteal 10/22; 1 month follow-up. I think the original sacral wound is closed however the patient has 2 areas distally 1 over the coccyx a small pinpoint area and the other into the gluteal cleft. Neither 1 of these looks  particularly infected. I thought they might be connected but they were not. 11/15; the original sacral wound is closed patient only has 1 remaining area. 1 of these is in the gluteal cleft just below the coccyx. The area over the coccyx from last time I think is all so closed 12/31/2020; this is a patient that we saw for a pressure ulcer over her lower sacrum and coccyx for a long period of time in 2021. She eventually healed over. She is at [facility] skilled facility largely secondary to a remote CVA she had. She is also end-stage renal disease on dialysis secondary to type 2 diabetes. She tells [place] that since about mid June she has had an area more distally than the original sacrum/coccyx wound it looks as though they are putting silver alginate in this area. She is limited in her ability to offload this I think spending most of her time in a wheelchair although she can stand to transfer. She has an ostomy During her original stay here  in 2021 I also list that she had a more distal area into her perineum that closed over well before the more proximal coccyx/sacral wound. She had a CT scan of the abdomen and pelvis on 12/01/2020. This did note first chronic sacral decubitus along the right aspect of her distal sacrum and coccyx. There was not felt to be bone destruction she has chronic changes of bilateral sacroiliitis and chronic endplate destruction and sclerosis at L4-L5 secondary to previous osteomyelitis however no new findings were felt to be seen in terms of bone problems in this area 01/14/2021; 2-week follow-up. Her wound is smaller essentially in the coccyx area. We have been using silver collagen. 10/5; 3-week follow-up. The patient returns with her area in the coccyx healed however distally in the perineum there is a fairly long stage II area. I am assuming this patient's wound is a pressure related area. Although this would not be a usual pressure area for a normal situation her immobility  secondary to remote CVA, dialysis etc. make her at risk for pressure areas in an otherwise unusual setting. The patient is fairly upset stating that we were told about this last time. I do not have this in my records. Her original wound was small and just about healed at that point and this visit was arranged in follow-up. If there was a new wound more distally into her perineum we did not look at it 11/2; patient's wound is slightly smaller. This is in the gluteal cleft on the left. Using silver alginate. She tells me she is aggressively offloading this area. 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 We have been using silver alginate. Arrives in clinic today with the wound larger. She is complaining of pain [incomplete paraplegia] Objective Constitutional Sitting or standing Blood Pressure is within target range for patient.. Pulse regular and within target range for patient.Marland Kitchen Respirations regular, non-labored and within target range.. Temperature is normal and within the target range for the patient.Marland Kitchen Appears in no distress. Vitals Time Taken: 12:50 PM, Temperature: 98.7 F, Pulse: 74 bpm, Respiratory Rate: 17 breaths/min, Blood Pressure: 141/74 mmHg, Capillary Blood Glucose: 145 mg/dl. General Notes: Wound exam; this wound is on the left buttock in the gluteal cleft. Raised areas of senescent looking skin. The wound is larger. The granulation does not look too bad however the area is completely moist and somewhat macerated. With careful palpation there is no abscess that I could identify no erythema no evidence of infection. No crepitus Integumentary (Hair, Skin) Wound #6 status is Open. Original cause of wound was Pressure Injury. The date acquired was: 02/04/2021. The wound has been in  treatment 8 weeks. The wound is located on the Perineum. The wound measures 3.5cm length x 1.3cm width x 0.2cm depth; 3.574cm^2 area and 0.715cm^3 volume. There is Fat Layer (Subcutaneous Tissue) exposed. There is no tunneling or undermining noted. There is a medium amount of serosanguineous drainage noted. The wound margin is flat and intact. There is large (67-100%) pink granulation within the wound bed. There is a small (1-33%) amount of necrotic tissue within the wound bed including Adherent Slough. Assessment Active Problems ICD-10 Pressure ulcer of left buttock, stage 2 Paraplegia, incomplete Plan 1. I used a #5 curette to get a deep tissue culture for  PCR. No empiric antibiotics 2. Aquacel Ag if they have this. 3. She is in a skilled facility therefore dressing choices to put on the wound may be fairly sparse 4. If the PCR culture is really unhelpful I may consider biopsying this just for completeness. Atypical area for a pressure ulcer but in her setting with her neurology injury and dialysis 3 times a week this may be enough reason to except this is a pressure ulcer Electronic Signature(s) Signed: 04/01/2021 4:53:46 PM By: Linton Ham MD Entered By: Linton Ham on 04/01/2021 13:34:10 -------------------------------------------------------------------------------- SuperBill Details Patient Name: Date of Service: [name] DA ARLYSS, WEATHERSBY 04/01/2021 Medical Record Number: [id number] Patient Account Number: [id number] Date of Birth/Sex: Treating RN: 15-Jan-1981 (40 y.o. [name] Primary Care Provider: SITA FA LWA LLA, SHA RMIN Other Clinician: Referring Provider: Treating Provider/Extender: Linton Ham SITA FA LWA LLA, SHA RMIN Weeks in Treatment: 13 Diagnosis Coding ICD-10 Codes Code Description T80.044 Pressure ulcer of left buttock, stage 2 G82.22 Paraplegia, incomplete Facility Procedures CPT4 Code: [id number] Description: 68548 - WOUND CARE VISIT-LEV  3 EST PT Modifier: Quantity: 1 Physician Procedures : CPT4 Code Description Modifier [id number] - WC PHYS LEVEL 4 - EST PT ICD-10 Diagnosis Description L89.322 Pressure ulcer of left buttock, stage 2 G82.22 Paraplegia, incomplete Quantity: 1 Electronic Signature(s) Signed: 04/01/2021 4:53:46 PM By: Linton Ham MD Signed: 04/01/2021 5:36:01 PM By: Rhae Hammock RN Entered By: Rhae Hammock on 04/01/2021 13:37:42

## 2021-04-01 NOTE — Therapy (Deleted)
[name], [place], [address] Phone: [phone]   Fax:  [phone]  Physical Therapy Evaluation  Patient Details  Name: [name] Barnett MRN: [id number] Date of Birth: [birth year] Referring Provider (PT): Leonides Grills   Encounter Date: 04/01/2021   PT End of Session - 04/01/21 1020     Visit Number 1    Number of Visits 12    Date for PT Re-Evaluation 05/01/21    Authorization Type medicare/medicaid    Progress Note Due on Visit 10    PT Start Time 0900    PT Stop Time 0950    PT Time Calculation (min) 50 min    Activity Tolerance Patient tolerated treatment well    Behavior During Therapy [facility] for tasks assessed/performed             Past Medical History:  Diagnosis Date   Diabetes mellitus without complication ([employer])    Hyperlipidemia    Hypertension    Renal disorder    Stroke [facility])    Vitamin D deficiency     Past Surgical History:  Procedure Laterality Date   cardiac stents     CATARACT EXTRACTION W/PHACO Left 08/29/2020   Procedure: CATARACT EXTRACTION PHACO AND INTRAOCULAR LENS PLACEMENT LEFT EYE;  Surgeon: Baruch Goldmann, MD;  Location: [facility];  Service: Ophthalmology;  Laterality: Left;  left CDE=29.30   CATARACT EXTRACTION W/PHACO Right 11/28/2020   Procedure: CATARACT EXTRACTION PHACO AND INTRAOCULAR LENS PLACEMENT (IOC);  Surgeon: Baruch Goldmann, MD;  Location: [facility];  Service: Ophthalmology;  Laterality: Right;  CDE 4.35   COLOSTOMY     TUBAL LIGATION     wound on buttocks      There were no vitals filed for this visit.    Subjective Assessment - 04/01/21 0859     Subjective Ms [name] Barnett states that she has been having swelling in her legs since she was about 16.   She went to [facility] and got bandaged there and her legs got better.  She has had several bouts of flare up due to not staying on her regieme due to multiple illnesses.  The pt had a CVA and caused her to bea  limited walker, (1000 ft with a rolling walker) since her stroke in June 2020.  She developed a pressure ulcer on her sacral area which became septic.  She went into a Coma on Sept 7th 2020 and woke up on Sept 14th.  She states she has been at [facility] ever since.  She had garments and a pump years ago and is unsure where they are.    Pertinent History DM, Renal disorder with active dialysis , CVA                [facility] PT Assessment - 04/01/21 0001       Assessment   Medical Diagnosis lymphedema    Referring Provider (PT) Leonides Grills    Next MD Visit unknown    Prior Therapy none      Precautions   Precautions --   cellulitis     Restrictions   Weight Bearing Restrictions No      Balance Screen   Has the patient fallen in the past 6 months No    Has the patient had a decrease in activity level because of a fear of falling?  Yes   CVA   Is the patient reluctant to leave their home because of a fear of  falling?  No      Prior Function   Level of Independence Independent with basic ADLs    Vocation On disability      Cognition   Overall Cognitive Status Within Functional Limits for tasks assessed      Observation/Other Assessments-Edema    Edema --   see measurements; skin is notably dry.              LYMPHEDEMA/ONCOLOGY QUESTIONNAIRE - 04/01/21 0001       What other symptoms do you have   Are you Having Heaviness or Tightness Yes    Are you having Pain No    Are you having pitting edema Yes    Body Site leg    Is it Hard or Difficult finding clothes that fit No    Do you have infections No    Is there Decreased scar mobility No    Stemmer Sign No      Lymphedema Stage   Stage STAGE 2 SPONTANEOUSLY IRREVERSIBLE      Lymphedema Assessments   Lymphedema Assessments Lower extremities      Right Lower Extremity Lymphedema   20 cm Proximal to Suprapatella 60.5 cm    10 cm Proximal to Suprapatella 47.5 cm    At Midpatella/Popliteal Crease 39 cm    30  cm Proximal to Floor at Lateral Plantar Foot 35.2 cm    20 cm Proximal to Floor at Lateral Plantar Foot 29.2 1    10  cm Proximal to Floor at Lateral Malleoli 28.2 cm    Circumference of ankle/heel 33 cm.    5 cm Proximal to 1st MTP Joint 23.8 cm    Across MTP Joint 24.7 cm    Around Proximal Great Toe 8.9 cm      Left Lower Extremity Lymphedema   20 cm Proximal to Suprapatella 57 cm    10 cm Proximal to Suprapatella 46.3 cm    At Midpatella/Popliteal Crease 39 cm    30 cm Proximal to Floor at Lateral Plantar Foot 31.8 cm    20 cm Proximal to Floor at Lateral Plantar Foot 22.6 cm    10 cm Proximal to Floor at Lateral Malleoli 22 cm    Circumference of ankle/heel 30.9 cm.    5 cm Proximal to 1st MTP Joint 23.3 cm    Across MTP Joint 23.7 cm    Around Proximal Great Toe 8.1 cm             Life impact score: 68        Objective measurements completed on examination: See above findings.       [facility] Adult PT Treatment/Exercise - 04/01/21 0001       Exercises   Exercises --   ankle pumps, LAQ, hip ab/adduction, marching, diaphragmic breath and lymph squeeze all x 10 rep B     Manual Therapy   Manual Therapy Compression Bandaging;Manual Lymphatic Drainage (MLD)    Manual therapy comments done seperate from all other aspects of treatment    Compression Bandaging foam cut for Rt LE                     PT Education - 04/01/21 1019     Education Details HEP    Person(s) Educated Patient    Methods Explanation;Demonstration;Handout    Comprehension Returned demonstration              PT Short Term Goals - 04/01/21 1034  PT SHORT TERM GOAL #1   Title PT to be able to vebalize the four aspects to control lymphedema.    Status New    Target Date 04/15/21               PT Long Term Goals - 04/01/21 1035       PT LONG TERM GOAL #1   Title PT LE measurements to be decreased 2-4 cm to reduce risk of cellulitis    Time 4    Period Weeks     Status New    Target Date 04/29/21                    Plan - 04/01/21 1023     Clinical Impression Statement [name] is a 40 yo female who has been diagnosed with lymphedema when she was a teenager.  She has had several exacerbations and at times her legs become much bigger than they are now.  She had a CVA in June 2020 which lead to a sacral ulcer, which became septic and she was in a coma for four days.  She was discharged to [facility] and has resided there ever since.  She is currently on dialysis. The MD at [facility] referred [name] to this facility. This therapist has contacted her renal MD to verify that he agrees with continued treatment.  At this time [name] states that the edema is her leg is affecting her ability to walk and she desires treatment no matter what her renal MD states.  At this time [name] has increased edema and decreased skin integrity and will benefit from skilled PT to improve both her skin's integrity as well as decrease her edema.    Personal Factors and Comorbidities Comorbidity 3+;Fitness    Comorbidities DM, renal disorder, CVA,    Examination-Activity Limitations Locomotion Level    Examination-Participation Restrictions Other    Stability/Clinical Decision Making Evolving/Moderate complexity    Clinical Decision Making Moderate    Rehab Potential Good    PT Frequency 3x / week    PT Duration 4 weeks    PT Treatment/Interventions Patient/family education;Manual techniques;Manual lymph drainage    PT Next Visit Plan begin total decongestive techniques    PT Home Exercise Plan ankle pumps, LAQ, hip ab/adduction, marching, diaphragmic breath and lymph squeeze all x 10 rep B             Patient will benefit from skilled therapeutic intervention in order to improve the following deficits and impairments:  Decreased skin integrity, Increased edema  Visit Diagnosis: Lymphedema, not elsewhere classified     Problem  List Patient Active Problem List   Diagnosis Date Noted   BV (bacterial vaginosis) 04/02/2020   Vaginal itching 04/02/2020   Vaginal odor 04/02/2020   Vaginal discharge 04/02/2020   Sepsis secondary to UTI ([place]) 01/13/2020   Acute respiratory failure with hypoxia ([place]) 01/13/2020   Right leg swelling 01/13/2020   Sacral decubitus ulcer 01/13/2020   Anemia due to end stage renal disease ([place]) 01/13/2020   Essential hypertension 01/13/2020   Hyperlipidemia 01/13/2020   Type 2 diabetes mellitus ([place]) 01/13/2020   Rayetta Humphrey, PT CLT [phone]  04/01/2021, 10:37 AM  [place] [address] [place], [place], [address] Phone: [phone]   Fax:  [phone]  Name: Denell Cothern MRN: [id number] Date of Birth: 1980/11/10

## 2021-04-02 ENCOUNTER — Encounter (HOSPITAL_COMMUNITY): Payer: Self-pay | Admitting: Physical Therapy

## 2021-04-02 NOTE — Therapy (Signed)
[place], [place], [address] Phone: [phone]   Fax:  [phone]  Patient Details  Name: [name] Barnett MRN: [id number] Date of Birth: March 16, 1981 Referring Provider:  No ref. provider found  Encounter Date: 04/02/2021 Cleon Gustin, MD  Leeroy Cha, PT Yes she can proceed with treatment        Previous Messages   ----- Message -----  From: Leeroy Cha, PT  Sent: 04/01/2021  10:18 AM EST  To: Cleon Gustin, MD   Good Morning Dr. Alyson Ingles,   I am a lymphedema certified therapist to whom your pt, [name] Barnett, has been referred.  [name] Barnett is most eager to start lymphedema therapy but since she is on dialysis I wanted to confirm with you that it is alright to go ahead and proceed with treatment.   Thank you for your consideration in this treatment.   Rayetta Humphrey, PT CLT  [place], PT CLT [phone]  04/02/2021, 10:49 AM  [place], [place], [address] Phone: [phone]   Fax:  [phone]

## 2021-04-03 ENCOUNTER — Other Ambulatory Visit: Payer: Self-pay

## 2021-04-03 ENCOUNTER — Ambulatory Visit (HOSPITAL_COMMUNITY): Payer: Medicare Other | Attending: Emergency Medicine | Admitting: Physical Therapy

## 2021-04-03 DIAGNOSIS — R262 Difficulty in walking, not elsewhere classified: Secondary | ICD-10-CM | POA: Diagnosis present

## 2021-04-03 DIAGNOSIS — I890 Lymphedema, not elsewhere classified: Secondary | ICD-10-CM | POA: Insufficient documentation

## 2021-04-03 NOTE — Therapy (Signed)
[place], [place], [address] Phone: [phone]   Fax:  [phone]  Physical Therapy Treatment  Patient Details  Name: [name] Barnett MRN: [id number] Date of Birth: May 15, 1980 Referring Provider (PT): Leonides Grills   Encounter Date: 04/03/2021   PT End of Session - 04/03/21 1535     Visit Number 2    Number of Visits 12    Date for PT Re-Evaluation 05/01/21    Authorization Type medicare/medicaid    Progress Note Due on Visit 10    PT Start Time 1761    PT Stop Time 1525    PT Time Calculation (min) 40 min             Past Medical History:  Diagnosis Date   Diabetes mellitus without complication ([place])    Hyperlipidemia    Hypertension    Renal disorder    Stroke [facility])    Vitamin D deficiency     Past Surgical History:  Procedure Laterality Date   cardiac stents     CATARACT EXTRACTION W/PHACO Left 08/29/2020   Procedure: CATARACT EXTRACTION PHACO AND INTRAOCULAR LENS PLACEMENT LEFT EYE;  Surgeon: Baruch Goldmann, MD;  Location: [facility];  Service: Ophthalmology;  Laterality: Left;  left CDE=29.30   CATARACT EXTRACTION W/PHACO Right 11/28/2020   Procedure: CATARACT EXTRACTION PHACO AND INTRAOCULAR LENS PLACEMENT (IOC);  Surgeon: Baruch Goldmann, MD;  Location: [facility];  Service: Ophthalmology;  Laterality: Right;  CDE 4.35   COLOSTOMY     TUBAL LIGATION     wound on buttocks      There were no vitals filed for this visit.   Subjective Assessment - 04/03/21 1531     Subjective Pt states that she has not been doing her exercises.    Pertinent History DM, Renal disorder with active dialysis , CVA    Currently in Pain? No/denies                               [facility] Adult PT Treatment/Exercise - 04/03/21 0001       Manual Therapy   Manual Therapy Compression Bandaging;Manual Lymphatic Drainage (MLD)    Manual therapy comments done seperate from all other aspects of  treatment    Manual Lymphatic Drainage (MLD) To include supraclavicular, deep and superfical abdominal, Rt inguinal axillary and intrainguinal anastomosis as well as anterior and posterior RT LE ; posterior completed semiprone.    Compression Bandaging Foam and multilayer short stretch bandages from foot to knee of RT LE                     PT Education - 04/03/21 1535     Education Details explained not to get bandages wet. To keep bandages on as long as possible and take off and roll prior to [facility] session    Person(s) Educated Patient    Methods Explanation    Comprehension Verbalized understanding              PT Short Term Goals - 04/03/21 1537       PT SHORT TERM GOAL #1   Title PT to be able to vebalize the four aspects to control lymphedema.    Status On-going    Target Date 04/15/21               PT Long Term Goals - 04/03/21 1537  PT LONG TERM GOAL #1   Title PT LE measurements to be decreased 2-4 cm to reduce risk of cellulitis    Time 4    Period Weeks    Status On-going                   Plan - 04/03/21 1536     Clinical Impression Statement Recieved ok from nephrologist to begin total decongestive techniques to decrease pt edema.  PT tolerated well    Personal Factors and Comorbidities Comorbidity 3+;Fitness    Comorbidities DM, renal disorder, CVA,    Examination-Activity Limitations Locomotion Level    Examination-Participation Restrictions Other    Stability/Clinical Decision Making Evolving/Moderate complexity    Rehab Potential Good    PT Frequency 3x / week    PT Duration 4 weeks    PT Treatment/Interventions Patient/family education;Manual techniques;Manual lymph drainage    PT Next Visit Plan continue  total decongestive techniques measure on Wed.    PT Home Exercise Plan ankle pumps, LAQ, hip ab/adduction, marching, diaphragmic breath and lymph squeeze all x 10 rep B             Patient will benefit  from skilled therapeutic intervention in order to improve the following deficits and impairments:  Decreased skin integrity, Increased edema  Visit Diagnosis: Lymphedema, not elsewhere classified  Difficulty in walking, not elsewhere classified     Problem List Patient Active Problem List   Diagnosis Date Noted   BV (bacterial vaginosis) 04/02/2020   Vaginal itching 04/02/2020   Vaginal odor 04/02/2020   Vaginal discharge 04/02/2020   Sepsis secondary to UTI ([place]) 01/13/2020   Acute respiratory failure with hypoxia ([place]) 01/13/2020   Right leg swelling 01/13/2020   Sacral decubitus ulcer 01/13/2020   Anemia due to end stage renal disease ([place]) 01/13/2020   Essential hypertension 01/13/2020   Hyperlipidemia 01/13/2020   Type 2 diabetes mellitus ([place]) 01/13/2020   Rayetta Humphrey, PT CLT [phone]  04/03/2021, 3:38 PM  [place] [address] [place], [place], [address] Phone: [phone]   Fax:  [phone]  Name: [name] Barnett MRN: [id number] Date of Birth: Sep 22, 1980

## 2021-04-06 ENCOUNTER — Telehealth (HOSPITAL_COMMUNITY): Payer: Self-pay | Admitting: Physical Therapy

## 2021-04-06 ENCOUNTER — Ambulatory Visit (HOSPITAL_COMMUNITY): Payer: Medicare Other | Admitting: Physical Therapy

## 2021-04-06 NOTE — Telephone Encounter (Signed)
Caregiver called Marliss Coots) patient has the flu and will not be here this week.

## 2021-04-08 ENCOUNTER — Ambulatory Visit (HOSPITAL_COMMUNITY): Payer: Medicare Other

## 2021-04-10 ENCOUNTER — Ambulatory Visit (HOSPITAL_COMMUNITY): Payer: Medicare Other

## 2021-04-13 ENCOUNTER — Other Ambulatory Visit: Payer: Self-pay

## 2021-04-13 ENCOUNTER — Ambulatory Visit (HOSPITAL_COMMUNITY): Payer: Medicare Other | Admitting: Physical Therapy

## 2021-04-13 DIAGNOSIS — I890 Lymphedema, not elsewhere classified: Secondary | ICD-10-CM

## 2021-04-13 DIAGNOSIS — R262 Difficulty in walking, not elsewhere classified: Secondary | ICD-10-CM

## 2021-04-13 NOTE — Therapy (Signed)
[name] Barnett, [place], [address] Phone: [phone]   Fax:  [phone]  Physical Therapy Treatment  Patient Details  Name: [name] Barnett MRN: [id number] Date of Birth: 06-May-1980 Referring Provider (PT): Leonides Grills   Encounter Date: 04/13/2021   PT End of Session - 04/13/21 1409     Visit Number 3    Number of Visits 12    Date for PT Re-Evaluation 05/01/21    Authorization Type medicare/medicaid 80/20    Progress Note Due on Visit 10    PT Start Time 1315    PT Stop Time 1400    PT Time Calculation (min) 45 min             Past Medical History:  Diagnosis Date   Diabetes mellitus without complication ([name])    Hyperlipidemia    Hypertension    Renal disorder    Stroke [facility])    Vitamin D deficiency     Past Surgical History:  Procedure Laterality Date   cardiac stents     CATARACT EXTRACTION W/PHACO Left 08/29/2020   Procedure: CATARACT EXTRACTION PHACO AND INTRAOCULAR LENS PLACEMENT LEFT EYE;  Surgeon: Baruch Goldmann, MD;  Location: [facility];  Service: Ophthalmology;  Laterality: Left;  left CDE=29.30   CATARACT EXTRACTION W/PHACO Right 11/28/2020   Procedure: CATARACT EXTRACTION PHACO AND INTRAOCULAR LENS PLACEMENT (IOC);  Surgeon: Baruch Goldmann, MD;  Location: [facility];  Service: Ophthalmology;  Laterality: Right;  CDE 4.35   COLOSTOMY     TUBAL LIGATION     wound on buttocks      There were no vitals filed for this visit.   Subjective Assessment - 04/13/21 1404     Subjective pt states she does not do any exercises at her rehab facility but she's been doing the ones that were given here.  States she had the flu all last week.  Hopes to be going home (approx 2 hours away) in a couple weeks.    Currently in Pain? No/denies                               [facility] Adult PT Treatment/Exercise - 04/13/21 0001       Manual Therapy   Manual Therapy Compression  Bandaging;Manual Lymphatic Drainage (MLD)    Manual therapy comments done seperate from all other aspects of treatment    Manual Lymphatic Drainage (MLD) To include supraclavicular, deep and superfical abdominal, Rt inguinal axillary and intrainguinal anastomosis as well as anterior and posterior RT LE ; posterior completed semiprone.    Compression Bandaging Foam and multilayer short stretch bandages from foot to knee of RT LE                       PT Short Term Goals - 04/03/21 1537       PT SHORT TERM GOAL #1   Title PT to be able to vebalize the four aspects to control lymphedema.    Status On-going    Target Date 04/15/21               PT Long Term Goals - 04/03/21 1537       PT LONG TERM GOAL #1   Title PT LE measurements to be decreased 2-4 cm to reduce risk of cellulitis    Time 4    Period Weeks    Status On-going  Plan - 04/13/21 1415     Clinical Impression Statement returns today after missing 1 week due to the flu.  Reports she should be returning home in a couple weeks and she will transition to a local lymphedema clinic at that point.  Continued with manual lymph driainage for Rt LE with compression bandaging following to distal LE.  Also utilizied toe bandaging for Rt LE.  Pt reported overall comfort.  Instructed to roll the bandages prior to return next session.    Personal Factors and Comorbidities Comorbidity 3+;Fitness    Comorbidities DM, renal disorder, CVA,    Examination-Activity Limitations Locomotion Level    Examination-Participation Restrictions Other    Stability/Clinical Decision Making Evolving/Moderate complexity    Rehab Potential Good    PT Frequency 3x / week    PT Duration 4 weeks    PT Treatment/Interventions Patient/family education;Manual techniques;Manual lymph drainage    PT Next Visit Plan continue  total decongestive techniques measure on Wed.    PT Home Exercise Plan ankle pumps, LAQ, hip  ab/adduction, marching, diaphragmic breath and lymph squeeze all x 10 rep B             Patient will benefit from skilled therapeutic intervention in order to improve the following deficits and impairments:  Decreased skin integrity, Increased edema  Visit Diagnosis: Lymphedema, not elsewhere classified  Difficulty in walking, not elsewhere classified     Problem List Patient Active Problem List   Diagnosis Date Noted   BV (bacterial vaginosis) 04/02/2020   Vaginal itching 04/02/2020   Vaginal odor 04/02/2020   Vaginal discharge 04/02/2020   Sepsis secondary to UTI ([place]) 01/13/2020   Acute respiratory failure with hypoxia ([place]) 01/13/2020   Right leg swelling 01/13/2020   Sacral decubitus ulcer 01/13/2020   Anemia due to end stage renal disease ([place]) 01/13/2020   Essential hypertension 01/13/2020   Hyperlipidemia 01/13/2020   Type 2 diabetes mellitus ([place]) 01/13/2020   Zeeva Courser B Mare Barnett, PTA/CLT, WTA [phone]  [name], PTA 04/13/2021, 2:16 PM  [place] [address] [place], [place], [address] Phone: [phone]   Fax:  [phone]  Name: [name] Barnett MRN: [id number] Date of Birth: 1980/12/21

## 2021-04-15 ENCOUNTER — Ambulatory Visit (HOSPITAL_COMMUNITY): Payer: Medicare Other | Admitting: Physical Therapy

## 2021-04-15 ENCOUNTER — Other Ambulatory Visit: Payer: Self-pay

## 2021-04-15 ENCOUNTER — Encounter (HOSPITAL_BASED_OUTPATIENT_CLINIC_OR_DEPARTMENT_OTHER): Payer: Medicare Other | Attending: Internal Medicine | Admitting: Internal Medicine

## 2021-04-15 DIAGNOSIS — G8222 Paraplegia, incomplete: Secondary | ICD-10-CM | POA: Insufficient documentation

## 2021-04-15 DIAGNOSIS — I69354 Hemiplegia and hemiparesis following cerebral infarction affecting left non-dominant side: Secondary | ICD-10-CM | POA: Diagnosis not present

## 2021-04-15 DIAGNOSIS — Z992 Dependence on renal dialysis: Secondary | ICD-10-CM | POA: Insufficient documentation

## 2021-04-15 DIAGNOSIS — L89322 Pressure ulcer of left buttock, stage 2: Secondary | ICD-10-CM | POA: Insufficient documentation

## 2021-04-15 DIAGNOSIS — N189 Chronic kidney disease, unspecified: Secondary | ICD-10-CM | POA: Diagnosis not present

## 2021-04-15 DIAGNOSIS — I130 Hypertensive heart and chronic kidney disease with heart failure and stage 1 through stage 4 chronic kidney disease, or unspecified chronic kidney disease: Secondary | ICD-10-CM | POA: Diagnosis not present

## 2021-04-15 DIAGNOSIS — I5032 Chronic diastolic (congestive) heart failure: Secondary | ICD-10-CM | POA: Diagnosis not present

## 2021-04-15 DIAGNOSIS — R262 Difficulty in walking, not elsewhere classified: Secondary | ICD-10-CM

## 2021-04-15 DIAGNOSIS — I890 Lymphedema, not elsewhere classified: Secondary | ICD-10-CM | POA: Diagnosis not present

## 2021-04-15 NOTE — Progress Notes (Signed)
[name] Barnett, DEBERRY ([id number]) Visit Report for 04/15/2021 Arrival Information Details Patient Name: Date of Service: [name] Barnett, [name] Barnett 04/15/2021 10:15 A M Medical Record Number: [id number] Patient Account Number: [id number] Date of Birth/Sex: Treating RN: 26-Sep-1980 (40 y.o. F) [place]ton, Mechele Claude Primary Care Jonda Alanis: SITA FA LWA LLA, SHA RMIN Other Clinician: Referring Keesha Pellum: Treating Yalitza Teed/Extender: Linton Ham SITA FA LWA LLA, SHA RMIN Weeks in Treatment: 15 Visit Information History Since Last Visit Added or deleted any medications: No Patient Arrived: Wheel Chair Any new allergies or adverse reactions: No Arrival Time: 10:35 Had a fall or experienced change in No Accompanied By: alone activities of daily living that may affect Transfer Assistance: None risk of falls: Patient Requires Transmission-Based Precautions: No Signs or symptoms of abuse/neglect since last visito No Patient Has Alerts: No Hospitalized since last visit: No Implantable device outside of the clinic excluding No cellular tissue based products placed in the center since last visit: Has Dressing in Place as Prescribed: Yes Pain Present Now: Yes Electronic Signature(s) Signed: 04/15/2021 5:23:39 PM By: Dellie Catholic RN Entered By: Dellie Catholic on 04/15/2021 10:36:13 -------------------------------------------------------------------------------- Encounter Discharge Information Details Patient Name: Date of Service: [name] Barnett, [name] Barnett 04/15/2021 10:15 A M Medical Record Number: [id number] Patient Account Number: [id number] Date of Birth/Sex: Treating RN: 05-Jun-1980 (40 y.o. F) [name] Primary Care Alyx Gee: SITA FA LWA LLA, SHA RMIN Other Clinician: Referring Quinn Quam: Treating Daegon Deiss/Extender: Linton Ham SITA FA LWA LLA, SHA RMIN Weeks in Treatment: 15 Encounter Discharge Information Items Post Procedure Vitals Discharge Condition: Stable Temperature (F):  98.6 Ambulatory Status: Wheelchair Pulse (bpm): 92 Discharge Destination: [place] Respiratory Rate (breaths/min): 18 Telephoned: No Blood Pressure (mmHg): 123/82 Orders Sent: Yes Transportation: Other Accompanied By: self Schedule Follow-up Appointment: Yes Clinical Summary of Care: Patient Declined Notes facility transportation Electronic Signature(s) Signed: 04/15/2021 4:24:09 PM By: [name] RN, BSN Entered By: [name] on 04/15/2021 11:26:26 -------------------------------------------------------------------------------- Lower Extremity Assessment Details Patient Name: Date of Service: [name] Barnett, [name] Barnett 04/15/2021 10:15 A M Medical Record Number: [id number] Patient Account Number: [id number] Date of Birth/Sex: Treating RN: 1980/11/28 (40 y.o. America Brown Primary Care Yovana Scogin: SITA FA Janace Aris, SHA RMIN Other Clinician: Referring Navya Timmons: Treating Ira Dougher/Extender: Linton Ham SITA FA LWA LLA, SHA RMIN Weeks in Treatment: 15 Electronic Signature(s) Signed: 04/15/2021 5:23:39 PM By: Dellie Catholic RN Entered By: Dellie Catholic on 04/15/2021 10:38:08 -------------------------------------------------------------------------------- Multi Wound Chart Details Patient Name: Date of Service: [name] Barnett, [name] Barnett 04/15/2021 10:15 A M Medical Record Number: [id number] Patient Account Number: [id number] Date of Birth/Sex: Treating RN: 02-28-1981 (40 y.o. [name] Primary Care Maudell Stanbrough: SITA FA LWA LLA, SHA RMIN Other Clinician: Referring Bryanna Yim: Treating Ashlea Dusing/Extender: Linton Ham SITA FA LWA LLA, SHA RMIN Weeks in Treatment: 15 Vital Signs Height(in): Pulse(bpm): 92 Weight(lbs): Blood Pressure(mmHg): 123/82 Body Mass Index(BMI): Temperature(F): 98.6 Respiratory Rate(breaths/min): 18 Photos: [6:No Photos Perineum] [N/A:N/A N/A] Wound Location: [6:Pressure Injury] [N/A:N/A] Wounding Event:  [6:Pressure Ulcer] [N/A:N/A] Primary Etiology: [6:Cataracts, Glaucoma, Anemia,] [N/A:N/A] Comorbid History: [6:Congestive Heart Failure, Hypertension, Type II Diabetes, End Stage Renal Disease, Neuropathy, Confinement Anxiety 02/04/2021] [N/A:N/A] Date Acquired: [6:10] [N/A:N/A] Weeks of Treatment: [6:Open] [N/A:N/A] Wound Status: [6:3.2x1.1x0.4] [N/A:N/A] Measurements L x W x D (cm) [6:2.765] [N/A:N/A] A (cm) : rea [6:1.106] [N/A:N/A] Volume (cm) : [6:-57.20%] [N/A:N/A] % Reduction in A rea: [6:-214.20%] [N/A:N/A] % Reduction in Volume: [6:Category/Stage II] [N/A:N/A] Classification: [6:Medium] [N/A:N/A] Exudate A mount: [6:Serosanguineous] [N/A:N/A] Exudate Type: [6:red, brown] [N/A:N/A] Exudate Color: [6:Flat and Intact] [N/A:N/A] Wound Margin: [6:Large (  67-100%)] [N/A:N/A] Granulation A mount: [6:Pink] [N/A:N/A] Granulation Quality: [6:Small (1-33%)] [N/A:N/A] Necrotic A mount: [6:Fat Layer (Subcutaneous Tissue): Yes N/A] Exposed Structures: [6:Fascia: No Tendon: No Muscle: No Joint: No Bone: No None] [N/A:N/A] Epithelialization: [6:Debridement - Excisional] [N/A:N/A] Debridement: [6:10:55] [N/A:N/A] Pre-procedure Verification/Time Out Taken: [6:Other] [N/A:N/A] Pain Control: [6:Subcutaneous, Slough] [N/A:N/A] Tissue Debrided: [6:Skin/Subcutaneous Tissue] [N/A:N/A] Level: [6:3.52] [N/A:N/A] Debridement A (sq cm): [6:rea Curette] [N/A:N/A] Instrument: [6:Minimum] [N/A:N/A] Bleeding: [6:Pressure] [N/A:N/A] Hemostasis A chieved: [6:7] [N/A:N/A] Procedural Pain: [6:3] [N/A:N/A] Post Procedural Pain: [6:Procedure was tolerated well] [N/A:N/A] Debridement Treatment Response: [6:3.2x1.1x0.4] [N/A:N/A] Post Debridement Measurements L x W x D (cm) [6:1.106] [N/A:N/A] Post Debridement Volume: (cm) [6:Category/Stage II] [N/A:N/A] Post Debridement Stage: [6:Debridement] [N/A:N/A] Treatment Notes Electronic Signature(s) Signed: 04/15/2021 4:51:07 PM By: Linton Ham MD Signed:  04/15/2021 5:40:46 PM By: Levan Hurst RN, BSN Entered By: Linton Ham on 04/15/2021 11:07:22 -------------------------------------------------------------------------------- Multi-Disciplinary Care Plan Details Patient Name: Date of Service: [name] Barnett, [name] Barnett 04/15/2021 10:15 A M Medical Record Number: [id number] Patient Account Number: [id number] Date of Birth/Sex: Treating RN: February 06, 1981 (40 y.o. F) [name] Primary Care Maly Lemarr: SITA FA LWA LLA, SHA RMIN Other Clinician: Referring Jarissa Sheriff: Treating Mariya Mottley/Extender: Linton Ham SITA FA LWA LLA, SHA RMIN Weeks in Treatment: 15 Active Inactive Wound/Skin Impairment Nursing Diagnoses: Impaired tissue integrity Knowledge deficit related to ulceration/compromised skin integrity Goals: Patient will have a decrease in wound volume by X% from date: (specify in notes) Date Initiated: 12/31/2020 Date Inactivated: 02/04/2021 Target Resolution Date: 01/07/2021 Goal Status: Met Patient/caregiver will verbalize understanding of skin care regimen Date Initiated: 12/31/2020 Target Resolution Date: 05/13/2021 Goal Status: Active Ulcer/skin breakdown will have a volume reduction of 30% by week 4 Date Initiated: 12/31/2020 Date Inactivated: 01/14/2021 Target Resolution Date: 01/10/2021 Goal Status: Met Ulcer/skin breakdown will have a volume reduction of 50% by week 8 Date Initiated: 12/31/2020 Date Inactivated: 01/14/2021 Target Resolution Date: 01/23/2021 Goal Status: Met Ulcer/skin breakdown will have a volume reduction of 80% by week 12 Date Initiated: 12/31/2020 Date Inactivated: 02/04/2021 Target Resolution Date: 02/18/2021 Goal Status: Met Interventions: Assess patient/caregiver ability to obtain necessary supplies Assess patient/caregiver ability to perform ulcer/skin care regimen upon admission and as needed Assess ulceration(s) every visit Notes: Electronic Signature(s) Signed: 04/15/2021 4:24:09 PM By:  [name] RN, BSN Entered By: [name] on 04/15/2021 11:22:55 -------------------------------------------------------------------------------- Pain Assessment Details Patient Name: Date of Service: [name] Barnett, [name] Barnett 04/15/2021 10:15 A M Medical Record Number: [id number] Patient Account Number: [id number] Date of Birth/Sex: Treating RN: 10/06/1980 (52 y.o. F) Dellie Catholic Primary Care April Colter: SITA FA LWA LLA, SHA RMIN Other Clinician: Referring Coraleigh Sheeran: Treating Ayse Mccartin/Extender: Linton Ham SITA FA LWA LLA, SHA RMIN Weeks in Treatment: 15 Active Problems Location of Pain Severity and Description of Pain Patient Has Paino Yes Site Locations Pain Location: Generalized Pain With Dressing Change: No Duration of the Pain. Constant / Intermittento Constant Rate the pain. Current Pain Level: 7 Worst Pain Level: 10 Least Pain Level: 7 Tolerable Pain Level: 10 Character of Pain Describe the Pain: Aching Pain Management and Medication Current Pain Management: Medication: Yes Cold Application: No Rest: Yes Massage: No Activity: No T.E.N.S.: No Heat Application: No Leg drop or elevation: No Is the Current Pain Management Adequate: Adequate How does your wound impact your activities of daily livingo Sleep: No Bathing: No Appetite: No Relationship With Others: No Bladder Continence: No Emotions: No Bowel Continence: No Work: No Toileting: No Drive: No Dressing: No Hobbies: No Electronic Signature(s) Signed: 04/15/2021 5:23:39 PM By: Dellie Catholic RN  Entered By: Dellie Catholic on 04/15/2021 10:37:55 -------------------------------------------------------------------------------- Patient/Caregiver Education Details Patient Name: Date of Service: [name] Barnett, [name] Barnett 12/14/2022andnbsp10:15 [place] Record Number: [id number] Patient Account Number: [id number] Date of Birth/Gender: Treating RN: 15-Mar-1981 (40 y.o. [name] Primary Care Physician: SITA FA LWA LLA, SHA RMIN Other Clinician: Referring Physician: Treating Physician/Extender: Linton Ham SITA FA LWA LLA, SHA RMIN Weeks in Treatment: 15 Education Assessment Education Provided To: Patient Education Topics Provided Pressure: Methods: Explain/Verbal Responses: Reinforcements needed, State content correctly Wound/Skin Impairment: Methods: Explain/Verbal Responses: Reinforcements needed, State content correctly Electronic Signature(s) Signed: 04/15/2021 4:24:09 PM By: [name] RN, BSN Entered By: [name] on 04/15/2021 11:23:16 -------------------------------------------------------------------------------- Wound Assessment Details Patient Name: Date of Service: [name] Barnett, [name] Barnett 04/15/2021 10:15 A M Medical Record Number: [id number] Patient Account Number: [id number] Date of Birth/Sex: Treating RN: November 30, 1980 (40 y.o. F) Scotton, Mechele Claude Primary Care Mea Ozga: SITA FA LWA LLA, SHA RMIN Other Clinician: Referring Yuuki Skeens: Treating Suleman Gunning/Extender: Linton Ham SITA FA LWA LLA, SHA RMIN Weeks in Treatment: 15 Wound Status Wound Number: 6 Primary Pressure Ulcer Etiology: Wound Location: Perineum Wound Open Wounding Event: Pressure Injury Status: Date Acquired: 02/04/2021 Comorbid Cataracts, Glaucoma, Anemia, Congestive Heart Failure, Weeks Of Treatment: 10 History: Hypertension, Type II Diabetes, End Stage Renal Disease, Clustered Wound: No Neuropathy, Confinement Anxiety Wound Measurements Length: (cm) 3.2 Width: (cm) 1.1 Depth: (cm) 0.4 Area: (cm) 2.765 Volume: (cm) 1.106 % Reduction in Area: -57.2% % Reduction in Volume: -214.2% Epithelialization: None Tunneling: No Undermining: No Wound Description Classification: Category/Stage II Wound Margin: Flat and Intact Exudate Amount: Medium Exudate Type: Serosanguineous Exudate Color: red, brown Foul Odor After Cleansing:  No Slough/Fibrino No Wound Bed Granulation Amount: Large (67-100%) Exposed Structure Granulation Quality: Pink Fascia Exposed: No Necrotic Amount: Small (1-33%) Fat Layer (Subcutaneous Tissue) Exposed: Yes Necrotic Quality: Adherent Slough Tendon Exposed: No Muscle Exposed: No Joint Exposed: No Bone Exposed: No Treatment Notes Wound #6 (Perineum) Cleanser Wound Cleanser Discharge Instruction: Cleanse the wound with wound cleanser prior to applying a clean dressing using gauze sponges, not tissue or cotton balls. Peri-Wound Care Skin Prep Discharge Instruction: Use skin prep as directed Topical Triple Antibiotic Ointment, 1 (oz) Tube Primary Dressing AquacelAg Advantage Dressing, 2X2 (in/in) Secondary Dressing Zetuvit Plus Silicone Border Dressing 4x4 (in/in) Discharge Instruction: Apply silicone border over primary dressing as directed. Secured With Compression Wrap Compression Stockings Environmental education officer) Signed: 04/15/2021 5:23:39 PM By: Dellie Catholic RN Entered By: Dellie Catholic on 04/15/2021 10:53:51 -------------------------------------------------------------------------------- Vitals Details Patient Name: Date of Service: [name] Barnett, Alexander 04/15/2021 10:15 A M Medical Record Number: [id number] Patient Account Number: [id number] Date of Birth/Sex: Treating RN: [birth year] (40 y.o. F) Scotton, Mechele Claude Primary Care Maylen Waltermire: SITA FA LWA LLA, SHA RMIN Other Clinician: Referring Samanda Buske: Treating Osias Resnick/Extender: Linton Ham SITA FA LWA LLA, SHA RMIN Weeks in Treatment: 15 Vital Signs Time Taken: 10:36 Temperature (F): 98.6 Pulse (bpm): 92 Respiratory Rate (breaths/min): 18 Blood Pressure (mmHg): 123/82 Reference Range: 80 - 120 mg / dl Electronic Signature(s) Signed: 04/15/2021 5:23:39 PM By: Dellie Catholic RN Entered By: Dellie Catholic on 04/15/2021 10:36:50

## 2021-04-15 NOTE — Progress Notes (Signed)
[name] Barnett, [name] Barnett ([id number]) Visit Report for 04/15/2021 Debridement Details Patient Name: Date of Service: [name] Barnett, [name] Barnett 04/15/2021 10:15 A M Medical Record Number: [id number] Patient Account Number: [id number] Date of Birth/Sex: Treating RN: 02/01/1981 (40 y.o. [name] Primary Care Provider: SITA FA LWA LLA, SHA RMIN Other Clinician: Referring Provider: Treating Provider/Extender: Linton Ham SITA FA LWA LLA, SHA RMIN Weeks in Treatment: 15 Debridement Performed for Assessment: Wound #6 Perineum Performed By: Physician Ricard Dillon., MD Debridement Type: Debridement Level of Consciousness (Pre-procedure): Awake and Alert Pre-procedure Verification/Time Out Yes - 10:55 Taken: Start Time: 10:55 Pain Control: Other : benzocaine 20% spray T Area Debrided (L x W): otal 3.2 (cm) x 1.1 (cm) = 3.52 (cm) Tissue and other material debrided: Viable, Non-Viable, Slough, Subcutaneous, Slough Level: Skin/Subcutaneous Tissue Debridement Description: Excisional Instrument: Curette Bleeding: Minimum Hemostasis Achieved: Pressure Procedural Pain: 7 Post Procedural Pain: 3 Response to Treatment: Procedure was tolerated well Level of Consciousness (Post- Awake and Alert procedure): Post Debridement Measurements of Total Wound Length: (cm) 3.2 Stage: Category/Stage II Width: (cm) 1.1 Depth: (cm) 0.4 Volume: (cm) 1.106 Character of Wound/Ulcer Post Debridement: Improved Post Procedure Diagnosis Same as Pre-procedure Electronic Signature(s) Signed: 04/15/2021 4:51:07 PM By: Linton Ham MD Signed: 04/15/2021 5:40:46 PM By: Levan Hurst RN, BSN Entered By: Linton Ham on 04/15/2021 11:07:33 -------------------------------------------------------------------------------- HPI Details Patient Name: Date of Service: [name] Barnett, [name] Barnett 04/15/2021 10:15 A M Medical Record Number: [id number] Patient Account Number: [id number] Date of Birth/Sex:  Treating RN: 1980/09/19 (40 y.o. [name] Primary Care Provider: SITA FA LWA LLA, SHA RMIN Other Clinician: Referring Provider: Treating Provider/Extender: Linton Ham SITA FA LWA LLA, SHA RMIN Weeks in Treatment: 15 History of Present Illness HPI Description: Admission 06/15/2019 This is a 40 year old woman who comes to [place] from [facility] skilled facility in [place]. I am able to follow a lot of her history on care everywhere in epic. The patient is a type II diabetic and has been on dialysis. 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 I am not exactly sure of the nature of the stroke at this present time. She has made a decent recovery and is now up walking with a walker. In August 2020 she was admitted to [facility] in [place] with L4-L5 discitis. Culture and sensitivity showed Klebsiella. She was discharged on 8 weeks of ceftazidime. In mid September she is readmitted to the hospital with a pressure ulcer on her lower sacrum and necrotizing fasciitis. She required an extensive surgical debridement. CT scan of the pelvis at 1 point showed communication with the rectum. A colostomy was recommended and placed. Culture of this area showed Klebsiella and yeast. She was discharged on an extended course of meropenem and Diflucan. [facility] [place] We do not have any of these records. I think she was discharged to another nursing home but is come to [facility] skilled facility in [facility]. She was sent down here for review of the remaining wound. By review of the pictures that the patient had from September this is made considerable improvement. She tells me that the wound VAC was discontinued at the end of December 2020. Currently the facility is using a form of silver alginate to the wound surface. Past medical history type 2  diabetes with chronic renal failure on dialysis Tuesday Thursday and Saturday. CVA with left hemiparesis in June  2020 but she appears to be making a good recovery. Chronic diastolic heart failure, hypertension 2/19; patient's wound area slightly smaller. The area that was towards her anus has closed over. We are using Santyl covered with Hydrofera Blue. We have not been able to get her lab work apparently her veins are very difficult for phlebotomy. They are trying to get the blood work done where she dialyzes in [facility] but that requires some logistical issues. 2/26; not much change in the wound. Still a nonviable surface we have been using Santyl and Hydrofera Blue. I changed her to [facility] only. She tells me she has rigorously offloading this area she also complains of some pain 3/5; still no change here. I switch to pure Hydrofera Blue. She still complains of a lot of pain although is a [name] wound it seems out of proportion to what I might expect. This was initially a surgical wound secondary to necrotizing wound infection. She is on dialysis. Other than that she thinks she is offloading this fairly rigorously. She has not been systemically unwell 3/26; I switched to Iodoflex 3 weeks ago. This really seems to have helped, much better surface and slight improvement in the surface area. 4/26; the patient is still on Iodoflex she missed an appointment 2 weeks ago. Doing slightly smaller. She is on dialysis 5/10; using Hydrofera Blue. The major improvement here is the complete lack of depth. She has islands of epithelialization 09/24/19-Patient comes in at 2 weeks, dimensions about the same, there is an [place] of epithelium at margin of wound, no slough noted 10/15/2019 upon evaluation today patient appears to be doing well with regard to her wound. She has been tolerating the dressing changes in the sacral region without complication and at this point she seems to be healing quite  nicely. I am extremely pleased with how things are progressing and the epithelial tissue seems to be spreading from the central portion as well which is also excellent. 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 We have been using Hydrofera Blue. The smaller area which has depth at 0.7 cm there is a question about whether they have been packing anything into this or just flaring Hydrofera Blue over the top 9/23; its been more than 2 months since this patient was here. She still has a small area distally which is the remanent of her stage IV wound. They have been using Hydrofera Blue. She also has an area on the left gluteal 10/22; 1 month follow-up. I think the original sacral wound is closed however the patient has 2 areas distally 1 over the coccyx a small pinpoint area and the other into the gluteal cleft. Neither 1 of these looks particularly infected. I thought they might be connected but they were not. 11/15; the original sacral wound is closed patient only has 1 remaining area. 1 of these is in the gluteal cleft just below the coccyx. The area over the coccyx from last time I think is all so closed 12/31/2020; this is a patient that we saw for a pressure ulcer over her lower sacrum and coccyx for a long period of time in 2021. She eventually healed over. She is at [facility] skilled facility largely secondary to a remote CVA she had. She is also end-stage renal disease on dialysis secondary to type 2 diabetes. She tells [place] that since about mid June she has had an area more distally  than the original sacrum/coccyx wound it looks as though they are putting silver alginate in this area. She is limited in her ability to offload this I think spending most of her time in a wheelchair although she can stand to transfer. She has an ostomy During her original stay here in 2021 I also list that she had a more distal area into her perineum that  closed over well before the more proximal coccyx/sacral wound. She had a CT scan of the abdomen and pelvis on 12/01/2020. This did note first chronic sacral decubitus along the right aspect of her distal sacrum and coccyx. There was not felt to be bone destruction she has chronic changes of bilateral sacroiliitis and chronic endplate destruction and sclerosis at L4-L5 secondary to previous osteomyelitis however no new findings were felt to be seen in terms of bone problems in this area 01/14/2021; 2-week follow-up. Her wound is smaller essentially in the coccyx area. We have been using silver collagen. 10/5; 3-week follow-up. The patient returns with her area in the coccyx healed however distally in the perineum there is a fairly long stage II area. I am assuming this patient's wound is a pressure related area. Although this would not be a usual pressure area for a normal situation her immobility secondary to remote CVA, dialysis etc. make her at risk for pressure areas in an otherwise unusual setting. The patient is fairly upset stating that we were told about this last time. I do not have this in my records. Her original wound was small and just about healed at that point and this visit was arranged in follow-up. If there was a new wound more distally into her perineum we did not look at it 11/2; patient's wound is slightly smaller. This is in the gluteal cleft on the left. Using silver alginate. She tells me she is aggressively offloading this area. 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 We have been using silver alginate. Arrives in clinic today with the wound larger. 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 We have been using silver alginate.  PCR culture I did last week was polymicrobial with multiple organisms we put Polysporin under the silver alginate at the facility. According to the patient this got started Electronic Signature(s) Signed: 04/15/2021 4:51:07 PM By: Linton Ham MD Entered By: Linton Ham on 04/15/2021 11:08:15 -------------------------------------------------------------------------------- Physical Exam Details Patient Name: Date of Service: [name] Barnett, [name] Barnett 04/15/2021 10:15 A M Medical Record Number: [id number] Patient Account Number: [id number] Date of Birth/Sex: Treating RN: 1981-03-18 (40 y.o. [name] Primary Care Provider: SITA FA LWA LLA, SHA RMIN Other Clinician: Referring Provider: Treating Provider/Extender: Linton Ham SITA FA LWA LLA, SHA RMIN Weeks in Treatment: 15 Constitutional Sitting or standing Blood Pressure is within target range for patient.. Pulse regular and within target range for patient.Marland Kitchen Respirations regular, non-labored and within target range.. Temperature is normal and within the target range for the patient.Marland Kitchen Appears in no distress. Notes Wound exam; the area on the left buttock in the gluteal cleft. Surface does not visibly look too bad. Certainly no evidence of surrounding infection. Given a extensive nature of this and the multi bacteria we cultured last time I did a debridement using a #5  curette. There is a gritty surface on this hemostasis with direct pressure Electronic Signature(s) Signed: 04/15/2021 4:51:07 PM By: Linton Ham MD Entered By: Linton Ham on 04/15/2021 11:10:06 -------------------------------------------------------------------------------- Physician Orders Details Patient Name: Date of Service: [name] Barnett, [name] Barnett 04/15/2021 10:15 A M Medical Record Number: [id number] Patient Account Number: [id number] Date of Birth/Sex: Treating RN: [birth year] (40 y.o. [name] Primary Care Provider: SITA FA LWA LLA,  SHA RMIN Other Clinician: Referring Provider: Treating Provider/Extender: Linton Ham SITA FA LWA LLA, SHA RMIN Weeks in Treatment: 15 Verbal / Phone Orders: No Diagnosis Coding Follow-up Appointments Return appointment in 3 weeks. - Dr. Dellia Nims Bathing/ Shower/ Hygiene May shower with protection but do not get wound dressing(s) wet. Off-Loading Turn and reposition every 2 hours Non Wound Condition Other Non Wound Condition Orders/Instructions: - Apply nystatin powder and gauze between right and left gluteus Wound Treatment Wound #6 - Perineum Cleanser: Wound Cleanser Every Other Day/30 Days Discharge Instructions: Cleanse the wound with wound cleanser prior to applying a clean dressing using gauze sponges, not tissue or cotton balls. Peri-Wound Care: Skin Prep Every Other Day/30 Days Discharge Instructions: Use skin prep as directed Prim Dressing: [place], 2X2 (in/in) ary Every Other Day/30 Days Secondary Dressing: Zetuvit Plus Silicone Border Dressing 4x4 (in/in) Every Other Day/30 Days Discharge Instructions: Apply silicone border over primary dressing as directed. Patient Medications llergies: lisinopril, morphine, corn A Notifications Medication Indication Start End prior to debridement 04/15/2021 benzocaine DOSE topical 20 % aerosol - aerosol topical Electronic Signature(s) Signed: 04/15/2021 4:24:09 PM By: [name] RN, BSN Signed: 04/15/2021 4:51:07 PM By: Linton Ham MD Entered By: [name] on 04/15/2021 11:06:46 -------------------------------------------------------------------------------- Problem List Details Patient Name: Date of Service: [name] Barnett, [name] Barnett 04/15/2021 10:15 A M Medical Record Number: [id number] Patient Account Number: [id number] Date of Birth/Sex: Treating RN: Jun 25, 1980 (40 y.o. [name] Primary Care Provider: SITA FA LWA LLA, SHA RMIN Other Clinician: Referring Provider: Treating  Provider/Extender: Linton Ham SITA FA LWA LLA, SHA RMIN Weeks in Treatment: 15 Active Problems ICD-10 Encounter Code Description Active Date MDM Diagnosis L89.322 Pressure ulcer of left buttock, stage 2 04/01/2021 No Yes G82.22 Paraplegia, incomplete 03/04/2021 No Yes Inactive Problems ICD-10 Code Description Active Date Inactive Date L89.312 Pressure ulcer of right buttock, stage 2 12/31/2020 12/31/2020 L89.152 Pressure ulcer of sacral region, stage 2 02/04/2021 02/04/2021 Resolved Problems Electronic Signature(s) Signed: 04/15/2021 4:51:07 PM By: Linton Ham MD Entered By: Linton Ham on 04/15/2021 11:07:13 -------------------------------------------------------------------------------- Progress Note Details Patient Name: Date of Service: [name] Barnett, [place] 04/15/2021 10:15 A M Medical Record Number: [id number] Patient Account Number: [id number] Date of Birth/Sex: Treating RN: 1980-07-02 (40 y.o. [name] Primary Care Provider: SITA FA LWA LLA, SHA RMIN Other Clinician: Referring Provider: Treating Provider/Extender: Linton Ham SITA FA LWA LLA, SHA RMIN Weeks in Treatment: 15 Subjective History of Present Illness (HPI) Admission 06/15/2019 This is a 40 year old woman who comes to [place] from [facility] skilled facility in [facility]. I am able to follow a lot of her history on care everywhere in epic. The patient is a type II diabetic and has been on dialysis. 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 I am not exactly sure of the nature of the stroke at this present time. She has made a decent recovery and is now up walking with a walker. In August 2020 she was admitted to [facility] in [place] with  L4-L5 discitis. Culture and sensitivity showed Klebsiella. She was discharged on 8 weeks of ceftazidime. In mid September she is readmitted to the hospital with a  pressure ulcer on her lower sacrum and necrotizing fasciitis. She required an extensive surgical debridement. CT scan of the pelvis at 1 point showed communication with the rectum. A colostomy was recommended and placed. Culture of this area showed Klebsiella and yeast. She was discharged on an extended course of meropenem and Diflucan. [facility] [place]. We do not have any of these records. I think she was discharged to another nursing home but is come to [facility] skilled facility in [facility]. She was sent down here for review of the remaining wound. By review of the pictures that the patient had from September this is made considerable improvement. She tells me that the wound VAC was discontinued at the end of December 2020. Currently the facility is using a form of silver alginate to the wound surface. Past medical history type 2 diabetes with chronic renal failure on dialysis Tuesday Thursday and Saturday. CVA with left hemiparesis in June 2020 but she appears to be making a good recovery. Chronic diastolic heart failure, hypertension 2/19; patient's wound area slightly smaller. The area that was towards her anus has closed over. We are using Santyl covered with Hydrofera Blue. We have not been able to get her lab work apparently her veins are very difficult for phlebotomy. They are trying to get the blood work done where she dialyzes in [facility] but that requires some logistical issues. 2/26; not much change in the wound. Still a nonviable surface we have been using Santyl and Hydrofera Blue. I changed her to [facility] only. She tells me she has rigorously offloading this area she also complains of some pain 3/5; still no change here. I switch to pure Hydrofera Blue. She still complains of a lot of pain although is a [name] wound it seems out of proportion to what I might expect. This was initially a surgical wound  secondary to necrotizing wound infection. She is on dialysis. Other than that she thinks she is offloading this fairly rigorously. She has not been systemically unwell 3/26; I switched to Iodoflex 3 weeks ago. This really seems to have helped, much better surface and slight improvement in the surface area. 4/26; the patient is still on Iodoflex she missed an appointment 2 weeks ago. Doing slightly smaller. She is on dialysis 5/10; using Hydrofera Blue. The major improvement here is the complete lack of depth. She has islands of epithelialization 09/24/19-Patient comes in at 2 weeks, dimensions about the same, there is an [place] of epithelium at margin of wound, no slough noted 10/15/2019 upon evaluation today patient appears to be doing well with regard to her wound. She has been tolerating the dressing changes in the sacral region without complication and at this point she seems to be healing quite nicely. I am extremely pleased with how things are progressing and the epithelial tissue seems to be spreading from the central portion as well which is also excellent. 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 We have been using Hydrofera Blue. The smaller area which has depth at 0.7 cm there is a question about whether they have been packing anything into this or just flaring Hydrofera Blue over the top 9/23; its been more than 2 months since this patient  was here. She still has a small area distally which is the remanent of her stage IV wound. They have been using Hydrofera Blue. She also has an area on the left gluteal 10/22; 1 month follow-up. I think the original sacral wound is closed however the patient has 2 areas distally 1 over the coccyx a small pinpoint area and the other into the gluteal cleft. Neither 1 of these looks particularly infected. I thought they might be connected but they were not. 11/15; the original sacral wound is  closed patient only has 1 remaining area. 1 of these is in the gluteal cleft just below the coccyx. The area over the coccyx from last time I think is all so closed 12/31/2020; this is a patient that we saw for a pressure ulcer over her lower sacrum and coccyx for a long period of time in 2021. She eventually healed over. She is at [facility] skilled facility largely secondary to a remote CVA she had. She is also end-stage renal disease on dialysis secondary to type 2 diabetes. She tells [place] that since about mid June she has had an area more distally than the original sacrum/coccyx wound it looks as though they are putting silver alginate in this area. She is limited in her ability to offload this I think spending most of her time in a wheelchair although she can stand to transfer. She has an ostomy During her original stay here in 2021 I also list that she had a more distal area into her perineum that closed over well before the more proximal coccyx/sacral wound. She had a CT scan of the abdomen and pelvis on 12/01/2020. This did note first chronic sacral decubitus along the right aspect of her distal sacrum and coccyx. There was not felt to be bone destruction she has chronic changes of bilateral sacroiliitis and chronic endplate destruction and sclerosis at L4-L5 secondary to previous osteomyelitis however no new findings were felt to be seen in terms of bone problems in this area 01/14/2021; 2-week follow-up. Her wound is smaller essentially in the coccyx area. We have been using silver collagen. 10/5; 3-week follow-up. The patient returns with her area in the coccyx healed however distally in the perineum there is a fairly long stage II area. I am assuming this patient's wound is a pressure related area. Although this would not be a usual pressure area for a normal situation her immobility secondary to remote CVA, dialysis etc. make her at risk for pressure areas in an otherwise unusual setting. The  patient is fairly upset stating that we were told about this last time. I do not have this in my records. Her original wound was small and just about healed at that point and this visit was arranged in follow-up. If there was a new wound more distally into her perineum we did not look at it 11/2; patient's wound is slightly smaller. This is in the gluteal cleft on the left. Using silver alginate. She tells me she is aggressively offloading this area. 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 We have been using silver alginate. Arrives in clinic today with the wound larger. 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 We have been using silver alginate. PCR  culture I did last week was polymicrobial with multiple organisms we put Polysporin under the silver alginate at the facility. According to the patient this got started Objective Constitutional Sitting or standing Blood Pressure is within target range for patient.. Pulse regular and within target range for patient.Marland Kitchen Respirations regular, non-labored and within target range.. Temperature is normal and within the target range for the patient.Marland Kitchen Appears in no distress. Vitals Time Taken: 10:36 AM, Temperature: 98.6 F, Pulse: 92 bpm, Respiratory Rate: 18 breaths/min, Blood Pressure: 123/82 mmHg. General Notes: Wound exam; the area on the left buttock in the gluteal cleft. Surface does not visibly look too bad. Certainly no evidence of surrounding infection. Given a extensive nature of this and the multi bacteria we cultured last time I did a debridement using a #5 curette. There is a gritty surface on this hemostasis with direct pressure Integumentary (Hair, Skin) Wound #6 status is Open. Original  cause of wound was Pressure Injury. The date acquired was: 02/04/2021. The wound has been in treatment 10 weeks. The wound is located on the Perineum. The wound measures 3.2cm length x 1.1cm width x 0.4cm depth; 2.765cm^2 area and 1.106cm^3 volume. There is Fat Layer (Subcutaneous Tissue) exposed. There is no tunneling or undermining noted. There is a medium amount of serosanguineous drainage noted. The wound margin is flat and intact. There is large (67-100%) pink granulation within the wound bed. There is a small (1-33%) amount of necrotic tissue within the wound bed including Adherent Slough. Assessment Active Problems ICD-10 Pressure ulcer of left buttock, stage 2 Paraplegia, incomplete Procedures Wound #6 Pre-procedure diagnosis of Wound #6 is a Pressure Ulcer located on the Perineum . There was a Excisional Skin/Subcutaneous Tissue Debridement with a total area of 3.52 sq cm performed by Ricard Dillon., MD. With the following instrument(s): Curette to remove Viable and Non-Viable tissue/material. Material removed includes Subcutaneous Tissue and Slough and after achieving pain control using Other (benzocaine 20% spray). No specimens were taken. A time out was conducted at 10:55, prior to the start of the procedure. A Minimum amount of bleeding was controlled with Pressure. The procedure was tolerated well with a pain level of 7 throughout and a pain level of 3 following the procedure. Post Debridement Measurements: 3.2cm length x 1.1cm width x 0.4cm depth; 1.106cm^3 volume. Post debridement Stage noted as Category/Stage II. Character of Wound/Ulcer Post Debridement is improved. Post procedure Diagnosis Wound #6: Same as Pre-Procedure Plan Follow-up Appointments: Return appointment in 3 weeks. - Dr. Dellia Nims Bathing/ Shower/ Hygiene: May shower with protection but do not get wound dressing(s) wet. Off-Loading: Turn and reposition every 2 hours Non Wound Condition: Other Non Wound  Condition Orders/Instructions: - Apply nystatin powder and gauze between right and left gluteus The following medication(s) was prescribed: benzocaine topical 20 % aerosol aerosol topical for prior to debridement was prescribed at facility WOUND #6: - Perineum Wound Laterality: Cleanser: Wound Cleanser Every Other Day/30 Days Discharge Instructions: Cleanse the wound with wound cleanser prior to applying a clean dressing using gauze sponges, not tissue or cotton balls. Peri-Wound Care: Skin Prep Every Other Day/30 Days Discharge Instructions: Use skin prep as directed Prim Dressing: [place], 2X2 (in/in) Every Other Day/30 Days ary Secondary Dressing: Zetuvit Plus Silicone Border Dressing 4x4 (in/in) Every Other Day/30 Days Discharge Instructions: Apply silicone border over primary dressing as directed. 1. Debridement because of the results of PCR culturing. 2. Continue topical antibiotics under silver alginate 3. There was no way, and no need,  to address the PCR culture with systemic antibiotics. More frequent debridement might be nice although this would be difficult to arrange with the patient's facility being in [place] Signature(s) Signed: 04/15/2021 4:51:07 PM By: Linton Ham MD Entered By: Linton Ham on 04/15/2021 11:16:41 -------------------------------------------------------------------------------- SuperBill Details Patient Name: Date of Service: [name] Barnett, [name] Barnett 04/15/2021 Medical Record Number: [id number] Patient Account Number: [id number] Date of Birth/Sex: Treating RN: 03/29/1981 (40 y.o. [name] Primary Care Provider: SITA FA LWA LLA, SHA RMIN Other Clinician: Referring Provider: Treating Provider/Extender: Linton Ham SITA FA LWA LLA, SHA RMIN Weeks in Treatment: 15 Diagnosis Coding ICD-10 Codes Code Description L89.322 Pressure ulcer of left buttock, stage 2 G82.22 Paraplegia,  incomplete Facility Procedures CPT4 Code: [id number] Description: 38756 - DEB SUBQ TISSUE 20 SQ CM/< ICD-10 Diagnosis Description L89.322 Pressure ulcer of left buttock, stage 2 G82.22 Paraplegia, incomplete Modifier: Quantity: 1 Physician Procedures : CPT4 Code Description Modifier [id number] 11042 - WC PHYS SUBQ TISS 20 SQ CM ICD-10 Diagnosis Description L89.322 Pressure ulcer of left buttock, stage 2 G82.22 Paraplegia, incomplete Quantity: 1 Electronic Signature(s) Signed: 04/15/2021 4:51:07 PM By: Linton Ham MD Entered By: Linton Ham on 04/15/2021 11:16:56

## 2021-04-15 NOTE — Therapy (Signed)
[place], [place], [address] Phone: [phone]   Fax:  [phone]  Physical Therapy Treatment  Patient Details  Name: [name] Barnett MRN: [id number] Date of Birth: June 27, 1980 Referring Provider (PT): Leonides Grills   Encounter Date: 04/15/2021   PT End of Session - 04/15/21 1510     Visit Number 4    Number of Visits 12    Date for PT Re-Evaluation 05/01/21    Authorization Type medicare/medicaid 80/20    Progress Note Due on Visit 10    PT Start Time 5701    PT Stop Time 7793    PT Time Calculation (min) 45 min             Past Medical History:  Diagnosis Date   Diabetes mellitus without complication ([place])    Hyperlipidemia    Hypertension    Renal disorder    Stroke [facility])    Vitamin D deficiency     Past Surgical History:  Procedure Laterality Date   cardiac stents     CATARACT EXTRACTION W/PHACO Left 08/29/2020   Procedure: CATARACT EXTRACTION PHACO AND INTRAOCULAR LENS PLACEMENT LEFT EYE;  Surgeon: Baruch Goldmann, MD;  Location: [facility];  Service: Ophthalmology;  Laterality: Left;  left CDE=29.30   CATARACT EXTRACTION W/PHACO Right 11/28/2020   Procedure: CATARACT EXTRACTION PHACO AND INTRAOCULAR LENS PLACEMENT (IOC);  Surgeon: Baruch Goldmann, MD;  Location: [facility];  Service: Ophthalmology;  Laterality: Right;  CDE 4.35   COLOSTOMY     TUBAL LIGATION     wound on buttocks      There were no vitals filed for this visit.   Subjective Assessment - 04/15/21 1505     Subjective pt states she kept her bandages on until last night.  Reports overall comfort.    Currently in Pain? No/denies                   LYMPHEDEMA/ONCOLOGY QUESTIONNAIRE - 04/15/21 0001       Right Lower Extremity Lymphedema   20 cm Proximal to Suprapatella 57 cm   was 60.5cm at initial evaluation on 11/30   10 cm Proximal to Suprapatella 47 cm   was 47.5 cm   At Midpatella/Popliteal Crease 40 cm   was  39 cm   30 cm Proximal to Floor at Lateral Plantar Foot 35 cm   was 35.2 cm   20 cm Proximal to Floor at Lateral Plantar Foot 32.8 1   was 29.2 cm   10 cm Proximal to Floor at Lateral Malleoli 29.5 cm   was 28.2 cm   Circumference of ankle/heel 32.7 cm.   was 33 cm   5 cm Proximal to 1st MTP Joint 24.8 cm   was 23.8 cm   Across MTP Joint 25 cm   was 24.7 cm   Around Proximal Great Toe 9.3 cm   was 8.9 cm                       [facility] Adult PT Treatment/Exercise - 04/15/21 0001       Manual Therapy   Manual Therapy Compression Bandaging;Manual Lymphatic Drainage (MLD);Other (comment)    Manual therapy comments done seperate from all other aspects of treatment    Manual Lymphatic Drainage (MLD) To include supraclavicular, deep and superfical abdominal, Rt inguinal axillary and intrainguinal anastomosis as well as anterior and posterior RT LE ; posterior completed semiprone.  Compression Bandaging Foam and multilayer short stretch bandages from foot to knee of RT LE    Other Manual Therapy MEASUREMENTS                       PT Short Term Goals - 04/03/21 1537       PT SHORT TERM GOAL #1   Title PT to be able to vebalize the four aspects to control lymphedema.    Status On-going    Target Date 04/15/21               PT Long Term Goals - 04/03/21 1537       PT LONG TERM GOAL #1   Title PT LE measurements to be decreased 2-4 cm to reduce risk of cellulitis    Time 4    Period Weeks    Status On-going                   Plan - 04/15/21 1515     Clinical Impression Statement Measured Rt LE this session with overall increase as compared to inital evaluation X 2 weeks prior.  Pt has had the flu and did not get treatments during this time. Increased edema is possibly due to this. Skin is also very dry requiring several applications of lotion.  Pt wtill continue to benefit from manual lymph drainage and compression bandaging including her toes.     Personal Factors and Comorbidities Comorbidity 3+;Fitness    Comorbidities DM, renal disorder, CVA,    Examination-Activity Limitations Locomotion Level    Examination-Participation Restrictions Other    Stability/Clinical Decision Making Evolving/Moderate complexity    Rehab Potential Good    PT Frequency 3x / week    PT Duration 4 weeks    PT Treatment/Interventions Patient/family education;Manual techniques;Manual lymph drainage    PT Next Visit Plan continue total decongestive techniques measure on Wed.    PT Home Exercise Plan ankle pumps, LAQ, hip ab/adduction, marching, diaphragmic breath and lymph squeeze all x 10 rep B             Patient will benefit from skilled therapeutic intervention in order to improve the following deficits and impairments:  Decreased skin integrity, Increased edema  Visit Diagnosis: Lymphedema, not elsewhere classified  Difficulty in walking, not elsewhere classified     Problem List Patient Active Problem List   Diagnosis Date Noted   BV (bacterial vaginosis) 04/02/2020   Vaginal itching 04/02/2020   Vaginal odor 04/02/2020   Vaginal discharge 04/02/2020   Sepsis secondary to UTI ([place]) 01/13/2020   Acute respiratory failure with hypoxia ([place]) 01/13/2020   Right leg swelling 01/13/2020   Sacral decubitus ulcer 01/13/2020   Anemia due to end stage renal disease ([place]) 01/13/2020   Essential hypertension 01/13/2020   Hyperlipidemia 01/13/2020   Type 2 diabetes mellitus ([place]) 01/13/2020   Fleet Higham B Mare Ferrari, PTA/CLT, WTA [phone]   [name], PTA 04/15/2021, 3:20 PM  [facility] [address] [place], [place], [address] Phone: [phone]   Fax:  [phone]  Name: [name] Barnett MRN: [id number] Date of Birth: 04/12/1981

## 2021-04-17 ENCOUNTER — Encounter (HOSPITAL_COMMUNITY): Payer: Self-pay

## 2021-04-17 ENCOUNTER — Ambulatory Visit (HOSPITAL_COMMUNITY): Payer: Medicare Other

## 2021-04-17 ENCOUNTER — Other Ambulatory Visit: Payer: Self-pay

## 2021-04-17 DIAGNOSIS — I890 Lymphedema, not elsewhere classified: Secondary | ICD-10-CM | POA: Diagnosis not present

## 2021-04-17 DIAGNOSIS — R262 Difficulty in walking, not elsewhere classified: Secondary | ICD-10-CM

## 2021-04-17 NOTE — Therapy (Signed)
[facility] [address] [place], [place], [address] Phone: [phone]   Fax:  [phone]  Physical Therapy Treatment  Patient Details  Name: [name] Barnett MRN: [id number] Date of Birth: [birth year] Referring Provider (PT): Leonides Grills   Encounter Date: 04/17/2021   PT End of Session - 04/17/21 1541     Visit Number 5    Number of Visits 12    Date for PT Re-Evaluation 05/01/21    Authorization Type medicare/medicaid 80/20    Progress Note Due on Visit 10    PT Start Time 1455    PT Stop Time 1535    PT Time Calculation (min) 40 min    Activity Tolerance Patient tolerated treatment well    Behavior During Therapy [facility] for tasks assessed/performed             Past Medical History:  Diagnosis Date   Diabetes mellitus without complication ([place])    Hyperlipidemia    Hypertension    Renal disorder    Stroke [facility])    Vitamin D deficiency     Past Surgical History:  Procedure Laterality Date   cardiac stents     CATARACT EXTRACTION W/PHACO Left 08/29/2020   Procedure: CATARACT EXTRACTION PHACO AND INTRAOCULAR LENS PLACEMENT LEFT EYE;  Surgeon: Baruch Goldmann, MD;  Location: [facility];  Service: Ophthalmology;  Laterality: Left;  left CDE=29.30   CATARACT EXTRACTION W/PHACO Right 11/28/2020   Procedure: CATARACT EXTRACTION PHACO AND INTRAOCULAR LENS PLACEMENT (IOC);  Surgeon: Baruch Goldmann, MD;  Location: [facility];  Service: Ophthalmology;  Laterality: Right;  CDE 4.35   COLOSTOMY     TUBAL LIGATION     wound on buttocks      There were no vitals filed for this visit.   Subjective Assessment - 04/17/21 1729     Subjective Pt stated she wants to take a break from Lymph care to get device to assist iwth standing, stated her insurance won't cover both.    Pertinent History DM, Renal disorder with active dialysis , CVA    Currently in Pain? Yes    Pain Score 8     Pain Location Back    Pain Orientation Lower    Pain  Descriptors / Indicators Aching    Pain Type Chronic pain                               [facility] Adult PT Treatment/Exercise - 04/17/21 0001       Manual Therapy   Manual Therapy Manual Lymphatic Drainage (MLD);Compression Bandaging    Manual therapy comments done seperate from all other aspects of treatment    Manual Lymphatic Drainage (MLD) To include supraclavicular, deep and superfical abdominal, Rt inguinal axillary and intrainguinal anastomosis as well as anterior and posterior RT LE ; posterior completed semiprone.    Compression Bandaging Foam and multilayer short stretch bandages from foot to knee of RT LE    Other Manual Therapy [facility] handout given wtih measurements                       PT Short Term Goals - 04/03/21 1537       PT SHORT TERM GOAL #1   Title PT to be able to vebalize the four aspects to control lymphedema.    Status On-going    Target Date 04/15/21  PT Long Term Goals - 04/03/21 1537       PT LONG TERM GOAL #1   Title PT LE measurements to be decreased 2-4 cm to reduce risk of cellulitis    Time 4    Period Weeks    Status On-going                   Plan - 04/17/21 1732     Clinical Impression Statement Discussed putting lymph therapy on hold for device to assist with gait, pt educated on consequences of going without compression.  Measured pt for compression garments and given [employer] handout.  Pt agreed to continues therapy until receives garment then plans to put on hold.  Manual lymphedema decongestive techniques complete anterior and posterior followed by application of multilayer short stretch bandages to Rt knee.  Reports of comfort at [facility].    Personal Factors and Comorbidities Comorbidity 3+;Fitness    Comorbidities DM, renal disorder, CVA,    Examination-Activity Limitations Locomotion Level    Examination-Participation Restrictions Other    Stability/Clinical Decision  Making Evolving/Moderate complexity    Clinical Decision Making Moderate    Rehab Potential Good    PT Frequency 3x / week    PT Duration 4 weeks    PT Treatment/Interventions Patient/family education;Manual techniques;Manual lymph drainage    PT Next Visit Plan continue total decongestive techniques measure on Wed.    PT Home Exercise Plan ankle pumps, LAQ, hip ab/adduction, marching, diaphragmic breath and lymph squeeze all x 10 rep B    Consulted and Agree with Plan of Care Patient             Patient will benefit from skilled therapeutic intervention in order to improve the following deficits and impairments:  Decreased skin integrity, Increased edema  Visit Diagnosis: Lymphedema, not elsewhere classified  Difficulty in walking, not elsewhere classified     Problem List Patient Active Problem List   Diagnosis Date Noted   BV (bacterial vaginosis) 04/02/2020   Vaginal itching 04/02/2020   Vaginal odor 04/02/2020   Vaginal discharge 04/02/2020   Sepsis secondary to UTI ([place]) 01/13/2020   Acute respiratory failure with hypoxia ([place]) 01/13/2020   Right leg swelling 01/13/2020   Sacral decubitus ulcer 01/13/2020   Anemia due to end stage renal disease ([place]) 01/13/2020   Essential hypertension 01/13/2020   Hyperlipidemia 01/13/2020   Type 2 diabetes mellitus ([place]) 01/13/2020   Ihor Austin, LPTA/CLT; CBIS [phone]  Aldona Lento, PTA 04/17/2021, 5:41 PM  [facility] [address] [place], [place], [address] Phone: [phone]   Fax:  [phone]  Name: [name] Barnett MRN: [id number] Date of Birth: 1981/01/23

## 2021-04-20 ENCOUNTER — Other Ambulatory Visit: Payer: Self-pay

## 2021-04-20 ENCOUNTER — Ambulatory Visit (HOSPITAL_COMMUNITY): Payer: Medicare Other | Admitting: Physical Therapy

## 2021-04-20 ENCOUNTER — Telehealth (HOSPITAL_COMMUNITY): Payer: Self-pay | Admitting: Physical Therapy

## 2021-04-20 DIAGNOSIS — I890 Lymphedema, not elsewhere classified: Secondary | ICD-10-CM

## 2021-04-20 DIAGNOSIS — R262 Difficulty in walking, not elsewhere classified: Secondary | ICD-10-CM

## 2021-04-20 NOTE — Therapy (Signed)
[place], [place], [address] Phone: [phone]   Fax:  [phone]  Physical Therapy Treatment  Patient Details  Name: [name] Barnett MRN: [id number] Date of Birth: [birth year] Referring Provider (PT): [name] Barnett   Encounter Date: 04/20/2021   PT End of Session - 04/20/21 1146     Visit Number 6    Number of Visits 12    Date for PT Re-Evaluation 05/01/21    Authorization Type medicare/medicaid 80/20    Progress Note Due on Visit 10    PT Start Time 7858    PT Stop Time 1130    PT Time Calculation (min) 45 min    Activity Tolerance Patient tolerated treatment well    Behavior During Therapy [facility] for tasks assessed/performed             Past Medical History:  Diagnosis Date   Diabetes mellitus without complication ([place])    Hyperlipidemia    Hypertension    Renal disorder    Stroke [facility])    Vitamin D deficiency     Past Surgical History:  Procedure Laterality Date   cardiac stents     CATARACT EXTRACTION W/PHACO Left 08/29/2020   Procedure: CATARACT EXTRACTION PHACO AND INTRAOCULAR LENS PLACEMENT LEFT EYE;  Surgeon: [name] Goldmann, MD;  Location: [facility];  Service: Ophthalmology;  Laterality: Left;  left CDE=29.30   CATARACT EXTRACTION W/PHACO Right 11/28/2020   Procedure: CATARACT EXTRACTION PHACO AND INTRAOCULAR LENS PLACEMENT (IOC);  Surgeon: [name] Goldmann, MD;  Location: [facility];  Service: Ophthalmology;  Laterality: Right;  CDE 4.35   COLOSTOMY     TUBAL LIGATION     wound on buttocks      There were no vitals filed for this visit.   Subjective Assessment - 04/20/21 1140     Subjective Pt states she is going home Friday morning so she will not be able to come in that day.  STates she may also need to cancel Mondays but depends on when she gets back.  STates she removed her compression Saturday night so she could wear shoes to church on Sunday.  States she has not ordered her  compression garments yet.    Currently in Pain? No/denies                               [facility] Adult PT Treatment/Exercise - 04/20/21 0001       Manual Therapy   Manual Therapy Manual Lymphatic Drainage (MLD);Compression Bandaging    Manual therapy comments done seperate from all other aspects of treatment    Manual Lymphatic Drainage (MLD) To include supraclavicular, deep and superfical abdominal, Rt inguinal axillary and intrainguinal anastomosis as well as anterior and posterior RT LE ; posterior completed semiprone.    Compression Bandaging Foam and multilayer short stretch bandages from foot to knee of RT LE    Other Manual Therapy knee high compression sock ordering information from [employer] given (Truform 20-30 mediums)                     PT Education - 04/20/21 1153     Education Details given information on comrpession stockings to purchase from [employer]) Educated Patient    Methods Explanation;Handout    Comprehension Verbalized understanding              PT Short Term Goals - 04/03/21  [place] #1   Title PT to be able to vebalize the four aspects to control lymphedema.    Status On-going    Target Date 04/15/21               PT Long Term Goals - 04/03/21 1537       PT LONG TERM GOAL #1   Title PT LE measurements to be decreased 2-4 cm to reduce risk of cellulitis    Time 4    Period Weeks    Status On-going                   Plan - 04/20/21 1146     Clinical Impression Statement Educated on importance of having some type of compression on LE while gone out of town to prevent return of edema.  Explained [facility] would be closed the next 2 weeks and given information on what type of stocking to order from [employer].  Pt states she will use a friends 2 day shipping so she will have it for out of town.  Continued with manual lymph drainage and compression bandaging for Rt LE.  Measure next  session    Personal Factors and Comorbidities Comorbidity 3+;Fitness    Comorbidities DM, renal disorder, CVA,    Examination-Activity Limitations Locomotion Level    Examination-Participation Restrictions Other    Stability/Clinical Decision Making Evolving/Moderate complexity    Rehab Potential Good    PT Frequency 3x / week    PT Duration 4 weeks    PT Treatment/Interventions Patient/family education;Manual techniques;Manual lymph drainage    PT Next Visit Plan continue total decongestive techniques measure on Wed.    PT Home Exercise Plan ankle pumps, LAQ, hip ab/adduction, marching, diaphragmic breath and lymph squeeze all x 10 rep B    Consulted and Agree with Plan of Care Patient             Patient will benefit from skilled therapeutic intervention in order to improve the following deficits and impairments:  Decreased skin integrity, Increased edema  Visit Diagnosis: Lymphedema, not elsewhere classified  Difficulty in walking, not elsewhere classified     Problem List Patient Active Problem List   Diagnosis Date Noted   BV (bacterial vaginosis) 04/02/2020   Vaginal itching 04/02/2020   Vaginal odor 04/02/2020   Vaginal discharge 04/02/2020   Sepsis secondary to UTI ([place]) 01/13/2020   Acute respiratory failure with hypoxia ([place]) 01/13/2020   Right leg swelling 01/13/2020   Sacral decubitus ulcer 01/13/2020   Anemia due to end stage renal disease ([place]) 01/13/2020   Essential hypertension 01/13/2020   Hyperlipidemia 01/13/2020   Type 2 diabetes mellitus ([place]) 01/13/2020   [name] Barnett, PTA/CLT, WTA [phone]  [name] Barnett, PTA 04/20/2021, 11:55 AM  [facility] [address] [place], [place], [address] Phone: [phone]   Fax:  [phone]  Name: [name] Barnett MRN: [id number] Date of Birth: August 28, 1980

## 2021-04-20 NOTE — Telephone Encounter (Signed)
She had to cx these days because she take dialysis on these dates 12/27 and 12/29

## 2021-04-22 ENCOUNTER — Ambulatory Visit (HOSPITAL_COMMUNITY): Payer: Medicare Other | Admitting: Physical Therapy

## 2021-04-22 ENCOUNTER — Telehealth (HOSPITAL_COMMUNITY): Payer: Self-pay | Admitting: Physical Therapy

## 2021-04-22 ENCOUNTER — Other Ambulatory Visit: Payer: Self-pay

## 2021-04-22 DIAGNOSIS — I890 Lymphedema, not elsewhere classified: Secondary | ICD-10-CM

## 2021-04-22 NOTE — Therapy (Signed)
[place], [place], [address] Phone: [phone]   Fax:  [phone]  Physical Therapy Treatment  Patient Details  Name: [name] Barnett MRN: [id number] Date of Birth: 30-Jun-1980 Referring Provider (PT): Leonides Grills  PHYSICAL THERAPY DISCHARGE SUMMARY  Visits from Start of Care: 7  Current functional level related to goals / functional outcomes: PT not completing HEP as she has lost her sheet.  No significant improvement in LE edema due to inconsistence in treatment.    Remaining deficits: Edema    Education / Equipment: HEP , pt has ordered compression garment and therapist has ordered compression pump.    Patient agrees to discharge. Patient goals were not met. Patient is being discharged due to the patient's request.  Encounter Date: 04/22/2021   PT End of Session - 04/22/21 1454     Visit Number 7    Number of Visits 7    Authorization Type medicare/medicaid 80/20    PT Start Time 1320    PT Stop Time 1403    PT Time Calculation (min) 43 min    Activity Tolerance Patient tolerated treatment well    Behavior During Therapy WFL for tasks assessed/performed             Past Medical History:  Diagnosis Date   Diabetes mellitus without complication ([place])    Hyperlipidemia    Hypertension    Renal disorder    Stroke [facility])    Vitamin D deficiency     Past Surgical History:  Procedure Laterality Date   cardiac stents     CATARACT EXTRACTION W/PHACO Left 08/29/2020   Procedure: CATARACT EXTRACTION PHACO AND INTRAOCULAR LENS PLACEMENT LEFT EYE;  Surgeon: Baruch Goldmann, MD;  Location: [facility];  Service: Ophthalmology;  Laterality: Left;  left CDE=29.30   CATARACT EXTRACTION W/PHACO Right 11/28/2020   Procedure: CATARACT EXTRACTION PHACO AND INTRAOCULAR LENS PLACEMENT (IOC);  Surgeon: Baruch Goldmann, MD;  Location: [facility];  Service: Ophthalmology;  Laterality: Right;  CDE 4.35   COLOSTOMY      TUBAL LIGATION     wound on buttocks      There were no vitals filed for this visit.   Subjective Assessment - 04/22/21 1315     Subjective Pt states that this will be her last visit as she is going to work on walking at the facility she is residing at therefore she is going to take a break from lymphedema treatment .  She has ordered her compression garements and the therapist has ordered her compression pump.    Pertinent History DM, Renal disorder with active dialysis , CVA    Currently in Pain? No/denies    Pain Score 7     Pain Location Back    Pain Orientation Lower    Pain Descriptors / Indicators Aching    Pain Type Chronic pain                   LYMPHEDEMA/ONCOLOGY QUESTIONNAIRE - 04/22/21 0001       Right Lower Extremity Lymphedema   20 cm Proximal to Suprapatella 58 cm   initial 60.5   10 cm Proximal to Suprapatella 48 cm   47   At Midpatella/Popliteal Crease 40.8 cm   39   30 cm Proximal to Floor at Lateral Plantar Foot 35.9 cm   35.2   20 cm Proximal to Floor at Lateral Plantar Foot 30.3 1   29.2   10 cm  Proximal to Floor at Lateral Malleoli 25.3 cm   28.2   Circumference of ankle/heel 33 cm.   33   5 cm Proximal to 1st MTP Joint 25 cm   23.8   Across MTP Joint 25.4 cm   24/7   Around Proximal Great Toe 9.2 cm   8.9     Left Lower Extremity Lymphedema   20 cm Proximal to Suprapatella 57 cm    10 cm Proximal to Suprapatella 46.3 cm    At Midpatella/Popliteal Crease 39 cm    30 cm Proximal to Floor at Lateral Plantar Foot 31.8 cm    20 cm Proximal to Floor at Lateral Plantar Foot 22.6 cm    10 cm Proximal to Floor at Lateral Malleoli 22 cm    Circumference of ankle/heel 30.9 cm.    5 cm Proximal to 1st MTP Joint 23.3 cm    Across MTP Joint 23.7 cm    Around Proximal Great Toe 8.1 cm                        [facility] Adult PT Treatment/Exercise - 04/22/21 0001       Manual Therapy   Manual Therapy Manual Lymphatic Drainage (MLD)     Manual therapy comments done seperate from all other aspects of treatment    Manual Lymphatic Drainage (MLD) To include supraclavicular, deep and superfical abdominal, Rt inguinal axillary and intrainguinal anastomosis as well as anterior and posterior RT LE ; posterior completed semiprone.    Other Manual Therapy MEASUREMENTS                     PT Education - 04/22/21 1453     Education Details Given HEP as pt has lost her sheet.    Person(s) Educated Patient    Methods Handout              PT Short Term Goals - 04/03/21 1537       PT SHORT TERM GOAL #1   Title PT to be able to vebalize the four aspects to control lymphedema.    Status On-going    Target Date 04/15/21               PT Long Term Goals - 04/03/21 1537       PT LONG TERM GOAL #1   Title PT LE measurements to be decreased 2-4 cm to reduce risk of cellulitis    Time 4    Period Weeks    Status On-going                   Plan - 04/22/21 1455     Clinical Impression Statement PT requests this to be her last treatment for awhile as she would like to work on her walking at the facility that she is living at.   Measurments are actually up but pt has not been able to come consistantly and is in a W/C and has not been completing her LE exercises.  Therapist ordered a compression pump for pt and instructed pt to attempt to use it for an hour everyday.  Pt verbalizes understanding.    Personal Factors and Comorbidities Comorbidity 3+;Fitness    Comorbidities DM, renal disorder, CVA,    Examination-Activity Limitations Locomotion Level    Examination-Participation Restrictions Other    Stability/Clinical Decision Making Evolving/Moderate complexity    Rehab Potential Good    PT Frequency 3x / week  PT Duration 4 weeks    PT Treatment/Interventions Patient/family education;Manual techniques;Manual lymph drainage    PT Next Visit Plan Discharge per pt request.    PT Home Exercise Plan  ankle pumps, LAQ, hip ab/adduction, marching, diaphragmic breath and lymph squeeze all x 10 rep B    Consulted and Agree with Plan of Care Patient             Patient will benefit from skilled therapeutic intervention in order to improve the following deficits and impairments:  Decreased skin integrity, Increased edema  Visit Diagnosis: Lymphedema, not elsewhere classified     Problem List Patient Active Problem List   Diagnosis Date Noted   BV (bacterial vaginosis) 04/02/2020   Vaginal itching 04/02/2020   Vaginal odor 04/02/2020   Vaginal discharge 04/02/2020   Sepsis secondary to UTI ([place]) 01/13/2020   Acute respiratory failure with hypoxia ([place]) 01/13/2020   Right leg swelling 01/13/2020   Sacral decubitus ulcer 01/13/2020   Anemia due to end stage renal disease ([place]) 01/13/2020   Essential hypertension 01/13/2020   Hyperlipidemia 01/13/2020   Type 2 diabetes mellitus ([place]) 01/13/2020   Rayetta Humphrey, PT CLT [phone]  04/22/2021, 3:00 PM  [facility] [address] [place], [place], [address] Phone: [phone]   Fax:  [phone]  Name: Latunya Kissick MRN: [id number] Date of Birth: 07-30-1980

## 2021-04-24 ENCOUNTER — Ambulatory Visit (HOSPITAL_COMMUNITY): Payer: Medicare Other

## 2021-04-28 ENCOUNTER — Encounter (HOSPITAL_COMMUNITY): Payer: Medicare Other | Admitting: Physical Therapy

## 2021-04-29 ENCOUNTER — Encounter (HOSPITAL_COMMUNITY): Payer: Medicare Other

## 2021-04-29 NOTE — Addendum Note (Signed)
Addended by: Leeroy Cha on: 04/29/2021 09:58 AM   Modules accepted: Orders

## 2021-04-29 NOTE — Therapy (Addendum)
[place], [place], [address] Phone: [phone]   Fax:  [phone]  Physical Therapy Evaluation  Patient Details  Name: [name] Barnett MRN: [id number] Date of Birth: 1980-11-03 Referring Provider (PT): Leonides Grills  Visit 1 of 12 Time 9:00 to 9:50  Encounter Date: 04/01/2021    Past Medical History:  Diagnosis Date   Diabetes mellitus without complication ([place])    Hyperlipidemia    Hypertension    Renal disorder    Stroke [facility])    Vitamin D deficiency     Past Surgical History:  Procedure Laterality Date   cardiac stents     CATARACT EXTRACTION W/PHACO Left 08/29/2020   Procedure: CATARACT EXTRACTION PHACO AND INTRAOCULAR LENS PLACEMENT LEFT EYE;  Surgeon: Baruch Goldmann, MD;  Location: [facility];  Service: Ophthalmology;  Laterality: Left;  left CDE=29.30   CATARACT EXTRACTION W/PHACO Right 11/28/2020   Procedure: CATARACT EXTRACTION PHACO AND INTRAOCULAR LENS PLACEMENT (IOC);  Surgeon: Baruch Goldmann, MD;  Location: [facility];  Service: Ophthalmology;  Laterality: Right;  CDE 4.35   COLOSTOMY     TUBAL LIGATION     wound on buttocks      There were no vitals filed for this visit.   Subjective:  Pt has been dx with lymphedema for years.  She had a pump and garments.  She had a CVA  and is currently living in a skilled nursing facility, unfortunately they do not have a lymphedema Therapist.  She has had an exacerbation of her sx and is being referred to skilled outpatient therapy.  Hx:  DM, renal disorder with active dialysis, CVA   Pt is currently wheelchair bound.      04/01/21 0001  What other symptoms do you have  Are you Having Heaviness or Tightness Yes  Are you having Pain No  Are you having pitting edema Yes  Body Site leg  Is it Hard or Difficult finding clothes that fit No  Do you have infections No  Is there Decreased scar mobility No  Stemmer Sign No  Lymphedema Stage   Stage STAGE 2 SPONTANEOUSLY IRREVERSIBLE  Lymphedema Assessments  Lymphedema Assessments LE  Right Lower Extremity Lymphedema  20 cm Proximal to Suprapatella 60.5 cm  10 cm Proximal to Suprapatella 47.5 cm  At Midpatella/Popliteal Crease 39 cm  30 cm Proximal to Floor at Lateral Plantar Foot 35.2 cm  20 cm Proximal to Floor at Lateral Plantar Foot 29.2 1  10  cm Proximal to Floor at Lateral Malleoli 28.2 cm  Circumference of ankle/heel 33 cm.  5 cm Proximal to 1st MTP Joint 23.8 cm  Across MTP Joint 24.7 cm  Around Proximal Great Toe 8.9 cm  Left Lower Extremity Lymphedema  20 cm Proximal to Suprapatella 57 cm  10 cm Proximal to Suprapatella 46.3 cm  At Midpatella/Popliteal Crease 39 cm  30 cm Proximal to Floor at Lateral Plantar Foot 31.8 cm  20 cm Proximal to Floor at Lateral Plantar Foot 22.6 cm  10 cm Proximal to Floor at Lateral Malleoli 22 cm  Circumference of ankle/heel 30.9 cm.  5 cm Proximal to 1st MTP Joint 23.3 cm  Across MTP Joint 23.7 cm  Around Proximal Great Toe 8.1 cm        04/01/21 0001  Exercises  Exercises  (ankle pumps, LAQ, hip ab/adduction, marching, diaphragmic breath and lymph squeeze all x 10 rep B)  Manual Therapy  Manual Therapy Compression Bandaging;Manual Lymphatic Drainage (MLD)  Manual therapy comments done seperate from all other aspects of treatment  Compression Bandaging foam cut for Rt LE          PT Short Term Goals - 04/03/21 1537       PT SHORT TERM GOAL #1   Title PT to be able to vebalize the four aspects to control lymphedema.    Status On-going    Target Date 04/15/21               PT Long Term Goals - 04/03/21 1537       PT LONG TERM GOAL #1   Title PT LE measurements to be decreased 2-4 cm to reduce risk of cellulitis    Time 4    Period Weeks    Status On-going                 Assessment:  [name] Barnett is a 40 yo female who has been dx with lymphedma since she was a teenager. She has had a  recent exacerbation of sx.  She also had a recent CVA in June of 2020 and is currently wheelchair bound living at [facility]. She currently is in renal failure with dialysis three times a week.  The therapist contacted the pt renal MD to get permission to continue with lymphedema treatment which was granted.  At this time [name] Barnett will benefit from skilled PT to decrease her edema and reduce her risk of cellulitis.     Patient will benefit from skilled therapeutic intervention in order to improve the following deficits and impairments:  Decreased skin integrity, Increased edema  Visit Diagnosis: Lymphedema, not elsewhere classified     Problem List Patient Active Problem List   Diagnosis Date Noted   BV (bacterial vaginosis) 04/02/2020   Vaginal itching 04/02/2020   Vaginal odor 04/02/2020   Vaginal discharge 04/02/2020   Sepsis secondary to UTI ([place]) 01/13/2020   Acute respiratory failure with hypoxia ([place]) 01/13/2020   Right leg swelling 01/13/2020   Sacral decubitus ulcer 01/13/2020   Anemia due to end stage renal disease ([place]) 01/13/2020   Essential hypertension 01/13/2020   Hyperlipidemia 01/13/2020   Type 2 diabetes mellitus ([place]) 01/13/2020   Rayetta Humphrey, PT CLT [phone]  04/29/2021, 9:35 AM  [facility] [address] [place], [place], [address] Phone: [phone]   Fax:  [phone]  Name: [name] Barnett MRN: [id number] Date of Birth: 1980-10-19

## 2021-04-30 ENCOUNTER — Encounter (HOSPITAL_COMMUNITY): Payer: Medicare Other

## 2021-05-01 ENCOUNTER — Encounter (HOSPITAL_COMMUNITY): Payer: Medicare Other

## 2021-05-05 ENCOUNTER — Encounter (HOSPITAL_COMMUNITY): Payer: Medicare Other | Admitting: Physical Therapy

## 2021-05-06 ENCOUNTER — Encounter (HOSPITAL_BASED_OUTPATIENT_CLINIC_OR_DEPARTMENT_OTHER): Payer: Medicare Other | Admitting: Internal Medicine

## 2021-05-07 ENCOUNTER — Encounter (HOSPITAL_COMMUNITY): Payer: Medicare Other | Admitting: Physical Therapy

## 2021-05-08 ENCOUNTER — Encounter (HOSPITAL_COMMUNITY): Payer: Medicare Other

## 2021-05-08 ENCOUNTER — Encounter: Payer: Medicare Other | Admitting: Physical Medicine & Rehabilitation

## 2021-05-11 ENCOUNTER — Encounter (HOSPITAL_COMMUNITY): Payer: Medicare Other | Admitting: Physical Therapy

## 2021-05-13 ENCOUNTER — Other Ambulatory Visit: Payer: Self-pay

## 2021-05-13 ENCOUNTER — Encounter (HOSPITAL_BASED_OUTPATIENT_CLINIC_OR_DEPARTMENT_OTHER): Payer: Medicare Other | Attending: Internal Medicine | Admitting: Internal Medicine

## 2021-05-13 ENCOUNTER — Encounter (HOSPITAL_COMMUNITY): Payer: Medicare Other | Admitting: Physical Therapy

## 2021-05-13 DIAGNOSIS — E11622 Type 2 diabetes mellitus with other skin ulcer: Secondary | ICD-10-CM | POA: Insufficient documentation

## 2021-05-13 DIAGNOSIS — L89322 Pressure ulcer of left buttock, stage 2: Secondary | ICD-10-CM | POA: Diagnosis not present

## 2021-05-13 DIAGNOSIS — L89312 Pressure ulcer of right buttock, stage 2: Secondary | ICD-10-CM | POA: Insufficient documentation

## 2021-05-13 DIAGNOSIS — Z992 Dependence on renal dialysis: Secondary | ICD-10-CM | POA: Diagnosis not present

## 2021-05-13 DIAGNOSIS — I132 Hypertensive heart and chronic kidney disease with heart failure and with stage 5 chronic kidney disease, or end stage renal disease: Secondary | ICD-10-CM | POA: Diagnosis not present

## 2021-05-13 DIAGNOSIS — N186 End stage renal disease: Secondary | ICD-10-CM | POA: Insufficient documentation

## 2021-05-13 DIAGNOSIS — G8222 Paraplegia, incomplete: Secondary | ICD-10-CM | POA: Insufficient documentation

## 2021-05-13 DIAGNOSIS — E1122 Type 2 diabetes mellitus with diabetic chronic kidney disease: Secondary | ICD-10-CM | POA: Diagnosis not present

## 2021-05-13 DIAGNOSIS — E1140 Type 2 diabetes mellitus with diabetic neuropathy, unspecified: Secondary | ICD-10-CM | POA: Diagnosis not present

## 2021-05-13 DIAGNOSIS — L89152 Pressure ulcer of sacral region, stage 2: Secondary | ICD-10-CM | POA: Diagnosis not present

## 2021-05-13 DIAGNOSIS — I5032 Chronic diastolic (congestive) heart failure: Secondary | ICD-10-CM | POA: Diagnosis not present

## 2021-05-14 NOTE — Progress Notes (Signed)
[name] Barnett, [name] Barnett ([id number]) Visit Report for 05/13/2021 Debridement Details Patient Name: Date of Service: [name] Barnett, [name] Barnett 05/13/2021 10:30 A M Medical Record Number: [id number] Patient Account Number: [id number] Date of Birth/Sex: Treating RN: 10-May-1980 (41 y.o. [name] Barnett, [name] Barnett Primary Care Provider: SITA FA LWA LLA, SHA RMIN Other Clinician: Referring Provider: Treating Provider/Extender: Linton Ham SITA FA LWA LLA, SHA RMIN Weeks in Treatment: 19 Debridement Performed for Assessment: Wound #6 Perineum Performed By: Physician Ricard Dillon., MD Debridement Type: Debridement Level of Consciousness (Pre-procedure): Awake and Alert Pre-procedure Verification/Time Out Yes - 11:00 Taken: Start Time: 11:00 Pain Control: Lidocaine T Area Debrided (L x W): otal 3 (cm) x 1 (cm) = 3 (cm) Tissue and other material debrided: Viable, Non-Viable, Subcutaneous, Skin: Dermis , Skin: Epidermis Level: Skin/Subcutaneous Tissue Debridement Description: Excisional Instrument: Curette Bleeding: Minimum Hemostasis Achieved: Pressure End Time: 11:00 Procedural Pain: 0 Post Procedural Pain: 0 Response to Treatment: Procedure was tolerated well Level of Consciousness (Post- Awake and Alert procedure): Post Debridement Measurements of Total Wound Length: (cm) 3 Stage: Category/Stage II Width: (cm) 1 Depth: (cm) 0.4 Volume: (cm) 0.942 Character of Wound/Ulcer Post Debridement: Improved Post Procedure Diagnosis Same as Pre-procedure Electronic Signature(s) Signed: 05/13/2021 4:30:17 PM By: Rhae Hammock RN Signed: 05/14/2021 7:40:11 AM By: Linton Ham MD Entered By: Linton Ham on 05/13/2021 12:07:26 -------------------------------------------------------------------------------- HPI Details Patient Name: Date of Service: [name] Barnett, [name] 05/13/2021 10:30 A M Medical Record Number: [id number] Patient Account Number: [id number] Date of Birth/Sex:  Treating RN: 04/24/1981 (41 y.o. [name] Barnett Primary Care Provider: SITA FA LWA LLA, SHA RMIN Other Clinician: Referring Provider: Treating Provider/Extender: Linton Ham SITA FA LWA LLA, SHA RMIN Weeks in Treatment: 19 History of Present Illness HPI Description: Admission 06/15/2019 This is a 41 year old woman who comes to [place] from [facility] skilled facility in [place]. I am able to follow a lot of her history on care everywhere in epic. The patient is a type II diabetic and has been on dialysis. 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 I am not exactly sure of the nature of the stroke at this present time. She has made a decent recovery and is now up walking with a walker. In August 2020 she was admitted to [facility] in [place] with L4-L5 discitis. Culture and sensitivity showed Klebsiella. She was discharged on 8 weeks of ceftazidime. In mid September she is readmitted to the hospital with a pressure ulcer on her lower sacrum and necrotizing fasciitis. She required an extensive surgical debridement. CT scan of the pelvis at 1 point showed communication with the rectum. A colostomy was recommended and placed. Culture of this area showed Klebsiella and yeast. She was discharged on an extended course of meropenem and Diflucan. [facility] [place] We do not have any of these records. I think she was discharged to another nursing home but is come to [facility] skilled facility in [facility]. She was sent down here for review of the remaining wound. By review of the pictures that the patient had from September this is made considerable improvement. She tells me that the wound VAC was discontinued at the end of December 2020. Currently the facility is using a form of silver alginate to the wound surface. Past medical history type 2  diabetes with chronic renal failure on dialysis Tuesday Thursday and Saturday. CVA with left hemiparesis in  June 2020 but she appears to be making a good recovery. Chronic diastolic heart failure, hypertension 2/19; patient's wound area slightly smaller. The area that was towards her anus has closed over. We are using Santyl covered with Hydrofera Blue. We have not been able to get her lab work apparently her veins are very difficult for phlebotomy. They are trying to get the blood work done where she dialyzes in [facility] but that requires some logistical issues. 2/26; not much change in the wound. Still a nonviable surface we have been using Santyl and Hydrofera Blue. I changed her to [facility] only. She tells me she has rigorously offloading this area she also complains of some pain 3/5; still no change here. I switch to pure Hydrofera Blue. She still complains of a lot of pain although is a big wound it seems out of proportion to what I might expect. This was initially a surgical wound secondary to necrotizing wound infection. She is on dialysis. Other than that she thinks she is offloading this fairly rigorously. She has not been systemically unwell 3/26; I switched to Iodoflex 3 weeks ago. This really seems to have helped, much better surface and slight improvement in the surface area. 4/26; the patient is still on Iodoflex she missed an appointment 2 weeks ago. Doing slightly smaller. She is on dialysis 5/10; using Hydrofera Blue. The major improvement here is the complete lack of depth. She has islands of epithelialization 09/24/19-Patient comes in at 2 weeks, dimensions about the same, there is an [place] of epithelium at margin of wound, no slough noted 10/15/2019 upon evaluation today patient appears to be doing well with regard to her wound. She has been tolerating the dressing changes in the sacral region without complication and at this point she seems to be healing quite  nicely. I am extremely pleased with how things are progressing and the epithelial tissue seems to be spreading from the central portion as well which is also excellent. 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 We have been using Hydrofera Blue. The smaller area which has depth at 0.7 cm there is a question about whether they have been packing anything into this or just flaring Hydrofera Blue over the top 9/23; its been more than 2 months since this patient was here. She still has a small area distally which is the remanent of her stage IV wound. They have been using Hydrofera Blue. She also has an area on the left gluteal 10/22; 1 month follow-up. I think the original sacral wound is closed however the patient has 2 areas distally 1 over the coccyx a small pinpoint area and the other into the gluteal cleft. Neither 1 of these looks particularly infected. I thought they might be connected but they were not. 11/15; the original sacral wound is closed patient only has 1 remaining area. 1 of these is in the gluteal cleft just below the coccyx. The area over the coccyx from last time I think is all so closed 12/31/2020; this is a patient that we saw for a pressure ulcer over her lower sacrum and coccyx for a long period of time in 2021. She eventually healed over. She is at [facility] skilled facility largely secondary to a remote CVA she had. She is also end-stage renal disease on dialysis secondary to type 2 diabetes. She tells [place] that since about mid June she has had an area more  distally than the original sacrum/coccyx wound it looks as though they are putting silver alginate in this area. She is limited in her ability to offload this I think spending most of her time in a wheelchair although she can stand to transfer. She has an ostomy During her original stay here in 2021 I also list that she had a more distal area into her perineum that  closed over well before the more proximal coccyx/sacral wound. She had a CT scan of the abdomen and pelvis on 12/01/2020. This did note first chronic sacral decubitus along the right aspect of her distal sacrum and coccyx. There was not felt to be bone destruction she has chronic changes of bilateral sacroiliitis and chronic endplate destruction and sclerosis at L4-L5 secondary to previous osteomyelitis however no new findings were felt to be seen in terms of bone problems in this area 01/14/2021; 2-week follow-up. Her wound is smaller essentially in the coccyx area. We have been using silver collagen. 10/5; 3-week follow-up. The patient returns with her area in the coccyx healed however distally in the perineum there is a fairly long stage II area. I am assuming this patient's wound is a pressure related area. Although this would not be a usual pressure area for a normal situation her immobility secondary to remote CVA, dialysis etc. make her at risk for pressure areas in an otherwise unusual setting. The patient is fairly upset stating that we were told about this last time. I do not have this in my records. Her original wound was small and just about healed at that point and this visit was arranged in follow-up. If there was a new wound more distally into her perineum we did not look at it 11/2; patient's wound is slightly smaller. This is in the gluteal cleft on the left. Using silver alginate. She tells me she is aggressively offloading this area. 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 We have been using silver alginate. Arrives in clinic today with the wound larger. 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 We have been using silver alginate.  PCR culture I did last week was polymicrobial with multiple organisms we put Polysporin under the silver alginate at the facility. According to the patient this got started 05/13/2021; the wound looks slightly smaller. This is in the gluteal cleft on the left. She is using silver alginate with topical antibiotics underneath. The topical antibiotics relate to a deep tissue culture I did some weeks ago. This showed a lawn of low titer bacteria. I am not sure how much she is offloading this area both at dialysis and during the day at the facility would be the other issue Electronic Signature(s) Signed: 05/14/2021 7:40:11 AM By: Linton Ham MD Entered By: Linton Ham on 05/13/2021 12:09:12 -------------------------------------------------------------------------------- Physical Exam Details Patient Name: Date of Service: [name] Barnett, [place] 05/13/2021 10:30 A M Medical Record Number: [id number] Patient Account Number: [id number] Date of Birth/Sex: Treating RN: [birth year] (41 y.o. [name] Barnett Primary Care Provider: SITA FA LWA LLA, SHA RMIN Other Clinician: Referring Provider: Treating Provider/Extender: Linton Ham SITA FA LWA LLA, SHA RMIN Weeks in Treatment: 19 Constitutional Sitting or standing Blood Pressure is within target range for patient.. Pulse regular and within  target range for patient.Marland Kitchen Respirations regular, non-labored and within target range.. Temperature is normal and within the target range for the patient.Marland Kitchen Appears in no distress. Notes Wound exam; the area on the left buttock in the gluteal cleft. This looks as though it is trying to epithelialize so however under illumination a very gritty fibrinous surface over the wound. I used a #3 curette to remove this the area is excessively moist but not obviously infected Electronic Signature(s) Signed: 05/14/2021 7:40:11 AM By: Linton Ham MD Entered By: Linton Ham on 05/13/2021  12:10:11 -------------------------------------------------------------------------------- Physician Orders Details Patient Name: Date of Service: [name] Barnett, [name] Barnett 05/13/2021 10:30 A M Medical Record Number: [id number] Patient Account Number: [id number] Date of Birth/Sex: Treating RN: 1980/08/01 (41 y.o. [name] Barnett Primary Care Provider: SITA FA LWA LLA, SHA RMIN Other Clinician: Referring Provider: Treating Provider/Extender: Linton Ham SITA FA LWA LLA, SHA RMIN Weeks in Treatment: 69 Verbal / Phone Orders: No Diagnosis Coding ICD-10 Coding Code Description L89.322 Pressure ulcer of left buttock, stage 2 G82.22 Paraplegia, incomplete Follow-up Appointments Return appointment in 1 month. - Dr. Dellia Nims Bathing/ Shower/ Hygiene May shower with protection but do not get wound dressing(s) wet. Off-Loading Turn and reposition every 2 hours Non Wound Condition Other Non Wound Condition Orders/Instructions: - Apply nystatin powder and gauze between right and left gluteus Wound Treatment Wound #6 - Perineum Cleanser: Wound Cleanser Every Other Day/30 Days Discharge Instructions: Cleanse the wound with wound cleanser prior to applying a clean dressing using gauze sponges, not tissue or cotton balls. Peri-Wound Care: Skin Prep Every Other Day/30 Days Discharge Instructions: Use skin prep as directed Prim Dressing: [place], 2X2 (in/in) ary Every Other Day/30 Days Secondary Dressing: Zetuvit Plus Silicone Border Dressing 4x4 (in/in) Every Other Day/30 Days Discharge Instructions: Apply silicone border over primary dressing as directed. Electronic Signature(s) Signed: 05/13/2021 4:30:17 PM By: Rhae Hammock RN Signed: 05/14/2021 7:40:11 AM By: Linton Ham MD Entered By: Rhae Hammock on 05/13/2021 11:06:14 -------------------------------------------------------------------------------- Problem List Details Patient Name: Date of  Service: [name] Barnett, [name] Barnett 05/13/2021 10:30 A M Medical Record Number: [id number] Patient Account Number: [id number] Date of Birth/Sex: Treating RN: [birth year] (41 y.o. [name] Primary Care Provider: SITA FA LWA LLA, SHA RMIN Other Clinician: Referring Provider: Treating Provider/Extender: Linton Ham SITA FA LWA LLA, SHA RMIN Weeks in Treatment: 19 Active Problems ICD-10 Encounter Code Description Active Date MDM Diagnosis L89.322 Pressure ulcer of left buttock, stage 2 04/01/2021 No Yes G82.22 Paraplegia, incomplete 03/04/2021 No Yes Inactive Problems ICD-10 Code Description Active Date Inactive Date L89.312 Pressure ulcer of right buttock, stage 2 12/31/2020 12/31/2020 L89.152 Pressure ulcer of sacral region, stage 2 02/04/2021 02/04/2021 Resolved Problems Electronic Signature(s) Signed: 05/14/2021 7:40:11 AM By: Linton Ham MD Entered By: Linton Ham on 05/13/2021 12:06:01 -------------------------------------------------------------------------------- Progress Note Details Patient Name: Date of Service: [name] Barnett, [place] 05/13/2021 10:30 A M Medical Record Number: [id number] Patient Account Number: [id number] Date of Birth/Sex: Treating RN: [birth year] (41 y.o. [name] Barnett Primary Care Provider: SITA FA LWA LLA, SHA RMIN Other Clinician: Referring Provider: Treating Provider/Extender: Linton Ham SITA FA LWA LLA, SHA RMIN Weeks in Treatment: 19 Subjective History of Present Illness (HPI) Admission 06/15/2019 This is a 41 year old woman who comes to [place] from [facility] skilled facility in [facility]. I am able to follow a lot of her history on care everywhere in epic. The patient is a type II diabetic and has been on dialysis. In June 2020 she suffered  a right CVA with left hemiparesis and some degree of language disturbance. I am not exactly sure of the nature of the stroke at this present time. She has made a decent  recovery and is now up walking with a walker. In August 2020 she was admitted to [facility] in [place] with L4-L5 discitis. Culture and sensitivity showed Klebsiella. She was discharged on 8 weeks of ceftazidime. In mid September she is readmitted to the hospital with a pressure ulcer on her lower sacrum and necrotizing fasciitis. She required an extensive surgical debridement. CT scan of the pelvis at 1 point showed communication with the rectum. A colostomy was recommended and placed. Culture of this area showed Klebsiella and yeast. She was discharged on an extended course of meropenem and Diflucan. [facility] [place] We do not have any of these records. I think she was discharged to another nursing home but is come to [facility] skilled facility in [facility]. She was sent down here for review of the remaining wound. By review of the pictures that the patient had from September this is made considerable improvement. She tells me that the wound VAC was discontinued at the end of December 2020. Currently the facility is using a form of silver alginate to the wound surface. Past medical history type 2 diabetes with chronic renal failure on dialysis Tuesday Thursday and Saturday. CVA with left hemiparesis in June 2020 but she appears to be making a good recovery. Chronic diastolic heart failure, hypertension 2/19; patient's wound area slightly smaller. The area that was towards her anus has closed over. We are using Santyl covered with Hydrofera Blue. We have not been able to get her lab work apparently her veins are very difficult for phlebotomy. They are trying to get the blood work done where she dialyzes in [facility] but that requires some logistical issues. 2/26; not much change in the wound. Still a nonviable surface we have been using Santyl and Hydrofera Blue. I changed her to [facility] only. She  tells me she has rigorously offloading this area she also complains of some pain 3/5; still no change here. I switch to pure Hydrofera Blue. She still complains of a lot of pain although is a big wound it seems out of proportion to what I might expect. This was initially a surgical wound secondary to necrotizing wound infection. She is on dialysis. Other than that she thinks she is offloading this fairly rigorously. She has not been systemically unwell 3/26; I switched to Iodoflex 3 weeks ago. This really seems to have helped, much better surface and slight improvement in the surface area. 4/26; the patient is still on Iodoflex she missed an appointment 2 weeks ago. Doing slightly smaller. She is on dialysis 5/10; using Hydrofera Blue. The major improvement here is the complete lack of depth. She has islands of epithelialization 09/24/19-Patient comes in at 2 weeks, dimensions about the same, there is an [place] of epithelium at margin of wound, no slough noted 10/15/2019 upon evaluation today patient appears to be doing well with regard to her wound. She has been tolerating the dressing changes in the sacral region without complication and at this point she seems to be healing quite nicely. I am extremely pleased with how things are progressing and the epithelial tissue seems to be spreading from the central portion as well which is also excellent. 7/12; the patient still has 2 areas of this originally  large stage IV wound in the lower sacrum/coccyx. We have been using Hydrofera Blue. The smaller area which has depth at 0.7 cm there is a question about whether they have been packing anything into this or just flaring Hydrofera Blue over the top 9/23; its been more than 2 months since this patient was here. She still has a small area distally which is the remanent of her stage IV wound. They have been using Hydrofera Blue. She also has an area on the left gluteal 10/22; 1 month follow-up. I think the  original sacral wound is closed however the patient has 2 areas distally 1 over the coccyx a small pinpoint area and the other into the gluteal cleft. Neither 1 of these looks particularly infected. I thought they might be connected but they were not. 11/15; the original sacral wound is closed patient only has 1 remaining area. 1 of these is in the gluteal cleft just below the coccyx. The area over the coccyx from last time I think is all so closed 12/31/2020; this is a patient that we saw for a pressure ulcer over her lower sacrum and coccyx for a long period of time in 2021. She eventually healed over. She is at [facility] skilled facility largely secondary to a remote CVA she had. She is also end-stage renal disease on dialysis secondary to type 2 diabetes. She tells [place] that since about mid June she has had an area more distally than the original sacrum/coccyx wound it looks as though they are putting silver alginate in this area. She is limited in her ability to offload this I think spending most of her time in a wheelchair although she can stand to transfer. She has an ostomy During her original stay here in 2021 I also list that she had a more distal area into her perineum that closed over well before the more proximal coccyx/sacral wound. She had a CT scan of the abdomen and pelvis on 12/01/2020. This did note first chronic sacral decubitus along the right aspect of her distal sacrum and coccyx. There was not felt to be bone destruction she has chronic changes of bilateral sacroiliitis and chronic endplate destruction and sclerosis at L4-L5 secondary to previous osteomyelitis however no new findings were felt to be seen in terms of bone problems in this area 01/14/2021; 2-week follow-up. Her wound is smaller essentially in the coccyx area. We have been using silver collagen. 10/5; 3-week follow-up. The patient returns with her area in the coccyx healed however distally in the perineum there is a  fairly long stage II area. I am assuming this patient's wound is a pressure related area. Although this would not be a usual pressure area for a normal situation her immobility secondary to remote CVA, dialysis etc. make her at risk for pressure areas in an otherwise unusual setting. The patient is fairly upset stating that we were told about this last time. I do not have this in my records. Her original wound was small and just about healed at that point and this visit was arranged in follow-up. If there was a new wound more distally into her perineum we did not look at it 11/2; patient's wound is slightly smaller. This is in the gluteal cleft on the left. Using silver alginate. She tells me she is aggressively offloading this area. In a normal situation this would not be a regular place for a pressure ulcer however she has spinal cord/nerve root damage from L4-L5 discitis 11/30  have not seen this wound in about a month this is on the left buttock within the gluteal cleft. We have been using silver alginate. Arrives in clinic today with the wound larger. 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 We have been using silver alginate. PCR culture I did last week was polymicrobial with multiple organisms we put Polysporin under the silver alginate at the facility. According to the patient this got started 05/13/2021; the wound looks slightly smaller. This is in the gluteal cleft on the left. She is using silver alginate with topical antibiotics underneath. The topical antibiotics relate to a deep tissue culture I did some weeks ago. This showed a lawn of low titer bacteria. I am not sure how much she is offloading this area both at dialysis and during the day at the facility would be the other issue Objective Constitutional Sitting or standing Blood Pressure is within target range for patient.. Pulse regular and within target range for patient.Marland Kitchen  Respirations regular, non-labored and within target range.. Temperature is normal and within the target range for the patient.Marland Kitchen Appears in no distress. Vitals Time Taken: 10:36 AM, Temperature: 98.7 F, Pulse: 74 bpm, Respiratory Rate: 17 breaths/min, Blood Pressure: 124/74 mmHg. General Notes: Wound exam; the area on the left buttock in the gluteal cleft. This looks as though it is trying to epithelialize so however under illumination a very gritty fibrinous surface over the wound. I used a #3 curette to remove this the area is excessively moist but not obviously infected Integumentary (Hair, Skin) Wound #6 status is Open. Original cause of wound was Pressure Injury. The date acquired was: 02/04/2021. The wound has been in treatment 14 weeks. The wound is located on the Perineum. The wound measures 3cm length x 1cm width x 0.4cm depth; 2.356cm^2 area and 0.942cm^3 volume. There is Fat Layer (Subcutaneous Tissue) exposed. There is no tunneling or undermining noted. There is a medium amount of serosanguineous drainage noted. The wound margin is flat and intact. There is large (67-100%) pink granulation within the wound bed. There is a small (1-33%) amount of necrotic tissue within the wound bed including Adherent Slough. Assessment Active Problems ICD-10 Pressure ulcer of left buttock, stage 2 Paraplegia, incomplete Procedures Wound #6 Pre-procedure diagnosis of Wound #6 is a Pressure Ulcer located on the Perineum . There was a Excisional Skin/Subcutaneous Tissue Debridement with a total area of 3 sq cm performed by Ricard Dillon., MD. With the following instrument(s): Curette to remove Viable and Non-Viable tissue/material. Material removed includes Subcutaneous Tissue, Skin: Dermis, and Skin: Epidermis after achieving pain control using Lidocaine. No specimens were taken. A time out was conducted at 11:00, prior to the start of the procedure. A Minimum amount of bleeding was controlled with  Pressure. The procedure was tolerated well with a pain level of 0 throughout and a pain level of 0 following the procedure. Post Debridement Measurements: 3cm length x 1cm width x 0.4cm depth; 0.942cm^3 volume. Post debridement Stage noted as Category/Stage II. Character of Wound/Ulcer Post Debridement is improved. Post procedure Diagnosis Wound #6: Same as Pre-Procedure Plan Follow-up Appointments: Return appointment in 1 month. - Dr. Dellia Nims Bathing/ Shower/ Hygiene: May shower with protection but do not get wound dressing(s) wet. Off-Loading: Turn and reposition every 2 hours Non Wound Condition: Other Non Wound Condition Orders/Instructions: - Apply nystatin powder and gauze between right and left gluteus WOUND #6: - Perineum Wound Laterality: Cleanser: Wound Cleanser Every Other Day/30  Days Discharge Instructions: Cleanse the wound with wound cleanser prior to applying a clean dressing using gauze sponges, not tissue or cotton balls. Peri-Wound Care: Skin Prep Every Other Day/30 Days Discharge Instructions: Use skin prep as directed Prim Dressing: Bend, 2X2 (in/in) Every Other Day/30 Days ary Secondary Dressing: Zetuvit Plus Silicone Border Dressing 4x4 (in/in) Every Other Day/30 Days Discharge Instructions: Apply silicone border over primary dressing as directed. 1. We continued with the topical antibiotic Polysporin/triple antibiotic covered by silver alginate 2. I am not sure how adequately the patient offloads this area however she tells me she is walking at the facility now with a walker 3. There is no obvious evidence of infection Electronic Signature(s) Signed: 05/14/2021 7:40:11 AM By: Linton Ham MD Entered By: Linton Ham on 05/13/2021 12:12:12 -------------------------------------------------------------------------------- SuperBill Details Patient Name: Date of Service: [name] Barnett, [name] Barnett 05/13/2021 Medical Record Number:  [id number] Patient Account Number: [id number] Date of Birth/Sex: Treating RN: 07/12/1980 (41 y.o. [name] Barnett Primary Care Provider: SITA FA LWA LLA, SHA RMIN Other Clinician: Referring Provider: Treating Provider/Extender: Linton Ham SITA FA LWA LLA, SHA RMIN Weeks in Treatment: 19 Diagnosis Coding ICD-10 Codes Code Description Y78.295 Pressure ulcer of left buttock, stage 2 G82.22 Paraplegia, incomplete Facility Procedures CPT4 Code: [id number] Description: 78469 - DEB SUBQ TISSUE 20 SQ CM/< ICD-10 Diagnosis Description L89.322 Pressure ulcer of left buttock, stage 2 Modifier: Quantity: 1 Physician Procedures : CPT4 Code Description Modifier [id number] - WC PHYS SUBQ TISS 20 SQ CM ICD-10 Diagnosis Description L89.322 Pressure ulcer of left buttock, stage 2 Quantity: 1 Electronic Signature(s) Signed: 05/14/2021 7:40:11 AM By: Linton Ham MD Entered By: Linton Ham on 05/13/2021 12:12:26

## 2021-05-14 NOTE — Progress Notes (Signed)
[name] Barnett, [name] Barnett ([id number]) Visit Report for 05/13/2021 Arrival Information Details Patient Name: Date of Service: [name] Barnett, [name] Barnett 05/13/2021 10:30 A M Medical Record Number: [id number] Patient Account Number: [id number] Date of Birth/Sex: Treating RN: 1980/11/03 (41 y.o. [name] Barnett, [name] Barnett Primary Care [name] Barnett: SITA FA LWA LLA, SHA RMIN Other Clinician: Referring [name] Barnett: Treating [name] Barnett/Extender: [name] Barnett SITA FA LWA LLA, SHA RMIN Weeks in Treatment: 19 Visit Information History Since Last Visit Added or deleted any medications: No Patient Arrived: Wheel Chair Any new allergies or adverse reactions: No Arrival Time: 10:35 Had a fall or experienced change in No Accompanied By: self activities of daily living that may affect Transfer Assistance: Manual risk of falls: Patient Identification Verified: Yes Signs or symptoms of abuse/neglect since last visito No Secondary Verification Process Completed: Yes Hospitalized since last visit: No Patient Requires Transmission-Based Precautions: No Implantable device outside of the clinic excluding No Patient Has Alerts: No cellular tissue based products placed in the center since last visit: Has Dressing in Place as Prescribed: Yes Pain Present Now: Yes Electronic Signature(s) Signed: 05/13/2021 4:30:17 PM By: [name] Hammock RN Entered By: [name] Barnett on 05/13/2021 10:36:22 -------------------------------------------------------------------------------- Encounter Discharge Information Details Patient Name: Date of Service: [name] Barnett, [name] 05/13/2021 10:30 A M Medical Record Number: [id number] Patient Account Number: [id number] Date of Birth/Sex: Treating RN: 1980-08-03 (41 y.o. [name] Barnett Primary Care [name] Barnett: SITA FA LWA LLA, SHA RMIN Other Clinician: Referring Amore Ackman: Treating Yamen Castrogiovanni/Extender: [name] Barnett SITA FA LWA LLA, SHA RMIN Weeks in Treatment: 19 Encounter  Discharge Information Items Post Procedure Vitals Discharge Condition: Stable Temperature (F): 98.Barnett Ambulatory Status: Ambulatory Pulse (bpm): 74 Discharge Destination: Home Respiratory Rate (breaths/min): 17 Transportation: Private Auto Blood Pressure (mmHg): 134/74 Accompanied By: self Schedule Follow-up Appointment: Yes Clinical Summary of Care: Patient Declined Electronic Signature(s) Signed: 05/13/2021 4:30:17 PM By: [name] Hammock RN Entered By: [name] Barnett on 05/13/2021 11:07:42 -------------------------------------------------------------------------------- Lower Extremity Assessment Details Patient Name: Date of Service: [name] Barnett, [name] Barnett 05/13/2021 10:30 A M Medical Record Number: [id number] Patient Account Number: [id number] Date of Birth/Sex: Treating RN: April 06, 1981 (41 y.o. [name] Barnett Primary Care Everly Rubalcava: SITA FA [name] Barnett, SHA RMIN Other Clinician: Referring Deaundre Allston: Treating Alayshia Marini/Extender: [name] Barnett SITA FA LWA LLA, SHA RMIN Weeks in Treatment: 19 Electronic Signature(s) Signed: 05/13/2021 4:30:17 PM By: [name] Hammock RN Entered By: [name] Barnett on 05/13/2021 10:37:06 -------------------------------------------------------------------------------- Multi Wound Chart Details Patient Name: Date of Service: [name] Barnett, [name] Barnett 05/13/2021 10:30 A M Medical Record Number: [id number] Patient Account Number: [id number] Date of Birth/Sex: Treating RN: [birth year] (41 y.o. [name] Barnett Primary Care Anjeli Casad: SITA FA LWA LLA, SHA RMIN Other Clinician: Referring Laurann Mcmorris: Treating Payten Hobin/Extender: [name] Barnett SITA FA LWA LLA, SHA RMIN Weeks in Treatment: 19 Vital Signs Height(in): Pulse(bpm): 50 Weight(lbs): Blood Pressure(mmHg): 124/74 Body Mass Index(BMI): Temperature(F): 98.Barnett Respiratory Rate(breaths/min): 17 Photos: [N/A:N/A] Perineum N/A N/A Wound Location: Pressure Injury N/A N/A Wounding  Event: Pressure Ulcer N/A N/A Primary Etiology: Cataracts, Glaucoma, Anemia, N/A N/A Comorbid History: Congestive Heart Failure, Hypertension, Type II Diabetes, End Stage Renal Disease, Neuropathy, Confinement Anxiety 02/04/2021 N/A N/A Date Acquired: 14 N/A N/A Weeks of Treatment: Open N/A N/A Wound Status: 3x1x0.4 N/A N/A Measurements L x W x D (cm) 2.356 N/A N/A A (cm) : rea 0.942 N/A N/A Volume (cm) : -33.90% N/A N/A % Reduction in A rea: -167.60% N/A N/A % Reduction in Volume: Category/Stage II N/A N/A Classification: Medium N/A N/A Exudate A mount: Serosanguineous N/A N/A Exudate Type:  red, brown N/A N/A Exudate Color: Flat and Intact N/A N/A Wound Margin: Large (67-100%) N/A N/A Granulation A mount: Pink N/A N/A Granulation Quality: Small (1-33%) N/A N/A Necrotic Amount: Fat Layer (Subcutaneous Tissue): Yes N/A N/A Exposed Structures: Fascia: No Tendon: No Muscle: No Joint: No Bone: No Small (1-33%) N/A N/A Epithelialization: Debridement - Excisional N/A N/A Debridement: Pre-procedure Verification/Time Out 11:00 N/A N/A Taken: Lidocaine N/A N/A Pain Control: Subcutaneous N/A N/A Tissue Debrided: Skin/Subcutaneous Tissue N/A N/A Level: 3 N/A N/A Debridement A (sq cm): rea Curette N/A N/A Instrument: Minimum N/A N/A Bleeding: Pressure N/A N/A Hemostasis A chieved: 0 N/A N/A Procedural Pain: 0 N/A N/A Post Procedural Pain: Procedure was tolerated well N/A N/A Debridement Treatment Response: 3x1x0.4 N/A N/A Post Debridement Measurements L x W x D (cm) 0.942 N/A N/A Post Debridement Volume: (cm) Category/Stage II N/A N/A Post Debridement Stage: Debridement N/A N/A Procedures Performed: Treatment Notes Electronic Signature(s) Signed: 05/13/2021 4:30:17 PM By: [name] Hammock RN Signed: 05/14/2021 Barnett:40:11 AM By: [name] Ham MD Entered By: [name] Barnett on 05/13/2021  12:06:10 -------------------------------------------------------------------------------- Multi-Disciplinary Care Plan Details Patient Name: Date of Service: [name] Barnett, [name] Barnett 05/13/2021 10:30 A M Medical Record Number: [id number] Patient Account Number: [id number] Date of Birth/Sex: Treating RN: Sep 05, 1980 (41 y.o. [name] Primary Care Valon Glasscock: SITA FA LWA LLA, SHA RMIN Other Clinician: Referring Muranda Coye: Treating Gibson Lad/Extender: [name] Barnett SITA FA LWA LLA, SHA RMIN Weeks in Treatment: 19 Active Inactive Wound/Skin Impairment Nursing Diagnoses: Impaired tissue integrity Knowledge deficit related to ulceration/compromised skin integrity Goals: Patient will have a decrease in wound volume by X% from date: (specify in notes) Date Initiated: 12/31/2020 Date Inactivated: 02/04/2021 Target Resolution Date: 9/Barnett/2022 Goal Status: Met Patient/caregiver will verbalize understanding of skin care regimen Date Initiated: 12/31/2020 Target Resolution Date: 06/12/2021 Goal Status: Active Ulcer/skin breakdown will have a volume reduction of 30% by week 4 Date Initiated: 12/31/2020 Date Inactivated: 01/14/2021 Target Resolution Date: 01/10/2021 Goal Status: Met Ulcer/skin breakdown will have a volume reduction of 50% by week 8 Date Initiated: 12/31/2020 Date Inactivated: 01/14/2021 Target Resolution Date: 01/23/2021 Goal Status: Met Ulcer/skin breakdown will have a volume reduction of 80% by week 12 Date Initiated: 12/31/2020 Date Inactivated: 02/04/2021 Target Resolution Date: 02/18/2021 Goal Status: Met Interventions: Assess patient/caregiver ability to obtain necessary supplies Assess patient/caregiver ability to perform ulcer/skin care regimen upon admission and as needed Assess ulceration(s) every visit Notes: Electronic Signature(s) Signed: 05/13/2021 5:58:46 PM By: Levan Hurst RN, BSN Entered By: Levan Hurst on 05/13/2021  10:53:21 -------------------------------------------------------------------------------- Pain Assessment Details Patient Name: Date of Service: [name] Barnett, [name] Barnett 05/13/2021 10:30 A M Medical Record Number: [id number] Patient Account Number: [id number] Date of Birth/Sex: Treating RN: 01-30-1981 (41 y.o. [name] Barnett Primary Care Roxie Kreeger: SITA FA LWA LLA, SHA RMIN Other Clinician: Referring Shaune Malacara: Treating Vidit Boissonneault/Extender: [name] Barnett SITA FA LWA LLA, SHA RMIN Weeks in Treatment: 19 Active Problems Location of Pain Severity and Description of Pain Patient Has Paino Yes Site Locations Rate the pain. Current Pain Level: Barnett Worst Pain Level: 10 Least Pain Level: 0 Tolerable Pain Level: Barnett Pain Management and Medication Current Pain Management: Medication: No Cold Application: No Rest: No Massage: No Activity: No T.E.N.S.: No Heat Application: No Leg drop or elevation: No Is the Current Pain Management Adequate: Adequate How does your wound impact your activities of daily livingo Sleep: No Bathing: No Appetite: No Relationship With Others: No Bladder Continence: No Emotions: No Bowel Continence: No Work: No Toileting: No Drive: No Dressing: No  Hobbies: No Electronic Signature(s) Signed: 05/13/2021 4:30:17 PM By: [name] Hammock RN Entered By: [name] Barnett on 05/13/2021 10:36:59 -------------------------------------------------------------------------------- Patient/Caregiver Education Details Patient Name: Date of Service: [name] Barnett, [name] Barnett 1/11/2023andnbsp10:30 [place] Record Number: [id number] Patient Account Number: [id number] Date of Birth/Gender: Treating RN: 23-Feb-1981 (41 y.o. [name] Primary Care Physician: SITA FA LWA LLA, SHA RMIN Other Clinician: Referring Physician: Treating Physician/Extender: [name] Barnett SITA FA LWA LLA, SHA RMIN Weeks in Treatment: 19 Education Assessment Education Provided  To: Patient Education Topics Provided Wound/Skin Impairment: Methods: Explain/Verbal Responses: State content correctly Electronic Signature(s) Signed: 05/13/2021 5:58:46 PM By: Levan Hurst RN, BSN Entered By: Levan Hurst on 05/13/2021 10:53:32 -------------------------------------------------------------------------------- Wound Assessment Details Patient Name: Date of Service: [name] Barnett, [name] Barnett 05/13/2021 10:30 A M Medical Record Number: [id number] Patient Account Number: [id number] Date of Birth/Sex: Treating RN: Oct 24, 1980 (41 y.o. [name] Barnett, [name] Barnett Primary Care Gracelee Stemmler: SITA FA LWA LLA, SHA RMIN Other Clinician: Referring Cairo Agostinelli: Treating Ebony Rickel/Extender: [name] Barnett SITA FA LWA LLA, SHA RMIN Weeks in Treatment: 19 Wound Status Wound Number: 6 Primary Pressure Ulcer Etiology: Wound Location: Perineum Wound Open Wounding Event: Pressure Injury Status: Date Acquired: 02/04/2021 Comorbid Cataracts, Glaucoma, Anemia, Congestive Heart Failure, Weeks Of Treatment: 14 History: Hypertension, Type II Diabetes, End Stage Renal Disease, Clustered Wound: No Neuropathy, Confinement Anxiety Photos Wound Measurements Length: (cm) 3 Width: (cm) 1 Depth: (cm) 0.4 Area: (cm) 2.356 Volume: (cm) 0.942 % Reduction in Area: -33.9% % Reduction in Volume: -167.6% Epithelialization: Small (1-33%) Tunneling: No Undermining: No Wound Description Classification: Category/Stage II Wound Margin: Flat and Intact Exudate Amount: Medium Exudate Type: Serosanguineous Exudate Color: red, brown Foul Odor After Cleansing: No Slough/Fibrino No Wound Bed Granulation Amount: Large (67-100%) Exposed Structure Granulation Quality: Pink Fascia Exposed: No Necrotic Amount: Small (1-33%) Fat Layer (Subcutaneous Tissue) Exposed: Yes Necrotic Quality: Adherent Slough Tendon Exposed: No Muscle Exposed: No Joint Exposed: No Bone Exposed: No Electronic Signature(s) Signed:  05/13/2021 4:30:17 PM By: [name] Hammock RN Entered By: [name] Barnett on 05/13/2021 10:50:07 -------------------------------------------------------------------------------- Vitals Details Patient Name: Date of Service: [name] Barnett, [place] 05/13/2021 10:30 A M Medical Record Number: [id number] Patient Account Number: [id number] Date of Birth/Sex: Treating RN: 1980-07-01 (41 y.o. [name] Barnett, [name] Barnett Primary Care Dellene Mcgroarty: SITA FA LWA LLA, SHA RMIN Other Clinician: Referring Tracker Mance: Treating Beecher Furio/Extender: [name] Barnett SITA FA LWA LLA, SHA RMIN Weeks in Treatment: 19 Vital Signs Time Taken: 10:36 Temperature (F): 98.Barnett Pulse (bpm): 74 Respiratory Rate (breaths/min): 17 Blood Pressure (mmHg): 124/74 Reference Range: 80 - 120 mg / dl Electronic Signature(s) Signed: 05/13/2021 4:30:17 PM By: [name] Hammock RN Entered By: [name] Barnett on 05/13/2021 10:36:41

## 2021-05-15 ENCOUNTER — Encounter (HOSPITAL_COMMUNITY): Payer: Medicare Other | Admitting: Physical Therapy

## 2021-05-20 NOTE — Telephone Encounter (Signed)
No show called pt unable to contact pt.   Rayetta Humphrey, [place] CLT [phone]

## 2021-05-22 ENCOUNTER — Other Ambulatory Visit: Payer: Self-pay

## 2021-05-22 ENCOUNTER — Ambulatory Visit (INDEPENDENT_AMBULATORY_CARE_PROVIDER_SITE_OTHER): Payer: Medicare Other | Admitting: Specialist

## 2021-05-22 ENCOUNTER — Encounter: Payer: Self-pay | Admitting: Specialist

## 2021-05-22 VITALS — BP 130/88 | HR 92 | Ht 65.0 in | Wt 187.0 lb

## 2021-05-22 DIAGNOSIS — R600 Localized edema: Secondary | ICD-10-CM

## 2021-05-22 DIAGNOSIS — M545 Low back pain, unspecified: Secondary | ICD-10-CM

## 2021-05-22 DIAGNOSIS — I69354 Hemiplegia and hemiparesis following cerebral infarction affecting left non-dominant side: Secondary | ICD-10-CM

## 2021-05-22 DIAGNOSIS — G8929 Other chronic pain: Secondary | ICD-10-CM

## 2021-05-22 DIAGNOSIS — E1142 Type 2 diabetes mellitus with diabetic polyneuropathy: Secondary | ICD-10-CM

## 2021-05-22 DIAGNOSIS — R29898 Other symptoms and signs involving the musculoskeletal system: Secondary | ICD-10-CM

## 2021-05-22 DIAGNOSIS — R609 Edema, unspecified: Secondary | ICD-10-CM

## 2021-05-22 DIAGNOSIS — M4649 Discitis, unspecified, multiple sites in spine: Secondary | ICD-10-CM

## 2021-05-22 DIAGNOSIS — M5136 Other intervertebral disc degeneration, lumbar region: Secondary | ICD-10-CM | POA: Diagnosis not present

## 2021-05-22 DIAGNOSIS — I872 Venous insufficiency (chronic) (peripheral): Secondary | ICD-10-CM

## 2021-05-22 DIAGNOSIS — M21372 Foot drop, left foot: Secondary | ICD-10-CM | POA: Diagnosis not present

## 2021-05-22 DIAGNOSIS — M51369 Other intervertebral disc degeneration, lumbar region without mention of lumbar back pain or lower extremity pain: Secondary | ICD-10-CM

## 2021-05-22 NOTE — Progress Notes (Signed)
Office Visit Note   Patient: [name] Barnett           Date of Birth: [birth year]           MRN: [id number] Visit Date: 05/22/2021              Requested by: Julianne Handler, DO [address] [place],  [place] [address] PCP: Julianne Handler, DO   Assessment & Plan: Visit Diagnoses:  1. Degenerative disc disease, lumbar   2. Discitis of multiple sites of spine   3. Hemiparesis affecting left side as late effect of cerebrovascular accident ([place])   4. Foot drop, left   5. Hemiparesis affecting left side as late effect of cerebrovascular accident (CVA) ([place])   6. Leg weakness, bilateral   7. DM type 2 with diabetic peripheral neuropathy ([place])   8. Chronic low back pain, unspecified back pain laterality, unspecified whether sciatica present   9. Edema of right lower leg due to venous stasis   10. Edema, unspecified type     Plan: Plan: Lumbar discitis is stable and there is no suggestion of acute worsening of her chronic discitis osteomyelitis changes at the L4-5 level.  I recommend that she be evaluated by the Rehabilitation specialists at [facility], Dr. Anselm Lis for assessment of her CVA and hemiparesis and determination if further rehabilitation may be able to improve her recovery of  Independence and consideration of return to independent living circumstance.  Return in 6 weeks for follow up.  Compression stocking right leg. Continue with PT for gait training and transitioning.   Follow-Up Instructions: Return in about 4 weeks (around 06/19/2021).   Orders:  No orders of the defined types were placed in this encounter.  No orders of the defined types were placed in this encounter.     Procedures: No procedures performed   Clinical Data: No additional findings.   Subjective: Chief Complaint  Patient presents with   Lower Back - Follow-up    States that she is doing great, she has rec'd her upright walker and she is using it independently.     41  year old female with history of discitis and osteomyelitis. She is participating in PT and OT at the SNF where she is located in [place], [place]. 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    Review of Systems  Constitutional: Negative.   HENT: Negative.    Eyes: Negative.   Respiratory: Negative.    Cardiovascular: Negative.   Gastrointestinal: Negative.   Endocrine: Negative.   Genitourinary: Negative.   Musculoskeletal: Negative.   Skin: Negative.   Allergic/Immunologic: Negative.   Neurological: Negative.   Hematological: Negative.   Psychiatric/Behavioral: Negative.      Objective: Vital Signs: BP 130/88 (BP Location: Left Arm, Patient Position: Sitting)    Pulse 92    Ht 5\' 5"  (1.651 m)    Wt 187 lb (84.8 kg)    BMI 31.12 kg/m   Physical Exam Constitutional:      Appearance: She is well-developed.  HENT:     Head: Normocephalic and atraumatic.  Eyes:     Pupils: Pupils are equal, round, and reactive to light.  Pulmonary:     Effort: Pulmonary effort is normal.     Breath sounds: Normal breath sounds.  Abdominal:     General: Bowel sounds are normal.     Palpations: Abdomen is soft.  Musculoskeletal:  General: Normal range of motion.     Cervical back: Normal range of motion and neck supple.  Skin:    General: Skin is warm and dry.  Neurological:     Mental Status: She is alert and oriented to person, place, and time.  Psychiatric:        Behavior: Behavior normal.        Thought Content: Thought content normal.        Judgment: Judgment normal.    Back Exam   Tenderness  The patient is experiencing tenderness in the lumbar.  Muscle Strength  The patient has normal back strength. Right Quadriceps:  4/5  Left Quadriceps:  4/5  Right Hamstrings:  4/5  Left Hamstrings:  4/5   Reflexes  Patellar:  0/4 Achilles:  0/4  Comments:  Right leg swelling 2.25 inches greater circumference more than left at mid  calf     Specialty Comments:  No specialty comments available.  Imaging: No results found.   PMFS History: Patient Active Problem List   Diagnosis Date Noted   BV (bacterial vaginosis) 04/02/2020   Vaginal itching 04/02/2020   Vaginal odor 04/02/2020   Vaginal discharge 04/02/2020   Sepsis secondary to UTI ([place]) 01/13/2020   Acute respiratory failure with hypoxia ([place]) 01/13/2020   Right leg swelling 01/13/2020   Sacral decubitus ulcer 01/13/2020   Anemia due to end stage renal disease ([place]) 01/13/2020   Essential hypertension 01/13/2020   Hyperlipidemia 01/13/2020   Type 2 diabetes mellitus ([place]) 01/13/2020   Past Medical History:  Diagnosis Date   Diabetes mellitus without complication ([place])    Hyperlipidemia    Hypertension    Renal disorder    Stroke ([place])    Vitamin D deficiency     Family History  Problem Relation Age of Onset   Cancer Paternal Grandmother    Heart disease Father    Hypertension Father    Diabetes Father    Glaucoma Father    Heart disease Mother    Hypertension Mother    Diabetes Mother    Diabetes Sister        borderline   Hypertension Sister     Past Surgical History:  Procedure Laterality Date   cardiac stents     CATARACT EXTRACTION W/PHACO Left 08/29/2020   Procedure: CATARACT EXTRACTION PHACO AND INTRAOCULAR LENS PLACEMENT LEFT EYE;  Surgeon: Baruch Goldmann, MD;  Location: [facility];  Service: Ophthalmology;  Laterality: Left;  left CDE=29.30   CATARACT EXTRACTION W/PHACO Right 11/28/2020   Procedure: CATARACT EXTRACTION PHACO AND INTRAOCULAR LENS PLACEMENT (I[facility]);  Surgeon: Baruch Goldmann, MD;  Location: [facility];  Service: Ophthalmology;  Laterality: Right;  CDE 4.35   COLOSTOMY     TUBAL LIGATION     wound on buttocks     Social History   Occupational History   Not on file  Tobacco Use   Smoking status: Never   Smokeless tobacco: Never  Vaping Use   Vaping Use: Never used  Substance and Sexual Activity   Alcohol use:  Never   Drug use: Never   Sexual activity: Not Currently    Birth control/protection: Surgical    Comment: tubal

## 2021-05-22 NOTE — Patient Instructions (Signed)
Plan: Lumbar discitis is stable and there is no suggestion of acute worsening of her chronic discitis osteomyelitis changes at the L4-5 level.  I recommend that she be evaluated by the Rehabilitation specialists at [facility], Dr. Anselm Lis for assessment of her CVA and hemiparesis and determination if further rehabilitation may be able to improve her recovery of  Independence and consideration of return to independent living circumstance.  Return in 3 months for follow up.  Compression stocking right leg. Continue with PT for gait training and transitioning.

## 2021-06-05 ENCOUNTER — Encounter (HOSPITAL_COMMUNITY): Payer: Self-pay | Admitting: Physical Therapy

## 2021-06-10 ENCOUNTER — Other Ambulatory Visit: Payer: Self-pay

## 2021-06-10 ENCOUNTER — Encounter (HOSPITAL_BASED_OUTPATIENT_CLINIC_OR_DEPARTMENT_OTHER): Payer: Medicare Other | Attending: Internal Medicine | Admitting: Internal Medicine

## 2021-06-10 DIAGNOSIS — I69352 Hemiplegia and hemiparesis following cerebral infarction affecting left dominant side: Secondary | ICD-10-CM | POA: Insufficient documentation

## 2021-06-10 DIAGNOSIS — I5032 Chronic diastolic (congestive) heart failure: Secondary | ICD-10-CM | POA: Diagnosis not present

## 2021-06-10 DIAGNOSIS — Z992 Dependence on renal dialysis: Secondary | ICD-10-CM | POA: Insufficient documentation

## 2021-06-10 DIAGNOSIS — G8222 Paraplegia, incomplete: Secondary | ICD-10-CM | POA: Diagnosis present

## 2021-06-10 DIAGNOSIS — N186 End stage renal disease: Secondary | ICD-10-CM | POA: Diagnosis not present

## 2021-06-10 DIAGNOSIS — I132 Hypertensive heart and chronic kidney disease with heart failure and with stage 5 chronic kidney disease, or end stage renal disease: Secondary | ICD-10-CM | POA: Diagnosis not present

## 2021-06-10 DIAGNOSIS — L89152 Pressure ulcer of sacral region, stage 2: Secondary | ICD-10-CM | POA: Insufficient documentation

## 2021-06-10 DIAGNOSIS — E1122 Type 2 diabetes mellitus with diabetic chronic kidney disease: Secondary | ICD-10-CM | POA: Insufficient documentation

## 2021-06-10 DIAGNOSIS — L89322 Pressure ulcer of left buttock, stage 2: Secondary | ICD-10-CM | POA: Insufficient documentation

## 2021-06-10 NOTE — Progress Notes (Signed)
[name] Barnett, [name] Barnett ([id number]) Visit Report for 06/10/2021 Arrival Information Details Patient Name: Date of Service: [name] Barnett, [name] Barnett 06/10/2021 12:30 PM Medical Record Number: [id number] Patient Account Number: [id number] Date of Birth/Sex: Treating RN: 1980/10/28 (42 y.o. [name] Primary Care Marguita Venning: SITA FA LWA LLA, SHA RMIN Other Clinician: Referring Andras Grunewald: Treating Collen Hostler/Extender: Linton Ham SITA FA LWA LLA, SHA RMIN Weeks in Treatment: 23 Visit Information History Since Last Visit Added or deleted any medications: No Patient Arrived: Wheel Chair Any new allergies or adverse reactions: No Arrival Time: 12:49 Had a fall or experienced change in No Accompanied By: alone activities of daily living that may affect Transfer Assistance: None risk of falls: Patient Identification Verified: Yes Signs or symptoms of abuse/neglect since last visito No Secondary Verification Process Completed: Yes Hospitalized since last visit: No Patient Requires Transmission-Based Precautions: No Implantable device outside of the clinic excluding No Patient Has Alerts: No cellular tissue based products placed in the center since last visit: Has Dressing in Place as Prescribed: Yes Pain Present Now: Yes Electronic Signature(s) Signed: 06/10/2021 6:23:21 PM By: Levan Hurst RN, BSN Entered By: Levan Hurst on 06/10/2021 12:49:59 -------------------------------------------------------------------------------- Clinic Level of Care Assessment Details Patient Name: Date of Service: [name] Barnett, [name] Barnett 06/10/2021 12:30 PM Medical Record Number: [id number] Patient Account Number: [id number] Date of Birth/Sex: Treating RN: 07-25-1980 (41 y.o. [name] Primary Care Tayt Moyers: SITA FA LWA LLA, SHA RMIN Other Clinician: Referring Ellanie Oppedisano: Treating Amani Marseille/Extender: Linton Ham SITA FA LWA LLA, SHA RMIN Weeks in Treatment: 23 Clinic Level of Care Assessment  Items TOOL 4 Quantity Score X- 1 0 Use when only an EandM is performed on FOLLOW-UP visit ASSESSMENTS - Nursing Assessment / Reassessment X- 1 10 Reassessment of Co-morbidities (includes updates in patient status) X- 1 5 Reassessment of Adherence to Treatment Plan ASSESSMENTS - Wound and Skin A ssessment / Reassessment X - Simple Wound Assessment / Reassessment - one wound 1 5 []  - 0 Complex Wound Assessment / Reassessment - multiple wounds []  - 0 Dermatologic / Skin Assessment (not related to wound area) ASSESSMENTS - Focused Assessment []  - 0 Circumferential Edema Measurements - multi extremities []  - 0 Nutritional Assessment / Counseling / Intervention []  - 0 Lower Extremity Assessment (monofilament, tuning fork, pulses) []  - 0 Peripheral Arterial Disease Assessment (using hand held doppler) ASSESSMENTS - Ostomy and/or Continence Assessment and Care []  - 0 Incontinence Assessment and Management []  - 0 Ostomy Care Assessment and Management (repouching, etc.) PROCESS - Coordination of Care X - Simple Patient / Family Education for ongoing care 1 15 []  - 0 Complex (extensive) Patient / Family Education for ongoing care X- 1 10 Staff obtains Programmer, systems, Records, T Results / Process Orders est X- 1 10 Staff telephones HHA, Nursing Homes / Clarify orders / etc []  - 0 Routine Transfer to another Facility (non-emergent condition) []  - 0 Routine Hospital Admission (non-emergent condition) []  - 0 New Admissions / Biomedical engineer / Ordering NPWT Apligraf, etc. , []  - 0 Emergency Hospital Admission (emergent condition) X- 1 10 Simple Discharge Coordination []  - 0 Complex (extensive) Discharge Coordination PROCESS - Special Needs []  - 0 Pediatric / Minor Patient Management []  - 0 Isolation Patient Management []  - 0 Hearing / Language / Visual special needs []  - 0 Assessment of Community assistance (transportation, D/C planning, etc.) []  - 0 Additional  assistance / Altered mentation []  - 0 Support Surface(s) Assessment (bed, cushion, seat, etc.) INTERVENTIONS - Wound Cleansing / Measurement X -  Simple Wound Cleansing - one wound 1 5 []  - 0 Complex Wound Cleansing - multiple wounds X- 1 5 Wound Imaging (photographs - any number of wounds) []  - 0 Wound Tracing (instead of photographs) X- 1 5 Simple Wound Measurement - one wound []  - 0 Complex Wound Measurement - multiple wounds INTERVENTIONS - Wound Dressings X - Small Wound Dressing one or multiple wounds 1 10 []  - 0 Medium Wound Dressing one or multiple wounds []  - 0 Large Wound Dressing one or multiple wounds []  - 0 Application of Medications - topical []  - 0 Application of Medications - injection INTERVENTIONS - Miscellaneous []  - 0 External ear exam []  - 0 Specimen Collection (cultures, biopsies, blood, body fluids, etc.) []  - 0 Specimen(s) / Culture(s) sent or taken to Lab for analysis []  - 0 Patient Transfer (multiple staff / Civil Service fast streamer / Similar devices) []  - 0 Simple Staple / Suture removal (25 or less) []  - 0 Complex Staple / Suture removal (26 or more) []  - 0 Hypo / Hyperglycemic Management (close monitor of Blood Glucose) []  - 0 Ankle / Brachial Index (ABI) - do not check if billed separately X- 1 5 Vital Signs Has the patient been seen at the hospital within the last three years: Yes Total Score: 95 Level Of Care: New/Established - Level 3 Electronic Signature(s) Signed: 06/10/2021 6:23:21 PM By: Levan Hurst RN, BSN Entered By: Levan Hurst on 06/10/2021 17:50:24 -------------------------------------------------------------------------------- Multi Wound Chart Details Patient Name: Date of Service: [name] Barnett, [name] Barnett 06/10/2021 12:30 PM Medical Record Number: [id number] Patient Account Number: [id number] Date of Birth/Sex: Treating RN: January 20, 1981 (41 y.o. [name] Primary Care Arafat Cocuzza: SITA FA LWA LLA, SHA RMIN Other  Clinician: Referring Jeris Easterly: Treating Monico Sudduth/Extender: Linton Ham SITA FA LWA LLA, SHA RMIN Weeks in Treatment: 23 Vital Signs Height(in): Pulse(bpm): 84 Weight(lbs): Blood Pressure(mmHg): 145/88 Body Mass Index(BMI): Temperature(F): 98.4 Respiratory Rate(breaths/min): 18 Photos: [N/A:N/A] Perineum N/A N/A Wound Location: Pressure Injury N/A N/A Wounding Event: Pressure Ulcer N/A N/A Primary Etiology: Cataracts, Glaucoma, Anemia, N/A N/A Comorbid History: Congestive Heart Failure, Hypertension, Type II Diabetes, End Stage Renal Disease, Neuropathy, Confinement Anxiety 02/04/2021 N/A N/A Date Acquired: 38 N/A N/A Weeks of Treatment: Open N/A N/A Wound Status: No N/A N/A Wound Recurrence: 2x1x0.3 N/A N/A Measurements L x W x D (cm) 1.571 N/A N/A A (cm) : rea 0.471 N/A N/A Volume (cm) : 10.70% N/A N/A % Reduction in A rea: -33.80% N/A N/A % Reduction in Volume: Category/Stage II N/A N/A Classification: Medium N/A N/A Exudate A mount: Serosanguineous N/A N/A Exudate Type: red, brown N/A N/A Exudate Color: Flat and Intact N/A N/A Wound Margin: Large (67-100%) N/A N/A Granulation A mount: Pink N/A N/A Granulation Quality: Small (1-33%) N/A N/A Necrotic A mount: Fat Layer (Subcutaneous Tissue): Yes N/A N/A Exposed Structures: Fascia: No Tendon: No Muscle: No Joint: No Bone: No Small (1-33%) N/A N/A Epithelialization: Treatment Notes Electronic Signature(s) Signed: 06/10/2021 4:37:10 PM By: Linton Ham MD Signed: 06/10/2021 6:23:21 PM By: Levan Hurst RN, BSN Entered By: Linton Ham on 06/10/2021 13:08:39 -------------------------------------------------------------------------------- Multi-Disciplinary Care Plan Details Patient Name: Date of Service: [name] Barnett, [name] Barnett 06/10/2021 12:30 PM Medical Record Number: [id number] Patient Account Number: [id number] Date of Birth/Sex: Treating RN: 04-06-1981 (41 y.o. [name] Primary Care Abigail Teall: SITA FA LWA LLA, SHA RMIN Other Clinician: Referring Evelyne Makepeace: Treating Tahirih Lair/Extender: Linton Ham SITA FA LWA LLA, SHA RMIN Weeks in Treatment: 23 Active Inactive Wound/Skin Impairment Nursing Diagnoses: Impaired tissue integrity  Knowledge deficit related to ulceration/compromised skin integrity Goals: Patient will have a decrease in wound volume by X% from date: (specify in notes) Date Initiated: 12/31/2020 Date Inactivated: 02/04/2021 Target Resolution Date: 01/07/2021 Goal Status: Met Patient/caregiver will verbalize understanding of skin care regimen Date Initiated: 12/31/2020 Target Resolution Date: 07/10/2021 Goal Status: Active Ulcer/skin breakdown will have a volume reduction of 30% by week 4 Date Initiated: 12/31/2020 Date Inactivated: 01/14/2021 Target Resolution Date: 01/10/2021 Goal Status: Met Ulcer/skin breakdown will have a volume reduction of 50% by week 8 Date Initiated: 12/31/2020 Date Inactivated: 01/14/2021 Target Resolution Date: 01/23/2021 Goal Status: Met Ulcer/skin breakdown will have a volume reduction of 80% by week 12 Date Initiated: 12/31/2020 Date Inactivated: 02/04/2021 Target Resolution Date: 02/18/2021 Goal Status: Met Interventions: Assess patient/caregiver ability to obtain necessary supplies Assess patient/caregiver ability to perform ulcer/skin care regimen upon admission and as needed Assess ulceration(s) every visit Notes: Electronic Signature(s) Signed: 06/10/2021 6:23:21 PM By: Levan Hurst RN, BSN Entered By: Levan Hurst on 06/10/2021 13:16:36 -------------------------------------------------------------------------------- Pain Assessment Details Patient Name: Date of Service: [name] Barnett, [name] Barnett 06/10/2021 12:30 PM Medical Record Number: [id number] Patient Account Number: [id number] Date of Birth/Sex: Treating RN: [birth year] (41 y.o. [name] Primary Care Reighan Hipolito: SITA FA LWA LLA,  SHA RMIN Other Clinician: Referring Dalon Reichart: Treating Tanara Turvey/Extender: Linton Ham SITA FA LWA LLA, SHA RMIN Weeks in Treatment: 23 Active Problems Location of Pain Severity and Description of Pain Patient Has Paino Yes Site Locations Pain Location: Pain in Ulcers With Dressing Change: Yes Duration of the Pain. Constant / Intermittento Intermittent Rate the pain. Current Pain Level: 6 Character of Pain Describe the Pain: Throbbing Pain Management and Medication Current Pain Management: Medication: Yes Cold Application: No Rest: No Massage: No Activity: No T.E.N.S.: No Heat Application: No Leg drop or elevation: No Is the Current Pain Management Adequate: Adequate How does your wound impact your activities of daily livingo Sleep: No Bathing: No Appetite: No Relationship With Others: No Bladder Continence: No Emotions: No Bowel Continence: No Work: No Toileting: No Drive: No Dressing: No Hobbies: No Engineer, maintenance) Signed: 06/10/2021 6:23:21 PM By: Levan Hurst RN, BSN Entered By: Levan Hurst on 06/10/2021 12:50:32 -------------------------------------------------------------------------------- Patient/Caregiver Education Details Patient Name: Date of Service: [name] Barnett, [name] Barnett 2/8/2023andnbsp12:30 PM Medical Record Number: [id number] Patient Account Number: [id number] Date of Birth/Gender: Treating RN: 03-14-1981 (41 y.o. [name] Primary Care Physician: SITA FA LWA LLA, SHA RMIN Other Clinician: Referring Physician: Treating Physician/Extender: Linton Ham SITA FA LWA LLA, SHA RMIN Weeks in Treatment: 23 Education Assessment Education Provided To: Patient Education Topics Provided Wound/Skin Impairment: Methods: Explain/Verbal Responses: State content correctly Electronic Signature(s) Signed: 06/10/2021 6:23:21 PM By: Levan Hurst RN, BSN Entered By: Levan Hurst on 06/10/2021  17:49:55 -------------------------------------------------------------------------------- Wound Assessment Details Patient Name: Date of Service: [name] Barnett, [name] Barnett 06/10/2021 12:30 PM Medical Record Number: [id number] Patient Account Number: [id number] Date of Birth/Sex: Treating RN: 11/17/1980 (41 y.o. [name] Primary Care Asheton Viramontes: SITA FA LWA LLA, SHA RMIN Other Clinician: Referring Bisma Klett: Treating Jager Koska/Extender: Linton Ham SITA FA LWA LLA, SHA RMIN Weeks in Treatment: 23 Wound Status Wound Number: 6 Primary Pressure Ulcer Etiology: Wound Location: Perineum Wound Open Wounding Event: Pressure Injury Status: Date Acquired: 02/04/2021 Comorbid Cataracts, Glaucoma, Anemia, Congestive Heart Failure, Weeks Of Treatment: 18 History: Hypertension, Type II Diabetes, End Stage Renal Disease, Clustered Wound: No Neuropathy, Confinement Anxiety Photos Wound Measurements Length: (cm) 2 Width: (cm) 1 Depth: (cm) 0.3 Area: (cm) 1.571 Volume: (cm) 0.471 %  Reduction in Area: 10.7% % Reduction in Volume: -33.8% Epithelialization: Small (1-33%) Tunneling: No Undermining: No Wound Description Classification: Category/Stage II Wound Margin: Flat and Intact Exudate Amount: Medium Exudate Type: Serosanguineous Exudate Color: red, brown Foul Odor After Cleansing: No Slough/Fibrino Yes Wound Bed Granulation Amount: Large (67-100%) Exposed Structure Granulation Quality: Pink Fascia Exposed: No Necrotic Amount: Small (1-33%) Fat Layer (Subcutaneous Tissue) Exposed: Yes Necrotic Quality: Adherent Slough Tendon Exposed: No Muscle Exposed: No Joint Exposed: No Bone Exposed: No Electronic Signature(s) Signed: 06/10/2021 6:23:21 PM By: Levan Hurst RN, BSN Entered By: Levan Hurst on 06/10/2021 12:57:38 -------------------------------------------------------------------------------- [place] Details Patient Name: Date of Service: [name] DA Stefano Gaul, [place]  06/10/2021 12:30 PM Medical Record Number: [id number] Patient Account Number: [id number] Date of Birth/Sex: Treating RN: 1981-01-14 (41 y.o. [name] Primary Care Malashia Kamaka: SITA FA LWA LLA, SHA RMIN Other Clinician: Referring Toi Stelly: Treating Carsin Randazzo/Extender: Linton Ham SITA FA LWA LLA, SHA RMIN Weeks in Treatment: 23 Vital Signs Time Taken: 12:49 Temperature (F): 98.4 Pulse (bpm): 84 Respiratory Rate (breaths/min): 18 Blood Pressure (mmHg): 145/88 Reference Range: 80 - 120 mg / dl Electronic Signature(s) Signed: 06/10/2021 6:23:21 PM By: Levan Hurst RN, BSN Entered By: Levan Hurst on 06/10/2021 12:50:13

## 2021-06-11 NOTE — Progress Notes (Signed)
[name] LEIAH, GIANNOTTI ([id number]) Visit Report for 06/10/2021 HPI Details Patient Name: Date of Service: [name] Barnett, [name] Barnett 06/10/2021 12:30 PM Medical Record Number: [id number] Patient Account Number: [id number] Date of Birth/Sex: Treating RN: April 22, 1981 (41 y.o. [name] Primary Care Provider: SITA FA LWA LLA, SHA RMIN Other Clinician: Referring Provider: Treating Provider/Extender: Linton Ham SITA FA LWA LLA, SHA RMIN Weeks in Treatment: 23 History of Present Illness HPI Description: Admission 06/15/2019 This is a 41 year old woman who comes to [place] from [facility] skilled facility in [place]. I am able to follow a lot of her history on care everywhere in epic. The patient is a type II diabetic and has been on dialysis. 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 I am not exactly sure of the nature of the stroke at this present time. She has made a decent recovery and is now up walking with a walker. In August 2020 she was admitted to [facility] in [place] with L4-L5 discitis. Culture and sensitivity showed Klebsiella. She was discharged on 8 weeks of ceftazidime. In mid September she is readmitted to the hospital with a pressure ulcer on her lower sacrum and necrotizing fasciitis. She required an extensive surgical debridement. CT scan of the pelvis at 1 point showed communication with the rectum. A colostomy was recommended and placed. Culture of this area showed Klebsiella and yeast. She was discharged on an extended course of meropenem and Diflucan. [facility] [place] We do not have any of these records. I think she was discharged to another nursing home but is come to [facility] skilled facility in [facility]. She was sent down here for review of the remaining wound. By review of the pictures that the patient had from  September this is made considerable improvement. She tells me that the wound VAC was discontinued at the end of December 2020. Currently the facility is using a form of silver alginate to the wound surface. Past medical history type 2 diabetes with chronic renal failure on dialysis Tuesday Thursday and Saturday. CVA with left hemiparesis in June 2020 but she appears to be making a good recovery. Chronic diastolic heart failure, hypertension 2/19; patient's wound area slightly smaller. The area that was towards her anus has closed over. We are using Santyl covered with Hydrofera Blue. We have not been able to get her lab work apparently her veins are very difficult for phlebotomy. They are trying to get the blood work done where she dialyzes in [facility] but that requires some logistical issues. 2/26; not much change in the wound. Still a nonviable surface we have been using Santyl and Hydrofera Blue. I changed her to [facility] only. She tells me she has rigorously offloading this area she also complains of some pain 3/5; still no change here. I switch to pure Hydrofera Blue. She still complains of a lot of pain although is a big wound it seems out of proportion to what I might expect. This was initially a surgical wound secondary to necrotizing wound infection. She is on dialysis. Other than that she thinks she is offloading this fairly rigorously. She has not been systemically unwell 3/26; I switched to Iodoflex 3 weeks ago. This really seems to have helped, much better surface and slight improvement in the surface area. 4/26; the patient is still on Iodoflex she missed an appointment 2 weeks ago.  Doing slightly smaller. She is on dialysis 5/10; using Hydrofera Blue. The major improvement here is the complete lack of depth. She has islands of epithelialization 09/24/19-Patient comes in at 2 weeks, dimensions about the same, there is an [place] of epithelium at margin of wound, no slough  noted 10/15/2019 upon evaluation today patient appears to be doing well with regard to her wound. She has been tolerating the dressing changes in the sacral region without complication and at this point she seems to be healing quite nicely. I am extremely pleased with how things are progressing and the epithelial tissue seems to be spreading from the central portion as well which is also excellent. 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 We have been using Hydrofera Blue. The smaller area which has depth at 0.7 cm there is a question about whether they have been packing anything into this or just flaring Hydrofera Blue over the top 9/23; its been more than 2 months since this patient was here. She still has a small area distally which is the remanent of her stage IV wound. They have been using Hydrofera Blue. She also has an area on the left gluteal 10/22; 1 month follow-up. I think the original sacral wound is closed however the patient has 2 areas distally 1 over the coccyx a small pinpoint area and the other into the gluteal cleft. Neither 1 of these looks particularly infected. I thought they might be connected but they were not. 11/15; the original sacral wound is closed patient only has 1 remaining area. 1 of these is in the gluteal cleft just below the coccyx. The area over the coccyx from last time I think is all so closed 12/31/2020; this is a patient that we saw for a pressure ulcer over her lower sacrum and coccyx for a long period of time in 2021. She eventually healed over. She is at [facility] skilled facility largely secondary to a remote CVA she had. She is also end-stage renal disease on dialysis secondary to type 2 diabetes. She tells [place] that since about mid June she has had an area more distally than the original sacrum/coccyx wound it looks as though they are putting silver alginate in this area. She is limited in her  ability to offload this I think spending most of her time in a wheelchair although she can stand to transfer. She has an ostomy During her original stay here in 2021 I also list that she had a more distal area into her perineum that closed over well before the more proximal coccyx/sacral wound. She had a CT scan of the abdomen and pelvis on 12/01/2020. This did note first chronic sacral decubitus along the right aspect of her distal sacrum and coccyx. There was not felt to be bone destruction she has chronic changes of bilateral sacroiliitis and chronic endplate destruction and sclerosis at L4-L5 secondary to previous osteomyelitis however no new findings were felt to be seen in terms of bone problems in this area 01/14/2021; 2-week follow-up. Her wound is smaller essentially in the coccyx area. We have been using silver collagen. 10/5; 3-week follow-up. The patient returns with her area in the coccyx healed however distally in the perineum there is a fairly long stage II area. I am assuming this patient's wound is a pressure related area. Although this would not be a usual pressure area for a normal situation her immobility secondary to remote CVA,  dialysis etc. make her at risk for pressure areas in an otherwise unusual setting. The patient is fairly upset stating that we were told about this last time. I do not have this in my records. Her original wound was small and just about healed at that point and this visit was arranged in follow-up. If there was a new wound more distally into her perineum we did not look at it 11/2; patient's wound is slightly smaller. This is in the gluteal cleft on the left. Using silver alginate. She tells me she is aggressively offloading this area. 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 We have been using silver alginate. Arrives in clinic today with the wound larger. 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 We have been using silver alginate. PCR culture I did last week was polymicrobial with multiple organisms we put Polysporin under the silver alginate at the facility. According to the patient this got started 05/13/2021; the wound looks slightly smaller. This is in the gluteal cleft on the left. She is using silver alginate with topical antibiotics underneath. The topical antibiotics relate to a deep tissue culture I did some weeks ago. This showed a lawn of low titer bacteria. I am not sure how much she is offloading this area both at dialysis and during the day at the facility would be the other issue 2/8; continued nice improvement the wound is smaller. This is on the gluteal cleft on the left. She is using topical antibiotics/triple antibiotic with silver alginate. Electronic Signature(s) Signed: 06/10/2021 4:37:10 PM By: Linton Ham MD Entered By: Linton Ham on 06/10/2021 13:10:01 -------------------------------------------------------------------------------- Physical Exam Details Patient Name: Date of Service: [name] Barnett, [name] Barnett 06/10/2021 12:30 PM Medical Record Number: [id number] Patient Account Number: [id number] Date of Birth/Sex: Treating RN: Nov 01, 1980 (41 y.o. [name] Primary Care Provider: SITA FA LWA LLA, SHA RMIN Other Clinician: Referring Provider: Treating Provider/Extender: Linton Ham SITA FA LWA LLA, SHA RMIN Weeks in Treatment: 23 Constitutional Sitting or standing Blood Pressure is within target range for patient.. Pulse regular and within target range for patient.Marland Kitchen Respirations regular, non-labored and within target range.. Temperature is normal and within the target range for the patient.Marland Kitchen Appears in no distress. Notes Wound exam; the area on the  left buttock is in the gluteal cleft. Much improved. There is a rim of nice epithelialization. The area still looks somewhat moist which is not surprising. However there is no evidence of surrounding infection. Electronic Signature(s) Signed: 06/10/2021 4:37:10 PM By: Linton Ham MD Entered By: Linton Ham on 06/10/2021 13:11:33 -------------------------------------------------------------------------------- Physician Orders Details Patient Name: Date of Service: [name] Barnett, [name] Barnett 06/10/2021 12:30 PM Medical Record Number: [id number] Patient Account Number: [id number] Date of Birth/Sex: Treating RN: Feb 05, 1981 (41 y.o. [name] Primary Care Provider: SITA FA LWA LLA, SHA RMIN Other Clinician: Referring Provider: Treating Provider/Extender: Linton Ham SITA FA LWA LLA, SHA RMIN Weeks in Treatment: 23 Verbal / Phone Orders: No Diagnosis Coding ICD-10 Coding Code Description L89.322 Pressure ulcer of left buttock, stage 2 G82.22 Paraplegia, incomplete Follow-up Appointments Return appointment in 1 month. - Dr. Dellia Nims Bathing/ Shower/ Hygiene May shower with protection but do not get wound dressing(s) wet. Off-Loading Turn and reposition every 2 hours  Non Wound Condition Other Non Wound Condition Orders/Instructions: - Apply nystatin powder and gauze between right and left gluteus Wound Treatment Wound #6 - Perineum Cleanser: Wound Cleanser 1 x Per Day/30 Days Discharge Instructions: Cleanse the wound with wound cleanser prior to applying a clean dressing using gauze sponges, not tissue or cotton balls. Peri-Wound Care: Skin Prep 1 x Per Day/30 Days Discharge Instructions: Use skin prep as directed Topical: Triple Antibiotic Ointment, 1 (oz) Tube 1 x Per Day/30 Days Discharge Instructions: Apply directly to wound bed, under aquacel Prim Dressing: AquacelAg Advantage Dressing, 2X2 (in/in) 1 x Per Day/30 Days ary Discharge Instructions: Apply Aquacel to wound  bed Secondary Dressing: Zetuvit Plus Silicone Border Dressing 4x4 (in/in) 1 x Per Day/30 Days Discharge Instructions: Apply silicone border over primary dressing as directed. Electronic Signature(s) Signed: 06/10/2021 4:37:10 PM By: Linton Ham MD Signed: 06/10/2021 6:23:21 PM By: Levan Hurst RN, BSN Entered By: Levan Hurst on 06/10/2021 13:05:45 -------------------------------------------------------------------------------- Problem List Details Patient Name: Date of Service: [name] Barnett, [name] Barnett 06/10/2021 12:30 PM Medical Record Number: [id number] Patient Account Number: [id number] Date of Birth/Sex: Treating RN: Sep 21, 1980 (41 y.o. [name] Primary Care Provider: SITA FA LWA LLA, SHA RMIN Other Clinician: Referring Provider: Treating Provider/Extender: Linton Ham SITA FA LWA LLA, SHA RMIN Weeks in Treatment: 23 Active Problems ICD-10 Encounter Code Description Active Date MDM Diagnosis L89.322 Pressure ulcer of left buttock, stage 2 04/01/2021 No Yes G82.22 Paraplegia, incomplete 03/04/2021 No Yes Inactive Problems ICD-10 Code Description Active Date Inactive Date L89.312 Pressure ulcer of right buttock, stage 2 12/31/2020 12/31/2020 L89.152 Pressure ulcer of sacral region, stage 2 02/04/2021 02/04/2021 Resolved Problems Electronic Signature(s) Signed: 06/10/2021 4:37:10 PM By: Linton Ham MD Entered By: Linton Ham on 06/10/2021 13:08:29 -------------------------------------------------------------------------------- Progress Note Details Patient Name: Date of Service: [name] Barnett, [name] Barnett 06/10/2021 12:30 PM Medical Record Number: [id number] Patient Account Number: [id number] Date of Birth/Sex: Treating RN: [birth year] (41 y.o. [name] Primary Care Provider: SITA FA LWA LLA, SHA RMIN Other Clinician: Referring Provider: Treating Provider/Extender: Linton Ham SITA FA LWA LLA, SHA RMIN Weeks in Treatment: 23 Subjective History of  Present Illness (HPI) Admission 06/15/2019 This is a 41 year old woman who comes to [place] from [facility] skilled facility in [facility]. I am able to follow a lot of her history on care everywhere in epic. The patient is a type II diabetic and has been on dialysis. 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 I am not exactly sure of the nature of the stroke at this present time. She has made a decent recovery and is now up walking with a walker. In August 2020 she was admitted to [facility] in [place] with L4-L5 discitis. Culture and sensitivity showed Klebsiella. She was discharged on 8 weeks of ceftazidime. In mid September she is readmitted to the hospital with a pressure ulcer on her lower sacrum and necrotizing fasciitis. She required an extensive surgical debridement. CT scan of the pelvis at 1 point showed communication with the rectum. A colostomy was recommended and placed. Culture of this area showed Klebsiella and yeast. She was discharged on an extended course of meropenem and Diflucan. [facility] [place] We do not have any of these records. I think she was discharged to another nursing home but is come to [facility] skilled facility in [facility]. She was sent down here for review of  the remaining wound. By review of the pictures that the patient had from September this is made considerable improvement. She tells me that the wound VAC was discontinued at the end of December 2020. Currently the facility is using a form of silver alginate to the wound surface. Past medical history type 2 diabetes with chronic renal failure on dialysis Tuesday Thursday and Saturday. CVA with left hemiparesis in June 2020 but she appears to be making a good recovery. Chronic diastolic heart failure, hypertension 2/19; patient's wound area slightly smaller. The area  that was towards her anus has closed over. We are using Santyl covered with Hydrofera Blue. We have not been able to get her lab work apparently her veins are very difficult for phlebotomy. They are trying to get the blood work done where she dialyzes in [facility] but that requires some logistical issues. 2/26; not much change in the wound. Still a nonviable surface we have been using Santyl and Hydrofera Blue. I changed her to [facility] only. She tells me she has rigorously offloading this area she also complains of some pain 3/5; still no change here. I switch to pure Hydrofera Blue. She still complains of a lot of pain although is a big wound it seems out of proportion to what I might expect. This was initially a surgical wound secondary to necrotizing wound infection. She is on dialysis. Other than that she thinks she is offloading this fairly rigorously. She has not been systemically unwell 3/26; I switched to Iodoflex 3 weeks ago. This really seems to have helped, much better surface and slight improvement in the surface area. 4/26; the patient is still on Iodoflex she missed an appointment 2 weeks ago. Doing slightly smaller. She is on dialysis 5/10; using Hydrofera Blue. The major improvement here is the complete lack of depth. She has islands of epithelialization 09/24/19-Patient comes in at 2 weeks, dimensions about the same, there is an [place] of epithelium at margin of wound, no slough noted 10/15/2019 upon evaluation today patient appears to be doing well with regard to her wound. She has been tolerating the dressing changes in the sacral region without complication and at this point she seems to be healing quite nicely. I am extremely pleased with how things are progressing and the epithelial tissue seems to be spreading from the central portion as well which is also excellent. 7/12; the patient still has 2 areas of this originally large stage IV wound in the lower  sacrum/coccyx. We have been using Hydrofera Blue. The smaller area which has depth at 0.7 cm there is a question about whether they have been packing anything into this or just flaring Hydrofera Blue over the top 9/23; its been more than 2 months since this patient was here. She still has a small area distally which is the remanent of her stage IV wound. They have been using Hydrofera Blue. She also has an area on the left gluteal 10/22; 1 month follow-up. I think the original sacral wound is closed however the patient has 2 areas distally 1 over the coccyx a small pinpoint area and the other into the gluteal cleft. Neither 1 of these looks particularly infected. I thought they might be connected but they were not. 11/15; the original sacral wound is closed patient only has 1 remaining area. 1 of these is in the gluteal cleft just below the coccyx. The area over the coccyx from last time I think is all so closed 12/31/2020; this  is a patient that we saw for a pressure ulcer over her lower sacrum and coccyx for a long period of time in 2021. She eventually healed over. She is at [facility] skilled facility largely secondary to a remote CVA she had. She is also end-stage renal disease on dialysis secondary to type 2 diabetes. She tells [place] that since about mid June she has had an area more distally than the original sacrum/coccyx wound it looks as though they are putting silver alginate in this area. She is limited in her ability to offload this I think spending most of her time in a wheelchair although she can stand to transfer. She has an ostomy During her original stay here in 2021 I also list that she had a more distal area into her perineum that closed over well before the more proximal coccyx/sacral wound. She had a CT scan of the abdomen and pelvis on 12/01/2020. This did note first chronic sacral decubitus along the right aspect of her distal sacrum and coccyx. There was not felt to be bone  destruction she has chronic changes of bilateral sacroiliitis and chronic endplate destruction and sclerosis at L4-L5 secondary to previous osteomyelitis however no new findings were felt to be seen in terms of bone problems in this area 01/14/2021; 2-week follow-up. Her wound is smaller essentially in the coccyx area. We have been using silver collagen. 10/5; 3-week follow-up. The patient returns with her area in the coccyx healed however distally in the perineum there is a fairly long stage II area. I am assuming this patient's wound is a pressure related area. Although this would not be a usual pressure area for a normal situation her immobility secondary to remote CVA, dialysis etc. make her at risk for pressure areas in an otherwise unusual setting. The patient is fairly upset stating that we were told about this last time. I do not have this in my records. Her original wound was small and just about healed at that point and this visit was arranged in follow-up. If there was a new wound more distally into her perineum we did not look at it 11/2; patient's wound is slightly smaller. This is in the gluteal cleft on the left. Using silver alginate. She tells me she is aggressively offloading this area. 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 We have been using silver alginate. Arrives in clinic today with the wound larger. 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 We have been using silver alginate. PCR culture I did last week was polymicrobial with multiple organisms we put Polysporin under the silver alginate at the facility. According to the patient this got started 05/13/2021; the wound looks slightly smaller. This is in the gluteal cleft on  the left. She is using silver alginate with topical antibiotics underneath. The topical antibiotics relate to a deep tissue culture I did some weeks ago. This showed a lawn of low titer bacteria. I am not sure how much she is offloading this area both at dialysis and during the day at the facility would be the other issue 2/8; continued nice improvement the wound is smaller. This is on the gluteal cleft on the left. She is using topical antibiotics/triple antibiotic with silver alginate.  Objective Constitutional Sitting or standing Blood Pressure is within target range for patient.. Pulse regular and within target range for patient.Marland Kitchen Respirations regular, non-labored and within target range.. Temperature is normal and within the target range for the patient.Marland Kitchen Appears in no distress. Vitals Time Taken: 12:49 PM, Temperature: 98.4 F, Pulse: 84 bpm, Respiratory Rate: 18 breaths/min, Blood Pressure: 145/88 mmHg. General Notes: Wound exam; the area on the left buttock is in the gluteal cleft. Much improved. There is a rim of nice epithelialization. The area still looks somewhat moist which is not surprising. However there is no evidence of surrounding infection. Integumentary (Hair, Skin) Wound #6 status is Open. Original cause of wound was Pressure Injury. The date acquired was: 02/04/2021. The wound has been in treatment 18 weeks. The wound is located on the Perineum. The wound measures 2cm length x 1cm width x 0.3cm depth; 1.571cm^2 area and 0.471cm^3 volume. There is Fat Layer (Subcutaneous Tissue) exposed. There is no tunneling or undermining noted. There is a medium amount of serosanguineous drainage noted. The wound margin is flat and intact. There is large (67-100%) pink granulation within the wound bed. There is a small (1-33%) amount of necrotic tissue within the wound bed including Adherent Slough. Assessment Active Problems ICD-10 Pressure ulcer of left buttock, stage 2 Paraplegia,  incomplete Plan Follow-up Appointments: Return appointment in 1 month. - Dr. Dellia Nims Bathing/ Shower/ Hygiene: May shower with protection but do not get wound dressing(s) wet. Off-Loading: Turn and reposition every 2 hours Non Wound Condition: Other Non Wound Condition Orders/Instructions: - Apply nystatin powder and gauze between right and left gluteus WOUND #6: - Perineum Wound Laterality: Cleanser: Wound Cleanser 1 x Per Day/30 Days Discharge Instructions: Cleanse the wound with wound cleanser prior to applying a clean dressing using gauze sponges, not tissue or cotton balls. Peri-Wound Care: Skin Prep 1 x Per Day/30 Days Discharge Instructions: Use skin prep as directed Topical: Triple Antibiotic Ointment, 1 (oz) Tube 1 x Per Day/30 Days Discharge Instructions: Apply directly to wound bed, under aquacel Prim Dressing: AquacelAg Advantage Dressing, 2X2 (in/in) 1 x Per Day/30 Days ary Discharge Instructions: Apply Aquacel to wound bed Secondary Dressing: Zetuvit Plus Silicone Border Dressing 4x4 (in/in) 1 x Per Day/30 Days Discharge Instructions: Apply silicone border over primary dressing as directed. 1. I did not change the primary dressing nor the topical antibiotic 2. It looks as though she is doing very well 3. This would not be a usual spot for a pressure ulcer in a normal case however this patient is a paraplegic. Her life is a course complicated by having to go to dialysis 3 times a week with the result and pressure in the recliner. She tells me she is up walking with a walker however Electronic Signature(s) Signed: 06/10/2021 4:37:10 PM By: Linton Ham MD Entered By: Linton Ham on 06/10/2021 13:12:35 -------------------------------------------------------------------------------- SuperBill Details Patient Name: Date of Service: [name] Barnett, [name] Barnett 06/10/2021 Medical Record Number: [id number] Patient Account Number: [id number] Date of Birth/Sex: Treating  RN: 1981/01/25 (41 y.o. [name] Primary Care Provider: SITA FA LWA LLA, SHA RMIN Other Clinician: Referring Provider: Treating Provider/Extender: Linton Ham SITA FA LWA LLA, SHA RMIN Weeks in Treatment: 23 Diagnosis Coding ICD-10 Codes Code Description L89.322 Pressure ulcer of left buttock, stage 2 G82.22 Paraplegia, incomplete Facility Procedures CPT4 Code: [id number] Description: 99213 - WOUND CARE VISIT-LEV 3 EST PT Modifier: Quantity: 1 Physician Procedures : CPT4 Code Description Modifier [id number] - WC PHYS LEVEL 3 - EST PT  ICD-10 Diagnosis Description L89.322 Pressure ulcer of left buttock, stage 2 G82.22 Paraplegia, incomplete Quantity: 1 Electronic Signature(s) Signed: 06/10/2021 6:23:21 PM By: Levan Hurst RN, BSN Signed: 06/11/2021 5:29:10 PM By: Linton Ham MD Previous Signature: 06/10/2021 4:37:10 PM Version By: Linton Ham MD Entered By: Levan Hurst on 06/10/2021 17:50:30

## 2021-06-19 ENCOUNTER — Encounter: Payer: Self-pay | Admitting: Physical Medicine & Rehabilitation

## 2021-06-19 ENCOUNTER — Other Ambulatory Visit: Payer: Self-pay

## 2021-06-19 ENCOUNTER — Encounter: Payer: Medicare Other | Attending: Physical Medicine & Rehabilitation | Admitting: Physical Medicine & Rehabilitation

## 2021-06-19 VITALS — BP 117/82 | HR 86 | Ht 65.0 in

## 2021-06-19 DIAGNOSIS — I890 Lymphedema, not elsewhere classified: Secondary | ICD-10-CM | POA: Insufficient documentation

## 2021-06-19 DIAGNOSIS — R5381 Other malaise: Secondary | ICD-10-CM | POA: Insufficient documentation

## 2021-06-19 DIAGNOSIS — Z8673 Personal history of transient ischemic attack (TIA), and cerebral infarction without residual deficits: Secondary | ICD-10-CM | POA: Insufficient documentation

## 2021-06-19 NOTE — Patient Instructions (Signed)
Will send note to Rayetta Humphrey PT who is your Lymphedema therapist

## 2021-06-19 NOTE — Progress Notes (Signed)
Subjective:    Patient ID: [name] Barnett, female    DOB: 1980-12-14, 41 y.o.   MRN: [id number] Review of records from [facility] multiple admissions HPI  Pt is 41 yo with hx of corpus callosum CVA in 10/2018, severe diabetic neuropathy and Discitis who currently resides in SNF and referred by Ortho to see what additional rehabilitative efforts are needed to upgrade patient's functional status.  The patient has been in a skilled nursing facility for about 2 years.  She plans to leave by the end of March.  According to patient report functional status is as follows: Dresses with Mod I [place] Mod I with shower  Pt can ambulate 40' using a walker, pt states that she does not need physical assist.  Transfers in and out of bed Pt states she can remember to take meds  HD Tu,TH, Sa  The patient is originally from [place] currently is in [place] at the skilled nursing facility and plans to move to [place].  She has church family members to assist but no blood relatives in this area  Patient also has been treated for lymphedema in the right lower extremity by physical therapy she has had a sacral wound following numerous hospitalizations in 2020 and 2021 this is now essentially healed.  Prior to going to [facility]  Pain Inventory Average Pain 8 Pain Right Now 8 My pain is intermittent, sharp, burning, and aching  LOCATION OF PAIN  back, legs  BOWEL Number of stools per week: 7 or more Oral laxative use No  Type of laxative Miralax Enema or suppository use No  History of colostomy Yes  Incontinent No   BLADDER Normal and Dialysis    Mobility walk with assistance use a walker how many minutes can you walk? 10-15 minutes ability to climb steps?  yes do you drive?  no use a wheelchair transfers alone Do you have any goals in this area?   yes  Function disabled: date disabled 2019 Do you have any goals in this area?  yes  Neuro/Psych weakness numbness tremor tingling trouble walking spasms  Prior Studies Any changes since last visit?  no  Physicians involved in your care Any changes since last visit?  no   Family History  Problem Relation Age of Onset   Cancer Paternal Grandmother    Heart disease Father    Hypertension Father    Diabetes Father    Glaucoma Father    Heart disease Mother    Hypertension Mother    Diabetes Mother    Diabetes Sister        borderline   Hypertension Sister    Social History   Socioeconomic History   Marital status: Legally Separated    Spouse name: Not on file   Number of children: Not on file   Years of education: Not on file   Highest education level: Not on file  Occupational History   Not on file  Tobacco Use   Smoking status: Never   Smokeless tobacco: Never  Vaping Use   Vaping Use: Never used  Substance and Sexual Activity   Alcohol use: Never   Drug use: Never   Sexual activity: Not Currently    Birth control/protection: Surgical    Comment: tubal  Other Topics Concern   Not on file  Social History Narrative   Not on file   Social Determinants  of Health   Financial Resource Strain: Not on file  Food Insecurity: Not on file  Transportation Needs: Not on file  Physical Activity: Not on file  Stress: Not on file  Social Connections: Not on file   Past Surgical History:  Procedure Laterality Date   cardiac stents     CATARACT EXTRACTION W/PHACO Left 08/29/2020   Procedure: CATARACT EXTRACTION PHACO AND INTRAOCULAR LENS PLACEMENT LEFT EYE;  Surgeon: Baruch Goldmann, MD;  Location: [facility];  Service: Ophthalmology;  Laterality: Left;  left CDE=29.30   CATARACT EXTRACTION W/PHACO Right 11/28/2020   Procedure: CATARACT EXTRACTION PHACO AND INTRAOCULAR LENS PLACEMENT (IOC);  Surgeon: Baruch Goldmann, MD;  Location: [facility];  Service: Ophthalmology;   Laterality: Right;  CDE 4.35   COLOSTOMY     TUBAL LIGATION     wound on buttocks     Past Medical History:  Diagnosis Date   Diabetes mellitus without complication ([place])    Hyperlipidemia    Hypertension    Renal disorder    Stroke [facility])    Vitamin D deficiency    There were no vitals taken for this visit.  Opioid Risk Score:   Fall Risk Score:  `1  Depression screen PHQ 2/9  Depression screen PHQ 2/9 04/02/2020  Decreased Interest 0  Down, Depressed, Hopeless 0  PHQ - 2 Score 0  Altered sleeping 1  Tired, decreased energy 1  Change in appetite 1  Feeling bad or failure about yourself  0  Trouble concentrating 1  Moving slowly or fidgety/restless 0  Suicidal thoughts 0  PHQ-9 Score 4    Review of Systems  Musculoskeletal:  Positive for back pain, gait problem and neck pain.  Neurological:  Positive for dizziness, tremors, weakness and numbness.  Psychiatric/Behavioral:         Anxiery  All other systems reviewed and are negative.     Objective:   Physical Exam Vitals reviewed.  Constitutional:      Appearance: She is obese.  HENT:     Head: Normocephalic and atraumatic.  Eyes:     Extraocular Movements: Extraocular movements intact.     Conjunctiva/sclera: Conjunctivae normal.     Pupils: Pupils are equal, round, and reactive to light.  Neurological:     Mental Status: She is alert and oriented to person, place, and time.     Comments: Motor strength is 5/5 bilateral deltoid, bicep, tricep, grip, 5/5 right and 4/5 left hip flexor knee extensor ankle dorsiflexor and plantar flexor  Patient standing balance is poor she has difficulty maintaining midline in an AP plane lateral stability appears to be better Sensation reduced below the knee bilateral lower extremities light touch There is no evidence of limb ataxia in the upper or lower limbs The patient did not have a walker with therefore ambulation was not attempted   Psychiatric:        Mood and Affect:  Mood normal.        Behavior: Behavior normal.   The patient has diffuse swelling right lower limb mainly below the knee.       Assessment & Plan:   #1.  History of CVA in 2774 complicated by subsequent prolonged hospitalizations for sepsis and necrotizing fasciitis resulting in sacral wound as well as right lower extremity lymphedema. Per patient report it appears as if functional status is improving significantly.  I did not watch her ambulate given lack of walker but given her neurologic exam would feel that she should be  able to dress herself and do at least sponge bathing independently home health aide would be advised in the transition phase.  At this time the patient does not have any housing to go to.  Financially she relies on her disability check.  The patient states that she would be able to get transportation to dialysis. She plans to resume lymphedema treatment right lower extremity.  We discussed that if she is receiving home health therapy services she may not be able to receive outpatient treatments as well. She has no spasticity that requires any further management. Physical medicine rehab follow-up in 3 months

## 2021-06-22 ENCOUNTER — Other Ambulatory Visit (HOSPITAL_COMMUNITY): Payer: Medicare Other

## 2021-06-22 ENCOUNTER — Other Ambulatory Visit: Payer: Self-pay

## 2021-06-22 ENCOUNTER — Ambulatory Visit (INDEPENDENT_AMBULATORY_CARE_PROVIDER_SITE_OTHER): Payer: Medicare Other | Admitting: Obstetrics & Gynecology

## 2021-06-22 ENCOUNTER — Other Ambulatory Visit (HOSPITAL_COMMUNITY)
Admission: RE | Admit: 2021-06-22 | Discharge: 2021-06-22 | Disposition: A | Discharge status: Home / Self Care | Payer: Medicare Other | Source: Ambulatory Visit | Attending: Obstetrics & Gynecology | Admitting: Obstetrics & Gynecology

## 2021-06-22 ENCOUNTER — Encounter: Payer: Self-pay | Admitting: Obstetrics & Gynecology

## 2021-06-22 VITALS — BP 139/89 | HR 79

## 2021-06-22 DIAGNOSIS — Z992 Dependence on renal dialysis: Secondary | ICD-10-CM | POA: Diagnosis not present

## 2021-06-22 DIAGNOSIS — N185 Chronic kidney disease, stage 5: Secondary | ICD-10-CM | POA: Diagnosis not present

## 2021-06-22 DIAGNOSIS — Z01419 Encounter for gynecological examination (general) (routine) without abnormal findings: Secondary | ICD-10-CM | POA: Insufficient documentation

## 2021-06-22 DIAGNOSIS — Z1231 Encounter for screening mammogram for malignant neoplasm of breast: Secondary | ICD-10-CM

## 2021-06-22 DIAGNOSIS — Z1151 Encounter for screening for human papillomavirus (HPV): Secondary | ICD-10-CM | POA: Insufficient documentation

## 2021-06-22 DIAGNOSIS — N920 Excessive and frequent menstruation with regular cycle: Secondary | ICD-10-CM

## 2021-06-22 NOTE — Progress Notes (Signed)
WELL-WOMAN EXAMINATION Patient name: Nalee Lightle MRN [id number]  Date of birth: Dec 31, 1980 Chief Complaint:   Gynecologic Exam  History of Present Illness:   Miyuki Rzasa is a 41 y.o. G2R4270  female being seen today for a routine well-woman exam.   Menses are every month- typically last 6-7 days.  On a given day typically using medium pad about 8 per day.  Denies intermenstrual bleeding.  Moderate dysmenorrhea- will take tylenol with some improvement.  During menses will note decreased appetite, nausea/vomiting.   She is interested in an endometrial ablation as she does not want to have any periods.  She is tired of the consistent bleeding of her periods.  Prior records reviewed- pt has multiple co-morbidities- including T2DM, ESRD currently on dialysis, cardiac dz with h/o CHF, MI and prior stent placement.  Prior stroke complicated by sepsis and necrotizing fascitis. She currently resides at a SNF facility, [facility]  -Vaginal odor: For the past few weeks she notes vaginal odor.  Denies discharge or itching.  Denies pelvic pain.  Not sexually active.   Patient's last menstrual period was 06/08/2021. Denies issues with her menses The current method of family planning is tubal ligation.   Last pap not sure.  Last mammogram: ordered.   Depression screen [facility] 2/9 06/19/2021 04/02/2020  Decreased Interest 0 0  Down, Depressed, Hopeless 0 0  PHQ - 2 Score 0 0  Altered sleeping 0 1  Tired, decreased energy 0 1  Change in appetite 0 1  Feeling bad or failure about yourself  0 0  Trouble concentrating 0 1  Moving slowly or fidgety/restless 0 0  Suicidal thoughts 0 0  PHQ-9 Score 0 4      Review of Systems:   Pertinent items are noted in HPI Denies any headaches, blurred vision, fatigue, shortness of breath, chest pain, abdominal pain Pertinent History Reviewed:  Reviewed past medical,surgical, social and family history.  Reviewed problem list, medications  and allergies. Physical Assessment:   Vitals:   06/22/21 1428  BP: 139/89  Pulse: 79  There is no height or weight on file to calculate BMI.        Physical Examination:   General appearance - well appearing, and in no distress  Mental status - alert, oriented to person, place, and time  Psych:  She has a normal mood and affect  Skin - warm and dry, normal color, no suspicious lesions noted  Chest - effort normal, all lung fields clear to auscultation bilaterally  Heart - normal rate and regular rhythm  Neck:  midline trachea, no thyromegaly or nodules  Breasts - breasts appear normal, no suspicious masses, no skin or nipple changes or  axillary nodes  Abdomen - soft, nontender, nondistended, colostomy in place  Pelvic - VULVA: normal appearing vulva with no masses, tenderness or lesions  VAGINA: normal appearing vagina with normal color, thick white discharge noted, no lesions  CERVIX: normal appearing cervix without discharge or lesions, no CMT  Thin prep pap is done with HR HPV cotesting  UTERUS: uterus is felt to be normal size, shape, consistency and nontender   ADNEXA: No adnexal masses or tenderness noted.  Extremities:  1+ edema right LE  Chaperone: [place]:  1) Well-Woman Exame -pap collected, reviewed screening guidelines -mammogram ordered to [facility] at [facility]  2) Vaginal discharge -await results of pap for treatment  3)  Desire for ablation -Based on history,  menses sounds moderate -Discussed that ablation would be considered an elective procedure and based on her multiple medical co-morbidities, she is not an ideal surgical candidate -discussed potential significant risk, but for her benefit would review with her PCPs/other physicians    Meds: No orders of the defined types were placed in this encounter.   Follow-up: Return in about 1 year (around 06/22/2022) for Annual.   Janyth Pupa, DO Attending [place], [place] for [facility], [place]

## 2021-06-26 LAB — CYTOLOGY - PAP
Comment: NEGATIVE
Diagnosis: NEGATIVE
Diagnosis: REACTIVE
High risk HPV: NEGATIVE

## 2021-06-29 ENCOUNTER — Ambulatory Visit: Payer: Medicare Other | Admitting: Urology

## 2021-07-01 ENCOUNTER — Telehealth: Payer: Self-pay

## 2021-07-01 ENCOUNTER — Encounter: Payer: Self-pay | Admitting: Specialist

## 2021-07-01 ENCOUNTER — Other Ambulatory Visit: Payer: Self-pay

## 2021-07-01 ENCOUNTER — Ambulatory Visit (INDEPENDENT_AMBULATORY_CARE_PROVIDER_SITE_OTHER): Payer: Medicare Other | Admitting: Specialist

## 2021-07-01 ENCOUNTER — Ambulatory Visit: Payer: Medicare Other | Admitting: Nurse Practitioner

## 2021-07-01 VITALS — BP 116/79 | HR 80 | Ht 65.0 in | Wt 187.0 lb

## 2021-07-01 DIAGNOSIS — G839 Paralytic syndrome, unspecified: Secondary | ICD-10-CM

## 2021-07-01 DIAGNOSIS — M21372 Foot drop, left foot: Secondary | ICD-10-CM

## 2021-07-01 DIAGNOSIS — M62469 Contracture of muscle, unspecified lower leg: Secondary | ICD-10-CM

## 2021-07-01 DIAGNOSIS — M5136 Other intervertebral disc degeneration, lumbar region: Secondary | ICD-10-CM

## 2021-07-01 DIAGNOSIS — M62452 Contracture of muscle, left thigh: Secondary | ICD-10-CM

## 2021-07-01 DIAGNOSIS — I69354 Hemiplegia and hemiparesis following cerebral infarction affecting left non-dominant side: Secondary | ICD-10-CM

## 2021-07-01 DIAGNOSIS — R29898 Other symptoms and signs involving the musculoskeletal system: Secondary | ICD-10-CM | POA: Diagnosis not present

## 2021-07-01 DIAGNOSIS — N200 Calculus of kidney: Secondary | ICD-10-CM

## 2021-07-01 DIAGNOSIS — M4649 Discitis, unspecified, multiple sites in spine: Secondary | ICD-10-CM

## 2021-07-01 MED ORDER — ATENOLOL 25 MG PO TABS: 12.5000 mg | ORAL_TABLET | ORAL | Status: DC

## 2021-07-01 NOTE — Telephone Encounter (Signed)
Called pt to relay Dr Annie Main msg regarding test result. Two identifiers used. Pt confirmed understanding. ?

## 2021-07-01 NOTE — Telephone Encounter (Signed)
-----   Message from Janyth Pupa, DO sent at 06/30/2021  1:30 PM EST ----- ?Pap/HPV negative ?

## 2021-07-01 NOTE — Progress Notes (Signed)
? ?Office Visit Note ?  ?Patient: [name] Barnett           ?Date of Birth: 23-Nov-1980           ?MRN: [id number] ?Visit Date: 07/01/2021 ?             ?Requested by: Julianne Handler, DO ?[address] ?[place],  [place] [id number] ?PCP: Julianne Handler, DO ? ? ?Assessment & Plan: ?Visit Diagnoses:  ?1. 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 Contracture of left hamstring   ?3. Hemiparesis affecting left side as late effect of cerebrovascular accident (CVA) ([place])   ?4. Hemiparesis affecting left side as late effect of cerebrovascular accident [facility])   ?5. Leg weakness, bilateral   ?6. Degenerative disc disease, lumbar   ?7. Discitis of multiple sites of spine   ?8. Foot drop, left   ?9. Nephrolithiasis   ? ? ?Plan: Lumbar discitis is stable and there is no suggestion of acute worsening of her chronic discitis osteomyelitis changes at the L4-5 level.  ?I recommend that she be evaluated by the Rehabilitation specialists at [facility], Dr. Anselm Lis for assessment of her CVA and hemiparesis and determination if further rehabilitation may be able to improve her recovery of  ?Independence and consideration of return to independent living circumstance.  ?Return in 6 weeks for follow up.  ?Compression stocking right leg. ?Continue with PT for gait training and transitioning.  ?I am referring her to Dr. Sharol Given for left heel cord and left hamstring releases to improve her gait .  ?Follow-Up Instructions: No follow-ups on file.  ? ?Orders:  ?No orders of the defined types were placed in this encounter. ? ?Meds ordered this encounter  ?Medications  ? atenolol (TENORMIN) 25 MG tablet  ?  Sig: Take 0.5 tablets (12.5 mg total) by mouth See admin instructions. Every Sun, Mon, Wed and Fri  ? ? ? ? Procedures: ?No procedures performed ? ? ?Clinical Data: ?No additional findings. ? ? ?Subjective: ?Chief Complaint  ?Patient presents with  ? Lower Back - Follow-up  ? Right Leg - Follow-up, Weakness   ? ? ?41 year old female with history of lumbar discitis and right lower extremity DVT with PE and left hemiparesis due to stroke that occurred during the treatment for acute PE. She now is starting to walk and is having trouble resting the left foot on the ground or straightening the left knee. She is using an upright walker. The areas of discitis are healing and her lab suggests that the discitis is healing. Over all her pain is much better.  ? ? ?Review of Systems  ?Constitutional: Negative.   ?HENT: Negative.    ?Eyes: Negative.   ?Respiratory: Negative.    ?Cardiovascular: Negative.   ?Gastrointestinal: Negative.   ?Endocrine: Negative.   ?Genitourinary: Negative.   ?Musculoskeletal: Negative.   ?Skin: Negative.   ?Allergic/Immunologic: Negative.   ?Neurological: Negative.   ?Hematological: Negative.   ?Psychiatric/Behavioral: Negative.    ? ? ?Objective: ?Vital Signs: BP 116/79 (BP Location: Left Arm, Patient Position: Sitting)   Pulse 80   Ht 5\' 5"  (1.651 m)   Wt 187 lb (84.8 kg)   LMP 06/08/2021   BMI 31.12 kg/m?  ? ?Physical Exam ?Constitutional:   ?   Appearance: She is well-developed.  ?HENT:  ?   Head: Normocephalic and atraumatic.  ?Eyes:  ?   Pupils: Pupils are equal, round, and reactive to light.  ?Pulmonary:  ?   Effort:  Pulmonary effort is normal.  ?   Breath sounds: Normal breath sounds.  ?Abdominal:  ?   General: Bowel sounds are normal.  ?   Palpations: Abdomen is soft.  ?Musculoskeletal:  ?   Cervical back: Normal range of motion and neck supple.  ?   Lumbar back: Negative right straight leg raise test and negative left straight leg raise test.  ?Skin: ?   General: Skin is warm and dry.  ?Neurological:  ?   Mental Status: She is alert and oriented to person, place, and time.  ?Psychiatric:     ?   Behavior: Behavior normal.     ?   Thought Content: Thought content normal.     ?   Judgment: Judgment normal.  ? ? ?Back Exam  ? ?Tenderness  ?The patient is experiencing tenderness in the  lumbar. ? ?Range of Motion  ?Extension:  abnormal  ?Flexion:  abnormal  ?Lateral bend right:  abnormal  ?Rotation right:  abnormal  ?Rotation left:  abnormal  ? ?Muscle Strength  ?Right Quadriceps:  5/5  ?Left Quadriceps:  4/5  ?Right Hamstrings:  4/5  ?Left Hamstrings:  4/5  ? ?Tests  ?Straight leg raise right: negative ?Straight leg raise left: negative ? ?Reflexes  ?Patellar:  2/4 ?Babinski's sign: normal  ? ?Other  ?Toe walk: abnormal ?Heel walk: abnormal ?Sensation: normal ?Gait: circumducted  ?Erythema: no back redness ?Scars: absent ? ?Comments:  Left hamstring and left ankle equinus due to contractures and mild spasticity.  ? ? ? ? ?Specialty Comments:  ?No specialty comments available. ? ?Imaging: ?No results found. ? ? ?PMFS History: ?Patient Active Problem List  ? Diagnosis Date Noted  ? Chronic kidney disease (CKD) stage G5/A1, glomerular filtration rate (GFR) less than or equal to 15 mL/min/1.73 square meter and albuminuria creatinine ratio less than 30 mg/g (H[facility]) 06/22/2021  ? Dialysis patient [facility]) 06/22/2021  ? BV (bacterial vaginosis) 04/02/2020  ? Vaginal itching 04/02/2020  ? Vaginal odor 04/02/2020  ? Vaginal discharge 04/02/2020  ? Sepsis secondary to UTI ([place]) 01/13/2020  ? Acute respiratory failure with hypoxia ([place]) 01/13/2020  ? Right leg swelling 01/13/2020  ? Sacral decubitus ulcer 01/13/2020  ? Anemia due to end stage renal disease ([place]) 01/13/2020  ? Essential hypertension 01/13/2020  ? Hyperlipidemia 01/13/2020  ? Type 2 diabetes mellitus ([place]) 01/13/2020  ? ?Past Medical History:  ?Diagnosis Date  ? Diabetes mellitus without complication ([place])   ? Hyperlipidemia   ? Hypertension   ? Renal disorder   ? Stroke [facility])   ? Vitamin D deficiency   ?  ?Family History  ?Problem Relation Age of Onset  ? Cancer Paternal Grandmother   ? Heart disease Father   ? Hypertension Father   ? Diabetes Father   ? Glaucoma Father   ? Heart disease Mother   ? Hypertension Mother   ? Diabetes Mother   ?  Diabetes Sister   ?     borderline  ? Hypertension Sister   ?  ?Past Surgical History:  ?Procedure Laterality Date  ? cardiac stents    ? CATARACT EXTRACTION W/PHACO Left 08/29/2020  ? Procedure: CATARACT EXTRACTION PHACO AND INTRAOCULAR LENS PLACEMENT LEFT EYE;  Surgeon: Baruch Goldmann, MD;  Location: [facility];  Service: Ophthalmology;  Laterality: Left;  left ?CDE=29.30  ? CATARACT EXTRACTION W/PHACO Right 11/28/2020  ? Procedure: CATARACT EXTRACTION PHACO AND INTRAOCULAR LENS PLACEMENT (IOC);  Surgeon: Baruch Goldmann, MD;  Location: [facility];  Service: Ophthalmology;  Laterality:  Right;  CDE 4.35  ? COLOSTOMY    ? TUBAL LIGATION    ? wound on buttocks    ? ?Social History  ? ?Occupational History  ? Not on file  ?Tobacco Use  ? Smoking status: Never  ? Smokeless tobacco: Never  ?Vaping Use  ? Vaping Use: Never used  ?Substance and Sexual Activity  ? Alcohol use: Never  ? Drug use: Never  ? Sexual activity: Not Currently  ?  Birth control/protection: Surgical  ?  Comment: tubal  ? ? ? ? ? ? ?

## 2021-07-01 NOTE — Patient Instructions (Signed)
Plan: Lumbar discitis is stable and there is no suggestion of acute worsening of her chronic discitis osteomyelitis changes at the L4-5 level.  ?I recommend that she be evaluated by the Rehabilitation specialists at [facility], Dr. Anselm Lis for assessment of her CVA and hemiparesis and determination if further rehabilitation may be able to improve her recovery of  ?Independence and consideration of return to independent living circumstance.  ?Return in 6 weeks for follow up.  ?Compression stocking right leg. ?Continue with PT for gait training and transitioning.  ?I am referring her to Dr. Sharol Given for left heel cord and left hamstring releases to improve her gait .  ?

## 2021-07-02 ENCOUNTER — Encounter: Payer: Self-pay | Admitting: Nurse Practitioner

## 2021-07-02 ENCOUNTER — Other Ambulatory Visit (HOSPITAL_COMMUNITY): Payer: Self-pay | Admitting: Emergency Medicine

## 2021-07-02 ENCOUNTER — Other Ambulatory Visit: Payer: Self-pay | Admitting: Emergency Medicine

## 2021-07-02 DIAGNOSIS — M726 Necrotizing fasciitis: Secondary | ICD-10-CM

## 2021-07-02 LAB — BASIC METABOLIC PANEL
BUN: 45 — AB (ref 4–21)
CO2: 18 (ref 13–22)
Chloride: 93 — AB (ref 99–108)
Creatinine: 7.4 — AB (ref <=1.1)
Glucose: 86
Potassium: 4.5 mEq/L (ref 3.5–5.1)
Sodium: 132 — AB (ref 137–147)

## 2021-07-02 LAB — HEPATIC FUNCTION PANEL
ALT: 16 U/L (ref 7–35)
AST: 16 (ref 13–35)
Alkaline Phosphatase: 73 (ref 25–125)
Bilirubin, Total: 0.2

## 2021-07-02 LAB — COMPREHENSIVE METABOLIC PANEL
Albumin: 4 (ref 3.5–5.0)
Calcium: 9.1 (ref 8.7–10.7)
Globulin: 2.8

## 2021-07-02 LAB — CBC AND DIFFERENTIAL
HCT: 34 — AB (ref 36–46)
Hemoglobin: 11.4 — AB (ref 12.0–16.0)
Neutrophils Absolute: 3.9
Platelets: 183 10*3/uL (ref 150–400)
WBC: 7.1

## 2021-07-02 LAB — CBC: RBC: 3.86 — AB (ref 3.87–5.11)

## 2021-07-03 ENCOUNTER — Other Ambulatory Visit: Payer: Self-pay

## 2021-07-03 ENCOUNTER — Ambulatory Visit (HOSPITAL_COMMUNITY)
Admission: RE | Admit: 2021-07-03 | Discharge: 2021-07-03 | Disposition: A | Discharge status: Home / Self Care | Payer: Medicare Other | Source: Ambulatory Visit | Attending: Urology | Admitting: Urology

## 2021-07-03 DIAGNOSIS — N200 Calculus of kidney: Secondary | ICD-10-CM | POA: Diagnosis not present

## 2021-07-06 ENCOUNTER — Encounter: Payer: Self-pay | Admitting: Adult Health

## 2021-07-06 ENCOUNTER — Ambulatory Visit (INDEPENDENT_AMBULATORY_CARE_PROVIDER_SITE_OTHER): Payer: Medicare Other | Admitting: Adult Health

## 2021-07-06 ENCOUNTER — Other Ambulatory Visit (HOSPITAL_COMMUNITY)
Admission: RE | Admit: 2021-07-06 | Discharge: 2021-07-06 | Disposition: A | Discharge status: Home / Self Care | Payer: Medicare Other | Source: Ambulatory Visit | Attending: Adult Health | Admitting: Adult Health

## 2021-07-06 ENCOUNTER — Other Ambulatory Visit: Payer: Self-pay

## 2021-07-06 VITALS — BP 150/97 | HR 83

## 2021-07-06 DIAGNOSIS — N760 Acute vaginitis: Secondary | ICD-10-CM

## 2021-07-06 DIAGNOSIS — N898 Other specified noninflammatory disorders of vagina: Secondary | ICD-10-CM | POA: Insufficient documentation

## 2021-07-06 DIAGNOSIS — B9689 Other specified bacterial agents as the cause of diseases classified elsewhere: Secondary | ICD-10-CM | POA: Diagnosis not present

## 2021-07-06 LAB — POCT WET PREP (WET MOUNT)
Clue Cells Wet Prep Whiff POC: POSITIVE
WBC, Wet Prep HPF POC: POSITIVE

## 2021-07-06 MED ORDER — METRONIDAZOLE 0.75 % VA GEL
VAGINAL | 1 refills | Status: AC
Start: 2021-07-06 — End: ?

## 2021-07-06 NOTE — Progress Notes (Signed)
?  Subjective:  ?  ? Patient ID: [name] Barnett, female   DOB: 1981/02/03, 41 y.o.   MRN: [id number] ? ?HPI ?[name] Barnett is a 41 year old black female,separated, [id number], in complaining of vaginal odor and discharge, passed tissue in shower 06/10/21 that was necrotic, with no dysplasia or malignancy per pathology. She has colostomy and is on dialysis and resides at [facility]. She wants Dr Nelda Marseille to call her Wednesday, about her periods, she wants them stopped.  ?She is in wheelchair, but maneuvers it well. ? ?Lab Results  ?Component Value Date  ? DIAGPAP  06/22/2021  ?  - Negative for Intraepithelial Lesions or Malignancy (NILM)  ? DIAGPAP - Benign reactive/reparative changes 06/22/2021  ? [place] Negative 06/22/2021  ? PCP is Dr Jenna Luo.  ? ?Review of Systems ?+vaginal odor ?+vaginal discharge ?No sex in 3 years  ?Reviewed past medical,surgical, social and family history. Reviewed medications and allergies.  ?   ?Objective:  ? Physical Exam ?BP (!) 150/97 (BP Location: Right Arm, Patient Position: Sitting, Cuff Size: Normal)   Pulse 83   LMP 06/08/2021   ?  Skin warm and dry.Pelvic: external genitalia is normal in appearance no lesions, vagina: white discharge with odor,no lesions, urethra has no lesions or masses noted, cervix:smooth, uterus: normal size, shape and contour, non tender, no masses felt, adnexa: no masses or tenderness noted. Bladder is non tender and no masses felt. Wet prep: + for clue cells and +WBCs. ?CV swab obtained. ? [place] - 07/06/21 1457   ? ?  ? Pregnancy Intention Screening  ? Does the patient want to become pregnant in the next year? No   ? Does the patient's partner want to become pregnant in the next year? No   ? Would the patient like to discuss contraceptive options today? No   ?  ? Contraception Wrap Up  ? Current Method Female Sterilization   ? End Method Female Sterilization   ? Contraception Counseling Provided No   ? ?  ?  ? ?  ? Examination chaperoned by Celene Squibb  LPN ? ?Assessment:  ?   ?1. Vaginal odor ?Will rx metrogel  ? ?2. Vaginal discharge ?CV swab sent for trich,BV and yeast  ? ?3. BV (bacterial vaginosis) ?Will rx metrogel ?Meds ordered this encounter  ?Medications  ? metroNIDAZOLE (METROGEL VAGINAL) 0.75 % vaginal gel  ?  Sig: Use 1 applicator in vagina at bedtime for 5 nights then prn  ?  Dispense:  70 g  ?  Refill:  1  ?  Order Specific Question:   Supervising Provider  ?  Answer:   Tania Ade H [[id number]]  ?  ?   ?Plan:  ?   ?Follow up prn  ?   ?

## 2021-07-08 ENCOUNTER — Encounter (HOSPITAL_BASED_OUTPATIENT_CLINIC_OR_DEPARTMENT_OTHER): Payer: Medicare Other | Attending: Internal Medicine | Admitting: General Surgery

## 2021-07-08 ENCOUNTER — Other Ambulatory Visit: Payer: Self-pay

## 2021-07-08 DIAGNOSIS — S91115A Laceration without foreign body of left lesser toe(s) without damage to nail, initial encounter: Secondary | ICD-10-CM | POA: Insufficient documentation

## 2021-07-08 DIAGNOSIS — X58XXXA Exposure to other specified factors, initial encounter: Secondary | ICD-10-CM | POA: Insufficient documentation

## 2021-07-08 DIAGNOSIS — N186 End stage renal disease: Secondary | ICD-10-CM | POA: Insufficient documentation

## 2021-07-08 DIAGNOSIS — I132 Hypertensive heart and chronic kidney disease with heart failure and with stage 5 chronic kidney disease, or end stage renal disease: Secondary | ICD-10-CM | POA: Insufficient documentation

## 2021-07-08 DIAGNOSIS — G8222 Paraplegia, incomplete: Secondary | ICD-10-CM | POA: Insufficient documentation

## 2021-07-08 DIAGNOSIS — I5032 Chronic diastolic (congestive) heart failure: Secondary | ICD-10-CM | POA: Insufficient documentation

## 2021-07-08 DIAGNOSIS — E1122 Type 2 diabetes mellitus with diabetic chronic kidney disease: Secondary | ICD-10-CM | POA: Diagnosis not present

## 2021-07-08 DIAGNOSIS — Z992 Dependence on renal dialysis: Secondary | ICD-10-CM | POA: Diagnosis not present

## 2021-07-08 DIAGNOSIS — L89322 Pressure ulcer of left buttock, stage 2: Secondary | ICD-10-CM | POA: Diagnosis present

## 2021-07-08 DIAGNOSIS — Z8673 Personal history of transient ischemic attack (TIA), and cerebral infarction without residual deficits: Secondary | ICD-10-CM | POA: Insufficient documentation

## 2021-07-08 LAB — CERVICOVAGINAL ANCILLARY ONLY
Bacterial Vaginitis (gardnerella): NEGATIVE
Candida Glabrata: POSITIVE — AB
Candida Vaginitis: NEGATIVE
Comment: NEGATIVE
Comment: NEGATIVE
Comment: NEGATIVE
Comment: NEGATIVE
Trichomonas: NEGATIVE

## 2021-07-08 NOTE — Progress Notes (Signed)
[name] Barnett, MAIOLO ([id number]) Visit Report for 07/08/2021 Chief Complaint Document Details Patient Name: Date of Service: [name] Barnett, [name] Barnett 07/08/2021 12:30 PM Medical Record Number: [id number] Patient Account Number: [id number] Date of Birth/Sex: Treating RN: Apr 15, 1981 (41 y.o. F) Primary Care Provider: SITA FA LWA LLA, SHA RMIN Other Clinician: Referring Provider: Treating Provider/Extender: Fredirick Maudlin SITA FA LWA LLA, SHA RMIN Weeks in Treatment: 27 Information Obtained from: Patient Chief Complaint 06/15/2019; patient is here for review of the wound on her lower sacrum 12/31/2020; patient is here for review of the wound on her right buttock Electronic Signature(s) Signed: 07/08/2021 1:30:32 PM By: Fredirick Maudlin MD FACS Entered By: Fredirick Maudlin on 07/08/2021 13:30:31 -------------------------------------------------------------------------------- Debridement Details Patient Name: Date of Service: [name] Barnett, [name] Barnett 07/08/2021 12:30 PM Medical Record Number: [id number] Patient Account Number: [id number] Date of Birth/Sex: Treating RN: 1981-01-25 (41 y.o. F) [place], Mechele Claude Primary Care Provider: SITA FA LWA LLA, SHA RMIN Other Clinician: Referring Provider: Treating Provider/Extender: Fredirick Maudlin SITA FA LWA LLA, SHA RMIN Weeks in Treatment: 27 Debridement Performed for Assessment: Wound #6 Perineum Performed By: Physician Fredirick Maudlin, MD Debridement Type: Debridement Level of Consciousness (Pre-procedure): Awake and Alert Pre-procedure Verification/Time Out Yes - 13:23 Taken: Start Time: 13:23 Pain Control: Other : Benzocaine T Area Debrided (L x W): otal 1.7 (cm) x 1 (cm) = 1.7 (cm) Tissue and other material debrided: Non-Viable, Slough, Slough Level: Non-Viable Tissue Debridement Description: Selective/Open Wound Instrument: Curette Bleeding: Minimum Hemostasis Achieved: Pressure End Time: 13:24 Procedural Pain: 0 Post Procedural Pain:  0 Response to Treatment: Procedure was tolerated well Level of Consciousness (Post- Awake and Alert procedure): Post Debridement Measurements of Total Wound Length: (cm) 1.7 Stage: Category/Stage II Width: (cm) 1 Depth: (cm) 0.3 Volume: (cm) 0.401 Character of Wound/Ulcer Post Debridement: Improved Post Procedure Diagnosis Same as Pre-procedure Electronic Signature(s) Signed: 07/08/2021 2:39:20 PM By: Fredirick Maudlin MD FACS Signed: 07/08/2021 5:56:52 PM By: Dellie Catholic RN Entered By: Dellie Catholic on 07/08/2021 13:29:15 -------------------------------------------------------------------------------- HPI Details Patient Name: Date of Service: [name] Barnett, [name] 07/08/2021 12:30 PM Medical Record Number: [id number] Patient Account Number: [id number] Date of Birth/Sex: Treating RN: 04-Sep-1980 (41 y.o. F) Primary Care Provider: SITA FA LWA LLA, SHA RMIN Other Clinician: Referring Provider: Treating Provider/Extender: Fredirick Maudlin SITA FA LWA LLA, SHA RMIN Weeks in Treatment: 27 History of Present Illness HPI Description: Admission 06/15/2019 This is a 41 year old woman who comes to [place] from [facility] skilled facility in [place]. I am able to follow a lot of her history on care everywhere in epic. The patient is a type II diabetic and has been on dialysis. 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 I am not exactly sure of the nature of the stroke at this present time. She has made a decent recovery and is now up walking with a walker. In August 2020 she was admitted to [facility] in [place] with L4-L5 discitis. Culture and sensitivity showed Klebsiella. She was discharged on 8 weeks of ceftazidime. In mid September she is readmitted to the hospital with a pressure ulcer on her lower sacrum and necrotizing fasciitis. She required an extensive surgical debridement. CT scan of the  pelvis at 1 point showed communication with the rectum. A colostomy was recommended and placed. Culture of this area showed Klebsiella and yeast. She was discharged on an extended course of meropenem and Diflucan. [facility] [place] We  do not have any of these records. I think she was discharged to another nursing home but is come to [facility] skilled facility in [facility]. She was sent down here for review of the remaining wound. By review of the pictures that the patient had from September this is made considerable improvement. She tells me that the wound VAC was discontinued at the end of December 2020. Currently the facility is using a form of silver alginate to the wound surface. Past medical history type 2 diabetes with chronic renal failure on dialysis Tuesday Thursday and Saturday. CVA with left hemiparesis in June 2020 but she appears to be making a good recovery. Chronic diastolic heart failure, hypertension 2/19; patient's wound area slightly smaller. The area that was towards her anus has closed over. We are using Santyl covered with Hydrofera Blue. We have not been able to get her lab work apparently her veins are very difficult for phlebotomy. They are trying to get the blood work done where she dialyzes in [facility] but that requires some logistical issues. 2/26; not much change in the wound. Still a nonviable surface we have been using Santyl and Hydrofera Blue. I changed her to [facility] only. She tells me she has rigorously offloading this area she also complains of some pain 3/5; still no change here. I switch to pure Hydrofera Blue. She still complains of a lot of pain although is a big wound it seems out of proportion to what I might expect. This was initially a surgical wound secondary to necrotizing wound infection. She is on dialysis. Other than that she thinks she is offloading this fairly rigorously.  She has not been systemically unwell 3/26; I switched to Iodoflex 3 weeks ago. This really seems to have helped, much better surface and slight improvement in the surface area. 4/26; the patient is still on Iodoflex she missed an appointment 2 weeks ago. Doing slightly smaller. She is on dialysis 5/10; using Hydrofera Blue. The major improvement here is the complete lack of depth. She has islands of epithelialization 09/24/19-Patient comes in at 2 weeks, dimensions about the same, there is an [place] of epithelium at margin of wound, no slough noted 10/15/2019 upon evaluation today patient appears to be doing well with regard to her wound. She has been tolerating the dressing changes in the sacral region without complication and at this point she seems to be healing quite nicely. I am extremely pleased with how things are progressing and the epithelial tissue seems to be spreading from the central portion as well which is also excellent. 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 We have been using Hydrofera Blue. The smaller area which has depth at 0.7 cm there is a question about whether they have been packing anything into this or just flaring Hydrofera Blue over the top 9/23; its been more than 2 months since this patient was here. She still has a small area distally which is the remanent of her stage IV wound. They have been using Hydrofera Blue. She also has an area on the left gluteal 10/22; 1 month follow-up. I think the original sacral wound is closed however the patient has 2 areas distally 1 over the coccyx a small pinpoint area and the other into the gluteal cleft. Neither 1 of these looks particularly infected. I thought they might be connected but they were not. 11/15; the original sacral wound is  closed patient only has 1 remaining area. 1 of these is in the gluteal cleft just below the coccyx. The area over the coccyx from last  time I think is all so closed 12/31/2020; this is a patient that we saw for a pressure ulcer over her lower sacrum and coccyx for a long period of time in 2021. She eventually healed over. She is at [facility] skilled facility largely secondary to a remote CVA she had. She is also end-stage renal disease on dialysis secondary to type 2 diabetes. She tells [place] that since about mid June she has had an area more distally than the original sacrum/coccyx wound it looks as though they are putting silver alginate in this area. She is limited in her ability to offload this I think spending most of her time in a wheelchair although she can stand to transfer. She has an ostomy During her original stay here in 2021 I also list that she had a more distal area into her perineum that closed over well before the more proximal coccyx/sacral wound. She had a CT scan of the abdomen and pelvis on 12/01/2020. This did note first chronic sacral decubitus along the right aspect of her distal sacrum and coccyx. There was not felt to be bone destruction she has chronic changes of bilateral sacroiliitis and chronic endplate destruction and sclerosis at L4-L5 secondary to previous osteomyelitis however no new findings were felt to be seen in terms of bone problems in this area 01/14/2021; 2-week follow-up. Her wound is smaller essentially in the coccyx area. We have been using silver collagen. 10/5; 3-week follow-up. The patient returns with her area in the coccyx healed however distally in the perineum there is a fairly long stage II area. I am assuming this patient's wound is a pressure related area. Although this would not be a usual pressure area for a normal situation her immobility secondary to remote CVA, dialysis etc. make her at risk for pressure areas in an otherwise unusual setting. The patient is fairly upset stating that we were told about this last time. I do not have this in my records. Her original wound was small  and just about healed at that point and this visit was arranged in follow-up. If there was a new wound more distally into her perineum we did not look at it 11/2; patient's wound is slightly smaller. This is in the gluteal cleft on the left. Using silver alginate. She tells me she is aggressively offloading this area. 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 We have been using silver alginate. Arrives in clinic today with the wound larger. 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 We have been using silver alginate. PCR culture I did last week was polymicrobial with multiple organisms we put Polysporin under the silver alginate at the facility. According to the patient this got started 05/13/2021; the wound looks slightly smaller. This is in the gluteal cleft on the left. She is using silver alginate with topical antibiotics underneath. The topical antibiotics relate to a deep tissue culture I did some weeks ago. This showed a lawn of low titer bacteria. I am not sure how much she is offloading this area both at dialysis and during the  day at the facility would be the other issue 2/8; continued nice improvement the wound is smaller. This is on the gluteal cleft on the left. She is using topical antibiotics/triple antibiotic with silver alginate. 07/08/2021: The wound in her perineum continues to improve. Daily dressing changes with triple antibiotic and silver alginate are being employed. There is a small amount of slough at the base. A new problem today is that somehow she suffered a traumatic laceration to her left fourth toe. It is bleeding briskly in clinic. She is insensate in that area and has no idea how the injury occurred. Electronic  Signature(s) Signed: 07/08/2021 1:31:50 PM By: Fredirick Maudlin MD FACS Entered By: Fredirick Maudlin on 07/08/2021 13:31:50 -------------------------------------------------------------------------------- Chemical Cauterization Details Patient Name: Date of Service: [name] Barnett, [name] Barnett 07/08/2021 12:30 PM Medical Record Number: [id number] Patient Account Number: [id number] Date of Birth/Sex: Treating RN: January 22, 1981 (41 y.o. F) Primary Care Provider: SITA FA LWA LLA, SHA RMIN Other Clinician: Referring Provider: Treating Provider/Extender: Fredirick Maudlin SITA FA LWA LLA, SHA RMIN Weeks in Treatment: 27 Procedure Performed for: Wound #7 Left T Fourth oe Performed By: Physician Fredirick Maudlin, MD Post Procedure Diagnosis Same as Pre-procedure Electronic Signature(s) Signed: 07/08/2021 1:30:23 PM By: Fredirick Maudlin MD FACS Entered By: Fredirick Maudlin on 07/08/2021 13:30:23 -------------------------------------------------------------------------------- Physical Exam Details Patient Name: Date of Service: [name] Barnett, [name] Barnett 07/08/2021 12:30 PM Medical Record Number: [id number] Patient Account Number: [id number] Date of Birth/Sex: Treating RN: 1981-01-25 (41 y.o. F) Primary Care Provider: SITA FA LWA LLA, SHA RMIN Other Clinician: Referring Provider: Treating Provider/Extender: Fredirick Maudlin SITA FA LWA LLA, SHA RMIN Weeks in Treatment: 27 Constitutional . . . . No acute distress. Respiratory Normal work of breathing on room air. Notes 07/08/2021: Wound examthe area in the perineum continues to epithelialize. There is still a bit of depth to this with fibrinous slough at the base. This was easily removed with a curette. The laceration on her left fourth toe is at about the PIP joint and is full-thickness with exposed fat. No tendon or bone identified. It is bleeding briskly. Bleeding was controlled with silver nitrate chemical cautery. Electronic Signature(s) Signed:  07/08/2021 1:34:10 PM By: Fredirick Maudlin MD FACS Entered By: Fredirick Maudlin on 07/08/2021 13:34:10 -------------------------------------------------------------------------------- Physician Orders Details Patient Name: Date of Service: [name] Barnett, [name] Barnett 07/08/2021 12:30 PM Medical Record Number: [id number] Patient Account Number: [id number] Date of Birth/Sex: Treating RN: 01-31-1981 (39 y.o. 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 - Dr Celine Ahr Return A Bathing/ Shower/ Hygiene May shower with protection but do not get wound dressing(s) wet. Off-Loading Turn and reposition every 2 hours Non Wound Condition Other Non Wound Condition Orders/Instructions: - Apply nystatin powder and gauze between right and left gluteus Wound Treatment Wound #6 - Perineum Cleanser: Wound Cleanser 1 x Per Day/30 Days Discharge Instructions: Cleanse the wound with wound cleanser prior to applying a clean dressing using gauze sponges, not tissue or cotton balls. Peri-Wound Care: Skin Prep 1 x Per Day/30 Days Discharge Instructions: Use skin prep as directed Topical: Triple Antibiotic Ointment, 1 (oz) Tube 1 x Per Day/30 Days Discharge  Instructions: Apply directly to wound bed, under aquacel Prim Dressing: AquacelAg Advantage Dressing, 2X2 (in/in) 1 x Per Day/30 Days ary Discharge Instructions: Apply Aquacel to wound bed Secondary Dressing: Zetuvit Plus Silicone Border Dressing 4x4 (in/in) 1 x Per Day/30 Days Discharge Instructions: Apply  silicone border over primary dressing as directed. Wound #7 - T Fourth oe Wound Laterality: Left Cleanser: Soap and Water 1 x Per Day/30 Days Discharge Instructions: May shower and wash wound with dial antibacterial soap and water prior to dressing change. Cleanser: Wound Cleanser 1 x Per Day/30 Days Discharge Instructions: Cleanse the wound with wound cleanser prior to applying a clean dressing using gauze sponges, not tissue or cotton balls. Prim Dressing: Maxorb Extra Calcium Alginate 2x2 in 1 x Per Day/30 Days ary Discharge Instructions: Apply calcium alginate to wound bed as instructed Secured With: Conforming Stretch Gauze Bandage, Sterile 2x75 (in/in) 1 x Per Day/30 Days Discharge Instructions: Secure with stretch gauze as directed. Secured With: 11M Medipore Public affairs consultant Surgical T 2x10 (in/yd) 1 x Per Day/30 Days ape Discharge Instructions: Secure with tape as directed. Electronic Signature(s) Signed: 07/08/2021 2:39:20 PM By: Fredirick Maudlin MD FACS Signed: 07/08/2021 5:56:52 PM By: Dellie Catholic RN Previous Signature: 07/08/2021 1:34:29 PM Version By: Fredirick Maudlin MD FACS Entered By: Dellie Catholic on 07/08/2021 13:34:45 -------------------------------------------------------------------------------- Problem List Details Patient Name: Date of Service: [name] Barnett, [name] Barnett 07/08/2021 12:30 PM Medical Record Number: [id number] Patient Account Number: [id number] Date of Birth/Sex: Treating RN: March 25, 1981 (41 y.o. 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, incomplete 03/04/2021 No Yes S91.115A Laceration without foreign body of left lesser toe(s) without damage to nail,  07/08/2021 No Yes initial encounter Inactive Problems ICD-10 Code Description Active Date Inactive Date L89.312 Pressure ulcer of right buttock, stage 2 12/31/2020 12/31/2020 L89.152 Pressure ulcer of sacral region, stage 2 02/04/2021 02/04/2021 Resolved Problems Electronic Signature(s) Signed: 07/08/2021 1:29:24 PM By: Fredirick Maudlin MD FACS Entered By: Fredirick Maudlin on 07/08/2021 13:29:24 -------------------------------------------------------------------------------- Progress Note Details Patient Name: Date of Service: [name] Barnett, [place] 07/08/2021 12:30 PM Medical Record Number: [id number] Patient Account Number: [id number] Date of Birth/Sex: Treating RN: [birth year] (41 y.o. F) Primary Care Provider: SITA FA LWA LLA, SHA RMIN Other Clinician: Referring Provider: Treating Provider/Extender: Fredirick Maudlin SITA FA LWA LLA, SHA RMIN Weeks in Treatment: 27 Subjective Chief Complaint Information obtained from Patient 06/15/2019; patient is here for review of the wound on her lower sacrum 12/31/2020; patient is here for review of the wound on her right buttock History of Present Illness (HPI) Admission 06/15/2019 This is a 41 year old woman who comes to [place] from [facility] skilled facility in [place]. I am able to follow a lot of her history on care everywhere in epic. The patient is a type II diabetic and has been on dialysis. 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 I am not exactly sure of the nature of the stroke at this present time. She has made a decent recovery and is now up walking with a walker. In August 2020 she was admitted to [facility] in [place] with L4-L5 discitis. Culture and sensitivity showed Klebsiella. She was discharged on 8 weeks of ceftazidime. In mid  September she is readmitted to the hospital with a pressure ulcer on her lower sacrum and necrotizing fasciitis. She  required an extensive surgical debridement. CT scan of the pelvis at 1 point showed communication with the rectum. A colostomy was recommended and placed. Culture of this area showed Klebsiella and yeast. She was discharged on an extended course of meropenem and Diflucan. [facility] [place] We do not have any of these records. I think she was discharged to another nursing home but is come to [facility] skilled facility in [facility]. She was sent down here for review of the remaining wound. By review of the pictures that the patient had from September this is made considerable improvement. She tells me that the wound VAC was discontinued at the end of December 2020. Currently the facility is using a form of silver alginate to the wound surface. Past medical history type 2 diabetes with chronic renal failure on dialysis Tuesday Thursday and Saturday. CVA with left hemiparesis in June 2020 but she appears to be making a good recovery. Chronic diastolic heart failure, hypertension 2/19; patient's wound area slightly smaller. The area that was towards her anus has closed over. We are using Santyl covered with Hydrofera Blue. We have not been able to get her lab work apparently her veins are very difficult for phlebotomy. They are trying to get the blood work done where she dialyzes in [facility] but that requires some logistical issues. 2/26; not much change in the wound. Still a nonviable surface we have been using Santyl and Hydrofera Blue. I changed her to [facility] only. She tells me she has rigorously offloading this area she also complains of some pain 3/5; still no change here. I switch to pure Hydrofera Blue. She still complains of a lot of pain although is a big wound it seems out of proportion to what I might expect. This was initially a surgical wound secondary to necrotizing wound infection. She is on dialysis. Other than  that she thinks she is offloading this fairly rigorously. She has not been systemically unwell 3/26; I switched to Iodoflex 3 weeks ago. This really seems to have helped, much better surface and slight improvement in the surface area. 4/26; the patient is still on Iodoflex she missed an appointment 2 weeks ago. Doing slightly smaller. She is on dialysis 5/10; using Hydrofera Blue. The major improvement here is the complete lack of depth. She has islands of epithelialization 09/24/19-Patient comes in at 2 weeks, dimensions about the same, there is an [place] of epithelium at margin of wound, no slough noted 10/15/2019 upon evaluation today patient appears to be doing well with regard to her wound. She has been tolerating the dressing changes in the sacral region without complication and at this point she seems to be healing quite nicely. I am extremely pleased with how things are progressing and the epithelial tissue seems to be spreading from the central portion as well which is also excellent. 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 We have been using Hydrofera Blue. The smaller area which has depth at 0.7 cm there is a question about whether they have been packing anything into this or just flaring Hydrofera Blue over the top 9/23; its been more than 2 months since this patient was here. She still has a small area distally which is the remanent of her stage IV  wound. They have been using Hydrofera Blue. She also has an area on the left gluteal 10/22; 1 month follow-up. I think the original sacral wound is closed however the patient has 2 areas distally 1 over the coccyx a small pinpoint area and the other into the gluteal cleft. Neither 1 of these looks particularly infected. I thought they might be connected but they were not. 11/15; the original sacral wound is closed patient only has 1 remaining area. 1 of these is in the gluteal cleft  just below the coccyx. The area over the coccyx from last time I think is all so closed 12/31/2020; this is a patient that we saw for a pressure ulcer over her lower sacrum and coccyx for a long period of time in 2021. She eventually healed over. She is at [facility] skilled facility largely secondary to a remote CVA she had. She is also end-stage renal disease on dialysis secondary to type 2 diabetes. She tells [place] that since about mid June she has had an area more distally than the original sacrum/coccyx wound it looks as though they are putting silver alginate in this area. She is limited in her ability to offload this I think spending most of her time in a wheelchair although she can stand to transfer. She has an ostomy During her original stay here in 2021 I also list that she had a more distal area into her perineum that closed over well before the more proximal coccyx/sacral wound. She had a CT scan of the abdomen and pelvis on 12/01/2020. This did note first chronic sacral decubitus along the right aspect of her distal sacrum and coccyx. There was not felt to be bone destruction she has chronic changes of bilateral sacroiliitis and chronic endplate destruction and sclerosis at L4-L5 secondary to previous osteomyelitis however no new findings were felt to be seen in terms of bone problems in this area 01/14/2021; 2-week follow-up. Her wound is smaller essentially in the coccyx area. We have been using silver collagen. 10/5; 3-week follow-up. The patient returns with her area in the coccyx healed however distally in the perineum there is a fairly long stage II area. I am assuming this patient's wound is a pressure related area. Although this would not be a usual pressure area for a normal situation her immobility secondary to remote CVA, dialysis etc. make her at risk for pressure areas in an otherwise unusual setting. The patient is fairly upset stating that we were told about this last time. I do  not have this in my records. Her original wound was small and just about healed at that point and this visit was arranged in follow-up. If there was a new wound more distally into her perineum we did not look at it 11/2; patient's wound is slightly smaller. This is in the gluteal cleft on the left. Using silver alginate. She tells me she is aggressively offloading this area. 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 We have been using silver alginate. Arrives in clinic today with the wound larger. 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 We have been using silver alginate. PCR culture I did last week was polymicrobial with multiple organisms we put Polysporin under the silver alginate  at the facility. According to the patient this got started 05/13/2021; the wound looks slightly smaller. This is in the gluteal cleft on the left. She is using silver alginate with topical antibiotics underneath. The topical antibiotics relate to a deep tissue culture I did some weeks ago. This showed a lawn of low titer bacteria. I am not sure how much she is offloading this area both at dialysis and during the day at the facility would be the other issue 2/8; continued nice improvement the wound is smaller. This is on the gluteal cleft on the left. She is using topical antibiotics/triple antibiotic with silver alginate. 07/08/2021: The wound in her perineum continues to improve. Daily dressing changes with triple antibiotic and silver alginate are being employed. There is a small amount of slough at the base. A new problem today is that somehow she suffered a traumatic laceration to her left fourth toe. It is bleeding briskly in clinic. She is insensate in that area  and has no idea how the injury occurred. Patient History Information obtained from Patient, Chart. Family History Diabetes - Maternal Grandparents, Heart Disease - Mother,Father, Hypertension - Maternal Grandparents, Kidney Disease - Maternal Grandparents, Lung Disease - Mother, Stroke - Mother,Father, Thyroid Problems - Mother,Siblings, No family history of Cancer, Hereditary Spherocytosis, Seizures, Tuberculosis. Social History Never smoker, Marital Status - Separated, Alcohol Use - Never, Drug Use - No History, Caffeine Use - Daily - coffee. Medical History Eyes Patient has history of Cataracts, Glaucoma Ear/Nose/Mouth/Throat Denies history of Chronic sinus problems/congestion, Middle ear problems Hematologic/Lymphatic Patient has history of Anemia Cardiovascular Patient has history of Congestive Heart Failure, Hypertension Denies history of Angina, Arrhythmia, Coronary Artery Disease, Deep Vein Thrombosis, Hypotension, Myocardial Infarction, Peripheral Arterial Disease, Peripheral Venous Disease, Phlebitis, Vasculitis Endocrine Patient has history of Type II Diabetes Genitourinary Patient has history of End Stage Renal Disease - HD Integumentary (Skin) Denies history of History of Burn Musculoskeletal Denies history of Gout, Rheumatoid Arthritis, Osteoarthritis, Osteomyelitis Neurologic Patient has history of Neuropathy Denies history of Dementia, Quadriplegia, Paraplegia, Seizure Disorder Oncologic Denies history of Received Chemotherapy, Received Radiation Psychiatric Patient has history of Confinement Anxiety Denies history of Anorexia/bulimia Hospitalization/Surgery History - right arm AV fistula. - cardiac cath with stents. - sacral wound debridement. - left knee surgery. - catract extraction. - colostomy. Medical A Surgical History Notes nd Cardiovascular hyperlipidemia Gastrointestinal GERD, diverting colostomy Musculoskeletal left side  weakness Neurologic CVA left mild hemiplegia, lumbar discitis Psychiatric depression, anxiety Objective Constitutional No acute distress. Vitals Time Taken: 1:01 PM, Temperature: 98.3 F, Pulse: 74 bpm, Respiratory Rate: 18 breaths/min, Blood Pressure: 139/87 mmHg, Capillary Blood Glucose: 152 mg/dl. Respiratory Normal work of breathing on room air. General Notes: 07/08/2021: Wound examoothe area in the perineum continues to epithelialize. There is still a bit of depth to this with fibrinous slough at the base. This was easily removed with a curette. The laceration on her left fourth toe is at about the PIP joint and is full-thickness with exposed fat. No tendon or bone identified. It is bleeding briskly. Bleeding was controlled with silver nitrate chemical cautery. Integumentary (Hair, Skin) Wound #6 status is Open. Original cause of wound was Pressure Injury. The date acquired was: 02/04/2021. The wound has been in treatment 22 weeks. The wound is located on the Perineum. The wound measures 1.7cm length x 1cm width x 0.3cm depth; 1.335cm^2 area and 0.401cm^3 volume. There is Fat Layer (Subcutaneous Tissue) exposed. There is a medium amount of serosanguineous drainage noted. The wound margin  is flat and intact. There is large (67-100%) pink granulation within the wound bed. There is a small (1-33%) amount of necrotic tissue within the wound bed including Adherent Slough. Wound #7 status is Open. Original cause of wound was Trauma. The date acquired was: 07/08/2021. The wound is located on the Left T Fourth. The wound oe measures 0.4cm length x 1.8cm width x 0.3cm depth; 0.565cm^2 area and 0.17cm^3 volume. There is Fat Layer (Subcutaneous Tissue) exposed. There is no tunneling or undermining noted. There is a medium amount of sanguinous drainage noted. There is large (67-100%) red granulation within the wound bed. Assessment Active Problems ICD-10 Pressure ulcer of left buttock, stage  2 Paraplegia, incomplete Laceration without foreign body of left lesser toe(s) without damage to nail, initial encounter Procedures Wound #6 Pre-procedure diagnosis of Wound #6 is a Pressure Ulcer located on the Perineum . There was a Selective/Open Wound Non-Viable Tissue Debridement with a total area of 1.7 sq cm performed by Fredirick Maudlin, MD. With the following instrument(s): Curette to remove Non-Viable tissue/material. Material removed includes [facility] after achieving pain control using Other (Benzocaine). No specimens were taken. A time out was conducted at 13:23, prior to the start of the procedure. A Minimum amount of bleeding was controlled with Pressure. The procedure was tolerated well with a pain level of 0 throughout and a pain level of 0 following the procedure. Post Debridement Measurements: 1.7cm length x 1cm width x 0.3cm depth; 0.401cm^3 volume. Post debridement Stage noted as Category/Stage II. Character of Wound/Ulcer Post Debridement is improved. Post procedure Diagnosis Wound #6: Same as Pre-Procedure Wound #7 Pre-procedure diagnosis of Wound #7 is a Trauma, Other located on the Left T Fourth . An Chemical Cauterization procedure was performed by Michaelle Copas, MD. Post procedure Diagnosis Wound #7: Same as Pre-Procedure Plan 07/08/2021: The perineal wound continues to improve. Minimal slough, easily removed. Continue triple antibiotic ointment and silver alginate daily. The laceration to her left fourth toe was bleeding briskly in clinic today, but I was able to get this controlled with silver nitrate. Recommend calcium alginate and gauze under a bit of pressure with tape. Use tube net and tape to secure. Change daily. Follow-up in 1 week. Electronic Signature(s) Signed: 07/08/2021 1:36:04 PM By: Fredirick Maudlin MD FACS Entered By: Fredirick Maudlin on 07/08/2021 13:36:04 -------------------------------------------------------------------------------- HxROS  Details Patient Name: Date of Service: [name] Barnett, GO[place] 07/08/2021 12:30 PM Medical Record Number: [id number] Patient Account Number: [id number] Date of Birth/Sex: Treating RN: 02-Nov-1980 (41 y.o. F) Primary Care Provider: SITA FA LWA LLA, SHA RMIN Other Clinician: Referring Provider: Treating Provider/Extender: Fredirick Maudlin SITA FA LWA LLA, SHA RMIN Weeks in Treatment: 27 Information Obtained From Patient Chart Eyes Medical History: Positive for: Cataracts; Glaucoma Ear/Nose/Mouth/Throat Medical History: Negative for: Chronic sinus problems/congestion; Middle ear problems Hematologic/Lymphatic Medical History: Positive for: Anemia Cardiovascular Medical History: Positive for: Congestive Heart Failure; Hypertension Negative for: Angina; Arrhythmia; Coronary Artery Disease; Deep Vein Thrombosis; Hypotension; Myocardial Infarction; Peripheral Arterial Disease; Peripheral Venous Disease; Phlebitis; Vasculitis Past Medical History Notes: hyperlipidemia Gastrointestinal Medical History: Past Medical History Notes: GERD, diverting colostomy Endocrine Medical History: Positive for: Type II Diabetes Time with diabetes: 65 yrs Treated with: Insulin Blood sugar tested every day: Yes Tested : 3 times per day Genitourinary Medical History: Positive for: End Stage Renal Disease - HD Integumentary (Skin) Medical History: Negative for: History of Burn Musculoskeletal Medical History: Negative for: Gout; Rheumatoid Arthritis; Osteoarthritis; Osteomyelitis Past Medical History Notes: left side weakness Neurologic Medical History:  Positive for: Neuropathy Negative for: Dementia; Quadriplegia; Paraplegia; Seizure Disorder Past Medical History Notes: CVA left mild hemiplegia, lumbar discitis Oncologic Medical History: Negative for: Received Chemotherapy; Received Radiation Psychiatric Medical History: Positive for: Confinement Anxiety Negative for:  Anorexia/bulimia Past Medical History Notes: depression, anxiety HBO Extended History Items Eyes: Eyes: Cataracts Glaucoma Immunizations Pneumococcal Vaccine: Received Pneumococcal Vaccination: Yes Received Pneumococcal Vaccination On or After 60th Birthday: No Implantable Devices Yes Hospitalization / Surgery History Type of Hospitalization/Surgery right arm AV fistula cardiac cath with stents sacral wound debridement left knee surgery catract extraction colostomy Family and Social History Cancer: No; Diabetes: Yes - Maternal Grandparents; Heart Disease: Yes - Mother,Father; Hereditary Spherocytosis: No; Hypertension: Yes - Maternal Grandparents; Kidney Disease: Yes - Maternal Grandparents; Lung Disease: Yes - Mother; Seizures: No; Stroke: Yes - Mother,Father; Thyroid Problems: Yes - Mother,Siblings; Tuberculosis: No; Never smoker; Marital Status - Separated; Alcohol Use: Never; Drug Use: No History; Caffeine Use: Daily - coffee; Financial Concerns: No; Food, Clothing or Shelter Needs: No; Support System Lacking: No; Transportation Concerns: No Physician Affirmation I have reviewed and agree with the above information. Electronic Signature(s) Signed: 07/08/2021 2:39:20 PM By: Fredirick Maudlin MD FACS Entered By: Fredirick Maudlin on 07/08/2021 13:32:01 -------------------------------------------------------------------------------- SuperBill Details Patient Name: Date of Service: [name] Barnett, [name] Barnett 07/08/2021 Medical Record Number: [id number] Patient Account Number: [id number] Date of Birth/Sex: Treating RN: May 15, 1980 (41 y.o. F) Primary Care Provider: SITA FA LWA LLA, SHA RMIN Other Clinician: Referring Provider: Treating Provider/Extender: Fredirick Maudlin SITA FA LWA LLA, SHA RMIN Weeks in Treatment: 27 Diagnosis Coding ICD-10 Codes Code Description H74.142 Pressure ulcer of left buttock, stage 2 G82.22 Paraplegia, incomplete S91.115A Laceration without foreign  body of left lesser toe(s) without damage to nail, initial encounter Facility Procedures CPT4 Code: [id number] Description: 34356 - DEBRIDE WOUND 1ST 20 SQ CM OR < ICD-10 Diagnosis Description L89.322 Pressure ulcer of left buttock, stage 2 Modifier: Quantity: 1 Physician Procedures : CPT4 Code Description Modifier [id number] - WC PHYS LEVEL 4 - EST PT ICD-10 Diagnosis Description S91.115A Laceration without foreign body of left lesser toe(s) without damage to nail, initial encounter Quantity: 1 : [id number] - WC PHYS DEBR WO ANESTH 20 SQ CM ICD-10 Diagnosis Description L89.322 Pressure ulcer of left buttock, stage 2 Quantity: 1 : [id number] - WC PHYS CHEM CAUT GRAN TISSUE ICD-10 Diagnosis Description S91.115A Laceration without foreign body of left lesser toe(s) without damage to nail, initial encounter Quantity: 1 Electronic Signature(s) Signed: 07/08/2021 1:37:03 PM By: Fredirick Maudlin MD FACS Entered By: Fredirick Maudlin on 07/08/2021 13:37:03

## 2021-07-09 ENCOUNTER — Telehealth: Payer: Self-pay | Admitting: Obstetrics & Gynecology

## 2021-07-09 ENCOUNTER — Telehealth: Payer: Self-pay | Admitting: Nurse Practitioner

## 2021-07-09 NOTE — Telephone Encounter (Signed)
This patient is from [facility] and they send labs prior to appt. Please advise  ?

## 2021-07-09 NOTE — Telephone Encounter (Signed)
I looked in the lab basket and I do not see the labs. I am not able to get into [facility]. Do you know if she goes to [place]? ?

## 2021-07-09 NOTE — Telephone Encounter (Signed)
Called patient to review her desire for an ablation.  Discussed that based on her prior medical history she is not an ideal candidate for surgery.  While the procedure is minor would need approval from PCP and other specialist to proceed with case.  Reviewed conservative options- declined pills.  Would be willing to try IUD- plan to schedule IUD insertion at next available. ? ?Janyth Pupa, DO ?Attending [employer] [facility], Faculty Practice ?[facility] ? ? ?

## 2021-07-09 NOTE — Telephone Encounter (Signed)
[facility] at [phone] and spoke to [place] and he is going to Geophysicist/field seismologist of nursing fax to [place]. ?

## 2021-07-09 NOTE — Telephone Encounter (Signed)
This patient has an appt tomorrow, do you have her labs? It says in appt notes it was placed in nurse box on 2/28. Please advise ?

## 2021-07-09 NOTE — Progress Notes (Signed)
[name] AVREY, FLANAGIN ([id number]) Visit Report for 07/08/2021 Arrival Information Details Patient Name: Date of Service: [name] Barnett, [name] Barnett 07/08/2021 12:30 PM Medical Record Number: [id number] Patient Account Number: [id number] Date of Birth/Sex: Treating RN: [birth year] (41 y.o. F) Sharyn Creamer Primary Care Olan Kurek: SITA FA LWA LLA, SHA RMIN Other Clinician: Referring Syann Cupples: Treating Allee Busk/Extender: Fredirick Maudlin SITA FA LWA LLA, SHA RMIN Weeks in Treatment: 27 Visit Information History Since Last Visit Added or deleted any medications: No Patient Arrived: Wheel Chair Any new allergies or adverse reactions: No Arrival Time: 12:51 Had a fall or experienced change in No Transfer Assistance: None activities of daily living that may affect Patient Identification Verified: Yes risk of falls: Secondary Verification Process Completed: Yes Signs or symptoms of abuse/neglect since last visito No Patient Requires Transmission-Based Precautions: No Hospitalized since last visit: No Patient Has Alerts: No Implantable device outside of the clinic excluding No cellular tissue based products placed in the center since last visit: Has Dressing in Place as Prescribed: Yes Pain Present Now: Yes Electronic Signature(s) Signed: 07/08/2021 5:20:27 PM By: Sharyn Creamer RN, BSN Entered By: Sharyn Creamer on 07/08/2021 12:52:43 -------------------------------------------------------------------------------- Encounter Discharge Information Details Patient Name: Date of Service: [name] Barnett, [name] Barnett 07/08/2021 12:30 PM Medical Record Number: [id number] Patient Account Number: [id number] Date of Birth/Sex: Treating RN: January 29, 1981 (61 y.o. F) Dellie Catholic Primary Care Keylin Podolsky: SITA FA LWA LLA, SHA RMIN Other Clinician: Referring Maryrose Colvin: Treating Kimila Papaleo/Extender: Fredirick Maudlin SITA FA LWA LLA, SHA RMIN Weeks in Treatment: 27 Encounter Discharge Information Items Post Procedure  Vitals Discharge Condition: Stable Temperature (F): 98.3 Ambulatory Status: Wheelchair Pulse (bpm): 74 Discharge Destination: Home Respiratory Rate (breaths/min): 18 Transportation: Private Auto Blood Pressure (mmHg): 139/87 Accompanied By: self Schedule Follow-up Appointment: Yes Clinical Summary of Care: Patient Declined Electronic Signature(s) Signed: 07/08/2021 5:56:52 PM By: Dellie Catholic RN Entered By: Dellie Catholic on 07/08/2021 17:56:11 -------------------------------------------------------------------------------- Lower Extremity Assessment Details Patient Name: Date of Service: [name] Barnett, [name] Barnett 07/08/2021 12:30 PM Medical Record Number: [id number] Patient Account Number: [id number] Date of Birth/Sex: Treating RN: Jun 13, 1980 (41 y.o. F) Primary Care Zariel Capano: SITA FA LWA LLA, SHA RMIN Other Clinician: Referring Daje Stark: Treating Cristiana Yochim/Extender: Fredirick Maudlin SITA FA LWA LLA, SHA RMIN Weeks in Treatment: 27 Edema Assessment Assessed: [Left: No] [Right: No] E[Left: dema] [Right: :] Calf Left: Right: Point of Measurement: From Medial Instep 37 cm Ankle Left: Right: Point of Measurement: From Medial Instep 26 cm Vascular Assessment Pulses: Dorsalis Pedis Palpable: [Left:Yes] Electronic Signature(s) Signed: 07/08/2021 5:20:27 PM By: Sharyn Creamer RN, BSN Entered By: Sharyn Creamer on 07/08/2021 12:55:54 -------------------------------------------------------------------------------- Multi Wound Chart Details Patient Name: Date of Service: [name] Barnett, [name] Barnett 07/08/2021 12:30 PM Medical Record Number: [id number] Patient Account Number: [id number] Date of Birth/Sex: Treating RN: 11-16-1980 (41 y.o. F) Primary Care Dimitry Holsworth: SITA FA LWA LLA, SHA RMIN Other Clinician: Referring Nikai Quest: Treating Kayte Borchard/Extender: Fredirick Maudlin SITA FA LWA LLA, SHA RMIN Weeks in Treatment: 27 Vital Signs Height(in): Capillary Blood Glucose(mg/dl):  152 Weight(lbs): Pulse(bpm): 21 Body Mass Index(BMI): Blood Pressure(mmHg): 139/87 Temperature(F): 98.3 Respiratory Rate(breaths/min): 18 Photos: [6:Perineum] [7:Left T Fourth oe] [N/A:N/A N/A] Wound Location: [6:Pressure Injury] [7:Trauma] [N/A:N/A] Wounding Event: [6:Pressure Ulcer] [7:Trauma, Other] [N/A:N/A] Primary Etiology: [6:Cataracts, Glaucoma, Anemia,] [7:Cataracts, Glaucoma, Anemia,] [N/A:N/A] Comorbid History: [6:Congestive Heart Failure, Hypertension, Type II Diabetes, End Hypertension, Type II Diabetes, End Stage Renal Disease, Neuropathy, Confinement Anxiety 02/04/2021] [7:Congestive Heart Failure, Stage Renal Disease, Neuropathy,  Confinement Anxiety 07/08/2021] [N/A:N/A] Date Acquired: [6:22] [7:0] [N/A:N/A] Weeks of Treatment: [6:Open] [7:Open] [N/A:N/A]  Wound Status: [6:No] [7:No] [N/A:N/A] Wound Recurrence: [6:No] [7:Yes] [N/A:N/A] Clustered Wound: [6:1.7x1x0.3] [7:0.4x1.8x0.3] [N/A:N/A] Measurements L x W x D (cm) [6:1.335] [7:0.565] [N/A:N/A] A (cm) : rea [6:0.401] [7:0.17] [N/A:N/A] Volume (cm) : [6:24.10%] [7:0.00%] [N/A:N/A] % Reduction in A [6:rea: -13.90%] [7:0.00%] [N/A:N/A] % Reduction in Volume: [6:Category/Stage II] [7:Full Thickness Without Exposed] [N/A:N/A] Classification: [6:Medium] [7:Support Structures Medium] [N/A:N/A] Exudate A mount: [6:Serosanguineous] [7:Sanguinous] [N/A:N/A] Exudate Type: [6:red, brown] [7:red] [N/A:N/A] Exudate Color: [6:Flat and Intact] [7:N/A] [N/A:N/A] Wound Margin: [6:Large (67-100%)] [7:Large (67-100%)] [N/A:N/A] Granulation A mount: [6:Pink] [7:Red] [N/A:N/A] Granulation Quality: [6:Small (1-33%)] [7:N/A] [N/A:N/A] Necrotic A mount: [6:Fat Layer (Subcutaneous Tissue): Yes Fat Layer (Subcutaneous Tissue): Yes N/A] Exposed Structures: [6:Small (1-33%)] [7:Fascia: No Tendon: No Muscle: No Joint: No Bone: No None] [N/A:N/A] Epithelialization: [6:Debridement - Selective/Open Wound N/A]  [N/A:N/A] Debridement: Pre-procedure Verification/Time Out 13:23 [7:N/A] [N/A:N/A] Taken: [6:Other] [7:N/A] [N/A:N/A] Pain Control: [6:Slough] [7:N/A] [N/A:N/A] Tissue Debrided: [6:Non-Viable Tissue] [7:N/A] [N/A:N/A] Level: [6:1.7] [7:N/A] [N/A:N/A] Debridement A (sq cm): [6:rea Curette] [7:N/A] [N/A:N/A] Instrument: [6:Minimum] [7:N/A] [N/A:N/A] Bleeding: [6:Pressure] [7:N/A] [N/A:N/A] Hemostasis A chieved: [6:0] [7:N/A] [N/A:N/A] Procedural Pain: [6:0] [7:N/A] [N/A:N/A] Post Procedural Pain: [6:Procedure was tolerated well] [7:N/A] [N/A:N/A] Debridement Treatment Response: [6:1.7x1x0.3] [7:N/A] [N/A:N/A] Post Debridement Measurements L x W x D (cm) [6:0.401] [7:N/A] [N/A:N/A] Post Debridement Volume: (cm) [6:Category/Stage II] [7:N/A] [N/A:N/A] Post Debridement Stage: [6:Debridement] [7:N/A] [N/A:N/A] Treatment Notes Electronic Signature(s) Signed: 07/08/2021 1:29:45 PM By: Fredirick Maudlin MD FACS Entered By: Fredirick Maudlin on 07/08/2021 13:29:44 -------------------------------------------------------------------------------- Multi-Disciplinary Care Plan Details Patient Name: Date of Service: [name] DA AMARYLLIS, MALMQUIST 07/08/2021 12:30 PM Medical Record Number: [id number] Patient Account Number: [id number] Date of Birth/Sex: Treating RN: [birth year] (41 y.o. F) [place]ton, Mechele Claude Primary Care Maryellen Dowdle: SITA FA LWA LLA, SHA RMIN Other Clinician: Referring Johnica Armwood: Treating Romelle Reiley/Extender: Fredirick Maudlin SITA FA LWA LLA, SHA RMIN Weeks in Treatment: 27 Active Inactive Wound/Skin Impairment Nursing Diagnoses: Impaired tissue integrity Knowledge deficit related to ulceration/compromised skin integrity Goals: Patient will have a decrease in wound volume by X% from date: (specify in notes) Date Initiated: 12/31/2020 Date Inactivated: 02/04/2021 Target Resolution Date: 01/07/2021 Goal Status: Met Patient/caregiver will verbalize understanding of skin care regimen Date Initiated:  12/31/2020 Target Resolution Date: 08/07/2021 Goal Status: Active Ulcer/skin breakdown will have a volume reduction of 30% by week 4 Date Initiated: 12/31/2020 Date Inactivated: 01/14/2021 Target Resolution Date: 01/10/2021 Goal Status: Met Ulcer/skin breakdown will have a volume reduction of 50% by week 8 Date Initiated: 12/31/2020 Date Inactivated: 01/14/2021 Target Resolution Date: 01/23/2021 Goal Status: Met Ulcer/skin breakdown will have a volume reduction of 80% by week 12 Date Initiated: 12/31/2020 Date Inactivated: 02/04/2021 Target Resolution Date: 02/18/2021 Goal Status: Met Interventions: Assess patient/caregiver ability to obtain necessary supplies Assess patient/caregiver ability to perform ulcer/skin care regimen upon admission and as needed Assess ulceration(s) every visit Notes: Electronic Signature(s) Signed: 07/08/2021 5:56:52 PM By: Dellie Catholic RN Entered By: Dellie Catholic on 07/08/2021 17:53:45 -------------------------------------------------------------------------------- Pain Assessment Details Patient Name: Date of Service: [name] Barnett, [name] Barnett 07/08/2021 12:30 PM Medical Record Number: [id number] Patient Account Number: [id number] Date of Birth/Sex: Treating RN: [birth year] (41 y.o. Donalda Ewings Primary Care [place] Current: SITA FA LWA LLA, SHA RMIN Other Clinician: Referring Kerly Rigsbee: Treating Cap Massi/Extender: Fredirick Maudlin SITA FA LWA LLA, SHA RMIN Weeks in Treatment: 27 Active Problems Location of Pain Severity and Description of Pain Patient Has Paino Yes Site Locations Rate the pain. Current Pain Level: 8 Worst Pain Level: 10 Least Pain Level: 5 Tolerable Pain Level: 7 Character of Pain Describe the  Pain: Aching, Burning Pain Management and Medication Current Pain Management: Medication: Yes Electronic Signature(s) Signed: 07/08/2021 5:20:27 PM By: Sharyn Creamer RN, BSN Entered By: Sharyn Creamer on 07/08/2021  12:53:34 -------------------------------------------------------------------------------- Patient/Caregiver Education Details Patient Name: Date of Service: [name] Barnett, [name] Barnett 3/8/2023andnbsp12:30 PM Medical Record Number: [id number] Patient Account Number: [id number] Date of Birth/Gender: Treating RN: November 14, 1980 (2 y.o. F) Dellie Catholic Primary Care Physician: SITA FA LWA LLA, SHA RMIN Other Clinician: Referring Physician: Treating Physician/Extender: Fredirick Maudlin SITA FA LWA LLA, SHA RMIN Weeks in Treatment: 58 Education Assessment Education Provided To: Patient Education Topics Provided Wound/Skin Impairment: Methods: Explain/Verbal Responses: Return demonstration correctly Electronic Signature(s) Signed: 07/08/2021 5:56:52 PM By: Dellie Catholic RN Entered By: Dellie Catholic on 07/08/2021 17:54:02 -------------------------------------------------------------------------------- Wound Assessment Details Patient Name: Date of Service: [name] Barnett, [name] Barnett 07/08/2021 12:30 PM Medical Record Number: [id number] Patient Account Number: [id number] Date of Birth/Sex: Treating RN: November 17, 1980 (41 y.o. F) Sharyn Creamer Primary Care Sequoya Hogsett: SITA FA LWA LLA, SHA RMIN Other Clinician: Referring Nahla Lukin: Treating Sirenity Shew/Extender: Fredirick Maudlin SITA FA LWA LLA, SHA RMIN Weeks in Treatment: 27 Wound Status Wound Number: 6 Primary Pressure Ulcer Etiology: Wound Location: Perineum Wound Open Wounding Event: Pressure Injury Status: Date Acquired: 02/04/2021 Comorbid Cataracts, Glaucoma, Anemia, Congestive Heart Failure, Weeks Of Treatment: 22 History: Hypertension, Type II Diabetes, End Stage Renal Disease, Clustered Wound: No Neuropathy, Confinement Anxiety Photos Wound Measurements Length: (cm) 1.7 Width: (cm) 1 Depth: (cm) 0.3 Area: (cm) 1.335 Volume: (cm) 0.401 % Reduction in Area: 24.1% % Reduction in Volume: -13.9% Epithelialization: Small  (1-33%) Wound Description Classification: Category/Stage II Wound Margin: Flat and Intact Exudate Amount: Medium Exudate Type: Serosanguineous Exudate Color: red, brown Foul Odor After Cleansing: No Slough/Fibrino Yes Wound Bed Granulation Amount: Large (67-100%) Exposed Structure Granulation Quality: Pink Fat Layer (Subcutaneous Tissue) Exposed: Yes Necrotic Amount: Small (1-33%) Necrotic Quality: Adherent Slough Treatment Notes Wound #6 (Perineum) Cleanser Wound Cleanser Discharge Instruction: Cleanse the wound with wound cleanser prior to applying a clean dressing using gauze sponges, not tissue or cotton balls. Peri-Wound Care Skin Prep Discharge Instruction: Use skin prep as directed Topical Triple Antibiotic Ointment, 1 (oz) Tube Discharge Instruction: Apply directly to wound bed, under aquacel Primary Dressing AquacelAg Advantage Dressing, 2X2 (in/in) Discharge Instruction: Apply Aquacel to wound bed Secondary Dressing Zetuvit Plus Silicone Border Dressing 4x4 (in/in) Discharge Instruction: Apply silicone border over primary dressing as directed. Secured With Compression Wrap Compression Stockings Environmental education officer) Signed: 07/08/2021 5:20:27 PM By: Sharyn Creamer RN, BSN Signed: 07/08/2021 5:56:52 PM By: Dellie Catholic RN Entered By: Dellie Catholic on 07/08/2021 13:12:37 -------------------------------------------------------------------------------- Wound Assessment Details Patient Name: Date of Service: [name] Barnett, [name] Barnett 07/08/2021 12:30 PM Medical Record Number: [id number] Patient Account Number: [id number] Date of Birth/Sex: Treating RN: [birth year] (41 y.o. F) Sharyn Creamer Primary Care Draven Natter: SITA FA LWA LLA, SHA RMIN Other Clinician: Referring Amayia Ciano: Treating Flavius Repsher/Extender: Fredirick Maudlin SITA FA LWA LLA, SHA RMIN Weeks in Treatment: 27 Wound Status Wound Number: 7 Primary Trauma, Other Etiology: Wound Location: Left T  Fourth oe Wound Open Wounding Event: Trauma Status: Date Acquired: 07/08/2021 Comorbid Cataracts, Glaucoma, Anemia, Congestive Heart Failure, Weeks Of Treatment: 0 History: Hypertension, Type II Diabetes, End Stage Renal Disease, Clustered Wound: Yes Neuropathy, Confinement Anxiety Photos Wound Measurements Length: (cm) 0.4 Width: (cm) 1.8 Depth: (cm) 0.3 Area: (cm) 0.565 Volume: (cm) 0.17 % Reduction in Area: 0% % Reduction in Volume: 0% Epithelialization: None Tunneling: No Undermining: No Wound Description Classification: Full Thickness Without Exposed Support Structures Exudate  Amount: Medium Exudate Type: Sanguinous Exudate Color: red Foul Odor After Cleansing: No Slough/Fibrino No Wound Bed Granulation Amount: Large (67-100%) Exposed Structure Granulation Quality: Red Fascia Exposed: No Fat Layer (Subcutaneous Tissue) Exposed: Yes Tendon Exposed: No Muscle Exposed: No Joint Exposed: No Bone Exposed: No Treatment Notes Wound #7 (Toe Fourth) Wound Laterality: Left Cleanser Soap and Water Discharge Instruction: May shower and wash wound with dial antibacterial soap and water prior to dressing change. Wound Cleanser Discharge Instruction: Cleanse the wound with wound cleanser prior to applying a clean dressing using gauze sponges, not tissue or cotton balls. Peri-Wound Care Topical Primary Dressing Maxorb Extra Calcium Alginate 2x2 in Discharge Instruction: Apply calcium alginate to wound bed as instructed Secondary Dressing Secured With Conforming Stretch Gauze Bandage, Sterile 2x75 (in/in) Discharge Instruction: Secure with stretch gauze as directed. 12M Medipore Soft Cloth Surgical T 2x10 (in/yd) ape Discharge Instruction: Secure with tape as directed. Compression Wrap Compression Stockings Add-Ons Electronic Signature(s) Signed: 07/08/2021 5:20:27 PM By: Sharyn Creamer RN, BSN Signed: 07/09/2021 8:29:09 AM By: Sandre Kitty Entered By: Sandre Kitty on 07/08/2021 13:04:07 -------------------------------------------------------------------------------- Vitals Details Patient Name: Date of Service: [name] DA TISHIA, MAESTRE 07/08/2021 12:30 PM Medical Record Number: [id number] Patient Account Number: [id number] Date of Birth/Sex: Treating RN: 03-06-1981 (41 y.o. F) Sharyn Creamer Primary Care Shemicka Cohrs: SITA FA LWA LLA, SHA RMIN Other Clinician: Referring Levone Otten: Treating Esmee Fallaw/Extender: Fredirick Maudlin SITA FA LWA LLA, SHA RMIN Weeks in Treatment: 27 Vital Signs Time Taken: 13:01 Temperature (F): 98.3 Pulse (bpm): 74 Respiratory Rate (breaths/min): 18 Blood Pressure (mmHg): 139/87 Capillary Blood Glucose (mg/dl): 152 Reference Range: 80 - 120 mg / dl Electronic Signature(s) Signed: 07/08/2021 5:20:27 PM By: Sharyn Creamer RN, BSN Entered By: Sharyn Creamer on 07/08/2021 13:05:13

## 2021-07-10 ENCOUNTER — Ambulatory Visit: Payer: Medicare Other | Admitting: Nurse Practitioner

## 2021-07-13 ENCOUNTER — Ambulatory Visit (HOSPITAL_COMMUNITY): Payer: Medicare Other | Attending: Emergency Medicine | Admitting: Physical Therapy

## 2021-07-13 ENCOUNTER — Ambulatory Visit: Payer: Medicare Other | Admitting: Orthopedic Surgery

## 2021-07-13 ENCOUNTER — Telehealth: Payer: Self-pay | Admitting: *Deleted

## 2021-07-13 ENCOUNTER — Other Ambulatory Visit: Payer: Self-pay

## 2021-07-13 ENCOUNTER — Other Ambulatory Visit: Payer: Self-pay | Admitting: Adult Health

## 2021-07-13 ENCOUNTER — Telehealth: Payer: Self-pay

## 2021-07-13 DIAGNOSIS — R262 Difficulty in walking, not elsewhere classified: Secondary | ICD-10-CM | POA: Insufficient documentation

## 2021-07-13 DIAGNOSIS — I890 Lymphedema, not elsewhere classified: Secondary | ICD-10-CM | POA: Insufficient documentation

## 2021-07-13 MED ORDER — BORIC ACID VAGINAL 600 MG VA SUPP: 1.0000 | Freq: Every day | VAGINAL | 0 refills | Status: AC

## 2021-07-13 NOTE — Therapy (Signed)
OUTPATIENT PHYSICAL THERAPY Lymphedema EVALUATION  Patient Name: [name] Barnett MRN: [id number] DOB:16-Jul-1980, 41 y.o., female Today's Date: 07/13/2021   PT End of Session - 07/13/21 1516     Visit Number 1    Number of Visits 12    Date for PT Re-Evaluation 08/12/21    Authorization Type medicare/medicaid 80/20    Progress Note Due on Visit 10    PT Start Time 1315    PT Stop Time 1430    PT Time Calculation (min) 75 min    Activity Tolerance Patient tolerated treatment well    Behavior During Therapy WFL for tasks assessed/performed             Past Medical History:  Diagnosis Date   Diabetes mellitus without complication ([place])    Hyperlipidemia    Hypertension    Renal disorder    Stroke [facility])    Vitamin D deficiency    Past Surgical History:  Procedure Laterality Date   cardiac stents     CATARACT EXTRACTION W/PHACO Left 08/29/2020   Procedure: CATARACT EXTRACTION PHACO AND INTRAOCULAR LENS PLACEMENT LEFT EYE;  Surgeon: Baruch Goldmann, MD;  Location: [facility];  Service: Ophthalmology;  Laterality: Left;  left CDE=29.30   CATARACT EXTRACTION W/PHACO Right 11/28/2020   Procedure: CATARACT EXTRACTION PHACO AND INTRAOCULAR LENS PLACEMENT (IOC);  Surgeon: Baruch Goldmann, MD;  Location: [facility];  Service: Ophthalmology;  Laterality: Right;  CDE 4.35   COLOSTOMY     TUBAL LIGATION     wound on buttocks     Patient Active Problem List   Diagnosis Date Noted   Chronic kidney disease (CKD) stage G5/A1, glomerular filtration rate (GFR) less than or equal to 15 mL/min/1.73 square meter and albuminuria creatinine ratio less than 30 mg/g (HCC) 06/22/2021   Dialysis patient ([place]) 06/22/2021   BV (bacterial vaginosis) 04/02/2020   Vaginal itching 04/02/2020   Vaginal odor 04/02/2020   Vaginal discharge 04/02/2020   Sepsis secondary to UTI ([place]) 01/13/2020   Acute respiratory failure with hypoxia ([place]) 01/13/2020   Right leg swelling 01/13/2020   Sacral decubitus ulcer  01/13/2020   Anemia due to end stage renal disease ([place]) 01/13/2020   Essential hypertension 01/13/2020   Hyperlipidemia 01/13/2020   Type 2 diabetes mellitus ([place]) 01/13/2020    PCP: Julianne Handler, DO  REFERRING PROVIDER: Luci Bank, MD  REFERRING DIAG: Lymphedema  THERAPY DIAG:  Lymphedema  ONSET DATE: 01/13/2020 with acute exacerbation  SUBJECTIVE  SUBJECTIVE STATEMENT:   Pt states that she never received a pump as she lives in a facility and the facility will have to purchase the pump.  She had compression garments but they were not keeping her swelling down, (she states they are white, therefore most likely Ted hose).  She noted that her right leg edema was increasing and she did not want the edema to get as bad as it was.   PERTINENT HISTORY: #1. History of CVA in 9622 complicated by subsequent prolonged hospitalizations for sepsis and necrotizing fasciitis resulting in sacral wound as well as right lower extremity lymphedema.   PAIN:  Are you having pain? No  PRECAUTIONS: Other: cellulitis   WEIGHT BEARING RESTRICTIONS No  FALLS:  Has patient fallen in last 6 months? No,  LIVING ENVIRONMENT: Lives with: lives with their family and lives in an assisted living facility Lives in: Other [place]: No;       PRIOR LEVEL OF FUNCTION: Requires assistive device for independence  PATIENT GOALS decreased swelling in her leg, to be able to walk with a walker without dragging her foot.   OBJECTIVE  COGNITION:  Overall cognitive status: Within functional limits for tasks assessed   PALPATION: Noted induration   OBSERVATIONS / OTHER ASSESSMENTS: Pt with limited mobility   Gait:   Pt ambulated in // forward and backward with verbal cuing x 2    LYMPHEDEMA  ASSESSMENTS:     LE LANDMARK RIGHT 07/13/2021  At groin   30 cm proximal to suprapatella   20 cm proximal to suprapatella 62.5   10 cm proximal to suprapatella 56  At midpatella / popliteal crease 41.2  30 cm proximal to floor at lateral plantar foot 40.1  20 cm proximal to floor at lateral plantar foot 38.6  10 cm proximal to floor at lateral plantar foot 30.3  Circumference of ankle/heel 34   5 cm proximal to 1st MTP joint 26  Across MTP joint 26  Around proximal great toe   (Blank rows = not tested)  LE LANDMARK LEFT 07/13/2021  At groin   30 cm proximal to suprapatella   20 cm proximal to suprapatella 59.8  10 cm proximal to suprapatella 51  At midpatella / popliteal crease 41.8  30 cm proximal to floor at lateral plantar foot 34.4  20 cm proximal to floor at lateral plantar foot 30.2  10 cm proximal to floor at lateral plantar foot 26.5  Circumference of ankle/heel 33.2  5 cm proximal to 1st MTP joint 25  Across MTP joint 25.8  Around proximal great toe   (Blank rows = not tested)   TODAY'S TREATMENT  Measurement, Review of LE exercises,                      Manual Therapy    Manual Therapy Manual Lymphatic Drainage (MLD);Compression Bandaging     Manual therapy comments done seperate from all other aspects of treatment     Manual Lymphatic Drainage (MLD) To include supraclavicular, deep and superfical abdominal, B inguinal axillary anastomosis as well as anterior and posterior RT LE ; posterior completed semiprone.     Compression Bandaging Foam and multilayer short stretch bandages from foot to knee of RT LE    PATIENT EDUCATION:  Education details: see above  Person educated: Patient Education method: Explanation, Verbal cues, and Handouts Education comprehension: verbalized understanding   HOME EXERCISE PROGRAM: Ankle pumps, LAQ, hip ab/adduction, marching, diaphragm breathing, lymph  squeeze   ASSESSMENT:  CLINICAL IMPRESSION: Patient is a 41 y.o.  f who was seen today for physical therapy evaluation and treatment for lymphedema. She has noted induration of her Rt LE especially around the ankle.  She will benefit from total decongestive therapy of the Rt LE.  The Lt LE has edema as well, however at this point I feel manual and compression garment will be sufficient for her Lt LE.  [name] Barnett will benefit from a compression pump, however, since she is residing at a facility they will be responsible for obtaining the pump.     OBJECTIVE IMPAIRMENTS decreased ROM and increased edema.   ACTIVITY LIMITATIONS  mobility  .   PERSONAL FACTORS 1-2 comorbidities: CVA, sacral decubiti,   are also affecting patient's functional outcome.    REHAB POTENTIAL: Good  CLINICAL DECISION MAKING: Evolving/moderate complexity  EVALUATION COMPLEXITY: Low  GOALS: Goals reviewed with patient? Yes  SHORT TERM GOALS:  PT to have lost 2 cm volume in Rt LE to decrease risk of cellulitis. Baseline: see above  Target date: 07/27/2021 Goal status: INITIAL  2.  PT to have 2 minute walk test completed  Baseline: none Target date: 07/27/2021 Goal status: INITIAL  LONG TERM GOALS:  PT to have lost 4 cm where appropriate from Rt LE measurement  Baseline: see above  Target date: 08/10/2021 Goal status: INITIAL  2.  Pt to be completing self manual to trunk and thighs  Baseline: not completing manual  Target date: 08/10/2021 Goal status: INITIAL  3.  PT to be completing LE exercises on a daily basis  Baseline: not completing daily Target date: 08/10/2021 Goal status: INITIAL  4.  Pt facility to have purchased and be donning compression garment.  Baseline: pt has TED hose Target date: 08/10/2021 Goal status: INITIAL  5.  Pt to be able to ambulate distance + 100 feet of initial 2 minute walk test  Baseline: to be determined  Target date: 08/10/2021 Goal status: INITIAL  PLAN: PT FREQUENCY: 3x/week  PT DURATION: 6 weeks  PLANNED INTERVENTIONS:  Patient/Family education, Manual lymph drainage, and Compression bandaging  PLAN FOR NEXT SESSION: Complete 2 minute walk test with rolling walker, Continue total decongestive manual techniques, Ambulate pt to treatment  Room each session.  Therapist gave sheet with measurement and where to obtain compression garment for pt to give to facility.  Ensure pt gave this to the facility.  Print evaluation and give [facility] sheet to pt to give to facility as they will be responsible for this purchase.   Rayetta Humphrey, PT CLT [phone]  07/13/2021, 3:17 PM

## 2021-07-13 NOTE — Telephone Encounter (Signed)
[facility] CALLED AND STATED THAT THEY WANT TO KNOW HOW LONG THE PT IS TO TAKE THE MEDICINE THAT WAS PRESCRIBED.  Alicia Amel HILL @ [phone]. ?

## 2021-07-13 NOTE — Telephone Encounter (Signed)
I spoke with [name] Barnett @ [place]. Pt's swab was + for yeast. She can use OTC boric acid supp. [name] Barnett voiced understanding. JSY ?

## 2021-07-13 NOTE — Telephone Encounter (Signed)
I called [place] but couldn't get her nurse on the line @ 5:18 pm. Will try again tomorrow. JSY ?

## 2021-07-14 NOTE — Telephone Encounter (Signed)
No one answered @ [place] so I called pt letting her know she needs to take the boric acid supp for 7-10 nights per [place] ?

## 2021-07-15 ENCOUNTER — Ambulatory Visit (INDEPENDENT_AMBULATORY_CARE_PROVIDER_SITE_OTHER): Payer: Medicare Other | Admitting: Urology

## 2021-07-15 ENCOUNTER — Ambulatory Visit (HOSPITAL_COMMUNITY): Payer: Medicare Other

## 2021-07-15 ENCOUNTER — Encounter: Payer: Self-pay | Admitting: Urology

## 2021-07-15 ENCOUNTER — Encounter (HOSPITAL_COMMUNITY): Payer: Self-pay

## 2021-07-15 ENCOUNTER — Other Ambulatory Visit: Payer: Self-pay

## 2021-07-15 ENCOUNTER — Encounter (HOSPITAL_BASED_OUTPATIENT_CLINIC_OR_DEPARTMENT_OTHER): Payer: Medicare Other | Admitting: General Surgery

## 2021-07-15 VITALS — BP 157/97 | HR 91

## 2021-07-15 DIAGNOSIS — N1330 Unspecified hydronephrosis: Secondary | ICD-10-CM

## 2021-07-15 DIAGNOSIS — N200 Calculus of kidney: Secondary | ICD-10-CM

## 2021-07-15 DIAGNOSIS — I890 Lymphedema, not elsewhere classified: Secondary | ICD-10-CM

## 2021-07-15 DIAGNOSIS — L89322 Pressure ulcer of left buttock, stage 2: Secondary | ICD-10-CM | POA: Diagnosis not present

## 2021-07-15 NOTE — Therapy (Signed)
?OUTPATIENT PHYSICAL THERAPY TREATMENT NOTE ? ? ?Patient Name: [name] Barnett ?MRN: [id number] ?DOB:06-Dec-1980, 41 y.o., female ?Today's Date: 07/15/2021 ? ?PCP: Julianne Handler, DO ?REFERRING PROVIDER: Sitafalwalla, Sharmin, * ? ? PT End of Session - 07/15/21 1420   ? ? Visit Number 2   ? Number of Visits 12   ? Date for PT Re-Evaluation 08/12/21   ? Authorization Type medicare/medicaid 80/20   ? Progress Note Due on Visit 10   ? PT Start Time 1318   ? PT Stop Time 1400   ? PT Time Calculation (min) 42 min   ? Activity Tolerance Patient tolerated treatment well;Patient limited by fatigue   ? Behavior During Therapy [facility] for tasks assessed/performed   ? ?  ?  ? ?  ? ? ?Past Medical History:  ?Diagnosis Date  ? Diabetes mellitus without complication ([place])   ? Hyperlipidemia   ? Hypertension   ? Renal disorder   ? Stroke [facility])   ? Vitamin D deficiency   ? ?Past Surgical History:  ?Procedure Laterality Date  ? cardiac stents    ? CATARACT EXTRACTION W/PHACO Left 08/29/2020  ? Procedure: CATARACT EXTRACTION PHACO AND INTRAOCULAR LENS PLACEMENT LEFT EYE;  Surgeon: Baruch Goldmann, MD;  Location: [facility];  Service: Ophthalmology;  Laterality: Left;  left ?CDE=29.30  ? CATARACT EXTRACTION W/PHACO Right 11/28/2020  ? Procedure: CATARACT EXTRACTION PHACO AND INTRAOCULAR LENS PLACEMENT (IOC);  Surgeon: Baruch Goldmann, MD;  Location: [facility];  Service: Ophthalmology;  Laterality: Right;  CDE 4.35  ? COLOSTOMY    ? TUBAL LIGATION    ? wound on buttocks    ? ?Patient Active Problem List  ? Diagnosis Date Noted  ? Chronic kidney disease (CKD) stage G5/A1, glomerular filtration rate (GFR) less than or equal to 15 mL/min/1.73 square meter and albuminuria creatinine ratio less than 30 mg/g (HCC) 06/22/2021  ? Dialysis patient [facility]) 06/22/2021  ? BV (bacterial vaginosis) 04/02/2020  ? Vaginal itching 04/02/2020  ? Vaginal odor 04/02/2020  ? Vaginal discharge 04/02/2020  ? Sepsis secondary to UTI ([place]) 01/13/2020  ? Acute  respiratory failure with hypoxia ([place]) 01/13/2020  ? Right leg swelling 01/13/2020  ? Sacral decubitus ulcer 01/13/2020  ? Anemia due to end stage renal disease ([place]) 01/13/2020  ? Essential hypertension 01/13/2020  ? Hyperlipidemia 01/13/2020  ? Type 2 diabetes mellitus ([place]) 01/13/2020  ? ? ?REFERRING DIAG: Lymphedema ? ?THERAPY DIAG:  ?Lymphedema, not elsewhere classified ? ?PERTINENT HISTORY: Lymphedema  ? ?PRECAUTIONS: Other: cellulitis   ? ?SUBJECTIVE: Pt stated she removed the dressings last night to shower, noticed some reduction in leg.  Reoprts she has began her HEP without question.  Does c/o LBP pain scale 7/10 achey sore pain. ? ?PAIN:  ?Are you having pain?  ? ? ? ?TODAY'S TREATMENT:  ? ? 07/15/21 0001  ?Ambulation/Gait  ?Ambulation/Gait Yes  ?Ambulation/Gait Assistance 4: Min guard  ?Ambulation Distance (Feet) 14 Feet  ?Assistive device Rolling walker  ?Gait Pattern Step-to pattern;Decreased step length - right;Decreased step length - left;Decreased stride length;Decreased hip/knee flexion - right;Decreased hip/knee flexion - left;Decreased weight shift to right;Right flexed knee in stance;Left flexed knee in stance;Antalgic  ?Manual Therapy  ?Manual Therapy Manual Lymphatic Drainage (MLD);Compression Bandaging  ?Manual therapy comments done seperate from all other aspects of treatment  ?Manual Lymphatic Drainage (MLD) To include supraclavicular, deep and superfical abdominal, Rt inguinal axillary and intrainguinal anastomosis as well as anterior and posterior RT LE ; posterior completed semiprone.  ?Compression Bandaging Foam and  multilayer short stretch bandages from foot to knee of RT LE  ?Other Manual Therapy [facility] paperwork printed and given to pt for facility to purchase pump  ? ? ? ?PATIENT EDUCATION: ?Education details: Reviewed importance of HEP, bandage wear time, given paperwork for facility to purchase pump ?Person educated: Patient ?Education method: Explanation ?Education  comprehension: verbalized understanding ? ? ?HOME EXERCISE PROGRAM: ?Ankle pumps, LAQ, hip ab/adduction, marching, diaphragm breathing, lymph squeeze   ? ? PT Short Term Goals - 04/03/21 1537   ? ?  ? PT SHORT TERM GOAL #1  ? Title PT to be able to vebalize the four aspects to control lymphedema.   ? Status On-going   ? Target Date 04/15/21   ? ?  ?  ? ?  ? ? ? PT Long Term Goals - 04/03/21 1537   ? ?  ? PT LONG TERM GOAL #1  ? Title PT LE measurements to be decreased 2-4 cm to reduce risk of cellulitis   ? Time 4   ? Period Weeks   ? Status On-going   ? ?  ?  ? ?  ? ? ? Plan - 07/15/21 1420   ? ? Clinical Impression Statement Began session with 2MWT, able to complete 88ft prior fatigue with RW and min guard for safety, pt presents with hip and knee flexion, unable to stand erect. Manual decongestive techniques complete to address edema present distal Rt LE.  Application of multilayer short stretch bandages with 1/2in foam to distal Rt LE foot to knee.  Pt given [facility] paperwork with instructed for facility to purchase pump.   ? Personal Factors and Comorbidities Comorbidity 3+;Fitness   ? Comorbidities DM, renal disorder, CVA,   ? Examination-Activity Limitations Locomotion Level   ? Examination-Participation Restrictions Other   ? Stability/Clinical Decision Making Evolving/Moderate complexity   ? Clinical Decision Making Moderate   ? PT Frequency 3x / week   ? PT Duration 4 weeks   ? PT Treatment/Interventions Patient/family education;Manual techniques;Manual lymph drainage   ? PT Next Visit Plan Gait training with RW every session.  F/U on pt giving facility paperwork for compressoin garments and [employer], anwer questions as needed.   ? PT Home Exercise Plan ankle pumps, LAQ, hip ab/adduction, marching, diaphragmic breath and lymph squeeze all x 10 rep B   ? Consulted and Agree with Plan of Care Patient   ? ?  ?  ? ?  ? ? ? ?Ihor Austin, LPTA/CLT; CBIS ?[phone] ? ?Aldona Lento,  PTA ?07/15/2021, 2:50 PM ? ?  ? ? ?

## 2021-07-15 NOTE — Progress Notes (Signed)
[place], [place] ([id number]) ?Visit Report for 07/15/2021 ?Arrival Information Details ?Patient Name: Date of Service: ?[name] Barnett, [name] Barnett 07/15/2021 7:45 A M ?Medical Record Number: [id number] ?Patient Account Number: [id number] ?Date of Birth/Sex: Treating RN: ?08/14/1980 (40 y.o. F) [place], Mechele Claude ?Primary Care Jamelia Varano: SITA FA LWA LLA, SHA RMIN Other Clinician: ?Referring Kaydyn Chism: ?Treating Cynthya Yam/Extender: Fredirick Maudlin ?SITA FA LWA LLA, SHA RMIN ?Weeks in Treatment: 28 ?Visit Information History Since Last Visit ?Added or deleted any medications: No ?Patient Arrived: Wheel Chair ?Any new allergies or adverse reactions: No ?Arrival Time: 07:57 ?Had a fall or experienced change in No ?Accompanied By: self ?activities of daily living that may affect ?Transfer Assistance: None ?risk of falls: ?Patient Identification Verified: Yes ?Signs or symptoms of abuse/neglect since last visito No ?Patient Requires Transmission-Based Precautions: No ?Hospitalized since last visit: No ?Patient Has Alerts: No ?Implantable device outside of the clinic excluding No ?cellular tissue based products placed in the center ?since last visit: ?Has Dressing in Place as Prescribed: Yes ?Pain Present Now: No ?Electronic Signature(s) ?Signed: 07/15/2021 9:07:04 AM By: Dellie Catholic RN ?Entered By: Dellie Catholic on 07/15/2021 07:58:56 ?-------------------------------------------------------------------------------- ?Encounter Discharge Information Details ?Patient Name: Date of Service: ?[name] Barnett, [name] Barnett 07/15/2021 7:45 A M ?Medical Record Number: [id number] ?Patient Account Number: [id number] ?Date of Birth/Sex: Treating RN: ?1980/11/09 (40 y.o. F) Scotton, Mechele Claude ?Primary Care Tillman Kazmierski: SITA FA LWA LLA, SHA RMIN Other Clinician: ?Referring Liddy Deam: ?Treating Elnoria Livingston/Extender: Fredirick Maudlin ?SITA FA LWA LLA, SHA RMIN ?Weeks in Treatment: 28 ?Encounter Discharge Information Items Post Procedure Vitals ?Discharge  Condition: Stable ?Temperature (F): 98.9 ?Ambulatory Status: Wheelchair ?Pulse (bpm): 94 ?Discharge Destination: [place] ?Respiratory Rate (breaths/min): 18 ?Telephoned: No ?Blood Pressure (mmHg): 152/87 ?Orders Sent: Yes ?Transportation: Other ?Accompanied By: self ?Schedule Follow-up Appointment: Yes ?Clinical Summary of Care: Patient Declined ?Electronic Signature(s) ?Signed: 07/15/2021 9:07:04 AM By: Dellie Catholic RN ?Entered By: Dellie Catholic on 07/15/2021 08:38:29 ?-------------------------------------------------------------------------------- ?Lower Extremity Assessment Details ?Patient Name: ?Date of Service: ?[name] Barnett, [name] Barnett 07/15/2021 7:45 A M ?Medical Record Number: [id number] ?Patient Account Number: [id number] ?Date of Birth/Sex: ?Treating RN: ?23-Jul-1980 (41 y.o. F) [place]ton, Mechele Claude ?Primary Care Zackarie Chason: SITA FA LWA LLA, SHA RMIN ?Other Clinician: ?Referring Gaetan Spieker: ?Treating Jaysen Wey/Extender: Fredirick Maudlin ?SITA FA LWA LLA, SHA RMIN ?Weeks in Treatment: 28 ?Edema Assessment ?Assessed: [Left: No] [Right: No] ?E[Left: dema] [Right: :] ?Calf ?Left: Right: ?Point of Measurement: From Medial Instep 37 cm ?Ankle ?Left: Right: ?Point of Measurement: From Medial Instep 26 cm ?Electronic Signature(s) ?Signed: 07/15/2021 9:07:04 AM By: Dellie Catholic RN ?Entered By: Dellie Catholic on 07/15/2021 08:10:59 ?-------------------------------------------------------------------------------- ?Multi Wound Chart Details ?Patient Name: ?Date of Service: ?[name] Barnett, [name] Barnett 07/15/2021 7:45 A M ?Medical Record Number: [id number] ?Patient Account Number: [id number] ?Date of Birth/Sex: ?Treating RN: ?1981-01-26 (41 y.o. F) ?Primary Care Stassi Fadely: SITA FA LWA LLA, SHA RMIN ?Other Clinician: ?Referring Monta Police: ?Treating Evangelene Vora/Extender: Fredirick Maudlin ?SITA FA LWA LLA, SHA RMIN ?Weeks in Treatment: 28 ?Vital Signs ?Height(in): ?Pulse(bpm): 94 ?Weight(lbs): ?Blood Pressure(mmHg):  152/87 ?Body Mass Index(BMI): ?Temperature(??F): 98.9 ?Respiratory Rate(breaths/min): 18 ?Photos: [7:No Photos] ?Perineum Left T Fourth ?oe Left T Fourth ?oe ?Wound Location: ?Pressure Injury Trauma Trauma ?Wounding Event: ?Pressure Ulcer Trauma, Other Trauma, Other ?Primary Etiology: ?Cataracts, Glaucoma, Anemia, Cataracts, Glaucoma, Anemia, Cataracts, Glaucoma, Anemia, ?Comorbid History: ?Congestive Heart Failure, Congestive Heart Failure, Congestive Heart Failure, ?Hypertension, Type II Diabetes, End Hypertension, Type II Diabetes, End Hypertension, Type II Diabetes, End ?Stage Renal Disease, Neuropathy, Stage Renal Disease, Neuropathy, Stage Renal Disease, Neuropathy, ?Confinement Anxiety Confinement  Anxiety Confinement Anxiety ?02/04/2021 07/08/2021 07/08/2021 ?Date Acquired: ?23 1 1  ?Weeks of Treatment: ?Open Open Open ?Wound Status: ?No No No ?Wound Recurrence: ?No Yes Yes ?Clustered Wound: ?1.5x0.4x0.3 0.4x1.8x0.3 0.4x1.8x0.3 ?Measurements L x W x D (cm) ?0.471 0.565 0.565 ?A (cm?) : ?rea ?0.141 0.17 0.17 ?Volume (cm?) : ?73.20% 0.00% 0.00% ?% Reduction in A rea: ?59.90% 0.00% 0.00% ?% Reduction in Volume: ?Category/Stage II Full Thickness Without Exposed Full Thickness Without Exposed ?Classification: ?Support Structures Support Structures ?Medium Medium Medium ?Exudate A mount: ?Serosanguineous Sanguinous Sanguinous ?Exudate Type: ?red, brown red red ?Exudate Color: ?Flat and Intact Well defined, not attached N/A ?Wound Margin: ?Medium (34-66%) Small (1-33%) N/A ?Granulation A mount: ?Red, Pink Red N/A ?Granulation Quality: ?Medium (34-66%) Large (67-100%) N/A ?Necrotic A mount: ?Fat Layer (Subcutaneous Tissue): Yes Fat Layer (Subcutaneous Tissue): Yes N/A ?Exposed Structures: ?Fascia: No ?Fascia: No ?Tendon: No ?Tendon: No ?Muscle: No ?Muscle: No ?Joint: No ?Joint: No ?Bone: No ?Bone: No ?Small (1-33%) None N/A ?Epithelialization: ?N/A Debridement - Excisional Debridement -  Excisional ?Debridement: ?Pre-procedure Verification/Time Out N/A 08:26 08:26 ?Taken: ?N/A Other Other ?Pain Control: ?N/A Subcutaneous, Slough Subcutaneous, Slough ?Tissue Debrided: ?N/A Skin/Subcutaneous Tissue Skin/Subcutaneous Tissue ?Level: ?N/A 0.72 0.72 ?Debridement A (sq cm): ?rea ?N/A Curette Curette ?Instrument: ?N/A Minimum Minimum ?Bleeding: ?N/A Pressure Pressure ?Hemostasis A chieved: ?N/A 0 0 ?Procedural Pain: ?N/A 0 0 ?Post Procedural Pain: ?N/A Procedure was tolerated well Procedure was tolerated well ?Debridement Treatment Response: ?N/A 0.4x1.8x0.3 0.4x1.8x0.3 ?Post Debridement Measurements L x ?W x D (cm) ?N/A 0.17 0.17 ?Post Debridement Volume: (cm?) ?N/A N/A N/A ?Post Debridement Stage: ?N/A Debridement Debridement ?Procedures Performed: ?Wound Number: 8 N/A N/A ?Photos: N/A N/A ?Sacrum N/A N/A ?Wound Location: ?Pressure Injury N/A N/A ?Wounding Event: ?Pressure Ulcer N/A N/A ?Primary Etiology: ?Cataracts, Glaucoma, Anemia, N/A N/A ?Comorbid History: ?Congestive Heart Failure, ?Hypertension, Type II Diabetes, End ?Stage Renal Disease, Neuropathy, ?Confinement Anxiety ?07/15/2021 N/A N/A ?Date Acquired: ?0 N/A N/A ?Weeks of Treatment: ?Open N/A N/A ?Wound Status: ?No N/A N/A ?Wound Recurrence: ?No N/A N/A ?Clustered Wound: ?1.8x0.3x0.2 N/A N/A ?Measurements L x W x D (cm) ?0.424 N/A N/A ?A (cm?) : ?rea ?0.085 N/A N/A ?Volume (cm?) : ?0.00% N/A N/A ?% Reduction in A rea: ?0.00% N/A N/A ?% Reduction in Volume: ?Category/Stage II N/A N/A ?Classification: ?Medium N/A N/A ?Exudate A mount: ?Serosanguineous N/A N/A ?Exudate Type: ?red, brown N/A N/A ?Exudate Color: ?Distinct, outline attached N/A N/A ?Wound Margin: ?Medium (34-66%) N/A N/A ?Granulation A mount: ?Red N/A N/A ?Granulation Quality: ?Medium (34-66%) N/A N/A ?Necrotic A mount: ?Fat Layer (Subcutaneous Tissue): Yes N/A N/A ?Exposed Structures: ?Fascia: No ?Tendon: No ?Muscle: No ?Joint: No ?Bone: No ?None N/A  N/A ?Epithelialization: ?Debridement - Excisional N/A N/A ?Debridement: ?Pre-procedure Verification/Time Out 08:26 N/A N/A ?Taken: ?Other N/A N/A ?Pain Control: ?Subcutaneous, Slough N/A N/A ?Tissue Debrided: ?Skin/Subcutaneous Tissue N/A N/A ?Level: ?0.54 N/A N/A ?Debridement A (s

## 2021-07-15 NOTE — Progress Notes (Signed)
[place], [place] ([id number]) ?Visit Report for 07/15/2021 ?Chief Complaint Document Details ?Patient Name: Date of Service: ?Barnett Barnett 07/15/2021 7:45 A M ?Medical Record Number: [id number] ?Patient Account Number: [id number] ?Date of Birth/Sex: Treating RN: ?1980-10-28 (40 y.o. F) ?Primary Care Provider: SITA FA LWA LLA, SHA RMIN Other Clinician: ?Referring Provider: ?Treating Provider/Extender: Barnett Barnett ?SITA FA LWA LLA, SHA RMIN ?Weeks in Treatment: 28 ?Information Obtained from: Patient ?Chief Complaint ?06/15/2019; patient is here for review of the wound on her lower sacrum ?12/31/2020; patient is here for review of the wound on her right buttock ?Electronic Signature(s) ?Signed: 07/15/2021 8:31:34 AM By: Barnett Maudlin MD FACS ?Entered By: Barnett Barnett on 07/15/2021 08:31:34 ?-------------------------------------------------------------------------------- ?Debridement Details ?Patient Name: Date of Service: ?Barnett Barnett 07/15/2021 7:45 A M ?Medical Record Number: [id number] ?Patient Account Number: [id number] ?Date of Birth/Sex: Treating RN: ?Oct 11, 1980 (40 y.o. F) Barnett Barnett ?Primary Care Provider: SITA FA LWA LLA, SHA RMIN Other Clinician: ?Referring Provider: ?Treating Provider/Extender: Barnett Barnett ?SITA FA LWA LLA, SHA RMIN ?Weeks in Treatment: 28 ?Debridement Performed for Assessment: Wound #7 Left T Fourth ?oe ?Performed By: Physician Barnett Maudlin, MD ?Debridement Type: Debridement ?Level of Consciousness (Pre-procedure): Awake and Alert ?Pre-procedure Verification/Time Out Yes - 08:26 ?Taken: ?Start Time: 08:26 ?Pain Control: ?Other : Benzocaine ?T Area Debrided (L x W): ?otal 0.4 (cm) x 1.8 (cm) = 0.72 (cm?) ?Tissue and other material debrided: Non-Viable, Slough, Subcutaneous, [place] ?Level: Skin/Subcutaneous Tissue ?Debridement Description: Excisional ?Instrument: Curette ?Bleeding: Minimum ?Hemostasis Achieved: Pressure ?End Time: 08:28 ?Procedural  Pain: 0 ?Post Procedural Pain: 0 ?Response to Treatment: Procedure was tolerated well ?Level of Consciousness (Post- Awake and Alert ?procedure): ?Post Debridement Measurements of Total Wound ?Length: (cm) 0.4 ?Width: (cm) 1.8 ?Depth: (cm) 0.3 ?Volume: (cm?) 0.17 ?Character of Wound/Ulcer Post Debridement: Improved ?Post Procedure Diagnosis ?Same as Pre-procedure ?Electronic Signature(s) ?Signed: 07/15/2021 9:07:04 AM By: Dellie Catholic RN ?Signed: 07/15/2021 6:42:44 PM By: Barnett Maudlin MD FACS ?Entered By: Dellie Catholic on 07/15/2021 08:29:40 ?-------------------------------------------------------------------------------- ?Debridement Details ?Patient Name: ?Date of Service: ?Barnett Barnett 07/15/2021 7:45 A M ?Medical Record Number: [id number] ?Patient Account Number: [id number] ?Date of Birth/Sex: ?Treating RN: ?[birth year] (41 y.o. F) Barnett Barnett ?Primary Care Provider: SITA FA LWA LLA, SHA RMIN ?Other Clinician: ?Referring Provider: ?Treating Provider/Extender: Barnett Barnett ?SITA FA LWA LLA, SHA RMIN ?Weeks in Treatment: 28 ?Debridement Performed for Assessment: Wound #8 Sacrum ?Performed By: Physician Barnett Maudlin, MD ?Debridement Type: Debridement ?Level of Consciousness (Pre-procedure): Awake and Alert ?Pre-procedure Verification/Time Out Yes - 08:26 ?Taken: ?Start Time: 08:26 ?Pain Control: ?Other : Benzocaine ?T Area Debrided (L x W): ?otal 1.8 (cm) x 0.3 (cm) = 0.54 (cm?) ?Tissue and other material debrided: Non-Viable, Slough, Subcutaneous, [place] ?Level: Skin/Subcutaneous Tissue ?Debridement Description: Excisional ?Instrument: Curette ?Bleeding: Minimum ?Hemostasis Achieved: Pressure ?End Time: 08:28 ?Procedural Pain: 0 ?Post Procedural Pain: 0 ?Response to Treatment: Procedure was tolerated well ?Level of Consciousness (Post- Awake and Alert ?procedure): ?Post Debridement Measurements of Total Wound ?Length: (cm) 1.8 ?Stage: Category/Stage II ?Width: (cm) 0.3 ?Depth: (cm)  0.2 ?Volume: (cm?) 0.085 ?Character of Wound/Ulcer Post Debridement: Improved ?Post Procedure Diagnosis ?Same as Pre-procedure ?Electronic Signature(s) ?Signed: 07/15/2021 9:07:04 AM By: Dellie Catholic RN ?Signed: 07/15/2021 6:42:44 PM By: Barnett Maudlin MD FACS ?Entered By: Dellie Catholic on 07/15/2021 08:30:27 ?-------------------------------------------------------------------------------- ?HPI Details ?Patient Name: ?Date of Service: ?Barnett Barnett 07/15/2021 7:45 A M ?Medical Record Number: [id number] ?Patient Account Number: [id number] ?Date of Birth/Sex: Treating RN: ?June 28, 1980 (40 y.o. F) ?Primary Care Provider: Other  Clinician: ?SITA FA LWA LLA, SHA RMIN ?Referring Provider: ?Treating Provider/Extender: Barnett Barnett ?SITA FA LWA LLA, SHA RMIN ?Weeks in Treatment: 28 ?History of Present Illness ?HPI Description: Admission ?06/15/2019 ?This is a 41 year old woman who comes to [place] from [facility] skilled facility in [place]. I am able to follow a lot of her history on care ?everywhere in epic. The patient is a type II diabetic and has been on dialysis. 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 I am not exactly sure of the nature of the stroke at this present time. She has made a decent recovery and is now up walking with a ?walker. ?In August 2020 she was admitted to [facility] in [place] with L4-L5 discitis. Culture and sensitivity showed Klebsiella. She was discharged on 8 ?weeks of ceftazidime. In mid September she is readmitted to the hospital with a pressure ulcer on her lower sacrum and necrotizing fasciitis. She required an ?extensive surgical debridement. CT scan of the pelvis at 1 point showed communication with the rectum. A colostomy was recommended and placed. Culture ?of this area showed Klebsiella and yeast. She was discharged on an extended course of meropenem and Diflucan. She was discharged  to [facility] specialty [facility]hospitals in [place]. We do not have any of these records. I think she was discharged to another nursing home but is come to [facility] skilled facility ?in [facility]. She was sent down here for review of the remaining wound. By review of the pictures that the patient had from September this is ?made considerable improvement. She tells me that the wound VAC was discontinued at the end of December 2020. Currently the facility is using a form of ?silver alginate to the wound surface. 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 CVA with left hemiparesis in June 2020 but she ?appears to be making a good recovery. Chronic diastolic heart failure, hypertension ?2/19; patient's wound area slightly smaller. The area that was towards her anus has closed over. We are using Santyl covered with Hydrofera Blue. We have ?not been able to get her lab work apparently her veins are very difficult for phlebotomy. They are trying to get the blood work done where she dialyzes in [place] but that requires some logistical issues. ?2/26; not much change in the wound. Still a nonviable surface we have been using Santyl and Hydrofera Blue. I changed her to [facility] only. She tells ?me she has rigorously offloading this area she also complains of some pain ?3/5; still no change here. I switch to pure Hydrofera Blue. She still complains of a lot of pain although is a big wound it seems out of proportion to what I might ?expect. This was initially a surgical wound secondary to necrotizing wound infection. She is on dialysis. Other than that she thinks she is offloading this fairly ?rigorously. She has not been systemically unwell ?3/26; I switched to Iodoflex 3 weeks ago. This really seems to have helped, much better surface and slight improvement in the surface area. ?4/26; the patient is still on Iodoflex she missed an  appointment 2 weeks ago. Doing slightly smaller. She is on dialysis ?5/10; using Hydrofera Blue. The major improvement here is the complete lack of depth. She has islands of epithelialization ?09/24/19-Patient co

## 2021-07-15 NOTE — Progress Notes (Signed)
? ?07/15/2021 ?10:48 AM  ? ?[place] ?[birth year] ?[id number] ? ?Referring provider: Julianne Handler, DO ?[place] ?[place],  [place] [address] ? ?Followup nephrolithiasis ? ? ?HPI: ?[name] is a 41yo here for followup for nephrolithiasis. NO stone events since last visit. Renal US from 07/03/2021 shows no renal calculi and no hydronephrosis. She has urinary frequency every 4-5 hours. She has nocturia 1x. Urine stream strong. She was treated for a UTI 2-3 weeks ago. No hematuria or dysuria. No other complaints today.  ? ? ?PMH: ?Past Medical History:  ?Diagnosis Date  ? Diabetes mellitus without complication ([place])   ? Hyperlipidemia   ? Hypertension   ? Renal disorder   ? Stroke [facility])   ? Vitamin D deficiency   ? ? ?Surgical History: ?Past Surgical History:  ?Procedure Laterality Date  ? cardiac stents    ? CATARACT EXTRACTION W/PHACO Left 08/29/2020  ? Procedure: CATARACT EXTRACTION PHACO AND INTRAOCULAR LENS PLACEMENT LEFT EYE;  Surgeon: Baruch Goldmann, MD;  Location: [facility];  Service: Ophthalmology;  Laterality: Left;  left ?CDE=29.30  ? CATARACT EXTRACTION W/PHACO Right 11/28/2020  ? Procedure: CATARACT EXTRACTION PHACO AND INTRAOCULAR LENS PLACEMENT (IOC);  Surgeon: Baruch Goldmann, MD;  Location: [facility];  Service: Ophthalmology;  Laterality: Right;  CDE 4.35  ? COLOSTOMY    ? TUBAL LIGATION    ? wound on buttocks    ? ? ?Home Medications:  ?Allergies as of 07/15/2021   ? ?   Reactions  ? Corn-containing Products   ? Per MAR  ? Lisinopril   ? Per MAR  ? Morphine And Related   ? Per MAR  ? ?  ? ?  ?Medication List  ?  ? ?  ? Accurate as of July 15, 2021 10:48 AM. If you have any questions, ask your nurse or doctor.  ?  ?  ? ?  ? ?acetaminophen 325 MG tablet ?Commonly known as: TYLENOL ?Take 650 mg by mouth every 6 (six) hours as needed. ?  ?amLODipine 5 MG tablet ?Commonly known as: NORVASC ?Take 5 mg by mouth See admin instructions. Sun, Mon, Wed and Fri ?  ?ascorbic acid 500 MG  tablet ?Commonly known as: VITAMIN C ?Take 500 mg by mouth 2 (two) times daily. ?  ?aspirin EC 81 MG tablet ?Take 81 mg by mouth daily. Swallow whole. ?  ?atenolol 25 MG tablet ?Commonly known as: TENORMIN ?Take 0.5 tablets (12.5 mg total) by mouth See admin instructions. Every Sun, Mon, Wed and Fri ?  ?atorvastatin 80 MG tablet ?Commonly known as: LIPITOR ?Take 1 tablet by mouth at bedtime. ?  ?b complex-vitamin c-folic acid 0.8 MG Tabs tablet ?Take 1 tablet by mouth daily. ?  ?Biotin 5 MG Tabs ?Take 5 mg by mouth in the morning and at bedtime. ?  ?Boric Acid Vaginal 600 MG Supp ?Place 1 suppository vaginally at bedtime. Place in vagina daily for 7 days ?  ?busPIRone 5 MG tablet ?Commonly known as: BUSPAR ?Take 5 mg by mouth 2 (two) times daily. ?  ?CERTAGEN PO ?Take 1 tablet by mouth in the morning. ?  ?cetaphil lotion ?Apply 1 application topically 2 (two) times daily as needed for dry skin. ?  ?clindamycin 1 % lotion ?Commonly known as: CLEOCIN T ?Apply topically. ?  ?desonide 0.05 % cream ?Commonly known as: DESOWEN ?SMARTSIG:2 Topical Twice Daily ?  ?dicyclomine 10 MG capsule ?Commonly known as: BENTYL ?Take 10 mg by mouth 3 (three) times daily before meals. ?  ?  fosfomycin 3 g Pack ?Commonly known as: MONUROL ?Take by mouth. ?  ?HumaLOG 100 UNIT/ML injection ?Generic drug: insulin lispro ?Inject 5 Units into the skin 3 (three) times daily before meals. ?  ?HYDROcodone-acetaminophen 5-325 MG tablet ?Commonly known as: NORCO/VICODIN ?Take 1 tablet by mouth 2 (two) times daily as needed. ?  ?ketoconazole 2 % cream ?Commonly known as: NIZORAL ?SMARTSIG:1 Topical Daily ?  ?Lantus SoloStar 100 UNIT/ML Solostar Pen ?Generic drug: insulin glargine ?Inject into the skin. ?  ?Lidocaine 4 % Ptch ?Apply 1 application topically daily at 6 (six) AM. ?  ?lidocaine-prilocaine cream ?Commonly known as: EMLA ?Apply 1 application topically daily as needed. Apply to HD graft site right arm topically one every tues, thurs, sat for  pain. ?  ?melatonin 3 MG Tabs tablet ?Take 3 mg by mouth at bedtime. ?  ?metroNIDAZOLE 0.75 % vaginal gel ?Commonly known as: METROGEL VAGINAL ?Use 1 applicator in vagina at bedtime for 5 nights then prn ?  ?nystatin cream ?Commonly known as: MYCOSTATIN ?Apply topically. ?  ?Nyamyc powder ?Generic drug: nystatin ?Apply topically. ?  ?ondansetron 4 MG disintegrating tablet ?Commonly known as: Zofran ODT ?Take 1 tablet (4 mg total) by mouth every 8 (eight) hours as needed for nausea or vomiting. ?  ?pantoprazole 40 MG tablet ?Commonly known as: PROTONIX ?Take 40 mg by mouth in the morning. ?  ?polyethylene glycol 17 g packet ?Commonly known as: MIRALAX / GLYCOLAX ?Take 17 g by mouth daily. ?  ?rOPINIRole 2 MG tablet ?Commonly known as: REQUIP ?Take 2 mg by mouth at bedtime. ?  ?rOPINIRole 0.5 MG tablet ?Commonly known as: REQUIP ?Take 0.5 mg by mouth at bedtime. ?  ?sennosides-docusate sodium 8.6-50 MG tablet ?Commonly known as: SENOKOT-S ?Take 2 tablets by mouth daily. ?  ?tamsulosin 0.4 MG Caps capsule ?Commonly known as: FLOMAX ?Take 1 capsule (0.4 mg total) by mouth daily. ?  ?thiamine 100 MG tablet ?Take 100 mg by mouth daily. ?  ?tiZANidine 4 MG tablet ?Commonly known as: ZANAFLEX ?Take 4 mg by mouth at bedtime. ?  ?traMADol 50 MG tablet ?Commonly known as: ULTRAM ?Take 100 mg by mouth every 6 (six) hours as needed. ?  ?Vitamin D 50 MCG (2000 UT) Caps ?Take 2,000 Units by mouth daily. ?  ? ?  ? ? ?Allergies:  ?Allergies  ?Allergen Reactions  ? Corn-Containing Products   ?  Per MAR  ? Lisinopril   ?  Per MAR  ? Morphine And Related   ?  Per MAR  ? ? ?Family History: ?Family History  ?Problem Relation Age of Onset  ? Cancer Paternal Grandmother   ? Heart disease Father   ? Hypertension Father   ? Diabetes Father   ? Glaucoma Father   ? Heart disease Mother   ? Hypertension Mother   ? Diabetes Mother   ? Diabetes Sister   ?     borderline  ? Hypertension Sister   ? ? ?Social History:  reports that she has never  smoked. She has never used smokeless tobacco. She reports that she does not drink alcohol and does not use drugs. ? ?ROS: ?All other review of systems were reviewed and are negative except what is noted above in HPI ? ?Physical Exam: ?BP (!) 157/97   Pulse 91   ?Constitutional:  Alert and oriented, No acute distress. ?HEENT: [place] AT, moist mucus membranes.  Trachea midline, no masses. ?Cardiovascular: No clubbing, cyanosis, or edema. ?Respiratory: Normal respiratory effort, no increased work  of breathing. ?GI: Abdomen is soft, nontender, nondistended, no abdominal masses ?GU: No CVA tenderness.  ?Lymph: No cervical or inguinal lymphadenopathy. ?Skin: No rashes, bruises or suspicious lesions. ?Neurologic: Grossly intact, no focal deficits, moving all 4 extremities. ?Psychiatric: Normal mood and affect. ? ?Laboratory Data: ?Lab Results  ?Component Value Date  ? WBC 7.1 07/02/2021  ? HGB 11.4 (A) 07/02/2021  ? HCT 34 (A) 07/02/2021  ? MCV 95.4 12/01/2020  ? PLT 183 07/02/2021  ? ? ?Lab Results  ?Component Value Date  ? CREATININE 7.4 (A) 07/02/2021  ? ? ?No results found for: PSA ? ?No results found for: TESTOSTERONE ? ?Lab Results  ?Component Value Date  ? HGBA1C 8.1 03/25/2021  ? ? ?Urinalysis ?   ?Component Value Date/Time  ? [place] (A) 12/01/2020 2301  ? APPEARANCEUR [place] (A) 12/08/2020 1108  ? LABSPEC 1.013 12/01/2020 2301  ? PHURINE 8.0 12/01/2020 2301  ? GLUCOSEU Negative 12/08/2020 1108  ? HGBUR MODERATE (A) 12/01/2020 2301  ? BILIRUBINUR Negative 12/08/2020 1108  ? [place] NEGATIVE 12/01/2020 2301  ? PROTEINUR 3+ (A) 12/08/2020 1108  ? PROTEINUR >=300 (A) 12/01/2020 2301  ? NITRITE Negative 12/08/2020 1108  ? NITRITE NEGATIVE 12/01/2020 2301  ? LEUKOCYTESUR 3+ (A) 12/08/2020 1108  ? LEUKOCYTESUR MODERATE (A) 12/01/2020 2301  ? ? ?Lab Results  ?Component Value Date  ? LABMICR See below: 12/08/2020  ? WBCUA >30 (A) 12/08/2020  ? LABEPIT >10 (A) 12/08/2020  ? MUCUS Present 12/08/2020  ? BACTERIA [place]  (A) 12/08/2020  ? ? ?Pertinent Imaging: ?Renal US 07/03/2021: Images reviewed and discussed with the patient  ?No results found for this or any previous visit. ? ?Results for orders placed during the hospital encount

## 2021-07-17 ENCOUNTER — Ambulatory Visit (HOSPITAL_COMMUNITY): Payer: Medicare Other

## 2021-07-17 ENCOUNTER — Telehealth (HOSPITAL_COMMUNITY): Payer: Self-pay

## 2021-07-17 NOTE — Telephone Encounter (Signed)
No show, called and spoke to pt who stated she did not have transportation today.  Reminded next apt date and time.  Was informed after call that facility had called earlier to cancel concerning this apt.   ? ?Ihor Austin, LPTA/CLT; CBIS ?[phone] ? ?

## 2021-07-18 LAB — TSH: TSH: 0.27 — AB (ref 0.41–5.90)

## 2021-07-20 ENCOUNTER — Telehealth (HOSPITAL_COMMUNITY): Payer: Self-pay | Admitting: Physical Therapy

## 2021-07-20 ENCOUNTER — Encounter (HOSPITAL_COMMUNITY): Payer: Medicare Other | Admitting: Physical Therapy

## 2021-07-20 NOTE — Telephone Encounter (Signed)
Pt did not show for appt.  Called personal number and left voice mail. In addition, called [facility] and was transferred to non-responsive line.  Therapist discontinued call and was unable to speak to anyone. ? ?Pt showed up 45 minutes past scheduled appointment time and was unable to be seen due to sceduling.  ? ?[name], PTA/CLT, WTA ?[phone] ? ?

## 2021-07-22 ENCOUNTER — Encounter (HOSPITAL_COMMUNITY): Payer: Medicare Other | Admitting: Physical Therapy

## 2021-07-24 ENCOUNTER — Ambulatory Visit (HOSPITAL_COMMUNITY): Payer: Medicare Other | Admitting: Physical Therapy

## 2021-07-24 ENCOUNTER — Other Ambulatory Visit (HOSPITAL_COMMUNITY): Payer: Self-pay | Admitting: Ophthalmology

## 2021-07-24 ENCOUNTER — Ambulatory Visit (INDEPENDENT_AMBULATORY_CARE_PROVIDER_SITE_OTHER): Payer: Medicare Other | Admitting: Internal Medicine

## 2021-07-24 ENCOUNTER — Encounter: Payer: Self-pay | Admitting: Internal Medicine

## 2021-07-24 ENCOUNTER — Ambulatory Visit: Payer: Medicare Other | Admitting: Internal Medicine

## 2021-07-24 ENCOUNTER — Encounter (HOSPITAL_BASED_OUTPATIENT_CLINIC_OR_DEPARTMENT_OTHER): Payer: Medicare Other | Admitting: General Surgery

## 2021-07-24 ENCOUNTER — Other Ambulatory Visit: Payer: Self-pay

## 2021-07-24 VITALS — BP 144/87 | HR 85 | Temp 99.1°F | Ht 65.0 in | Wt 200.0 lb

## 2021-07-24 DIAGNOSIS — S91309A Unspecified open wound, unspecified foot, initial encounter: Secondary | ICD-10-CM

## 2021-07-24 DIAGNOSIS — Z114 Encounter for screening for human immunodeficiency virus [HIV]: Secondary | ICD-10-CM | POA: Diagnosis not present

## 2021-07-24 DIAGNOSIS — A64 Unspecified sexually transmitted disease: Secondary | ICD-10-CM

## 2021-07-24 DIAGNOSIS — R262 Difficulty in walking, not elsewhere classified: Secondary | ICD-10-CM

## 2021-07-24 DIAGNOSIS — S31000D Unspecified open wound of lower back and pelvis without penetration into retroperitoneum, subsequent encounter: Secondary | ICD-10-CM

## 2021-07-24 DIAGNOSIS — I890 Lymphedema, not elsewhere classified: Secondary | ICD-10-CM

## 2021-07-24 DIAGNOSIS — G529 Cranial nerve disorder, unspecified: Secondary | ICD-10-CM

## 2021-07-24 DIAGNOSIS — Z202 Contact with and (suspected) exposure to infections with a predominantly sexual mode of transmission: Secondary | ICD-10-CM | POA: Diagnosis not present

## 2021-07-24 DIAGNOSIS — L89322 Pressure ulcer of left buttock, stage 2: Secondary | ICD-10-CM | POA: Diagnosis not present

## 2021-07-24 NOTE — Progress Notes (Signed)
? ?  ?Patient: [name] Barnett  ?DOB: [birth year] ?MRN: [id number] ?PCP: Julianne Handler, DO  ?  ?Patient Active Problem List  ? Diagnosis Date Noted  ? Chronic kidney disease (CKD) stage G5/A1, glomerular filtration rate (GFR) less than or equal to 15 mL/min/1.73 square meter and albuminuria creatinine ratio less than 30 mg/g (HCC) 06/22/2021  ? Dialysis patient [facility]) 06/22/2021  ? BV (bacterial vaginosis) 04/02/2020  ? Vaginal itching 04/02/2020  ? Vaginal odor 04/02/2020  ? Vaginal discharge 04/02/2020  ? Sepsis secondary to UTI ([place]) 01/13/2020  ? Acute respiratory failure with hypoxia ([place]) 01/13/2020  ? Right leg swelling 01/13/2020  ? Sacral decubitus ulcer 01/13/2020  ? Anemia due to end stage renal disease ([place]) 01/13/2020  ? Essential hypertension 01/13/2020  ? Hyperlipidemia 01/13/2020  ? Type 2 diabetes mellitus ([place]) 01/13/2020  ?  ? ?Subjective:  ?[name] Barnett is a 41 y.o. F with PMHX of ESRD on hD, DMII, HTN , HLD, anemia, GERD, CVA with left side affected,  L4-L5 discitis SP biopsy  Cx + klebsiella in August in 2020 on ceftazidime after HD  when in she was admitted on 01/10/19 for nec fascitis(sacral per pt)) taken to OR with Cx+ ESBL klebsiella and yeast treated with meropenem(to complete 8 weeks 02/12/19) and diflucan requiring  colostomy presents for persistent  wounds and necrotizing tissue passed from vagina. On 2/8/3 pt passed tissue mass from vagina. Tissue was sent to path which noted necrotic tissue.  ?Sacral wounds: ?-Followed by wound Center with 3/15 notes chronic pressure ulcer of perineum, new pressure ulcer at sacrum, sequela of toe amp LE 4th toe ?Passing of necrotizing tissue: ?Per Dr. Tessie Fass note on 07/01/21: ?-CT AP ordered then declined by insurance ?- Referred to OB/Gyn, pap smear was unrevealing ?06/10/21 Vaginal Bx+, foreign boy showed necrotic tissue, negative for dysplasia and malignancy. ?-wbc 7.1 on 07/01/21 ? ?-Seen by OB on 07/06/21(jennifer Laurann Montana, NP):  CV swab + BV , Rx metronidazole gel. Pelvic exam unrevealing.  ? ?Today: Pt reports she is here for Hx of bacteria "everywhere"(UTI/expolossive diarrhea/"just get sick everywhere). Although today, she is asymptomatic. Only passed necrotic tissue through vagina one. She reports she has not been sexually active for three year. Reports husband practiced MSM.  ?Review of Systems  ?All other systems reviewed and are negative. ? ?Past Medical History:  ?Diagnosis Date  ? Diabetes mellitus without complication ([place])   ? Hyperlipidemia   ? Hypertension   ? Renal disorder   ? Stroke [facility])   ? Vitamin D deficiency   ? ? ?Outpatient Medications Prior to Visit  ?Medication Sig Dispense Refill  ? acetaminophen (TYLENOL) 325 MG tablet Take 650 mg by mouth every 6 (six) hours as needed.    ? amLODipine (NORVASC) 5 MG tablet Take 5 mg by mouth See admin instructions. Sun, Mon, Wed and Fri    ? ascorbic acid (VITAMIN C) 500 MG tablet Take 500 mg by mouth 2 (two) times daily.    ? aspirin EC 81 MG tablet Take 81 mg by mouth daily. Swallow whole.    ? atenolol (TENORMIN) 25 MG tablet Take 0.5 tablets (12.5 mg total) by mouth See admin instructions. Every Sun, Mon, Wed and Fri    ? atorvastatin (LIPITOR) 80 MG tablet Take 1 tablet by mouth at bedtime.    ? b complex-vitamin c-folic acid (NEPHRO-VITE) 0.8 MG TABS tablet Take 1 tablet by mouth daily.    ? Biotin 5 MG TABS Take 5 mg  by mouth in the morning and at bedtime.    ? Boric Acid Vaginal 600 MG SUPP Place 1 suppository vaginally at bedtime. Place in vagina daily for 7 days 30 suppository 0  ? busPIRone (BUSPAR) 5 MG tablet Take 5 mg by mouth 2 (two) times daily.    ? cetaphil (CETAPHIL) lotion Apply 1 application topically 2 (two) times daily as needed for dry skin.    ? Cholecalciferol (VITAMIN D) 50 MCG (2000 UT) CAPS Take 2,000 Units by mouth daily.    ? clindamycin (CLEOCIN T) 1 % lotion Apply topically.    ? desonide (DESOWEN) 0.05 % cream SMARTSIG:2 Topical Twice Daily    ?  dicyclomine (BENTYL) 10 MG capsule Take 10 mg by mouth 3 (three) times daily before meals.    ? fosfomycin (MONUROL) 3 g PACK Take by mouth.    ? HUMALOG 100 UNIT/ML injection Inject 5 Units into the skin 3 (three) times daily before meals.    ? HYDROcodone-acetaminophen (NORCO/VICODIN) 5-325 MG tablet Take 1 tablet by mouth 2 (two) times daily as needed.    ? insulin glargine (LANTUS SOLOSTAR) 100 UNIT/ML Solostar Pen Inject into the skin.    ? ketoconazole (NIZORAL) 2 % cream SMARTSIG:1 Topical Daily    ? Lidocaine 4 % PTCH Apply 1 application topically daily at 6 (six) AM.    ? lidocaine-prilocaine (EMLA) cream Apply 1 application topically daily as needed. Apply to HD graft site right arm topically one every tues, thurs, sat for pain.    ? melatonin 3 MG TABS tablet Take 3 mg by mouth at bedtime.    ? metroNIDAZOLE (METROGEL VAGINAL) 0.75 % vaginal gel Use 1 applicator in vagina at bedtime for 5 nights then prn 70 g 1  ? Multiple Vitamins-Minerals (CERTAGEN PO) Take 1 tablet by mouth in the morning.    ? NYAMYC powder Apply topically.    ? nystatin cream (MYCOSTATIN) Apply topically.    ? ondansetron (ZOFRAN ODT) 4 MG disintegrating tablet Take 1 tablet (4 mg total) by mouth every 8 (eight) hours as needed for nausea or vomiting. 30 tablet 0  ? pantoprazole (PROTONIX) 40 MG tablet Take 40 mg by mouth in the morning.    ? polyethylene glycol (MIRALAX / GLYCOLAX) 17 g packet Take 17 g by mouth daily.    ? rOPINIRole (REQUIP) 0.5 MG tablet Take 0.5 mg by mouth at bedtime.    ? rOPINIRole (REQUIP) 2 MG tablet Take 2 mg by mouth at bedtime.    ? sennosides-docusate sodium (SENOKOT-S) 8.6-50 MG tablet Take 2 tablets by mouth daily.    ? tamsulosin (FLOMAX) 0.4 MG CAPS capsule Take 1 capsule (0.4 mg total) by mouth daily. 30 capsule 0  ? thiamine 100 MG tablet Take 100 mg by mouth daily.    ? tiZANidine (ZANAFLEX) 4 MG tablet Take 4 mg by mouth at bedtime.    ? traMADol (ULTRAM) 50 MG tablet Take 100 mg by mouth every  6 (six) hours as needed.    ? ?No facility-administered medications prior to visit.  ?  ? ?Allergies  ?Allergen Reactions  ? Corn-Containing Products   ?  Per MAR  ? Lisinopril   ?  Per MAR  ? Morphine And Related   ?  Per MAR  ? ? ?Social History  ? ?Tobacco Use  ? Smoking status: Never  ? Smokeless tobacco: Never  ?Vaping Use  ? Vaping Use: Never used  ?Substance Use Topics  ? Alcohol  use: Never  ? Drug use: Never  ? ? ?Family History  ?Problem Relation Age of Onset  ? Cancer Paternal Grandmother   ? Heart disease Father   ? Hypertension Father   ? Diabetes Father   ? Glaucoma Father   ? Heart disease Mother   ? Hypertension Mother   ? Diabetes Mother   ? Diabetes Sister   ?     borderline  ? Hypertension Sister   ? ? ?Objective:  ?There were no vitals filed for this visit. ?There is no height or weight on file to calculate BMI. ? ?Physical Exam ?Constitutional:   ?   Appearance: Normal appearance.  ?HENT:  ?   Head: Normocephalic and atraumatic.  ?   Right Ear: Tympanic membrane normal.  ?   Left Ear: Tympanic membrane normal.  ?   Nose: Nose normal.  ?   Mouth/Throat:  ?   Mouth: Mucous membranes are moist.  ?Eyes:  ?   Extraocular Movements: Extraocular movements intact.  ?   Conjunctiva/sclera: Conjunctivae normal.  ?   Pupils: Pupils are equal, round, and reactive to light.  ?Cardiovascular:  ?   Rate and Rhythm: Normal rate and regular rhythm.  ?   Heart sounds: No murmur heard. ?  No friction rub. No gallop.  ?Pulmonary:  ?   Effort: Pulmonary effort is normal.  ?   Breath sounds: Normal breath sounds.  ?Abdominal:  ?   General: Abdomen is flat.  ?   Palpations: Abdomen is soft.  ?Skin: ?   General: Skin is warm and dry.  ?Neurological:  ?   General: No focal deficit present.  ?   Mental Status: She is alert and oriented to person, place, and time.  ?Psychiatric:     ?   Mood and Affect: Mood normal.  ? ? ?Lab Results: ?Lab Results  ?Component Value Date  ? WBC 7.1 07/02/2021  ? HGB 11.4 (A) 07/02/2021  ?  HCT 34 (A) 07/02/2021  ? MCV 95.4 12/01/2020  ? PLT 183 07/02/2021  ?  ?Lab Results  ?Component Value Date  ? CREATININE 7.4 (A) 07/02/2021  ? BUN 45 (A) 07/02/2021  ? NA 132 (A) 07/02/2021  ? K 4.5 03

## 2021-07-24 NOTE — Progress Notes (Signed)
[place], [place] ([id number]) ?Visit Report for 07/24/2021 ?Arrival Information Details ?Patient Name: Date of Service: ?MCNEILL DA NIELS, Charnay 07/24/2021 10:30 A M ?Medical Record Number: [id number] ?Patient Account Number: [id number] ?Date of Birth/Sex: Treating RN: ?1980-06-22 (41 y.o. F) Boehlein, Linda ?Primary Care Murvin Gift: SITA FA LWA LLA, SHA RMIN Other Clinician: ?Referring Briley Bumgarner: ?Treating Loel Betancur/Extender: Fredirick Maudlin ?SITA FA LWA LLA, SHA RMIN ?Weeks in Treatment: 29 ?Visit Information History Since Last Visit ?Added or deleted any medications: No ?Patient Arrived: Wheel Chair ?Any new allergies or adverse reactions: No ?Arrival Time: 10:33 ?Had a fall or experienced change in No ?Accompanied By: self ?activities of daily living that may affect ?Transfer Assistance: Manual ?risk of falls: ?Patient Identification Verified: Yes ?Signs or symptoms of abuse/neglect since last visito No ?Secondary Verification Process Completed: Yes ?Hospitalized since last visit: No ?Patient Requires Transmission-Based Precautions: No ?Implantable device outside of the clinic excluding No ?Patient Has Alerts: No ?cellular tissue based products placed in the center ?since last visit: ?Has Dressing in Place as Prescribed: Yes ?Pain Present Now: Yes ?Electronic Signature(s) ?Signed: 07/24/2021 4:25:50 PM By: [name] RN, BSN ?Entered By: [name] on 07/24/2021 10:38:47 ?-------------------------------------------------------------------------------- ?Encounter Discharge Information Details ?Patient Name: Date of Service: ?MCNEILL DA NIELS, Emmalea 07/24/2021 10:30 A M ?Medical Record Number: [id number] ?Patient Account Number: [id number] ?Date of Birth/Sex: Treating RN: ?1981/03/14 (41 y.o. F) Boehlein, Linda ?Primary Care Dimonique Bourdeau: SITA FA LWA LLA, SHA RMIN Other Clinician: ?Referring Bowdy Bair: ?Treating Cleve Paolillo/Extender: Fredirick Maudlin ?SITA FA LWA LLA, SHA RMIN ?Weeks in Treatment: 29 ?Encounter Discharge  Information Items Post Procedure Vitals ?Discharge Condition: Stable ?Temperature (F): 98.4 ?Ambulatory Status: Wheelchair ?Pulse (bpm): 83 ?Discharge Destination: [place] ?Respiratory Rate (breaths/min): 18 ?Telephoned: No ?Blood Pressure (mmHg): 135/66 ?Orders Sent: Yes ?Transportation: Other ?Accompanied By: self ?Schedule Follow-up Appointment: Yes ?Clinical Summary of Care: Patient Declined ?Notes ?facility transportation ?Electronic Signature(s) ?Signed: 07/24/2021 4:25:50 PM By: [name] RN, BSN ?Entered By: [name] on 07/24/2021 11:36:04 ?-------------------------------------------------------------------------------- ?Lower Extremity Assessment Details ?Patient Name: ?Date of Service: ?MCNEILL DA NIELS, Antonisha 07/24/2021 10:30 A M ?Medical Record Number: [id number] ?Patient Account Number: [id number] ?Date of Birth/Sex: ?Treating RN: ?04/27/1981 (41 y.o. F) Boehlein, Linda ?Primary Care Alaster Asfaw: SITA FA LWA LLA, SHA RMIN ?Other Clinician: ?Referring Dilara Navarrete: ?Treating Jerred Zaremba/Extender: Fredirick Maudlin ?SITA FA LWA LLA, SHA RMIN ?Weeks in Treatment: 29 ?Edema Assessment ?Assessed: [Left: No] [Right: No] ?Edema: [Left: Ye] [Right: s] ?Calf ?Left: Right: ?Point of Measurement: From Medial Instep 36.5 cm ?Ankle ?Left: Right: ?Point of Measurement: From Medial Instep 25 cm ?Vascular Assessment ?Pulses: ?Dorsalis Pedis ?Palpable: [Left:Yes] ?Electronic Signature(s) ?Signed: 07/24/2021 4:25:50 PM By: [name] RN, BSN ?Entered By: [name] on 07/24/2021 10:45:57 ?-------------------------------------------------------------------------------- ?Multi Wound Chart Details ?Patient Name: ?Date of Service: ?MCNEILL DA NIELS, Harpreet 07/24/2021 10:30 A M ?Medical Record Number: [id number] ?Patient Account Number: [id number] ?Date of Birth/Sex: ?Treating RN: ?10/22/1980 (41 y.o. F) Boehlein, Linda ?Primary Care Artemus Romanoff: SITA FA LWA LLA, SHA RMIN ?Other Clinician: ?Referring  Ruhee Enck: ?Treating Jahaira Earnhart/Extender: Fredirick Maudlin ?SITA FA LWA LLA, SHA RMIN ?Weeks in Treatment: 29 ?Vital Signs ?Height(in): ?Capillary Blood Glucose(mg/dl): 126 ?Weight(lbs): ?Pulse(bpm): 83 ?Body Mass Index(BMI): ?Blood Pressure(mmHg): 135/86 ?Temperature(??F): 98.4 ?Respiratory Rate(breaths/min): 18 ?Photos: [6:Perineum] [7:Left T Fourth oe] [8:Sacrum] ?Wound Location: [6:Pressure Injury] [7:Trauma] [8:Pressure Injury] ?Wounding Event: [6:Pressure Ulcer] [7:Abrasion] [8:Pressure Ulcer] ?Primary Etiology: [6:Cataracts, Glaucoma, Anemia,] [7:Cataracts, Glaucoma, Anemia,] [8:Cataracts, Glaucoma, Anemia,] ?Comorbid History: [6:Congestive Heart Failure, Hypertension, Type II Diabetes, End Hypertension, Type II Diabetes, End Hypertension, Type II Diabetes, End Stage  Renal Disease, Neuropathy, Confinement Anxiety 02/04/2021] [7:Congestive Heart Failure, Stage  ?Renal Disease, Neuropathy, Confinement Anxiety 07/08/2021] [8:Congestive Heart Failure, Stage Renal Disease, Neuropathy, Confinement Anxiety 07/15/2021] ?Date Acquired: [6:24] [7:2] [8:1] ?Weeks of Treatment: [6:Open] [7:Open] [8:Open] ?Wound Status: [6:No] [7:No] [8:No] ?Wound Recurrence: [6:No] [7:Yes] [8:No] ?Clustered Wound: [6:1.6x0.7x0.1] [7:0.5x1.2x0.5] [8:1.7x0.4x0.1] ?Measurements L x W x D (cm) [6:0.88] [7:0.471] [8:0.534] ?A (cm?) : ?rea [6:0.088] [7:0.236] [8:0.053] ?Volume (cm?) : [6:50.00%] [7:16.60%] [8:-25.90%] ?% Reduction in A [6:rea: 75.00%] [7:-38.80%] [8:37.60%] ?% Reduction in Volume: [6:Category/Stage II] [7:Full Thickness With Exposed Support Category/Stage II] ?Classification: [6:Medium] [7:Structures Medium] [8:Medium] ?Exudate A mount: [6:Serosanguineous] [7:Serosanguineous] [8:Serosanguineous] ?Exudate Type: [6:red, brown] [7:red, brown] [8:red, brown] ?Exudate Color: [6:Flat and Intact] [7:Distinct, outline attached] [8:Fibrotic scar, thickened scar] ?Wound Margin: [6:Large (67-100%)] [7:Small (1-33%)] [8:Large  (67-100%)] ?Granulation A mount: [6:Red] [7:Red] [8:Red] ?Granulation Quality: [6:None Present (0%)] [7:Large (67-100%)] [8:Small (1-33%)] ?Necrotic A mount: ?[6:Fat Layer (Subcutaneous Tissue): Yes Fat Layer (Subcutaneous Tissue): Yes Fat Layer (Subcutaneous Tissue): Yes] ?Exposed Structures: ?[6:Fascia: No Tendon: No Muscle: No Joint: No Bone: No Small (1-33%)] [7:Tendon: Yes Fascia: No Muscle: No Joint: No Bone: No None] [8:Fascia: No Tendon: No Muscle: No Joint: No Bone: No Small (1-33%)] ?Epithelialization: [6:Debridement - Selective/Open Wound Debridement - Excisional] [8:Debridement - Selective/Open Wound] ?Debridement: ?Pre-procedure Verification/Time Out 11:00 [7:11:00] [8:11:00] ?Taken: [6:Lidocaine 4% Topical Solution] [7:Lidocaine 4% Topical Solution] [8:Lidocaine 4% Topical Solution] ?Pain Control: [6:Slough] [7:Subcutaneous, Slough] [8:Slough] ?Tissue Debrided: [6:Non-Viable Tissue] [7:Skin/Subcutaneous Tissue] [8:Non-Viable Tissue] ?Level: [6:1.12] [7:0.6] [8:0.68] ?Debridement A (sq cm): [6:rea Curette] [7:Curette] [8:Curette] ?Instrument: [6:Minimum] [7:Minimum] [8:Minimum] ?Bleeding: [6:Pressure] [7:Pressure] [8:Pressure] ?Hemostasis A chieved: [6:2] [7:2] [8:2] ?Procedural Pain: [6:1] [7:1] [8:1] ?Post Procedural Pain: [6:Procedure was tolerated well] [7:Procedure was tolerated well] [8:Procedure was tolerated well] ?Debridement Treatment Response: [6:1.6x0.7x0.1] [7:0.5x1.2x0.5] [8:1.7x0.4x0.1] ?Post Debridement Measurements L x ?W x D (cm) [6:0.088] [7:0.236] [8:0.053] ?Post Debridement Volume: (cm?) [6:Category/Stage II] [7:N/A] [8:Category/Stage II] ?Post Debridement Stage: [6:Debridement] [7:Debridement] [8:Debridement] ?Treatment Notes ?Electronic Signature(s) ?Signed: 07/24/2021 11:16:54 AM By: Fredirick Maudlin MD FACS ?Signed: 07/24/2021 4:25:50 PM By: [name] RN, BSN ?Entered By: Fredirick Maudlin on 07/24/2021  11:16:53 ?-------------------------------------------------------------------------------- ?Multi-Disciplinary Care Plan Details ?Patient Name: ?Date of Service: ?MCNEILL DA NIELS, Kryslyn 07/24/2021 10:30 A M ?Medical Record Number: [id number] ?Patient Account Number: [id number] ?Date of Birth/Sex: ?Treat

## 2021-07-24 NOTE — Progress Notes (Signed)
[place], [place] ([id number]) ?Visit Report for 07/24/2021 ?Chief Complaint Document Details ?Patient Name: Date of Service: ?[name] Barnett, [name] Barnett 07/24/2021 10:30 A M ?Medical Record Number: [id number] ?Patient Account Number: [id number] ?Date of Birth/Sex: Treating RN: ?Jun 16, 1980 (40 y.o. F) Boehlein, Linda ?Primary Care Provider: SITA FA LWA LLA, SHA RMIN Other Clinician: ?Referring Provider: ?Treating Provider/Extender: Fredirick Maudlin ?SITA FA LWA LLA, SHA RMIN ?Weeks in Treatment: 29 ?Information Obtained from: Patient ?Chief Complaint ?06/15/2019; patient is here for review of the wound on her lower sacrum ?12/31/2020; patient is here for review of the wound on her right buttock ?Electronic Signature(s) ?Signed: 07/24/2021 11:17:03 AM By: Fredirick Maudlin MD FACS ?Entered By: Fredirick Maudlin on 07/24/2021 11:17:03 ?-------------------------------------------------------------------------------- ?Debridement Details ?Patient Name: Date of Service: ?[name] Barnett, [name] Barnett 07/24/2021 10:30 A M ?Medical Record Number: [id number] ?Patient Account Number: [id number] ?Date of Birth/Sex: Treating RN: ?1980/09/03 (40 y.o. F) Boehlein, Linda ?Primary Care Provider: SITA FA LWA LLA, SHA RMIN Other Clinician: ?Referring Provider: ?Treating Provider/Extender: Fredirick Maudlin ?SITA FA LWA LLA, SHA RMIN ?Weeks in Treatment: 29 ?Debridement Performed for Assessment: Wound #8 Sacrum ?Performed By: Physician Fredirick Maudlin, MD ?Debridement Type: Debridement ?Level of Consciousness (Pre-procedure): Awake and Alert ?Pre-procedure Verification/Time Out Yes - 11:00 ?Taken: ?Start Time: 11:03 ?Pain Control: Lidocaine 4% T opical Solution ?T Area Debrided (L x W): ?otal 1.7 (cm) x 0.4 (cm) = 0.68 (cm?) ?Tissue and other material debrided: Non-Viable, [place], [place] ?Level: Non-Viable Tissue ?Debridement Description: Selective/Open Wound ?Instrument: Curette ?Bleeding: Minimum ?Hemostasis Achieved: Pressure ?Procedural Pain:  2 ?Post Procedural Pain: 1 ?Response to Treatment: Procedure was tolerated well ?Level of Consciousness (Post- Awake and Alert ?procedure): ?Post Debridement Measurements of Total Wound ?Length: (cm) 1.7 ?Stage: Category/Stage II ?Width: (cm) 0.4 ?Depth: (cm) 0.1 ?Volume: (cm?) 0.053 ?Character of Wound/Ulcer Post Debridement: Improved ?Post Procedure Diagnosis ?Same as Pre-procedure ?Electronic Signature(s) ?Signed: 07/24/2021 11:43:08 AM By: Fredirick Maudlin MD FACS ?Signed: 07/24/2021 4:25:50 PM By: [name] RN, BSN ?Entered By: [name] on 07/24/2021 11:06:11 ?-------------------------------------------------------------------------------- ?Debridement Details ?Patient Name: ?Date of Service: ?[name] Barnett, [name] Barnett 07/24/2021 10:30 A M ?Medical Record Number: [id number] ?Patient Account Number: [id number] ?Date of Birth/Sex: ?Treating RN: ?April 30, 1981 (41 y.o. F) Boehlein, Linda ?Primary Care Provider: SITA FA LWA LLA, SHA RMIN ?Other Clinician: ?Referring Provider: ?Treating Provider/Extender: Fredirick Maudlin ?SITA FA LWA LLA, SHA RMIN ?Weeks in Treatment: 29 ?Debridement Performed for Assessment: Wound #6 Perineum ?Performed By: Physician Fredirick Maudlin, MD ?Debridement Type: Debridement ?Level of Consciousness (Pre-procedure): Awake and Alert ?Pre-procedure Verification/Time Out Yes - 11:00 ?Taken: ?Start Time: 11:03 ?Pain Control: Lidocaine 4% T opical Solution ?T Area Debrided (L x W): ?otal 1.6 (cm) x 0.7 (cm) = 1.12 (cm?) ?Tissue and other material debrided: Non-Viable, [place], [place] ?Level: Non-Viable Tissue ?Debridement Description: Selective/Open Wound ?Instrument: Curette ?Bleeding: Minimum ?Hemostasis Achieved: Pressure ?Procedural Pain: 2 ?Post Procedural Pain: 1 ?Response to Treatment: Procedure was tolerated well ?Level of Consciousness (Post- Awake and Alert ?procedure): ?Post Debridement Measurements of Total Wound ?Length: (cm) 1.6 ?Stage: Category/Stage II ?Width: (cm) 0.7 ?Depth:  (cm) 0.1 ?Volume: (cm?) 0.088 ?Character of Wound/Ulcer Post Debridement: Improved ?Post Procedure Diagnosis ?Same as Pre-procedure ?Electronic Signature(s) ?Signed: 07/24/2021 11:43:08 AM By: Fredirick Maudlin MD FACS ?Signed: 07/24/2021 4:25:50 PM By: [name] RN, BSN ?Entered By: [name] on 07/24/2021 11:06:44 ?-------------------------------------------------------------------------------- ?Debridement Details ?Patient Name: ?Date of Service: ?[name] Barnett, [name] Barnett 07/24/2021 10:30 A M ?Medical Record Number: [id number] ?Patient Account Number: [id number] ?Date of Birth/Sex: ?Treating RN: ?1980-10-30 (41 y.o. F) Boehlein, Linda ?  Primary Care Provider: SITA FA LWA LLA, SHA RMIN ?Other Clinician: ?Referring Provider: ?Treating Provider/Extender: Fredirick Maudlin ?SITA FA LWA LLA, SHA RMIN ?Weeks in Treatment: 29 ?Debridement Performed for Assessment: Wound #7 Left T Fourth ?oe ?Performed By: Physician Fredirick Maudlin, MD ?Debridement Type: Debridement ?Severity of Tissue Pre Debridement: Necrosis of muscle ?Level of Consciousness (Pre-procedure): Awake and Alert ?Pre-procedure Verification/Time Out Yes - 11:00 ?Taken: ?Start Time: 11:03 ?Pain Control: Lidocaine 4% T opical Solution ?T Area Debrided (L x W): ?otal 0.5 (cm) x 1.2 (cm) = 0.6 (cm?) ?Tissue and other material debrided: Viable, Non-Viable, [place], Subcutaneous, [place] ?Level: Skin/Subcutaneous Tissue ?Debridement Description: Excisional ?Instrument: Curette ?Bleeding: Minimum ?Hemostasis Achieved: Pressure ?Procedural Pain: 2 ?Post Procedural Pain: 1 ?Response to Treatment: Procedure was tolerated well ?Level of Consciousness (Post- Awake and Alert ?procedure): ?Post Debridement Measurements of Total Wound ?Length: (cm) 0.5 ?Width: (cm) 1.2 ?Depth: (cm) 0.5 ?Volume: (cm?) 0.236 ?Character of Wound/Ulcer Post Debridement: Improved ?Severity of Tissue Post Debridement: Necrosis of muscle ?Post Procedure Diagnosis ?Same as  Pre-procedure ?Electronic Signature(s) ?Signed: 07/24/2021 11:43:08 AM By: Fredirick Maudlin MD FACS ?Signed: 07/24/2021 4:25:50 PM By: [name] RN, BSN ?Entered By: [name] on 07/24/2021 11:09:02 ?-------------------------------------------------------------------------------- ?HPI Details ?Patient Name: Date of Service: ?[name] Barnett, [name] Barnett 07/24/2021 10:30 A M ?Medical Record Number: [id number] ?Patient Account Number: [id number] ?Date of Birth/Sex: Treating RN: ?01-Aug-1980 (40 y.o. F) Boehlein, Linda ?Primary Care Provider: SITA FA LWA LLA, SHA RMIN Other Clinician: ?Referring Provider: ?Treating Provider/Extender: Fredirick Maudlin ?SITA FA LWA LLA, SHA RMIN ?Weeks in Treatment: 29 ?History of Present Illness ?HPI Description: Admission ?06/15/2019 ?This is a 41 year old woman who comes to [place] from [facility] skilled facility in [place]. I am able to follow a lot of her history on care ?everywhere in epic. The patient is a type II diabetic and has been on dialysis. 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 I am not exactly sure of the nature of the stroke at this present time. She has made a decent recovery and is now up walking with a ?walker. ?In August 2020 she was admitted to [facility] in [place] with L4-L5 discitis. Culture and sensitivity showed Klebsiella. She was discharged on 8 ?weeks of ceftazidime. In mid September she is readmitted to the hospital with a pressure ulcer on her lower sacrum and necrotizing fasciitis. She required an ?extensive surgical debridement. CT scan of the pelvis at 1 point showed communication with the rectum. A colostomy was recommended and placed. Culture ?of this area showed Klebsiella and yeast. She was discharged on an extended course of meropenem and Diflucan. She was discharged to select specialty ?hospitals in [place]. We do not have any of these records. I think  she was discharged to another nursing home but is come to [facility] skilled facility ?in [facility]. She was sent down here for review of the remaining wound. By review of the pictures that the patient had from [facility]

## 2021-07-24 NOTE — Therapy (Signed)
?OUTPATIENT PHYSICAL THERAPY TREATMENT NOTE ? ? ?Patient Name: [name] Barnett ?MRN: [id number] ?DOB:12-May-1980, 41 y.o., female ?Today's Date: 07/24/2021 ? ?PCP: Julianne Handler, DO ?REFERRING PROVIDER: Sitafalwalla, Sharmin, * ? ? PT End of Session - 07/24/21 1308   ? ? Visit Number 3   ? Number of Visits 12   ? Date for PT Re-Evaluation 08/12/21   ? Authorization Type medicare/medicaid 80/20   ? Progress Note Due on Visit 10   ? PT Start Time 1520   ? PT Stop Time 1400   ? PT Time Calculation (min) 1360 min   ? ?  ?  ? ?  ? ? ?Past Medical History:  ?Diagnosis Date  ? Diabetes mellitus without complication ([place])   ? Hyperlipidemia   ? Hypertension   ? Renal disorder   ? Stroke [facility])   ? Vitamin D deficiency   ? ?Past Surgical History:  ?Procedure Laterality Date  ? cardiac stents    ? CATARACT EXTRACTION W/PHACO Left 08/29/2020  ? Procedure: CATARACT EXTRACTION PHACO AND INTRAOCULAR LENS PLACEMENT LEFT EYE;  Surgeon: Baruch Goldmann, MD;  Location: [facility];  Service: Ophthalmology;  Laterality: Left;  left ?CDE=29.30  ? CATARACT EXTRACTION W/PHACO Right 11/28/2020  ? Procedure: CATARACT EXTRACTION PHACO AND INTRAOCULAR LENS PLACEMENT (IOC);  Surgeon: Baruch Goldmann, MD;  Location: [facility];  Service: Ophthalmology;  Laterality: Right;  CDE 4.35  ? COLOSTOMY    ? TUBAL LIGATION    ? wound on buttocks    ? ?Patient Active Problem List  ? Diagnosis Date Noted  ? Chronic kidney disease (CKD) stage G5/A1, glomerular filtration rate (GFR) less than or equal to 15 mL/min/1.73 square meter and albuminuria creatinine ratio less than 30 mg/g (HCC) 06/22/2021  ? Dialysis patient [facility]) 06/22/2021  ? BV (bacterial vaginosis) 04/02/2020  ? Vaginal itching 04/02/2020  ? Vaginal odor 04/02/2020  ? Vaginal discharge 04/02/2020  ? Sepsis secondary to UTI ([place]) 01/13/2020  ? Acute respiratory failure with hypoxia ([place]) 01/13/2020  ? Right leg swelling 01/13/2020  ? Sacral decubitus ulcer 01/13/2020  ? Anemia due to end stage  renal disease ([place]) 01/13/2020  ? Essential hypertension 01/13/2020  ? Hyperlipidemia 01/13/2020  ? Type 2 diabetes mellitus ([place]) 01/13/2020  ? ? ?REFERRING DIAG: Lymphedema ? ?THERAPY DIAG:  ?Lymphedema, not elsewhere classified ? ?Difficulty in walking, not elsewhere classified ? ?PERTINENT HISTORY: Lymphedema  ? ?PRECAUTIONS: Other: cellulitis   ? ?SUBJECTIVE: Pt states she has been doing her exercises. ? ?PAIN:  ?Are you having pain?  ?Treatment:  07/24/2021 ? ?LYMPHEDEMA ASSESSMENTS:  ?  ?  ?  ?LE LANDMARK RIGHT ?07/13/2021       07/24/2021  ?At groin    ?30 cm proximal to suprapatella    ?20 cm proximal to suprapatella 62.5                    ? ?   ?10 cm proximal to suprapatella 56  ?At midpatella / popliteal crease 41.2  ?30 cm proximal to floor at lateral plantar foot 40.1                 39.2  ?20 cm proximal to floor at lateral plantar foot 38.6                 33.5  ?10 cm proximal to floor at lateral plantar foot 30.3                 30.3  ?Circumference  of ankle/heel 34                     33.7 ?   ?5 cm proximal to 1st MTP joint 26                     25.5  ?Across MTP joint 26                     25.4  ?Around proximal great toe    ?(Blank rows = not tested) ?  ?LE LANDMARK LEFT ?07/13/2021  ?At groin    ?30 cm proximal to suprapatella    ?20 cm proximal to suprapatella 59.8  ?10 cm proximal to suprapatella 51  ?At [place] / popliteal crease 41.8  ?30 cm proximal to floor at lateral plantar foot 34.4  ?20 cm proximal to floor at lateral plantar foot 30.2  ?10 cm proximal to floor at lateral plantar foot 26.5  ?Circumference of ankle/heel 33.2  ?5 cm proximal to 1st MTP joint 25  ?Across MTP joint 25.8  ?Around proximal great toe    ?(Blank rows = not tested) ?  ? ? ? ?   ?Ambulation/Gait  ?Ambulation/Gait Yes  ?Ambulation/Gait Assistance 4: Min guard  ?Ambulation Distance (Feet) 64 Feet  ?Assistive device Rolling walker  ?Gait Pattern Step-to pattern;Decreased step length - right;Decreased step  length - left;Decreased stride length;Decreased hip/knee flexion - right;Decreased hip/knee flexion - left;Decreased weight shift to right;Right flexed knee in stance;Left flexed knee in stance;Antalgic  ?Manual Therapy  ?Manual Therapy Manual Lymphatic Drainage (MLD);Compression Bandaging  ?Manual therapy comments done seperate from all other aspects of treatment  ?Manual Lymphatic Drainage (MLD) To include supraclavicular, deep and superfical abdominal, Rt inguinal axillary and intrainguinal anastomosis as well as anterior and posterior RT LE ; posterior completed semiprone.  ?Compression Bandaging Foam and multilayer short stretch bandages from foot to knee of RT LE  ? ? ?PATIENT EDUCATION: ?Education details: Reviewed importance of HEP, bandage wear time, given paperwork for facility to purchase pump ?Person educated: Patient ?Education method: Explanation ?Education comprehension: verbalized understanding ? ? ?HOME EXERCISE PROGRAM: ?Ankle pumps, LAQ, hip ab/adduction, marching, diaphragm breathing, lymph squeeze   ? ?               ?ASSESSMENT: ?  ?CLINICAL IMPRESSION: ?Patient is a 41 y.o. f who was seen today for physical therapy evaluation and treatment for lymphedema. She has noted induration of her Rt LE especially around the ankle.  She will benefit from total decongestive therapy of the Rt LE.  The Lt LE has edema as well, however at this point I feel manual and compression garment will be sufficient for her Lt LE.  [name] will benefit from a compression pump, however, since she is residing at a facility they will be responsible for obtaining the pump.   ?  ?  ?OBJECTIVE IMPAIRMENTS decreased ROM and increased edema.  ?  ?ACTIVITY LIMITATIONS  mobility  .  ?  ?PERSONAL FACTORS 1-2 comorbidities: CVA, sacral decubiti,   are also affecting patient's functional outcome.  ?  ?  ?REHAB POTENTIAL: Good ?  ?CLINICAL DECISION MAKING: Evolving/moderate complexity ?  ?EVALUATION COMPLEXITY: Low ?  ? PT Short  Term Goals - 04/03/21 1537   ? ?  ? PT SHORT TERM GOAL #1  ? Title PT to be able to vebalize the four aspects to control lymphedema.   ? Status On-going   ?  Target Date 04/15/21   ? ?  ?  ? ?  ? ? ? PT Long Term Goals - 04/03/21 1537   ? ?  ? PT LONG TERM GOAL #1  ? Title PT LE measurements to be decreased 2-4 cm to reduce risk of cellulitis   ? Time 4   ? Period Weeks   ? Status On-going   ? ?  ?  ? ?  ? ?  ?PLAN: ?PT FREQUENCY: 3x/week ?  ?PT DURATION: 6 weeks ?  ?PLANNED INTERVENTIONS: Patient/Family education, Manual lymph drainage, and Compression bandaging ?  ?PLAN FOR NEXT SESSION: Continue with ambulation and total decongestive techniques.  Measure on Fridays.  ?Rayetta Humphrey, PT CLT ?[phone]  ?07/24/2021, 1:09 PM ? ?  ? ? ?

## 2021-07-27 ENCOUNTER — Ambulatory Visit (INDEPENDENT_AMBULATORY_CARE_PROVIDER_SITE_OTHER): Payer: Medicare Other | Admitting: Orthopedic Surgery

## 2021-07-27 ENCOUNTER — Other Ambulatory Visit: Payer: Self-pay

## 2021-07-27 ENCOUNTER — Ambulatory Visit (HOSPITAL_COMMUNITY): Payer: Medicare Other | Admitting: Physical Therapy

## 2021-07-27 DIAGNOSIS — R262 Difficulty in walking, not elsewhere classified: Secondary | ICD-10-CM

## 2021-07-27 DIAGNOSIS — G839 Paralytic syndrome, unspecified: Secondary | ICD-10-CM | POA: Diagnosis not present

## 2021-07-27 DIAGNOSIS — M62469 Contracture of muscle, unspecified lower leg: Secondary | ICD-10-CM | POA: Diagnosis not present

## 2021-07-27 DIAGNOSIS — I890 Lymphedema, not elsewhere classified: Secondary | ICD-10-CM | POA: Diagnosis not present

## 2021-07-27 DIAGNOSIS — M62452 Contracture of muscle, left thigh: Secondary | ICD-10-CM | POA: Diagnosis not present

## 2021-07-27 NOTE — Therapy (Addendum)
?OUTPATIENT PHYSICAL THERAPY TREATMENT NOTE ? ? ?Patient Name: [name] Barnett ?MRN: [id number] ?DOB:1980-09-03, 41 y.o., female ?Today's Date: 07/27/2021 ? ?PCP: Julianne Handler, DO ?REFERRING PROVIDER: Sitafalwalla, Sharmin, * ? ? PT End of Session - 07/27/21 1313   ? ? Visit Number 4   ? Number of Visits 12   ? Date for PT Re-Evaluation 08/12/21   ? Authorization Type medicare/medicaid 80/20   ? Progress Note Due on Visit 10   ? PT Start Time 1320   ? PT Stop Time 1400   ? PT Time Calculation (min) 40 min   ? ?  ?  ? ?  ? ? ?Past Medical History:  ?Diagnosis Date  ? Diabetes mellitus without complication ([place])   ? Hyperlipidemia   ? Hypertension   ? Renal disorder   ? Stroke [facility])   ? Vitamin D deficiency   ? ?Past Surgical History:  ?Procedure Laterality Date  ? cardiac stents    ? CATARACT EXTRACTION W/PHACO Left 08/29/2020  ? Procedure: CATARACT EXTRACTION PHACO AND INTRAOCULAR LENS PLACEMENT LEFT EYE;  Surgeon: Baruch Goldmann, MD;  Location: [facility];  Service: Ophthalmology;  Laterality: Left;  left ?CDE=29.30  ? CATARACT EXTRACTION W/PHACO Right 11/28/2020  ? Procedure: CATARACT EXTRACTION PHACO AND INTRAOCULAR LENS PLACEMENT (IOC);  Surgeon: Baruch Goldmann, MD;  Location: [facility];  Service: Ophthalmology;  Laterality: Right;  CDE 4.35  ? COLOSTOMY    ? TUBAL LIGATION    ? wound on buttocks    ? ?Patient Active Problem List  ? Diagnosis Date Noted  ? Chronic kidney disease (CKD) stage G5/A1, glomerular filtration rate (GFR) less than or equal to 15 mL/min/1.73 square meter and albuminuria creatinine ratio less than 30 mg/g (HCC) 06/22/2021  ? Dialysis patient [facility]) 06/22/2021  ? BV (bacterial vaginosis) 04/02/2020  ? Vaginal itching 04/02/2020  ? Vaginal odor 04/02/2020  ? Vaginal discharge 04/02/2020  ? Sepsis secondary to UTI ([place]) 01/13/2020  ? Acute respiratory failure with hypoxia ([place]) 01/13/2020  ? Right leg swelling 01/13/2020  ? Sacral decubitus ulcer 01/13/2020  ? Anemia due to end stage  renal disease ([place]) 01/13/2020  ? Essential hypertension 01/13/2020  ? Hyperlipidemia 01/13/2020  ? Type 2 diabetes mellitus ([place]) 01/13/2020  ? ? ?REFERRING DIAG: Lymphedema ? ?THERAPY DIAG:  ?Lymphedema, not elsewhere classified ? ?Difficulty in walking, not elsewhere classified ? ?PERTINENT HISTORY: Lymphedema  ? ?PRECAUTIONS: Other: cellulitis   ? ?SUBJECTIVE: Pt states she has been doing her exercises. ? ?PAIN:  ?Are you having pain?  ? ?Treatment:   ?07/27/2021 ?                        ?   ?Ambulation/Gait  ?Ambulation/Gait Yes  ?Ambulation/Gait Assistance 4: Min guard  ?Ambulation Distance (Feet) 74 Feet  ?Assistive device Rolling walker  ?Gait Pattern Step-to pattern;Decreased step length - right;Decreased step length - left;Decreased stride length;Decreased hip/knee flexion - right;Decreased hip/knee flexion - left;Decreased weight shift to right;Right flexed knee in stance;Left flexed knee in stance;Antalgic  ?Manual Therapy  ?Manual Therapy Manual Lymphatic Drainage (MLD);Compression Bandaging  ?Manual therapy comments done seperate from all other aspects of treatment  ?Manual Lymphatic Drainage (MLD) To include supraclavicular, deep and superfical abdominal, Rt inguinal axillary and intrainguinal anastomosis as well as anterior and posterior RT LE ; posterior completed semiprone.  ?Compression Bandaging Foam and multilayer short stretch bandages from foot to knee of RT LE  ? ? ? ? ?07/24/2021 ? ?LYMPHEDEMA ASSESSMENTS:  ?  ?  ?  ?  LE LANDMARK RIGHT ?07/13/2021       07/24/2021  ?At groin    ?30 cm proximal to suprapatella    ?20 cm proximal to suprapatella 62.5                    ? ?   ?10 cm proximal to suprapatella 56  ?At midpatella / popliteal crease 41.2  ?30 cm proximal to floor at lateral plantar foot 40.1                 39.2  ?20 cm proximal to floor at lateral plantar foot 38.6                 33.5  ?10 cm proximal to floor at lateral plantar foot 30.3                 30.3  ?Circumference of  ankle/heel 34                     33.7 ?   ?5 cm proximal to 1st MTP joint 26                     25.5  ?Across MTP joint 26                     25.4  ?Around proximal great toe    ?(Blank rows = not tested) ?  ?LE LANDMARK LEFT ?07/13/2021  ?At groin    ?30 cm proximal to suprapatella    ?20 cm proximal to suprapatella 59.8  ?10 cm proximal to suprapatella 51  ?At [facility] / popliteal crease 41.8  ?30 cm proximal to floor at lateral plantar foot 34.4  ?20 cm proximal to floor at lateral plantar foot 30.2  ?10 cm proximal to floor at lateral plantar foot 26.5  ?Circumference of ankle/heel 33.2  ?5 cm proximal to 1st MTP joint 25  ?Across MTP joint 25.8  ?Around proximal great toe    ?(Blank rows = not tested) ?  ? ? ? ?   ?Ambulation/Gait  ?Ambulation/Gait Yes  ?Ambulation/Gait Assistance 4: Min guard  ?Ambulation Distance (Feet) 64 Feet  ?Assistive device Rolling walker  ?Gait Pattern Step-to pattern;Decreased step length - right;Decreased step length - left;Decreased stride length;Decreased hip/knee flexion - right;Decreased hip/knee flexion - left;Decreased weight shift to right;Right flexed knee in stance;Left flexed knee in stance;Antalgic  ?Manual Therapy  ?Manual Therapy Manual Lymphatic Drainage (MLD);Compression Bandaging  ?Manual therapy comments done seperate from all other aspects of treatment  ?Manual Lymphatic Drainage (MLD) To include supraclavicular, deep and superfical abdominal, Rt inguinal axillary and intrainguinal anastomosis as well as anterior and posterior RT LE ; posterior completed semiprone.  ?Compression Bandaging Foam and multilayer short stretch bandages from foot to knee of RT LE  ? ? ?PATIENT EDUCATION: ?Education details: Reviewed importance of HEP, bandage wear time, given paperwork for facility to purchase pump ?Person educated: Patient ?Education method: Explanation ?Education comprehension: verbalized understanding ? ? ?HOME EXERCISE PROGRAM: ?Ankle pumps, LAQ, hip ab/adduction,  marching, diaphragm breathing, lymph squeeze   ? ?               ?ASSESSMENT: ?  ?CLINICAL IMPRESSION:  Pt with improved tolerance to ambulation.  Rt LE induration has decreased slightly. Pt will continue to benefit from Complete decongestive techniques from skilled PT .  Second request to facility to purchase 20-30 mmhg compression knee high garment for pt as  she should be wearing this on her Lt LE at this time. ?  ?  ?OBJECTIVE IMPAIRMENTS decreased ROM and increased edema.  ?  ?ACTIVITY LIMITATIONS  mobility  .  ?  ?PERSONAL FACTORS 1-2 comorbidities: CVA, sacral decubiti,   are also affecting patient's functional outcome.  ?  ?  ?REHAB POTENTIAL: Good ?  ?CLINICAL DECISION MAKING: Evolving/moderate complexity ?  ?EVALUATION COMPLEXITY: Low ?  ? PT Short Term Goals - 04/03/21 1537   ? ?  ? PT SHORT TERM GOAL #1  ? Title PT to be able to vebalize the four aspects to control lymphedema.   ? Status On-going   ? Target Date 04/15/21   ? ?  ?  ? ?  ? ? ? PT Long Term Goals - 04/03/21 1537   ? ?  ? PT LONG TERM GOAL #1  ? Title PT LE measurements to be decreased 2-4 cm to reduce risk of cellulitis   ? Time 4   ? Period Weeks   ? Status On-going   ? ?  ?  ? ?  ? ?  ?PLAN: ?PT FREQUENCY: 3x/week ?  ?PT DURATION: 6 weeks ?  ?PLANNED INTERVENTIONS: Patient/Family education, Manual lymph drainage, and Compression bandaging ?  ?PLAN FOR NEXT SESSION: Continue with ambulation and total decongestive techniques.  Measure on Fridays.  ?Rayetta Humphrey, PT CLT ?[phone]  ?07/27/2021, 1:58 PM ? ?  ? ? ?

## 2021-07-28 ENCOUNTER — Telehealth (HOSPITAL_COMMUNITY): Payer: Self-pay | Admitting: Family Medicine

## 2021-07-29 ENCOUNTER — Encounter: Payer: Self-pay | Admitting: Orthopedic Surgery

## 2021-07-29 ENCOUNTER — Ambulatory Visit (HOSPITAL_COMMUNITY): Payer: Medicare Other | Admitting: Physical Therapy

## 2021-07-29 ENCOUNTER — Encounter (HOSPITAL_COMMUNITY): Payer: Self-pay | Admitting: Physical Therapy

## 2021-07-29 DIAGNOSIS — I890 Lymphedema, not elsewhere classified: Secondary | ICD-10-CM

## 2021-07-29 DIAGNOSIS — R262 Difficulty in walking, not elsewhere classified: Secondary | ICD-10-CM

## 2021-07-29 NOTE — Therapy (Signed)
?OUTPATIENT PHYSICAL THERAPY TREATMENT NOTE ? ? ?Patient Name: [name] Barnett ?MRN: [id number] ?DOB:[birth year], 41 y.o., female ?Today's Date: 07/29/2021 ? ?PCP: Julianne Handler, DO ?REFERRING PROVIDER: Julianne Handler, DO ? ? PT End of Session - 07/29/21 1303   ? ? Visit Number 5   ? Number of Visits 12   ? Date for PT Re-Evaluation 08/12/21   ? Authorization Type medicare/medicaid 80/20   ? Progress Note Due on Visit 10   ? PT Start Time 1315   ? PT Stop Time 1400   ? PT Time Calculation (min) 45 min   ? Activity Tolerance Patient tolerated treatment well;Patient limited by fatigue   ? Behavior During Therapy [facility] for tasks assessed/performed   ? ?  ?  ? ?  ? ? ?Past Medical History:  ?Diagnosis Date  ? Diabetes mellitus without complication ([place])   ? Hyperlipidemia   ? Hypertension   ? Renal disorder   ? Stroke [facility])   ? Vitamin D deficiency   ? ?Past Surgical History:  ?Procedure Laterality Date  ? cardiac stents    ? CATARACT EXTRACTION W/PHACO Left 08/29/2020  ? Procedure: CATARACT EXTRACTION PHACO AND INTRAOCULAR LENS PLACEMENT LEFT EYE;  Surgeon: Baruch Goldmann, MD;  Location: [facility];  Service: Ophthalmology;  Laterality: Left;  left ?CDE=29.30  ? CATARACT EXTRACTION W/PHACO Right 11/28/2020  ? Procedure: CATARACT EXTRACTION PHACO AND INTRAOCULAR LENS PLACEMENT (IOC);  Surgeon: Baruch Goldmann, MD;  Location: [facility];  Service: Ophthalmology;  Laterality: Right;  CDE 4.35  ? COLOSTOMY    ? TUBAL LIGATION    ? wound on buttocks    ? ?Patient Active Problem List  ? Diagnosis Date Noted  ? Chronic kidney disease (CKD) stage G5/A1, glomerular filtration rate (GFR) less than or equal to 15 mL/min/1.73 square meter and albuminuria creatinine ratio less than 30 mg/g (HCC) 06/22/2021  ? Dialysis patient [facility]) 06/22/2021  ? BV (bacterial vaginosis) 04/02/2020  ? Vaginal itching 04/02/2020  ? Vaginal odor 04/02/2020  ? Vaginal discharge 04/02/2020  ? Sepsis secondary to UTI ([place]) 01/13/2020  ? Acute  respiratory failure with hypoxia ([place]) 01/13/2020  ? Right leg swelling 01/13/2020  ? Sacral decubitus ulcer 01/13/2020  ? Anemia due to end stage renal disease ([place]) 01/13/2020  ? Essential hypertension 01/13/2020  ? Hyperlipidemia 01/13/2020  ? Type 2 diabetes mellitus ([place]) 01/13/2020  ? ? ?REFERRING DIAG: Lymphedema ? ?THERAPY DIAG:  ?Lymphedema, not elsewhere classified ? ?Difficulty in walking, not elsewhere classified ? ?PERTINENT HISTORY: Lymphedema  ? ?PRECAUTIONS: Other: cellulitis   ? ?SUBJECTIVE: Pt states she has no complaints  ? ?PAIN:  ?Are you having pain? Yes  ?Rt LE 7/10 increases with weight bearing.  Pain comes and goes.  ? ?Treatment:   ?07/27/2021 ?                        ?   ?Ambulation/Gait  ?Ambulation/Gait Yes  ?Ambulation/Gait Assistance 4: Min guard  ?Ambulation Distance (Feet) 60 Feet , pt states she is having pain in her RT LE   ?Assistive device Rolling walker  ?Gait Pattern Step-to pattern;Decreased step length - right;Decreased step length - left;Decreased stride length;Decreased hip/knee flexion - right;Decreased hip/knee flexion - left;Decreased weight shift to right;Right flexed knee in stance;Left flexed knee in stance;Antalgic  ?Manual Therapy  ?Manual Therapy Manual Lymphatic Drainage (MLD);Compression Bandaging  ?Manual therapy comments done seperate from all other aspects of treatment  ?Manual Lymphatic Drainage (MLD) To include supraclavicular,  deep and superfical abdominal, Rt inguinal axillary and intrainguinal anastomosis as well as anterior and posterior RT LE ; posterior completed semiprone.  ?Compression Bandaging Foam and multilayer short stretch bandages from foot to knee of RT LE  ? ? ? ? ?07/24/2021 ? ?LYMPHEDEMA ASSESSMENTS:  ?  ?  ?  ?LE LANDMARK RIGHT ?07/13/2021       07/24/2021  ?At groin    ?30 cm proximal to suprapatella    ?20 cm proximal to suprapatella 62.5                    ? ?   ?10 cm proximal to suprapatella 56  ?At midpatella / popliteal crease 41.2   ?30 cm proximal to floor at lateral plantar foot 40.1                 39.2  ?20 cm proximal to floor at lateral plantar foot 38.6                 33.5  ?10 cm proximal to floor at lateral plantar foot 30.3                 30.3  ?Circumference of ankle/heel 34                     33.7 ?   ?5 cm proximal to 1st MTP joint 26                     25.5  ?Across MTP joint 26                     25.4  ?Around proximal great toe    ?(Blank rows = not tested) ?  ?LE LANDMARK LEFT ?07/13/2021  ?At groin    ?30 cm proximal to suprapatella    ?20 cm proximal to suprapatella 59.8  ?10 cm proximal to suprapatella 51  ?At [place] / popliteal crease 41.8  ?30 cm proximal to floor at lateral plantar foot 34.4  ?20 cm proximal to floor at lateral plantar foot 30.2  ?10 cm proximal to floor at lateral plantar foot 26.5  ?Circumference of ankle/heel 33.2  ?5 cm proximal to 1st MTP joint 25  ?Across MTP joint 25.8  ?Around proximal great toe    ?(Blank rows = not tested) ?  ? ? ? ?   ?Ambulation/Gait  ?Ambulation/Gait Yes  ?Ambulation/Gait Assistance 4: Min guard  ?Ambulation Distance (Feet) 64 Feet  ?Assistive device Rolling walker  ?Gait Pattern Step-to pattern;Decreased step length - right;Decreased step length - left;Decreased stride length;Decreased hip/knee flexion - right;Decreased hip/knee flexion - left;Decreased weight shift to right;Right flexed knee in stance;Left flexed knee in stance;Antalgic  ?Manual Therapy  ?Manual Therapy Manual Lymphatic Drainage (MLD);Compression Bandaging  ?Manual therapy comments done seperate from all other aspects of treatment  ?Manual Lymphatic Drainage (MLD) To include supraclavicular, deep and superfical abdominal, Rt inguinal axillary and intrainguinal anastomosis as well as anterior and posterior RT LE ; posterior completed semiprone.  ?Compression Bandaging Foam and multilayer short stretch bandages from foot to knee of RT LE  ? ? ?PATIENT EDUCATION: ?Education details: Reviewed  importance of HEP, bandage wear time, given paperwork for facility to purchase pump ?Person educated: Patient ?Education method: Explanation ?Education comprehension: verbalized understanding ? ? ?HOME EXERCISE PROGRAM: ?Ankle pumps, LAQ, hip ab/adduction, marching, diaphragm breathing, lymph squeeze   ? ?               ?  ASSESSMENT: ?  ?CLINICAL IMPRESSION:  Therapist requested pt to wash short stretch bandages explaining to hand wash and let air dry.   ?Therapist called Deberah Pelton the director at [employer] as she was confused about needing compression garments and compression pumps.  Therapist answered all questions and Ophelia Charter vocalized understanding that pt would need to be pumping once a day and wearing her compression garments daily following discharge.    ?  ?OBJECTIVE IMPAIRMENTS decreased ROM and increased edema.  ?  ?ACTIVITY LIMITATIONS  mobility  .  ?  ?PERSONAL FACTORS 1-2 comorbidities: CVA, sacral decubiti,   are also affecting patient's functional outcome.  ?  ?  ?REHAB POTENTIAL: Good ?  ?CLINICAL DECISION MAKING: Evolving/moderate complexity ?  ?EVALUATION COMPLEXITY: Low ?  ? PT Short Term Goals - 04/03/21 1537   ? ?  ? PT SHORT TERM GOAL #1  ? Title PT to be able to vebalize the four aspects to control lymphedema.   ? Status On-going   ? Target Date 04/15/21   ? ?  ?  ? ?  ? ? ? PT Long Term Goals - 04/03/21 1537   ? ?  ? PT LONG TERM GOAL #1  ? Title PT LE measurements to be decreased 2-4 cm to reduce risk of cellulitis   ? Time 4   ? Period Weeks   ? Status On-going   ? ?  ?  ? ?  ? ?  ?PLAN: ?PT FREQUENCY: 3x/week ?  ?PT DURATION: 6 weeks ?  ?PLANNED INTERVENTIONS: Patient/Family education, Manual lymph drainage, and Compression bandaging ?  ?PLAN FOR NEXT SESSION: Continue with ambulation and total decongestive techniques.  Measure on Fridays.  ?Rayetta Humphrey, PT CLT ?[phone]  ?07/29/2021, 1:58 PM ? ?  ? ? ?

## 2021-07-29 NOTE — Progress Notes (Signed)
? ?Office Visit Note ?  ?Patient: [name] Barnett           ?Date of Birth: 1981-03-27           ?MRN: [id number] ?Visit Date: 07/27/2021 ?             ?Requested by: Julianne Handler, DO ?[address] ?[place],  [place] [address] ?PCP: Julianne Handler, DO ? ?Chief Complaint  ?Patient presents with  ? Left Leg - Pain  ? Left Ankle - Pain  ? ? ? ? ?HPI: ?Patient is a 41 year old woman who is seen for initial evaluation for left hamstring contracture as well as gastrocnemius contractures bilaterally.  Patient states she has trouble resting her left foot on the ground or straightening her left knee she is currently at skilled nursing.  Patient is status post a stroke with contractures in her lower extremities. ? ?Assessment & Plan: ?Visit Diagnoses:  ?1. 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 Contracture of left hamstring   ? ? ?Plan: With the contracture patient will need a Achilles lengthening and an AFO for the left.  Patient has extremely weak extension of the left knee and do not feel that any hamstring lengthening would help with the contracture of the left knee. ? ?Patient will call if she wishes to proceed with the Achilles lengthening. ? ?Follow-Up Instructions: Return if symptoms worsen or fail to improve.  ? ?Ortho Exam ? ?Patient is alert, oriented, no adenopathy, well-dressed, normal affect, normal respiratory effort. ?Examination patient lacks about 10 degrees of full extension of the left knee her knee extensor is very weak.  She has no active dorsiflexion of the ankle or toes on the left she does have active plantarflexion.  She has a fixed equinus contracture of 30 degrees on the left which is contracted with both the knee flexed and extended. ? ?Imaging: ?No results found. ?No images are attached to the encounter. ? ?Labs: ?Lab Results  ?Component Value Date  ? HGBA1C 8.1 03/25/2021  ? HGBA1C 7.1 (H) 01/13/2020  ? HGBA1C 6.2 (H) 02/08/2019  ? REPTSTATUS  12/05/2020 FINAL 12/01/2020  ? CULT (A) 12/01/2020  ?  >=100,000 COLONIES/mL ESCHERICHIA COLI ?Confirmed Extended Spectrum Beta-Lactamase Producer (ESBL).  In bloodstream infections from ESBL organisms, carbapenems are preferred over piperacillin/tazobactam. They are shown to have a lower risk of mortality. ?  ? LABORGA ESCHERICHIA COLI (A) 12/01/2020  ? ? ? ?Lab Results  ?Component Value Date  ? ALBUMIN 4.0 07/02/2021  ? ALBUMIN 4.4 12/01/2020  ? ALBUMIN 3.6 09/22/2020  ? ? ?Lab Results  ?Component Value Date  ? MG 2.3 09/22/2020  ? ?No results found for: VD25OH ? ?No results found for: PREALBUMIN ? ?  Latest Ref Rng & Units 07/02/2021  ? 12:00 AM 12/01/2020  ?  5:53 PM 11/28/2020  ?  8:55 AM  ?CBC EXTENDED  ?WBC  7.1      14.6     ?RBC 3.87 - 5.11 3.86      4.09     ?Hemoglobin 12.0 - 16.0 11.4      12.2   12.6    ?HCT 36 - 46 34      39.0   37.0    ?Platelets 150 - 400 K/uL 183      396     ?NEUT#  3.90      13.2     ?Lymph# 0.7 - 4.0 K/uL  0.9     ?  ?  This result is from an external source.  ? ? ? ?There is no height or weight on file to calculate BMI. ? ?Orders:  ?No orders of the defined types were placed in this encounter. ? ?No orders of the defined types were placed in this encounter. ? ? ? Procedures: ?No procedures performed ? ?Clinical Data: ?No additional findings. ? ?ROS: ? ?All other systems negative, except as noted in the HPI. ?Review of Systems ? ?Objective: ?Vital Signs: There were no vitals taken for this visit. ? ?Specialty Comments:  ?No specialty comments available. ? ?PMFS History: ?Patient Active Problem List  ? Diagnosis Date Noted  ? Chronic kidney disease (CKD) stage G5/A1, glomerular filtration rate (GFR) less than or equal to 15 mL/min/1.73 square meter and albuminuria creatinine ratio less than 30 mg/g (HCC) 06/22/2021  ? Dialysis patient [facility]) 06/22/2021  ? BV (bacterial vaginosis) 04/02/2020  ? Vaginal itching 04/02/2020  ? Vaginal odor 04/02/2020  ? Vaginal discharge 04/02/2020  ?  Sepsis secondary to UTI ([place]) 01/13/2020  ? Acute respiratory failure with hypoxia ([place]) 01/13/2020  ? Right leg swelling 01/13/2020  ? Sacral decubitus ulcer 01/13/2020  ? Anemia due to end stage renal disease ([place]) 01/13/2020  ? Essential hypertension 01/13/2020  ? Hyperlipidemia 01/13/2020  ? Type 2 diabetes mellitus ([place]) 01/13/2020  ? ?Past Medical History:  ?Diagnosis Date  ? Diabetes mellitus without complication ([place])   ? Hyperlipidemia   ? Hypertension   ? Renal disorder   ? Stroke [facility])   ? Vitamin D deficiency   ?  ?Family History  ?Problem Relation Age of Onset  ? Cancer Paternal Grandmother   ? Heart disease Father   ? Hypertension Father   ? Diabetes Father   ? Glaucoma Father   ? Heart disease Mother   ? Hypertension Mother   ? Diabetes Mother   ? Diabetes Sister   ?     borderline  ? Hypertension Sister   ?  ?Past Surgical History:  ?Procedure Laterality Date  ? cardiac stents    ? CATARACT EXTRACTION W/PHACO Left 08/29/2020  ? Procedure: CATARACT EXTRACTION PHACO AND INTRAOCULAR LENS PLACEMENT LEFT EYE;  Surgeon: Baruch Goldmann, MD;  Location: [facility];  Service: Ophthalmology;  Laterality: Left;  left ?CDE=29.30  ? CATARACT EXTRACTION W/PHACO Right 11/28/2020  ? Procedure: CATARACT EXTRACTION PHACO AND INTRAOCULAR LENS PLACEMENT (IOC);  Surgeon: Baruch Goldmann, MD;  Location: [facility];  Service: Ophthalmology;  Laterality: Right;  CDE 4.35  ? COLOSTOMY    ? TUBAL LIGATION    ? wound on buttocks    ? ?Social History  ? ?Occupational History  ? Not on file  ?Tobacco Use  ? Smoking status: Never  ? Smokeless tobacco: Never  ?Vaping Use  ? Vaping Use: Never used  ?Substance and Sexual Activity  ? Alcohol use: Never  ? Drug use: Never  ? Sexual activity: Not Currently  ?  Birth control/protection: Surgical  ?  Comment: tubal  ? ? ? ? ? ?

## 2021-07-31 ENCOUNTER — Ambulatory Visit (HOSPITAL_COMMUNITY): Payer: Medicare Other

## 2021-07-31 ENCOUNTER — Encounter (HOSPITAL_COMMUNITY): Payer: Self-pay

## 2021-07-31 ENCOUNTER — Encounter (HOSPITAL_BASED_OUTPATIENT_CLINIC_OR_DEPARTMENT_OTHER): Payer: Medicare Other | Admitting: General Surgery

## 2021-07-31 ENCOUNTER — Encounter: Payer: Self-pay | Admitting: Nurse Practitioner

## 2021-07-31 ENCOUNTER — Ambulatory Visit (INDEPENDENT_AMBULATORY_CARE_PROVIDER_SITE_OTHER): Payer: Medicare Other | Admitting: Nurse Practitioner

## 2021-07-31 VITALS — BP 112/74 | HR 83 | Ht 65.0 in | Wt 195.2 lb

## 2021-07-31 DIAGNOSIS — R262 Difficulty in walking, not elsewhere classified: Secondary | ICD-10-CM

## 2021-07-31 DIAGNOSIS — L89322 Pressure ulcer of left buttock, stage 2: Secondary | ICD-10-CM | POA: Diagnosis not present

## 2021-07-31 DIAGNOSIS — E0590 Thyrotoxicosis, unspecified without thyrotoxic crisis or storm: Secondary | ICD-10-CM | POA: Diagnosis not present

## 2021-07-31 DIAGNOSIS — I890 Lymphedema, not elsewhere classified: Secondary | ICD-10-CM | POA: Diagnosis not present

## 2021-07-31 MED ORDER — METHIMAZOLE 5 MG PO TABS: 5.0000 mg | ORAL_TABLET | Freq: Every day | ORAL | 3 refills | Status: AC

## 2021-07-31 NOTE — Progress Notes (Addendum)
? ? ? 07/31/2021   ? ? ?Endocrinology Follow Up Note  ? ? ?Subjective:  ? ? Patient ID: [name] Barnett, female    DOB: 31-Aug-1980, PCP Sitafalwalla, [place], DO. ? ? ?Past Medical History:  ?Diagnosis Date  ? Diabetes mellitus without complication ([place])   ? Hyperlipidemia   ? Hypertension   ? Renal disorder   ? Stroke [facility])   ? Vitamin D deficiency   ? ? ?Past Surgical History:  ?Procedure Laterality Date  ? cardiac stents    ? CATARACT EXTRACTION W/PHACO Left 08/29/2020  ? Procedure: CATARACT EXTRACTION PHACO AND INTRAOCULAR LENS PLACEMENT LEFT EYE;  Surgeon: Baruch Goldmann, MD;  Location: [facility];  Service: Ophthalmology;  Laterality: Left;  left ?CDE=29.30  ? CATARACT EXTRACTION W/PHACO Right 11/28/2020  ? Procedure: CATARACT EXTRACTION PHACO AND INTRAOCULAR LENS PLACEMENT (IOC);  Surgeon: Baruch Goldmann, MD;  Location: [facility];  Service: Ophthalmology;  Laterality: Right;  CDE 4.35  ? COLOSTOMY    ? TUBAL LIGATION    ? wound on buttocks    ? ? ?Social History  ? ?Socioeconomic History  ? Marital status: Legally Separated  ?  Spouse name: Not on file  ? Number of children: Not on file  ? Years of education: Not on file  ? Highest education level: Not on file  ?Occupational History  ? Not on file  ?Tobacco Use  ? Smoking status: Never  ? Smokeless tobacco: Never  ?Vaping Use  ? Vaping Use: Never used  ?Substance and Sexual Activity  ? Alcohol use: Never  ? Drug use: Never  ? Sexual activity: Not Currently  ?  Birth control/protection: Surgical  ?  Comment: tubal  ?Other Topics Concern  ? Not on file  ?Social History Narrative  ? Not on file  ? ?Social Determinants of Health  ? ?Financial Resource Strain: Not on file  ?Food Insecurity: Not on file  ?Transportation Needs: Not on file  ?Physical Activity: Not on file  ?Stress: Not on file  ?Social Connections: Not on file  ? ? ?Family History  ?Problem Relation Age of Onset  ? Cancer Paternal Grandmother   ? Heart disease Father   ? Hypertension Father   ?  Diabetes Father   ? Glaucoma Father   ? Heart disease Mother   ? Hypertension Mother   ? Diabetes Mother   ? Diabetes Sister   ?     borderline  ? Hypertension Sister   ? ? ?Outpatient Encounter Medications as of 07/31/2021  ?Medication Sig  ? acetaminophen (TYLENOL) 325 MG tablet Take 650 mg by mouth every 6 (six) hours as needed.  ? Amino Acids-Protein Hydrolys (PRO-STAT 64 PO) Take by mouth.  ? amLODipine (NORVASC) 5 MG tablet Take 5 mg by mouth See admin instructions. Sun, Mon, Wed and Fri  ? ascorbic acid (VITAMIN C) 500 MG tablet Take 500 mg by mouth 2 (two) times daily.  ? aspirin EC 81 MG tablet Take 81 mg by mouth daily. Swallow whole.  ? atenolol (TENORMIN) 25 MG tablet Take 0.5 tablets (12.5 mg total) by mouth See admin instructions. Every Sun, Mon, Wed and Fri  ? b complex-vitamin c-folic acid (NEPHRO-VITE) 0.8 MG TABS tablet Take 1 tablet by mouth daily.  ? Biotin 5 MG TABS Take 5 mg by mouth in the morning and at bedtime.  ? busPIRone (BUSPAR) 5 MG tablet Take 5 mg by mouth 2 (two) times daily.  ? calcium carbonate (TUMS - DOSED IN MG  ELEMENTAL CALCIUM) 500 MG chewable tablet Chew 2 tablets by mouth 3 (three) times daily before meals.  ? cetaphil (CETAPHIL) lotion Apply 1 application topically 2 (two) times daily as needed for dry skin.  ? Cholecalciferol (VITAMIN D) 50 MCG (2000 UT) CAPS Take 2,000 Units by mouth daily.  ? clindamycin (CLEOCIN T) 1 % lotion Apply topically.  ? desonide (DESOWEN) 0.05 % cream SMARTSIG:2 Topical Twice Daily  ? dicyclomine (BENTYL) 10 MG capsule Take 10 mg by mouth 3 (three) times daily before meals.  ? ferrous sulfate 325 (65 FE) MG tablet Take 325 mg by mouth daily.  ? HUMALOG 100 UNIT/ML injection Inject 5 Units into the skin 3 (three) times daily before meals.  ? insulin glargine (LANTUS SOLOSTAR) 100 UNIT/ML Solostar Pen Inject into the skin.  ? ketoconazole (NIZORAL) 2 % cream SMARTSIG:1 Topical Daily  ? lidocaine-prilocaine (EMLA) cream Apply 1 application  topically daily as needed. Apply to HD graft site right arm topically one every tues, thurs, sat for pain.  ? melatonin 3 MG TABS tablet Take 3 mg by mouth at bedtime.  ? methimazole (TAPAZOLE) 5 MG tablet Take 1 tablet (5 mg total) by mouth daily.  ? metroNIDAZOLE (METROGEL VAGINAL) 0.75 % vaginal gel Use 1 applicator in vagina at bedtime for 5 nights then prn  ? Multiple Vitamins-Minerals (CERTAGEN PO) Take 1 tablet by mouth in the morning.  ? ondansetron (ZOFRAN ODT) 4 MG disintegrating tablet Take 1 tablet (4 mg total) by mouth every 8 (eight) hours as needed for nausea or vomiting.  ? pantoprazole (PROTONIX) 40 MG tablet Take 40 mg by mouth in the morning.  ? polyethylene glycol (MIRALAX / GLYCOLAX) 17 g packet Take 17 g by mouth daily.  ? rOPINIRole (REQUIP) 0.5 MG tablet Take 0.5 mg by mouth at bedtime.  ? rOPINIRole (REQUIP) 2 MG tablet Take 2 mg by mouth at bedtime.  ? SANTYL 250 UNIT/GM ointment Apply 1 application. topically daily.  ? sennosides-docusate sodium (SENOKOT-S) 8.6-50 MG tablet Take 2 tablets by mouth daily.  ? simethicone (MYLICON) 834 MG chewable tablet Chew 125 mg by mouth as needed for flatulence.  ? tamsulosin (FLOMAX) 0.4 MG CAPS capsule Take 1 capsule (0.4 mg total) by mouth daily.  ? thiamine 100 MG tablet Take 100 mg by mouth daily.  ? tiZANidine (ZANAFLEX) 4 MG tablet Take 4 mg by mouth at bedtime.  ? traMADol (ULTRAM) 50 MG tablet Take 100 mg by mouth every 6 (six) hours as needed.  ? zinc sulfate 220 (50 Zn) MG capsule Take 220 mg by mouth daily.  ? atorvastatin (LIPITOR) 80 MG tablet Take 1 tablet by mouth at bedtime. (Patient not taking: Reported on 07/31/2021)  ? Boric Acid Vaginal 600 MG SUPP Place 1 suppository vaginally at bedtime. Place in vagina daily for 7 days (Patient not taking: Reported on 07/24/2021)  ? fosfomycin (MONUROL) 3 g PACK Take by mouth. (Patient not taking: Reported on 07/24/2021)  ? HYDROcodone-acetaminophen (NORCO/VICODIN) 5-325 MG tablet Take 1 tablet by  mouth 2 (two) times daily as needed. (Patient not taking: Reported on 07/24/2021)  ? Lidocaine 4 % PTCH Apply 1 application topically daily at 6 (six) AM. (Patient not taking: Reported on 07/24/2021)  ? NYAMYC powder Apply topically. (Patient not taking: Reported on 07/24/2021)  ? nystatin cream (MYCOSTATIN) Apply topically. (Patient not taking: Reported on 07/24/2021)  ? ?No facility-administered encounter medications on file as of 07/31/2021.  ? ? ?ALLERGIES: ?Allergies  ?Allergen Reactions  ? Corn-Containing  Products   ?  Per MAR  ? Lisinopril   ?  Per MAR  ? Morphine And Related   ?  Per MAR  ? ? ?VACCINATION STATUS: ?Immunization History  ?Administered Date(s) Administered  ? Influenza Whole 04/07/2015  ? Influenza,inj,Quad PF,6+ Mos 02/06/2017, 02/07/2018, 02/05/2019  ? Influenza-Unspecified 07/17/2019, 02/18/2020, 02/21/2021  ? Moderna Sars-Covid-2 Vaccination 01/08/2020  ? Pension scheme manager 45yrs & up 12/18/2020  ? Pneumococcal Conjugate-13 04/13/2018  ? Pneumococcal Polysaccharide-23 10/06/2018  ? Pneumococcal-Unspecified 07/12/2019  ? Unspecified SARS-COV-2 Vaccination 03/21/2020  ? ? ? ?HPI ? ?Phynix Horton is 41 y.o. female who presents today with a medical history as above. she is being seen in follow up after being seen in consultation for hyperthyroidism requested by Sitafalwalla, Sharmin, DO.  she has been dealing with symptoms of intermittent palpitations, tremors, insomnia, and intermittent diarrhea for a few months now. These symptoms are progressively worsening and troubling to her.  her most recent thyroid labs revealed suppressed TSH of 0.198, T4 of 9.4, T3 uptake of 46, and Free Thyroxine Index of 4.3 on 08/14/20. ?she denies dysphagia, choking, shortness of breath, no recent voice change.  ?  ?she reports family history of thyroid dysfunction in her mother (unsure of what kind), but denies family hx of thyroid cancer. she does have personal history of goiter. she is  not on any anti-thyroid medications nor on any thyroid hormone supplements.  she is willing to proceed with appropriate work up and therapy for thyrotoxicosis. ? ?After reviewing her med list from the nursing home,

## 2021-07-31 NOTE — Progress Notes (Signed)
[name], [name] ([id number]) ?Visit Report for 07/31/2021 ?Arrival Information Details ?Patient Name: Date of Service: ?[name] Barnett, [name] Barnett 07/31/2021 7:30 A M ?Medical Record Number: [id number] ?Patient Account Number: [id number] ?Date of Birth/Sex: Treating RN: ?November 24, 1980 (41 y.o. F) Boehlein, Linda ?Primary Care Joenathan Sakuma: SITA FA LWA LLA, SHA RMIN Other Clinician: ?Referring Anyia Gierke: ?Treating Alyus Mofield/Extender: Fredirick Maudlin ?SITA FA LWA LLA, SHA RMIN ?Weeks in Treatment: 30 ?Visit Information History Since Last Visit ?All ordered tests and consults were completed: Yes ?Patient Arrived: Wheel Chair ?Added or deleted any medications: No ?Arrival Time: 07:50 ?Any new allergies or adverse reactions: No ?Accompanied By: self ?Had a fall or experienced change in No ?Transfer Assistance: None ?activities of daily living that may affect ?Patient Identification Verified: Yes ?risk of falls: ?Secondary Verification Process Completed: Yes ?Signs or symptoms of abuse/neglect since last visito No ?Patient Requires Transmission-Based Precautions: No ?Hospitalized since last visit: No ?Patient Has Alerts: No ?Implantable device outside of the clinic excluding No ?cellular tissue based products placed in the center ?since last visit: ?Has Dressing in Place as Prescribed: Yes ?Pain Present Now: Yes ?Electronic Signature(s) ?Signed: 07/31/2021 4:10:53 PM By: [name] RN, BSN ?Entered By: [name] on 07/31/2021 07:51:29 ?-------------------------------------------------------------------------------- ?Clinic Level of Care Assessment Details ?Patient Name: Date of Service: ?[name] Barnett, [name] Barnett 07/31/2021 7:30 A M ?Medical Record Number: [id number] ?Patient Account Number: [id number] ?Date of Birth/Sex: Treating RN: ?1980/08/02 (41 y.o. F) Boehlein, Linda ?Primary Care Joselle Deeds: SITA FA LWA LLA, SHA RMIN Other Clinician: ?Referring Lehman Whiteley: ?Treating Sion Thane/Extender: Fredirick Maudlin ?SITA FA LWA LLA, SHA  RMIN ?Weeks in Treatment: 30 ?Clinic Level of Care Assessment Items ?TOOL 4 Quantity Score ?[]  - 0 ?Use when only an EandM is performed on FOLLOW-UP visit ?ASSESSMENTS - Nursing Assessment / Reassessment ?X- 1 10 ?Reassessment of Co-morbidities (includes updates in patient status) ?X- 1 5 ?Reassessment of Adherence to Treatment Plan ?ASSESSMENTS - Wound and Skin A ssessment / Reassessment ?[]  - 0 ?Simple Wound Assessment / Reassessment - one wound ?X- 2 5 ?Complex Wound Assessment / Reassessment - multiple wounds ?[]  - 0 ?Dermatologic / Skin Assessment (not related to wound area) ?ASSESSMENTS - Focused Assessment ?X- 1 5 ?Circumferential Edema Measurements - multi extremities ?[]  - 0 ?Nutritional Assessment / Counseling / Intervention ?X- 1 5 ?Lower Extremity Assessment (monofilament, tuning fork, pulses) ?[]  - 0 ?Peripheral Arterial Disease Assessment (using hand held doppler) ?ASSESSMENTS - Ostomy and/or Continence Assessment and Care ?[]  - 0 ?Incontinence Assessment and Management ?[]  - 0 ?Ostomy Care Assessment and Management (repouching, etc.) ?PROCESS - Coordination of Care ?X - Simple Patient / Family Education for ongoing care 1 15 ?[]  - 0 ?Complex (extensive) Patient / Family Education for ongoing care ?X- 1 10 ?Staff obtains Consents, Records, T Results / Process Orders ?est ?X- 1 10 ?Staff telephones HHA, Nursing Homes / Clarify orders / etc ?[]  - 0 ?Routine Transfer to another Facility (non-emergent condition) ?[]  - 0 ?Routine Hospital Admission (non-emergent condition) ?[]  - 0 ?New Admissions / Biomedical engineer / Ordering NPWT Apligraf, etc. ?, ?[]  - 0 ?Emergency Hospital Admission (emergent condition) ?X- 1 10 ?Simple Discharge Coordination ?[]  - 0 ?Complex (extensive) Discharge Coordination ?PROCESS - Special Needs ?[]  - 0 ?Pediatric / Minor Patient Management ?[]  - 0 ?Isolation Patient Management ?[]  - 0 ?Hearing / Language / Visual special needs ?[]  - 0 ?Assessment of Community assistance  (transportation, D/C planning, etc.) ?[]  - 0 ?Additional assistance / Altered mentation ?[]  - 0 ?Support Surface(s) Assessment (bed, cushion, seat,  etc.) ?INTERVENTIONS - Wound Cleansing / Measurement ?[]  - 0 ?Simple Wound Cleansing - one wound ?X- 3 5 ?Complex Wound Cleansing - multiple wounds ?X- 1 5 ?Wound Imaging (photographs - any number of wounds) ?[]  - 0 ?Wound Tracing (instead of photographs) ?[]  - 0 ?Simple Wound Measurement - one wound ?X- 3 5 ?Complex Wound Measurement - multiple wounds ?INTERVENTIONS - Wound Dressings ?X - Small Wound Dressing one or multiple wounds 3 10 ?[]  - 0 ?Medium Wound Dressing one or multiple wounds ?[]  - 0 ?Large Wound Dressing one or multiple wounds ?[]  - 0 ?Application of Medications - topical ?[]  - 0 ?Application of Medications - injection ?INTERVENTIONS - Miscellaneous ?[]  - 0 ?External ear exam ?[]  - 0 ?Specimen Collection (cultures, biopsies, blood, body fluids, etc.) ?[]  - 0 ?Specimen(s) / Culture(s) sent or taken to Lab for analysis ?[]  - 0 ?Patient Transfer (multiple staff / Civil Service fast streamer / Similar devices) ?[]  - 0 ?Simple Staple / Suture removal (25 or less) ?[]  - 0 ?Complex Staple / Suture removal (26 or more) ?[]  - 0 ?Hypo / Hyperglycemic Management (close monitor of Blood Glucose) ?[]  - 0 ?Ankle / Brachial Index (ABI) - do not check if billed separately ?X- 1 5 ?Vital Signs ?Has the patient been seen at the hospital within the last three years: Yes ?Total Score: 150 ?Level Of Care: New/Established - Level 4 ?Electronic Signature(s) ?Signed: 07/31/2021 4:10:53 PM By: [name] RN, BSN ?Entered By: [name] on 07/31/2021 08:23:23 ?-------------------------------------------------------------------------------- ?Encounter Discharge Information Details ?Patient Name: Date of Service: ?[name] Barnett, [name] Barnett 07/31/2021 7:30 A M ?Medical Record Number: [id number] ?Patient Account Number: [id number] ?Date of Birth/Sex: Treating RN: ?Nov 28, 1980 (41 y.o. F) Boehlein,  Linda ?Primary Care Satoshi Kalas: SITA FA LWA LLA, SHA RMIN Other Clinician: ?Referring Teja Judice: ?Treating Kazimierz Springborn/Extender: Fredirick Maudlin ?SITA FA LWA LLA, SHA RMIN ?Weeks in Treatment: 30 ?Encounter Discharge Information Items ?Discharge Condition: Stable ?Ambulatory Status: Wheelchair ?Discharge Destination: [place] ?Telephoned: No ?Orders Sent: Yes ?Transportation: Other ?Accompanied By: self ?Schedule Follow-up Appointment: Yes ?Clinical Summary of Care: Patient Declined ?Notes ?facility transportation ?Electronic Signature(s) ?Signed: 07/31/2021 4:10:53 PM By: [name] RN, BSN ?Entered By: [name] on 07/31/2021 08:41:00 ?-------------------------------------------------------------------------------- ?Lower Extremity Assessment Details ?Patient Name: Date of Service: ?[name] Barnett, Maili 07/31/2021 7:30 A M ?Medical Record Number: [id number] ?Patient Account Number: [id number] ?Date of Birth/Sex: Treating RN: ?05/01/1981 (41 y.o. F) Boehlein, Linda ?Primary Care Geselle Cardosa: SITA FA LWA LLA, SHA RMIN Other Clinician: ?Referring Luceal Hollibaugh: ?Treating Zacherie Honeyman/Extender: Fredirick Maudlin ?SITA FA LWA LLA, SHA RMIN ?Weeks in Treatment: 30 ?Edema Assessment ?Assessed: [Left: No] [Right: No] ?Edema: [Left: Ye] [Right: s] ?Calf ?Left: Right: ?Point of Measurement: From Medial Instep 31 cm ?Ankle ?Left: Right: ?Point of Measurement: From Medial Instep 23.5 cm ?Vascular Assessment ?Pulses: ?Dorsalis Pedis ?Palpable: [Left:Yes] ?Electronic Signature(s) ?Signed: 07/31/2021 4:10:53 PM By: [name] RN, BSN ?Entered By: [name] on 07/31/2021 07:57:12 ?-------------------------------------------------------------------------------- ?Multi Wound Chart Details ?Patient Name: ?Date of Service: ?[name] Barnett, Eileen 07/31/2021 7:30 A M ?Medical Record Number: [id number] ?Patient Account Number: [id number] ?Date of Birth/Sex: ?Treating RN: ?08-08-1980 (41 y.o. F) ?Primary Care Arsalan Brisbin:  SITA FA LWA LLA, SHA RMIN ?Other Clinician: ?Referring Ashten Sarnowski: ?Treating Pavan Bring/Extender: Fredirick Maudlin ?SITA FA LWA LLA, SHA RMIN ?Weeks in Treatment: 30 ?Vital Signs ?Height(in): ?Capillary Blood Gl

## 2021-07-31 NOTE — Progress Notes (Signed)
[place], [place] ([id number]) ?Visit Report for 07/31/2021 ?Chief Complaint Document Details ?Patient Name: Date of Service: ?MCNEILL DA NIELS, Ainslie 07/31/2021 7:30 A M ?Medical Record Number: [id number] ?Patient Account Number: [id number] ?Date of Birth/Sex: Treating RN: ?[birth year] (41 y.o. F) ?Primary Care Provider: SITA FA LWA LLA, SHA RMIN Other Clinician: ?Referring Provider: ?Treating Provider/Extender: Fredirick Maudlin ?SITA FA LWA LLA, SHA RMIN ?Weeks in Treatment: 30 ?Information Obtained from: Patient ?Chief Complaint ?06/15/2019; patient is here for review of the wound on her lower sacrum ?12/31/2020; patient is here for review of the wound on her right buttock ?Electronic Signature(s) ?Signed: 07/31/2021 8:26:53 AM By: Fredirick Maudlin MD FACS ?Entered By: Fredirick Maudlin on 07/31/2021 08:26:53 ?-------------------------------------------------------------------------------- ?HPI Details ?Patient Name: Date of Service: ?MCNEILL DA NIELS, Sukhman 07/31/2021 7:30 A M ?Medical Record Number: [id number] ?Patient Account Number: [id number] ?Date of Birth/Sex: Treating RN: ?[birth year] (41 y.o. F) ?Primary Care Provider: SITA FA LWA LLA, SHA RMIN Other Clinician: ?Referring Provider: ?Treating Provider/Extender: Fredirick Maudlin ?SITA FA LWA LLA, SHA RMIN ?Weeks in Treatment: 30 ?History of Present Illness ?HPI Description: Admission ?06/15/2019 ?This is a 41 year old woman who comes to [place] from [facility] skilled facility in [place]. I am able to follow a lot of her history on care ?everywhere in epic. The patient is a type II diabetic and has been on dialysis. 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 I am not exactly sure of the nature of the stroke at this present time. She has made a decent recovery and is now up walking with a ?walker. ?In August 2020 she was admitted to [facility] in [place] with L4-L5 discitis. Culture  and sensitivity showed Klebsiella. She was discharged on 8 ?weeks of ceftazidime. In mid September she is readmitted to the hospital with a pressure ulcer on her lower sacrum and necrotizing fasciitis. She required an ?extensive surgical debridement. CT scan of the pelvis at 1 point showed communication with the rectum. A colostomy was recommended and placed. Culture ?of this area showed Klebsiella and yeast. She was discharged on an extended course of meropenem and Diflucan. She [facility] to [facility] in [place]. We do not have any of these records. I think she was discharged to another nursing home but is come to [facility] skilled facility ?[facility] She was sent down here for review of the remaining wound. By review of the pictures that the patient had from September this is ?made considerable improvement. She tells me that the wound VAC was discontinued at the end of December 2020. Currently the facility is using a form of ?silver alginate to the wound surface. 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 CVA with left hemiparesis in June 2020 but she ?appears to be making a good recovery. Chronic diastolic heart failure, hypertension ?2/19; patient's wound area slightly smaller. The area that was towards her anus has closed over. We are using Santyl covered with Hydrofera Blue. We have ?not been able to get her lab work apparently her veins are very difficult for phlebotomy. They are trying to get the blood work done where she dialyzes in [place] but that requires some logistical issues. ?2/26; not much change in the wound. Still a nonviable surface we have been using Santyl and Hydrofera Blue. I changed her to [facility] only. She tells ?me she has rigorously offloading this  area she also complains of some pain ?3/5; still no change here. I switch to pure Hydrofera Blue. She still  complains of a lot of pain although is a big wound it seems out of proportion to what I might ?expect. This was initially a surgical wound secondary to necrotizing wound infection. She is on dialysis. Other than that she thinks she is offloading this fairly ?rigorously. She has not been systemically unwell ?3/26; I switched to Iodoflex 3 weeks ago. This really seems to have helped, much better surface and slight improvement in the surface area. ?4/26; the patient is still on Iodoflex she missed an appointment 2 weeks ago. Doing slightly smaller. She is on dialysis ?5/10; using Hydrofera Blue. The major improvement here is the complete lack of depth. She has islands of epithelialization ?09/24/19-Patient comes in at 2 weeks, dimensions about the same, there is an [place] of epithelium at margin of wound, no slough noted ?10/15/2019 upon evaluation today patient appears to be doing well with regard to her wound. She has been tolerating the dressing changes in the sacral region ?without complication and at this point she seems to be healing quite nicely. I am extremely pleased with how things are progressing and the epithelial tissue ?seems to be spreading from the central portion as well which is also excellent. 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 We have been using Hydrofera Blue. The smaller area ?which has depth at 0.7 cm there is a question about whether they have been packing anything into this or just flaring Hydrofera Blue over the top ?9/23; its been more than 2 months since this patient was here. She still has a small area distally which is the remanent of her stage IV wound. They have been ?using Hydrofera Blue. She also has an area on the left gluteal ?10/22; 1 month follow-up. I think the original sacral wound is closed however the patient has 2 areas distally 1 over the coccyx a small pinpoint area and the ?other into the gluteal cleft.  Neither 1 of these looks particularly infected. I thought they might be connected but they were not. ?11/15; the original sacral wound is closed patient only has 1 remaining area. 1 of these is in the gluteal cleft just below the coccyx. The area over the coccyx ?from last time I think is all so closed ?12/31/2020; this is a patient that we saw for a pressure ulcer over her lower sacrum and coccyx for a long period of time in 2021. She eventually healed over. ?She is at [facility] skilled facility largely secondary to a remote CVA she had. She is also end-stage renal disease on dialysis secondary to type 2 ?diabetes. She tells [place] that since about mid June she has had an area more distally than the original sacrum/coccyx wound it looks as though they are putting ?silver alginate in this area. She is limited in her ability to offload this I think spending most of her time in a wheelchair although she can stand to transfer. She ?has an ostomy ?During her original stay here in 2021 I also list that she had a more distal area into her perineum that closed over well before the more proximal coccyx/sacral ?wound. She had a CT scan of the abdomen and pelvis on 12/01/2020. This did note first chronic sacral decubitus along the right aspect of her distal sacrum and ?coccyx. There was not felt to be bone  destruction she has chronic changes of bilateral sacroiliitis and chronic endplate destruction and sclerosis at L4-L5 ?secondary to previous osteomyelitis however no new findings were felt to be seen in terms of bone problems in this area ?01/14/2021; 2-week follow-up. Her wound is smaller essentially in the coccyx area. We have been using silver collagen. ?10/5; 3-week follow-up. The patient returns with her area in the coccyx healed however distally in the perineum there is a fairly long stage II area. I am ?assuming this patient's wound is a pressure related area. Although this would not be a usual pressure area for a normal  situation her immobility secondary to ?remote CVA, dialysis etc. make her at risk for pressure areas in an otherwise unusual setting. ?The patient is fairly upset stating that we were told about this last tim

## 2021-07-31 NOTE — Therapy (Signed)
?OUTPATIENT PHYSICAL THERAPY TREATMENT NOTE ? ? ?Patient Name: [name] Barnett ?MRN: [id number] ?DOB:1980-09-17, 41 y.o., female ?Today's Date: 07/31/2021 ? ?PCP: Julianne Handler, DO ?REFERRING PROVIDER: Julianne Handler, DO ? ? PT End of Session - 07/31/21 1842   ? ? Visit Number 6   ? Number of Visits 12   ? Date for PT Re-Evaluation 08/12/21   ? Authorization Type medicare/medicaid 80/20   ? Progress Note Due on Visit 10   ? PT Start Time 9485   ? PT Stop Time 4627   ? PT Time Calculation (min) 46 min   ? Activity Tolerance Patient tolerated treatment well;Patient limited by fatigue   ? Behavior During Therapy [facility] for tasks assessed/performed   ? ?  ?  ? ?  ? ? ? ?Past Medical History:  ?Diagnosis Date  ? Diabetes mellitus without complication ([place])   ? Hyperlipidemia   ? Hypertension   ? Renal disorder   ? Stroke [facility])   ? Vitamin D deficiency   ? ?Past Surgical History:  ?Procedure Laterality Date  ? cardiac stents    ? CATARACT EXTRACTION W/PHACO Left 08/29/2020  ? Procedure: CATARACT EXTRACTION PHACO AND INTRAOCULAR LENS PLACEMENT LEFT EYE;  Surgeon: Baruch Goldmann, MD;  Location: [facility];  Service: Ophthalmology;  Laterality: Left;  left ?CDE=29.30  ? CATARACT EXTRACTION W/PHACO Right 11/28/2020  ? Procedure: CATARACT EXTRACTION PHACO AND INTRAOCULAR LENS PLACEMENT (IOC);  Surgeon: Baruch Goldmann, MD;  Location: [facility];  Service: Ophthalmology;  Laterality: Right;  CDE 4.35  ? COLOSTOMY    ? TUBAL LIGATION    ? wound on buttocks    ? ?Patient Active Problem List  ? Diagnosis Date Noted  ? Chronic kidney disease (CKD) stage G5/A1, glomerular filtration rate (GFR) less than or equal to 15 mL/min/1.73 square meter and albuminuria creatinine ratio less than 30 mg/g (HCC) 06/22/2021  ? Dialysis patient [facility]) 06/22/2021  ? BV (bacterial vaginosis) 04/02/2020  ? Vaginal itching 04/02/2020  ? Vaginal odor 04/02/2020  ? Vaginal discharge 04/02/2020  ? Sepsis secondary to UTI ([place]) 01/13/2020  ? Acute  respiratory failure with hypoxia ([place]) 01/13/2020  ? Right leg swelling 01/13/2020  ? Sacral decubitus ulcer 01/13/2020  ? Anemia due to end stage renal disease ([place]) 01/13/2020  ? Essential hypertension 01/13/2020  ? Hyperlipidemia 01/13/2020  ? Type 2 diabetes mellitus ([place]) 01/13/2020  ? ? ?REFERRING DIAG: Lymphedema ? ?THERAPY DIAG:  ?Difficulty in walking, not elsewhere classified ? ?Lymphedema, not elsewhere classified ? ?PERTINENT HISTORY: Lymphedema  ? ?PRECAUTIONS: Other: cellulitis   ? ?SUBJECTIVE: Pt stated she has been busy today, this was her 3rd apt of the day.  Reports she has received compression socks, have not received pump yet and plans to discuss with facility later today.  Did wash short stretch bandages prior today's session. ? ?PAIN:  ?Are you having pain? Yes  ?Buttocks and both feet ache.  Pain comes and goes.  ? ?Treatment:   ?07/31/21 ? ? 07/31/21 0001  ?Ambulation/Gait  ?Ambulation/Gait Yes  ?Ambulation/Gait Assistance 4: Min guard  ?Ambulation Distance (Feet) 72 Feet ?(4 times with seated rest breaks between)  ?Assistive device Rolling walker  ?Gait Pattern Step-to pattern;Decreased step length - right;Decreased step length - left;Decreased stride length;Decreased hip/knee flexion - right;Decreased hip/knee flexion - left;Decreased weight shift to right;Right flexed knee in stance;Left flexed knee in stance;Antalgic  ?Manual Therapy  ?Manual Therapy Manual Lymphatic Drainage (MLD);Compression Bandaging  ?Manual therapy comments done seperate from all other aspects of  treatment  ?Manual Lymphatic Drainage (MLD) To include supraclavicular, deep and superfical abdominal, Rt inguinal axillary and intrainguinal anastomosis as well as anterior and posterior RT LE ; posterior completed semiprone.  ?Compression Bandaging Foam and multilayer short stretch bandages from foot to knee of RT LE  ?Other Manual Therapy Measurement  ? ? ? ?07/27/2021 ?                        ?   ?Ambulation/Gait   ?Ambulation/Gait Yes  ?Ambulation/Gait Assistance 4: Min guard  ?Ambulation Distance (Feet) 60 Feet , pt states she is having pain in her RT LE   ?Assistive device Rolling walker  ?Gait Pattern Step-to pattern;Decreased step length - right;Decreased step length - left;Decreased stride length;Decreased hip/knee flexion - right;Decreased hip/knee flexion - left;Decreased weight shift to right;Right flexed knee in stance;Left flexed knee in stance;Antalgic  ?Manual Therapy  ?Manual Therapy Manual Lymphatic Drainage (MLD);Compression Bandaging  ?Manual therapy comments done seperate from all other aspects of treatment  ?Manual Lymphatic Drainage (MLD) To include supraclavicular, deep and superfical abdominal, Rt inguinal axillary and intrainguinal anastomosis as well as anterior and posterior RT LE ; posterior completed semiprone.  ?Compression Bandaging Foam and multilayer short stretch bandages from foot to knee of RT LE  ? ? ? ? ?07/24/2021 ? ?LYMPHEDEMA ASSESSMENTS:  ?  ?  ?  ?LE LANDMARK RIGHT ?07/13/2021        07/24/21 07/31/21  ?At groin      ?30 cm proximal to suprapatella      ?20 cm proximal to suprapatella 62.5                    ?  ?     ?10 cm proximal to suprapatella 56    ?At midpatella / popliteal crease 41.2    ?30 cm proximal to floor at lateral plantar foot 40.1                  39.2 37.8  ?20 cm proximal to floor at lateral plantar foot 38.6                 33.5 29.8  ?10 cm proximal to floor at lateral plantar foot 30.3                  30.3 25.5  ?Circumference of ankle/heel 34                      33.7 32.6  ?5 cm proximal to 1st MTP joint 26                     25.5 24.3  ?Across MTP joint 26                      25.4 24  ?Around proximal great toe      ?weight   199#  ? ?(Blank rows = not tested) ?  ?LE LANDMARK LEFT ?07/13/2021  ?At groin    ?30 cm proximal to suprapatella    ?20 cm proximal to suprapatella 59.8  ?10 cm proximal to suprapatella 51  ?At [place] / popliteal crease 41.8  ?30 cm  proximal to floor at lateral plantar foot 34.4  ?20 cm proximal to floor at lateral plantar foot 30.2  ?10 cm proximal to floor at lateral plantar foot 26.5  ?Circumference of ankle/heel 33.2  ?5 cm proximal to 1st  MTP joint 25  ?Across MTP joint 25.8  ?Around proximal great toe    ?(Blank rows = not tested) ?  ? ? ? ?   ?Ambulation/Gait  ?Ambulation/Gait Yes  ?Ambulation/Gait Assistance 4: Min guard  ?Ambulation Distance (Feet) 64 Feet  ?Assistive device Rolling walker  ?Gait Pattern Step-to pattern;Decreased step length - right;Decreased step length - left;Decreased stride length;Decreased hip/knee flexion - right;Decreased hip/knee flexion - left;Decreased weight shift to right;Right flexed knee in stance;Left flexed knee in stance;Antalgic  ?Manual Therapy  ?Manual Therapy Manual Lymphatic Drainage (MLD);Compression Bandaging  ?Manual therapy comments done seperate from all other aspects of treatment  ?Manual Lymphatic Drainage (MLD) To include supraclavicular, deep and superfical abdominal, Rt inguinal axillary and intrainguinal anastomosis as well as anterior and posterior RT LE ; posterior completed semiprone.  ?Compression Bandaging Foam and multilayer short stretch bandages from foot to knee of RT LE  ? ? ?PATIENT EDUCATION: ?Education details: Reviewed importance of HEP, bandage wear time, given paperwork for facility to purchase pump ?Person educated: Patient ?Education method: Explanation ?Education comprehension: verbalized understanding ? ? ?HOME EXERCISE PROGRAM: ?Ankle pumps, LAQ, hip ab/adduction, marching, diaphragm breathing, lymph squeeze   ? ?               ?ASSESSMENT: ?  ?CLINICAL IMPRESSION:  Measurements taken today with noted reduction Rt LE.  Pt stated she has received compression garment, has not received pump though stated she plans on asking facility later today.  Encouraged pt to bring in compression socks with her next session to begin donning training.   ?  ?OBJECTIVE IMPAIRMENTS  decreased ROM and increased edema.  ?  ?ACTIVITY LIMITATIONS  mobility  .  ?  ?PERSONAL FACTORS 1-2 comorbidities: CVA, sacral decubiti,   are also affecting patient's functional outcome.  ?  ?  ?REHAB POTENTIAL: Good

## 2021-08-04 ENCOUNTER — Encounter (HOSPITAL_COMMUNITY): Payer: Medicare Other | Admitting: Physical Therapy

## 2021-08-05 ENCOUNTER — Ambulatory Visit (HOSPITAL_COMMUNITY): Payer: No Typology Code available for payment source | Attending: Emergency Medicine | Admitting: Physical Therapy

## 2021-08-05 DIAGNOSIS — I890 Lymphedema, not elsewhere classified: Secondary | ICD-10-CM | POA: Insufficient documentation

## 2021-08-05 DIAGNOSIS — R262 Difficulty in walking, not elsewhere classified: Secondary | ICD-10-CM | POA: Insufficient documentation

## 2021-08-05 NOTE — Therapy (Signed)
?OUTPATIENT PHYSICAL THERAPY TREATMENT NOTE ? ? ?Patient Name: [name] Barnett ?MRN: [id number] ?DOB:04-27-1981, 41 y.o., female ?Today's Date: 08/05/2021 ? ?PCP: Julianne Handler, DO ?REFERRING PROVIDER: Sitafalwalla, Sharmin, DO ? ? PT End of Session - 08/05/21 1011   ? ? Visit Number 7   ? Number of Visits 12   ? Date for PT Re-Evaluation 08/12/21   ? Authorization Type medicare/medicaid 80/20   ? Progress Note Due on Visit 10   ? PT Start Time 0830   ? PT Stop Time 0925   ? PT Time Calculation (min) 55 min   ? Activity Tolerance Patient tolerated treatment well;Patient limited by fatigue   ? Behavior During Therapy [facility] for tasks assessed/performed   ? ?  ?  ? ?  ? ? ? ?Past Medical History:  ?Diagnosis Date  ? Diabetes mellitus without complication ([place])   ? Hyperlipidemia   ? Hypertension   ? Renal disorder   ? Stroke [facility])   ? Vitamin D deficiency   ? ?Past Surgical History:  ?Procedure Laterality Date  ? cardiac stents    ? CATARACT EXTRACTION W/PHACO Left 08/29/2020  ? Procedure: CATARACT EXTRACTION PHACO AND INTRAOCULAR LENS PLACEMENT LEFT EYE;  Surgeon: Baruch Goldmann, MD;  Location: [facility];  Service: Ophthalmology;  Laterality: Left;  left ?CDE=29.30  ? CATARACT EXTRACTION W/PHACO Right 11/28/2020  ? Procedure: CATARACT EXTRACTION PHACO AND INTRAOCULAR LENS PLACEMENT (IOC);  Surgeon: Baruch Goldmann, MD;  Location: [facility];  Service: Ophthalmology;  Laterality: Right;  CDE 4.35  ? COLOSTOMY    ? TUBAL LIGATION    ? wound on buttocks    ? ?Patient Active Problem List  ? Diagnosis Date Noted  ? Chronic kidney disease (CKD) stage G5/A1, glomerular filtration rate (GFR) less than or equal to 15 mL/min/1.73 square meter and albuminuria creatinine ratio less than 30 mg/g (HCC) 06/22/2021  ? Dialysis patient [facility]) 06/22/2021  ? BV (bacterial vaginosis) 04/02/2020  ? Vaginal itching 04/02/2020  ? Vaginal odor 04/02/2020  ? Vaginal discharge 04/02/2020  ? Sepsis secondary to UTI ([place]) 01/13/2020  ? Acute  respiratory failure with hypoxia ([place]) 01/13/2020  ? Right leg swelling 01/13/2020  ? Sacral decubitus ulcer 01/13/2020  ? Anemia due to end stage renal disease ([place]) 01/13/2020  ? Essential hypertension 01/13/2020  ? Hyperlipidemia 01/13/2020  ? Type 2 diabetes mellitus ([place]) 01/13/2020  ? ? ?REFERRING DIAG: Lymphedema ? ?THERAPY DIAG:  ?Difficulty in walking, not elsewhere classified ? ?Lymphedema, not elsewhere classified ? ?PERTINENT HISTORY: Lymphedema  ? ?PRECAUTIONS: Other: cellulitis   ? ?SUBJECTIVE: Pt states she has not heard anything on getting her pump yet from the facility.  Reports no issues today.  Continues to wear her compression stocking on her Lt LE.  Forgot to bring her other stocking today. ? ?PAIN:  ?Are you having pain? Yes  ?Buttocks and both feet ache.  Pain comes and goes.  ? ?Treatment:   ?08/05/21 ?Ambulation/Gait  ?Ambulation/Gait Yes  ?Ambulation/Gait Assistance 4: Min guard  ?Ambulation Distance (Feet) 90 Feet ?(2 bouts, 30' and 60' with seated rest break between)  ?Assistive device Rolling walker  ?Gait Pattern Step-to pattern;Decreased step length - right;Decreased step length - left;Decreased stride length;Decreased hip/knee flexion - right;Decreased hip/knee flexion - left;Decreased weight shift to right;Right flexed knee in stance;Left flexed knee in stance;Antalgic  ?Manual Therapy  ?Manual Therapy Manual Lymphatic Drainage (MLD);Compression Bandaging  ?Manual therapy comments done seperate from all other aspects of treatment  ?Manual Lymphatic Drainage (MLD) To  include supraclavicular, deep and superfical abdominal, Rt inguinal axillary and intrainguinal anastomosis as well as anterior and posterior RT LE ; posterior completed semiprone.  ?Compression Bandaging Foam and multilayer short stretch bandages from foot to knee of RT LE  ? ? ?07/31/21 ? 07/31/21 0001  ?Ambulation/Gait  ?Ambulation/Gait Yes  ?Ambulation/Gait Assistance 4: Min guard  ?Ambulation Distance (Feet) 72 Feet ?(4  times with seated rest breaks between)  ?Assistive device Rolling walker  ?Gait Pattern Step-to pattern;Decreased step length - right;Decreased step length - left;Decreased stride length;Decreased hip/knee flexion - right;Decreased hip/knee flexion - left;Decreased weight shift to right;Right flexed knee in stance;Left flexed knee in stance;Antalgic  ?Manual Therapy  ?Manual Therapy Manual Lymphatic Drainage (MLD);Compression Bandaging  ?Manual therapy comments done seperate from all other aspects of treatment  ?Manual Lymphatic Drainage (MLD) To include supraclavicular, deep and superfical abdominal, Rt inguinal axillary and intrainguinal anastomosis as well as anterior and posterior RT LE ; posterior completed semiprone.  ?Compression Bandaging Foam and multilayer short stretch bandages from foot to knee of RT LE  ?Other Manual Therapy Measurement  ? ? ? ?07/27/2021 ?                        ?   ?Ambulation/Gait  ?Ambulation/Gait Yes  ?Ambulation/Gait Assistance 4: Min guard  ?Ambulation Distance (Feet) 60 Feet , pt states she is having pain in her RT LE   ?Assistive device Rolling walker  ?Gait Pattern Step-to pattern;Decreased step length - right;Decreased step length - left;Decreased stride length;Decreased hip/knee flexion - right;Decreased hip/knee flexion - left;Decreased weight shift to right;Right flexed knee in stance;Left flexed knee in stance;Antalgic  ?Manual Therapy  ?Manual Therapy Manual Lymphatic Drainage (MLD);Compression Bandaging  ?Manual therapy comments done seperate from all other aspects of treatment  ?Manual Lymphatic Drainage (MLD) To include supraclavicular, deep and superfical abdominal, Rt inguinal axillary and intrainguinal anastomosis as well as anterior and posterior RT LE ; posterior completed semiprone.  ?Compression Bandaging Foam and multilayer short stretch bandages from foot to knee of RT LE  ? ? ? ? ?07/24/2021 ? ?LYMPHEDEMA ASSESSMENTS:  ?  ?  ?  ?LE LANDMARK RIGHT ?07/13/2021         07/24/21 07/31/21  ?At groin      ?30 cm proximal to suprapatella      ?20 cm proximal to suprapatella 62.5                    ?  ?     ?10 cm proximal to suprapatella 56    ?At midpatella / popliteal crease 41.2    ?30 cm proximal to floor at lateral plantar foot 40.1                  39.2 37.8  ?20 cm proximal to floor at lateral plantar foot 38.6                 33.5 29.8  ?10 cm proximal to floor at lateral plantar foot 30.3                  30.3 25.5  ?Circumference of ankle/heel 34                      33.7 32.6  ?5 cm proximal to 1st MTP joint 26                     25.5 24.3  ?  Across MTP joint 26                      25.4 24  ?Around proximal great toe      ?weight   199#  ? ?(Blank rows = not tested) ?  ?LE LANDMARK LEFT ?07/13/2021  ?At groin    ?30 cm proximal to suprapatella    ?20 cm proximal to suprapatella 59.8  ?10 cm proximal to suprapatella 51  ?At [place] / popliteal crease 41.8  ?30 cm proximal to floor at lateral plantar foot 34.4  ?20 cm proximal to floor at lateral plantar foot 30.2  ?10 cm proximal to floor at lateral plantar foot 26.5  ?Circumference of ankle/heel 33.2  ?5 cm proximal to 1st MTP joint 25  ?Across MTP joint 25.8  ?Around proximal great toe    ?(Blank rows = not tested) ?  ? ? ? ?   ?Ambulation/Gait  ?Ambulation/Gait Yes  ?Ambulation/Gait Assistance 4: Min guard  ?Ambulation Distance (Feet) 64 Feet  ?Assistive device Rolling walker  ?Gait Pattern Step-to pattern;Decreased step length - right;Decreased step length - left;Decreased stride length;Decreased hip/knee flexion - right;Decreased hip/knee flexion - left;Decreased weight shift to right;Right flexed knee in stance;Left flexed knee in stance;Antalgic  ?Manual Therapy  ?Manual Therapy Manual Lymphatic Drainage (MLD);Compression Bandaging  ?Manual therapy comments done seperate from all other aspects of treatment  ?Manual Lymphatic Drainage (MLD) To include supraclavicular, deep and superfical abdominal, Rt inguinal  axillary and intrainguinal anastomosis as well as anterior and posterior RT LE ; posterior completed semiprone.  ?Compression Bandaging Foam and multilayer short stretch bandages from foot to knee of RT LE

## 2021-08-07 ENCOUNTER — Ambulatory Visit (HOSPITAL_COMMUNITY): Payer: No Typology Code available for payment source | Admitting: Physical Therapy

## 2021-08-07 ENCOUNTER — Encounter (HOSPITAL_COMMUNITY): Payer: Self-pay | Admitting: Physical Therapy

## 2021-08-07 DIAGNOSIS — I890 Lymphedema, not elsewhere classified: Secondary | ICD-10-CM

## 2021-08-07 DIAGNOSIS — R262 Difficulty in walking, not elsewhere classified: Secondary | ICD-10-CM

## 2021-08-07 NOTE — Therapy (Signed)
?OUTPATIENT PHYSICAL THERAPY TREATMENT NOTE ? ? ?Patient Name: [name] Barnett ?MRN: [id number] ?DOB:1980/11/19, 41 y.o., female ?Today's Date: 08/07/2021 ? ?PCP: Julianne Handler, DO ?REFERRING PROVIDER: Sitafalwalla, Sharmin, DO ?PHYSICAL THERAPY DISCHARGE SUMMARY ? ?Visits from Start of Care: 8 ? ?Current functional level related to goals / functional outcomes: ?Edema decreased to previous D/C level, has compression garments  ?  ?Remaining deficits: ?none ?  ?Education / Equipment: ?HEP  ? ?Patient agrees to discharge. Patient goals were met. Patient is being discharged due to meeting the stated rehab goals.  ? ? PT End of Session - 08/07/21 1310   ? ? Visit Number 8   ? Number of Visits 8  ? Date for PT Re-Evaluation 08/12/21   ? Authorization Type medicare/medicaid 80/20   ? Progress Note Due on Visit 10   ? PT Start Time 1315   ? PT Stop Time 1400   ? PT Time Calculation (min) 45 min   ? Activity Tolerance Patient tolerated treatment well;Patient limited by fatigue   ? Behavior During Therapy [facility] for tasks assessed/performed   ? ?  ?  ? ?  ? ? ? ?Past Medical History:  ?Diagnosis Date  ? Diabetes mellitus without complication ([place])   ? Hyperlipidemia   ? Hypertension   ? Renal disorder   ? Stroke [facility])   ? Vitamin D deficiency   ? ?Past Surgical History:  ?Procedure Laterality Date  ? cardiac stents    ? CATARACT EXTRACTION W/PHACO Left 08/29/2020  ? Procedure: CATARACT EXTRACTION PHACO AND INTRAOCULAR LENS PLACEMENT LEFT EYE;  Surgeon: Baruch Goldmann, MD;  Location: [facility];  Service: Ophthalmology;  Laterality: Left;  left ?CDE=29.30  ? CATARACT EXTRACTION W/PHACO Right 11/28/2020  ? Procedure: CATARACT EXTRACTION PHACO AND INTRAOCULAR LENS PLACEMENT (IOC);  Surgeon: Baruch Goldmann, MD;  Location: [facility];  Service: Ophthalmology;  Laterality: Right;  CDE 4.35  ? COLOSTOMY    ? TUBAL LIGATION    ? wound on buttocks    ? ?Patient Active Problem List  ? Diagnosis Date Noted  ? Chronic kidney disease (CKD)  stage G5/A1, glomerular filtration rate (GFR) less than or equal to 15 mL/min/1.73 square meter and albuminuria creatinine ratio less than 30 mg/g (HCC) 06/22/2021  ? Dialysis patient [facility]) 06/22/2021  ? BV (bacterial vaginosis) 04/02/2020  ? Vaginal itching 04/02/2020  ? Vaginal odor 04/02/2020  ? Vaginal discharge 04/02/2020  ? Sepsis secondary to UTI ([place]) 01/13/2020  ? Acute respiratory failure with hypoxia ([place]) 01/13/2020  ? Right leg swelling 01/13/2020  ? Sacral decubitus ulcer 01/13/2020  ? Anemia due to end stage renal disease ([place]) 01/13/2020  ? Essential hypertension 01/13/2020  ? Hyperlipidemia 01/13/2020  ? Type 2 diabetes mellitus ([place]) 01/13/2020  ? ? ?REFERRING DIAG: Lymphedema ? ?THERAPY DIAG:  ?Difficulty in walking, not elsewhere classified ? ?Lymphedema, not elsewhere classified ? ?PERTINENT HISTORY: Lymphedema  ? ?PRECAUTIONS: Other: cellulitis   ? ?SUBJECTIVE: Pt states she has not heard anything on getting her pump yet from the facility.  Reports no issues today.  Continues to wear her compression stocking on her Lt LE.  Forgot to bring her other stocking today. ? ?PAIN:  ?Are you having pain? Yes  ?Buttocks and both feet ache.  Pain comes and goes.  ? ?Treatment:   ?08/07/2021 ?Pt ambulated with rolling walker with min guard assist x 60' ?             ?Manual Therapy  ?Manual Therapy Manual Lymphatic Drainage (  MLD);  ?Manual therapy comments done seperate from all other aspects of treatment  ?Manual Lymphatic Drainage (MLD) To include supraclavicular, deep and superfical abdominal, Rt inguinal axillary and intrainguinal anastomosis as well as anterior and posterior RT LE ; posterior completed semiprone.  ?            Measurement.  ?08/05/21 ?Ambulation/Gait  ?Ambulation/Gait Yes  ?Ambulation/Gait Assistance 4: Min guard  ?Ambulation Distance (Feet) 90 Feet ?(2 bouts, 30' and 60' with seated rest break between)  ?Assistive device Rolling walker  ?Gait Pattern Step-to pattern;Decreased step length  - right;Decreased step length - left;Decreased stride length;Decreased hip/knee flexion - right;Decreased hip/knee flexion - left;Decreased weight shift to right;Right flexed knee in stance;Left flexed knee in stance;Antalgic  ?Manual Therapy  ?Manual Therapy Manual Lymphatic Drainage (MLD);Compression Bandaging  ?Manual therapy comments done seperate from all other aspects of treatment  ?Manual Lymphatic Drainage (MLD) To include supraclavicular, deep and superfical abdominal, Rt inguinal axillary and intrainguinal anastomosis as well as anterior and posterior RT LE ; posterior completed semiprone.  ?Compression Bandaging Foam and multilayer short stretch bandages from foot to knee of RT LE  ? ? ?07/31/21 ? 07/31/21 0001  ?Ambulation/Gait  ?Ambulation/Gait Yes  ?Ambulation/Gait Assistance 4: Min guard  ?Ambulation Distance (Feet) 72 Feet ?(4 times with seated rest breaks between)  ?Assistive device Rolling walker  ?Gait Pattern Step-to pattern;Decreased step length - right;Decreased step length - left;Decreased stride length;Decreased hip/knee flexion - right;Decreased hip/knee flexion - left;Decreased weight shift to right;Right flexed knee in stance;Left flexed knee in stance;Antalgic  ?Manual Therapy  ?Manual Therapy Manual Lymphatic Drainage (MLD);Compression Bandaging  ?Manual therapy comments done seperate from all other aspects of treatment  ?Manual Lymphatic Drainage (MLD) To include supraclavicular, deep and superfical abdominal, Rt inguinal axillary and intrainguinal anastomosis as well as anterior and posterior RT LE ; posterior completed semiprone.  ?Compression Bandaging Foam and multilayer short stretch bandages from foot to knee of RT LE  ?Other Manual Therapy Measurement  ? ? ? ?07/27/2021 ?                        ?   ?Ambulation/Gait  ?Ambulation/Gait Yes  ?Ambulation/Gait Assistance 4: Min guard  ?Ambulation Distance (Feet) 60 Feet , pt states she is having pain in her RT LE   ?Assistive device  Rolling walker  ?Gait Pattern Step-to pattern;Decreased step length - right;Decreased step length - left;Decreased stride length;Decreased hip/knee flexion - right;Decreased hip/knee flexion - left;Decreased weight shift to right;Right flexed knee in stance;Left flexed knee in stance;Antalgic  ?Manual Therapy  ?Manual Therapy Manual Lymphatic Drainage (MLD);Compression Bandaging  ?Manual therapy comments done seperate from all other aspects of treatment  ?Manual Lymphatic Drainage (MLD) To include supraclavicular, deep and superfical abdominal, Rt inguinal axillary and intrainguinal anastomosis as well as anterior and posterior RT LE ; posterior completed semiprone.  ?Compression Bandaging Foam and multilayer short stretch bandages from foot to knee of RT LE  ? ? ? ? ?07/24/2021 ? ?LYMPHEDEMA ASSESSMENTS:  ?  ?  ?  ?LE LANDMARK RIGHT ?07/13/2021        07/24/21 07/31/21 08/06/2021  ?At groin       ?30 cm proximal to suprapatella       ?20 cm proximal to suprapatella 62.5                    ?  ?      ?10 cm proximal to suprapatella 56     ?  At midpatella / popliteal crease 41.2     ?30 cm proximal to floor at lateral plantar foot 40.1                  39.2 37.8 36  ?20 cm proximal to floor at lateral plantar foot 38.6                 33.5 29.8 29.8 ?  ?10 cm proximal to floor at lateral plantar foot 30.3                  30.3 25.5 26  ?Circumference of ankle/heel 34                      33.7 32.6 33  ?5 cm proximal to 1st MTP joint 26                     25.5 24.3 24.3  ?Across MTP joint 26                      25._0 ?Around proximal great toe       ?weight   199#   ? ?(Blank rows = not tested) ?  ?LE LANDMARK LEFT ?07/13/2021  ?At groin    ?30 cm proximal to suprapatella    ?20 cm proximal to suprapatella 59.8  ?10 cm proximal to suprapatella 51  ?At [place] / popliteal crease 41.8  ?30 cm proximal to floor at lateral plantar foot 34.4  ?20 cm proximal to floor at lateral plantar foot 30.2  ?10 cm proximal to floor  at lateral plantar foot 26.5  ?Circumference of ankle/heel 33.2  ?5 cm proximal to 1st MTP joint 25  ?Across MTP joint 25.8  ?Around proximal great toe    ?(Blank rows = not tested) ?  ? ? ? ?   ?Ambula

## 2021-08-12 ENCOUNTER — Ambulatory Visit (HOSPITAL_COMMUNITY): Payer: No Typology Code available for payment source | Admitting: Physical Therapy

## 2021-08-12 ENCOUNTER — Ambulatory Visit: Payer: Medicare Other | Admitting: Specialist

## 2021-08-13 ENCOUNTER — Encounter (HOSPITAL_COMMUNITY): Payer: Medicare Other | Admitting: Physical Therapy

## 2021-08-13 ENCOUNTER — Telehealth: Payer: Self-pay | Admitting: Specialist

## 2021-08-13 NOTE — Telephone Encounter (Signed)
Received a vm from Alvino Chapel w/ [facility]. She requesting ov note from 08/12/21. IC,lmvm advised patient was not seen yesterday, advised pts appt is 08/28/21. Ph [phone] ?

## 2021-08-14 ENCOUNTER — Ambulatory Visit (HOSPITAL_COMMUNITY)
Admission: RE | Admit: 2021-08-14 | Discharge: 2021-08-14 | Disposition: A | Discharge status: Home / Self Care | Payer: Medicare Other | Source: Ambulatory Visit | Attending: Ophthalmology | Admitting: Ophthalmology

## 2021-08-14 ENCOUNTER — Encounter (HOSPITAL_BASED_OUTPATIENT_CLINIC_OR_DEPARTMENT_OTHER): Payer: Medicare Other | Attending: General Surgery | Admitting: General Surgery

## 2021-08-14 ENCOUNTER — Ambulatory Visit (HOSPITAL_COMMUNITY)
Admission: RE | Admit: 2021-08-14 | Discharge: 2021-08-14 | Disposition: A | Discharge status: Home / Self Care | Payer: Medicare Other | Source: Ambulatory Visit | Attending: Internal Medicine | Admitting: Internal Medicine

## 2021-08-14 DIAGNOSIS — I5032 Chronic diastolic (congestive) heart failure: Secondary | ICD-10-CM | POA: Diagnosis not present

## 2021-08-14 DIAGNOSIS — S91115A Laceration without foreign body of left lesser toe(s) without damage to nail, initial encounter: Secondary | ICD-10-CM | POA: Diagnosis not present

## 2021-08-14 DIAGNOSIS — X58XXXA Exposure to other specified factors, initial encounter: Secondary | ICD-10-CM | POA: Diagnosis not present

## 2021-08-14 DIAGNOSIS — G529 Cranial nerve disorder, unspecified: Secondary | ICD-10-CM | POA: Diagnosis not present

## 2021-08-14 DIAGNOSIS — L89322 Pressure ulcer of left buttock, stage 2: Secondary | ICD-10-CM | POA: Diagnosis not present

## 2021-08-14 DIAGNOSIS — L89152 Pressure ulcer of sacral region, stage 2: Secondary | ICD-10-CM | POA: Insufficient documentation

## 2021-08-14 DIAGNOSIS — S91309A Unspecified open wound, unspecified foot, initial encounter: Secondary | ICD-10-CM

## 2021-08-14 DIAGNOSIS — G8222 Paraplegia, incomplete: Secondary | ICD-10-CM | POA: Diagnosis not present

## 2021-08-14 DIAGNOSIS — E1122 Type 2 diabetes mellitus with diabetic chronic kidney disease: Secondary | ICD-10-CM | POA: Diagnosis not present

## 2021-08-14 DIAGNOSIS — I69354 Hemiplegia and hemiparesis following cerebral infarction affecting left non-dominant side: Secondary | ICD-10-CM | POA: Diagnosis not present

## 2021-08-14 DIAGNOSIS — N186 End stage renal disease: Secondary | ICD-10-CM | POA: Diagnosis not present

## 2021-08-14 DIAGNOSIS — I132 Hypertensive heart and chronic kidney disease with heart failure and with stage 5 chronic kidney disease, or end stage renal disease: Secondary | ICD-10-CM | POA: Insufficient documentation

## 2021-08-14 DIAGNOSIS — Z992 Dependence on renal dialysis: Secondary | ICD-10-CM | POA: Diagnosis not present

## 2021-08-14 MED ORDER — GADOBUTROL 1 MMOL/ML IV SOLN
8.0000 mL | Freq: Once | INTRAVENOUS | Status: AC | PRN
  Administered 2021-08-14: 8 mL via INTRAVENOUS

## 2021-08-14 NOTE — Progress Notes (Signed)
[place] ([id number]) ?Visit Report for 08/14/2021 ?Arrival Information Details ?Patient Name: Date of Service: ?MCNEILL DA NIELS, Amery 08/14/2021 2:00 PM ?Medical Record Number: [id number] ?Patient Account Number: [id number] ?Date of Birth/Sex: Treating RN: ?12-10-1980 (41 y.o. F) Boehlein, Linda ?Primary Care Angeleen Horney: SITA FA LWA LLA, SHA RMIN Other Clinician: ?Referring Thersa Mohiuddin: ?Treating Rheya Minogue/Extender: Fredirick Maudlin ?SITA FA LWA LLA, SHA RMIN ?Weeks in Treatment: 32 ?Visit Information History Since Last Visit ?Added or deleted any medications: No ?Patient Arrived: Wheel Chair ?Any new allergies or adverse reactions: No ?Arrival Time: 14:23 ?Had a fall or experienced change in No ?Accompanied By: self ?activities of daily living that may affect ?Transfer Assistance: Manual ?risk of falls: ?Patient Identification Verified: Yes ?Signs or symptoms of abuse/neglect since last visito No ?Secondary Verification Process Completed: Yes ?Hospitalized since last visit: No ?Patient Requires Transmission-Based Precautions: No ?Implantable device outside of the clinic excluding No ?Patient Has Alerts: No ?cellular tissue based products placed in the center ?since last visit: ?Has Dressing in Place as Prescribed: Yes ?Pain Present Now: Yes ?Electronic Signature(s) ?Signed: 08/14/2021 2:44:40 PM By: Sandre Kitty ?Entered By: Sandre Kitty on 08/14/2021 14:24:26 ?-------------------------------------------------------------------------------- ?Clinic Level of Care Assessment Details ?Patient Name: Date of Service: ?MCNEILL DA NIELS, Anjelika 08/14/2021 2:00 PM ?Medical Record Number: [id number] ?Patient Account Number: [id number] ?Date of Birth/Sex: Treating RN: ?May 19, 1980 (41 y.o. F) Boehlein, Linda ?Primary Care Yates Weisgerber: SITA FA LWA LLA, SHA RMIN Other Clinician: ?Referring Corky Blumstein: ?Treating Demitrius Crass/Extender: Fredirick Maudlin ?SITA FA LWA LLA, SHA RMIN ?Weeks in Treatment: 32 ?Clinic Level of Care  Assessment Items ?TOOL 4 Quantity Score ?[]  - 0 ?Use when only an EandM is performed on FOLLOW-UP visit ?ASSESSMENTS - Nursing Assessment / Reassessment ?X- 1 10 ?Reassessment of Co-morbidities (includes updates in patient status) ?X- 1 5 ?Reassessment of Adherence to Treatment Plan ?ASSESSMENTS - Wound and Skin A ssessment / Reassessment ?[]  - 0 ?Simple Wound Assessment / Reassessment - one wound ?X- 3 5 ?Complex Wound Assessment / Reassessment - multiple wounds ?[]  - 0 ?Dermatologic / Skin Assessment (not related to wound area) ?ASSESSMENTS - Focused Assessment ?[]  - 0 ?Circumferential Edema Measurements - multi extremities ?[]  - 0 ?Nutritional Assessment / Counseling / Intervention ?X- 1 5 ?Lower Extremity Assessment (monofilament, tuning fork, pulses) ?[]  - 0 ?Peripheral Arterial Disease Assessment (using hand held doppler) ?ASSESSMENTS - Ostomy and/or Continence Assessment and Care ?[]  - 0 ?Incontinence Assessment and Management ?[]  - 0 ?Ostomy Care Assessment and Management (repouching, etc.) ?PROCESS - Coordination of Care ?X - Simple Patient / Family Education for ongoing care 1 15 ?[]  - 0 ?Complex (extensive) Patient / Family Education for ongoing care ?X- 1 10 ?Staff obtains Consents, Records, T Results / Process Orders ?est ?[]  - 0 ?Staff telephones HHA, Nursing Homes / Clarify orders / etc ?[]  - 0 ?Routine Transfer to another Facility (non-emergent condition) ?[]  - 0 ?Routine Hospital Admission (non-emergent condition) ?[]  - 0 ?New Admissions / Biomedical engineer / Ordering NPWT Apligraf, etc. ?, ?[]  - 0 ?Emergency Hospital Admission (emergent condition) ?X- 1 10 ?Simple Discharge Coordination ?[]  - 0 ?Complex (extensive) Discharge Coordination ?PROCESS - Special Needs ?[]  - 0 ?Pediatric / Minor Patient Management ?[]  - 0 ?Isolation Patient Management ?[]  - 0 ?Hearing / Language / Visual special needs ?[]  - 0 ?Assessment of Community assistance (transportation, D/C planning, etc.) ?[]  -  0 ?Additional assistance / Altered mentation ?[]  - 0 ?Support Surface(s) Assessment (bed, cushion, seat, etc.) ?INTERVENTIONS - Wound Cleansing / Measurement ?[]  - 0 ?Simple Wound  Cleansing - one wound ?X- 3 5 ?Complex Wound Cleansing - multiple wounds ?X- 1 5 ?Wound Imaging (photographs - any number of wounds) ?[]  - 0 ?Wound Tracing (instead of photographs) ?[]  - 0 ?Simple Wound Measurement - one wound ?X- 3 5 ?Complex Wound Measurement - multiple wounds ?INTERVENTIONS - Wound Dressings ?X - Small Wound Dressing one or multiple wounds 3 10 ?[]  - 0 ?Medium Wound Dressing one or multiple wounds ?[]  - 0 ?Large Wound Dressing one or multiple wounds ?[]  - 0 ?Application of Medications - topical ?[]  - 0 ?Application of Medications - injection ?INTERVENTIONS - Miscellaneous ?[]  - 0 ?External ear exam ?[]  - 0 ?Specimen Collection (cultures, biopsies, blood, body fluids, etc.) ?[]  - 0 ?Specimen(s) / Culture(s) sent or taken to Lab for analysis ?[]  - 0 ?Patient Transfer (multiple staff / Civil Service fast streamer / Similar devices) ?[]  - 0 ?Simple Staple / Suture removal (25 or less) ?[]  - 0 ?Complex Staple / Suture removal (26 or more) ?[]  - 0 ?Hypo / Hyperglycemic Management (close monitor of Blood Glucose) ?[]  - 0 ?Ankle / Brachial Index (ABI) - do not check if billed separately ?X- 1 5 ?Vital Signs ?Has the patient been seen at the hospital within the last three years: Yes ?Total Score: 140 ?Level Of Care: New/Established - Level 4 ?Electronic Signature(s) ?Signed: 08/14/2021 6:26:26 PM By: [name] RN, BSN ?Entered By: [name] on 08/14/2021 15:06:26 ?-------------------------------------------------------------------------------- ?Lower Extremity Assessment Details ?Patient Name: Date of Service: ?MCNEILL DA NIELS, Iantha 08/14/2021 2:00 PM ?Medical Record Number: [id number] ?Patient Account Number: [id number] ?Date of Birth/Sex: Treating RN: ?Dec 10, 1980 (41 y.o. F) Boehlein, Linda ?Primary Care Britne Borelli: SITA FA LWA LLA, SHA  RMIN Other Clinician: ?Referring Devynn Hessler: ?Treating Saket Hellstrom/Extender: Fredirick Maudlin ?SITA FA LWA LLA, SHA RMIN ?Weeks in Treatment: 32 ?Edema Assessment ?Assessed: [Left: No] [Right: No] ?Edema: [Left: N] [Right: o] ?Vascular Assessment ?Pulses: ?Dorsalis Pedis ?Palpable: [Left:Yes] ?Electronic Signature(s) ?Signed: 08/14/2021 6:26:26 PM By: [name] RN, BSN ?Entered By: [name] on 08/14/2021 14:45:09 ?-------------------------------------------------------------------------------- ?Multi Wound Chart Details ?Patient Name: Date of Service: ?MCNEILL DA NIELS, Edith 08/14/2021 2:00 PM ?Medical Record Number: [id number] ?Patient Account Number: [id number] ?Date of Birth/Sex: Treating RN: ?[birth year] (40 y.o. F) Boehlein, Linda ?Primary Care Jaqua Ching: SITA FA LWA LLA, SHA RMIN Other Clinician: ?Referring Kristee Angus: ?Treating Posey Petrik/Extender: Fredirick Maudlin ?SITA FA LWA LLA, SHA RMIN ?Weeks in Treatment: 32 ?Vital Signs ?Height(in): ?Capillary Blood Glucose(mg/dl): 114 ?Weight(lbs): ?Pulse(bpm): 74 ?Body Mass Index(BMI): ?Blood Pressure(mmHg): 137/78 ?Temperature(??F): 97.6 ?Respiratory Rate(breaths/min): 18 ?Photos: [6:Perineum] [7:Left T Fourth oe] [8:Sacrum] ?Wound Location: [6:Pressure Injury] [7:Trauma] [8:Pressure Injury] ?Wounding Event: [6:Pressure Ulcer] [7:Abrasion] [8:Pressure Ulcer] ?Primary Etiology: [6:Cataracts, Glaucoma, Anemia,] [7:Cataracts, Glaucoma, Anemia,] [8:Cataracts, Glaucoma, Anemia,] ?Comorbid History: [6:Congestive Heart Failure, Hypertension, Type II Diabetes, End Hypertension, Type II Diabetes, End Hypertension, Type II Diabetes, End Stage Renal Disease, Neuropathy, 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

## 2021-08-14 NOTE — Progress Notes (Signed)
[place], [place] ([id number]) ?Visit Report for 08/14/2021 ?Chief Complaint Document Details ?Patient Name: Date of Service: ?MCNEILL DA NIELS, Marta 08/14/2021 2:00 PM ?Medical Record Number: [id number] ?Patient Account Number: [id number] ?Date of Birth/Sex: Treating RN: ?1981-03-29 (40 y.o. F) Boehlein, Linda ?Primary Care Provider: SITA FA LWA LLA, SHA RMIN Other Clinician: ?Referring Provider: ?Treating Provider/Extender: Fredirick Maudlin ?SITA FA LWA LLA, SHA RMIN ?Weeks in Treatment: 32 ?Information Obtained from: Patient ?Chief Complaint ?06/15/2019; patient is here for review of the wound on her lower sacrum ?12/31/2020; patient is here for review of the wound on her right buttock ?Electronic Signature(s) ?Signed: 08/14/2021 3:08:32 PM By: Fredirick Maudlin MD FACS ?Entered By: Fredirick Maudlin on 08/14/2021 15:08:31 ?-------------------------------------------------------------------------------- ?HPI Details ?Patient Name: Date of Service: ?MCNEILL DA NIELS, Brendaly 08/14/2021 2:00 PM ?Medical Record Number: [id number] ?Patient Account Number: [id number] ?Date of Birth/Sex: Treating RN: ?1980/08/23 (40 y.o. F) Boehlein, Linda ?Primary Care Provider: SITA FA LWA LLA, SHA RMIN Other Clinician: ?Referring Provider: ?Treating Provider/Extender: Fredirick Maudlin ?SITA FA LWA LLA, SHA RMIN ?Weeks in Treatment: 32 ?History of Present Illness ?HPI Description: Admission ?06/15/2019 ?This is a 41 year old woman who comes to [place] from [facility] skilled facility in [place]. I am able to follow a lot of her history on care ?everywhere in epic. The patient is a type II diabetic and has been on dialysis. 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 I am not exactly sure of the nature of the stroke at this present time. She has made a decent recovery and is now up walking with a ?walker. ?In August 2020 she was admitted to [facility] in [place]  with L4-L5 discitis. Culture and sensitivity showed Klebsiella. She was discharged on 8 ?weeks of ceftazidime. In mid September she is readmitted to the hospital with a pressure ulcer on her lower sacrum and necrotizing fasciitis. She required an ?extensive surgical debridement. CT scan of the pelvis at 1 point showed communication with the rectum. A colostomy was recommended and placed. Culture ?of this area showed Klebsiella and yeast. She was discharged on an extended course of meropenem and Diflucan. [facility] in [place]. We do not have any of these records. I think she was discharged to another nursing home but is come to [facility] skilled facility ?in [facility]. She was sent down here for review of the remaining wound. By review of the pictures that the patient had from September this is ?made considerable improvement. She tells me that the wound VAC was discontinued at the end of December 2020. Currently the facility is using a form of ?silver alginate to the wound surface. 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 CVA with left hemiparesis in June 2020 but she ?appears to be making a good recovery. Chronic diastolic heart failure, hypertension ?2/19; patient's wound area slightly smaller. The area that was towards her anus has closed over. We are using Santyl covered with Hydrofera Blue. We have ?not been able to get her lab work apparently her veins are very difficult for phlebotomy. They are trying to get the blood work done where she dialyzes in [place] but that requires some logistical issues. ?2/26; not much change in the wound. Still a nonviable surface we have been using Santyl and Hydrofera Blue. I changed her to [facility] only. She tells ?me she has rigorously  offloading this area she also complains of some pain ?3/5; still no change here. I switch to pure  Hydrofera Blue. She still complains of a lot of pain although is a big wound it seems out of proportion to what I might ?expect. This was initially a surgical wound secondary to necrotizing wound infection. She is on dialysis. Other than that she thinks she is offloading this fairly ?rigorously. She has not been systemically unwell ?3/26; I switched to Iodoflex 3 weeks ago. This really seems to have helped, much better surface and slight improvement in the surface area. ?4/26; the patient is still on Iodoflex she missed an appointment 2 weeks ago. Doing slightly smaller. She is on dialysis ?5/10; using Hydrofera Blue. The major improvement here is the complete lack of depth. She has islands of epithelialization ?09/24/19-Patient comes in at 2 weeks, dimensions about the same, there is an [place] of epithelium at margin of wound, no slough noted ?10/15/2019 upon evaluation today patient appears to be doing well with regard to her wound. She has been tolerating the dressing changes in the sacral region ?without complication and at this point she seems to be healing quite nicely. I am extremely pleased with how things are progressing and the epithelial tissue ?seems to be spreading from the central portion as well which is also excellent. 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 We have been using Hydrofera Blue. The smaller area ?which has depth at 0.7 cm there is a question about whether they have been packing anything into this or just flaring Hydrofera Blue over the top ?9/23; its been more than 2 months since this patient was here. She still has a small area distally which is the remanent of her stage IV wound. They have been ?using Hydrofera Blue. She also has an area on the left gluteal ?10/22; 1 month follow-up. I think the original sacral wound is closed however the patient has 2 areas distally 1 over the coccyx a small pinpoint area and the ?other  into the gluteal cleft. Neither 1 of these looks particularly infected. I thought they might be connected but they were not. ?11/15; the original sacral wound is closed patient only has 1 remaining area. 1 of these is in the gluteal cleft just below the coccyx. The area over the coccyx ?from last time I think is all so closed ?12/31/2020; this is a patient that we saw for a pressure ulcer over her lower sacrum and coccyx for a long period of time in 2021. She eventually healed over. ?She is at [facility] skilled facility largely secondary to a remote CVA she had. She is also end-stage renal disease on dialysis secondary to type 2 ?diabetes. She tells [place] that since about mid June she has had an area more distally than the original sacrum/coccyx wound it looks as though they are putting ?silver alginate in this area. She is limited in her ability to offload this I think spending most of her time in a wheelchair although she can stand to transfer. She ?has an ostomy ?During her original stay here in 2021 I also list that she had a more distal area into her perineum that closed over well before the more proximal coccyx/sacral ?wound. She had a CT scan of the abdomen and pelvis on 12/01/2020. This did note first chronic sacral decubitus along the right aspect of her distal sacrum and ?coccyx. There was not felt to  be bone destruction she has chronic changes of bilateral sacroiliitis and chronic endplate destruction and sclerosis at L4-L5 ?secondary to previous osteomyelitis however no new findings were felt to be seen in terms of bone problems in this area ?01/14/2021; 2-week follow-up. Her wound is smaller essentially in the coccyx area. We have been using silver collagen. ?10/5; 3-week follow-up. The patient returns with her area in the coccyx healed however distally in the perineum there is a fairly long stage II area. I am ?assuming this patient's wound is a pressure related area. Although this would not be a usual  pressure area for a normal situation her immobility secondary to ?remote CVA, dialysis etc. make her at risk for pressure areas in an otherwise unusual setting. ?The patient is fairly upset stating that we

## 2021-08-17 ENCOUNTER — Telehealth: Payer: Self-pay

## 2021-08-17 NOTE — Telephone Encounter (Signed)
Received a call from [facility], they were unable to obtain urine specimen. ?

## 2021-08-18 ENCOUNTER — Encounter (HOSPITAL_COMMUNITY): Payer: Medicare Other | Admitting: Physical Therapy

## 2021-08-19 ENCOUNTER — Other Ambulatory Visit: Payer: Self-pay

## 2021-08-19 ENCOUNTER — Telehealth: Payer: Self-pay

## 2021-08-19 ENCOUNTER — Encounter (HOSPITAL_COMMUNITY): Payer: Medicare Other | Admitting: Physical Therapy

## 2021-08-19 DIAGNOSIS — S91309A Unspecified open wound, unspecified foot, initial encounter: Secondary | ICD-10-CM

## 2021-08-19 NOTE — Telephone Encounter (Signed)
-----   Message from Laurice Record, MD sent at 08/19/2021  2:02 PM EDT ----- ?MRI showed 4th toe bone infection. Referred to podiatry. Pt has follow-up on 4/24 with me ?

## 2021-08-19 NOTE — Telephone Encounter (Signed)
I reached out to [facility] and spoke with Lonn Georgia regarding referring patient to the podiatrist. Per Lonn Georgia they have patient scheduled with podiatry on 08/25/21 to see Dr. Irving Shows and he will be coming to the facility to see her.  ?[name] Barnett ?

## 2021-08-19 NOTE — Addendum Note (Signed)
Addended by: Daisy Floro T on: 08/19/2021 04:19 PM ? ? Modules accepted: Orders ? ?

## 2021-08-20 ENCOUNTER — Encounter (HOSPITAL_COMMUNITY): Payer: Medicare Other | Admitting: Physical Therapy

## 2021-08-24 ENCOUNTER — Other Ambulatory Visit: Payer: Self-pay

## 2021-08-24 ENCOUNTER — Ambulatory Visit (INDEPENDENT_AMBULATORY_CARE_PROVIDER_SITE_OTHER): Payer: Medicare Other | Admitting: Internal Medicine

## 2021-08-24 ENCOUNTER — Encounter: Payer: Self-pay | Admitting: Internal Medicine

## 2021-08-24 VITALS — BP 142/88 | HR 76 | Resp 16 | Ht 65.0 in | Wt 200.8 lb

## 2021-08-24 DIAGNOSIS — M868X9 Other osteomyelitis, unspecified sites: Secondary | ICD-10-CM

## 2021-08-24 DIAGNOSIS — R75 Inconclusive laboratory evidence of human immunodeficiency virus [HIV]: Secondary | ICD-10-CM | POA: Diagnosis not present

## 2021-08-24 NOTE — Progress Notes (Signed)
? ?   ? ? ? ? ?Patient Active Problem List  ? Diagnosis Date Noted  ? Chronic kidney disease (CKD) stage G5/A1, glomerular filtration rate (GFR) less than or equal to 15 mL/min/1.73 square meter and albuminuria creatinine ratio less than 30 mg/g (HCC) 06/22/2021  ? Dialysis patient [facility]) 06/22/2021  ? BV (bacterial vaginosis) 04/02/2020  ? Vaginal itching 04/02/2020  ? Vaginal odor 04/02/2020  ? Vaginal discharge 04/02/2020  ? Sepsis secondary to UTI ([place]) 01/13/2020  ? Acute respiratory failure with hypoxia ([place]) 01/13/2020  ? Right leg swelling 01/13/2020  ? Sacral decubitus ulcer 01/13/2020  ? Anemia due to end stage renal disease ([place]) 01/13/2020  ? Essential hypertension 01/13/2020  ? Hyperlipidemia 01/13/2020  ? Type 2 diabetes mellitus ([place]) 01/13/2020  ? ? ?Patient's Medications  ?New Prescriptions  ? No medications on file  ?Previous Medications  ? ACETAMINOPHEN (TYLENOL) 325 MG TABLET    Take 650 mg by mouth every 6 (six) hours as needed.  ? AMINO ACIDS-PROTEIN HYDROLYS (PRO-STAT 64 PO)    Take by mouth.  ? AMLODIPINE (NORVASC) 5 MG TABLET    Take 5 mg by mouth See admin instructions. Sun, Mon, Wed and Fri  ? ASCORBIC ACID (VITAMIN C) 500 MG TABLET    Take 500 mg by mouth 2 (two) times daily.  ? ASPIRIN EC 81 MG TABLET    Take 81 mg by mouth daily. Swallow whole.  ? ATENOLOL (TENORMIN) 25 MG TABLET    Take 0.5 tablets (12.5 mg total) by mouth See admin instructions. Every Sun, Mon, Wed and Fri  ? ATORVASTATIN (LIPITOR) 80 MG TABLET    Take 1 tablet by mouth at bedtime.  ? B COMPLEX-VITAMIN C-FOLIC ACID (NEPHRO-VITE) 0.8 MG TABS TABLET    Take 1 tablet by mouth daily.  ? BIOTIN 5 MG TABS    Take 5 mg by mouth in the morning and at bedtime.  ? BORIC ACID VAGINAL 600 MG SUPP    Place 1 suppository vaginally at bedtime. Place in vagina daily for 7 days  ? BUSPIRONE (BUSPAR) 5 MG TABLET    Take 5 mg by mouth 2 (two) times daily.  ? CALCIUM CARBONATE (TUMS - DOSED IN MG ELEMENTAL CALCIUM) 500 MG CHEWABLE TABLET     Chew 2 tablets by mouth 3 (three) times daily before meals.  ? CETAPHIL (CETAPHIL) LOTION    Apply 1 application topically 2 (two) times daily as needed for dry skin.  ? CHOLECALCIFEROL (VITAMIN D) 50 MCG (2000 UT) CAPS    Take 2,000 Units by mouth daily.  ? CLINDAMYCIN (CLEOCIN T) 1 % LOTION    Apply topically.  ? DESONIDE (DESOWEN) 0.05 % CREAM    SMARTSIG:2 Topical Twice Daily  ? DICYCLOMINE (BENTYL) 10 MG CAPSULE    Take 10 mg by mouth 3 (three) times daily before meals.  ? FERROUS SULFATE 325 (65 FE) MG TABLET    Take 325 mg by mouth daily.  ? FOSFOMYCIN (MONUROL) 3 G PACK    Take by mouth.  ? HUMALOG 100 UNIT/ML INJECTION    Inject 5 Units into the skin 3 (three) times daily before meals.  ? HYDROCODONE-ACETAMINOPHEN (NORCO/VICODIN) 5-325 MG TABLET    Take 1 tablet by mouth 2 (two) times daily as needed.  ? INSULIN GLARGINE (LANTUS SOLOSTAR) 100 UNIT/ML SOLOSTAR PEN    Inject into the skin.  ? KETOCONAZOLE (NIZORAL) 2 % CREAM    SMARTSIG:1 Topical Daily  ? LIDOCAINE 4 % PTCH  Apply 1 application topically daily at 6 (six) AM.  ? LIDOCAINE-PRILOCAINE (EMLA) CREAM    Apply 1 application topically daily as needed. Apply to HD graft site right arm topically one every tues, thurs, sat for pain.  ? MELATONIN 3 MG TABS TABLET    Take 3 mg by mouth at bedtime.  ? METHIMAZOLE (TAPAZOLE) 5 MG TABLET    Take 1 tablet (5 mg total) by mouth daily.  ? METRONIDAZOLE (METROGEL VAGINAL) 0.75 % VAGINAL GEL    Use 1 applicator in vagina at bedtime for 5 nights then prn  ? MULTIPLE VITAMINS-MINERALS (CERTAGEN PO)    Take 1 tablet by mouth in the morning.  ? [place] POWDER    Apply topically.  ? NYSTATIN CREAM (MYCOSTATIN)    Apply topically.  ? ONDANSETRON (ZOFRAN ODT) 4 MG DISINTEGRATING TABLET    Take 1 tablet (4 mg total) by mouth every 8 (eight) hours as needed for nausea or vomiting.  ? PANTOPRAZOLE (PROTONIX) 40 MG TABLET    Take 40 mg by mouth in the morning.  ? POLYETHYLENE GLYCOL (MIRALAX / GLYCOLAX) 17 G PACKET     Take 17 g by mouth daily.  ? ROPINIROLE (REQUIP) 0.5 MG TABLET    Take 0.5 mg by mouth at bedtime.  ? ROPINIROLE (REQUIP) 2 MG TABLET    Take 2 mg by mouth at bedtime.  ? SANTYL 250 UNIT/GM OINTMENT    Apply 1 application. topically daily.  ? SENNOSIDES-DOCUSATE SODIUM (SENOKOT-S) 8.6-50 MG TABLET    Take 2 tablets by mouth daily.  ? SIMETHICONE (MYLICON) 370 MG CHEWABLE TABLET    Chew 125 mg by mouth as needed for flatulence.  ? TAMSULOSIN (FLOMAX) 0.4 MG CAPS CAPSULE    Take 1 capsule (0.4 mg total) by mouth daily.  ? THIAMINE 100 MG TABLET    Take 100 mg by mouth daily.  ? TIZANIDINE (ZANAFLEX) 4 MG TABLET    Take 4 mg by mouth at bedtime.  ? TRAMADOL (ULTRAM) 50 MG TABLET    Take 100 mg by mouth every 6 (six) hours as needed.  ? ZINC SULFATE 220 (50 ZN) MG CAPSULE    Take 220 mg by mouth daily.  ?Modified Medications  ? No medications on file  ?Discontinued Medications  ? No medications on file  ? ? ?Subjective: ?[name] Barnett is a 41 y.o. F with PMHX of ESRD on hD, DMII, HTN , HLD, anemia, GERD, CVA with left side affected,  L4-L5 discitis SP biopsy  Cx + klebsiella in August in 2020 on ceftazidime after HD  when in she was admitted on 01/10/19 for nec fascitis(sacral per pt)) taken to OR with Cx+ ESBL klebsiella and yeast treated with meropenem(to complete 8 weeks 02/12/19) and diflucan requiring  colostomy presents for persistent  wounds and necrotizing tissue passed from vagina. On 2/8/3 pt passed tissue mass from vagina. Tissue was sent to path which noted necrotic tissue.  ?Sacral wounds: ?-Followed by wound Center with 3/15 notes chronic pressure ulcer of perineum, new pressure ulcer at sacrum, sequela of toe amp LE 4th toe ?Passing of necrotizing tissue: ?Per Dr. Tessie Fass note on 07/01/21: ?-CT AP ordered then declined by insurance ?- Referred to OB/Gyn, pap smear was unrevealing ?06/10/21 Vaginal Bx+, foreign boy showed necrotic tissue, negative for dysplasia and malignancy. ?-wbc 7.1 on 07/01/21 ?   ?-Seen by OB on 07/06/21(jennifer Laurann Montana, NP): CV swab + BV , Rx metronidazole gel. Pelvic exam unrevealing.  ?  ?07/24/21:  Pt reports she is here for Hx of bacteria "everywhere"(UTI/expolossive diarrhea/"just get sick everywhere). Although today, she is asymptomatic. Only passed necrotic tissue through vagina one. She reports she has not been sexually active for three year. Reports husband practiced MSM.  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 ?08/24/21: Pt reports she has not passed clots. Sacral wound in closing. She denies fever, chills, N/V/D. Plans to see podiatry tomorrow.  ?Review of Systems: ?Review of Systems  ?All other systems reviewed and are negative. ? ?Past Medical History:  ?Diagnosis Date  ? Diabetes mellitus without complication ([place])   ? Hyperlipidemia   ? Hypertension   ? Renal disorder   ? Stroke [facility])   ? Vitamin D deficiency   ? ? ?Social History  ? ?Tobacco Use  ? Smoking status: Never  ? Smokeless tobacco: Never  ?Vaping Use  ? Vaping Use: Never used  ?Substance Use Topics  ? Alcohol use: Never  ? Drug use: Never  ? ? ?Family History  ?Problem Relation Age of Onset  ? Cancer Paternal Grandmother   ? Heart disease Father   ? Hypertension Father   ? Diabetes Father   ? Glaucoma Father   ? Heart disease Mother   ? Hypertension Mother   ? Diabetes Mother   ? Diabetes Sister   ?     borderline  ? Hypertension Sister   ? ? ?Allergies  ?Allergen Reactions  ? Corn-Containing Products   ?  Per MAR  ? Lisinopril   ?  Per MAR  ? Morphine And Related   ?  Per MAR  ? ? ?Health Maintenance  ?Topic Date Due  ? FOOT EXAM  Never done  ? OPHTHALMOLOGY EXAM  Never done  ? URINE MICROALBUMIN  Never done  ? Hepatitis C Screening  Never done  ? TETANUS/TDAP  Never done  ? COVID-19 Vaccine (4 - Booster) 02/12/2021  ? HEMOGLOBIN A1C  09/22/2021  ? INFLUENZA VACCINE  12/01/2021  ? PAP SMEAR-Modifier  06/22/2024  ? HIV Screening  Completed  ? HPV VACCINES  Aged Out  ? ? ?Objective: ? ?There were no vitals  filed for this visit. ?There is no height or weight on file to calculate BMI. ? ?Physical Exam ?Constitutional:   ?   Appearance: Normal appearance.  ?HENT:  ?   Head: Normocephalic and atraumatic.

## 2021-08-25 ENCOUNTER — Encounter (HOSPITAL_COMMUNITY): Payer: Medicare Other | Admitting: Physical Therapy

## 2021-08-26 ENCOUNTER — Encounter (HOSPITAL_BASED_OUTPATIENT_CLINIC_OR_DEPARTMENT_OTHER): Payer: Medicare Other | Admitting: General Surgery

## 2021-08-26 ENCOUNTER — Ambulatory Visit (HOSPITAL_COMMUNITY): Payer: No Typology Code available for payment source | Admitting: Physical Therapy

## 2021-08-26 DIAGNOSIS — L89152 Pressure ulcer of sacral region, stage 2: Secondary | ICD-10-CM | POA: Diagnosis not present

## 2021-08-26 NOTE — Progress Notes (Addendum)
[name] Barnett, [name] Barnett ([id number]) Visit Report for 08/26/2021 Arrival Information Details Patient Name: Date of Service: [name] Barnett, [name] Barnett 08/26/2021 10:30 A M Medical Record Number: [id number] Patient Account Number: [id number] Date of Birth/Sex: Treating RN: 22-Feb-1981 (41 y.o. F) [name] Primary Care Haji Delaine: SITA FA LWA LLA, SHA RMIN Other Clinician: Referring Shanel Prazak: Treating Saaya Procell/Extender: Fredirick Maudlin SITA FA LWA LLA, SHA RMIN Weeks in Treatment: 34 Visit Information History Since Last Visit Added or deleted any medications: No Patient Arrived: Wheel Chair Any new allergies or adverse reactions: No Arrival Time: 10:25 Had a fall or experienced change in No Accompanied By: self activities of daily living that may affect Transfer Assistance: None risk of falls: Patient Identification Verified: Yes Signs or symptoms of abuse/neglect since last visito No Secondary Verification Process Completed: Yes Hospitalized since last visit: No Patient Requires Transmission-Based Precautions: No Implantable device outside of the clinic excluding No Patient Has Alerts: No cellular tissue based products placed in the center since last visit: Has Dressing in Place as Prescribed: Yes Pain Present Now: Yes Electronic Signature(s) Signed: 08/26/2021 4:48:49 PM By: [name] RN, BSN Entered By: [name] on 08/26/2021 10:35:56 -------------------------------------------------------------------------------- Clinic Level of Care Assessment Details Patient Name: Date of Service: [name] Barnett, [name] Barnett 08/26/2021 10:30 A M Medical Record Number: [id number] Patient Account Number: [id number] Date of Birth/Sex: Treating RN: [birth year] (41 y.o. F) [name] Primary Care Jamyia Fortune: SITA FA LWA LLA, SHA RMIN Other Clinician: Referring Kritika Stukes: Treating Deunte Bledsoe/Extender: Fredirick Maudlin SITA FA LWA LLA, SHA RMIN Weeks in Treatment: 34 Clinic Level of Care  Assessment Items TOOL 4 Quantity Score X- 1 0 Use when only an EandM is performed on FOLLOW-UP visit ASSESSMENTS - Nursing Assessment / Reassessment X- 1 10 Reassessment of Co-morbidities (includes updates in patient status) X- 1 5 Reassessment of Adherence to Treatment Plan ASSESSMENTS - Wound and Skin A ssessment / Reassessment []  - 0 Simple Wound Assessment / Reassessment - one wound X- 3 5 Complex Wound Assessment / Reassessment - multiple wounds []  - 0 Dermatologic / Skin Assessment (not related to wound area) ASSESSMENTS - Focused Assessment []  - 0 Circumferential Edema Measurements - multi extremities []  - 0 Nutritional Assessment / Counseling / Intervention []  - 0 Lower Extremity Assessment (monofilament, tuning fork, pulses) []  - 0 Peripheral Arterial Disease Assessment (using hand held doppler) ASSESSMENTS - Ostomy and/or Continence Assessment and Care []  - 0 Incontinence Assessment and Management []  - 0 Ostomy Care Assessment and Management (repouching, etc.) PROCESS - Coordination of Care X - Simple Patient / Family Education for ongoing care 1 15 []  - 0 Complex (extensive) Patient / Family Education for ongoing care X- 1 10 Staff obtains Programmer, systems, Records, T Results / Process Orders est X- 1 10 Staff telephones HHA, Nursing Homes / Clarify orders / etc []  - 0 Routine Transfer to another Facility (non-emergent condition) []  - 0 Routine Hospital Admission (non-emergent condition) []  - 0 New Admissions / Biomedical engineer / Ordering NPWT Apligraf, etc. , []  - 0 Emergency Hospital Admission (emergent condition) X- 1 10 Simple Discharge Coordination []  - 0 Complex (extensive) Discharge Coordination PROCESS - Special Needs []  - 0 Pediatric / Minor Patient Management []  - 0 Isolation Patient Management []  - 0 Hearing / Language / Visual special needs []  - 0 Assessment of Community assistance (transportation, D/C planning, etc.) []  -  0 Additional assistance / Altered mentation []  - 0 Support Surface(s) Assessment (bed, cushion, seat, etc.) INTERVENTIONS - Wound Cleansing / Measurement []  -  0 Simple Wound Cleansing - one wound X- 3 5 Complex Wound Cleansing - multiple wounds X- 1 5 Wound Imaging (photographs - any number of wounds) []  - 0 Wound Tracing (instead of photographs) []  - 0 Simple Wound Measurement - one wound X- 3 5 Complex Wound Measurement - multiple wounds INTERVENTIONS - Wound Dressings X - Small Wound Dressing one or multiple wounds 3 10 []  - 0 Medium Wound Dressing one or multiple wounds []  - 0 Large Wound Dressing one or multiple wounds X- 1 5 Application of Medications - topical []  - 0 Application of Medications - injection INTERVENTIONS - Miscellaneous []  - 0 External ear exam []  - 0 Specimen Collection (cultures, biopsies, blood, body fluids, etc.) []  - 0 Specimen(s) / Culture(s) sent or taken to Lab for analysis []  - 0 Patient Transfer (multiple staff / Civil Service fast streamer / Similar devices) []  - 0 Simple Staple / Suture removal (25 or less) []  - 0 Complex Staple / Suture removal (26 or more) []  - 0 Hypo / Hyperglycemic Management (close monitor of Blood Glucose) []  - 0 Ankle / Brachial Index (ABI) - do not check if billed separately X- 1 5 Vital Signs Has the patient been seen at the hospital within the last three years: Yes Total Score: 150 Level Of Care: New/Established - Level 4 Electronic Signature(s) Signed: 08/26/2021 4:48:49 PM By: [name] RN, BSN Entered By: [name] on 08/26/2021 11:28:48 -------------------------------------------------------------------------------- Encounter Discharge Information Details Patient Name: Date of Service: [name] Barnett, [name] Barnett 08/26/2021 10:30 A M Medical Record Number: [id number] Patient Account Number: [id number] Date of Birth/Sex: Treating RN: 1980/09/22 (41 y.o. F) [name] Primary Care Dorthy Hustead: SITA FA LWA  LLA, SHA RMIN Other Clinician: Referring Olisa Quesnel: Treating Nyari Olsson/Extender: Fredirick Maudlin SITA FA LWA LLA, SHA RMIN Weeks in Treatment: 76 Encounter Discharge Information Items Discharge Condition: Stable Ambulatory Status: Wheelchair Discharge Destination: Skilled Nursing Facility Orders Sent: Yes Transportation: Private Auto Accompanied By: self Schedule Follow-up Appointment: Yes Clinical Summary of Care: Patient Declined Electronic Signature(s) Signed: 08/26/2021 4:48:49 PM By: [name] RN, BSN Entered By: [name] on 08/26/2021 11:25:21 -------------------------------------------------------------------------------- Lower Extremity Assessment Details Patient Name: Date of Service: [name] Barnett, [name] Barnett 08/26/2021 10:30 A M Medical Record Number: [id number] Patient Account Number: [id number] Date of Birth/Sex: Treating RN: [birth year] (41 y.o. [name] Primary Care Sariah Henkin: SITA FA LWA LLA, SHA RMIN Other Clinician: Referring Kasheem Toner: Treating Dmetrius Ambs/Extender: Fredirick Maudlin SITA FA LWA LLA, SHA RMIN Weeks in Treatment: 34 Edema Assessment Assessed: [Left: No] [Right: No] Edema: [Left: N] [Right: o] Electronic Signature(s) Signed: 08/26/2021 4:48:49 PM By: [name] RN, BSN Entered By: [name] on 08/26/2021 10:36:38 -------------------------------------------------------------------------------- Multi Wound Chart Details Patient Name: Date of Service: [name] Barnett, [name] Barnett 08/26/2021 10:30 A M Medical Record Number: [id number] Patient Account Number: [id number] Date of Birth/Sex: Treating RN: 1981-01-23 (41 y.o. F) [name] Primary Care Jurgen Groeneveld: SITA FA LWA LLA, SHA RMIN Other Clinician: Referring Zimal Weisensel: Treating Chris Narasimhan/Extender: Fredirick Maudlin SITA FA LWA LLA, SHA RMIN Weeks in Treatment: 34 Vital Signs Height(in): Capillary Blood Glucose(mg/dl): 171 Weight(lbs): Pulse(bpm): 85 Body Mass  Index(BMI): Blood Pressure(mmHg): 134/86 Temperature(F): 97.8 Respiratory Rate(breaths/min): 18 Photos: Perineum Left T Fourth oe Sacrum Wound Location: Pressure Injury Trauma Pressure Injury Wounding Event: Pressure Ulcer Abrasion Pressure Ulcer Primary Etiology: Cataracts, Glaucoma, Anemia, Cataracts, Glaucoma, Anemia, Cataracts, Glaucoma, Anemia, Comorbid History: Congestive Heart Failure, Congestive Heart Failure, Congestive Heart Failure, Hypertension, Type II Diabetes, End Hypertension, Type II Diabetes, End Hypertension, Type II Diabetes,  End Stage Renal Disease, Neuropathy, Stage Renal Disease, Neuropathy, Stage Renal Disease, Neuropathy, Confinement Anxiety Confinement Anxiety Confinement Anxiety 02/04/2021 07/08/2021 07/15/2021 Date Acquired: 29 7 6  Weeks of Treatment: Open Open Open Wound Status: No No No Wound Recurrence: No Yes No Clustered Wound: 2.3x1.5x0.1 0.1x0.1x0.1 1.5x0.3x0.2 Measurements L x W x D (cm) 2.71 0.008 0.353 A (cm) : rea 0.271 0.001 0.071 Volume (cm) : -54.10% 98.60% 16.70% % Reduction in Area: 23.00% 99.40% 16.50% % Reduction in Volume: Category/Stage II Full Thickness With Exposed Support Category/Stage II Classification: Structures Medium None Present Medium Exudate Amount: Serosanguineous N/A Serosanguineous Exudate Type: red, brown N/A red, brown Exudate Color: Distinct, outline attached Distinct, outline attached Fibrotic scar, thickened scar Wound Margin: Large (67-100%) Large (67-100%) Large (67-100%) Granulation Amount: Red, Pink Pink Red Granulation Quality: Small (1-33%) None Present (0%) Small (1-33%) Necrotic Amount: Fat Layer (Subcutaneous Tissue): Yes Fat Layer (Subcutaneous Tissue): Yes Fat Layer (Subcutaneous Tissue): Yes Exposed Structures: Fascia: No Fascia: No Fascia: No Tendon: No Tendon: No Tendon: No Muscle: No Muscle: No Muscle: No Joint: No Joint: No Joint: No Bone: No Bone: No Bone:  No None Large (67-100%) Medium (34-66%) Epithelialization: Treatment Notes Wound #6 (Perineum) Cleanser Wound Cleanser Discharge Instruction: Cleanse the wound with wound cleanser prior to applying a clean dressing using gauze sponges, not tissue or cotton balls. Peri-Wound Care Skin Prep Discharge Instruction: Use skin prep as directed Topical Primary Dressing AquacelAg Advantage Dressing, 2X2 (in/in) Discharge Instruction: Apply Aquacel to wound bed Secondary Dressing Zetuvit Plus Silicone Border Dressing 4x4 (in/in) Discharge Instruction: Apply silicone border over primary dressing as directed. Secured With Compression Wrap Compression Stockings Add-Ons Wound #7 (Toe Fourth) Wound Laterality: Left Cleanser Soap and Water Discharge Instruction: May shower and wash wound with dial antibacterial soap and water prior to dressing change. Wound Cleanser Discharge Instruction: Cleanse the wound with wound cleanser prior to applying a clean dressing using gauze sponges, not tissue or cotton balls. Peri-Wound Care Topical Primary Dressing Hydrofera Blue Classic Foam, 2x2 in Discharge Instruction: Moisten with saline prior to applying to wound bed Secondary Dressing Secured With Conforming Stretch Gauze Bandage, Sterile 2x75 (in/in) Discharge Instruction: Secure with stretch gauze as directed. 31M Medipore Soft Cloth Surgical T 2x10 (in/yd) ape Discharge Instruction: Secure with tape as directed. Compression Wrap Compression Stockings Add-Ons Wound #8 (Sacrum) Cleanser Wound Cleanser Discharge Instruction: Cleanse the wound with wound cleanser prior to applying a clean dressing using gauze sponges, not tissue or cotton balls. Peri-Wound Care Skin Prep Discharge Instruction: Use skin prep as directed Topical Primary Dressing AquacelAg Advantage Dressing, 2X2 (in/in) Discharge Instruction: Apply Aquacel to wound bed Secondary Dressing Zetuvit Plus Silicone Border Dressing  4x4 (in/in) Discharge Instruction: Apply silicone border over primary dressing as directed. Secured With Compression Wrap Compression Stockings Environmental education officer) Signed: 08/26/2021 12:38:46 PM By: Fredirick Maudlin MD FACS Signed: 08/26/2021 4:48:49 PM By: [name] RN, BSN Entered By: Fredirick Maudlin on 08/26/2021 12:38:46 -------------------------------------------------------------------------------- Multi-Disciplinary Care Plan Details Patient Name: Date of Service: [name] Barnett, [name] Barnett 08/26/2021 10:30 A M Medical Record Number: [id number] Patient Account Number: [id number] Date of Birth/Sex: Treating RN: [birth year] (41 y.o. F) [name] Primary Care Sherah Lund: SITA FA LWA LLA, SHA RMIN Other Clinician: Referring Joanell Cressler: Treating Lilie Vezina/Extender: Fredirick Maudlin SITA FA LWA LLA, SHA RMIN Weeks in Treatment: [place] reviewed with physician Active Inactive Electronic Signature(s) Signed: 10/20/2021 5:04:42 PM By: Deon Pilling RN, BSN Signed: 12/08/2021 6:15:19 PM By: [name] RN, BSN Previous Signature: 08/26/2021 4:48:49  PM Version By: [name] RN, BSN Entered By: Deon Pilling on 10/20/2021 17:04:41 -------------------------------------------------------------------------------- Pain Assessment Details Patient Name: Date of Service: [name] Barnett, [name] Barnett 08/26/2021 10:30 A M Medical Record Number: [id number] Patient Account Number: [id number] Date of Birth/Sex: Treating RN: [birth year] (41 y.o. F) [name] Primary Care Joseandres Mazer: SITA FA LWA LLA, SHA RMIN Other Clinician: Referring Accalia Rigdon: Treating Bunny Kleist/Extender: Fredirick Maudlin SITA FA LWA LLA, SHA RMIN Weeks in Treatment: 34 Active Problems Location of Pain Severity and Description of Pain Patient Has Paino Yes Site Locations Pain Location: Pain in Ulcers Duration of the Pain. Constant / Intermittento Intermittent Rate the  pain. Current Pain Level: 5 Character of Pain Describe the Pain: Aching Pain Management and Medication Current Pain Management: Medication: Yes Electronic Signature(s) Signed: 08/26/2021 4:48:49 PM By: [name] RN, BSN Entered By: [name] on 08/26/2021 10:36:30 -------------------------------------------------------------------------------- Patient/Caregiver Education Details Patient Name: Date of Service: [name] Barnett, Jaslen 4/26/2023andnbsp10:30 [place] Number: [id number] Patient Account Number: [id number] Date of Birth/Gender: Treating RN: December 06, 1980 (41 y.o. F) [name] Primary Care Physician: SITA FA LWA LLA, SHA RMIN Other Clinician: Referring Physician: Treating Physician/Extender: Fredirick Maudlin SITA FA LWA LLA, SHA RMIN Weeks in Treatment: 59 Education Assessment Education Provided To: Patient Education Topics Provided Wound/Skin Impairment: Methods: Explain/Verbal Responses: Reinforcements needed, State content correctly Electronic Signature(s) Signed: 08/26/2021 4:48:49 PM By: [name] RN, BSN Entered By: [name] on 08/26/2021 10:47:06 -------------------------------------------------------------------------------- Wound Assessment Details Patient Name: Date of Service: [name] Barnett, [name] Barnett 08/26/2021 10:30 A M Medical Record Number: [id number] Patient Account Number: [id number] Date of Birth/Sex: Treating RN: 1980-07-13 (41 y.o. F) [name] Primary Care Rosemae Mcquown: SITA FA LWA LLA, SHA RMIN Other Clinician: Referring Keona Sheffler: Treating Evalyne Cortopassi/Extender: Fredirick Maudlin SITA FA LWA LLA, SHA RMIN Weeks in Treatment: 34 Wound Status Wound Number: 6 Primary Pressure Ulcer Etiology: Wound Location: Perineum Wound Open Wounding Event: Pressure Injury Status: Date Acquired: 02/04/2021 Comorbid Cataracts, Glaucoma, Anemia, Congestive Heart Failure, Weeks Of Treatment: 29 History: Hypertension, Type  II Diabetes, End Stage Renal Disease, Clustered Wound: No Neuropathy, Confinement Anxiety Photos Wound Measurements Length: (cm) 2.3 Width: (cm) 1.5 Depth: (cm) 0.1 Area: (cm) 2.71 Volume: (cm) 0.271 % Reduction in Area: -54.1% % Reduction in Volume: 23% Epithelialization: None Tunneling: No Undermining: No Wound Description Classification: Category/Stage II Wound Margin: Distinct, outline attached Exudate Amount: Medium Exudate Type: Serosanguineous Exudate Color: red, brown Foul Odor After Cleansing: No Slough/Fibrino Yes Wound Bed Granulation Amount: Large (67-100%) Exposed Structure Granulation Quality: Red, Pink Fascia Exposed: No Necrotic Amount: Small (1-33%) Fat Layer (Subcutaneous Tissue) Exposed: Yes Tendon Exposed: No Muscle Exposed: No Joint Exposed: No Bone Exposed: No Electronic Signature(s) Signed: 08/26/2021 4:48:49 PM By: [name] RN, BSN Entered By: [name] on 08/26/2021 10:52:18 -------------------------------------------------------------------------------- Wound Assessment Details Patient Name: Date of Service: [name] Barnett, [place] 08/26/2021 10:30 A M Medical Record Number: [id number] Patient Account Number: [id number] Date of Birth/Sex: Treating RN: 1980-07-16 (41 y.o. F) [name] Primary Care Keagan Anthis: SITA FA LWA LLA, SHA RMIN Other Clinician: Referring Jhane Lorio: Treating Casten Floren/Extender: Fredirick Maudlin SITA FA LWA LLA, SHA RMIN Weeks in Treatment: 34 Wound Status Wound Number: 7 Primary Abrasion Etiology: Wound Location: Left T Fourth oe Wound Open Wounding Event: Trauma Status: Date Acquired: 07/08/2021 Comorbid Cataracts, Glaucoma, Anemia, Congestive Heart Failure, Weeks Of Treatment: 7 History: Hypertension, Type II Diabetes, End Stage Renal Disease, Clustered Wound: Yes Neuropathy, Confinement Anxiety Photos Wound Measurements Length: (cm) 0.1 Width: (cm) 0.1 Depth: (cm) 0.1 Area: (  cm)  0.008 Volume: (cm) 0.001 % Reduction in Area: 98.6% % Reduction in Volume: 99.4% Epithelialization: Large (67-100%) Tunneling: No Undermining: No Wound Description Classification: Full Thickness With Exposed Support Structures Wound Margin: Distinct, outline attached Exudate Amount: None Present Foul Odor After Cleansing: No Slough/Fibrino No Wound Bed Granulation Amount: Large (67-100%) Exposed Structure Granulation Quality: Pink Fascia Exposed: No Necrotic Amount: None Present (0%) Fat Layer (Subcutaneous Tissue) Exposed: Yes Tendon Exposed: No Muscle Exposed: No Joint Exposed: No Bone Exposed: No Electronic Signature(s) Signed: 08/26/2021 4:48:49 PM By: [name] RN, BSN Entered By: [name] on 08/26/2021 10:50:21 -------------------------------------------------------------------------------- Wound Assessment Details Patient Name: Date of Service: [name] Barnett, [place] 08/26/2021 10:30 A M Medical Record Number: [id number] Patient Account Number: [id number] Date of Birth/Sex: Treating RN: 11/30/1980 (41 y.o. F) [name] Primary Care Elecia Serafin: SITA FA LWA LLA, SHA RMIN Other Clinician: Referring Dashiell Franchino: Treating Ismail Graziani/Extender: Fredirick Maudlin SITA FA LWA LLA, SHA RMIN Weeks in Treatment: 34 Wound Status Wound Number: 8 Primary Pressure Ulcer Etiology: Wound Location: Sacrum Wound Open Wounding Event: Pressure Injury Status: Date Acquired: 07/15/2021 Comorbid Cataracts, Glaucoma, Anemia, Congestive Heart Failure, Weeks Of Treatment: 6 History: Hypertension, Type II Diabetes, End Stage Renal Disease, Clustered Wound: No Neuropathy, Confinement Anxiety Photos Wound Measurements Length: (cm) 1.5 Width: (cm) 0.3 Depth: (cm) 0.2 Area: (cm) 0.353 Volume: (cm) 0.071 % Reduction in Area: 16.7% % Reduction in Volume: 16.5% Epithelialization: Medium (34-66%) Tunneling: No Undermining: No Wound Description Classification:  Category/Stage II Wound Margin: Fibrotic scar, thickened scar Exudate Amount: Medium Exudate Type: Serosanguineous Exudate Color: red, brown Foul Odor After Cleansing: No Slough/Fibrino Yes Wound Bed Granulation Amount: Large (67-100%) Exposed Structure Granulation Quality: Red Fascia Exposed: No Necrotic Amount: Small (1-33%) Fat Layer (Subcutaneous Tissue) Exposed: Yes Necrotic Quality: Adherent Slough Tendon Exposed: No Muscle Exposed: No Joint Exposed: No Bone Exposed: No Electronic Signature(s) Signed: 08/26/2021 4:48:49 PM By: [name] RN, BSN Entered By: [name] on 08/26/2021 10:51:19 -------------------------------------------------------------------------------- [place] Details Patient Name: Date of Service: [name] Barnett, [place] 08/26/2021 10:30 A M Medical Record Number: [id number] Patient Account Number: [id number] Date of Birth/Sex: Treating RN: Aug 07, 1980 (41 y.o. F) [name] Primary Care Lenzie Montesano: SITA FA LWA LLA, SHA RMIN Other Clinician: Referring Tongela Encinas: Treating Kioni Stahl/Extender: Fredirick Maudlin SITA FA LWA LLA, SHA RMIN Weeks in Treatment: 34 Vital Signs Time Taken: 10:38 Temperature (F): 97.8 Pulse (bpm): 81 Respiratory Rate (breaths/min): 18 Blood Pressure (mmHg): 134/86 Capillary Blood Glucose (mg/dl): 171 Reference Range: 80 - 120 mg / dl Electronic Signature(s) Signed: 08/26/2021 4:48:49 PM By: [name] RN, BSN Entered By: [name] on 08/26/2021 10:38:53

## 2021-08-26 NOTE — Progress Notes (Signed)
[place], [place] ([id number]) ?Visit Report for 08/26/2021 ?Chief Complaint Document Details ?Patient Name: Date of Service: ?Barnett Barnett 08/26/2021 10:30 A M ?Medical Record Number: [id number] ?Patient Account Number: [id number] ?Date of Birth/Sex: Treating RN: ?[birth year] (40 y.o. F) Barnett Barnett ?Primary Care Provider: SITA FA LWA LLA, SHA Barnett Other Clinician: ?Referring Provider: ?Treating Provider/Extender: Barnett Barnett ?Barnett Barnett ?Weeks in Treatment: 34 ?Information Obtained from: Patient ?Chief Complaint ?06/15/2019; patient is here for review of the wound on her lower sacrum ?12/31/2020; patient is here for review of the wound on her right buttock ?Electronic Signature(s) ?Signed: 08/26/2021 12:38:55 PM By: Barnett Maudlin MD FACS ?Entered By: Barnett Barnett on 08/26/2021 12:38:55 ?-------------------------------------------------------------------------------- ?HPI Details ?Patient Name: Date of Service: ?Barnett Barnett 08/26/2021 10:30 A M ?Medical Record Number: [id number] ?Patient Account Number: [id number] ?Date of Birth/Sex: Treating RN: ?Jun 05, 1980 (40 y.o. F) Barnett Barnett ?Primary Care Provider: SITA FA LWA LLA, SHA Barnett Other Clinician: ?Referring Provider: ?Treating Provider/Extender: Barnett Barnett ?Barnett Barnett ?Weeks in Treatment: 34 ?History of Present Illness ?HPI Description: Admission ?06/15/2019 ?This is a 41 year old woman who comes to [place] from [facility] skilled facility in [place]. I am able to follow a lot of her history on care ?everywhere in epic. The patient is a type II diabetic and has been on dialysis. 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 I am not exactly sure of the nature of the stroke at this present time. She has made a decent recovery and is now up walking with a ?walker. ?In August 2020 she was admitted to [facility] in  [place] with L4-L5 discitis. Culture and sensitivity showed Klebsiella. She was discharged on 8 ?weeks of ceftazidime. In mid September she is readmitted to the hospital with a pressure ulcer on her lower sacrum and necrotizing fasciitis. She required an ?extensive surgical debridement. CT scan of the pelvis at 1 point showed communication with the rectum. A colostomy was recommended and placed. Culture ?of this area showed Klebsiella and yeast. She was discharged on an extended course of meropenem and Diflucan. She [facility] in [place]. We do not have any of these records. I think she was discharged to another nursing home but is come to [facility] skilled facility ?in [facility]. She was sent down here for review of the remaining wound. By review of the pictures that the patient had from September this is ?made considerable improvement. She tells me that the wound VAC was discontinued at the end of December 2020. Currently the facility is using a form of ?silver alginate to the wound surface. 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 CVA with left hemiparesis in June 2020 but she ?appears to be making a good recovery. Chronic diastolic heart failure, hypertension ?2/19; patient's wound area slightly smaller. The area that was towards her anus has closed over. We are using Santyl covered with Hydrofera Blue. We have ?not been able to get her lab work apparently her veins are very difficult for phlebotomy. They are trying to get the blood work done where she dialyzes in [place] but that requires some logistical issues. ?2/26; not much change in the wound. Still a nonviable surface we have been using Santyl and Hydrofera Blue. I changed her to [facility] only. She tells ?me she  has rigorously offloading this area she also complains of some pain ?3/5; still no change here. I  switch to pure Hydrofera Blue. She still complains of a lot of pain although is a big wound it seems out of proportion to what I might ?expect. This was initially a surgical wound secondary to necrotizing wound infection. She is on dialysis. Other than that she thinks she is offloading this fairly ?rigorously. She has not been systemically unwell ?3/26; I switched to Iodoflex 3 weeks ago. This really seems to have helped, much better surface and slight improvement in the surface area. ?4/26; the patient is still on Iodoflex she missed an appointment 2 weeks ago. Doing slightly smaller. She is on dialysis ?5/10; using Hydrofera Blue. The major improvement here is the complete lack of depth. She has islands of epithelialization ?09/24/19-Patient comes in at 2 weeks, dimensions about the same, there is an [place] of epithelium at margin of wound, no slough noted ?10/15/2019 upon evaluation today patient appears to be doing well with regard to her wound. She has been tolerating the dressing changes in the sacral region ?without complication and at this point she seems to be healing quite nicely. I am extremely pleased with how things are progressing and the epithelial tissue ?seems to be spreading from the central portion as well which is also excellent. 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 We have been using Hydrofera Blue. The smaller area ?which has depth at 0.7 cm there is a question about whether they have been packing anything into this or just flaring Hydrofera Blue over the top ?9/23; its been more than 2 months since this patient was here. She still has a small area distally which is the remanent of her stage IV wound. They have been ?using Hydrofera Blue. She also has an area on the left gluteal ?10/22; 1 month follow-up. I think the original sacral wound is closed however the patient has 2 areas distally 1 over the coccyx a small pinpoint area  and the ?other into the gluteal cleft. Neither 1 of these looks particularly infected. I thought they might be connected but they were not. ?11/15; the original sacral wound is closed patient only has 1 remaining area. 1 of these is in the gluteal cleft just below the coccyx. The area over the coccyx ?from last time I think is all so closed ?12/31/2020; this is a patient that we saw for a pressure ulcer over her lower sacrum and coccyx for a long period of time in 2021. She eventually healed over. ?She is at [facility] skilled facility largely secondary to a remote CVA she had. She is also end-stage renal disease on dialysis secondary to type 2 ?diabetes. She tells [place] that since about mid June she has had an area more distally than the original sacrum/coccyx wound it looks as though they are putting ?silver alginate in this area. She is limited in her ability to offload this I think spending most of her time in a wheelchair although she can stand to transfer. She ?has an ostomy ?During her original stay here in 2021 I also list that she had a more distal area into her perineum that closed over well before the more proximal coccyx/sacral ?wound. She had a CT scan of the abdomen and pelvis on 12/01/2020. This did note first chronic sacral decubitus along the right aspect of her distal sacrum and ?coccyx. There was not  felt to be bone destruction she has chronic changes of bilateral sacroiliitis and chronic endplate destruction and sclerosis at L4-L5 ?secondary to previous osteomyelitis however no new findings were felt to be seen in terms of bone problems in this area ?01/14/2021; 2-week follow-up. Her wound is smaller essentially in the coccyx area. We have been using silver collagen. ?10/5; 3-week follow-up. The patient returns with her area in the coccyx healed however distally in the perineum there is a fairly long stage II area. I am ?assuming this patient's wound is a pressure related area. Although this would not  be a usual pressure area for a normal situation her immobility secondary to ?remote CVA, dialysis etc. make her at risk for pressure areas in an otherwise unusual setting. ?The patient is fairly upset stating th

## 2021-08-27 ENCOUNTER — Encounter (HOSPITAL_COMMUNITY): Payer: Medicare Other | Admitting: Physical Therapy

## 2021-08-28 ENCOUNTER — Ambulatory Visit: Payer: Self-pay

## 2021-08-28 ENCOUNTER — Ambulatory Visit (INDEPENDENT_AMBULATORY_CARE_PROVIDER_SITE_OTHER): Payer: Medicare Other | Admitting: Specialist

## 2021-08-28 ENCOUNTER — Encounter: Payer: Self-pay | Admitting: Specialist

## 2021-08-28 VITALS — BP 95/66 | HR 86 | Ht 65.0 in | Wt 201.0 lb

## 2021-08-28 DIAGNOSIS — M4646 Discitis, unspecified, lumbar region: Secondary | ICD-10-CM | POA: Diagnosis not present

## 2021-08-28 DIAGNOSIS — M4620 Osteomyelitis of vertebra, site unspecified: Secondary | ICD-10-CM | POA: Diagnosis not present

## 2021-08-28 DIAGNOSIS — G8194 Hemiplegia, unspecified affecting left nondominant side: Secondary | ICD-10-CM | POA: Diagnosis not present

## 2021-08-28 NOTE — Progress Notes (Signed)
? ?Office Visit Note ?  ?Patient: [name] Barnett           ?Date of Birth: 1980-09-18           ?MRN: [id number] ?Visit Date: 08/28/2021 ?             ?Requested by: Julianne Handler, DO ?[facility] ?[place],  [place] [address] ?PCP: Julianne Handler, DO ? ? ?Assessment & Plan: ?Visit Diagnoses:  ?1. Discitis of lumbar region   ?2. Vertebral osteomyelitis ([place])   ? ? ?Plan: Avoid frequent bending and stooping  ?No lifting greater than 10 lbs. ?May use ice or moist heat for pain. ?Weight loss is of benefit. ?Get the left heel cord lengthened to allow for the use of an AFO to facilitate better walking. ?Only surgery that may assist the lumbar condition would be resection of the area of discitis and osteomyelitis and arthrodese with bone graft and then then go to the back and place screws and rods, In the face of renal dialysis the risk of infection recurring is great and she is demonstrating gradual autofusion of the area of discitis L4-5. I don't recommend this extensive surgery at this time and continued therapy has been beneficial in improving her overall ?Level of function.  ?Exercise is important to improve your indurance and does allow people to function better inspite of back pain. ? ?Follow-Up Instructions: Return in about 1 month (around 09/27/2021).  ? ?Orders:  ?Orders Placed This Encounter  ?Procedures  ? XR Lumbar Spine Complete W/Bend  ? ?No orders of the defined types were placed in this encounter. ? ? ? ? Procedures: ?No procedures performed ? ? ?Clinical Data: ?No additional findings. ? ? ?Subjective: ?Chief Complaint  ?Patient presents with  ? Lower Back - Pain, Follow-up  ? ? ?41 year old female with history of discitis osteomyelitis and left CVA. She is rehabilitating while at [facility]. Initially was treated in [place] for the infection, and subsequent CVA. ?She is now starting to walk with a walkier short distances and has seen Dr. Sharol Given for consideration of left Heel cord  lengthening procedure. She is not able to use an AFO due to contracture and this will ?Facilitate her increasing her standing and walking tolerance.  ? ? ?Review of Systems  ?Constitutional: Negative.   ?HENT: Negative.    ?Eyes: Negative.   ?Respiratory: Negative.    ?Cardiovascular: Negative.   ?Gastrointestinal: Negative.   ?Endocrine: Negative.   ?Genitourinary: Negative.   ?Musculoskeletal: Negative.   ?Skin: Negative.   ?Allergic/Immunologic: Negative.   ?Neurological: Negative.   ?Hematological: Negative.   ?Psychiatric/Behavioral: Negative.    ? ? ?Objective: ?Vital Signs: BP 95/66   Pulse 86   Ht 5\' 5"  (1.651 m)   Wt 201 lb (91.2 kg)   BMI 33.45 kg/m?  ? ?Physical Exam ?Constitutional:   ?   Appearance: She is well-developed.  ?HENT:  ?   Head: Normocephalic and atraumatic.  ?Eyes:  ?   Pupils: Pupils are equal, round, and reactive to light.  ?Pulmonary:  ?   Effort: Pulmonary effort is normal.  ?   Breath sounds: Normal breath sounds.  ?Abdominal:  ?   General: Bowel sounds are normal.  ?   Palpations: Abdomen is soft.  ?Musculoskeletal:     ?   General: Normal range of motion.  ?   Cervical back: Normal range of motion and neck supple.  ?Skin: ?   General: Skin is warm and dry.  ?  Neurological:  ?   Mental Status: She is alert and oriented to person, place, and time.  ?Psychiatric:     ?   Behavior: Behavior normal.     ?   Thought Content: Thought content normal.     ?   Judgment: Judgment normal.  ? ? ?Back Exam  ? ?Tenderness  ?The patient is experiencing tenderness in the lumbar. ? ?Muscle Strength  ?Right Quadriceps:  5/5  ?Left Quadriceps:  5/5  ?Right Hamstrings:  5/5  ?Left Hamstrings:  5/5  ? ?Reflexes  ?Patellar:  0/4 ?Achilles:  2/4 ?Biceps:  0/4 ?Babinski's sign: normal  ? ? ? ? ?Specialty Comments:  ?No specialty comments available. ? ?Imaging: ?No results found. ? ? ?PMFS History: ?Patient Active Problem List  ? Diagnosis Date Noted  ? Chronic kidney disease (CKD) stage G5/A1, glomerular  filtration rate (GFR) less than or equal to 15 mL/min/1.73 square meter and albuminuria creatinine ratio less than 30 mg/g (HCC) 06/22/2021  ? Dialysis patient [facility]) 06/22/2021  ? BV (bacterial vaginosis) 04/02/2020  ? Vaginal itching 04/02/2020  ? Vaginal odor 04/02/2020  ? Vaginal discharge 04/02/2020  ? Sepsis secondary to UTI ([place]) 01/13/2020  ? Acute respiratory failure with hypoxia ([place]) 01/13/2020  ? Right leg swelling 01/13/2020  ? Sacral decubitus ulcer 01/13/2020  ? 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 Essential hypertension 01/13/2020  ? Hyperlipidemia 01/13/2020  ? Type 2 diabetes mellitus ([place]) 01/13/2020  ? ?Past Medical History:  ?Diagnosis Date  ? Diabetes mellitus without complication ([place])   ? Hyperlipidemia   ? Hypertension   ? Renal disorder   ? Stroke [facility])   ? Vitamin D deficiency   ?  ?Family History  ?Problem Relation Age of Onset  ? Cancer Paternal Grandmother   ? Heart disease Father   ? Hypertension Father   ? Diabetes Father   ? Glaucoma Father   ? Heart disease Mother   ? Hypertension Mother   ? Diabetes Mother   ? Diabetes Sister   ?     borderline  ? Hypertension Sister   ?  ?Past Surgical History:  ?Procedure Laterality Date  ? cardiac stents    ? CATARACT EXTRACTION W/PHACO Left 08/29/2020  ? Procedure: CATARACT EXTRACTION PHACO AND INTRAOCULAR LENS PLACEMENT LEFT EYE;  Surgeon: Baruch Goldmann, MD;  Location: [facility];  Service: Ophthalmology;  Laterality: Left;  left ?CDE=29.30  ? CATARACT EXTRACTION W/PHACO Right 11/28/2020  ? Procedure: CATARACT EXTRACTION PHACO AND INTRAOCULAR LENS PLACEMENT ([facility]);  Surgeon: Baruch Goldmann, MD;  Location: [facility];  Service: Ophthalmology;  Laterality: Right;  CDE 4.35  ? COLOSTOMY    ? TUBAL LIGATION    ? wound on buttocks    ? ?Social History  ? ?Occupational History  ? Not on file  ?Tobacco Use  ? Smoking status: Never  ? Smokeless tobacco: Never  ?Vaping Use  ? Vaping Use: Never used  ?Substance and Sexual Activity  ? Alcohol  use: Never  ? Drug use: Never  ? Sexual activity: Not Currently  ?  Birth control/protection: Surgical  ?  Comment: tubal  ? ? ? ? ? ? ?

## 2021-08-28 NOTE — Patient Instructions (Signed)
Avoid frequent bending and stooping  ?No lifting greater than 10 lbs. ?May use ice or moist heat for pain. ?Weight loss is of benefit. ?Get the left heel cord lengthened to allow for the use of an AFO to facilitate better walking. ?Only surgery that may assist the lumbar condition would be resection of the area of discitis and osteomyelitis and arthrodese with bone graft and then then go to the back and place screws and rods, In the face of renal dialysis the risk of infection recurring is great and she is demonstrating gradual autofusion of the area of discitis L4-5. I don't recommend this extensive surgery at this time and continued therapy has been beneficial in improving her overall ?Level of function.  ?Exercise is important to improve your indurance and does allow people to function better inspite of back pain. ?  ?

## 2021-09-01 ENCOUNTER — Telehealth: Payer: Self-pay | Admitting: Family Medicine

## 2021-09-01 ENCOUNTER — Telehealth: Payer: Self-pay | Admitting: Specialist

## 2021-09-01 NOTE — Telephone Encounter (Signed)
Received call from [place] with [facility] asking if Dr. Louanne Skye will sign orders for [facility]PT and [place]? The number to contact Judson Roch is [phone] ?

## 2021-09-01 NOTE — Telephone Encounter (Signed)
Pt is scheduled to be seen here on 5/10 as a new patient. Judson Roch from [facility] is wanting to know once care is established would we be willing to sign off on the PT & OT for home health services? They are probably going to be seeing the patient within the next couple days.  ? ? ?[place], [place] ?

## 2021-09-02 ENCOUNTER — Encounter (HOSPITAL_COMMUNITY): Payer: Medicare Other | Admitting: Physical Therapy

## 2021-09-02 NOTE — Telephone Encounter (Signed)
Advised to fax orders. ?

## 2021-09-04 ENCOUNTER — Encounter (HOSPITAL_COMMUNITY): Payer: Medicare Other

## 2021-09-07 ENCOUNTER — Ambulatory Visit: Payer: Medicare Other | Admitting: Cardiology

## 2021-09-07 ENCOUNTER — Telehealth: Payer: Self-pay | Admitting: Family Medicine

## 2021-09-07 ENCOUNTER — Ambulatory Visit: Payer: Medicare Other | Admitting: Internal Medicine

## 2021-09-07 NOTE — Telephone Encounter (Signed)
I can sign off on PT and OT once care is established.

## 2021-09-07 NOTE — Progress Notes (Deleted)
Clinical Summary [name] is a 41 y.o.female  seen today for follow up of the following medical problems.    1. Palpitations - feeling of heart pounding, few times a week. Very often occurs at night. Can feel sweaty. Can last up to 30 minutes - no coffee, occasional mountain dews, occasional green tea, no energy drinks, no EtoH   - 10/2020 event monitor: rare SVT up to 8 beats, rare PACs and PVCs - infrequent symptoms, last episode was 2 months ago   2. ESRD - Tues, Thurs, Sat - bp's get low with sessions. Does not take bp meds on HD days     3. History of CVA   4. Chronic diastolic HF - fluid managed by dialysis   5. CAD - prior notes mention NSTEMI, had PCI 01/2017 unclear details. Appears has had a stent to RCA and LAD  - no recent chest pain   6. HTN - does not take bp meds on HD days due to issues with low bp's on HD   Past Medical History:  Diagnosis Date   Diabetes mellitus without complication ([place])    Hyperlipidemia    Hypertension    Renal disorder    Stroke [facility])    Vitamin D deficiency      Allergies  Allergen Reactions   Corn-Containing Products     Per MAR   Lisinopril     Per MAR   Morphine And Related     Per MAR     Current Outpatient Medications  Medication Sig Dispense Refill   acetaminophen (TYLENOL) 325 MG tablet Take 650 mg by mouth every 6 (six) hours as needed.     Amino Acids-Protein Hydrolys (PRO-STAT 64 PO) Take by mouth.     amLODipine (NORVASC) 5 MG tablet Take 5 mg by mouth See admin instructions. Sun, Mon, Wed and Fri     ascorbic acid (VITAMIN C) 500 MG tablet Take 500 mg by mouth 2 (two) times daily.     aspirin EC 81 MG tablet Take 81 mg by mouth daily. Swallow whole.     atenolol (TENORMIN) 25 MG tablet Take 0.5 tablets (12.5 mg total) by mouth See admin instructions. Every Sun, Mon, Wed and Fri     atorvastatin (LIPITOR) 80 MG tablet Take 1 tablet by mouth at bedtime. (Patient not taking: Reported on  07/31/2021)     b complex-vitamin c-folic acid (NEPHRO-VITE) 0.8 MG TABS tablet Take 1 tablet by mouth daily.     Biotin 5 MG TABS Take 5 mg by mouth in the morning and at bedtime.     Boric Acid Vaginal 600 MG SUPP Place 1 suppository vaginally at bedtime. Place in vagina daily for 7 days 30 suppository 0   busPIRone (BUSPAR) 5 MG tablet Take 5 mg by mouth 2 (two) times daily. (Patient not taking: Reported on 08/24/2021)     calcium carbonate (TUMS - DOSED IN MG ELEMENTAL CALCIUM) 500 MG chewable tablet Chew 2 tablets by mouth 3 (three) times daily before meals.     cetaphil (CETAPHIL) lotion Apply 1 application topically 2 (two) times daily as needed for dry skin.     Cholecalciferol (VITAMIN D) 50 MCG (2000 UT) CAPS Take 2,000 Units by mouth daily.     clindamycin (CLEOCIN T) 1 % lotion Apply topically.     desonide (DESOWEN) 0.05 % cream SMARTSIG:2 Topical Twice Daily     dicyclomine (BENTYL) 10 MG capsule Take 10 mg by  mouth 3 (three) times daily before meals.     ferrous sulfate 325 (65 FE) MG tablet Take 325 mg by mouth daily.     fosfomycin (MONUROL) 3 g PACK Take by mouth.     HUMALOG 100 UNIT/ML injection Inject 5 Units into the skin 3 (three) times daily before meals.     HYDROcodone-acetaminophen (NORCO/VICODIN) 5-325 MG tablet Take 1 tablet by mouth 2 (two) times daily as needed. (Patient not taking: Reported on 08/24/2021)     insulin glargine (LANTUS SOLOSTAR) 100 UNIT/ML Solostar Pen Inject into the skin.     ketoconazole (NIZORAL) 2 % cream SMARTSIG:1 Topical Daily     Lidocaine 4 % PTCH Apply 1 application. topically daily at 6 (six) AM.     lidocaine-prilocaine (EMLA) cream Apply 1 application topically daily as needed. Apply to HD graft site right arm topically one every tues, thurs, sat for pain.     melatonin 3 MG TABS tablet Take 3 mg by mouth at bedtime.     methimazole (TAPAZOLE) 5 MG tablet Take 1 tablet (5 mg total) by mouth daily. 30 tablet 3   metroNIDAZOLE (METROGEL  VAGINAL) 0.75 % vaginal gel Use 1 applicator in vagina at bedtime for 5 nights then prn 70 g 1   Multiple Vitamins-Minerals (CERTAGEN PO) Take 1 tablet by mouth in the morning.     NYAMYC powder Apply topically. (Patient not taking: Reported on 07/24/2021)     nystatin cream (MYCOSTATIN) Apply topically. (Patient not taking: Reported on 07/24/2021)     ondansetron (ZOFRAN ODT) 4 MG disintegrating tablet Take 1 tablet (4 mg total) by mouth every 8 (eight) hours as needed for nausea or vomiting. 30 tablet 0   pantoprazole (PROTONIX) 40 MG tablet Take 40 mg by mouth in the morning.     polyethylene glycol (MIRALAX / GLYCOLAX) 17 g packet Take 17 g by mouth daily.     rOPINIRole (REQUIP) 0.5 MG tablet Take 0.5 mg by mouth at bedtime.     rOPINIRole (REQUIP) 2 MG tablet Take 2 mg by mouth at bedtime.     SANTYL 250 UNIT/GM ointment Apply 1 application. topically daily.     sennosides-docusate sodium (SENOKOT-S) 8.6-50 MG tablet Take 2 tablets by mouth daily.     simethicone (MYLICON) 272 MG chewable tablet Chew 125 mg by mouth as needed for flatulence.     tamsulosin (FLOMAX) 0.4 MG CAPS capsule Take 1 capsule (0.4 mg total) by mouth daily. 30 capsule 0   thiamine 100 MG tablet Take 100 mg by mouth daily.     tiZANidine (ZANAFLEX) 4 MG tablet Take 4 mg by mouth at bedtime.     traMADol (ULTRAM) 50 MG tablet Take 100 mg by mouth every 6 (six) hours as needed.     zinc sulfate 220 (50 Zn) MG capsule Take 220 mg by mouth daily.     No current facility-administered medications for this visit.     Past Surgical History:  Procedure Laterality Date   cardiac stents     CATARACT EXTRACTION W/PHACO Left 08/29/2020   Procedure: CATARACT EXTRACTION PHACO AND INTRAOCULAR LENS PLACEMENT LEFT EYE;  Surgeon: Baruch Goldmann, MD;  Location: [facility];  Service: Ophthalmology;  Laterality: Left;  left CDE=29.30   CATARACT EXTRACTION W/PHACO Right 11/28/2020   Procedure: CATARACT EXTRACTION PHACO AND INTRAOCULAR LENS  PLACEMENT (IOC);  Surgeon: Baruch Goldmann, MD;  Location: [facility];  Service: Ophthalmology;  Laterality: Right;  CDE 4.35   COLOSTOMY  TUBAL LIGATION     wound on buttocks       Allergies  Allergen Reactions   Corn-Containing Products     Per MAR   Lisinopril     Per MAR   Morphine And Related     Per MAR      Family History  Problem Relation Age of Onset   Cancer Paternal Grandmother    Heart disease Father    Hypertension Father    Diabetes Father    Glaucoma Father    Heart disease Mother    Hypertension Mother    Diabetes Mother    Diabetes Sister        borderline   Hypertension Sister      Social History [name] reports that she has never smoked. She has never used smokeless tobacco. [name] reports no history of alcohol use.   Review of Systems CONSTITUTIONAL: No weight loss, fever, chills, weakness or fatigue.  HEENT: Eyes: No visual loss, blurred vision, double vision or yellow sclerae.No hearing loss, sneezing, congestion, runny nose or sore throat.  SKIN: No rash or itching.  CARDIOVASCULAR:  RESPIRATORY: No shortness of breath, cough or sputum.  GASTROINTESTINAL: No anorexia, nausea, vomiting or diarrhea. No abdominal pain or blood.  GENITOURINARY: No burning on urination, no polyuria NEUROLOGICAL: No headache, dizziness, syncope, paralysis, ataxia, numbness or tingling in the extremities. No change in bowel or bladder control.  MUSCULOSKELETAL: No muscle, back pain, joint pain or stiffness.  LYMPHATICS: No enlarged nodes. No history of splenectomy.  PSYCHIATRIC: No history of depression or anxiety.  ENDOCRINOLOGIC: No reports of sweating, cold or heat intolerance. No polyuria or polydipsia.  Marland Kitchen   Physical Examination There were no vitals filed for this visit. There were no vitals filed for this visit.  Gen: resting comfortably, no acute distress HEENT: no scleral icterus, pupils equal round and reactive, no palptable  cervical adenopathy,  CV Resp: Clear to auscultation bilaterally GI: abdomen is soft, non-tender, non-distended, normal bowel sounds, no hepatosplenomegaly MSK: extremities are warm, no edema.  Skin: warm, no rash Neuro:  no focal deficits Psych: appropriate affect   Diagnostic Studies  [facility] 02/02/2017 Coronary arteries Dominance: right Left main: normal LAD: insignificant LCx: ramus 99% - medium; OM2 70% - medium RCA: insignificant RA: 14 mmHg (mean) RV: 62/ 14 mmHg PA: 62/ 25 42 mmHg (mean) PCW: 25 mmHg (mean) AV O2: 4.6 vol% Cardiac output: 5.0 L/min Cardiac index: 2.7 L/min-m2 PVR: 3.4 Wood units Impressions Elevated right and left sided filling pressures with a preserved cardiac index Significant systemic hypertension in 190-200 mm Hg range Patent stents in the mid LAD and mid RCA Total occlusion of the ramus with faint collaterals Distal small vessel disease in the circumflex and small PL system 20 ml of contrast used with biplane angiography Findings discussed with primary team Recommendations Continue medical therapy for distal small vessel CAD and diuresis/BP control for HFpEF   10/2020 heart monitor Rare supraventricular ectopy. Rare episodes of SVT up to 8 beats Rare ventricular ectopy No symptoms reported     Patch Wear Time:  7 days and 1 hours (2022-06-01T08:59:11-398 to 2022-06-08T10:24:48-0400)   Patient had a min HR of 73 bpm, max HR of 154 bpm, and avg HR of 85 bpm. Predominant underlying rhythm was Sinus Rhythm. 10 Supraventricular Tachycardia runs occurred, the run with the fastest interval lasting 7 beats with a max rate of 154 bpm, the  longest lasting 8 beats with an avg rate  of 125 bpm. Isolated SVEs were rare (<1.0%), SVE Couplets were rare (<1.0%), and SVE Triplets were rare (<1.0%). Isolated VEs were rare (<1.0%), VE Couplets were rare (<1.0%), and no VE Triplets were present.    Assessment and Plan  1. Palpitations - prior monitor with  benign ectopy, very short runs of SVT.  - no recent significant symptoms, she is on atenolol, continue   2. Hyperlipidemia - request labs from dialysis, continue atorvastatin   3. HTN - reasonable bp today, have been limited on management due to low bp's with HD - continue current meds   4. CAD - no recent symptoms, continue current meds      Arnoldo Lenis, M.D

## 2021-09-07 NOTE — Telephone Encounter (Signed)
[facility] [place] [phone]) called in on patient behalf. ? ?Needs verbal orders to start PT with patient for ? ?Strengthening ?Tranfers  ?Balance  ?Home exercise  ?Safety  ? ?1 x week 1 week  ?2 x week  4 weeks ? 1 x week 2 weeks ? ? ?Patient has new patient appt Wednesday 5/10  ?

## 2021-09-07 NOTE — Telephone Encounter (Signed)
I have not had a visit with the patient in the clinic. I can not give verbal orders at this time.

## 2021-09-08 NOTE — Telephone Encounter (Signed)
Error

## 2021-09-08 NOTE — Telephone Encounter (Signed)
Orders were signed by wound clinic.  ?

## 2021-09-08 NOTE — Telephone Encounter (Signed)
Okay 

## 2021-09-08 NOTE — Telephone Encounter (Signed)
Returned call to [facility], they were able to get orders signed by the wound clinic she had been seeing.  ?

## 2021-09-09 ENCOUNTER — Telehealth: Payer: Self-pay | Admitting: Specialist

## 2021-09-09 ENCOUNTER — Encounter (HOSPITAL_BASED_OUTPATIENT_CLINIC_OR_DEPARTMENT_OTHER): Payer: Medicare Other | Admitting: General Surgery

## 2021-09-09 ENCOUNTER — Encounter (HOSPITAL_COMMUNITY): Payer: Medicare Other | Admitting: Physical Therapy

## 2021-09-09 ENCOUNTER — Ambulatory Visit: Payer: Medicare Other | Admitting: Family Medicine

## 2021-09-09 NOTE — Telephone Encounter (Signed)
Received call from [place] (OT) with [facility] needing verbal orders for HHOT 1 Wk 4. The number to contact [place] is [phone] ?

## 2021-09-09 NOTE — Telephone Encounter (Signed)
Monique (PT) from [facility] called requesting verbal orders for home health pt, 1wk 1, 2wk 4, and 1wk 2. Strengthen ,home safe transfers, home exercises, and home safety. Please call Monique at [phone] on secure line. If unable to answer leave detailed message ?

## 2021-09-10 NOTE — Telephone Encounter (Signed)
I called and gave verbal auth to [place].  ?

## 2021-09-10 NOTE — Telephone Encounter (Signed)
I called and gave verbal auth for visits to [facility] ?

## 2021-09-11 ENCOUNTER — Encounter (HOSPITAL_COMMUNITY): Payer: Medicare Other

## 2021-09-14 ENCOUNTER — Ambulatory Visit: Payer: Medicare Other | Admitting: Family Medicine

## 2021-09-14 ENCOUNTER — Encounter (HOSPITAL_COMMUNITY): Payer: Medicare Other | Admitting: Physical Therapy

## 2021-09-16 ENCOUNTER — Encounter (HOSPITAL_COMMUNITY): Payer: Medicare Other | Admitting: Physical Therapy

## 2021-09-17 ENCOUNTER — Telehealth: Payer: Self-pay | Admitting: Specialist

## 2021-09-17 ENCOUNTER — Encounter: Payer: Medicare Other | Attending: Physical Medicine & Rehabilitation | Admitting: Physical Medicine & Rehabilitation

## 2021-09-17 DIAGNOSIS — R5381 Other malaise: Secondary | ICD-10-CM | POA: Insufficient documentation

## 2021-09-17 DIAGNOSIS — Z8673 Personal history of transient ischemic attack (TIA), and cerebral infarction without residual deficits: Secondary | ICD-10-CM | POA: Insufficient documentation

## 2021-09-17 DIAGNOSIS — I890 Lymphedema, not elsewhere classified: Secondary | ICD-10-CM | POA: Insufficient documentation

## 2021-09-17 NOTE — Telephone Encounter (Signed)
noted 

## 2021-09-17 NOTE — Telephone Encounter (Signed)
Ms. Marijean Bravo is the OT working with [name]. She is calling to inform us that she will be discharging [name] Barnett from OT on Wednesday 5/24. Please advise

## 2021-09-19 ENCOUNTER — Emergency Department (HOSPITAL_COMMUNITY): Payer: Medicare Other

## 2021-09-19 ENCOUNTER — Emergency Department (HOSPITAL_COMMUNITY)
Admission: EM | Admit: 2021-09-19 | Discharge: 2021-09-19 | Disposition: A | Discharge status: Home / Self Care | Payer: Medicare Other | Attending: Emergency Medicine | Admitting: Emergency Medicine

## 2021-09-19 ENCOUNTER — Other Ambulatory Visit: Payer: Self-pay

## 2021-09-19 ENCOUNTER — Encounter (HOSPITAL_COMMUNITY): Payer: Self-pay | Admitting: Emergency Medicine

## 2021-09-19 DIAGNOSIS — Z794 Long term (current) use of insulin: Secondary | ICD-10-CM | POA: Diagnosis not present

## 2021-09-19 DIAGNOSIS — Z7984 Long term (current) use of oral hypoglycemic drugs: Secondary | ICD-10-CM | POA: Insufficient documentation

## 2021-09-19 DIAGNOSIS — N186 End stage renal disease: Secondary | ICD-10-CM | POA: Diagnosis not present

## 2021-09-19 DIAGNOSIS — E1122 Type 2 diabetes mellitus with diabetic chronic kidney disease: Secondary | ICD-10-CM | POA: Diagnosis not present

## 2021-09-19 DIAGNOSIS — Z79899 Other long term (current) drug therapy: Secondary | ICD-10-CM | POA: Diagnosis not present

## 2021-09-19 DIAGNOSIS — I120 Hypertensive chronic kidney disease with stage 5 chronic kidney disease or end stage renal disease: Secondary | ICD-10-CM | POA: Insufficient documentation

## 2021-09-19 DIAGNOSIS — Z992 Dependence on renal dialysis: Secondary | ICD-10-CM | POA: Diagnosis present

## 2021-09-19 LAB — I-STAT CHEM 8, ED
BUN: 43 mg/dL — ABNORMAL HIGH (ref 6–20)
Calcium, Ion: 0.95 mmol/L — ABNORMAL LOW (ref 1.15–1.40)
Chloride: 98 mmol/L (ref 98–111)
Creatinine, Ser: 13 mg/dL — ABNORMAL HIGH (ref 0.44–1.00)
Glucose, Bld: 217 mg/dL — ABNORMAL HIGH (ref 70–99)
HCT: 32 % — ABNORMAL LOW (ref 36.0–46.0)
Hemoglobin: 10.9 g/dL — ABNORMAL LOW (ref 12.0–15.0)
Potassium: 4.1 mmol/L (ref 3.5–5.1)
Sodium: 137 mmol/L (ref 135–145)
TCO2: 28 mmol/L (ref 22–32)

## 2021-09-19 LAB — CBC WITH DIFFERENTIAL/PLATELET
Abs Immature Granulocytes: 0.02 10*3/uL (ref 0.00–0.07)
Basophils Absolute: 0.1 10*3/uL (ref 0.0–0.1)
Basophils Relative: 1 %
Eosinophils Absolute: 0.3 10*3/uL (ref 0.0–0.5)
Eosinophils Relative: 4 %
HCT: 31.1 % — ABNORMAL LOW (ref 36.0–46.0)
Hemoglobin: 10 g/dL — ABNORMAL LOW (ref 12.0–15.0)
Immature Granulocytes: 0 %
Lymphocytes Relative: 25 %
Lymphs Abs: 1.7 10*3/uL (ref 0.7–4.0)
MCH: 28.8 pg (ref 26.0–34.0)
MCHC: 32.2 g/dL (ref 30.0–36.0)
MCV: 89.6 fL (ref 80.0–100.0)
Monocytes Absolute: 0.4 10*3/uL (ref 0.1–1.0)
Monocytes Relative: 6 %
Neutro Abs: 4.1 10*3/uL (ref 1.7–7.7)
Neutrophils Relative %: 64 %
Platelets: 263 10*3/uL (ref 150–400)
RBC: 3.47 MIL/uL — ABNORMAL LOW (ref 3.87–5.11)
RDW: 18.3 % — ABNORMAL HIGH (ref 11.5–15.5)
WBC: 6.5 10*3/uL (ref 4.0–10.5)
nRBC: 0 % (ref 0.0–0.2)

## 2021-09-19 NOTE — ED Notes (Signed)
Patient stated that she wanted to leave and that she should have never come here.  Went to discharge patient and flag came up to not send patient to the lobby due to patient living in a facility.  Patient states that she is no longer at that facility and she has called a cab to come pick her up and take her home.

## 2021-09-19 NOTE — ED Triage Notes (Signed)
Patient brought in via EMS from home. Alert and oriented. Airway patent. Patient requesting dialysis. Per patient missed dialysis x2 because she was unable to get ride. Patient is supposed to get dialysis on Tuesday, Thursday, and Saturday. Patient missed Thursday's and today's scheduled dialysis. Denies any shortness of breath. Blood pressure 124/89 in triage.

## 2021-09-19 NOTE — ED Provider Notes (Signed)
[facility] EMERGENCY DEPARTMENT Provider Note   CSN: [id number] Arrival date & time: 09/19/21  1303     History  Chief Complaint  Patient presents with   Other    [name] Barnett Kaylena [name] Barnett is a 41 y.o. female.  HPI Patient presents today to get dialysis.  She states she has been told that if she misses dialysis she needs to come to the hospital for dialysis.  Her last session was 5 days ago, on Tuesday.  She is vague about why she has not been to dialysis, on Thursday or today.  She states she gets her dialysis in [place], [place].  She denies shortness of breath, fever, nausea or vomiting.    Home Medications Prior to Admission medications   Medication Sig Start Date End Date Taking? Authorizing Provider  acetaminophen (TYLENOL) 325 MG tablet Take 650 mg by mouth every 6 (six) hours as needed.    [provider]  Amino Acids-Protein Hydrolys (PRO-STAT 64 PO) Take by mouth.    [provider]  amLODipine (NORVASC) 5 MG tablet Take 5 mg by mouth See admin instructions. Sun, Mon, Wed and Fri    [provider]  ascorbic acid (VITAMIN C) 500 MG tablet Take 500 mg by mouth 2 (two) times daily.    [provider]  aspirin EC 81 MG tablet Take 81 mg by mouth daily. Swallow whole.    [provider]  atenolol (TENORMIN) 25 MG tablet Take 0.5 tablets (12.5 mg total) by mouth See admin instructions. Every Sun, Mon, Wed and Fri 07/01/21   [name], MD  atorvastatin (LIPITOR) 80 MG tablet Take 1 tablet by mouth at bedtime. Patient not taking: Reported on 07/31/2021    [provider]  b complex-vitamin c-folic acid (NEPHRO-VITE) 0.8 MG TABS tablet Take 1 tablet by mouth daily.    [provider]  Biotin 5 MG TABS Take 5 mg by mouth in the morning and at bedtime.    [provider]  Boric Acid Vaginal 600 MG SUPP Place 1 suppository vaginally at bedtime. Place in vagina daily for 7 days 07/13/21   Derrek Monaco  A, NP  busPIRone (BUSPAR) 5 MG tablet Take 5 mg by mouth 2 (two) times daily. Patient not taking: Reported on 08/24/2021    [provider]  calcium carbonate (TUMS - DOSED IN MG ELEMENTAL CALCIUM) 500 MG chewable tablet Chew 2 tablets by mouth 3 (three) times daily before meals.    [provider]  cetaphil (CETAPHIL) lotion Apply 1 application topically 2 (two) times daily as needed for dry skin.    [provider]  Cholecalciferol (VITAMIN D) 50 MCG (2000 UT) CAPS Take 2,000 Units by mouth daily.    [provider]  clindamycin (CLEOCIN T) 1 % lotion Apply topically. 06/24/21   [provider]  desonide (DESOWEN) 0.05 % cream SMARTSIG:2 Topical Twice Daily 06/24/21   [provider]  dicyclomine (BENTYL) 10 MG capsule Take 10 mg by mouth 3 (three) times daily before meals.    [provider]  diphenhydrAMINE (SOMINEX) 25 MG tablet Take by mouth. 12/15/16   [provider]  ferrous sulfate 325 (65 FE) MG tablet Take 325 mg by mouth daily.    [provider]  fosfomycin (MONUROL) 3 g PACK Take by mouth. 06/15/21   [provider]  HUMALOG 100 UNIT/ML injection Inject 5 Units into the skin 3 (three) times daily before meals. 08/24/20   [provider]  HYDROcodone-acetaminophen (NORCO/VICODIN) 5-325 MG tablet Take 1 tablet by mouth 2 (two) times daily as needed. Patient not taking: Reported on 08/24/2021 02/02/21   [provider]  insulin glargine (LANTUS SOLOSTAR) 100 UNIT/ML Solostar Pen Inject into the skin. 08/21/18   [provider]  ketoconazole (NIZORAL) 2 % cream SMARTSIG:1 Topical Daily 06/24/21   [provider]  Lidocaine 4 % PTCH Apply 1 application. topically daily at 6 (six) AM.    [provider]  lidocaine-prilocaine (EMLA) cream Apply 1 application topically daily as needed. Apply to HD graft site right arm topically one every tues, thurs, sat for pain.     [provider]  loperamide (IMODIUM A-D) 2 MG tablet Take by mouth. 12/15/16   [provider]  loratadine (CLARITIN) 10 MG tablet Take by mouth. 06/10/17   [provider]  melatonin 3 MG TABS tablet Take 3 mg by mouth at bedtime. 10/31/18   [provider]  methimazole (TAPAZOLE) 5 MG tablet Take 1 tablet (5 mg total) by mouth daily. 07/31/21   Brita Romp, NP  metroNIDAZOLE (METROGEL VAGINAL) 0.75 % vaginal gel Use 1 applicator in vagina at bedtime for 5 nights then prn 07/06/21   Estill Dooms, NP  Multiple Vitamins-Minerals (CERTAGEN PO) Take 1 tablet by mouth in the morning.    [provider]  [facility] powder Apply topically. Patient not taking: Reported on 07/24/2021 04/09/21   [provider]  nystatin cream (MYCOSTATIN) Apply topically. Patient not taking: Reported on 07/24/2021 02/11/21   [provider]  ondansetron (ZOFRAN ODT) 4 MG disintegrating tablet Take 1 tablet (4 mg total) by mouth every 8 (eight) hours as needed for nausea or vomiting. 12/08/20   McKenzie, [name], MD  pantoprazole (PROTONIX) 40 MG tablet Take 40 mg by mouth in the morning.    [provider]  polyethylene glycol (MIRALAX / GLYCOLAX) 17 g packet Take 17 g by mouth daily.    [provider]  rOPINIRole (REQUIP) 0.5 MG tablet Take 0.5 mg by mouth at bedtime. 12/24/20   [provider]  rOPINIRole (REQUIP) 2 MG tablet Take 2 mg by mouth at bedtime.    [provider]  SANTYL 250 UNIT/GM ointment Apply 1 application. topically daily. 07/16/21   [provider]  sennosides-docusate sodium (SENOKOT-S) 8.6-50 MG tablet Take 2 tablets by mouth daily.    [provider]  simethicone (MYLICON) 244 MG chewable tablet Chew 125 mg by mouth as needed for flatulence.    [provider]  tamsulosin (FLOMAX) 0.4 MG CAPS capsule Take 1 capsule (0.4 mg total) by mouth daily. 12/08/20   McKenzie, [name],  MD  thiamine 100 MG tablet Take 100 mg by mouth daily.    [provider]  tiZANidine (ZANAFLEX) 4 MG tablet Take 4 mg by mouth at bedtime.    [provider]  traMADol (ULTRAM) 50 MG tablet Take 100 mg by mouth every 6 (six) hours as needed. 10/20/20   [provider]  zinc sulfate 220 (50 Zn) MG capsule Take 220 mg by mouth daily.    [provider]      Allergies    Corn-containing products, Lisinopril, and Morphine and related    Review of Systems   Review of Systems  Physical Exam Updated Vital Signs BP (!) 155/114 (BP Location: Left Arm)   Pulse 84   Temp 97.9 F (36.6 C) (Oral)   Resp 18  Ht 5\' 5"  (1.651 m)   Wt 88.5 kg   LMP 09/12/2021   SpO2 100%   BMI 32.45 kg/m  Physical Exam Vitals and nursing note reviewed.  Constitutional:      General: She is not in acute distress.    Appearance: She is well-developed. She is not ill-appearing or toxic-appearing.  HENT:     Head: Normocephalic and atraumatic.     Right Ear: External ear normal.     Left Ear: External ear normal.  Eyes:     Conjunctiva/sclera: Conjunctivae normal.     Pupils: Pupils are equal, round, and reactive to light.  Neck:     Trachea: Phonation normal.  Cardiovascular:     Rate and Rhythm: Normal rate.     Comments: Fistula right upper arm with normal thrill. Pulmonary:     Effort: Pulmonary effort is normal.  Abdominal:     General: There is no distension.  Musculoskeletal:        General: Normal range of motion.     Cervical back: Normal range of motion and neck supple.  Skin:    General: Skin is warm and dry.  Neurological:     Mental Status: She is alert and oriented to person, place, and time.     Cranial Nerves: No cranial nerve deficit.     Sensory: No sensory deficit.     Motor: No abnormal muscle tone.     Coordination: Coordination normal.  Psychiatric:        Behavior: Behavior normal.        Thought Content: Thought content normal.         Judgment: Judgment normal.    ED Results / Procedures / Treatments   Labs (all labs ordered are listed, but only abnormal results are displayed) Labs Reviewed  CBC WITH DIFFERENTIAL/PLATELET - Abnormal; Notable for the following components:      Result Value   RBC 3.47 (*)    Hemoglobin 10.0 (*)    HCT 31.1 (*)    RDW 18.3 (*)    All other components within normal limits  I-STAT CHEM 8, ED - Abnormal; Notable for the following components:   BUN 43 (*)    Creatinine, Ser 13.00 (*)    Glucose, Bld 217 (*)    Calcium, Ion 0.95 (*)    Hemoglobin 10.9 (*)    HCT 32.0 (*)    All other components within normal limits    EKG None  Radiology DG Chest Port 1 View  Result Date: 09/19/2021 CLINICAL DATA:  Missed dialysis. EXAM: PORTABLE CHEST 1 VIEW COMPARISON:  Chest x-ray 01/13/2020 FINDINGS: The heart is enlarged. There central pulmonary vascular congestion. There are reticulonodular opacities in the lateral right mid lung. There is no pleural effusion or pneumothorax. No fractures are seen. IMPRESSION: 1. Cardiomegaly with central pulmonary vascular congestion. 2. Reticulonodular opacities in the right mid lung may be infectious/inflammatory. Follow-up PA and lateral chest x-ray recommended in 4-6 weeks to confirm resolution. Electronically Signed   By: Ronney Asters M.D.   On: 09/19/2021 16:54    Procedures Procedures    Medications Ordered in ED Medications - No data to display  ED Course/ Medical Decision Making/ A&P Clinical Course as of 09/19/21 1813  Sat Sep 19, 2021  1702 Case discussed with nephrology, Dr. Carolin Sicks.  Available case data discussed, abnormal chest x-ray showing pulmonary vascular congestion, vital signs with normal oxygenation on room air.  He states patient can be discharged and  call her facility on Monday for arrangements to be dialyzed. [EW]  1744 Patient relented and allowed her blood to be drawn at 5:16 PM. [EW]  1810 I discussed the findings with the  patient and she stated that she was ready to be discharged [EW]  1812 5:55 PM-I discussed the new findings, i-STAT 8 and CBC with Dr. Carolin Sicks.  He continues to feel that the patient can be discharged and follow-up for dialysis on Monday. [EW]    Clinical Course User Index [EW] Daleen Bo, MD                           Medical Decision Making Patient presents here to the ED today to get dialysis because she missed it 3 days ago and today.  She normally dialyzes in [place], [place].  She denies shortness of breath, chest pain, weakness or dizziness.  Amount and/or Complexity of Data Reviewed Independent Historian:     Details: She is a cogent historian Labs: ordered.    Details: CBC, i-STAT 8 metabolic panel-normal except BUN high, creatinine high, glucose high, calcium low, hemoglobin low Radiology: ordered and independent interpretation performed.    Details: Chest x-ray-pulmonary vascular congestion without pulmonary edema or effusions.  Nonspecific reticulonodular opacities right lung. Discussion of management or test interpretation with external provider(s): Case discussed with nephrologist, Dr. Noel Journey.  He recommends outpatient dialysis.  Risk Decision regarding hospitalization. Risk Details: Patient presenting for evaluation of missed dialysis.  She does not have an oxygen requirement has not pulmonary edema.  Potassium level is normal.  Case was discussed with nephrologist, Dr. Carolin Sicks.  He states that patient does not require dialysis today.  He recommends that she call her facility Monday to arrange for dialysis.           Final Clinical Impression(s) / ED Diagnoses Final diagnoses:  End stage renal disease [facility])    Rx / DC Orders ED Discharge Orders     None         Daleen Bo, MD 09/19/21 1819

## 2021-09-19 NOTE — Discharge Instructions (Signed)
Your potassium level is normal.  Your chest x-ray does not show pulmonary edema.  Dr. Carolin Sicks feels that you can be discharged and call your dialysis unit on Monday morning to arrange for dialysis on Monday.  If you get worse over the weekend, return to the emergency department of your choice.

## 2021-09-21 ENCOUNTER — Telehealth: Payer: Self-pay | Admitting: Family Medicine

## 2021-09-21 NOTE — Telephone Encounter (Signed)
Forwarding to provider for review.

## 2021-09-21 NOTE — Telephone Encounter (Signed)
Please advice  

## 2021-09-21 NOTE — Telephone Encounter (Signed)
[name] Barnett [phone]) w. [facility] called in on patient behalf.  Patient had home health visit today with elevated BP 142/98  Patient has also not been to Dialysis since last Thursday  dues to transportation issues.  Patient did have ER visit on 5/20 and was DC with no need for dialysis that day.  Nurse will come out this afternoon for BP recheck.

## 2021-09-21 NOTE — Telephone Encounter (Signed)
[name] Barnett with [facility] called in on patient.  Patient had afternoon hh visit   Patient still has no dialysis since last tues  Bp 160/100  HR  80  O2 92%  Blister top of right foot, open area  Needs Treatment orders for blister ,  Can clean with sialine or foam Gause 2x week or what provider sees fit ''  patient is seen 3x weeks   Call back info  [phone]

## 2021-09-21 NOTE — Telephone Encounter (Signed)
Pt missed her appt with me to establish care. Please encourage the patient to set up an appointment to establish care.

## 2021-09-22 NOTE — Telephone Encounter (Signed)
Awesome!

## 2021-09-22 NOTE — Telephone Encounter (Signed)
Left vm for pt

## 2021-09-22 NOTE — Telephone Encounter (Signed)
I will prescribe treatment after her visit on 09/25/21.

## 2021-09-25 ENCOUNTER — Ambulatory Visit: Payer: Medicare Other | Admitting: Family Medicine

## 2021-09-30 ENCOUNTER — Other Ambulatory Visit: Payer: Self-pay

## 2021-09-30 ENCOUNTER — Inpatient Hospital Stay (HOSPITAL_COMMUNITY)
Admission: EM | Admit: 2021-09-30 | Discharge: 2021-10-01 | DRG: 291 | Discharge status: Against Medical Advice | Payer: Medicare Other | Attending: Internal Medicine | Admitting: Internal Medicine

## 2021-09-30 ENCOUNTER — Emergency Department (HOSPITAL_COMMUNITY): Payer: Medicare Other

## 2021-09-30 ENCOUNTER — Encounter (HOSPITAL_COMMUNITY): Payer: Self-pay | Admitting: Emergency Medicine

## 2021-09-30 DIAGNOSIS — Z91158 Patient's noncompliance with renal dialysis for other reason: Secondary | ICD-10-CM | POA: Diagnosis not present

## 2021-09-30 DIAGNOSIS — Z9842 Cataract extraction status, left eye: Secondary | ICD-10-CM

## 2021-09-30 DIAGNOSIS — R9431 Abnormal electrocardiogram [ECG] [EKG]: Secondary | ICD-10-CM | POA: Diagnosis present

## 2021-09-30 DIAGNOSIS — E1165 Type 2 diabetes mellitus with hyperglycemia: Secondary | ICD-10-CM | POA: Diagnosis present

## 2021-09-30 DIAGNOSIS — Z79899 Other long term (current) drug therapy: Secondary | ICD-10-CM | POA: Diagnosis not present

## 2021-09-30 DIAGNOSIS — Z9841 Cataract extraction status, right eye: Secondary | ICD-10-CM

## 2021-09-30 DIAGNOSIS — D631 Anemia in chronic kidney disease: Secondary | ICD-10-CM | POA: Diagnosis present

## 2021-09-30 DIAGNOSIS — E0590 Thyrotoxicosis, unspecified without thyrotoxic crisis or storm: Secondary | ICD-10-CM | POA: Diagnosis present

## 2021-09-30 DIAGNOSIS — D72829 Elevated white blood cell count, unspecified: Secondary | ICD-10-CM | POA: Diagnosis present

## 2021-09-30 DIAGNOSIS — J9601 Acute respiratory failure with hypoxia: Secondary | ICD-10-CM | POA: Diagnosis present

## 2021-09-30 DIAGNOSIS — E871 Hypo-osmolality and hyponatremia: Secondary | ICD-10-CM | POA: Diagnosis present

## 2021-09-30 DIAGNOSIS — K219 Gastro-esophageal reflux disease without esophagitis: Secondary | ICD-10-CM | POA: Diagnosis present

## 2021-09-30 DIAGNOSIS — E1122 Type 2 diabetes mellitus with diabetic chronic kidney disease: Secondary | ICD-10-CM | POA: Diagnosis present

## 2021-09-30 DIAGNOSIS — Z961 Presence of intraocular lens: Secondary | ICD-10-CM | POA: Diagnosis present

## 2021-09-30 DIAGNOSIS — I160 Hypertensive urgency: Secondary | ICD-10-CM | POA: Diagnosis present

## 2021-09-30 DIAGNOSIS — Z888 Allergy status to other drugs, medicaments and biological substances status: Secondary | ICD-10-CM | POA: Diagnosis not present

## 2021-09-30 DIAGNOSIS — Z7982 Long term (current) use of aspirin: Secondary | ICD-10-CM | POA: Diagnosis not present

## 2021-09-30 DIAGNOSIS — I69354 Hemiplegia and hemiparesis following cerebral infarction affecting left non-dominant side: Secondary | ICD-10-CM | POA: Diagnosis not present

## 2021-09-30 DIAGNOSIS — L8990 Pressure ulcer of unspecified site, unspecified stage: Secondary | ICD-10-CM | POA: Diagnosis present

## 2021-09-30 DIAGNOSIS — Z5329 Procedure and treatment not carried out because of patient's decision for other reasons: Secondary | ICD-10-CM | POA: Diagnosis not present

## 2021-09-30 DIAGNOSIS — Z5901 Sheltered homelessness: Secondary | ICD-10-CM

## 2021-09-30 DIAGNOSIS — Z885 Allergy status to narcotic agent status: Secondary | ICD-10-CM

## 2021-09-30 DIAGNOSIS — Z794 Long term (current) use of insulin: Secondary | ICD-10-CM | POA: Diagnosis not present

## 2021-09-30 DIAGNOSIS — N186 End stage renal disease: Secondary | ICD-10-CM | POA: Diagnosis present

## 2021-09-30 DIAGNOSIS — I5023 Acute on chronic systolic (congestive) heart failure: Secondary | ICD-10-CM | POA: Diagnosis present

## 2021-09-30 DIAGNOSIS — E875 Hyperkalemia: Secondary | ICD-10-CM | POA: Diagnosis present

## 2021-09-30 DIAGNOSIS — Z992 Dependence on renal dialysis: Secondary | ICD-10-CM | POA: Diagnosis not present

## 2021-09-30 DIAGNOSIS — E785 Hyperlipidemia, unspecified: Secondary | ICD-10-CM | POA: Diagnosis present

## 2021-09-30 DIAGNOSIS — D509 Iron deficiency anemia, unspecified: Secondary | ICD-10-CM | POA: Diagnosis present

## 2021-09-30 DIAGNOSIS — I132 Hypertensive heart and chronic kidney disease with heart failure and with stage 5 chronic kidney disease, or end stage renal disease: Principal | ICD-10-CM | POA: Diagnosis present

## 2021-09-30 DIAGNOSIS — Z933 Colostomy status: Secondary | ICD-10-CM

## 2021-09-30 LAB — BASIC METABOLIC PANEL
Anion gap: 14 (ref 5–15)
BUN: 45 mg/dL — ABNORMAL HIGH (ref 6–20)
CO2: 25 mmol/L (ref 22–32)
Calcium: 9 mg/dL (ref 8.9–10.3)
Chloride: 95 mmol/L — ABNORMAL LOW (ref 98–111)
Creatinine, Ser: 9.69 mg/dL — ABNORMAL HIGH (ref 0.44–1.00)
GFR, Estimated: 5 mL/min — ABNORMAL LOW (ref >60)
Glucose, Bld: 280 mg/dL — ABNORMAL HIGH (ref 70–99)
Potassium: 5.2 mmol/L — ABNORMAL HIGH (ref 3.5–5.1)
Sodium: 134 mmol/L — ABNORMAL LOW (ref 135–145)

## 2021-09-30 LAB — CBC
HCT: 28.5 % — ABNORMAL LOW (ref 36.0–46.0)
HCT: 25.6 % — ABNORMAL LOW (ref 36.0–46.0)
Hemoglobin: 8.9 g/dL — ABNORMAL LOW (ref 12.0–15.0)
Hemoglobin: 8.2 g/dL — ABNORMAL LOW (ref 12.0–15.0)
MCH: 28.4 pg (ref 26.0–34.0)
MCH: 29.2 pg (ref 26.0–34.0)
MCHC: 31.2 g/dL (ref 30.0–36.0)
MCHC: 32 g/dL (ref 30.0–36.0)
MCV: 91.1 fL (ref 80.0–100.0)
MCV: 91.1 fL (ref 80.0–100.0)
Platelets: 280 10*3/uL (ref 150–400)
Platelets: 221 10*3/uL (ref 150–400)
RBC: 3.13 MIL/uL — ABNORMAL LOW (ref 3.87–5.11)
RBC: 2.81 MIL/uL — ABNORMAL LOW (ref 3.87–5.11)
RDW: 18.6 % — ABNORMAL HIGH (ref 11.5–15.5)
RDW: 18.8 % — ABNORMAL HIGH (ref 11.5–15.5)
WBC: 14.5 10*3/uL — ABNORMAL HIGH (ref 4.0–10.5)
WBC: 12.4 10*3/uL — ABNORMAL HIGH (ref 4.0–10.5)
nRBC: 0 % (ref 0.0–0.2)
nRBC: 0 % (ref 0.0–0.2)

## 2021-09-30 LAB — I-STAT VENOUS BLOOD GAS, ED
Acid-Base Excess: 10 mmol/L — ABNORMAL HIGH (ref 0.0–2.0)
Bicarbonate: 30.8 mmol/L — ABNORMAL HIGH (ref 20.0–28.0)
Calcium, Ion: 0.89 mmol/L — CL (ref 1.15–1.40)
HCT: 26 % — ABNORMAL LOW (ref 36.0–46.0)
Hemoglobin: 8.8 g/dL — ABNORMAL LOW (ref 12.0–15.0)
O2 Saturation: 100 %
Potassium: 3.7 mmol/L (ref 3.5–5.1)
Sodium: 136 mmol/L (ref 135–145)
TCO2: 32 mmol/L (ref 22–32)
pCO2, Ven: 28.4 mmHg — ABNORMAL LOW (ref 44–60)
pH, Ven: 7.643 (ref 7.25–7.43)
pO2, Ven: 143 mmHg — ABNORMAL HIGH (ref 32–45)

## 2021-09-30 LAB — CREATININE, SERUM
Creatinine, Ser: 5.38 mg/dL — ABNORMAL HIGH (ref 0.44–1.00)
GFR, Estimated: 10 mL/min — ABNORMAL LOW (ref >60)

## 2021-09-30 LAB — HEPATITIS B SURFACE ANTIGEN: Hepatitis B Surface Ag: NONREACTIVE

## 2021-09-30 MED ORDER — FERROUS SULFATE 325 (65 FE) MG PO TABS: 325.0000 mg | ORAL_TABLET | Freq: Every day | ORAL | Status: DC

## 2021-09-30 MED ORDER — SODIUM CHLORIDE 0.9 % IV SOLN
500.0000 mg | Freq: Once | INTRAVENOUS | Status: AC
  Administered 2021-09-30: 500 mg via INTRAVENOUS
  Filled 2021-09-30: qty 5

## 2021-09-30 MED ORDER — PANTOPRAZOLE SODIUM 40 MG PO TBEC: 40.0000 mg | DELAYED_RELEASE_TABLET | Freq: Every day | ORAL | Status: DC

## 2021-09-30 MED ORDER — SODIUM ZIRCONIUM CYCLOSILICATE 10 G PO PACK: 10.0000 g | PACK | Freq: Two times a day (BID) | ORAL | Status: DC

## 2021-09-30 MED ORDER — HEPARIN SODIUM (PORCINE) 1000 UNIT/ML DIALYSIS
1000.0000 [IU] | INTRAMUSCULAR | Status: DC | PRN
  Administered 2021-10-01: 1000 [IU] via INTRAVENOUS_CENTRAL
  Filled 2021-09-30 (×2): qty 1

## 2021-09-30 MED ORDER — SODIUM CHLORIDE 0.9 % IV SOLN
1.0000 g | Freq: Once | INTRAVENOUS | Status: AC
  Administered 2021-09-30: 1 g via INTRAVENOUS
  Filled 2021-09-30: qty 10

## 2021-09-30 MED ORDER — CHLORHEXIDINE GLUCONATE CLOTH 2 % EX PADS
6.0000 | MEDICATED_PAD | Freq: Every day | CUTANEOUS | Status: DC
  Administered 2021-09-30 – 2021-10-01 (×2): 6 via TOPICAL

## 2021-09-30 MED ORDER — ACETAMINOPHEN 500 MG PO TABS
1000.0000 mg | ORAL_TABLET | Freq: Once | ORAL | Status: AC
  Administered 2021-09-30: 1000 mg via ORAL
  Filled 2021-09-30: qty 2

## 2021-09-30 MED ORDER — ASPIRIN 81 MG PO TBEC: 81.0000 mg | DELAYED_RELEASE_TABLET | Freq: Every day | ORAL | Status: DC

## 2021-09-30 MED ORDER — ATENOLOL 25 MG PO TABS: 12.5000 mg | ORAL_TABLET | ORAL | Status: DC

## 2021-09-30 MED ORDER — METHIMAZOLE 5 MG PO TABS
5.0000 mg | ORAL_TABLET | Freq: Every day | ORAL | Status: DC
  Filled 2021-09-30: qty 1

## 2021-09-30 MED ORDER — AMLODIPINE BESYLATE 5 MG PO TABS: 5.0000 mg | ORAL_TABLET | ORAL | Status: DC

## 2021-09-30 MED ORDER — HEPARIN SODIUM (PORCINE) 5000 UNIT/ML IJ SOLN
5000.0000 [IU] | Freq: Three times a day (TID) | INTRAMUSCULAR | Status: DC
  Administered 2021-09-30: 5000 [IU] via SUBCUTANEOUS
  Filled 2021-09-30: qty 1

## 2021-09-30 NOTE — ED Notes (Signed)
This RN contacted the patient's friend to update them.

## 2021-09-30 NOTE — ED Notes (Signed)
This RN called respiratory because she is requiring 6L o2 via [place] and continually sating in the mid to low 80s. Respiratory connected her to highflow at 6L and she is sating 94% at rest.

## 2021-09-30 NOTE — ED Notes (Signed)
ED Provider at bedside. 

## 2021-09-30 NOTE — H&P (Signed)
History and Physical  [name] Barnett PNT:[id number] DOB: [birth year] DOA: 09/30/2021  Referring physician: Dr. Armandina Gemma, [place] PCP: Julianne Handler, DO  Outpatient Specialists: Nephrology. Patient coming from: Home, via EMS  Chief Complaint: Shortness of breath with  HPI: [name] Barnett is a 41 y.o. female with medical history significant for ESRD HD TTS, still makes urine, medical noncompliance, missed a few sessions of hemodialysis, chronic systolic CHF 08%, history of vertebral osteomyelitis of the lumbar spine, CVA with residual left-sided deficits, type 2 diabetes, hypertension, hyperlipidemia, hyperthyroidism on methimazole, anemia of chronic disease, GERD, who presented to [facility] ED with complaints of sudden onset shortness of breath, this morning.  She missed several sessions of hemodialysis due to lack of transportation.  Upon presentation to the ED, the patient is noted to be volume overload with peripheral edema and hypoxia with O2 saturation in the 80s.  Chest x-ray personally reviewed showing bilateral pleural effusions and pulmonary edema.  She was hemodialyzed in the ED on 09/30/2021.  Initially requiring BiPAP then weaned to 6 L high flow nasal cannula with O2 saturation in the mid 90s.  The patient received 1 dose of Rocephin and IV azithromycin in the ED due to concern for superimposed bacterial pulmonary infection.  EDP requested admission for further evaluation and management.  The patient was admitted to the hospitalist service, [facility].  ED Course: Tmax 98.2.  BP 151/89, pulse 97, respiratory 30, O2 saturation 97% on 6 L.  Lab studies significant for serum sodium 134, potassium 5.2, chloride 95, serum glucose 280, BUN 45, creatinine 1.69, GFR 5.  WBC 14.5, hemoglobin 8.9, platelet count 280.  Review of Systems: Review of systems as noted in the HPI. All other systems reviewed and are negative.   Past Medical History:  Diagnosis Date   Diabetes mellitus without  complication ([place])    Hyperlipidemia    Hypertension    Renal disorder    Stroke [facility])    Vitamin D deficiency    Past Surgical History:  Procedure Laterality Date   cardiac stents     CATARACT EXTRACTION W/PHACO Left 08/29/2020   Procedure: CATARACT EXTRACTION PHACO AND INTRAOCULAR LENS PLACEMENT LEFT EYE;  Surgeon: Baruch Goldmann, MD;  Location: [facility];  Service: Ophthalmology;  Laterality: Left;  left CDE=29.30   CATARACT EXTRACTION W/PHACO Right 11/28/2020   Procedure: CATARACT EXTRACTION PHACO AND INTRAOCULAR LENS PLACEMENT (IOC);  Surgeon: Baruch Goldmann, MD;  Location: [facility];  Service: Ophthalmology;  Laterality: Right;  CDE 4.35   COLOSTOMY     TUBAL LIGATION     wound on buttocks      Social History:  reports that she has never smoked. She has never used smokeless tobacco. She reports that she does not drink alcohol and does not use drugs.   Allergies  Allergen Reactions   Corn-Containing Products     Per MAR   Lisinopril     Per MAR   Morphine And Related     Per MAR    Family History  Problem Relation Age of Onset   Cancer Paternal Grandmother    Heart disease Father    Hypertension Father    Diabetes Father    Glaucoma Father    Heart disease Mother    Hypertension Mother    Diabetes Mother    Diabetes Sister        borderline   Hypertension Sister       Prior to Admission medications   Medication Sig Start Date End Date  Taking? Authorizing Provider  acetaminophen (TYLENOL) 325 MG tablet Take 650 mg by mouth every 6 (six) hours as needed.   Yes [provider]  amLODipine (NORVASC) 5 MG tablet Take 5 mg by mouth See admin instructions. Sun, Mon, Wed and Fri   Yes [provider]  ascorbic acid (VITAMIN C) 500 MG tablet Take 500 mg by mouth 2 (two) times daily.   Yes [provider]  aspirin EC 81 MG tablet Take 81 mg by mouth daily. Swallow whole.   Yes [provider]  atenolol (TENORMIN) 25 MG tablet Take 0.5  tablets (12.5 mg total) by mouth See admin instructions. Every Sun, Mon, Wed and Fri 07/01/21  Yes [name], MD  Cholecalciferol (VITAMIN D) 50 MCG (2000 UT) CAPS Take 2,000 Units by mouth daily.   Yes [provider]  dicyclomine (BENTYL) 10 MG capsule Take 10 mg by mouth 3 (three) times daily before meals.   Yes [provider]  ferrous sulfate 325 (65 FE) MG tablet Take 325 mg by mouth daily.   Yes [provider]  HUMALOG 100 UNIT/ML injection Inject 5 Units into the skin 3 (three) times daily before meals. 08/24/20  Yes [provider]  insulin glargine (LANTUS SOLOSTAR) 100 UNIT/ML Solostar Pen Inject into the skin. 08/21/18  Yes [provider]  loratadine (CLARITIN) 10 MG tablet Take by mouth. 06/10/17  Yes [provider]  Multiple Vitamin (MULTIVITAMIN) tablet Take 1 tablet by mouth daily.   Yes [provider]  ondansetron (ZOFRAN ODT) 4 MG disintegrating tablet Take 1 tablet (4 mg total) by mouth every 8 (eight) hours as needed for nausea or vomiting. 12/08/20  Yes McKenzie, [name], MD  pantoprazole (PROTONIX) 40 MG tablet Take 40 mg by mouth in the morning.   Yes [provider]  traMADol (ULTRAM) 50 MG tablet Take 100 mg by mouth every 6 (six) hours as needed. 10/20/20  Yes [provider]  Amino Acids-Protein Hydrolys (PRO-STAT 64 PO) Take by mouth.    [provider]  atorvastatin (LIPITOR) 80 MG tablet Take 1 tablet by mouth at bedtime. Patient not taking: Reported on 07/31/2021    [provider]  b complex-vitamin c-folic acid (NEPHRO-VITE) 0.8 MG TABS tablet Take 1 tablet by mouth daily.    [provider]  Biotin 5 MG TABS Take 5 mg by mouth in the morning and at bedtime.    [provider]  Boric Acid Vaginal 600 MG SUPP Place 1 suppository vaginally at bedtime. Place in vagina daily for 7 days 07/13/21   [name], NP  busPIRone (BUSPAR) 5 MG tablet Take  5 mg by mouth 2 (two) times daily. Patient not taking: Reported on 08/24/2021    [provider]  calcium carbonate (TUMS - DOSED IN MG ELEMENTAL CALCIUM) 500 MG chewable tablet Chew 2 tablets by mouth 3 (three) times daily before meals.    [provider]  cetaphil (CETAPHIL) lotion Apply 1 application topically 2 (two) times daily as needed for dry skin.    [provider]  clindamycin (CLEOCIN T) 1 % lotion Apply topically. 06/24/21   [provider]  desonide (DESOWEN) 0.05 % cream SMARTSIG:2 Topical Twice Daily 06/24/21   [provider]  diphenhydrAMINE (SOMINEX) 25 MG tablet Take by mouth. 12/15/16   [provider]  fosfomycin (MONUROL) 3 g PACK Take by mouth. 06/15/21   [provider]  HYDROcodone-acetaminophen (NORCO/VICODIN) 5-325 MG tablet  Take 1 tablet by mouth 2 (two) times daily as needed. Patient not taking: Reported on 08/24/2021 02/02/21   [provider]  ketoconazole (NIZORAL) 2 % cream SMARTSIG:1 Topical Daily 06/24/21   [provider]  Lidocaine 4 % PTCH Apply 1 application. topically daily at 6 (six) AM.    [provider]  lidocaine-prilocaine (EMLA) cream Apply 1 application topically daily as needed. Apply to HD graft site right arm topically one every tues, thurs, sat for pain.    [provider]  loperamide (IMODIUM A-D) 2 MG tablet Take by mouth. 12/15/16   [provider]  melatonin 3 MG TABS tablet Take 3 mg by mouth at bedtime. Patient not taking: Reported on 09/30/2021 10/31/18   [provider]  methimazole (TAPAZOLE) 5 MG tablet Take 1 tablet (5 mg total) by mouth daily. 07/31/21   Brita Romp, NP  metroNIDAZOLE (METROGEL VAGINAL) 0.75 % vaginal gel Use 1 applicator in vagina at bedtime for 5 nights then prn 07/06/21   Estill Dooms, NP  Multiple Vitamins-Minerals (CERTAGEN PO) Take 1 tablet by mouth in the morning.    [provider]   [facility] powder Apply topically. Patient not taking: Reported on 07/24/2021 04/09/21   [provider]  nystatin cream (MYCOSTATIN) Apply topically. Patient not taking: Reported on 07/24/2021 02/11/21   [provider]  polyethylene glycol (MIRALAX / GLYCOLAX) 17 g packet Take 17 g by mouth daily.    [provider]  rOPINIRole (REQUIP) 0.5 MG tablet Take 0.5 mg by mouth at bedtime. 12/24/20   [provider]  rOPINIRole (REQUIP) 2 MG tablet Take 2 mg by mouth at bedtime. Patient not taking: Reported on 09/30/2021    [provider]  SANTYL 250 UNIT/GM ointment Apply 1 application. topically daily. 07/16/21   [provider]  sennosides-docusate sodium (SENOKOT-S) 8.6-50 MG tablet Take 2 tablets by mouth daily.    [provider]  simethicone (MYLICON) 742 MG chewable tablet Chew 125 mg by mouth as needed for flatulence.    [provider]  tamsulosin (FLOMAX) 0.4 MG CAPS capsule Take 1 capsule (0.4 mg total) by mouth daily. 12/08/20   McKenzie, [name], MD  thiamine 100 MG tablet Take 100 mg by mouth daily.    [provider]  tiZANidine (ZANAFLEX) 4 MG tablet Take 4 mg by mouth at bedtime.    [provider]  zinc sulfate 220 (50 Zn) MG capsule Take 220 mg by mouth daily.    [provider]    Physical Exam: BP (!) 170/95   Pulse 96   Temp 98 F (36.7 C)   Resp (!) 35   Ht 5\' 5"  (1.651 m)   Wt 97.4 kg   LMP 09/12/2021   SpO2 96%   BMI 35.73 kg/m   General: 41 y.o. year-old female well developed well nourished in no acute distress.  Alert and oriented x3. Cardiovascular: Regular rate and rhythm with no rubs or gallops.  No thyromegaly or JVD noted.  EXTR upper and lower peripheral edema bilaterally.  Right upper extremity AV fistula noted. Respiratory: Mild rales at bases.  No wheezes noted.  Poor inspiratory effort.  Conversational dyspnea. Abdomen: Soft nontender nondistended with normal  bowel sounds x4 quadrants. Muskuloskeletal: No cyanosis or clubbing.  Diffuse peripheral edema noted bilaterally Neuro: CN II-XII intact, strength, sensation, reflexes Skin: No ulcerative lesions noted or rashes Psychiatry: Judgement and insight appear normal. Mood is appropriate for condition and  setting          Labs on Admission:  Basic Metabolic Panel: Recent Labs  Lab 09/30/21 0818  NA 134*  K 5.2*  CL 95*  CO2 25  GLUCOSE 280*  BUN 45*  CREATININE 9.69*  CALCIUM 9.0   Liver Function Tests: No results for input(s): AST, ALT, ALKPHOS, BILITOT, PROT, ALBUMIN in the last 168 hours. No results for input(s): LIPASE, AMYLASE in the last 168 hours. No results for input(s): AMMONIA in the last 168 hours. CBC: Recent Labs  Lab 09/30/21 0818  WBC 14.5*  HGB 8.9*  HCT 28.5*  MCV 91.1  PLT 280   Cardiac Enzymes: No results for input(s): CKTOTAL, CKMB, CKMBINDEX, TROPONINI in the last 168 hours.  BNP (last 3 results) No results for input(s): BNP in the last 8760 hours.  ProBNP (last 3 results) No results for input(s): PROBNP in the last 8760 hours.  CBG: No results for input(s): GLUCAP in the last 168 hours.  Radiological Exams on Admission: DG Chest Port 1 View  Result Date: 09/30/2021 CLINICAL DATA:  Cough with shortness of breath. Missed hemodialysis. EXAM: PORTABLE CHEST 1 VIEW COMPARISON:  Radiographs 09/19/2021 and 01/13/2020.  CT 01/14/2020. FINDINGS: 0819 hours. The heart is enlarged. There are new right-greater-than-left basilar airspace opacities with probable small bilateral pleural effusions. The pulmonary vasculature is indistinct. No evidence of pneumothorax. The bones appear unremarkable.  Telemetry leads overlie the chest. IMPRESSION: New right-greater-than-left basilar airspace opacities and probable small pleural effusions, likely related to volume overload in this clinical context. Differential includes congestive heart failure and pneumonia.  Electronically Signed   By: Richardean Sale M.D.   On: 09/30/2021 08:37    EKG: I independently viewed the EKG done and my findings are as followed: Sinus tachycardia.  Rate of 101.  Nonspecific ST-T changes.  QTc 531.  Assessment/Plan Present on Admission:  Acute respiratory failure with hypoxia (HCC)  Principal Problem:   Acute respiratory failure with hypoxia (HCC)  Acute hypoxic respiratory failure secondary to pulm edema, bilateral pleural effusions. Likely from missing hemodialysis sessions. Not oxygen supplementation at baseline Initially requiring BiPAP, wean down to 6 L high flow nasal cannula Continue to wean off oxygen supplementation as tolerated. Maintain out saturation greater than 90%. Incentive spirometer, flutter valve. Volume status managed with hemodialysis. Last hemodialysis was on 09/30/2021, may benefit from additional hemodialysis sessions, possibly tomorrow. Repeat 2D echo.  Acute on chronic systolic CHF/volume overload/pulmonary edema/bilateral pleural effusions in the setting of missed hemodialysis sessions Obtain BNP Personally reviewed chest x-ray showing increase in pulmonary vascularity suggestive of pulmonary edema.  Bilateral pleural effusions. Repeat 2D echo. Strict I's and O's and daily weight Volume status managed with hemodialysis Resume home cardiac medications  Leukocytosis, rule out active infective process She received Rocephin and IV azithromycin in the ED empirically due to presumptive CAP, poa Obtain procalcitonin level Repeat CBC in the morning Monitor fever curve and WBC  Hyperkalemia Serum potassium 5.2 Start Lokelma 10 g twice daily x2 doses. Repeat renal function test to monitor  Prolonged Qtc Admission twelve-lead EKG with QTc greater than 530. Avoid QTc prolonging agents Optimize magnesium and potassium levels Repeat twelve-lead EKG in the morning.  Mild hypervolemic hyponatremia Presented with serum sodium 134 Repeat  renal panel.  Type 2 diabetes with hyperglycemia Presented with serum glucose of 280 Start insulin sliding scale Obtain hemoglobin A1c  Anemia of chronic disease in the setting of ESRD/iron deficiency anemia. Baseline hemoglobin appears to be 10.0 Presented with  hemoglobin of 8.9 No overt bleeding. Resume home ferrous sulfate Monitor H&H and transfuse hemoglobin less than 8.0.  Hyperthyroidism Resume home methimazole Obtain TSH and free T4  History of CVA with residual left-sided deficits. Fall precautions PT OT assessment in the morning. Resume home antiplatelet, statin.    Critical care time: 65 minutes.     DVT prophylaxis: Subcu heparin 3 times daily  Code Status: Full code  Family Communication: None at bedside  Disposition Plan: Admitted to progressive unit  Consults called: Nephrology consulted by EDP.  Admission status: Inpatient status.   Status is: Inpatient Patient requires at least 2 midnights for further evaluation and treatment of condition.   Kayleen Memos MD [facility] Pager [phone]  If 7PM-7AM, please contact night-coverage [email or web address] Password [email or web address]  09/30/2021, 10:02 PM

## 2021-09-30 NOTE — ED Notes (Signed)
Respiratory called at this time for the patient; patient requests to come off the bipap. RT states that she has tried a few times today and the patient didn't do well either time.

## 2021-09-30 NOTE — Procedures (Signed)
   I was present at this dialysis session, have reviewed the session itself and made  appropriate changes Kelly Splinter MD [place] pager [phone]   09/30/2021, 3:47 PM

## 2021-09-30 NOTE — Progress Notes (Signed)
RT attempted ABG unsuccessfully. RT notified RN.

## 2021-09-30 NOTE — Progress Notes (Signed)
Rt at bedside to assess pt. Pt appeared to be more comfortable and less labored while on BIPAP. RT attempted to take pt off of BIPAP per pt's request. Pt immediately became SOB and desaturated into low 80s even on 6 L [place]. Pt was then placed right back on BIPAP. Pt tolerating at this time.

## 2021-09-30 NOTE — ED Notes (Signed)
Attempted ultrasound guided IV, unsuccessful, patient tolerated fair

## 2021-09-30 NOTE — Progress Notes (Signed)
RN and RT transported pt from ER 32 to ER 47 without complication.

## 2021-09-30 NOTE — ED Provider Notes (Signed)
  Physical Exam  BP (!) 170/95   Pulse 96   Temp 98 F (36.7 C)   Resp (!) 35   Ht 5\' 5"  (1.651 m)   Wt 97.4 kg   LMP 09/12/2021   SpO2 96%   BMI 35.73 kg/m     Procedures  .Critical Care Performed by: Regan Lemming, MD Authorized by: Regan Lemming, MD   Critical care provider statement:    Critical care time (minutes):  30   Critical care was necessary to treat or prevent imminent or life-threatening deterioration of the following conditions:  Respiratory failure   Critical care was time spent personally by me on the following activities:  Development of treatment plan with patient or surrogate, discussions with consultants, evaluation of patient's response to treatment, examination of patient, ordering and review of laboratory studies, ordering and review of radiographic studies, ordering and performing treatments and interventions, pulse oximetry, re-evaluation of patient's condition and review of old charts   Care discussed with: admitting provider    ED Course / MDM    Medical Decision Making Amount and/or Complexity of Data Reviewed Labs: ordered. Radiology: ordered.  Risk OTC drugs. Decision regarding hospitalization.   The patient was reassessed status post dialysis.  She persistently has hypoxic respiratory failure and is requiring between 5 and 6 L O2 via nasal cannula.  She had a chest x-ray prior to dialysis which was concerning for opacities which could reflect pulmonary edema, CHF, developing multifocal pneumonia.  Given the patient's persistent hypoxic respiratory failure status post dialysis, medicine was consulted for admission.  The patient was administered broad-spectrum antibiotics to cover for community-acquired pneumonia.  The patient presented with significant volume overload and did have successful session of dialysis today and has been weaned off of BiPAP but persistently remains hypoxic with O2 saturations in the mid 80s on room air, subsequently  placed back on 6 L O2 via nasal cannula.  The patient may benefit from further diuresis via dialysis tomorrow.  Hospitalist medicine was consulted for admission and accepted the patient in admission. Dr. [place] Crane accepted the patient in admission.       Regan Lemming, MD 09/30/21 [id number]

## 2021-09-30 NOTE — ED Notes (Signed)
Patient called out stating breathing is getting harder. Respiratory called. [place] turned up to 8L [place]. EDP aware and gave order for bipap.

## 2021-09-30 NOTE — ED Triage Notes (Signed)
Per [facility] pt coming from home states she missed dialysis yesterday so last had treatment on Saturday. Right arm restricted. Reports shortness of breath x 8 hours. Patient also has not taken BP meds today. Patient initial oxygen saturation 71% and placed on 6L [place]. 1 nitro given en route.

## 2021-09-30 NOTE — ED Notes (Signed)
Patient placed on 6L 02 via nasal cannula as she was sating in the mid to low 80s.

## 2021-09-30 NOTE — Progress Notes (Signed)
Rt called to bedside to assess pt. Pt is saturating 87% on 8 L [place] and sitting up in bed trying to catch her breath. Pt has fine crackles throughout and breathing in the mid 30s. RT placed pt on BIPAP 12/6 R12 60%. Pt is tolerating well at this time and looks more comfortable at this time. Rt will monitor.

## 2021-09-30 NOTE — ED Provider Notes (Signed)
[facility] EMERGENCY DEPARTMENT Provider Note   CSN: [id number] Arrival date & time: 09/30/21  2440     History {Add pertinent medical, surgical, social history, OB history to HPI:1} Chief Complaint  Patient presents with   Shortness of Breath    [name] Barnett is a 41 y.o. female.   Shortness of Breath   Pt is a 41 y/o female - with history of ESRD on dialysis - T,Th,S, states that she missed her last dialysis session on Saturday, she has recently moved to this area and is actively trying to reestablish care with local nephrology, states she was not feeling that well so did not go to dialysis.  Over the last 12 hours she has had progressive shortness of breath and was found to have an oxygen level in the 70% range on EMS arrival.  They placed her on high flow nasal cannula with some improvement in her oxygen level and gave her nitroglycerin due to severe hypertension with some improvement.  The patient states she is extremely dyspneic at this time  Home Medications Prior to Admission medications   Medication Sig Start Date End Date Taking? Authorizing Provider  acetaminophen (TYLENOL) 325 MG tablet Take 650 mg by mouth every 6 (six) hours as needed.    [provider]  Amino Acids-Protein Hydrolys (PRO-STAT 64 PO) Take by mouth.    [provider]  amLODipine (NORVASC) 5 MG tablet Take 5 mg by mouth See admin instructions. Sun, Mon, Wed and Fri    [provider]  ascorbic acid (VITAMIN C) 500 MG tablet Take 500 mg by mouth 2 (two) times daily.    [provider]  aspirin EC 81 MG tablet Take 81 mg by mouth daily. Swallow whole.    [provider]  atenolol (TENORMIN) 25 MG tablet Take 0.5 tablets (12.5 mg total) by mouth See admin instructions. Every Sun, Mon, Wed and Fri 07/01/21   [name], MD  atorvastatin (LIPITOR) 80 MG tablet Take 1 tablet by mouth at bedtime. Patient not taking: Reported on 07/31/2021     [provider]  b complex-vitamin c-folic acid (NEPHRO-VITE) 0.8 MG TABS tablet Take 1 tablet by mouth daily.    [provider]  Biotin 5 MG TABS Take 5 mg by mouth in the morning and at bedtime.    [provider]  Boric Acid Vaginal 600 MG SUPP Place 1 suppository vaginally at bedtime. Place in vagina daily for 7 days 07/13/21   [name], NP  busPIRone (BUSPAR) 5 MG tablet Take 5 mg by mouth 2 (two) times daily. Patient not taking: Reported on 08/24/2021    [provider]  calcium carbonate (TUMS - DOSED IN MG ELEMENTAL CALCIUM) 500 MG chewable tablet Chew 2 tablets by mouth 3 (three) times daily before meals.    [provider]  cetaphil (CETAPHIL) lotion Apply 1 application topically 2 (two) times daily as needed for dry skin.    [provider]  Cholecalciferol (VITAMIN D) 50 MCG (2000 UT) CAPS Take 2,000 Units by mouth daily.    [provider]  clindamycin (CLEOCIN T) 1 % lotion Apply topically. 06/24/21   [provider]  desonide (DESOWEN) 0.05 % cream SMARTSIG:2 Topical Twice Daily 06/24/21   [provider]  dicyclomine (BENTYL) 10 MG capsule Take 10 mg by mouth 3 (three) times daily before meals.    [provider]  diphenhydrAMINE (SOMINEX) 25 MG tablet Take by  mouth. 12/15/16   [provider]  ferrous sulfate 325 (65 FE) MG tablet Take 325 mg by mouth daily.    [provider]  fosfomycin (MONUROL) 3 g PACK Take by mouth. 06/15/21   [provider]  HUMALOG 100 UNIT/ML injection Inject 5 Units into the skin 3 (three) times daily before meals. 08/24/20   [provider]  HYDROcodone-acetaminophen (NORCO/VICODIN) 5-325 MG tablet Take 1 tablet by mouth 2 (two) times daily as needed. Patient not taking: Reported on 08/24/2021 02/02/21   [provider]  insulin glargine (LANTUS SOLOSTAR) 100 UNIT/ML Solostar Pen Inject into the skin. 08/21/18    [provider]  ketoconazole (NIZORAL) 2 % cream SMARTSIG:1 Topical Daily 06/24/21   [provider]  Lidocaine 4 % PTCH Apply 1 application. topically daily at 6 (six) AM.    [provider]  lidocaine-prilocaine (EMLA) cream Apply 1 application topically daily as needed. Apply to HD graft site right arm topically one every tues, thurs, sat for pain.    [provider]  loperamide (IMODIUM A-D) 2 MG tablet Take by mouth. 12/15/16   [provider]  loratadine (CLARITIN) 10 MG tablet Take by mouth. 06/10/17   [provider]  melatonin 3 MG TABS tablet Take 3 mg by mouth at bedtime. 10/31/18   [provider]  methimazole (TAPAZOLE) 5 MG tablet Take 1 tablet (5 mg total) by mouth daily. 07/31/21   Brita Romp, NP  metroNIDAZOLE (METROGEL VAGINAL) 0.75 % vaginal gel Use 1 applicator in vagina at bedtime for 5 nights then prn 07/06/21   Estill Dooms, NP  Multiple Vitamins-Minerals (CERTAGEN PO) Take 1 tablet by mouth in the morning.    [provider]  [facility] powder Apply topically. Patient not taking: Reported on 07/24/2021 04/09/21   [provider]  nystatin cream (MYCOSTATIN) Apply topically. Patient not taking: Reported on 07/24/2021 02/11/21   [provider]  ondansetron (ZOFRAN ODT) 4 MG disintegrating tablet Take 1 tablet (4 mg total) by mouth every 8 (eight) hours as needed for nausea or vomiting. 12/08/20   McKenzie, [name], MD  pantoprazole (PROTONIX) 40 MG tablet Take 40 mg by mouth in the morning.    [provider]  polyethylene glycol (MIRALAX / GLYCOLAX) 17 g packet Take 17 g by mouth daily.    [provider]  rOPINIRole (REQUIP) 0.5 MG tablet Take 0.5 mg by mouth at bedtime. 12/24/20   [provider]  rOPINIRole (REQUIP) 2 MG tablet Take 2 mg by mouth at bedtime.    [provider]  SANTYL 250 UNIT/GM ointment Apply 1 application. topically daily.  07/16/21   [provider]  sennosides-docusate sodium (SENOKOT-S) 8.6-50 MG tablet Take 2 tablets by mouth daily.    [provider]  simethicone (MYLICON) 259 MG chewable tablet Chew 125 mg by mouth as needed for flatulence.    [provider]  tamsulosin (FLOMAX) 0.4 MG CAPS capsule Take 1 capsule (0.4 mg total) by mouth daily. 12/08/20   McKenzie, [name], MD  thiamine 100 MG tablet Take 100 mg by mouth daily.    [provider]  tiZANidine (ZANAFLEX) 4 MG tablet Take 4 mg by mouth at bedtime.    [provider]  traMADol (ULTRAM) 50 MG tablet Take 100 mg by mouth every 6 (six) hours as needed. 10/20/20   [provider]  zinc sulfate 220 (50 Zn) MG capsule Take 220 mg by mouth daily.  [provider]      Allergies    Corn-containing products, Lisinopril, and Morphine and related    Review of Systems   Review of Systems  Respiratory:  Positive for shortness of breath.   All other systems reviewed and are negative.  Physical Exam Updated Vital Signs BP (!) 167/96 (BP Location: Left Arm)   Pulse 100   Temp 97.9 F (36.6 C) (Oral)   Resp (!) 22   Ht 1.651 m (5\' 5" )   Wt 90.7 kg   LMP 09/12/2021   SpO2 91%   BMI 33.28 kg/m  Physical Exam Vitals and nursing note reviewed.  Constitutional:      General: She is in acute distress.     Appearance: She is well-developed. She is ill-appearing and diaphoretic.  HENT:     Head: Normocephalic and atraumatic.     Mouth/Throat:     Pharynx: No oropharyngeal exudate.  Eyes:     General: No scleral icterus.       Right eye: No discharge.        Left eye: No discharge.     Conjunctiva/sclera: Conjunctivae normal.     Pupils: Pupils are equal, round, and reactive to light.  Neck:     Thyroid: No thyromegaly.     Vascular: No JVD.  Cardiovascular:     Rate and Rhythm: Regular rhythm. Tachycardia present.     Heart sounds: Normal heart sounds. No murmur heard.   No  friction rub. No gallop.  Pulmonary:     Effort: Respiratory distress present.     Breath sounds: Wheezing and rales present.     Comments: Diffuse bilateral rales right greater than left, tachypneic and increased work of breathing Abdominal:     General: Bowel sounds are normal. There is no distension.     Palpations: Abdomen is soft. There is no mass.     Tenderness: There is no abdominal tenderness.  Musculoskeletal:        General: No tenderness. Normal range of motion.     Cervical back: Normal range of motion and neck supple.     Right lower leg: Edema present.     Left lower leg: Edema present.     Comments: Right leg greater than left leg but bilateral edema present  Lymphadenopathy:     Cervical: No cervical adenopathy.  Skin:    General: Skin is warm.     Findings: No erythema or rash.  Neurological:     General: No focal deficit present.     Mental Status: She is alert.     Coordination: Coordination normal.  Psychiatric:        Behavior: Behavior normal.    ED Results / Procedures / Treatments   Labs (all labs ordered are listed, but only abnormal results are displayed) Labs Reviewed - No data to display  EKG EKG Interpretation  Date/Time:  Wednesday Sep 30 2021 08:06:04 EDT Ventricular Rate:  101 PR Interval:  135 QRS Duration: 92 QT Interval:  409 QTC Calculation: 531 R Axis:   106 Text Interpretation: Sinus tachycardia Right axis deviation Low voltage, extremity leads Prolonged QT interval since last EKG Confirmed by Noemi Chapel [phone]) on 09/30/2021 8:09:06 AM  Radiology No results found.  Procedures .Critical Care Performed by: Noemi Chapel, MD Authorized by: Noemi Chapel, MD   Critical care provider statement:    Critical care time (minutes):  45   Critical care time was exclusive of:  Separately billable procedures  and treating other patients and teaching time   Critical care was necessary to treat or prevent imminent or life-threatening  deterioration of the following conditions:  Respiratory failure and renal failure   Critical care was time spent personally by me on the following activities:  Development of treatment plan with patient or surrogate, discussions with consultants, evaluation of patient's response to treatment, examination of patient, ordering and review of laboratory studies, ordering and review of radiographic studies, ordering and performing treatments and interventions, pulse oximetry, re-evaluation of patient's condition, review of old charts and obtaining history from patient or surrogate   I assumed direction of critical care for this patient from another provider in my specialty: no     Care discussed with: admitting provider   Comments:        {Document cardiac monitor, telemetry assessment procedure when appropriate:1}  Medications Ordered in ED Medications - No data to display  ED Course/ Medical Decision Making/ A&P                           Medical Decision Making Amount and/or Complexity of Data Reviewed Labs: ordered. Radiology: ordered.     This patient presents to the ED for concern of SOB, this involves an extensive number of treatment options, and is a complaint that carries with it a high risk of complications and morbidity.  The differential diagnosis includes EKG shows prolonged QT which was not seen on the prior EKG but no signs of other ischemia.  I suspect that this patient has acute pulmonary edema secondary to missed dialysis, rule out hyperkalemia, consult with nephrology for emergent dialysis.  She is currently on 6 L by nasal cannula to maintain oxygen above 90%, IV access was near impossible thus I was asked to place an external jugular IV which was placed on the first attempt in the right external jugular vein.  This patient is critically ill   Co morbidities that complicate the patient evaluation  ESRD on dialysis   Additional history obtained:  Additional history  obtained from EMR External records from outside source obtained and reviewed including a recent episode of needing emergent dialysis when she had missed, this was on May 20 approximately 10 days ago.   Lab Tests:  I Ordered, and personally interpreted labs.  The pertinent results include:  ***   Imaging Studies ordered:  I ordered imaging studies including portable chest x-ray I independently visualized and interpreted imaging which showed pulmonary edema I agree with the radiologist interpretation   Cardiac Monitoring: / EKG:  The patient was maintained on a cardiac monitor.  I personally viewed and interpreted the cardiac monitored which showed an underlying rhythm of: sinus tachycardia   Consultations Obtained:  I requested consultation with the Nephrologist Dr. Candiss Norse,  and discussed lab and imaging findings as well as pertinent plan - they recommend: rapid Dialysis   Problem List / ED Course / Critical interventions / Medication management  Acute pulmonary edema with severe hypertension likely secondary to missed dialysis and end-stage renal disease Medications not necessary at the time of arrival given her blood pressure of 167/96, she received nitroglycerin prehospital with some improvement of this blood pressure. Discussed the case with nephrology was going to come and assist with getting emergent dialysis for this patient. I suspect that her majority of symptoms are related to missing dialysis and not to another underlying condition given no fevers, nonproductive cough, no chest pain but no  other signs of infection in her endorsing missing dialysis I have reviewed the patients home medicines and have made adjustments as needed   Social Determinants of Health:  End-stage renal disease on dialysis Poor access to care   Test / Admission - Considered:  ***   {Document critical care time when appropriate:1} {Document review of labs and clinical decision tools ie  heart score, Chads2Vasc2 etc:1}  {Document your independent review of radiology images, and any outside records:1} {Document your discussion with family members, caretakers, and with consultants:1} {Document social determinants of health affecting pt's care:1} {Document your decision making why or why not admission, treatments were needed:1} Final Clinical Impression(s) / ED Diagnoses Final diagnoses:  None    Rx / DC Orders ED Discharge Orders     None

## 2021-09-30 NOTE — ED Notes (Signed)
RN called hemodialysis to obtain plan for patient receiving treatment. When dialysis nurse is ready patient to be moved to room 47 to have dialysis at bedside.

## 2021-09-30 NOTE — ED Notes (Signed)
Nephrology at bedside

## 2021-09-30 NOTE — Progress Notes (Addendum)
Asked to see for HD.  Missed HD yesterday, here w/ resp distress, pulm edema. On Bipap has improved significantly. 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 She is WC bound now. Her family/ children live in another city w/ the father. Pt is homeless and has a motel here in [place].  She moved to [place] about 3 wks ago and is now getting HD TTS at [facility], for the last 3 wks approx.  She states she didn't have transport set up but now she does. Main c/o SOB, better now. Exam pt calm, bipap+, lungs CTA, bilat R < L lower ext edema. +colostomy. CXR pulm edema mod severity. Plan HD upstairs when next spot is available. Pt is not for admission, pt will be returned to ED when HD completed.   Kelly Splinter, MD 09/30/2021, 10:36 AM

## 2021-10-01 DIAGNOSIS — J9601 Acute respiratory failure with hypoxia: Secondary | ICD-10-CM | POA: Diagnosis not present

## 2021-10-01 DIAGNOSIS — L8990 Pressure ulcer of unspecified site, unspecified stage: Secondary | ICD-10-CM | POA: Insufficient documentation

## 2021-10-01 LAB — RENAL FUNCTION PANEL
Albumin: 3.2 g/dL — ABNORMAL LOW (ref 3.5–5.0)
Anion gap: 13 (ref 5–15)
BUN: 21 mg/dL — ABNORMAL HIGH (ref 6–20)
CO2: 29 mmol/L (ref 22–32)
Calcium: 8.9 mg/dL (ref 8.9–10.3)
Chloride: 95 mmol/L — ABNORMAL LOW (ref 98–111)
Creatinine, Ser: 5.68 mg/dL — ABNORMAL HIGH (ref 0.44–1.00)
GFR, Estimated: 9 mL/min — ABNORMAL LOW (ref >60)
Glucose, Bld: 153 mg/dL — ABNORMAL HIGH (ref 70–99)
Phosphorus: 3.4 mg/dL (ref 2.5–4.6)
Potassium: 4.1 mmol/L (ref 3.5–5.1)
Sodium: 137 mmol/L (ref 135–145)

## 2021-10-01 LAB — PROCALCITONIN: Procalcitonin: 0.71 ng/mL

## 2021-10-01 LAB — TSH: TSH: 0.656 u[IU]/mL (ref 0.350–4.500)

## 2021-10-01 LAB — CBC WITH DIFFERENTIAL/PLATELET
Abs Immature Granulocytes: 0.04 10*3/uL (ref 0.00–0.07)
Basophils Absolute: 0.1 10*3/uL (ref 0.0–0.1)
Basophils Relative: 1 %
Eosinophils Absolute: 0.1 10*3/uL (ref 0.0–0.5)
Eosinophils Relative: 1 %
HCT: 26.7 % — ABNORMAL LOW (ref 36.0–46.0)
Hemoglobin: 8.5 g/dL — ABNORMAL LOW (ref 12.0–15.0)
Immature Granulocytes: 0 %
Lymphocytes Relative: 11 %
Lymphs Abs: 1.4 10*3/uL (ref 0.7–4.0)
MCH: 28.2 pg (ref 26.0–34.0)
MCHC: 31.8 g/dL (ref 30.0–36.0)
MCV: 88.7 fL (ref 80.0–100.0)
Monocytes Absolute: 0.7 10*3/uL (ref 0.1–1.0)
Monocytes Relative: 5 %
Neutro Abs: 10.9 10*3/uL — ABNORMAL HIGH (ref 1.7–7.7)
Neutrophils Relative %: 82 %
Platelets: 231 10*3/uL (ref 150–400)
RBC: 3.01 MIL/uL — ABNORMAL LOW (ref 3.87–5.11)
RDW: 18.7 % — ABNORMAL HIGH (ref 11.5–15.5)
WBC: 13.2 10*3/uL — ABNORMAL HIGH (ref 4.0–10.5)
nRBC: 0 % (ref 0.0–0.2)

## 2021-10-01 LAB — BLOOD GAS, VENOUS
Acid-Base Excess: 11.8 mmol/L — ABNORMAL HIGH (ref 0.0–2.0)
Bicarbonate: 34.8 mmol/L — ABNORMAL HIGH (ref 20.0–28.0)
O2 Saturation: 70.8 %
Patient temperature: 37.2
pCO2, Ven: 38 mmHg — ABNORMAL LOW (ref 44–60)
pH, Ven: 7.57 — ABNORMAL HIGH (ref 7.25–7.43)
pO2, Ven: 40 mmHg (ref 32–45)

## 2021-10-01 LAB — MRSA NEXT GEN BY PCR, NASAL: MRSA by PCR Next Gen: NOT DETECTED

## 2021-10-01 LAB — BRAIN NATRIURETIC PEPTIDE: B Natriuretic Peptide: 1015.6 pg/mL — ABNORMAL HIGH (ref 0.0–100.0)

## 2021-10-01 LAB — T4, FREE: Free T4: 1.41 ng/dL — ABNORMAL HIGH (ref 0.61–1.12)

## 2021-10-01 LAB — HEPATITIS C ANTIBODY: HCV Ab: NONREACTIVE

## 2021-10-01 LAB — HEPATITIS B CORE ANTIBODY, TOTAL: Hep B Core Total Ab: NONREACTIVE

## 2021-10-01 LAB — MAGNESIUM: Magnesium: 2.1 mg/dL (ref 1.7–2.4)

## 2021-10-01 LAB — HEMOGLOBIN A1C
Hgb A1c MFr Bld: 7.8 % — ABNORMAL HIGH (ref 4.8–5.6)
Mean Plasma Glucose: 177.16 mg/dL

## 2021-10-01 LAB — HEPATITIS B SURFACE ANTIBODY,QUALITATIVE: Hep B S Ab: REACTIVE — AB

## 2021-10-01 MED ORDER — DARBEPOETIN ALFA 60 MCG/0.3ML IJ SOSY
60.0000 ug | PREFILLED_SYRINGE | INTRAMUSCULAR | Status: DC
  Administered 2021-10-01: 60 ug via INTRAVENOUS
  Filled 2021-10-01: qty 0.3

## 2021-10-01 MED ORDER — INSULIN ASPART 100 UNIT/ML IJ SOLN: 0.0000 [IU] | Freq: Every day | INTRAMUSCULAR | Status: DC

## 2021-10-01 MED ORDER — ACETAMINOPHEN 325 MG PO TABS
650.0000 mg | ORAL_TABLET | Freq: Four times a day (QID) | ORAL | Status: DC | PRN
Start: 2021-10-01 — End: 2021-10-01
  Administered 2021-10-01 (×2): 650 mg via ORAL
  Filled 2021-10-01 (×2): qty 2

## 2021-10-01 MED ORDER — INSULIN ASPART 100 UNIT/ML IJ SOLN: 0.0000 [IU] | Freq: Three times a day (TID) | INTRAMUSCULAR | Status: DC

## 2021-10-01 MED ORDER — ORAL CARE MOUTH RINSE: 15.0000 mL | Freq: Two times a day (BID) | OROMUCOSAL | Status: DC

## 2021-10-01 MED ORDER — SODIUM CHLORIDE 0.9 % IV SOLN
125.0000 mg | INTRAVENOUS | Status: DC
  Administered 2021-10-01: 125 mg via INTRAVENOUS
  Filled 2021-10-01: qty 10

## 2021-10-01 MED ORDER — CHLORHEXIDINE GLUCONATE CLOTH 2 % EX PADS: 6.0000 | MEDICATED_PAD | Freq: Every day | CUTANEOUS | Status: DC

## 2021-10-01 NOTE — Consult Note (Signed)
Renal Service Consult Note [facility]  [name] Barnett [name] Barnett 10/01/2021 [name] Blazing, MD Requesting Physician: Dr. Marthenia Barnett  Reason for Consult: ESRD pt w/ resp distress HPI: The patient is a 41 y.o. year-old w/ hx of DM2, HTN, ESRD on HD, CVA, colostomy, hx sacral wound who presented to ED w/ SOB and resp distress on 5/31. She had missed HD more than once due to lack of transportation. She is homeless and now lives in a motel. No local family. In ED pt was vol overloaded, CXR showed pulm edema and pt required bipap. She rec'd IV abx for possible bacterial PNA as well. We were asked to see for dialysis. She couldn't tolerate coming off bipap so was dialyzed in the ED yesterday afternoon. 3.3 L UF. She is off bipap today, on HFNC 3 L.   Pt seen in room. Hx provided to me by ED RN was that pt stated yesterday she had been on HD for 5 yrs total and lived in [place], [place].  She had a stroke which was then complicated by a large sacral decub. 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 Her family/ children moved out of town and are living w/ their father. She moved to [place] about 5 wks ago and is set up and getting OP HD at [facility].  She is now homeless and is living in a motel. She had transportation issues regarding getting to/ from OP HD, but as of now she states this has been resolved.    ROS - denies CP, no joint pain, no HA, no blurry vision, no rash, no diarrhea, no nausea/ vomiting, no dysuria, no difficulty voiding   Past Medical History  Past Medical History:  Diagnosis Date   Diabetes mellitus without complication ([place])    Hyperlipidemia    Hypertension    Renal disorder    Stroke [facility])    Vitamin D deficiency    Past Surgical History  Past Surgical History:  Procedure Laterality Date   cardiac stents     CATARACT EXTRACTION W/PHACO Left 08/29/2020   Procedure: CATARACT EXTRACTION PHACO AND INTRAOCULAR LENS PLACEMENT LEFT EYE;  Surgeon: Baruch Goldmann, MD;   Location: [facility];  Service: Ophthalmology;  Laterality: Left;  left CDE=29.30   CATARACT EXTRACTION W/PHACO Right 11/28/2020   Procedure: CATARACT EXTRACTION PHACO AND INTRAOCULAR LENS PLACEMENT (I[facility]);  Surgeon: Baruch Goldmann, MD;  Location: [facility];  Service: Ophthalmology;  Laterality: Right;  CDE 4.35   COLON SURGERY     COLOSTOMY     TUBAL LIGATION     wound on buttocks     Family History  Family History  Problem Relation Age of Onset   Cancer Paternal Grandmother    Heart disease Father    Hypertension Father    Diabetes Father    Glaucoma Father    Heart disease Mother    Hypertension Mother    Diabetes Mother    Diabetes Sister        borderline   Hypertension Sister    Social History  reports that she has never smoked. She has never used smokeless tobacco. She reports that she does not drink alcohol and does not use drugs. Allergies  Allergies  Allergen Reactions   Corn-Containing Products     Per MAR   Lisinopril     Per MAR   Morphine And Related     Per [facility]   Home medications Prior to Admission medications   Medication Sig Start Date  End Date Taking? Authorizing Provider  acetaminophen (TYLENOL) 325 MG tablet Take 650 mg by mouth every 6 (six) hours as needed.   Yes [provider]  amLODipine (NORVASC) 5 MG tablet Take 5 mg by mouth See admin instructions. Sun, Mon, Wed and Fri   Yes [provider]  ascorbic acid (VITAMIN C) 500 MG tablet Take 500 mg by mouth 2 (two) times daily.   Yes [provider]  aspirin EC 81 MG tablet Take 81 mg by mouth daily. Swallow whole.   Yes [provider]  atenolol (TENORMIN) 25 MG tablet Take 0.5 tablets (12.5 mg total) by mouth See admin instructions. Every Sun, Mon, Wed and Fri 07/01/21  Yes [name], MD  Cholecalciferol (VITAMIN D) 50 MCG (2000 UT) CAPS Take 2,000 Units by mouth daily.   Yes [provider]  dicyclomine (BENTYL) 10 MG capsule Take 10 mg by mouth 3 (three)  times daily before meals.   Yes [provider]  ferrous sulfate 325 (65 FE) MG tablet Take 325 mg by mouth daily.   Yes [provider]  HUMALOG 100 UNIT/ML injection Inject 5 Units into the skin 3 (three) times daily before meals. 08/24/20  Yes [provider]  insulin glargine (LANTUS SOLOSTAR) 100 UNIT/ML Solostar Pen Inject into the skin. 08/21/18  Yes [provider]  loratadine (CLARITIN) 10 MG tablet Take by mouth. 06/10/17  Yes [provider]  Multiple Vitamin (MULTIVITAMIN) tablet Take 1 tablet by mouth daily.   Yes [provider]  ondansetron (ZOFRAN ODT) 4 MG disintegrating tablet Take 1 tablet (4 mg total) by mouth every 8 (eight) hours as needed for nausea or vomiting. 12/08/20  Yes McKenzie, [name], MD  pantoprazole (PROTONIX) 40 MG tablet Take 40 mg by mouth in the morning.   Yes [provider]  traMADol (ULTRAM) 50 MG tablet Take 100 mg by mouth every 6 (six) hours as needed. 10/20/20  Yes [provider]  Amino Acids-Protein Hydrolys (PRO-STAT 64 PO) Take by mouth.    [provider]  atorvastatin (LIPITOR) 80 MG tablet Take 1 tablet by mouth at bedtime. Patient not taking: Reported on 07/31/2021    [provider]  b complex-vitamin c-folic acid (NEPHRO-VITE) 0.8 MG TABS tablet Take 1 tablet by mouth daily.    [provider]  Biotin 5 MG TABS Take 5 mg by mouth in the morning and at bedtime.    [provider]  Boric Acid Vaginal 600 MG SUPP Place 1 suppository vaginally at bedtime. Place in vagina daily for 7 days 07/13/21   [name], NP  busPIRone (BUSPAR) 5 MG tablet Take 5 mg by mouth 2 (two) times daily. Patient not taking: Reported on 08/24/2021    [provider]  calcium carbonate (TUMS - DOSED IN MG ELEMENTAL CALCIUM) 500 MG chewable tablet Chew 2 tablets by mouth 3 (three) times daily before meals.    [provider]  cetaphil (CETAPHIL)  lotion Apply 1 application topically 2 (two) times daily as needed for dry skin.    [provider]  clindamycin (CLEOCIN T) 1 % lotion Apply topically. 06/24/21   [provider]  desonide (DESOWEN) 0.05 % cream SMARTSIG:2 Topical Twice Daily 06/24/21   [provider]  diphenhydrAMINE (SOMINEX) 25 MG tablet Take by mouth. 12/15/16   [provider]  fosfomycin (MONUROL) 3 g PACK Take by mouth. 06/15/21   [provider]  HYDROcodone-acetaminophen (NORCO/VICODIN) 5-325  MG tablet Take 1 tablet by mouth 2 (two) times daily as needed. Patient not taking: Reported on 08/24/2021 02/02/21   [provider]  ketoconazole (NIZORAL) 2 % cream SMARTSIG:1 Topical Daily 06/24/21   [provider]  Lidocaine 4 % PTCH Apply 1 application. topically daily at 6 (six) AM.    [provider]  lidocaine-prilocaine (EMLA) cream Apply 1 application topically daily as needed. Apply to HD graft site right arm topically one every tues, thurs, sat for pain.    [provider]  loperamide (IMODIUM A-D) 2 MG tablet Take by mouth. 12/15/16   [provider]  melatonin 3 MG TABS tablet Take 3 mg by mouth at bedtime. Patient not taking: Reported on 09/30/2021 10/31/18   [provider]  methimazole (TAPAZOLE) 5 MG tablet Take 1 tablet (5 mg total) by mouth daily. 07/31/21   Brita Romp, NP  metroNIDAZOLE (METROGEL VAGINAL) 0.75 % vaginal gel Use 1 applicator in vagina at bedtime for 5 nights then prn 07/06/21   Estill Dooms, NP  Multiple Vitamins-Minerals (CERTAGEN PO) Take 1 tablet by mouth in the morning.    [provider]  [facility] powder Apply topically. Patient not taking: Reported on 07/24/2021 04/09/21   [provider]  nystatin cream (MYCOSTATIN) Apply topically. Patient not taking: Reported on 07/24/2021 02/11/21   [provider]  polyethylene glycol (MIRALAX / GLYCOLAX) 17 g packet Take 17 g  by mouth daily.    [provider]  rOPINIRole (REQUIP) 0.5 MG tablet Take 0.5 mg by mouth at bedtime. 12/24/20   [provider]  rOPINIRole (REQUIP) 2 MG tablet Take 2 mg by mouth at bedtime. Patient not taking: Reported on 09/30/2021    [provider]  SANTYL 250 UNIT/GM ointment Apply 1 application. topically daily. 07/16/21   [provider]  sennosides-docusate sodium (SENOKOT-S) 8.6-50 MG tablet Take 2 tablets by mouth daily.    [provider]  simethicone (MYLICON) 081 MG chewable tablet Chew 125 mg by mouth as needed for flatulence.    [provider]  tamsulosin (FLOMAX) 0.4 MG CAPS capsule Take 1 capsule (0.4 mg total) by mouth daily. 12/08/20   McKenzie, [name], MD  thiamine 100 MG tablet Take 100 mg by mouth daily.    [provider]  tiZANidine (ZANAFLEX) 4 MG tablet Take 4 mg by mouth at bedtime.    [provider]  zinc sulfate 220 (50 Zn) MG capsule Take 220 mg by mouth daily.    [provider]     Vitals:   10/01/21 0355 10/01/21 0400 10/01/21 0500 10/01/21 0606  BP: (!) 143/102 (!) 157/92    Pulse: 95   (!) 104  Resp: (!) 25 (!) 25  20  Temp: 99 F (37.2 C)     TempSrc: Oral     SpO2: 90%   95%  Weight:   88.5 kg   Height:       Exam Gen alert, no distress, on [place] O2 No rash, cyanosis or gangrene Sclera anicteric, throat clear  No jvd or bruits Chest clear bilat, no rales RRR no MRG Abd soft ntnd no mass or ascites +bs GU defer MS no joint effusions or deformity Ext 1+ bilat R> L pretib edema Neuro is alert, Ox 3 , nf    RFA AVF+bruit      Home meds include - amlodipine 5, aspirin, atenolol 12.5 non HD days, ferrous sulfate, pantoprazole, methimazole  [facility] HD: [facility]   400/1.5  87kg  2/2 bath  RFA AVF  Hep 1000 - venofer 100 tiw IV, new Rx - mircera 60 q2, new Rx - last HD 5/27, 93.5 > 90.9 kg     Assessment/ Plan: AHRF/ pulm edema - due to missed HD, vol  overload. Had HD yesterday. Plan HD again today to get back on schedule and lower volume further.  ESRD - on HD TTS. HD today as above.  Hyperkalemia - mild, K+ was 5.2 yest. K+ 4.1 today.  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  MBD ckd - CCa and phos in range. No vdra, not sure about binders.  H/o CVA - w/ left sided deficits Hyperthyroidism - taking tapazole     [name] Jonnie Finner  MD 10/01/2021, 6:56 AM Recent Labs  Lab 09/30/21 0818 09/30/21 2304 09/30/21 2335 10/01/21 0104  HGB 8.9* 8.2* 8.8* 8.5*  ALBUMIN  --   --   --  3.2*  CALCIUM 9.0  --   --  8.9  PHOS  --   --   --  3.4  CREATININE 9.69* 5.38*  --  5.68*  K 5.2*  --  3.7 4.1

## 2021-10-01 NOTE — Progress Notes (Signed)
Pt receives out-pt HD at [facility] on TTS. Pt arrives at 10:40 for 10:55 chair time. Clinic social worker reports that pt has been set up with [facility]. Pt will need to call and set-up her dialysis trips which cost $2.50 each way. Met with pt at bedside while pt still in HD unit. Pt confirms that she is aware of approval for [facility] and states she has information to call to make appts. Pt willing to accept phone number from navigator today as well. Pt aware that she needs to contact agency to schedule appt for next out-pt HD appt. Pt informs navigator that she intends to leave the hospital this afternoon (possibly AMA). Pt states she does not want to receive HD several days in a row. Will provide update to nephrologist.   Melven Sartorius Renal Navigator [phone]

## 2021-10-01 NOTE — Progress Notes (Signed)
PROGRESS NOTE    [name] Barnett  BZJ:[id number] DOB: 16-Nov-1980 DOA: 09/30/2021 PCP: Julianne Handler, DO  Outpatient Specialists:     Brief Narrative: Patient is a 41 year old African American female with past medical history significant for ESRD HD TTS, still makes urine, medical noncompliance, missed a few sessions of hemodialysis, chronic systolic CHF 01%, history of vertebral osteomyelitis of the lumbar spine, CVA with residual left-sided deficits, type 2 diabetes, hypertension, hyperlipidemia, hyperthyroidism on methimazole, anemia of chronic disease and GERD.  Patient was admitted to await acute hypoxic respiratory failure is likely secondary to volume overload following noncompliance with renal replacement therapy and hypertensive urgency.  On presentation, mild hyperkalemia (5.2) was noted, prolonged QTc (530 ms), hyperglycemia and hemoglobin of 8.9 g/dL were noted.  Patient underwent first hemodialysis section yesterday.  Patient is currently going for another hemodialysis section in a few minutes.  Patient remains significantly volume overloaded.  Discussed with the nephrology team, Dr. Jonnie Finner.  Also discussed with the patient that she may need another hemodialysis session tomorrow.  Patient is insistent on being discharged home today after hemodialysis.  Patient's outpatient hemodialysis schedule is TTS.  Patient is currently on 6 L of supplemental oxygen.  Patient remains hypoxic.  No other constitutional symptoms endorsed.   Assessment & Plan:   Principal Problem:   Acute respiratory failure with hypoxia (HCC) Active Problems:   Pressure injury of skin   Acute hypoxic respiratory failure secondary to pulm edema, bilateral pleural effusions. -This is from volume overload due to noncompliance with hemodialysis. -Patient has had first hemodialysis session.  Patient is scheduled for the second today. -Suspect patient will also need hemodialysis tomorrow as patient is  still significantly volume overloaded.   -Patient is noncompliant. -Patient is currently on 6 L of supplemental oxygen.  Patient has been able to come out of the BiPAP after first hemodialysis session. -Continue to assess need for hemodialysis after today's session. -Nephrology input is appreciated.    Acute on chronic systolic CHF/volume overload/pulmonary edema/bilateral pleural effusions in the setting of missed hemodialysis sessions -Last echocardiogram was done in November 2020.  Echocardiogram revealed EF of 45 to 50%. -Cardiac BNP is 1015. -Patient still makes urine. -For repeat hemodialysis today. -Continue to optimize volume. -Follow repeat echocardiogram ordered. -Further management will depend on above.     Leukocytosis, rule out active infective process She received Rocephin and IV azithromycin in the ED empirically due to presumptive CAP, poa -Procalcitonin is 0.71.  Query significance.-No documented fever. -No cough or phlegm production. -WBC today is 13.2. -Continue to assess for now.     Hyperkalemia Serum potassium 5.2 on presentation. -Resolved with hemodialysis.  Potassium today is 4.1.    Prolonged Qtc Admission twelve-lead EKG with QTc (531 ms). -Repeat EKG today revealed QTc interval of 520 ms. -Continue to optimize electrolytes. -Continue to monitor QTc interval. -Avoid medications that can prolong QTc interval when possible.   -Repeat EKG in the morning (if patient agrees to stay back at the hospital today).  Mild hypervolemic hyponatremia -Resolved with hemodialysis. -Sodium today is 137.     Type 2 diabetes with hyperglycemia Presented with serum glucose of 280 -Blood sugar control is improving. -Last visualized A1c was in November 2022 (8.1%).    Anemia of chronic disease in the setting of ESRD/iron deficiency anemia. -Hemoglobin Alcohol. -We will defer to the nephrology team.     Hyperthyroidism Resume home methimazole -TSH was 0.656.    History of CVA with residual left-sided deficits. -Remains  at baseline. -Fall precautions -PT OT consulted on admission.     DVT prophylaxis: Subcutaneous heparin 5000 units every 8 hours Code Status: Full code Family Communication:  Disposition Plan: Home eventually   Consultants:  Nephrology  Procedures:  Hemodialysis  Antimicrobials:  None for now   Subjective: Remains short of breath.  Objective: Vitals:   10/01/21 0500 10/01/21 0606 10/01/21 0809 10/01/21 1025  BP:   (!) 165/96   Pulse:  (!) 104 97   Resp:  20 (!) 30   Temp:   98.7 F (37.1 C)   TempSrc:   Oral   SpO2:  95% 92%   Weight: 88.5 kg   87.8 kg  Height:        Intake/Output Summary (Last 24 hours) at 10/01/2021 1030 Last data filed at 10/01/2021 0850 Gross per 24 hour  Intake 246.71 ml  Output 6800 ml  Net -6553.29 ml   Filed Weights   09/30/21 2343 10/01/21 0500 10/01/21 1025  Weight: 88.5 kg 88.5 kg 87.8 kg    Examination:  General exam: Patient remains short of breath.  Patient is on 6 L of supplemental oxygen via nasal cannula.  Patient remains volume overloaded.  Patient is awake, alert and oriented to time, place and person. HEENT: Patient is pale.  No jaundice. Neck: Supple. Lungs: Clear to auscultation anteriorly. CVS: S1-S2. Abdomen: Obese, soft and nontender.  Patient has a colostomy bag on the left side. Neuro: Awake and alert.  Patient moves extremities. Extremities, edema.    Data Reviewed: I have personally reviewed following labs and imaging studies  CBC: Recent Labs  Lab 09/30/21 0818 09/30/21 2304 09/30/21 2335 10/01/21 0104  WBC 14.5* 12.4*  --  13.2*  NEUTROABS  --   --   --  10.9*  HGB 8.9* 8.2* 8.8* 8.5*  HCT 28.5* 25.6* 26.0* 26.7*  MCV 91.1 91.1  --  88.7  PLT 280 221  --  379   Basic Metabolic Panel: Recent Labs  Lab 09/30/21 0818 09/30/21 2304 09/30/21 2335 10/01/21 0104  NA 134*  --  136 137  K 5.2*  --  3.7 4.1  CL 95*  --   --  95*  CO2  25  --   --  29  GLUCOSE 280*  --   --  153*  BUN 45*  --   --  21*  CREATININE 9.69* 5.38*  --  5.68*  CALCIUM 9.0  --   --  8.9  MG  --   --   --  2.1  PHOS  --   --   --  3.4   GFR: Estimated Creatinine Clearance: 14.1 mL/min (A) (by C-G formula based on SCr of 5.68 mg/dL (H)). Liver Function Tests: Recent Labs  Lab 10/01/21 0104  ALBUMIN 3.2*   No results for input(s): LIPASE, AMYLASE in the last 168 hours. No results for input(s): AMMONIA in the last 168 hours. Coagulation Profile: No results for input(s): INR, PROTIME in the last 168 hours. Cardiac Enzymes: No results for input(s): CKTOTAL, CKMB, CKMBINDEX, TROPONINI in the last 168 hours. BNP (last 3 results) No results for input(s): PROBNP in the last 8760 hours. HbA1C: No results for input(s): HGBA1C in the last 72 hours. CBG: No results for input(s): GLUCAP in the last 168 hours. Lipid Profile: No results for input(s): CHOL, HDL, LDLCALC, TRIG, CHOLHDL, LDLDIRECT in the last 72 hours. Thyroid Function Tests: Recent Labs    10/01/21 0104  TSH 0.656  FREET4 1.41*   Anemia Panel: No results for input(s): VITAMINB12, FOLATE, FERRITIN, TIBC, IRON, RETICCTPCT in the last 72 hours. Urine analysis:    Component Value Date/Time   COLORURINE AMBER (A) 12/01/2020 2301   APPEARANCEUR Cloudy (A) 12/08/2020 1108   LABSPEC 1.013 12/01/2020 2301   PHURINE 8.0 12/01/2020 2301   GLUCOSEU Negative 12/08/2020 1108   HGBUR MODERATE (A) 12/01/2020 2301   BILIRUBINUR Negative 12/08/2020 1108   [place] 12/01/2020 2301   PROTEINUR 3+ (A) 12/08/2020 1108   PROTEINUR >=300 (A) 12/01/2020 2301   NITRITE Negative 12/08/2020 1108   NITRITE NEGATIVE 12/01/2020 2301   LEUKOCYTESUR 3+ (A) 12/08/2020 1108   LEUKOCYTESUR MODERATE (A) 12/01/2020 2301   Sepsis Labs: @LABRCNTIP (procalcitonin:4,lacticidven:4)  ) Recent Results (from the past 240 hour(s))  MRSA Next Gen by PCR, Nasal     Status: None   Collection Time:  09/30/21 11:45 PM   Specimen: Nasal Mucosa; Nasal Swab  Result Value Ref Range Status   MRSA by PCR Next Gen NOT DETECTED NOT DETECTED Final    Comment: (NOTE) The GeneXpert MRSA Assay (FDA approved for NASAL specimens only), is one component of a comprehensive MRSA colonization surveillance program. It is not intended to diagnose MRSA infection nor to guide or monitor treatment for MRSA infections. Test performance is not FDA approved in patients less than 33 years old. Performed at [facility], [place] [address], [place], [place] [address]          Radiology Studies: DG Chest Port 1 View  Result Date: 09/30/2021 CLINICAL DATA:  Cough with shortness of breath. Missed hemodialysis. EXAM: PORTABLE CHEST 1 VIEW COMPARISON:  Radiographs 09/19/2021 and 01/13/2020.  CT 01/14/2020. FINDINGS: 0819 hours. The heart is enlarged. There are new right-greater-than-left basilar airspace opacities with probable small bilateral pleural effusions. The pulmonary vasculature is indistinct. No evidence of pneumothorax. The bones appear unremarkable.  Telemetry leads overlie the chest. IMPRESSION: New right-greater-than-left basilar airspace opacities and probable small pleural effusions, likely related to volume overload in this clinical context. Differential includes congestive heart failure and pneumonia. Electronically Signed   By: Richardean Sale M.D.   On: 09/30/2021 08:37        Scheduled Meds:  [START ON 10/02/2021] amLODipine  5 mg Oral Once per day on Sun Mon Wed Fri   aspirin EC  81 mg Oral Daily   [START ON 10/02/2021] atenolol  12.5 mg Oral Once per day on Sun Mon Wed Fri   Chlorhexidine Gluconate Cloth  6 each Topical Q0600   Chlorhexidine Gluconate Cloth  6 each Topical Q0600   darbepoetin (ARANESP) injection - DIALYSIS  60 mcg Intravenous Q Thu-HD   ferrous sulfate  325 mg Oral Q breakfast   heparin  5,000 Units Subcutaneous Q8H   mouth rinse  15 mL Mouth Rinse BID   methimazole  5  mg Oral Daily   pantoprazole  40 mg Oral Daily   Continuous Infusions:  ferric gluconate (FERRLECIT) IVPB       LOS: 1 day    Time spent: Greater than 35 minutes.    Dana Allan, MD  [facility] Pager #: [phone] 7PM-7AM contact night coverage as above

## 2021-10-01 NOTE — TOC Progression Note (Signed)
Transition of Care [facility]) - Progression Note    Patient Details  Name: [name] Barnett MRN: [id number] Date of Birth: 10/28/1980  Transition of [facility]) CM/SW [place], RN Phone Number:[phone]  10/01/2021, 3:51 PM  Clinical Narrative:    CM received all from [place] with [facility]. Patient is active with [facility] for RN/PT/OT        Expected Discharge Plan and Services                                                 Social Determinants of Health (SDOH) Interventions    Readmission Risk Interventions     View : No data to display.

## 2021-10-01 NOTE — Procedures (Signed)
   I was present at this dialysis session, have reviewed the session itself and made  appropriate changes Kelly Splinter MD [facility] pager [phone]   10/01/2021, 12:22 PM

## 2021-10-01 NOTE — Progress Notes (Signed)
Pt is refusing "all needles" with exception to HD treatments.  MD notified

## 2021-10-01 NOTE — Progress Notes (Signed)
Pt states she is leaving AMA  MD notified  Form signed and in chart  IV removed

## 2021-10-02 ENCOUNTER — Telehealth: Payer: Self-pay | Admitting: Nephrology

## 2021-10-02 LAB — HEPATITIS B SURFACE ANTIBODY, QUANTITATIVE
Hep B S AB Quant (Post): 201.6 m[IU]/mL (ref >9.9)
Hep B S AB Quant (Post): 225.9 m[IU]/mL (ref >9.9)

## 2021-10-02 NOTE — Telephone Encounter (Signed)
Transition of care contact from inpatient facility  Date of discharge: 10/01/21 Date of contact: 10/02/21 Method: Phone Spoke to: Patient  Patient contacted to discuss transition of care from recent inpatient hospitalization. Patient was admitted to [facility] from 5/31 -10/01/21 with discharge diagnosis of acute hypoxic respiratory failure secondary to pulmonary edema.   Patient left AMA. No medication changes to review.   She states she feels fine and will follow up with her outpatient HD unit on: Saturday 6/3.

## 2021-10-02 NOTE — Progress Notes (Signed)
Late Entry note:  Pt left AMA last evening. Contacted [facility] and spoke to Mildred. Clinic aware of the above and that pt should resume on Saturday.   Melven Sartorius Renal Navigator [phone]

## 2021-10-05 ENCOUNTER — Ambulatory Visit: Payer: Medicare Other | Admitting: Nurse Practitioner

## 2021-10-09 ENCOUNTER — Telehealth: Payer: Self-pay | Admitting: Specialist

## 2021-10-09 ENCOUNTER — Ambulatory Visit: Payer: Medicare Other | Admitting: Specialist

## 2021-10-09 NOTE — Telephone Encounter (Signed)
I called and gave verbal auth to continue HHPT,

## 2021-10-09 NOTE — Telephone Encounter (Signed)
Amy called. She is the PT working in home with patient. She would like verbal orders to continue home PT. Her call back number is [phone]

## 2021-10-12 ENCOUNTER — Ambulatory Visit: Payer: Medicare Other | Admitting: Specialist

## 2021-10-14 ENCOUNTER — Telehealth: Payer: Self-pay

## 2021-10-14 NOTE — Telephone Encounter (Signed)
[name] Barnett who is patient's home health nurse states that patient is needing a referral for a Education officer, museum. States that patient has been living with a Environmental education officer and his wife however they are wanting her to be removed from the home, when his happens she will have no place to stay.

## 2021-10-20 NOTE — Telephone Encounter (Signed)
I called and gave [name] Barnett verbal [name] Barnett for Social worker to come in and evaluate the situation

## 2021-10-23 ENCOUNTER — Other Ambulatory Visit: Payer: Self-pay | Admitting: Specialist

## 2021-10-23 MED ORDER — ATENOLOL 25 MG PO TABS: 12.5000 mg | ORAL_TABLET | ORAL | 3 refills | Status: AC

## 2021-10-23 MED ORDER — AMLODIPINE BESYLATE 5 MG PO TABS: 5.0000 mg | ORAL_TABLET | ORAL | 3 refills | Status: DC

## 2021-10-23 MED ORDER — AMLODIPINE BESYLATE 5 MG PO TABS: 5.0000 mg | ORAL_TABLET | ORAL | 3 refills | Status: AC

## 2021-10-23 MED ORDER — ATENOLOL 25 MG PO TABS: 12.5000 mg | ORAL_TABLET | ORAL | Status: DC

## 2021-10-27 ENCOUNTER — Telehealth: Payer: Self-pay | Admitting: Specialist

## 2021-10-27 NOTE — Telephone Encounter (Signed)
[name] Barnett (PT) called from [facility]. She states they are discharging pt due to the fact that pt never let physical therapy in or answer there call. Just an FYI. If any question feel free to call [name] Barnett at [phone].

## 2021-11-04 ENCOUNTER — Ambulatory Visit (HOSPITAL_BASED_OUTPATIENT_CLINIC_OR_DEPARTMENT_OTHER): Payer: Medicare Other | Admitting: General Surgery

## 2021-11-09 NOTE — Discharge Summary (Signed)
Physician Discharge Summary  Patient ID: [name] Barnett MRN: [id number] DOB/AGE: 05-06-1980 41 y.o.  Admit date: 09/30/2021 Discharge date: 10/01/2021  Admission Diagnoses:  Discharge Diagnoses:  Principal Problem:   Acute respiratory failure with hypoxia ([name] Barnett) Active Problems:   Pressure injury of skin   Discharged Condition: stable  Hospital Course: Patient is a 41 year old African American female with past medical history significant for ESRD ON HEMODIALYSIS TTS (patient still makes urine), medical noncompliance (missed a few sessions of hemodialysis), chronic systolic CHF 43%, history of vertebral osteomyelitis of the lumbar spine, CVA with residual left-sided deficits, type 2 diabetes, hypertension, hyperlipidemia, hyperthyroidism on methimazole, anemia of chronic disease and GERD.  Patient was admitted with acute hypoxic respiratory failure that was likely secondary to volume overload following noncompliance with renal replacement therapy and hypertensive urgency.  On presentation, mild hyperkalemia (5.2) was noted, prolonged QTc (530 ms), hyperglycemia and hemoglobin of 8.9 g/dL were noted.  Patient underwent first hemodialysis section yesterday.  Patient is currently going for another hemodialysis section in a few minutes.  Patient remains significantly volume overloaded.  Discussed with the nephrology team, Dr. Jonnie Finner.  Also discussed with the patient that she may need another hemodialysis session tomorrow.  Patient remained insistent that she would leave the hospital today after hemodialysis.  Patient's outpatient hemodialysis schedule is TTS.    Acute hypoxic respiratory failure secondary to pulm edema, bilateral pleural effusions. -This is from volume overload due to noncompliance with hemodialysis. -Patient has had first hemodialysis session.  Patient is scheduled for the second today. -Suspect patient will also need hemodialysis tomorrow as patient is still significantly  volume overloaded.   -Patient is noncompliant. -Patient is currently on 6 L of supplemental oxygen.  Patient has been able to come out of the BiPAP after first hemodialysis session. -Continue to assess need for hemodialysis after today's session. -Nephrology input is appreciated.    Acute on chronic systolic CHF/volume overload/pulmonary edema/bilateral pleural effusions in the setting of missed hemodialysis sessions -Last echocardiogram was done in November 2020.  Echocardiogram revealed EF of 45 to 50%. -Cardiac BNP is 1015. -Patient still makes urine. -For repeat hemodialysis today. -Continue to optimize volume. -Follow repeat echocardiogram ordered. -Further management will depend on above.     Leukocytosis, rule out active infective process She received Rocephin and IV azithromycin in the ED empirically due to presumptive CAP, poa -Procalcitonin is 0.71.  Query significance.-No documented fever. -No cough or phlegm production. -WBC today is 13.2. -Continue to assess for now.     Hyperkalemia Serum potassium 5.2 on presentation. -Resolved with hemodialysis.  Potassium today is 4.1.     Prolonged Qtc Admission twelve-lead EKG with QTc (531 ms). -Repeat EKG today revealed QTc interval of 520 ms. -Continue to optimize electrolytes. -Continue to monitor QTc interval. -Avoid medications that can prolong QTc interval when possible.   -Repeat EKG in the morning (if patient agrees to stay back at the hospital today).   Mild hypervolemic hyponatremia -Resolved with hemodialysis. -Sodium today is 137.     Type 2 diabetes with hyperglycemia Presented with serum glucose of 280 -Blood sugar control is improving. -Last visualized A1c was in November 2022 (8.1%).     Anemia of chronic disease in the setting of ESRD/iron deficiency anemia. -Hemoglobin Alcohol. -We will defer to the nephrology team.     Hyperthyroidism Resume home methimazole -TSH was 0.656.   History of CVA  with residual left-sided deficits. -Remains at baseline. -Fall precautions -PT OT consulted on admission.  Consults: nephrology   Discharge Exam: Blood pressure (!) 181/109, pulse 94, temperature 98.1 F (36.7 C), temperature source Oral, resp. rate 20, height 5\' 5"  (1.651 m), weight 85.4 kg, last menstrual period 09/12/2021, SpO2 98 %.   Disposition:    Allergies as of 10/01/2021       Reactions   Corn-containing Products Anaphylaxis   Lisinopril Swelling, Other (See Comments)   hairloss   Morphine And Related Other (See Comments)   hallucinations        Medication List     ASK your doctor about these medications    acetaminophen 325 MG tablet Commonly known as: TYLENOL Take 650 mg by mouth 2 (two) times daily as needed (pain).   ascorbic acid 500 MG tablet Commonly known as: VITAMIN C Take 500 mg by mouth daily.   aspirin EC 81 MG tablet Take 81 mg by mouth daily. Swallow whole.   atorvastatin 80 MG tablet Commonly known as: LIPITOR Take 1 tablet by mouth at bedtime.   b complex-vitamin c-folic acid 0.8 MG Tabs tablet Take 1 tablet by mouth daily.   Biotin 5 MG Tabs Take 5 mg by mouth in the morning and at bedtime.   Boric Acid Vaginal 600 MG Supp Place 1 suppository vaginally at bedtime. Place in vagina daily for 7 days   busPIRone 5 MG tablet Commonly known as: BUSPAR Take 5 mg by mouth 2 (two) times daily.   calcium carbonate 500 MG chewable tablet Commonly known as: TUMS - dosed in mg elemental calcium Chew 2 tablets by mouth See admin instructions. 2 tablets by mouth 2-3 times a day before meals   CERTAGEN PO Take 1 tablet by mouth in the morning.   cetaphil lotion Apply 1 application. topically 2 (two) times daily as needed (for rash on face).   clindamycin 1 % lotion Commonly known as: CLEOCIN T Apply topically.   desonide 0.05 % cream Commonly known as: DESOWEN SMARTSIG:2 Topical Twice Daily   dicyclomine 10 MG  capsule Commonly known as: BENTYL Take 10 mg by mouth 3 (three) times daily before meals.   diphenhydrAMINE 25 MG tablet Commonly known as: SOMINEX Take by mouth.   ferrous sulfate 325 (65 FE) MG tablet Take 325 mg by mouth daily.   fosfomycin 3 g Pack Commonly known as: MONUROL Take by mouth.   HumaLOG 100 UNIT/ML injection Generic drug: insulin lispro Inject 5 Units into the skin 3 (three) times daily with meals.   ketoconazole 2 % cream Commonly known as: NIZORAL SMARTSIG:1 Topical Daily   Lantus SoloStar 100 UNIT/ML Solostar Pen Generic drug: insulin glargine Inject 10 Units into the skin daily.   lidocaine 4 % Apply 1 application. topically daily at 6 (six) AM.   lidocaine-prilocaine cream Commonly known as: EMLA Apply 1 application topically daily as needed. Apply to HD graft site right arm topically one every tues, thurs, sat for pain.   loperamide 2 MG tablet Commonly known as: IMODIUM A-D Take by mouth.   loratadine 10 MG tablet Commonly known as: CLARITIN Take 10 mg by mouth daily.   melatonin 3 MG Tabs tablet Take 3 mg by mouth at bedtime.   methimazole 5 MG tablet Commonly known as: TAPAZOLE Take 1 tablet (5 mg total) by mouth daily.   metroNIDAZOLE 0.75 % vaginal gel Commonly known as: METROGEL VAGINAL Use 1 applicator in vagina at bedtime for 5 nights then prn   multivitamin tablet Take 1 tablet by mouth daily.   nystatin  cream Commonly known as: MYCOSTATIN Apply topically.   Nyamyc powder Generic drug: nystatin Apply 1 application. topically 2 (two) times daily.   ondansetron 4 MG disintegrating tablet Commonly known as: Zofran ODT Take 1 tablet (4 mg total) by mouth every 8 (eight) hours as needed for nausea or vomiting.   pantoprazole 40 MG tablet Commonly known as: PROTONIX Take 40 mg by mouth in the morning.   polyethylene glycol 17 g packet Commonly known as: MIRALAX / GLYCOLAX Take 17 g by mouth daily.   PRO-STAT 64  PO Take 1 tablet by mouth daily.   rOPINIRole 2 MG tablet Commonly known as: REQUIP Take 2 mg by mouth at bedtime.   rOPINIRole 0.5 MG tablet Commonly known as: REQUIP Take 0.5 mg by mouth at bedtime.   Santyl 250 UNIT/GM ointment Generic drug: collagenase Apply 1 application. topically daily.   sennosides-docusate sodium 8.6-50 MG tablet Commonly known as: SENOKOT-S Take 2 tablets by mouth daily.   simethicone 125 MG chewable tablet Commonly known as: MYLICON Chew 384 mg by mouth as needed for flatulence.   tamsulosin 0.4 MG Caps capsule Commonly known as: FLOMAX Take 1 capsule (0.4 mg total) by mouth daily.   thiamine 100 MG tablet Take 100 mg by mouth daily.   tiZANidine 4 MG tablet Commonly known as: ZANAFLEX Take 4 mg by mouth at bedtime.   traMADol 50 MG tablet Commonly known as: ULTRAM Take 100 mg by mouth 2 (two) times daily as needed (pain).   Vitamin D 50 MCG (2000 UT) Caps Take 2,000 Units by mouth daily.   zinc sulfate 220 (50 Zn) MG capsule Take 220 mg by mouth daily.         SignedBonnell Public 11/09/2021, 12:24 AM

## 2021-11-11 ENCOUNTER — Ambulatory Visit (HOSPITAL_BASED_OUTPATIENT_CLINIC_OR_DEPARTMENT_OTHER): Payer: Medicare Other | Admitting: General Surgery

## 2021-11-18 ENCOUNTER — Ambulatory Visit: Payer: Medicare Other | Admitting: Surgery

## 2021-11-25 ENCOUNTER — Encounter (HOSPITAL_BASED_OUTPATIENT_CLINIC_OR_DEPARTMENT_OTHER): Payer: Medicare Other | Attending: General Surgery | Admitting: General Surgery

## 2021-11-25 DIAGNOSIS — E1140 Type 2 diabetes mellitus with diabetic neuropathy, unspecified: Secondary | ICD-10-CM | POA: Diagnosis not present

## 2021-11-25 DIAGNOSIS — I5032 Chronic diastolic (congestive) heart failure: Secondary | ICD-10-CM | POA: Diagnosis not present

## 2021-11-25 DIAGNOSIS — N186 End stage renal disease: Secondary | ICD-10-CM | POA: Insufficient documentation

## 2021-11-25 DIAGNOSIS — Z992 Dependence on renal dialysis: Secondary | ICD-10-CM | POA: Insufficient documentation

## 2021-11-25 DIAGNOSIS — L89152 Pressure ulcer of sacral region, stage 2: Secondary | ICD-10-CM | POA: Insufficient documentation

## 2021-11-25 DIAGNOSIS — E11622 Type 2 diabetes mellitus with other skin ulcer: Secondary | ICD-10-CM | POA: Insufficient documentation

## 2021-11-25 DIAGNOSIS — I132 Hypertensive heart and chronic kidney disease with heart failure and with stage 5 chronic kidney disease, or end stage renal disease: Secondary | ICD-10-CM | POA: Diagnosis not present

## 2021-11-25 DIAGNOSIS — Z8673 Personal history of transient ischemic attack (TIA), and cerebral infarction without residual deficits: Secondary | ICD-10-CM | POA: Insufficient documentation

## 2021-11-25 DIAGNOSIS — E1122 Type 2 diabetes mellitus with diabetic chronic kidney disease: Secondary | ICD-10-CM | POA: Insufficient documentation

## 2021-11-25 NOTE — Progress Notes (Signed)
[name] HSER, BELANGER ([id number]) Visit Report for 11/25/2021 Allergy List Details Patient Name: Date of Service: [name] Barnett, CHICK 11/25/2021 1:30 PM Medical Record Number: [id number] Patient Account Number: [id number] Date of Birth/Sex: Treating RN: 06-06-1980 (41 y.o. Harlow Ohms Primary Care Malique Driskill: SITA FA LWA LLA, SHA RMIN Other Clinician: Referring Joyce Heitman: Treating Clell Trahan/Extender: Fredirick Maudlin SITA FA LWA LLA, SHA RMIN Weeks in Treatment: 0 Allergies Active Allergies lisinopril Reaction: hair falls out, lip swelling Severity: Moderate morphine Reaction: hallucinations corn Reaction: throat swelling Allergy Notes Electronic Signature(s) Signed: 11/25/2021 4:59:32 PM By: Adline Peals Entered By: Adline Peals on 11/25/2021 13:57:00 -------------------------------------------------------------------------------- Arrival Information Details Patient Name: Date of Service: [name] [name] Barnett, [place] 11/25/2021 1:30 PM Medical Record Number: [id number] Patient Account Number: [id number] Date of Birth/Sex: Treating RN: 23-Sep-1980 (41 y.o. Harlow Ohms Primary Care Gaia Gullikson: SITA FA LWA LLA, SHA RMIN Other Clinician: Referring Zailen Albarran: Treating Koreen Lizaola/Extender: Fredirick Maudlin SITA FA LWA LLA, SHA RMIN Weeks in Treatment: 0 Visit Information Patient Arrived: Wheel Chair Arrival Time: 13:48 Accompanied By: self Transfer Assistance: None Patient Identification Verified: Yes Secondary Verification Process Completed: Yes History Since Last Visit Added or deleted any medications: No Any new allergies or adverse reactions: No Had a fall or experienced change in activities of daily living that may affect risk of falls: No Signs or symptoms of abuse/neglect since last visito No Hospitalized since last visit: No Implantable device outside of the clinic excluding cellular tissue based products placed in the center since last visit:  No Electronic Signature(s) Signed: 11/25/2021 4:59:32 PM By: Adline Peals Entered By: Adline Peals on 11/25/2021 13:55:21 -------------------------------------------------------------------------------- Clinic Level of Care Assessment Details Patient Name: Date of Service: [name] [name], Barnett 11/25/2021 1:30 PM Medical Record Number: [id number] Patient Account Number: [id number] Date of Birth/Sex: Treating RN: 1980/06/16 (41 y.o. Harlow Ohms Primary Care Aliece Honold: SITA FA LWA LLA, SHA RMIN Other Clinician: Referring Ellenie Salome: Treating Miaya Lafontant/Extender: Fredirick Maudlin SITA FA LWA LLA, SHA RMIN Weeks in Treatment: 0 Clinic Level of Care Assessment Items TOOL 1 Quantity Score X- 1 0 Use when EandM and Procedure is performed on INITIAL visit ASSESSMENTS - Nursing Assessment / Reassessment X- 1 20 General Physical Exam (combine w/ comprehensive assessment (listed just below) when performed on new pt. evals) X- 1 25 Comprehensive Assessment (HX, ROS, Risk Assessments, Wounds Hx, etc.) ASSESSMENTS - Wound and Skin Assessment / Reassessment []  - 0 Dermatologic / Skin Assessment (not related to wound area) ASSESSMENTS - Ostomy and/or Continence Assessment and Care []  - 0 Incontinence Assessment and Management []  - 0 Ostomy Care Assessment and Management (repouching, etc.) PROCESS - Coordination of Care X - Simple Patient / Family Education for ongoing care 1 15 []  - 0 Complex (extensive) Patient / Family Education for ongoing care X- 1 10 Staff obtains Programmer, systems, Records, T Results / Process Orders est []  - 0 Staff telephones HHA, Nursing Homes / Clarify orders / etc []  - 0 Routine Transfer to another Facility (non-emergent condition) []  - 0 Routine Hospital Admission (non-emergent condition) X- 1 15 New Admissions / Biomedical engineer / Ordering NPWT Apligraf, etc. , []  - 0 Emergency Hospital Admission (emergent condition) PROCESS - Special  Needs []  - 0 Pediatric / Minor Patient Management []  - 0 Isolation Patient Management []  - 0 Hearing / Language / Visual special needs []  - 0 Assessment of Community assistance (transportation, D/C planning, etc.) []  - 0 Additional assistance / Altered mentation []  - 0 Support Surface(s) Assessment (bed, cushion, seat, etc.)  INTERVENTIONS - Miscellaneous []  - 0 External ear exam []  - 0 Patient Transfer (multiple staff / Civil Service fast streamer / Similar devices) []  - 0 Simple Staple / Suture removal (25 or less) []  - 0 Complex Staple / Suture removal (26 or more) []  - 0 Hypo/Hyperglycemic Management (do not check if billed separately) []  - 0 Ankle / Brachial Index (ABI) - do not check if billed separately Has the patient been seen at the hospital within the last three years: Yes Total Score: 85 Level Of Care: New/Established - Level 3 Electronic Signature(s) Signed: 11/25/2021 4:59:32 PM By: Adline Peals Entered By: Adline Peals on 11/25/2021 16:32:14 -------------------------------------------------------------------------------- Encounter Discharge Information Details Patient Name: Date of Service: [name] [name] Barnett, Lafe 11/25/2021 1:30 PM Medical Record Number: [id number] Patient Account Number: [id number] Date of Birth/Sex: Treating RN: [birth year] (41 y.o. Harlow Ohms Primary Care Tyquez Hollibaugh: SITA FA LWA LLA, SHA RMIN Other Clinician: Referring Keishla Oyer: Treating Shanan Fitzpatrick/Extender: Fredirick Maudlin SITA FA LWA LLA, SHA RMIN Weeks in Treatment: 0 Encounter Discharge Information Items Post Procedure Vitals Discharge Condition: Stable Temperature (F): 99.3 Ambulatory Status: Wheelchair Pulse (bpm): 78 Discharge Destination: Home Respiratory Rate (breaths/min): 16 Transportation: Private Auto Blood Pressure (mmHg): 122/79 Accompanied By: self Schedule Follow-up Appointment: Yes Clinical Summary of Care: Patient Declined Electronic Signature(s) Signed:  11/25/2021 4:59:32 PM By: Adline Peals Entered By: Adline Peals on 11/25/2021 16:16:17 -------------------------------------------------------------------------------- Lower Extremity Assessment Details Patient Name: Date of Service: [name] [name] KIRA, HARTL 11/25/2021 1:30 PM Medical Record Number: [id number] Patient Account Number: [id number] Date of Birth/Sex: Treating RN: 03-04-1981 (41 y.o. Harlow Ohms Primary Care Bronwyn Belasco: SITA FA LWA LLA, SHA RMIN Other Clinician: Referring Sedric Guia: Treating Kirbi Farrugia/Extender: Fredirick Maudlin SITA FA LWA LLA, SHA RMIN Weeks in Treatment: 0 Electronic Signature(s) Signed: 11/25/2021 4:59:32 PM By: Adline Peals Entered By: Adline Peals on 11/25/2021 13:58:50 -------------------------------------------------------------------------------- Multi Wound Chart Details Patient Name: Date of Service: [name] [name] ANAGHA, Barnett 11/25/2021 1:30 PM Medical Record Number: [id number] Patient Account Number: [id number] Date of Birth/Sex: Treating RN: February 17, 1981 (41 y.o. Harlow Ohms Primary Care Shan Valdes: SITA FA LWA LLA, SHA RMIN Other Clinician: Referring Bari Handshoe: Treating Akshith Moncus/Extender: Fredirick Maudlin SITA FA LWA LLA, SHA RMIN Weeks in Treatment: 0 Vital Signs Height(in): 65 Capillary Blood Glucose(mg/dl): 199 Weight(lbs): 170 Pulse(bpm): 78 Body Mass Index(BMI): 28.3 Blood Pressure(mmHg): 122/79 Temperature(F): 99..3 Respiratory Rate(breaths/min): 16 Photos: [8:No Photos Sacrum] [N/A:N/A N/A] Wound Location: [8:Pressure Injury] [N/A:N/A] Wounding Event: [8:Pressure Ulcer] [N/A:N/A] Primary Etiology: [8:Cataracts, Glaucoma, Anemia,] [N/A:N/A] Comorbid History: [8:Congestive Heart Failure, Hypertension, Type II Diabetes, End Stage Renal Disease, Neuropathy, Confinement Anxiety 07/15/2021] [N/A:N/A] Date Acquired: [8:19] [N/A:N/A] Weeks of Treatment: [8:Open] [N/A:N/A] Wound Status: [8:No]  [N/A:N/A] Wound Recurrence: [8:0.4x0.2x0.1] [N/A:N/A] Measurements L x W x D (cm) [8:0.063] [N/A:N/A] A (cm) : rea [8:0.006] [N/A:N/A] Volume (cm) : [8:85.10%] [N/A:N/A] % Reduction in A [8:rea: 92.90%] [N/A:N/A] % Reduction in Volume: [8:Category/Stage II] [N/A:N/A] Classification: [8:Medium] [N/A:N/A] Exudate A mount: [8:Serosanguineous] [N/A:N/A] Exudate Type: [8:red, brown] [N/A:N/A] Exudate Color: [8:Distinct, outline attached] [N/A:N/A] Wound Margin: [8:Large (67-100%)] [N/A:N/A] Granulation A mount: [8:Pink] [N/A:N/A] Granulation Quality: [8:Small (1-33%)] [N/A:N/A] Necrotic A mount: [8:Fat Layer (Subcutaneous Tissue): Yes N/A] Exposed Structures: [8:Fascia: No Tendon: No Muscle: No Joint: No Bone: No Large (67-100%)] [N/A:N/A] Epithelialization: [8:Debridement - Excisional] [N/A:N/A] Debridement: Pre-procedure Verification/Time Out 14:07 [N/A:N/A] Taken: [8:Lidocaine 4% Topical Solution] [N/A:N/A] Pain Control: [8:Subcutaneous] [N/A:N/A] Tissue Debrided: [8:Skin/Subcutaneous Tissue] [N/A:N/A] Level: [8:0.08] [N/A:N/A] Debridement A (sq cm): [8:rea Curette] [N/A:N/A] Instrument: [8:Minimum] [N/A:N/A] Bleeding: [8:Pressure] [N/A:N/A] Hemostasis A chieved: [8:0] [N/A:N/A] Procedural  Pain: [8:0] [N/A:N/A] Post Procedural Pain: [8:Procedure was tolerated well] [N/A:N/A] Debridement Treatment Response: [8:0.4x0.2x0.1] [N/A:N/A] Post Debridement Measurements L x W x D (cm) [8:0.006] [N/A:N/A] Post Debridement Volume: (cm) [8:Category/Stage II] [N/A:N/A] Post Debridement Stage: [8:Debridement] [N/A:N/A] Treatment Notes Electronic Signature(s) Signed: 11/25/2021 2:23:42 PM By: Fredirick Maudlin MD FACS Signed: 11/25/2021 4:59:32 PM By: Adline Peals Entered By: Fredirick Maudlin on 11/25/2021 14:23:42 -------------------------------------------------------------------------------- [place] Details Patient Name: Date of Service: [name] [name] Barnett,  [place] 11/25/2021 1:30 PM Medical Record Number: [id number] Patient Account Number: [id number] Date of Birth/Sex: Treating RN: 1981-03-20 (41 y.o. Harlow Ohms Primary Care Taneeka Curtner: SITA FA LWA LLA, SHA RMIN Other Clinician: Referring Kaitlan Bin: Treating Zaiden Ludlum/Extender: Fredirick Maudlin SITA FA LWA LLA, SHA RMIN Weeks in Treatment: 0 Active Inactive Wound/Skin Impairment Nursing Diagnoses: Impaired tissue integrity Knowledge deficit related to ulceration/compromised skin integrity Goals: Patient/caregiver will verbalize understanding of skin care regimen Date Initiated: 11/25/2021 Target Resolution Date: 01/08/2022 Goal Status: Active Ulcer/skin breakdown will have a volume reduction of 30% by week 4 Date Initiated: 11/25/2021 Target Resolution Date: 01/08/2022 Goal Status: Active Interventions: Assess patient/caregiver ability to obtain necessary supplies Assess ulceration(s) every visit Treatment Activities: Skin care regimen initiated : 11/25/2021 Topical wound management initiated : 11/25/2021 Notes: Electronic Signature(s) Signed: 11/25/2021 4:59:32 PM By: Adline Peals Entered By: Adline Peals on 11/25/2021 16:31:39 -------------------------------------------------------------------------------- Pain Assessment Details Patient Name: Date of Service: [name] [name] Barnett, YANKEY 11/25/2021 1:30 PM Medical Record Number: [id number] Patient Account Number: [id number] Date of Birth/Sex: Treating RN: 06-26-1980 (41 y.o. Harlow Ohms Primary Care Hykeem Ojeda: SITA FA LWA LLA, SHA RMIN Other Clinician: Referring Estiven Kohan: Treating Sharissa Brierley/Extender: Fredirick Maudlin SITA FA LWA LLA, SHA RMIN Weeks in Treatment: 0 Active Problems Location of Pain Severity and Description of Pain Patient Has Paino No Site Locations Rate the pain. Current Pain Level: 0 Pain Management and Medication Current Pain Management: Electronic Signature(s) Signed: 11/25/2021  4:59:32 PM By: Adline Peals Entered By: Adline Peals on 11/25/2021 14:03:42 -------------------------------------------------------------------------------- Patient/Caregiver Education Details Patient Name: Date of Service: [name] [name] Barnett, WINGLER 7/26/2023andnbsp1:30 PM Medical Record Number: [id number] Patient Account Number: [id number] Date of Birth/Gender: Treating RN: 1980-05-27 (41 y.o. Harlow Ohms Primary Care Physician: SITA FA LWA LLA, SHA RMIN Other Clinician: Referring Physician: Treating Physician/Extender: Fredirick Maudlin SITA FA LWA LLA, SHA RMIN Weeks in Treatment: 0 Education Assessment Education Provided To: Patient Education Topics Provided Wound/Skin Impairment: Methods: Explain/Verbal Responses: Reinforcements needed, State content correctly Electronic Signature(s) Signed: 11/25/2021 4:59:32 PM By: Adline Peals Entered By: Adline Peals on 11/25/2021 16:31:47 -------------------------------------------------------------------------------- Wound Assessment Details Patient Name: Date of Service: [name] [name] [name], Barnett 11/25/2021 1:30 PM Medical Record Number: [id number] Patient Account Number: [id number] Date of Birth/Sex: Treating RN: December 07, 1980 (41 y.o. Harlow Ohms Primary Care Tuff Clabo: SITA FA LWA LLA, SHA RMIN Other Clinician: Referring Snyder Colavito: Treating Yidel Teuscher/Extender: Fredirick Maudlin SITA FA LWA LLA, SHA RMIN Weeks in Treatment: 0 Wound Status Wound Number: 8 Primary Pressure Ulcer Etiology: Wound Location: Sacrum Wound Open Wounding Event: Pressure Injury Status: Date Acquired: 07/15/2021 Comorbid Cataracts, Glaucoma, Anemia, Congestive Heart Failure, Weeks Of Treatment: 19 History: Hypertension, Type II Diabetes, End Stage Renal Disease, Clustered Wound: No Neuropathy, Confinement Anxiety Wound Measurements Length: (cm) 0.4 Width: (cm) 0.2 Depth: (cm) 0.1 Area: (cm) 0.063 Volume: (cm)  0.006 Wound Description Classification: Category/Stage II Wound Margin: Distinct, outline attached Exudate Amount: Medium Exudate Type: Serosanguineous Exudate Color: red, brown Foul Odor After Cleansing: Slough/Fibrino % Reduction in Area: 85.1% % Reduction in Volume: 92.9% Epithelialization: Large (  67-100%) Tunneling: No Undermining: No No Yes Wound Bed Granulation Amount: Large (67-100%) Exposed Structure Granulation Quality: Pink Fascia Exposed: No Necrotic Amount: Small (1-33%) Fat Layer (Subcutaneous Tissue) Exposed: Yes Necrotic Quality: Adherent Slough Tendon Exposed: No Muscle Exposed: No Joint Exposed: No Bone Exposed: No Treatment Notes Wound #8 (Sacrum) Cleanser Wound Cleanser Discharge Instruction: Cleanse the wound with wound cleanser prior to applying a clean dressing using gauze sponges, not tissue or cotton balls. Peri-Wound Care Skin Prep Discharge Instruction: Use skin prep as directed Topical Primary Dressing KerraCel Ag Gelling Fiber Dressing, 2x2 in (silver alginate) Discharge Instruction: Apply silver alginate to wound bed as instructed Secondary Dressing ALLEVYN Gentle Border, 3x3 (in/in) Discharge Instruction: Apply over primary dressing as directed. Secured With Compression Wrap Compression Stockings Environmental education officer) Signed: 11/25/2021 4:59:32 PM By: Adline Peals Entered By: Adline Peals on 11/25/2021 14:03:35 -------------------------------------------------------------------------------- Vitals Details Patient Name: Date of Service: [name] [name] Barnett, [place] 11/25/2021 1:30 PM Medical Record Number: [id number] Patient Account Number: [id number] Date of Birth/Sex: Treating RN: 12-May-1980 (41 y.o. Harlow Ohms Primary Care Irem Stoneham: SITA FA LWA LLA, SHA RMIN Other Clinician: Referring Nerissa Constantin: Treating Giah Fickett/Extender: Fredirick Maudlin SITA FA LWA LLA, SHA RMIN Weeks in Treatment: 0 Vital  Signs Time Taken: 13:55 Temperature (F): 99.3 Height (in): 65 Pulse (bpm): 78 Source: Stated Respiratory Rate (breaths/min): 16 Weight (lbs): 170 Blood Pressure (mmHg): 122/79 Source: Stated Capillary Blood Glucose (mg/dl): 199 Body Mass Index (BMI): 28.3 Reference Range: 80 - 120 mg / dl Electronic Signature(s) Signed: 11/25/2021 4:59:32 PM By: Adline Peals Entered By: Adline Peals on 11/25/2021 16:15:35

## 2021-11-25 NOTE — Progress Notes (Signed)
[name] Barnett, [name] Barnett ([id number]) Visit Report for 11/25/2021 Abuse Risk Screen Details Patient Name: Date of Service: [name] Barnett, [name] Barnett 11/25/2021 1:30 PM Medical Record Number: [id number] Patient Account Number: [id number] Date of Birth/Sex: Treating RN: Oct 10, 1980 (41 y.o. [name] Barnett Primary Care Gerline Ratto: SITA FA LWA LLA, SHA RMIN Other Clinician: Referring Vinnie Bobst: Treating Tonesha Tsou/Extender: Fredirick Maudlin SITA FA LWA LLA, SHA RMIN Weeks in Treatment: 0 Abuse Risk Screen Items Answer ABUSE RISK SCREEN: Has anyone close to you tried to hurt or harm you recentlyo No Do you feel uncomfortable with anyone in your familyo No Has anyone forced you do things that you didnt want to doo No Electronic Signature(s) Signed: 11/25/2021 4:59:32 PM By: Adline Peals Entered By: Adline Peals on 11/25/2021 13:57:14 -------------------------------------------------------------------------------- Activities of Daily Living Details Patient Name: Date of Service: [name] Barnett, [name] Barnett 11/25/2021 1:30 PM Medical Record Number: [id number] Patient Account Number: [id number] Date of Birth/Sex: Treating RN: 1980/08/13 (41 y.o. [name] Barnett Primary Care Quianna Avery: SITA FA LWA LLA, SHA RMIN Other Clinician: Referring Nasiah Polinsky: Treating Hilario Robarts/Extender: Fredirick Maudlin SITA FA LWA LLA, SHA RMIN Weeks in Treatment: 0 Activities of Daily Living Items Answer Activities of Daily Living (Please select one for each item) Drive Automobile Not Able T Medications ake Completely Able Use T elephone Completely Able Care for Appearance Completely Able Use T oilet Completely Able Bath / Shower Completely Able Dress Self Completely Able Feed Self Completely Able Walk Need Assistance Get In / Out Bed Completely Able Housework Need Assistance Prepare Meals Need Assistance Handle Money Need Assistance Shop for Self Need Assistance Electronic Signature(s) Signed:  11/25/2021 4:59:32 PM By: Adline Peals Entered By: Adline Peals on 11/25/2021 13:57:42 -------------------------------------------------------------------------------- Education Screening Details Patient Name: Date of Service: [name] Barnett, [name] Barnett 11/25/2021 1:30 PM Medical Record Number: [id number] Patient Account Number: [id number] Date of Birth/Sex: Treating RN: 1980-12-03 (40 y.o. [name] Barnett Primary Care Carsten Carstarphen: SITA FA LWA LLA, SHA RMIN Other Clinician: Referring Graceann Boileau: Treating Adriana Lina/Extender: Fredirick Maudlin SITA FA LWA LLA, SHA RMIN Weeks in Treatment: 0 Primary Learner Assessed: Patient Learning Preferences/Education Level/Primary Language Learning Preference: Explanation, Demonstration, Video, Printed Material Highest Education Level: High School Preferred Language: English Cognitive Barrier Language Barrier: No Translator Needed: No Memory Deficit: No Emotional Barrier: No Cultural/Religious Beliefs Affecting Medical Care: No Physical Barrier Impaired Vision: No Impaired Hearing: No Decreased Hand dexterity: No Knowledge/Comprehension Knowledge Level: Medium Comprehension Level: Medium Ability to understand written instructions: Medium Ability to understand verbal instructions: Medium Motivation Anxiety Level: Calm Cooperation: Cooperative Education Importance: Acknowledges Need Interest in Health Problems: Asks Questions Perception: Coherent Willingness to Engage in Self-Management Medium Activities: Readiness to Engage in Self-Management Medium Activities: Electronic Signature(s) Signed: 11/25/2021 4:59:32 PM By: Adline Peals Entered By: Adline Peals on 11/25/2021 13:58:04 -------------------------------------------------------------------------------- Fall Risk Assessment Details Patient Name: Date of Service: [name] Barnett, [name] Barnett 11/25/2021 1:30 PM Medical Record Number: [id number] Patient Account Number:  [id number] Date of Birth/Sex: Treating RN: August 04, 1980 (41 y.o. [name] Barnett Primary Care Jon Kasparek: SITA FA LWA LLA, SHA RMIN Other Clinician: Referring Nansi Birmingham: Treating Richey Doolittle/Extender: Fredirick Maudlin SITA FA LWA LLA, SHA RMIN Weeks in Treatment: 0 Fall Risk Assessment Items Have you had 2 or more falls in the last 12 monthso 0 Yes Have you had any fall that resulted in injury in the last 12 monthso 0 No FALLS RISK SCREEN History of falling - immediate or within 3 months 0 No Secondary diagnosis (Do you have 2 or more medical diagnoseso) 15 Yes Ambulatory aid  None/bed rest/wheelchair/nurse 0 Yes Crutches/cane/walker 15 Yes Furniture 0 No Intravenous therapy Access/Saline/Heparin Lock 0 No Gait/Transferring Normal/ bed rest/ wheelchair 0 No Weak (short steps with or without shuffle, stooped but able to lift head while walking, may seek 0 No support from furniture) Impaired (short steps with shuffle, may have difficulty arising from chair, head down, impaired 0 No balance) Mental Status Oriented to own ability 0 Yes Electronic Signature(s) Signed: 11/25/2021 4:59:32 PM By: Adline Peals Entered By: Adline Peals on 11/25/2021 13:58:28 -------------------------------------------------------------------------------- Foot Assessment Details Patient Name: Date of Service: [name] Barnett, [name] Barnett 11/25/2021 1:30 PM Medical Record Number: [id number] Patient Account Number: [id number] Date of Birth/Sex: Treating RN: 1980-12-15 (42 y.o. [name] Barnett Primary Care Shana Younge: SITA FA LWA LLA, SHA RMIN Other Clinician: Referring Janequa Kipnis: Treating Rashauna Tep/Extender: Fredirick Maudlin SITA FA LWA LLA, SHA RMIN Weeks in Treatment: 0 Foot Assessment Items Site Locations + = Sensation present, - = Sensation absent, C = Callus, U = Ulcer R = Redness, W = Warmth, M = Maceration, PU = Pre-ulcerative lesion F = Fissure, S = Swelling, D =  Dryness Assessment Right: Left: Other Deformity: No No Prior Foot Ulcer: No No Prior Amputation: No No Charcot Joint: No No Ambulatory Status: Gait: Electronic Signature(s) Signed: 11/25/2021 4:59:32 PM By: Adline Peals Entered By: Adline Peals on 11/25/2021 13:58:47 -------------------------------------------------------------------------------- Nutrition Risk Screening Details Patient Name: Date of Service: [name] Barnett, [name] Barnett 11/25/2021 1:30 PM Medical Record Number: [id number] Patient Account Number: [id number] Date of Birth/Sex: Treating RN: November 22, 1980 (41 y.o. [name] Barnett Primary Care Afia Messenger: SITA FA LWA LLA, SHA RMIN Other Clinician: Referring Cortnie Ringel: Treating Jerryl Holzhauer/Extender: Fredirick Maudlin SITA FA LWA LLA, SHA RMIN Weeks in Treatment: 0 Height (in): 65 Weight (lbs): 170 Body Mass Index (BMI): 28.3 Nutrition Risk Screening Items Score Screening NUTRITION RISK SCREEN: I have an illness or condition that made me change the kind and/or amount of food I eat 0 No I eat fewer than two meals per day 0 No I eat few fruits and vegetables, or milk products 0 No I have three or more drinks of beer, liquor or wine almost every day 0 No I have tooth or mouth problems that make it hard for me to eat 0 No I don't always have enough money to buy the food I need 0 No I eat alone most of the time 0 No I take three or more different prescribed or over-the-counter drugs a day 1 Yes Without wanting to, I have lost or gained 10 pounds in the last six months 0 No I am not always physically able to shop, cook and/or feed myself 0 No Nutrition Protocols Good Risk Protocol 0 No interventions needed Moderate Risk Protocol High Risk Proctocol Risk Level: Good Risk Score: 1 Electronic Signature(s) Signed: 11/25/2021 4:59:32 PM By: Adline Peals Entered By: Adline Peals on 11/25/2021 13:58:43

## 2021-11-26 NOTE — Progress Notes (Signed)
[name] Barnett, STAUBS ([id number]) Visit Report for 11/25/2021 Chief Complaint Document Details Patient Name: Date of Service: [name] Barnett, [name] Barnett 11/25/2021 1:30 PM Medical Record Number: [id number] Patient Account Number: [id number] Date of Birth/Sex: Treating RN: 12/28/1980 (41 y.o. Harlow Ohms Primary Care Provider: SITA FA LWA LLA, SHA RMIN Other Clinician: Referring Provider: Treating Provider/Extender: Fredirick Maudlin SITA FA LWA LLA, SHA RMIN Weeks in Treatment: 0 Information Obtained from: Patient Chief Complaint 06/15/2019; patient is here for review of the wound on her lower sacrum 12/31/2020; patient is here for review of the wound on her right buttock 11/25/2021: Patient is here for evaluation and management of a stage II sacral pressure ulcer. Electronic Signature(s) Signed: 11/25/2021 2:24:06 PM By: Fredirick Maudlin MD FACS Entered By: Fredirick Maudlin on 11/25/2021 14:24:06 -------------------------------------------------------------------------------- Debridement Details Patient Name: Date of Service: [name] Barnett, [name] 11/25/2021 1:30 PM Medical Record Number: [id number] Patient Account Number: [id number] Date of Birth/Sex: Treating RN: [birth year] (41 y.o. Harlow Ohms Primary Care Provider: SITA FA LWA LLA, SHA RMIN Other Clinician: Referring Provider: Treating Provider/Extender: Fredirick Maudlin SITA FA LWA LLA, SHA RMIN Weeks in Treatment: 0 Debridement Performed for Assessment: Wound #8 Sacrum Performed By: Physician Fredirick Maudlin, MD Debridement Type: Debridement Level of Consciousness (Pre-procedure): Awake and Alert Pre-procedure Verification/Time Out Yes - 14:07 Taken: Start Time: 14:07 Pain Control: Lidocaine 4% T opical Solution T Area Debrided (L x W): otal 0.4 (cm) x 0.2 (cm) = 0.08 (cm) Tissue and other material debrided: Non-Viable, Subcutaneous, Skin: Epidermis Level: Skin/Subcutaneous Tissue Debridement Description:  Excisional Instrument: Curette Bleeding: Minimum Hemostasis Achieved: Pressure Procedural Pain: 0 Post Procedural Pain: 0 Response to Treatment: Procedure was tolerated well Level of Consciousness (Post- Awake and Alert procedure): Post Debridement Measurements of Total Wound Length: (cm) 0.4 Stage: Category/Stage II Width: (cm) 0.2 Depth: (cm) 0.1 Volume: (cm) 0.006 Character of Wound/Ulcer Post Debridement: Improved Post Procedure Diagnosis Same as Pre-procedure Electronic Signature(s) Signed: 11/25/2021 2:58:48 PM By: Fredirick Maudlin MD FACS Signed: 11/25/2021 4:59:32 PM By: Adline Peals Entered By: Adline Peals on 11/25/2021 14:08:31 -------------------------------------------------------------------------------- HPI Details Patient Name: Date of Service: [name] Barnett, [name] Barnett 11/25/2021 1:30 PM Medical Record Number: [id number] Patient Account Number: [id number] Date of Birth/Sex: Treating RN: [birth year] (41 y.o. Harlow Ohms Primary Care Provider: SITA FA LWA LLA, SHA RMIN Other Clinician: Referring Provider: Treating Provider/Extender: Fredirick Maudlin SITA FA LWA LLA, SHA RMIN Weeks in Treatment: 0 History of Present Illness HPI Description: Admission 06/15/2019 This is a 41 year old woman who comes to [place] from [facility] skilled facility in [place]. I am able to follow a lot of her history on care everywhere in epic. The patient is a type II diabetic and has been on dialysis. 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 I am not exactly sure of the nature of the stroke at this present time. She has made a decent recovery and is now up walking with a walker. In August 2020 she was admitted to [facility] in [place] with L4-L5 discitis. Culture and sensitivity showed Klebsiella. She was discharged on 8 weeks of ceftazidime. In mid September she is readmitted to  the hospital with a pressure ulcer on her lower sacrum and necrotizing fasciitis. She required an extensive surgical debridement. CT scan of the pelvis at 1 point showed communication with the rectum. A colostomy was recommended and placed. Culture of this area showed Klebsiella and yeast. She was discharged on  an extended course of meropenem and Diflucan. [facility] [place] We do not have any of these records. I think she was discharged to another nursing home but is come to [facility] skilled facility in [facility]. She was sent down here for review of the remaining wound. By review of the pictures that the patient had from September this is made considerable improvement. She tells me that the wound VAC was discontinued at the end of December 2020. Currently the facility is using a form of silver alginate to the wound surface. Past medical history type 2 diabetes with chronic renal failure on dialysis Tuesday Thursday and Saturday. CVA with left hemiparesis in June 2020 but she appears to be making a good recovery. Chronic diastolic heart failure, hypertension 2/19; patient's wound area slightly smaller. The area that was towards her anus has closed over. We are using Santyl covered with Hydrofera Blue. We have not been able to get her lab work apparently her veins are very difficult for phlebotomy. They are trying to get the blood work done where she dialyzes in [facility] but that requires some logistical issues. 2/26; not much change in the wound. Still a nonviable surface we have been using Santyl and Hydrofera Blue. I changed her to [facility] only. She tells me she has rigorously offloading this area she also complains of some pain 3/5; still no change here. I switch to pure Hydrofera Blue. She still complains of a lot of pain although is a big wound it seems out of proportion to what I might expect. This was initially a  surgical wound secondary to necrotizing wound infection. She is on dialysis. Other than that she thinks she is offloading this fairly rigorously. She has not been systemically unwell 3/26; I switched to Iodoflex 3 weeks ago. This really seems to have helped, much better surface and slight improvement in the surface area. 4/26; the patient is still on Iodoflex she missed an appointment 2 weeks ago. Doing slightly smaller. She is on dialysis 5/10; using Hydrofera Blue. The major improvement here is the complete lack of depth. She has islands of epithelialization 09/24/19-Patient comes in at 2 weeks, dimensions about the same, there is an [place] of epithelium at margin of wound, no slough noted 10/15/2019 upon evaluation today patient appears to be doing well with regard to her wound. She has been tolerating the dressing changes in the sacral region without complication and at this point she seems to be healing quite nicely. I am extremely pleased with how things are progressing and the epithelial tissue seems to be spreading from the central portion as well which is also excellent. 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 We have been using Hydrofera Blue. The smaller area which has depth at 0.7 cm there is a question about whether they have been packing anything into this or just flaring Hydrofera Blue over the top 9/23; its been more than 2 months since this patient was here. She still has a small area distally which is the remanent of her stage IV wound. They have been using Hydrofera Blue. She also has an area on the left gluteal 10/22; 1 month follow-up. I think the original sacral wound is closed however the patient has 2 areas distally 1 over the coccyx a small pinpoint area and the other into the gluteal cleft. Neither 1 of these looks particularly  infected. I thought they might be connected but they were not. 11/15; the original  sacral wound is closed patient only has 1 remaining area. 1 of these is in the gluteal cleft just below the coccyx. The area over the coccyx from last time I think is all so closed 12/31/2020; this is a patient that we saw for a pressure ulcer over her lower sacrum and coccyx for a long period of time in 2021. She eventually healed over. She is at [facility] skilled facility largely secondary to a remote CVA she had. She is also end-stage renal disease on dialysis secondary to type 2 diabetes. She tells [place] that since about mid June she has had an area more distally than the original sacrum/coccyx wound it looks as though they are putting silver alginate in this area. She is limited in her ability to offload this I think spending most of her time in a wheelchair although she can stand to transfer. She has an ostomy During her original stay here in 2021 I also list that she had a more distal area into her perineum that closed over well before the more proximal coccyx/sacral wound. She had a CT scan of the abdomen and pelvis on 12/01/2020. This did note first chronic sacral decubitus along the right aspect of her distal sacrum and coccyx. There was not felt to be bone destruction she has chronic changes of bilateral sacroiliitis and chronic endplate destruction and sclerosis at L4-L5 secondary to previous osteomyelitis however no new findings were felt to be seen in terms of bone problems in this area 01/14/2021; 2-week follow-up. Her wound is smaller essentially in the coccyx area. We have been using silver collagen. 10/5; 3-week follow-up. The patient returns with her area in the coccyx healed however distally in the perineum there is a fairly long stage II area. I am assuming this patient's wound is a pressure related area. Although this would not be a usual pressure area for a normal situation her immobility secondary to remote CVA, dialysis etc. make her at risk for pressure areas in an otherwise  unusual setting. The patient is fairly upset stating that we were told about this last time. I do not have this in my records. Her original wound was small and just about healed at that point and this visit was arranged in follow-up. If there was a new wound more distally into her perineum we did not look at it 11/2; patient's wound is slightly smaller. This is in the gluteal cleft on the left. Using silver alginate. She tells me she is aggressively offloading this area. 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 We have been using silver alginate. Arrives in clinic today with the wound larger. 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 We have been using silver alginate. PCR culture I did last week was polymicrobial with multiple organisms we put Polysporin under the silver alginate at the facility. According to the patient this got started 05/13/2021; the wound looks slightly smaller. This is in the gluteal cleft on the left. She is using silver alginate with topical antibiotics underneath. The topical antibiotics relate to a deep tissue culture I did some weeks ago. This showed a lawn of low titer bacteria.  I am not sure how much she is offloading this area both at dialysis and during the day at the facility would be the other issue 2/8; continued nice improvement the wound is smaller. This is on the gluteal cleft on the left. She is using topical antibiotics/triple antibiotic with silver alginate. 07/08/2021: The wound in her perineum continues to improve. Daily dressing changes with triple antibiotic and silver alginate are being employed. There is a small amount of slough at the base. A new problem today is that somehow she suffered a traumatic  laceration to her left fourth toe. It is bleeding briskly in clinic. She is insensate in that area and has no idea how the injury occurred. 07/14/2021: The perineal wound looks a bit better, but unfortunately, the sacral wound that was just cranial to this has reopened. It is stage II with minimal debris and slough. The traumatic laceration to her left fourth toe has a gray thick slough. She denies any fevers, chills, or other concerns for systemic infection. 07/24/2021: Both the perineal and sacral wounds appear roughly the same today with minimal slough. The traumatic laceration to her left fourth toe looks a bit worse with necrosis of muscle and exposed tendon. No fevers or chills or any concern for systemic infection. 07/31/2021: Both perineal and sacral wounds are perhaps a little bit smaller and are very clean today. The traumatic laceration to her left fourth toe is markedly improved. No necrotic tissue was appreciated. She apparently had her x-ray performed at [facility] but it was not forwarded to [place] for review of the results. It appears that her infectious disease doctor has also referred her to podiatry because of the toe. 08/14/2021: The perineal wound is about the same size, but the sacral wound has contracted quite substantially. The traumatic laceration to her left fourth toe has made the most dramatic improvement. Minimal slough and the wound is closing up. We have been using Santyl and Hydrofera Blue on the toe and silver alginate to both the sacral and perineal wounds. 08/26/2021: The wound on her toe is nearly closed. The perineal wound is stable and the sacral wound is smaller. No concern for infection. READMISSION 11/25/2021 After our last visit, the patient left her skilled nursing facility. She has not been seen or received any significant wound care since then. Her toe is healed and her perineal wound has also epithelialized. She just has a small stage II sacral ulcer. The surface  has slough accumulation and there is some heaped up senescent skin around the borders. Electronic Signature(s) Signed: 11/25/2021 2:31:23 PM By: Fredirick Maudlin MD FACS Entered By: Fredirick Maudlin on 11/25/2021 14:31:23 -------------------------------------------------------------------------------- Physical Exam Details Patient Name: Date of Service: [name] Barnett, GRUMBINE 11/25/2021 1:30 PM Medical Record Number: [id number] Patient Account Number: [id number] Date of Birth/Sex: Treating RN: [birth year] (41 y.o. Harlow Ohms Primary Care Provider: SITA FA LWA LLA, SHA RMIN Other Clinician: Referring Provider: Treating Provider/Extender: Fredirick Maudlin SITA FA LWA LLA, SHA RMIN Weeks in Treatment: 0 Constitutional . . . . No acute distress. Notes 11/25/2021: She just has a small stage II sacral ulcer. The surface has slough accumulation and there is some heaped up senescent skin around the borders. Electronic Signature(s) Signed: 11/25/2021 2:34:51 PM By: Fredirick Maudlin MD FACS Entered By: Fredirick Maudlin on 11/25/2021 14:34:51 -------------------------------------------------------------------------------- Physician Orders Details Patient Name: Date of Service: [name] Barnett, COGAN 11/25/2021 1:30 PM Medical Record Number: [id number] Patient Account Number: [id number] Date of Birth/Sex: Treating RN: 1981/02/05 (  41 y.o. Harlow Ohms Primary Care Provider: SITA FA LWA LLA, SHA RMIN Other Clinician: Referring Provider: Treating Provider/Extender: Fredirick Maudlin SITA FA LWA LLA, SHA RMIN Weeks in Treatment: 0 Verbal / Phone Orders: No Diagnosis Coding ICD-10 Coding Code Description L89.152 Pressure ulcer of sacral region, stage 2 E11.622 Type 2 diabetes mellitus with other skin ulcer N18.6 End stage renal disease Z99.2 Dependence on renal dialysis Follow-up Appointments Return appointment in 3 weeks. - Dr. Celine Ahr - Room 2 - 8/16 at 12:45 PM Bathing/  Shower/ Hygiene May shower with protection but do not get wound dressing(s) wet. Off-Loading Turn and reposition every 2 hours [facility] wound care orders this week; continue Home Health for wound care. May utilize formulary equivalent dressing for wound treatment orders unless otherwise specified. Dressing changes to be completed by [facility] on Monday / Wednesday / Friday except when patient has scheduled visit at [facility]. Other Home Health Orders/Instructions: - [facility]ound [facility] Wound #8 - Sacrum Cleanser: Wound Cleanser 1 x Per Day/30 Days Discharge Instructions: Cleanse the wound with wound cleanser prior to applying a clean dressing using gauze sponges, not tissue or cotton balls. Peri-Wound Care: Skin Prep 1 x Per Day/30 Days Discharge Instructions: Use skin prep as directed Prim Dressing: KerraCel Ag Gelling Fiber Dressing, 2x2 in (silver alginate) 1 x Per Day/30 Days ary Discharge Instructions: Apply silver alginate to wound bed as instructed Secondary Dressing: ALLEVYN Gentle Border, 3x3 (in/in) 1 x Per Day/30 Days Discharge Instructions: Apply over primary dressing as directed. Electronic Signature(s) Signed: 11/25/2021 2:58:48 PM By: Fredirick Maudlin MD FACS Entered By: Fredirick Maudlin on 11/25/2021 14:36:13 -------------------------------------------------------------------------------- Problem List Details Patient Name: Date of Service: [name] DA YALDA, HERD 11/25/2021 1:30 PM Medical Record Number: [id number] Patient Account Number: [id number] Date of Birth/Sex: Treating RN: 1980-12-14 (41 y.o. Harlow Ohms Primary Care Provider: SITA FA LWA LLA, SHA RMIN Other Clinician: Referring Provider: Treating Provider/Extender: Fredirick Maudlin SITA FA LWA LLA, SHA RMIN Weeks in Treatment: 0 Active Problems ICD-10 Encounter Code Description Active Date MDM Diagnosis L89.152 Pressure ulcer of sacral region, stage 2 11/25/2021 No  Yes E11.622 Type 2 diabetes mellitus with other skin ulcer 11/25/2021 No Yes N18.6 End stage renal disease 11/25/2021 No Yes Z99.2 Dependence on renal dialysis 11/25/2021 No Yes Inactive Problems Resolved Problems Electronic Signature(s) Signed: 11/25/2021 2:23:26 PM By: Fredirick Maudlin MD FACS Entered By: Fredirick Maudlin on 11/25/2021 14:23:26 -------------------------------------------------------------------------------- Progress Note Details Patient Name: Date of Service: [name] Barnett, [place] 11/25/2021 1:30 PM Medical Record Number: [id number] Patient Account Number: [id number] Date of Birth/Sex: Treating RN: 1980-05-25 (41 y.o. Harlow Ohms Primary Care Provider: SITA FA LWA LLA, SHA RMIN Other Clinician: Referring Provider: Treating Provider/Extender: Fredirick Maudlin SITA FA LWA LLA, SHA RMIN Weeks in Treatment: 0 Subjective Chief Complaint Information obtained from Patient 06/15/2019; patient is here for review of the wound on her lower sacrum 12/31/2020; patient is here for review of the wound on her right buttock 11/25/2021: Patient is here for evaluation and management of a stage II sacral pressure ulcer. History of Present Illness (HPI) Admission 06/15/2019 This is a 41 year old woman who comes to [place] from [facility] skilled facility in [facility]. I am able to follow a lot of her history on care everywhere in epic. The patient is a type II diabetic and has been on dialysis. 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 I am not exactly  sure of the nature of the stroke at this present time. She has made a decent recovery and is now up walking with a walker. In August 2020 she was admitted to [facility] in [place] with L4-L5 discitis. Culture and sensitivity showed Klebsiella. She was discharged on 8 weeks of ceftazidime. In mid September she is readmitted to the hospital with a  pressure ulcer on her lower sacrum and necrotizing fasciitis. She required an extensive surgical debridement. CT scan of the pelvis at 1 point showed communication with the rectum. A colostomy was recommended and placed. Culture of this area showed Klebsiella and yeast. She was discharged on an extended course of meropenem and Diflucan. [facility] [place] We do not have any of these records. I think she was discharged to another nursing home but is come to [facility] skilled facility in [facility]. She was sent down here for review of the remaining wound. By review of the pictures that the patient had from September this is made considerable improvement. She tells me that the wound VAC was discontinued at the end of December 2020. Currently the facility is using a form of silver alginate to the wound surface. Past medical history type 2 diabetes with chronic renal failure on dialysis Tuesday Thursday and Saturday. CVA with left hemiparesis in June 2020 but she appears to be making a good recovery. Chronic diastolic heart failure, hypertension 2/19; patient's wound area slightly smaller. The area that was towards her anus has closed over. We are using Santyl covered with Hydrofera Blue. We have not been able to get her lab work apparently her veins are very difficult for phlebotomy. They are trying to get the blood work done where she dialyzes in [facility] but that requires some logistical issues. 2/26; not much change in the wound. Still a nonviable surface we have been using Santyl and Hydrofera Blue. I changed her to [facility] only. She tells me she has rigorously offloading this area she also complains of some pain 3/5; still no change here. I switch to pure Hydrofera Blue. She still complains of a lot of pain although is a big wound it seems out of proportion to what I might expect. This was initially a surgical wound  secondary to necrotizing wound infection. She is on dialysis. Other than that she thinks she is offloading this fairly rigorously. She has not been systemically unwell 3/26; I switched to Iodoflex 3 weeks ago. This really seems to have helped, much better surface and slight improvement in the surface area. 4/26; the patient is still on Iodoflex she missed an appointment 2 weeks ago. Doing slightly smaller. She is on dialysis 5/10; using Hydrofera Blue. The major improvement here is the complete lack of depth. She has islands of epithelialization 09/24/19-Patient comes in at 2 weeks, dimensions about the same, there is an [place] of epithelium at margin of wound, no slough noted 10/15/2019 upon evaluation today patient appears to be doing well with regard to her wound. She has been tolerating the dressing changes in the sacral region without complication and at this point she seems to be healing quite nicely. I am extremely pleased with how things are progressing and the epithelial tissue seems to be spreading from the central portion as well which is also excellent. 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 We have been using Hydrofera Blue. The smaller  area which has depth at 0.7 cm there is a question about whether they have been packing anything into this or just flaring Hydrofera Blue over the top 9/23; its been more than 2 months since this patient was here. She still has a small area distally which is the remanent of her stage IV wound. They have been using Hydrofera Blue. She also has an area on the left gluteal 10/22; 1 month follow-up. I think the original sacral wound is closed however the patient has 2 areas distally 1 over the coccyx a small pinpoint area and the other into the gluteal cleft. Neither 1 of these looks particularly infected. I thought they might be connected but they were not. 11/15; the original sacral wound is  closed patient only has 1 remaining area. 1 of these is in the gluteal cleft just below the coccyx. The area over the coccyx from last time I think is all so closed 12/31/2020; this is a patient that we saw for a pressure ulcer over her lower sacrum and coccyx for a long period of time in 2021. She eventually healed over. She is at [facility] skilled facility largely secondary to a remote CVA she had. She is also end-stage renal disease on dialysis secondary to type 2 diabetes. She tells [place] that since about mid June she has had an area more distally than the original sacrum/coccyx wound it looks as though they are putting silver alginate in this area. She is limited in her ability to offload this I think spending most of her time in a wheelchair although she can stand to transfer. She has an ostomy During her original stay here in 2021 I also list that she had a more distal area into her perineum that closed over well before the more proximal coccyx/sacral wound. She had a CT scan of the abdomen and pelvis on 12/01/2020. This did note first chronic sacral decubitus along the right aspect of her distal sacrum and coccyx. There was not felt to be bone destruction she has chronic changes of bilateral sacroiliitis and chronic endplate destruction and sclerosis at L4-L5 secondary to previous osteomyelitis however no new findings were felt to be seen in terms of bone problems in this area 01/14/2021; 2-week follow-up. Her wound is smaller essentially in the coccyx area. We have been using silver collagen. 10/5; 3-week follow-up. The patient returns with her area in the coccyx healed however distally in the perineum there is a fairly long stage II area. I am assuming this patient's wound is a pressure related area. Although this would not be a usual pressure area for a normal situation her immobility secondary to remote CVA, dialysis etc. make her at risk for pressure areas in an otherwise unusual setting. The  patient is fairly upset stating that we were told about this last time. I do not have this in my records. Her original wound was small and just about healed at that point and this visit was arranged in follow-up. If there was a new wound more distally into her perineum we did not look at it 11/2; patient's wound is slightly smaller. This is in the gluteal cleft on the left. Using silver alginate. She tells me she is aggressively offloading this area. 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 We have been using silver alginate. Arrives in clinic today with the wound larger. 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 We have been using silver alginate. PCR culture I did last week was polymicrobial with multiple organisms we put Polysporin under the silver alginate at the facility. According to the patient this got started 05/13/2021; the wound looks slightly smaller. This is in the gluteal cleft on the left. She is using silver alginate with topical antibiotics underneath. The topical antibiotics relate to a deep tissue culture I did some weeks ago. This showed a lawn of low titer bacteria. I am not sure how much she is offloading this area both at dialysis and during the day at the facility would be the other issue 2/8; continued nice improvement the wound is smaller. This is on the gluteal cleft on the left. She is using topical antibiotics/triple antibiotic with silver alginate. 07/08/2021: The wound in her perineum continues to improve. Daily dressing changes with triple antibiotic and silver alginate are being employed. There is a small amount of slough at the base. A new problem today is that somehow she suffered a traumatic laceration to her left  fourth toe. It is bleeding briskly in clinic. She is insensate in that area and has no idea how the injury occurred. 07/14/2021: The perineal wound looks a bit better, but unfortunately, the sacral wound that was just cranial to this has reopened. It is stage II with minimal debris and slough. The traumatic laceration to her left fourth toe has a gray thick slough. She denies any fevers, chills, or other concerns for systemic infection. 07/24/2021: Both the perineal and sacral wounds appear roughly the same today with minimal slough. The traumatic laceration to her left fourth toe looks a bit worse with necrosis of muscle and exposed tendon. No fevers or chills or any concern for systemic infection. 07/31/2021: Both perineal and sacral wounds are perhaps a little bit smaller and are very clean today. The traumatic laceration to her left fourth toe is markedly improved. No necrotic tissue was appreciated. She apparently had her x-ray performed at [facility] but it was not forwarded to [place] for review of the results. It appears that her infectious disease doctor has also referred her to podiatry because of the toe. 08/14/2021: The perineal wound is about the same size, but the sacral wound has contracted quite substantially. The traumatic laceration to her left fourth toe has made the most dramatic improvement. Minimal slough and the wound is closing up. We have been using Santyl and Hydrofera Blue on the toe and silver alginate to both the sacral and perineal wounds. 08/26/2021: The wound on her toe is nearly closed. The perineal wound is stable and the sacral wound is smaller. No concern for infection. READMISSION 11/25/2021 After our last visit, the patient left her skilled nursing facility. She has not been seen or received any significant wound care since then. Her toe is healed and her perineal wound has also epithelialized. She just has a small stage II sacral ulcer. The surface has slough  accumulation and there is some heaped up senescent skin around the borders. Patient History Information obtained from Patient, Chart. Allergies lisinopril (Severity: Moderate, Reaction: hair falls out, lip swelling), morphine (Reaction: hallucinations), corn (Reaction: throat swelling) Family History Diabetes - Maternal Grandparents, Heart Disease - Mother,Father, Hypertension - Maternal Grandparents, Kidney Disease - Maternal Grandparents, Lung Disease - Mother, Stroke - Mother,Father, Thyroid Problems -  Mother,Siblings, No family history of Cancer, Hereditary Spherocytosis, Seizures, Tuberculosis. Social History Never smoker, Marital Status - Separated, Alcohol Use - Never, Drug Use - No History, Caffeine Use - Daily - coffee. Medical History Eyes Patient has history of Cataracts, Glaucoma Ear/Nose/Mouth/Throat Denies history of Chronic sinus problems/congestion, Middle ear problems Hematologic/Lymphatic Patient has history of Anemia Cardiovascular Patient has history of Congestive Heart Failure, Hypertension Denies history of Angina, Arrhythmia, Coronary Artery Disease, Deep Vein Thrombosis, Hypotension, Myocardial Infarction, Peripheral Arterial Disease, Peripheral Venous Disease, Phlebitis, Vasculitis Endocrine Patient has history of Type II Diabetes Genitourinary Patient has history of End Stage Renal Disease - HD Integumentary (Skin) Denies history of History of Burn Musculoskeletal Denies history of Gout, Rheumatoid Arthritis, Osteoarthritis, Osteomyelitis Neurologic Patient has history of Neuropathy Denies history of Dementia, Quadriplegia, Paraplegia, Seizure Disorder Oncologic Denies history of Received Chemotherapy, Received Radiation Psychiatric Patient has history of Confinement Anxiety Denies history of Anorexia/bulimia Hospitalization/Surgery History - right arm AV fistula. - cardiac cath with stents. - sacral wound debridement. - left knee surgery. - catract  extraction. - colostomy. Medical A Surgical History Notes nd Cardiovascular hyperlipidemia Gastrointestinal GERD, diverting colostomy Musculoskeletal left side weakness Neurologic CVA left mild hemiplegia, lumbar discitis Psychiatric depression, anxiety Objective Constitutional No acute distress. Vitals Time Taken: 1:55 PM, Height: 65 in, Source: Stated, Weight: 170 lbs, Source: Stated, BMI: 28.3, Temperature: 99..3 F, Pulse: 78 bpm, Respiratory Rate: 16 breaths/min, Blood Pressure: 122/79 mmHg, Capillary Blood Glucose: 199 mg/dl. General Notes: 11/25/2021: She just has a small stage II sacral ulcer. The surface has slough accumulation and there is some heaped up senescent skin around the borders. Integumentary (Hair, Skin) Wound #8 status is Open. Original cause of wound was Pressure Injury. The date acquired was: 07/15/2021. The wound has been in treatment 19 weeks. The wound is located on the Sacrum. The wound measures 0.4cm length x 0.2cm width x 0.1cm depth; 0.063cm^2 area and 0.006cm^3 volume. There is Fat Layer (Subcutaneous Tissue) exposed. There is no tunneling or undermining noted. There is a medium amount of serosanguineous drainage noted. The wound margin is distinct with the outline attached to the wound base. There is large (67-100%) pink granulation within the wound bed. There is a small (1-33%) amount of necrotic tissue within the wound bed including Adherent Slough. Assessment Active Problems ICD-10 Pressure ulcer of sacral region, stage 2 Type 2 diabetes mellitus with other skin ulcer End stage renal disease Dependence on renal dialysis Procedures Wound #8 Pre-procedure diagnosis of Wound #8 is a Pressure Ulcer located on the Sacrum . There was a Excisional Skin/Subcutaneous Tissue Debridement with a total area of 0.08 sq cm performed by Fredirick Maudlin, MD. With the following instrument(s): Curette to remove Non-Viable tissue/material. Material  removed includes Subcutaneous Tissue and Skin: Epidermis and after achieving pain control using Lidocaine 4% T opical Solution. No specimens were taken. A time out was conducted at 14:07, prior to the start of the procedure. A Minimum amount of bleeding was controlled with Pressure. The procedure was tolerated well with a pain level of 0 throughout and a pain level of 0 following the procedure. Post Debridement Measurements: 0.4cm length x 0.2cm width x 0.1cm depth; 0.006cm^3 volume. Post debridement Stage noted as Category/Stage II. Character of Wound/Ulcer Post Debridement is improved. Post procedure Diagnosis Wound #8: Same as Pre-Procedure Plan Follow-up Appointments: Return appointment in 3 weeks. - Dr. Celine Ahr - Room 2 - 8/16 at 12:45 PM Bathing/ Shower/ Hygiene: May shower with protection but do not get wound dressing(s)  wet. Off-Loading: Turn and reposition every 2 hours Home Health: New wound care orders this week; continue Home Health for wound care. May utilize formulary equivalent dressing for wound treatment orders unless otherwise specified. Dressing changes to be completed by [facility] on Monday / Wednesday / Friday except when patient has scheduled visit at [facility]. Other Home Health Orders/Instructions: - [facility] WOUND #8: - Sacrum Wound Laterality: Cleanser: Wound Cleanser 1 x Per Day/30 Days Discharge Instructions: Cleanse the wound with wound cleanser prior to applying a clean dressing using gauze sponges, not tissue or cotton balls. Peri-Wound Care: Skin Prep 1 x Per Day/30 Days Discharge Instructions: Use skin prep as directed Prim Dressing: KerraCel Ag Gelling Fiber Dressing, 2x2 in (silver alginate) 1 x Per Day/30 Days ary Discharge Instructions: Apply silver alginate to wound bed as instructed Secondary Dressing: ALLEVYN Gentle Border, 3x3 (in/in) 1 x Per Day/30 Days Discharge Instructions: Apply over primary dressing as directed. 11/25/2021: She just  has a small stage II sacral ulcer. The surface has slough accumulation and there is some heaped up senescent skin around the borders. I used a curette to debride slough, nonviable subcutaneous tissue, and periwound epiboly from the site. We will use silver alginate and a foam border dressing. Follow-up in 3 weeks, per patient request. Electronic Signature(s) Signed: 11/25/2021 2:38:41 PM By: Fredirick Maudlin MD FACS Entered By: Fredirick Maudlin on 11/25/2021 14:38:41 -------------------------------------------------------------------------------- HxROS Details Patient Name: Date of Service: [name] Barnett, [place] 11/25/2021 1:30 PM Medical Record Number: [id number] Patient Account Number: [id number] Date of Birth/Sex: Treating RN: Sep 25, 1980 (41 y.o. Harlow Ohms Primary Care Provider: SITA FA LWA LLA, SHA RMIN Other Clinician: Referring Provider: Treating Provider/Extender: Fredirick Maudlin SITA FA LWA LLA, SHA RMIN Weeks in Treatment: 0 Information Obtained From Patient Chart Eyes Medical History: Positive for: Cataracts; Glaucoma Ear/Nose/Mouth/Throat Medical History: Negative for: Chronic sinus problems/congestion; Middle ear problems Hematologic/Lymphatic Medical History: Positive for: Anemia Cardiovascular Medical History: Positive for: Congestive Heart Failure; Hypertension Negative for: Angina; Arrhythmia; Coronary Artery Disease; Deep Vein Thrombosis; Hypotension; Myocardial Infarction; Peripheral Arterial Disease; Peripheral Venous Disease; Phlebitis; Vasculitis Past Medical History Notes: hyperlipidemia Gastrointestinal Medical History: Past Medical History Notes: GERD, diverting colostomy Endocrine Medical History: Positive for: Type II Diabetes Time with diabetes: 45 yrs Treated with: Insulin Blood sugar tested every day: Yes Tested : 3 times per day Genitourinary Medical History: Positive for: End Stage Renal Disease - HD Integumentary  (Skin) Medical History: Negative for: History of Burn Musculoskeletal Medical History: Negative for: Gout; Rheumatoid Arthritis; Osteoarthritis; Osteomyelitis Past Medical History Notes: left side weakness Neurologic Medical History: Positive for: Neuropathy Negative for: Dementia; Quadriplegia; Paraplegia; Seizure Disorder Past Medical History Notes: CVA left mild hemiplegia, lumbar discitis Oncologic Medical History: Negative for: Received Chemotherapy; Received Radiation Psychiatric Medical History: Positive for: Confinement Anxiety Negative for: Anorexia/bulimia Past Medical History Notes: depression, anxiety HBO Extended History Items Eyes: Eyes: Cataracts Glaucoma Immunizations Pneumococcal Vaccine: Received Pneumococcal Vaccination: Yes Received Pneumococcal Vaccination On or After 60th Birthday: No Implantable Devices Yes Hospitalization / Surgery History Type of Hospitalization/Surgery right arm AV fistula cardiac cath with stents sacral wound debridement left knee surgery catract extraction colostomy Family and Social History Cancer: No; Diabetes: Yes - Maternal Grandparents; Heart Disease: Yes - Mother,Father; Hereditary Spherocytosis: No; Hypertension: Yes - Maternal Grandparents; Kidney Disease: Yes - Maternal Grandparents; Lung Disease: Yes - Mother; Seizures: No; Stroke: Yes - Mother,Father; Thyroid Problems: Yes - Mother,Siblings; Tuberculosis: No; Never smoker; Marital Status - Separated; Alcohol Use: Never; Drug Use:  No History; Caffeine Use: Daily - coffee; Financial Concerns: No; Food, Clothing or Shelter Needs: No; Support System Lacking: No; Transportation Concerns: No Electronic Signature(s) Signed: 11/25/2021 2:58:48 PM By: Fredirick Maudlin MD FACS Signed: 11/25/2021 4:59:32 PM By: Adline Peals Entered By: Adline Peals on 11/25/2021 13:57:08 -------------------------------------------------------------------------------- Emma  Details Patient Name: Date of Service: [name] Barnett, [place] 11/25/2021 Medical Record Number: [id number] Patient Account Number: [id number] Date of Birth/Sex: Treating RN: [birth year] (42 y.o. Harlow Ohms Primary Care Provider: SITA FA LWA LLA, SHA RMIN Other Clinician: Referring Provider: Treating Provider/Extender: Fredirick Maudlin SITA FA LWA LLA, SHA RMIN Weeks in Treatment: 0 Diagnosis Coding ICD-10 Codes Code Description W09.811 Pressure ulcer of sacral region, stage 2 E11.622 Type 2 diabetes mellitus with other skin ulcer N18.6 End stage renal disease Z99.2 Dependence on renal dialysis Facility Procedures CPT4 Code: [id number] Description: 62130 - WOUND CARE VISIT-LEV 3 EST PT Modifier: 25 Quantity: 1 CPT4 Code: [id number] Description: 62952 - DEB SUBQ TISSUE 20 SQ CM/< ICD-10 Diagnosis Description L89.152 Pressure ulcer of sacral region, stage 2 Modifier: Quantity: 1 Physician Procedures : CPT4 Code Description Modifier [id number] 99214 - WC PHYS LEVEL 4 - EST PT 25 ICD-10 Diagnosis Description L89.152 Pressure ulcer of sacral region, stage 2 E11.622 Type 2 diabetes mellitus with other skin ulcer N18.6 End stage renal disease Z99.2  Dependence on renal dialysis [id number] 11042 - WC PHYS SUBQ TISS 20 SQ CM 1 ICD-10 Diagnosis Description L89.152 Pressure ulcer of sacral region, stage 2 Quantity: 1 Electronic Signature(s) Signed: 11/25/2021 4:59:32 PM By: Adline Peals Signed: 11/26/2021 9:16:21 AM By: Fredirick Maudlin MD FACS Previous Signature: 11/25/2021 2:39:00 PM Version By: Fredirick Maudlin MD FACS Entered By: Adline Peals on 11/25/2021 16:32:23

## 2021-12-01 DIAGNOSIS — Z992 Dependence on renal dialysis: Secondary | ICD-10-CM | POA: Diagnosis not present

## 2021-12-01 DIAGNOSIS — N2581 Secondary hyperparathyroidism of renal origin: Secondary | ICD-10-CM | POA: Diagnosis not present

## 2021-12-01 DIAGNOSIS — N186 End stage renal disease: Secondary | ICD-10-CM | POA: Diagnosis not present

## 2021-12-03 DIAGNOSIS — N186 End stage renal disease: Secondary | ICD-10-CM | POA: Diagnosis not present

## 2021-12-03 DIAGNOSIS — N2581 Secondary hyperparathyroidism of renal origin: Secondary | ICD-10-CM | POA: Diagnosis not present

## 2021-12-03 DIAGNOSIS — Z992 Dependence on renal dialysis: Secondary | ICD-10-CM | POA: Diagnosis not present

## 2021-12-05 DIAGNOSIS — N186 End stage renal disease: Secondary | ICD-10-CM | POA: Diagnosis not present

## 2021-12-05 DIAGNOSIS — N2581 Secondary hyperparathyroidism of renal origin: Secondary | ICD-10-CM | POA: Diagnosis not present

## 2021-12-05 DIAGNOSIS — Z992 Dependence on renal dialysis: Secondary | ICD-10-CM | POA: Diagnosis not present

## 2021-12-08 DIAGNOSIS — N2581 Secondary hyperparathyroidism of renal origin: Secondary | ICD-10-CM | POA: Diagnosis not present

## 2021-12-08 DIAGNOSIS — N186 End stage renal disease: Secondary | ICD-10-CM | POA: Diagnosis not present

## 2021-12-08 DIAGNOSIS — Z992 Dependence on renal dialysis: Secondary | ICD-10-CM | POA: Diagnosis not present

## 2021-12-10 DIAGNOSIS — N2581 Secondary hyperparathyroidism of renal origin: Secondary | ICD-10-CM | POA: Diagnosis not present

## 2021-12-10 DIAGNOSIS — Z992 Dependence on renal dialysis: Secondary | ICD-10-CM | POA: Diagnosis not present

## 2021-12-10 DIAGNOSIS — N186 End stage renal disease: Secondary | ICD-10-CM | POA: Diagnosis not present

## 2021-12-11 ENCOUNTER — Encounter: Payer: Self-pay | Admitting: Specialist

## 2021-12-11 ENCOUNTER — Ambulatory Visit (INDEPENDENT_AMBULATORY_CARE_PROVIDER_SITE_OTHER): Payer: Medicare HMO | Admitting: Specialist

## 2021-12-11 VITALS — BP 158/98 | HR 80 | Ht 65.0 in | Wt 201.0 lb

## 2021-12-11 DIAGNOSIS — M4620 Osteomyelitis of vertebra, site unspecified: Secondary | ICD-10-CM

## 2021-12-11 DIAGNOSIS — R29898 Other symptoms and signs involving the musculoskeletal system: Secondary | ICD-10-CM

## 2021-12-11 DIAGNOSIS — S82891A Other fracture of right lower leg, initial encounter for closed fracture: Secondary | ICD-10-CM | POA: Diagnosis not present

## 2021-12-11 DIAGNOSIS — G839 Paralytic syndrome, unspecified: Secondary | ICD-10-CM

## 2021-12-11 DIAGNOSIS — M4646 Discitis, unspecified, lumbar region: Secondary | ICD-10-CM | POA: Diagnosis not present

## 2021-12-11 DIAGNOSIS — M4726 Other spondylosis with radiculopathy, lumbar region: Secondary | ICD-10-CM | POA: Diagnosis not present

## 2021-12-11 DIAGNOSIS — M62452 Contracture of muscle, left thigh: Secondary | ICD-10-CM | POA: Diagnosis not present

## 2021-12-11 DIAGNOSIS — M62469 Contracture of muscle, unspecified lower leg: Secondary | ICD-10-CM

## 2021-12-11 DIAGNOSIS — G8194 Hemiplegia, unspecified affecting left nondominant side: Secondary | ICD-10-CM

## 2021-12-11 NOTE — Progress Notes (Signed)
Office Visit Note   Patient: [name] Barnett           Date of Birth: 1980/08/08           MRN: [id number] Visit Date: 12/11/2021              Requested by: [name] Handler, DO [address] [place],  [place] [address] PCP: [name] Handler, DO   Assessment & Plan: Visit Diagnoses:  1. 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 Contracture of left hamstring   3. Closed fracture of right ankle, initial encounter   4. Other spondylosis with radiculopathy, lumbar region   5. Leg weakness, bilateral   6. Left hemiparesis ([place])   7. Discitis of lumbar region   8. Vertebral osteomyelitis ([place])     Plan: PT at [facility] for gait training and left knee contracture. Surveillance for vertebral osteomyelitis. To have left HC lengthening eventually.   Follow-Up Instructions: Return in about 2 months (around 02/10/2022).   Orders:  Orders Placed This Encounter  Procedures   Ambulatory referral to Physical Therapy   No orders of the defined types were placed in this encounter.     Procedures: No procedures performed   Clinical Data: No additional findings.   Subjective: Chief Complaint  Patient presents with   Lower Back - Pain   Right Leg - Pain   Left Leg - Pain    41 year old female with history of CVA, Lumbar discitis and end stage renal failure, dialysis dependent. She is undergoing dialysis treatments in [place] and previously was in a SNF. Now she is in an in an assisted living [place]. She was last seen about 3.5 months ago and referred to [name] Barnett to have evaluated and he recommended she consider a left heel cord lengthening to assist her ability to return to a more normal gait. She has a left knee flexion contracture.     Review of Systems  Constitutional: Negative.   HENT: Negative.    Eyes: Negative.   Respiratory: Negative.    Cardiovascular: Negative.   Gastrointestinal: Negative.    Endocrine: Negative.   Genitourinary: Negative.   Musculoskeletal: Negative.   Skin: Negative.   Allergic/Immunologic: Negative.   Neurological: Negative.   Hematological: Negative.   Psychiatric/Behavioral: Negative.       Objective: Vital Signs: BP (!) 158/98 (BP Location: Left Arm, Patient Position: Sitting)   Pulse 80   Ht 5\' 5"  (1.651 m)   Wt 201 lb (91.2 kg)   BMI 33.45 kg/m   Physical Exam Constitutional:      Appearance: She is well-developed.  HENT:     Head: Normocephalic and atraumatic.  Eyes:     Pupils: Pupils are equal, round, and reactive to light.  Pulmonary:     Effort: Pulmonary effort is normal.     Breath sounds: Normal breath sounds.  Abdominal:     General: Bowel sounds are normal.     Palpations: Abdomen is soft.  Musculoskeletal:     Cervical back: Normal range of motion and neck supple.     Lumbar back: Negative right straight leg raise test and negative left straight leg raise test.  Skin:    General: Skin is warm and dry.  Neurological:     Mental Status: She is alert and oriented to person, place, and time.  Psychiatric:        Behavior: Behavior normal.  Thought Content: Thought content normal.        Judgment: Judgment normal.    Back Exam   Tenderness  The patient is experiencing tenderness in the lumbar.  Range of Motion  Extension:  abnormal  Flexion:  abnormal  Lateral bend right:  abnormal  Lateral bend left:  abnormal  Rotation right:  abnormal  Rotation left:  abnormal   Muscle Strength  Right Quadriceps:  5/5  Left Quadriceps:  5/5  Right Hamstrings:  5/5  Left Hamstrings:  5/5   Tests  Straight leg raise right: negative Straight leg raise left: negative  Reflexes  Patellar:  2/4 Achilles:  Hyporeflexic  Other  Toe walk: abnormal Heel walk: abnormal Sensation: normal  Comments:  Left foot drop is improving with left heel cord contracture  only neutral with DF of ankle. Left knee with extension  lag 15-20 degrees.      Specialty Comments:  No specialty comments available.  Imaging: No results found.   PMFS History: Patient Active Problem List   Diagnosis Date Noted   Pressure injury of skin 10/01/2021   Chronic kidney disease (CKD) stage G5/A1, glomerular filtration rate (GFR) less than or equal to 15 mL/min/1.73 square meter and albuminuria creatinine ratio less than 30 mg/g (HCC) 06/22/2021   Dialysis patient ([place]) 06/22/2021   BV (bacterial vaginosis) 04/02/2020   Vaginal itching 04/02/2020   Vaginal odor 04/02/2020   Vaginal discharge 04/02/2020   Sepsis secondary to UTI ([place]) 01/13/2020   Acute respiratory failure with hypoxia (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[place]) 01/13/2020   Essential hypertension 01/13/2020   Hyperlipidemia 01/13/2020   Type 2 diabetes mellitus ([place]) 01/13/2020   Past Medical History:  Diagnosis Date   Diabetes mellitus without complication ([place])    Hyperlipidemia    Hypertension    Renal disorder    Stroke ([place])    Vitamin D deficiency     Family History  Problem Relation Age of Onset   Cancer Paternal Grandmother    Heart disease Father    Hypertension Father    Diabetes Father    Glaucoma Father    Heart disease Mother    Hypertension Mother    Diabetes Mother    Diabetes Sister        borderline   Hypertension Sister     Past Surgical History:  Procedure Laterality Date   cardiac stents     CATARACT EXTRACTION W/PHACO Left 08/29/2020   Procedure: CATARACT EXTRACTION PHACO AND INTRAOCULAR LENS PLACEMENT LEFT EYE;  Surgeon: Baruch Goldmann, MD;  Location: [facility];  Service: Ophthalmology;  Laterality: Left;  left CDE=29.30   CATARACT EXTRACTION W/PHACO Right 11/28/2020   Procedure: CATARACT EXTRACTION PHACO AND INTRAOCULAR LENS PLACEMENT (IOC);  Surgeon: Baruch Goldmann, MD;  Location: [facility];  Service: Ophthalmology;  Laterality:  Right;  CDE 4.35   COLON SURGERY     COLOSTOMY     TUBAL LIGATION     wound on buttocks     Social History   Occupational History   Not on file  Tobacco Use   Smoking status: Never   Smokeless tobacco: Never  Vaping Use   Vaping Use: Never used  Substance and Sexual Activity   Alcohol use: Never   Drug use: Never   Sexual activity: Not Currently    Birth control/protection: Surgical    Comment: tubal

## 2021-12-11 NOTE — Patient Instructions (Signed)
PT at [facility] for gait training and left knee contracture. Surveillance for vertebral osteomyelitis. To have left HC lengthening eventually.

## 2021-12-12 DIAGNOSIS — Z992 Dependence on renal dialysis: Secondary | ICD-10-CM | POA: Diagnosis not present

## 2021-12-12 DIAGNOSIS — N186 End stage renal disease: Secondary | ICD-10-CM | POA: Diagnosis not present

## 2021-12-12 DIAGNOSIS — N2581 Secondary hyperparathyroidism of renal origin: Secondary | ICD-10-CM | POA: Diagnosis not present

## 2021-12-15 DIAGNOSIS — N2581 Secondary hyperparathyroidism of renal origin: Secondary | ICD-10-CM | POA: Diagnosis not present

## 2021-12-15 DIAGNOSIS — Z992 Dependence on renal dialysis: Secondary | ICD-10-CM | POA: Diagnosis not present

## 2021-12-15 DIAGNOSIS — N186 End stage renal disease: Secondary | ICD-10-CM | POA: Diagnosis not present

## 2021-12-16 ENCOUNTER — Encounter (HOSPITAL_BASED_OUTPATIENT_CLINIC_OR_DEPARTMENT_OTHER): Payer: Medicare HMO | Attending: General Surgery | Admitting: General Surgery

## 2021-12-16 DIAGNOSIS — E1122 Type 2 diabetes mellitus with diabetic chronic kidney disease: Secondary | ICD-10-CM | POA: Diagnosis not present

## 2021-12-16 DIAGNOSIS — Z992 Dependence on renal dialysis: Secondary | ICD-10-CM | POA: Diagnosis not present

## 2021-12-16 DIAGNOSIS — N186 End stage renal disease: Secondary | ICD-10-CM | POA: Diagnosis not present

## 2021-12-16 DIAGNOSIS — L89152 Pressure ulcer of sacral region, stage 2: Secondary | ICD-10-CM | POA: Insufficient documentation

## 2021-12-16 DIAGNOSIS — I69354 Hemiplegia and hemiparesis following cerebral infarction affecting left non-dominant side: Secondary | ICD-10-CM | POA: Diagnosis not present

## 2021-12-16 DIAGNOSIS — I5032 Chronic diastolic (congestive) heart failure: Secondary | ICD-10-CM | POA: Diagnosis not present

## 2021-12-16 DIAGNOSIS — I132 Hypertensive heart and chronic kidney disease with heart failure and with stage 5 chronic kidney disease, or end stage renal disease: Secondary | ICD-10-CM | POA: Insufficient documentation

## 2021-12-17 DIAGNOSIS — Z992 Dependence on renal dialysis: Secondary | ICD-10-CM | POA: Diagnosis not present

## 2021-12-17 DIAGNOSIS — N2581 Secondary hyperparathyroidism of renal origin: Secondary | ICD-10-CM | POA: Diagnosis not present

## 2021-12-17 DIAGNOSIS — E11622 Type 2 diabetes mellitus with other skin ulcer: Secondary | ICD-10-CM | POA: Diagnosis not present

## 2021-12-17 DIAGNOSIS — L89152 Pressure ulcer of sacral region, stage 2: Secondary | ICD-10-CM | POA: Diagnosis not present

## 2021-12-17 DIAGNOSIS — N186 End stage renal disease: Secondary | ICD-10-CM | POA: Diagnosis not present

## 2021-12-17 NOTE — Progress Notes (Signed)
[name] Barnett, [name] Barnett ([id number]) Visit Report for 12/16/2021 Chief Complaint Document Details Patient Name: Date of Service: [name] Barnett, [name] Barnett 12/16/2021 2:45 PM Medical Record Number: [id number] Patient Account Number: [id number] Date of Birth/Sex: Treating RN: 11-03-1980 (41 y.o. Harlow Ohms Primary Care Provider: Julianne Handler Other Clinician: Referring Provider: Treating Provider/Extender: Eben Burow, Sharmin Weeks in Treatment: 3 Information Obtained from: Patient Chief Complaint 06/15/2019; patient is here for review of the wound on her lower sacrum 12/31/2020; patient is here for review of the wound on her right buttock 11/25/2021: Patient is here for evaluation and management of a stage II sacral pressure ulcer. Electronic Signature(s) Signed: 12/16/2021 2:24:44 PM By: Fredirick Maudlin MD FACS Entered By: Fredirick Maudlin on 12/16/2021 14:24:43 -------------------------------------------------------------------------------- Debridement Details Patient Name: Date of Service: [name] Barnett, [name] Barnett 12/16/2021 2:45 PM Medical Record Number: [id number] Patient Account Number: [id number] Date of Birth/Sex: Treating RN: 08-30-1980 (41 y.o. America Brown Primary Care Provider: Julianne Handler Other Clinician: Referring Provider: Treating Provider/Extender: Eben Burow, Sharmin Weeks in Treatment: 3 Debridement Performed for Assessment: Wound #8 Sacrum Performed By: Physician Fredirick Maudlin, MD Debridement Type: Debridement Level of Consciousness (Pre-procedure): Awake and Alert Pre-procedure Verification/Time Out Yes - 14:12 Taken: Start Time: 14:12 Pain Control: Lidocaine 5% topical ointment T Area Debrided (L x W): otal 0.3 (cm) x 0.2 (cm) = 0.06 (cm) Tissue and other material debrided: Non-Viable, Eschar, Other: dead skin Level: Non-Viable Tissue Debridement Description: Selective/Open  Wound Instrument: Curette Bleeding: Minimum Hemostasis Achieved: Pressure End Time: 14:13 Procedural Pain: 0 Post Procedural Pain: 0 Response to Treatment: Procedure was tolerated well Level of Consciousness (Post- Awake and Alert procedure): Post Debridement Measurements of Total Wound Length: (cm) 0.3 Stage: Category/Stage II Width: (cm) 0.2 Depth: (cm) 0.1 Volume: (cm) 0.005 Character of Wound/Ulcer Post Debridement: Improved Post Procedure Diagnosis Same as Pre-procedure Electronic Signature(s) Signed: 12/16/2021 2:43:32 PM By: Fredirick Maudlin MD FACS Signed: 12/16/2021 3:42:30 PM By: Dellie Catholic RN Entered By: Dellie Catholic on 12/16/2021 14:32:16 -------------------------------------------------------------------------------- HPI Details Patient Name: Date of Service: [name] Barnett, [name] Barnett 12/16/2021 2:45 PM Medical Record Number: [id number] Patient Account Number: [id number] Date of Birth/Sex: Treating RN: 11-24-1980 (41 y.o. Harlow Ohms Primary Care Provider: Julianne Handler Other Clinician: Referring Provider: Treating Provider/Extender: Eben Burow, Sharmin Weeks in Treatment: 3 History of Present Illness HPI Description: Admission 06/15/2019 This is a 41 year old woman who comes to [place] from [facility] skilled facility in [facility]. I am able to follow a lot of her history on care everywhere in epic. The patient is a type II diabetic and has been on dialysis. 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 I am not exactly sure of the nature of the stroke at this present time. She has made a decent recovery and is now up walking with a walker. In August 2020 she was admitted to [facility] in [place] with L4-L5 discitis. Culture and sensitivity showed Klebsiella. She was discharged on 8 weeks of ceftazidime. In mid September she is readmitted to  the hospital with a pressure ulcer on her lower sacrum and necrotizing fasciitis. She required an extensive surgical debridement. CT scan of the pelvis at 1 point showed communication with the rectum. A colostomy was recommended and placed. Culture of this area showed Klebsiella and yeast. She was discharged on an extended course of meropenem and Diflucan. [facility] [place] We do not  have any of these records. I think she was discharged to another nursing home but is come to [facility] skilled facility in [facility]. She was sent down here for review of the remaining wound. By review of the pictures that the patient had from September this is made considerable improvement. She tells me that the wound VAC was discontinued at the end of December 2020. Currently the facility is using a form of silver alginate to the wound surface. Past medical history type 2 diabetes with chronic renal failure on dialysis Tuesday Thursday and Saturday. CVA with left hemiparesis in June 2020 but she appears to be making a good recovery. Chronic diastolic heart failure, hypertension 2/19; patient's wound area slightly smaller. The area that was towards her anus has closed over. We are using Santyl covered with Hydrofera Blue. We have not been able to get her lab work apparently her veins are very difficult for phlebotomy. They are trying to get the blood work done where she dialyzes in [facility] but that requires some logistical issues. 2/26; not much change in the wound. Still a nonviable surface we have been using Santyl and Hydrofera Blue. I changed her to [facility] only. She tells me she has rigorously offloading this area she also complains of some pain 3/5; still no change here. I switch to pure Hydrofera Blue. She still complains of a lot of pain although is a big wound it seems out of proportion to what I might expect. This was initially a  surgical wound secondary to necrotizing wound infection. She is on dialysis. Other than that she thinks she is offloading this fairly rigorously. She has not been systemically unwell 3/26; I switched to Iodoflex 3 weeks ago. This really seems to have helped, much better surface and slight improvement in the surface area. 4/26; the patient is still on Iodoflex she missed an appointment 2 weeks ago. Doing slightly smaller. She is on dialysis 5/10; using Hydrofera Blue. The major improvement here is the complete lack of depth. She has islands of epithelialization 09/24/19-Patient comes in at 2 weeks, dimensions about the same, there is an [place] of epithelium at margin of wound, no slough noted 10/15/2019 upon evaluation today patient appears to be doing well with regard to her wound. She has been tolerating the dressing changes in the sacral region without complication and at this point she seems to be healing quite nicely. I am extremely pleased with how things are progressing and the epithelial tissue seems to be spreading from the central portion as well which is also excellent. 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 We have been using Hydrofera Blue. The smaller area which has depth at 0.7 cm there is a question about whether they have been packing anything into this or just flaring Hydrofera Blue over the top 9/23; its been more than 2 months since this patient was here. She still has a small area distally which is the remanent of her stage IV wound. They have been using Hydrofera Blue. She also has an area on the left gluteal 10/22; 1 month follow-up. I think the original sacral wound is closed however the patient has 2 areas distally 1 over the coccyx a small pinpoint area and the other into the gluteal cleft. Neither 1 of these looks particularly infected. I thought they might be connected but they were not. 11/15; the original  sacral wound is closed  patient only has 1 remaining area. 1 of these is in the gluteal cleft just below the coccyx. The area over the coccyx from last time I think is all so closed 12/31/2020; this is a patient that we saw for a pressure ulcer over her lower sacrum and coccyx for a long period of time in 2021. She eventually healed over. She is at [facility] skilled facility largely secondary to a remote CVA she had. She is also end-stage renal disease on dialysis secondary to type 2 diabetes. She tells [place] that since about mid June she has had an area more distally than the original sacrum/coccyx wound it looks as though they are putting silver alginate in this area. She is limited in her ability to offload this I think spending most of her time in a wheelchair although she can stand to transfer. She has an ostomy During her original stay here in 2021 I also list that she had a more distal area into her perineum that closed over well before the more proximal coccyx/sacral wound. She had a CT scan of the abdomen and pelvis on 12/01/2020. This did note first chronic sacral decubitus along the right aspect of her distal sacrum and coccyx. There was not felt to be bone destruction she has chronic changes of bilateral sacroiliitis and chronic endplate destruction and sclerosis at L4-L5 secondary to previous osteomyelitis however no new findings were felt to be seen in terms of bone problems in this area 01/14/2021; 2-week follow-up. Her wound is smaller essentially in the coccyx area. We have been using silver collagen. 10/5; 3-week follow-up. The patient returns with her area in the coccyx healed however distally in the perineum there is a fairly long stage II area. I am assuming this patient's wound is a pressure related area. Although this would not be a usual pressure area for a normal situation her immobility secondary to remote CVA, dialysis etc. make her at risk for pressure areas in an otherwise  unusual setting. The patient is fairly upset stating that we were told about this last time. I do not have this in my records. Her original wound was small and just about healed at that point and this visit was arranged in follow-up. If there was a new wound more distally into her perineum we did not look at it 11/2; patient's wound is slightly smaller. This is in the gluteal cleft on the left. Using silver alginate. She tells me she is aggressively offloading this area. 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 We have been using silver alginate. Arrives in clinic today with the wound larger. 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 We have been using silver alginate. PCR culture I did last week was polymicrobial with multiple organisms we put Polysporin under the silver alginate at the facility. According to the patient this got started 05/13/2021; the wound looks slightly smaller. This is in the gluteal cleft on the left. She is using silver alginate with topical antibiotics underneath. The topical antibiotics relate to a deep tissue culture I did some weeks ago. This showed a lawn of low titer bacteria. I am not sure how much she is offloading this area both at dialysis and during the day at  the facility would be the other issue 2/8; continued nice improvement the wound is smaller. This is on the gluteal cleft on the left. She is using topical antibiotics/triple antibiotic with silver alginate. 07/08/2021: The wound in her perineum continues to improve. Daily dressing changes with triple antibiotic and silver alginate are being employed. There is a small amount of slough at the base. A new problem today is that somehow she suffered a traumatic  laceration to her left fourth toe. It is bleeding briskly in clinic. She is insensate in that area and has no idea how the injury occurred. 07/14/2021: The perineal wound looks a bit better, but unfortunately, the sacral wound that was just cranial to this has reopened. It is stage II with minimal debris and slough. The traumatic laceration to her left fourth toe has a gray thick slough. She denies any fevers, chills, or other concerns for systemic infection. 07/24/2021: Both the perineal and sacral wounds appear roughly the same today with minimal slough. The traumatic laceration to her left fourth toe looks a bit worse with necrosis of muscle and exposed tendon. No fevers or chills or any concern for systemic infection. 07/31/2021: Both perineal and sacral wounds are perhaps a little bit smaller and are very clean today. The traumatic laceration to her left fourth toe is markedly improved. No necrotic tissue was appreciated. She apparently had her x-ray performed at [facility] but it was not forwarded to [place] for review of the results. It appears that her infectious disease doctor has also referred her to podiatry because of the toe. 08/14/2021: The perineal wound is about the same size, but the sacral wound has contracted quite substantially. The traumatic laceration to her left fourth toe has made the most dramatic improvement. Minimal slough and the wound is closing up. We have been using Santyl and Hydrofera Blue on the toe and silver alginate to both the sacral and perineal wounds. 08/26/2021: The wound on her toe is nearly closed. The perineal wound is stable and the sacral wound is smaller. No concern for infection. READMISSION 11/25/2021 After our last visit, the patient left her skilled nursing facility. She has not been seen or received any significant wound care since then. Her toe is healed and her perineal wound has also epithelialized. She just has a small stage II sacral ulcer. The surface  has slough accumulation and there is some heaped up senescent skin around the borders. 12/16/2021: The ulcer is smaller today with just 2 1-2 mm or so sized openings. There is eschar and some senescent skin around the borders. Electronic Signature(s) Signed: 12/16/2021 2:25:28 PM By: Fredirick Maudlin MD FACS Entered By: Fredirick Maudlin on 12/16/2021 14:25:27 -------------------------------------------------------------------------------- Physical Exam Details Patient Name: Date of Service: [name] Barnett, [name] Barnett 12/16/2021 2:45 PM Medical Record Number: [id number] Patient Account Number: [id number] Date of Birth/Sex: Treating RN: [birth year] (41 y.o. Harlow Ohms Primary Care Provider: Julianne Handler Other Clinician: Referring Provider: Treating Provider/Extender: Eben Burow, Sharmin Weeks in Treatment: 3 Constitutional Slightly hypertensive. . . . No acute distress.Marland Kitchen Respiratory Normal work of breathing on room air.. Notes 12/16/2021: The ulcer is smaller today with just 2 1-2 mm or so sized openings. There is eschar and some senescent skin around the borders. Electronic Signature(s) Signed: 12/16/2021 2:26:42 PM By: Fredirick Maudlin MD FACS Entered By: Fredirick Maudlin on 12/16/2021 14:26:42 -------------------------------------------------------------------------------- Physician Orders Details Patient Name: Date of Service: [name] Barnett, [name] Barnett 12/16/2021 2:45 PM Medical Record Number: [id number] Patient Account Number: [id number]  Date of Birth/Sex: Treating RN: 11/21/1980 (41 y.o. America Brown Primary Care Provider: Julianne Handler Other Clinician: Referring Provider: Treating Provider/Extender: Eben Burow, Sharmin Weeks in Treatment: 3 Verbal / Phone Orders: No Diagnosis Coding ICD-10 Coding Code Description L89.152 Pressure ulcer of sacral region, stage 2 E11.622 Type 2 diabetes mellitus with other skin  ulcer N18.6 End stage renal disease Z99.2 Dependence on renal dialysis Follow-up Appointments Return appointment in 3 weeks. - Dr. Celine Ahr - Room 3 Wednesday August 30th at 12:30pm Bathing/ Shower/ Hygiene May shower with protection but do not get wound dressing(s) wet. Off-Loading Turn and reposition every 2 hours [place] wound care orders this week; continue Home Health for wound care. May utilize formulary equivalent dressing for wound treatment orders unless otherwise specified. Dressing changes to be completed by [employer] on Monday / Wednesday / Friday except when patient has scheduled visit at [facility]. Other Home Health Orders/Instructions: - [facility]ound [facility] Wound #8 - Sacrum Cleanser: Wound Cleanser 1 x Per Day/30 Days Discharge Instructions: Cleanse the wound with wound cleanser prior to applying a clean dressing using gauze sponges, not tissue or cotton balls. Peri-Wound Care: Skin Prep 1 x Per Day/30 Days Discharge Instructions: Use skin prep as directed Prim Dressing: KerraCel Ag Gelling Fiber Dressing, 2x2 in (silver alginate) 1 x Per Day/30 Days ary Discharge Instructions: Apply silver alginate to wound bed as instructed Secondary Dressing: ALLEVYN Gentle Border, 3x3 (in/in) 1 x Per Day/30 Days Discharge Instructions: Apply over primary dressing as directed. Secured With: 28M Medipore H Soft Cloth Surgical T ape, 4 x 10 (in/yd) (DME) (Generic) 1 x Per Day/30 Days Discharge Instructions: Secure with tape as directed. Electronic Signature(s) Signed: 12/16/2021 2:43:32 PM By: Fredirick Maudlin MD FACS Signed: 12/16/2021 4:13:50 PM By: Sabas Sous By: Adline Peals on 12/16/2021 14:33:10 -------------------------------------------------------------------------------- Problem List Details Patient Name: Date of Service: [name] Barnett, [name] Barnett 12/16/2021 2:45 PM Medical Record Number: [id number] Patient Account Number:  [id number] Date of Birth/Sex: Treating RN: 05-Dec-1980 (41 y.o. Harlow Ohms Primary Care Provider: Julianne Handler Other Clinician: Referring Provider: Treating Provider/Extender: Eben Burow, Sharmin Weeks in Treatment: 3 Active Problems ICD-10 Encounter Code Description Active Date MDM Diagnosis L89.152 Pressure ulcer of sacral region, stage 2 11/25/2021 No Yes E11.622 Type 2 diabetes mellitus with other skin ulcer 11/25/2021 No Yes N18.6 End stage renal disease 11/25/2021 No Yes Z99.2 Dependence on renal dialysis 11/25/2021 No Yes Inactive Problems Resolved Problems Electronic Signature(s) Signed: 12/16/2021 2:24:23 PM By: Fredirick Maudlin MD FACS Entered By: Fredirick Maudlin on 12/16/2021 14:24:23 -------------------------------------------------------------------------------- Progress Note Details Patient Name: Date of Service: [name] Barnett, [name] Barnett 12/16/2021 2:45 PM Medical Record Number: [id number] Patient Account Number: [id number] Date of Birth/Sex: Treating RN: 1980/08/31 (41 y.o. Harlow Ohms Primary Care Provider: Julianne Handler Other Clinician: Referring Provider: Treating Provider/Extender: Eben Burow, Sharmin Weeks in Treatment: 3 Subjective Chief Complaint Information obtained from Patient 06/15/2019; patient is here for review of the wound on her lower sacrum 12/31/2020; patient is here for review of the wound on her right buttock 11/25/2021: Patient is here for evaluation and management of a stage II sacral pressure ulcer. History of Present Illness (HPI) Admission 06/15/2019 This is a 41 year old woman who comes to [place] from [facility] skilled facility in [facility]. I am able to follow a lot of her history on care everywhere in epic. The patient is a type II diabetic and has been on dialysis. In June 2020 she  suffered a right CVA with left hemiparesis and some degree of language  disturbance. I am not exactly sure of the nature of the stroke at this present time. She has made a decent recovery and is now up walking with a walker. In August 2020 she was admitted to [facility] in [place] with L4-L5 discitis. Culture and sensitivity showed Klebsiella. She was discharged on 8 weeks of ceftazidime. In mid September she is readmitted to the hospital with a pressure ulcer on her lower sacrum and necrotizing fasciitis. She required an extensive surgical debridement. CT scan of the pelvis at 1 point showed communication with the rectum. A colostomy was recommended and placed. Culture of this area showed Klebsiella and yeast. She was discharged on an extended course of meropenem and Diflucan. [facility] [place] We do not have any of these records. I think she was discharged to another nursing home but is come to [facility] skilled facility in [facility]. She was sent down here for review of the remaining wound. By review of the pictures that the patient had from September this is made considerable improvement. She tells me that the wound VAC was discontinued at the end of December 2020. Currently the facility is using a form of silver alginate to the wound surface. Past medical history type 2 diabetes with chronic renal failure on dialysis Tuesday Thursday and Saturday. CVA with left hemiparesis in June 2020 but she appears to be making a good recovery. Chronic diastolic heart failure, hypertension 2/19; patient's wound area slightly smaller. The area that was towards her anus has closed over. We are using Santyl covered with Hydrofera Blue. We have not been able to get her lab work apparently her veins are very difficult for phlebotomy. They are trying to get the blood work done where she dialyzes in [facility] but that requires some logistical issues. 2/26; not much change in the wound. Still a  nonviable surface we have been using Santyl and Hydrofera Blue. I changed her to [facility] only. She tells me she has rigorously offloading this area she also complains of some pain 3/5; still no change here. I switch to pure Hydrofera Blue. She still complains of a lot of pain although is a big wound it seems out of proportion to what I might expect. This was initially a surgical wound secondary to necrotizing wound infection. She is on dialysis. Other than that she thinks she is offloading this fairly rigorously. She has not been systemically unwell 3/26; I switched to Iodoflex 3 weeks ago. This really seems to have helped, much better surface and slight improvement in the surface area. 4/26; the patient is still on Iodoflex she missed an appointment 2 weeks ago. Doing slightly smaller. She is on dialysis 5/10; using Hydrofera Blue. The major improvement here is the complete lack of depth. She has islands of epithelialization 09/24/19-Patient comes in at 2 weeks, dimensions about the same, there is an [place] of epithelium at margin of wound, no slough noted 10/15/2019 upon evaluation today patient appears to be doing well with regard to her wound. She has been tolerating the dressing changes in the sacral region without complication and at this point she seems to be healing quite nicely. I am extremely pleased with how things are progressing and the epithelial tissue seems to be spreading from the central portion as well which is also excellent. 7/12; the patient still has 2 areas of this  originally large stage IV wound in the lower sacrum/coccyx. We have been using Hydrofera Blue. The smaller area which has depth at 0.7 cm there is a question about whether they have been packing anything into this or just flaring Hydrofera Blue over the top 9/23; its been more than 2 months since this patient was here. She still has a small area distally which is the remanent of her stage IV wound. They have  been using Hydrofera Blue. She also has an area on the left gluteal 10/22; 1 month follow-up. I think the original sacral wound is closed however the patient has 2 areas distally 1 over the coccyx a small pinpoint area and the other into the gluteal cleft. Neither 1 of these looks particularly infected. I thought they might be connected but they were not. 11/15; the original sacral wound is closed patient only has 1 remaining area. 1 of these is in the gluteal cleft just below the coccyx. The area over the coccyx from last time I think is all so closed 12/31/2020; this is a patient that we saw for a pressure ulcer over her lower sacrum and coccyx for a long period of time in 2021. She eventually healed over. She is at [facility] skilled facility largely secondary to a remote CVA she had. She is also end-stage renal disease on dialysis secondary to type 2 diabetes. She tells [place] that since about mid June she has had an area more distally than the original sacrum/coccyx wound it looks as though they are putting silver alginate in this area. She is limited in her ability to offload this I think spending most of her time in a wheelchair although she can stand to transfer. She has an ostomy During her original stay here in 2021 I also list that she had a more distal area into her perineum that closed over well before the more proximal coccyx/sacral wound. She had a CT scan of the abdomen and pelvis on 12/01/2020. This did note first chronic sacral decubitus along the right aspect of her distal sacrum and coccyx. There was not felt to be bone destruction she has chronic changes of bilateral sacroiliitis and chronic endplate destruction and sclerosis at L4-L5 secondary to previous osteomyelitis however no new findings were felt to be seen in terms of bone problems in this area 01/14/2021; 2-week follow-up. Her wound is smaller essentially in the coccyx area. We have been using silver collagen. 10/5; 3-week  follow-up. The patient returns with her area in the coccyx healed however distally in the perineum there is a fairly long stage II area. I am assuming this patient's wound is a pressure related area. Although this would not be a usual pressure area for a normal situation her immobility secondary to remote CVA, dialysis etc. make her at risk for pressure areas in an otherwise unusual setting. The patient is fairly upset stating that we were told about this last time. I do not have this in my records. Her original wound was small and just about healed at that point and this visit was arranged in follow-up. If there was a new wound more distally into her perineum we did not look at it 11/2; patient's wound is slightly smaller. This is in the gluteal cleft on the left. Using silver alginate. She tells me she is aggressively offloading this area. 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 We have been using silver alginate. Arrives in clinic today with the wound larger. 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 We have been using silver alginate. PCR culture I did last week was polymicrobial with multiple organisms we put Polysporin under the silver alginate at the facility. According to the patient this got started 05/13/2021; the wound looks slightly smaller. This is in the gluteal cleft on the left. She is using silver alginate with topical antibiotics underneath. The topical antibiotics relate to a deep tissue culture I did some weeks ago. This showed a lawn of low titer bacteria. I am not sure how much she is offloading this area both at dialysis and during the day at the facility would be the other issue 2/8; continued nice improvement the wound is  smaller. This is on the gluteal cleft on the left. She is using topical antibiotics/triple antibiotic with silver alginate. 07/08/2021: The wound in her perineum continues to improve. Daily dressing changes with triple antibiotic and silver alginate are being employed. There is a small amount of slough at the base. A new problem today is that somehow she suffered a traumatic laceration to her left fourth toe. It is bleeding briskly in clinic. She is insensate in that area and has no idea how the injury occurred. 07/14/2021: The perineal wound looks a bit better, but unfortunately, the sacral wound that was just cranial to this has reopened. It is stage II with minimal debris and slough. The traumatic laceration to her left fourth toe has a gray thick slough. She denies any fevers, chills, or other concerns for systemic infection. 07/24/2021: Both the perineal and sacral wounds appear roughly the same today with minimal slough. The traumatic laceration to her left fourth toe looks a bit worse with necrosis of muscle and exposed tendon. No fevers or chills or any concern for systemic infection. 07/31/2021: Both perineal and sacral wounds are perhaps a little bit smaller and are very clean today. The traumatic laceration to her left fourth toe is markedly improved. No necrotic tissue was appreciated. She apparently had her x-ray performed at [facility] but it was not forwarded to [place] for review of the results. It appears that her infectious disease doctor has also referred her to podiatry because of the toe. 08/14/2021: The perineal wound is about the same size, but the sacral wound has contracted quite substantially. The traumatic laceration to her left fourth toe has made the most dramatic improvement. Minimal slough and the wound is closing up. We have been using Santyl and Hydrofera Blue on the toe and silver alginate to both the sacral and perineal wounds. 08/26/2021: The wound on her toe is nearly closed.  The perineal wound is stable and the sacral wound is smaller. No concern for infection. READMISSION 11/25/2021 After our last visit, the patient left her skilled nursing facility. She has not been seen or received any significant wound care since then. Her toe is healed and her perineal wound has also epithelialized. She just has a small stage II sacral ulcer. The surface has slough accumulation and there is some heaped up senescent skin around the borders. 12/16/2021: The ulcer is smaller today with just 2 1-2 mm or so sized openings. There is eschar and some senescent skin around the borders. Patient History Information obtained from Patient, Chart. Family History Diabetes -  Maternal Grandparents, Heart Disease - Mother,Father, Hypertension - Maternal Grandparents, Kidney Disease - Maternal Grandparents, Lung Disease - Mother, Stroke - Mother,Father, Thyroid Problems - Mother,Siblings, No family history of Cancer, Hereditary Spherocytosis, Seizures, Tuberculosis. Social History Never smoker, Marital Status - Separated, Alcohol Use - Never, Drug Use - No History, Caffeine Use - Daily - coffee. Medical History Eyes Patient has history of Cataracts, Glaucoma Ear/Nose/Mouth/Throat Denies history of Chronic sinus problems/congestion, Middle ear problems Hematologic/Lymphatic Patient has history of Anemia Cardiovascular Patient has history of Congestive Heart Failure, Hypertension Denies history of Angina, Arrhythmia, Coronary Artery Disease, Deep Vein Thrombosis, Hypotension, Myocardial Infarction, Peripheral Arterial Disease, Peripheral Venous Disease, Phlebitis, Vasculitis Endocrine Patient has history of Type II Diabetes Genitourinary Patient has history of End Stage Renal Disease - HD Integumentary (Skin) Denies history of History of Burn Musculoskeletal Denies history of Gout, Rheumatoid Arthritis, Osteoarthritis, Osteomyelitis Neurologic Patient has history of Neuropathy Denies  history of Dementia, Quadriplegia, Paraplegia, Seizure Disorder Oncologic Denies history of Received Chemotherapy, Received Radiation Psychiatric Patient has history of Confinement Anxiety Denies history of Anorexia/bulimia Hospitalization/Surgery History - right arm AV fistula. - cardiac cath with stents. - sacral wound debridement. - left knee surgery. - catract extraction. - colostomy. Medical A Surgical History Notes nd Cardiovascular hyperlipidemia Gastrointestinal GERD, diverting colostomy Musculoskeletal left side weakness Neurologic CVA left mild hemiplegia, lumbar discitis Psychiatric depression, anxiety Objective Constitutional Slightly hypertensive. No acute distress.. Vitals Time Taken: 2:01 PM, Height: 65 in, Weight: 170 lbs, BMI: 28.3, Temperature: 98.7 F, Pulse: 80 bpm, Respiratory Rate: 16 breaths/min, Blood Pressure: 145/88 mmHg. Respiratory Normal work of breathing on room air.. General Notes: 12/16/2021: The ulcer is smaller today with just 2 1-2 mm or so sized openings. There is eschar and some senescent skin around the borders. Integumentary (Hair, Skin) Wound #8 status is Open. Original cause of wound was Pressure Injury. The date acquired was: 07/15/2021. The wound has been in treatment 22 weeks. The wound is located on the Sacrum. The wound measures 0.3cm length x 0.2cm width x 0.1cm depth; 0.047cm^2 area and 0.005cm^3 volume. There is Fat Layer (Subcutaneous Tissue) exposed. There is no tunneling or undermining noted. There is a medium amount of serosanguineous drainage noted. The wound margin is distinct with the outline attached to the wound base. There is small (1-33%) pink granulation within the wound bed. There is a large (67-100%) amount of necrotic tissue within the wound bed including Eschar and Adherent Slough. Assessment Active Problems ICD-10 Pressure ulcer of sacral region, stage 2 Type 2 diabetes mellitus with other skin ulcer End stage  renal disease Dependence on renal dialysis Procedures Wound #8 Pre-procedure diagnosis of Wound #8 is a Pressure Ulcer located on the Sacrum . There was a Selective/Open Wound Non-Viable Tissue Debridement with a total area of 0.06 sq cm performed by Fredirick Maudlin, MD. With the following instrument(s): Curette to remove Non-Viable tissue/material. Material removed includes Eschar and Other: dead skin after achieving pain control using Lidocaine 5% topical ointment. No specimens were taken. A time out was conducted at 14:12, prior to the start of the procedure. A Minimum amount of bleeding was controlled with Pressure. The procedure was tolerated well with a pain level of 0 throughout and a pain level of 0 following the procedure. Post Debridement Measurements: 0.3cm length x 0.2cm width x 0.1cm depth; 0.005cm^3 volume. Post debridement Stage noted as Category/Stage II. Character of Wound/Ulcer Post Debridement is improved. Post procedure Diagnosis Wound #8: Same as Pre-Procedure Plan Follow-up Appointments: Return appointment in  3 weeks. - Dr. Celine Ahr - Room 3 Wednesday August 30th at 12:30pm Bathing/ Shower/ Hygiene: May shower with protection but do not get wound dressing(s) wet. Off-Loading: Turn and reposition every 2 hours Home Health: New wound care orders this week; continue Home Health for wound care. May utilize formulary equivalent dressing for wound treatment orders unless otherwise specified. Dressing changes to be completed by [place] on Monday / Wednesday / Friday except when patient has scheduled visit at [facility]. Other Home Health Orders/Instructions: - [facility] WOUND #8: - Sacrum Wound Laterality: Cleanser: Wound Cleanser 1 x Per Day/30 Days Discharge Instructions: Cleanse the wound with wound cleanser prior to applying a clean dressing using gauze sponges, not tissue or cotton balls. Peri-Wound Care: Skin Prep 1 x Per Day/30 Days Discharge Instructions:  Use skin prep as directed Prim Dressing: KerraCel Ag Gelling Fiber Dressing, 2x2 in (silver alginate) 1 x Per Day/30 Days ary Discharge Instructions: Apply silver alginate to wound bed as instructed Secondary Dressing: ALLEVYN Gentle Border, 3x3 (in/in) 1 x Per Day/30 Days Discharge Instructions: Apply over primary dressing as directed. 12/16/2021: The ulcer is smaller today with just 2 1-2 mm or so sized openings. There is eschar and some senescent skin around the borders. I used a curette to debride eschar and senescent skin from the wound. We will continue using silver alginate with a foam border dressing. Follow-up in 2 weeks. Electronic Signature(s) Signed: 12/16/2021 2:27:48 PM By: Fredirick Maudlin MD FACS Entered By: Fredirick Maudlin on 12/16/2021 14:27:48 -------------------------------------------------------------------------------- HxROS Details Patient Name: Date of Service: [name] DA CESILIA, SHINN 12/16/2021 2:45 PM Medical Record Number: [id number] Patient Account Number: [id number] Date of Birth/Sex: Treating RN: [birth year] (41 y.o. Harlow Ohms Primary Care Provider: Julianne Handler Other Clinician: Referring Provider: Treating Provider/Extender: Eben Burow, Sharmin Weeks in Treatment: 3 Information Obtained From Patient Chart Eyes Medical History: Positive for: Cataracts; Glaucoma Ear/Nose/Mouth/Throat Medical History: Negative for: Chronic sinus problems/congestion; Middle ear problems Hematologic/Lymphatic Medical History: Positive for: Anemia Cardiovascular Medical History: Positive for: Congestive Heart Failure; Hypertension Negative for: Angina; Arrhythmia; Coronary Artery Disease; Deep Vein Thrombosis; Hypotension; Myocardial Infarction; Peripheral Arterial Disease; Peripheral Venous Disease; Phlebitis; Vasculitis Past Medical History Notes: hyperlipidemia Gastrointestinal Medical History: Past Medical History  Notes: GERD, diverting colostomy Endocrine Medical History: Positive for: Type II Diabetes Time with diabetes: 9 yrs Treated with: Insulin Blood sugar tested every day: Yes Tested : 3 times per day Genitourinary Medical History: Positive for: End Stage Renal Disease - HD Integumentary (Skin) Medical History: Negative for: History of Burn Musculoskeletal Medical History: Negative for: Gout; Rheumatoid Arthritis; Osteoarthritis; Osteomyelitis Past Medical History Notes: left side weakness Neurologic Medical History: Positive for: Neuropathy Negative for: Dementia; Quadriplegia; Paraplegia; Seizure Disorder Past Medical History Notes: CVA left mild hemiplegia, lumbar discitis Oncologic Medical History: Negative for: Received Chemotherapy; Received Radiation Psychiatric Medical History: Positive for: Confinement Anxiety Negative for: Anorexia/bulimia Past Medical History Notes: depression, anxiety HBO Extended History Items Eyes: Eyes: Cataracts Glaucoma Immunizations Pneumococcal Vaccine: Received Pneumococcal Vaccination: Yes Received Pneumococcal Vaccination On or After 60th Birthday: No Implantable Devices Yes Hospitalization / Surgery History Type of Hospitalization/Surgery right arm AV fistula cardiac cath with stents sacral wound debridement left knee surgery catract extraction colostomy Family and Social History Cancer: No; Diabetes: Yes - Maternal Grandparents; Heart Disease: Yes - Mother,Father; Hereditary Spherocytosis: No; Hypertension: Yes - Maternal Grandparents; Kidney Disease: Yes - Maternal Grandparents; Lung Disease: Yes - Mother; Seizures: No; Stroke: Yes - Mother,Father; Thyroid Problems: Yes - Mother,Siblings; Tuberculosis:  No; Never smoker; Marital Status - Separated; Alcohol Use: Never; Drug Use: No History; Caffeine Use: Daily - coffee; Financial Concerns: No; Food, Clothing or Shelter Needs: No; Support System Lacking: No; Transportation  Concerns: No Electronic Signature(s) Signed: 12/16/2021 2:43:32 PM By: Fredirick Maudlin MD FACS Signed: 12/16/2021 4:13:50 PM By: Adline Peals Entered By: Fredirick Maudlin on 12/16/2021 14:26:17 -------------------------------------------------------------------------------- [facility] Details Patient Name: Date of Service: [name] DA ARIANNY, PUN 12/16/2021 Medical Record Number: [id number] Patient Account Number: [id number] Date of Birth/Sex: Treating RN: 1981-04-24 (41 y.o. Harlow Ohms Primary Care Provider: Julianne Handler Other Clinician: Referring Provider: Treating Provider/Extender: Eben Burow, Sharmin Weeks in Treatment: 3 Diagnosis Coding ICD-10 Codes Code Description [phone] Pressure ulcer of sacral region, stage 2 E11.622 Type 2 diabetes mellitus with other skin ulcer N18.6 End stage renal disease Z99.2 Dependence on renal dialysis Facility Procedures CPT4 Code: [id number] Description: 02725 - DEBRIDE WOUND 1ST 20 SQ CM OR < ICD-10 Diagnosis Description L89.152 Pressure ulcer of sacral region, stage 2 Modifier: Quantity: 1 Physician Procedures : CPT4 Code Description Modifier [id number] - WC PHYS LEVEL 2 - EST PT 25 ICD-10 Diagnosis Description L89.152 Pressure ulcer of sacral region, stage 2 E11.622 Type 2 diabetes mellitus with other skin ulcer N18.6 End stage renal disease Z99.2  Dependence on renal dialysis Quantity: 1 : [id number] - WC PHYS DEBR WO ANESTH 20 SQ CM 1 ICD-10 Diagnosis Description L89.152 Pressure ulcer of sacral region, stage 2 Quantity: Electronic Signature(s) Signed: 12/16/2021 2:28:57 PM By: Fredirick Maudlin MD FACS Entered By: Fredirick Maudlin on 12/16/2021 14:28:57

## 2021-12-17 NOTE — Progress Notes (Signed)
[name] Barnett, [name] Barnett ([id number]) Visit Report for 12/16/2021 Arrival Information Details Patient Name: Date of Service: [name] Barnett, [name] Barnett 12/16/2021 2:45 PM Medical Record Number: [id number] Patient Account Number: [id number] Date of Birth/Sex: Treating RN: 1980-08-29 (41 y.o. America Brown Primary Care Voyd Groft: Julianne Handler Other Clinician: Referring Ebon Ketchum: Treating Angala Hilgers/Extender: Eben Burow, Sharmin Weeks in Treatment: 3 Visit Information History Since Last Visit Added or deleted any medications: No Patient Arrived: Wheel Chair Any new allergies or adverse reactions: No Arrival Time: 13:55 Had a fall or experienced change in No Accompanied By: self activities of daily living that may affect Transfer Assistance: None risk of falls: Patient Identification Verified: Yes Signs or symptoms of abuse/neglect since last visito No Hospitalized since last visit: No Implantable device outside of the clinic excluding No cellular tissue based products placed in the center since last visit: Has Dressing in Place as Prescribed: Yes Pain Present Now: Yes Electronic Signature(s) Signed: 12/16/2021 3:42:30 PM By: Dellie Catholic RN Entered By: Dellie Catholic on 12/16/2021 14:00:55 -------------------------------------------------------------------------------- Encounter Discharge Information Details Patient Name: Date of Service: [name] Barnett, [name] Barnett 12/16/2021 2:45 PM Medical Record Number: [id number] Patient Account Number: [id number] Date of Birth/Sex: Treating RN: 01-Jul-1980 (41 y.o. America Brown Primary Care Oron Westrup: Julianne Handler Other Clinician: Referring Layten Aiken: Treating Adlai Nieblas/Extender: Eben Burow, Sharmin Weeks in Treatment: 3 Encounter Discharge Information Items Post Procedure Vitals Discharge Condition: Stable Temperature (F): 98.7 Ambulatory Status: Wheelchair Pulse (bpm): 80 Discharge  Destination: Home Respiratory Rate (breaths/min): 16 Transportation: Private Auto Blood Pressure (mmHg): 145/88 Accompanied By: private transport Schedule Follow-up Appointment: Yes Clinical Summary of Care: Patient Declined Electronic Signature(s) Signed: 12/16/2021 3:42:30 PM By: Dellie Catholic RN Entered By: Dellie Catholic on 12/16/2021 15:42:06 -------------------------------------------------------------------------------- Lower Extremity Assessment Details Patient Name: Date of Service: [name] Barnett, [name] Barnett 12/16/2021 2:45 PM Medical Record Number: [id number] Patient Account Number: [id number] Date of Birth/Sex: Treating RN: 1980-10-11 (41 y.o. America Brown Primary Care Samah Lapiana: Julianne Handler Other Clinician: Referring Yvanna Vidas: Treating Clyde Upshaw/Extender: Eben Burow, Sharmin Weeks in Treatment: 3 Electronic Signature(s) Signed: 12/16/2021 3:42:30 PM By: Dellie Catholic RN Entered By: Dellie Catholic on 12/16/2021 14:02:18 -------------------------------------------------------------------------------- Multi Wound Chart Details Patient Name: Date of Service: [name] Barnett, [name] Barnett 12/16/2021 2:45 PM Medical Record Number: [id number] Patient Account Number: [id number] Date of Birth/Sex: Treating RN: Apr 29, 1981 (41 y.o. Harlow Ohms Primary Care Ashaki Frosch: Julianne Handler Other Clinician: Referring Lakesia Dahle: Treating Nyzier Boivin/Extender: Eben Burow, Sharmin Weeks in Treatment: 3 Vital Signs Height(in): 65 Pulse(bpm): 80 Weight(lbs): 170 Blood Pressure(mmHg): 145/88 Body Mass Index(BMI): 28.3 Temperature(F): 98.7 Respiratory Rate(breaths/min): 16 Photos: [N/A:N/A] Sacrum N/A N/A Wound Location: Pressure Injury N/A N/A Wounding Event: Pressure Ulcer N/A N/A Primary Etiology: Cataracts, Glaucoma, Anemia, N/A N/A Comorbid History: Congestive Heart Failure, Hypertension, Type II Diabetes,  End Stage Renal Disease, Neuropathy, Confinement Anxiety 07/15/2021 N/A N/A Date Acquired: 22 N/A N/A Weeks of Treatment: Open N/A N/A Wound Status: No N/A N/A Wound Recurrence: 0.3x0.2x0.1 N/A N/A Measurements L x W x D (cm) 0.047 N/A N/A A (cm) : rea 0.005 N/A N/A Volume (cm) : 88.90% N/A N/A % Reduction in A rea: 94.10% N/A N/A % Reduction in Volume: Category/Stage II N/A N/A Classification: Medium N/A N/A Exudate A mount: Serosanguineous N/A N/A Exudate Type: red, brown N/A N/A Exudate Color: Distinct, outline attached N/A N/A Wound Margin: Small (1-33%) N/A N/A Granulation Amount: Pink N/A N/A Granulation Quality: Large (67-100%) N/A N/A Necrotic Amount: Eschar, Adherent Slough N/A N/A Necrotic Tissue: Fat Layer (Subcutaneous Tissue):  Yes N/A N/A Exposed Structures: Fascia: No Tendon: No Muscle: No Joint: No Bone: No Large (67-100%) N/A N/A Epithelialization: Debridement - Selective/Open Wound N/A N/A Debridement: Pre-procedure Verification/Time Out 14:12 N/A N/A Taken: Lidocaine 5% topical ointment N/A N/A Pain Control: Necrotic/Eschar, Other N/A N/A Tissue Debrided: Non-Viable Tissue N/A N/A Level: 0.06 N/A N/A Debridement A (sq cm): rea Curette N/A N/A Instrument: Minimum N/A N/A Bleeding: Pressure N/A N/A Hemostasis A chieved: 0 N/A N/A Procedural Pain: 0 N/A N/A Post Procedural Pain: Procedure was tolerated well N/A N/A Debridement Treatment Response: 0.3x0.2x0.1 N/A N/A Post Debridement Measurements L x W x D (cm) 0.005 N/A N/A Post Debridement Volume: (cm) Category/Stage II N/A N/A Post Debridement Stage: Debridement N/A N/A Procedures Performed: Treatment Notes Electronic Signature(s) Signed: 12/16/2021 2:24:34 PM By: Fredirick Maudlin MD FACS Signed: 12/16/2021 4:13:50 PM By: Adline Peals Entered By: Fredirick Maudlin on 12/16/2021  14:24:34 -------------------------------------------------------------------------------- Multi-Disciplinary Care Plan Details Patient Name: Date of Service: [name] Barnett, [name] Barnett 12/16/2021 2:45 PM Medical Record Number: [id number] Patient Account Number: [id number] Date of Birth/Sex: Treating RN: 01/12/1981 (41 y.o. America Brown Primary Care Chevelle Coulson: Julianne Handler Other Clinician: Referring Twylla Arceneaux: Treating Chaley Castellanos/Extender: Eben Burow, Sharmin Weeks in Treatment: 3 Active Inactive Wound/Skin Impairment Nursing Diagnoses: Impaired tissue integrity Knowledge deficit related to ulceration/compromised skin integrity Goals: Patient/caregiver will verbalize understanding of skin care regimen Date Initiated: 11/25/2021 Target Resolution Date: 01/08/2022 Goal Status: Active Ulcer/skin breakdown will have a volume reduction of 30% by week 4 Date Initiated: 11/25/2021 Target Resolution Date: 01/08/2022 Goal Status: Active Interventions: Assess patient/caregiver ability to obtain necessary supplies Assess ulceration(s) every visit Treatment Activities: Skin care regimen initiated : 11/25/2021 Topical wound management initiated : 11/25/2021 Notes: Electronic Signature(s) Signed: 12/16/2021 3:42:30 PM By: Dellie Catholic RN Entered By: Dellie Catholic on 12/16/2021 14:17:14 -------------------------------------------------------------------------------- Pain Assessment Details Patient Name: Date of Service: [name] Barnett, [name] Barnett 12/16/2021 2:45 PM Medical Record Number: [id number] Patient Account Number: [id number] Date of Birth/Sex: Treating RN: [birth year] (41 y.o. America Brown Primary Care Remedy Corporan: Julianne Handler Other Clinician: Referring Demaya Hardge: Treating Kaito Schulenburg/Extender: Eben Burow, Sharmin Weeks in Treatment: 3 Active Problems Location of Pain Severity and Description of Pain Patient Has Paino Yes Site  Locations Pain Location: Generalized Pain With Dressing Change: Yes Duration of the Pain. Constant / Intermittento Constant Rate the pain. Current Pain Level: 5 Worst Pain Level: 9 Least Pain Level: 5 Tolerable Pain Level: 5 Character of Pain Describe the Pain: Difficult to Pinpoint Pain Management and Medication Current Pain Management: Medication: Yes Cold Application: No Rest: Yes Massage: No Activity: No T.E.N.S.: No Heat Application: No Leg drop or elevation: No Is the Current Pain Management Adequate: Adequate How does your wound impact your activities of daily livingo Sleep: No Bathing: No Appetite: No Relationship With Others: No Bladder Continence: No Emotions: No Bowel Continence: No Work: No Toileting: No Drive: No Dressing: No Hobbies: No Electronic Signature(s) Signed: 12/16/2021 3:42:30 PM By: Dellie Catholic RN Entered By: Dellie Catholic on 12/16/2021 14:02:09 -------------------------------------------------------------------------------- Patient/Caregiver Education Details Patient Name: Date of Service: [name] Barnett 8/16/2023andnbsp2:45 PM Medical Record Number: [id number] Patient Account Number: [id number] Date of Birth/Gender: Treating RN: January 17, 1981 (41 y.o. America Brown Primary Care Physician: Julianne Handler Other Clinician: Referring Physician: Treating Physician/Extender: Nelma Rothman in Treatment: 3 Education Assessment Education Provided To: Patient Education Topics Provided Wound/Skin Impairment: Methods: Explain/Verbal Responses: Return demonstration correctly Electronic Signature(s) Signed: 12/16/2021 3:42:30 PM By: Dellie Catholic RN Entered By: Dellie Catholic on  12/16/2021 14:17:42 -------------------------------------------------------------------------------- Wound Assessment Details Patient Name: Date of Service: [name] Barnett, [name] Barnett 12/16/2021 2:45 PM Medical  Record Number: [id number] Patient Account Number: [id number] Date of Birth/Sex: Treating RN: 03-01-1981 (41 y.o. America Brown Primary Care Merrin Mcvicker: Julianne Handler Other Clinician: Referring Lenny Fiumara: Treating Karter Haire/Extender: Eben Burow, Sharmin Weeks in Treatment: 3 Wound Status Wound Number: 8 Primary Pressure Ulcer Etiology: Wound Location: Sacrum Wound Open Wounding Event: Pressure Injury Status: Date Acquired: 07/15/2021 Comorbid Cataracts, Glaucoma, Anemia, Congestive Heart Failure, Weeks Of Treatment: 22 History: Hypertension, Type II Diabetes, End Stage Renal Disease, Clustered Wound: No Neuropathy, Confinement Anxiety Photos Wound Measurements Length: (cm) 0.3 Width: (cm) 0.2 Depth: (cm) 0.1 Area: (cm) 0.047 Volume: (cm) 0.005 % Reduction in Area: 88.9% % Reduction in Volume: 94.1% Epithelialization: Large (67-100%) Tunneling: No Undermining: No Wound Description Classification: Category/Stage II Wound Margin: Distinct, outline attached Exudate Amount: Medium Exudate Type: Serosanguineous Exudate Color: red, brown Foul Odor After Cleansing: No Slough/Fibrino Yes Wound Bed Granulation Amount: Small (1-33%) Exposed Structure Granulation Quality: Pink Fascia Exposed: No Necrotic Amount: Large (67-100%) Fat Layer (Subcutaneous Tissue) Exposed: Yes Necrotic Quality: Eschar, Adherent Slough Tendon Exposed: No Muscle Exposed: No Joint Exposed: No Bone Exposed: No Treatment Notes Wound #8 (Sacrum) Cleanser Wound Cleanser Discharge Instruction: Cleanse the wound with wound cleanser prior to applying a clean dressing using gauze sponges, not tissue or cotton balls. Peri-Wound Care Skin Prep Discharge Instruction: Use skin prep as directed Topical Primary Dressing KerraCel Ag Gelling Fiber Dressing, 2x2 in (silver alginate) Discharge Instruction: Apply silver alginate to wound bed as instructed Secondary  Dressing ALLEVYN Gentle Border, 3x3 (in/in) Discharge Instruction: Apply over primary dressing as directed. Secured With 60M Medipore H Soft Cloth Surgical T ape, 4 x 10 (in/yd) Discharge Instruction: Secure with tape as directed. Compression Wrap Compression Stockings Add-Ons Electronic Signature(s) Signed: 12/16/2021 3:42:30 PM By: Dellie Catholic RN Entered By: Dellie Catholic on 12/16/2021 14:08:41 -------------------------------------------------------------------------------- Vitals Details Patient Name: Date of Service: [name] DA LOVIE, ZARLING 12/16/2021 2:45 PM Medical Record Number: [id number] Patient Account Number: [id number] Date of Birth/Sex: Treating RN: November 29, 1980 (41 y.o. America Brown Primary Care Marsa Matteo: Julianne Handler Other Clinician: Referring Jayce Kainz: Treating Heather Mckendree/Extender: Eben Burow, Sharmin Weeks in Treatment: 3 Vital Signs Time Taken: 14:01 Temperature (F): 98.7 Height (in): 65 Pulse (bpm): 80 Weight (lbs): 170 Respiratory Rate (breaths/min): 16 Body Mass Index (BMI): 28.3 Blood Pressure (mmHg): 145/88 Reference Range: 80 - 120 mg / dl Electronic Signature(s) Signed: 12/16/2021 3:42:30 PM By: Dellie Catholic RN Entered By: Dellie Catholic on 12/16/2021 14:01:34

## 2021-12-19 DIAGNOSIS — N186 End stage renal disease: Secondary | ICD-10-CM | POA: Diagnosis not present

## 2021-12-19 DIAGNOSIS — N2581 Secondary hyperparathyroidism of renal origin: Secondary | ICD-10-CM | POA: Diagnosis not present

## 2021-12-19 DIAGNOSIS — Z992 Dependence on renal dialysis: Secondary | ICD-10-CM | POA: Diagnosis not present

## 2021-12-22 DIAGNOSIS — Z992 Dependence on renal dialysis: Secondary | ICD-10-CM | POA: Diagnosis not present

## 2021-12-22 DIAGNOSIS — N2581 Secondary hyperparathyroidism of renal origin: Secondary | ICD-10-CM | POA: Diagnosis not present

## 2021-12-22 DIAGNOSIS — N186 End stage renal disease: Secondary | ICD-10-CM | POA: Diagnosis not present

## 2021-12-24 DIAGNOSIS — N186 End stage renal disease: Secondary | ICD-10-CM | POA: Diagnosis not present

## 2021-12-24 DIAGNOSIS — N2581 Secondary hyperparathyroidism of renal origin: Secondary | ICD-10-CM | POA: Diagnosis not present

## 2021-12-24 DIAGNOSIS — Z992 Dependence on renal dialysis: Secondary | ICD-10-CM | POA: Diagnosis not present

## 2021-12-26 DIAGNOSIS — N186 End stage renal disease: Secondary | ICD-10-CM | POA: Diagnosis not present

## 2021-12-26 DIAGNOSIS — Z992 Dependence on renal dialysis: Secondary | ICD-10-CM | POA: Diagnosis not present

## 2021-12-26 DIAGNOSIS — N2581 Secondary hyperparathyroidism of renal origin: Secondary | ICD-10-CM | POA: Diagnosis not present

## 2021-12-29 DIAGNOSIS — Z992 Dependence on renal dialysis: Secondary | ICD-10-CM | POA: Diagnosis not present

## 2021-12-29 DIAGNOSIS — N2581 Secondary hyperparathyroidism of renal origin: Secondary | ICD-10-CM | POA: Diagnosis not present

## 2021-12-29 DIAGNOSIS — N186 End stage renal disease: Secondary | ICD-10-CM | POA: Diagnosis not present

## 2021-12-30 ENCOUNTER — Encounter (HOSPITAL_BASED_OUTPATIENT_CLINIC_OR_DEPARTMENT_OTHER): Payer: Medicare HMO | Admitting: General Surgery

## 2021-12-30 DIAGNOSIS — E1122 Type 2 diabetes mellitus with diabetic chronic kidney disease: Secondary | ICD-10-CM | POA: Diagnosis not present

## 2021-12-30 DIAGNOSIS — N186 End stage renal disease: Secondary | ICD-10-CM | POA: Diagnosis not present

## 2021-12-30 DIAGNOSIS — I132 Hypertensive heart and chronic kidney disease with heart failure and with stage 5 chronic kidney disease, or end stage renal disease: Secondary | ICD-10-CM | POA: Diagnosis not present

## 2021-12-30 DIAGNOSIS — I69354 Hemiplegia and hemiparesis following cerebral infarction affecting left non-dominant side: Secondary | ICD-10-CM | POA: Diagnosis not present

## 2021-12-30 DIAGNOSIS — Z992 Dependence on renal dialysis: Secondary | ICD-10-CM | POA: Diagnosis not present

## 2021-12-30 DIAGNOSIS — I5032 Chronic diastolic (congestive) heart failure: Secondary | ICD-10-CM | POA: Diagnosis not present

## 2021-12-30 DIAGNOSIS — L89152 Pressure ulcer of sacral region, stage 2: Secondary | ICD-10-CM | POA: Diagnosis not present

## 2021-12-31 DIAGNOSIS — N2581 Secondary hyperparathyroidism of renal origin: Secondary | ICD-10-CM | POA: Diagnosis not present

## 2021-12-31 DIAGNOSIS — E1129 Type 2 diabetes mellitus with other diabetic kidney complication: Secondary | ICD-10-CM | POA: Diagnosis not present

## 2021-12-31 DIAGNOSIS — N186 End stage renal disease: Secondary | ICD-10-CM | POA: Diagnosis not present

## 2021-12-31 DIAGNOSIS — Z992 Dependence on renal dialysis: Secondary | ICD-10-CM | POA: Diagnosis not present

## 2021-12-31 NOTE — Progress Notes (Signed)
[name] Barnett, [name] Barnett ([id number]) Visit Report for 12/30/2021 Chief Complaint Document Details Patient Name: Date of Service: [name] Barnett 12/30/2021 12:30 PM Medical Record Number: [id number] Patient Account Number: [id number] Date of Birth/Sex: Treating RN: 12/01/1980 (41 y.o. [name] Barnett Primary Care Provider: Julianne Handler Other Clinician: Referring Provider: Treating Provider/Extender: Eben Burow, Sharmin Weeks in Treatment: 5 Information Obtained from: Patient Chief Complaint 06/15/2019; patient is here for review of the wound on her lower sacrum 12/31/2020; patient is here for review of the wound on her right buttock 11/25/2021: Patient is here for evaluation and management of a stage II sacral pressure ulcer. Electronic Signature(s) Signed: 12/30/2021 1:21:08 PM By: Fredirick Maudlin MD FACS Entered By: Fredirick Maudlin on 12/30/2021 13:21:08 -------------------------------------------------------------------------------- Debridement Details Patient Name: Date of Service: [name] Barnett, [name] Barnett 12/30/2021 12:30 PM Medical Record Number: [id number] Patient Account Number: [id number] Date of Birth/Sex: Treating RN: 04-Jan-1981 (41 y.o. [name] Barnett Primary Care Provider: Julianne Handler Other Clinician: Referring Provider: Treating Provider/Extender: Eben Burow, Sharmin Weeks in Treatment: 5 Debridement Performed for Assessment: Wound #8 Sacrum Performed By: Physician Fredirick Maudlin, MD Debridement Type: Debridement Level of Consciousness (Pre-procedure): Awake and Alert Pre-procedure Verification/Time Out Yes - 13:05 Taken: Start Time: 13:10 Pain Control: Lidocaine 5% topical ointment T Area Debrided (L x W): otal 0.3 (cm) x 0.2 (cm) = 0.06 (cm) Tissue and other material debrided: Non-Viable, Slough, Skin: Dermis , Skin: Epidermis, Slough Level: Skin/Epidermis Debridement Description: Selective/Open  Wound Instrument: Curette Bleeding: Minimum Hemostasis Achieved: Pressure Procedural Pain: 0 Post Procedural Pain: 0 Response to Treatment: Procedure was tolerated well Level of Consciousness (Post- Awake and Alert procedure): Post Debridement Measurements of Total Wound Length: (cm) 0.3 Stage: Category/Stage II Width: (cm) 0.2 Depth: (cm) 0.1 Volume: (cm) 0.005 Character of Wound/Ulcer Post Debridement: Improved Post Procedure Diagnosis Same as Pre-procedure Electronic Signature(s) Signed: 12/30/2021 1:34:45 PM By: Fredirick Maudlin MD FACS Signed: 12/30/2021 4:37:26 PM By: Sharyn Creamer RN, BSN Entered By: Sharyn Creamer on 12/30/2021 13:11:26 -------------------------------------------------------------------------------- HPI Details Patient Name: Date of Service: [name] Barnett, [name] Barnett 12/30/2021 12:30 PM Medical Record Number: [id number] Patient Account Number: [id number] Date of Birth/Sex: Treating RN: 03-Jan-1981 (41 y.o. [name] Barnett Primary Care Provider: Julianne Handler Other Clinician: Referring Provider: Treating Provider/Extender: Eben Burow, Sharmin Weeks in Treatment: 5 History of Present Illness HPI Description: Admission 06/15/2019 This is a 41 year old woman who comes to [place] from [facility] skilled facility in [facility]. I am able to follow a lot of her history on care everywhere in epic. The patient is a type II diabetic and has been on dialysis. 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 I am not exactly sure of the nature of the stroke at this present time. She has made a decent recovery and is now up walking with a walker. In August 2020 she was admitted to [facility] in [place] with L4-L5 discitis. Culture and sensitivity showed Klebsiella. She was discharged on 8 weeks of ceftazidime. In mid September she is readmitted to the hospital  with a pressure ulcer on her lower sacrum and necrotizing fasciitis. She required an extensive surgical debridement. CT scan of the pelvis at 1 point showed communication with the rectum. A colostomy was recommended and placed. Culture of this area showed Klebsiella and yeast. She was discharged on an extended course of meropenem and Diflucan. [facility] [place] We do not  have any of these records. I think she was discharged to another nursing home but is come to [facility] skilled facility in [facility]. She was sent down here for review of the remaining wound. By review of the pictures that the patient had from September this is made considerable improvement. She tells me that the wound VAC was discontinued at the end of December 2020. Currently the facility is using a form of silver alginate to the wound surface. Past medical history type 2 diabetes with chronic renal failure on dialysis Tuesday Thursday and Saturday. CVA with left hemiparesis in June 2020 but she appears to be making a good recovery. Chronic diastolic heart failure, hypertension 2/19; patient's wound area slightly smaller. The area that was towards her anus has closed over. We are using Santyl covered with Hydrofera Blue. We have not been able to get her lab work apparently her veins are very difficult for phlebotomy. They are trying to get the blood work done where she dialyzes in [facility] but that requires some logistical issues. 2/26; not much change in the wound. Still a nonviable surface we have been using Santyl and Hydrofera Blue. I changed her to [facility] only. She tells me she has rigorously offloading this area she also complains of some pain 3/5; still no change here. I switch to pure Hydrofera Blue. She still complains of a lot of pain although is a big wound it seems out of proportion to what I might expect. This was initially a surgical wound  secondary to necrotizing wound infection. She is on dialysis. Other than that she thinks she is offloading this fairly rigorously. She has not been systemically unwell 3/26; I switched to Iodoflex 3 weeks ago. This really seems to have helped, much better surface and slight improvement in the surface area. 4/26; the patient is still on Iodoflex she missed an appointment 2 weeks ago. Doing slightly smaller. She is on dialysis 5/10; using Hydrofera Blue. The major improvement here is the complete lack of depth. She has islands of epithelialization 09/24/19-Patient comes in at 2 weeks, dimensions about the same, there is an [place] of epithelium at margin of wound, no slough noted 10/15/2019 upon evaluation today patient appears to be doing well with regard to her wound. She has been tolerating the dressing changes in the sacral region without complication and at this point she seems to be healing quite nicely. I am extremely pleased with how things are progressing and the epithelial tissue seems to be spreading from the central portion as well which is also excellent. 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 We have been using Hydrofera Blue. The smaller area which has depth at 0.7 cm there is a question about whether they have been packing anything into this or just flaring Hydrofera Blue over the top 9/23; its been more than 2 months since this patient was here. She still has a small area distally which is the remanent of her stage IV wound. They have been using Hydrofera Blue. She also has an area on the left gluteal 10/22; 1 month follow-up. I think the original sacral wound is closed however the patient has 2 areas distally 1 over the coccyx a small pinpoint area and the other into the gluteal cleft. Neither 1 of these looks particularly infected. I thought they might be connected but they were not. 11/15; the original sacral wound is  closed  patient only has 1 remaining area. 1 of these is in the gluteal cleft just below the coccyx. The area over the coccyx from last time I think is all so closed 12/31/2020; this is a patient that we saw for a pressure ulcer over her lower sacrum and coccyx for a long period of time in 2021. She eventually healed over. She is at [facility] skilled facility largely secondary to a remote CVA she had. She is also end-stage renal disease on dialysis secondary to type 2 diabetes. She tells [place] that since about mid June she has had an area more distally than the original sacrum/coccyx wound it looks as though they are putting silver alginate in this area. She is limited in her ability to offload this I think spending most of her time in a wheelchair although she can stand to transfer. She has an ostomy During her original stay here in 2021 I also list that she had a more distal area into her perineum that closed over well before the more proximal coccyx/sacral wound. She had a CT scan of the abdomen and pelvis on 12/01/2020. This did note first chronic sacral decubitus along the right aspect of her distal sacrum and coccyx. There was not felt to be bone destruction she has chronic changes of bilateral sacroiliitis and chronic endplate destruction and sclerosis at L4-L5 secondary to previous osteomyelitis however no new findings were felt to be seen in terms of bone problems in this area 01/14/2021; 2-week follow-up. Her wound is smaller essentially in the coccyx area. We have been using silver collagen. 10/5; 3-week follow-up. The patient returns with her area in the coccyx healed however distally in the perineum there is a fairly long stage II area. I am assuming this patient's wound is a pressure related area. Although this would not be a usual pressure area for a normal situation her immobility secondary to remote CVA, dialysis etc. make her at risk for pressure areas in an otherwise unusual setting. The  patient is fairly upset stating that we were told about this last time. I do not have this in my records. Her original wound was small and just about healed at that point and this visit was arranged in follow-up. If there was a new wound more distally into her perineum we did not look at it 11/2; patient's wound is slightly smaller. This is in the gluteal cleft on the left. Using silver alginate. She tells me she is aggressively offloading this area. 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 We have been using silver alginate. Arrives in clinic today with the wound larger. 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 We have been using silver alginate. PCR culture I did last week was polymicrobial with multiple organisms we put Polysporin under the silver alginate at the facility. According to the patient this got started 05/13/2021; the wound looks slightly smaller. This is in the gluteal cleft on the left. She is using silver alginate with topical antibiotics underneath. The topical antibiotics relate to a deep tissue culture I did some weeks ago. This showed a lawn of low titer bacteria. I am not sure how much she is offloading this area both at dialysis and during the day at  the facility would be the other issue 2/8; continued nice improvement the wound is smaller. This is on the gluteal cleft on the left. She is using topical antibiotics/triple antibiotic with silver alginate. 07/08/2021: The wound in her perineum continues to improve. Daily dressing changes with triple antibiotic and silver alginate are being employed. There is a small amount of slough at the base. A new problem today is that somehow she suffered a traumatic laceration to her left  fourth toe. It is bleeding briskly in clinic. She is insensate in that area and has no idea how the injury occurred. 07/14/2021: The perineal wound looks a bit better, but unfortunately, the sacral wound that was just cranial to this has reopened. It is stage II with minimal debris and slough. The traumatic laceration to her left fourth toe has a gray thick slough. She denies any fevers, chills, or other concerns for systemic infection. 07/24/2021: Both the perineal and sacral wounds appear roughly the same today with minimal slough. The traumatic laceration to her left fourth toe looks a bit worse with necrosis of muscle and exposed tendon. No fevers or chills or any concern for systemic infection. 07/31/2021: Both perineal and sacral wounds are perhaps a little bit smaller and are very clean today. The traumatic laceration to her left fourth toe is markedly improved. No necrotic tissue was appreciated. She apparently had her x-ray performed at [facility] but it was not forwarded to [place] for review of the results. It appears that her infectious disease doctor has also referred her to podiatry because of the toe. 08/14/2021: The perineal wound is about the same size, but the sacral wound has contracted quite substantially. The traumatic laceration to her left fourth toe has made the most dramatic improvement. Minimal slough and the wound is closing up. We have been using Santyl and Hydrofera Blue on the toe and silver alginate to both the sacral and perineal wounds. 08/26/2021: The wound on her toe is nearly closed. The perineal wound is stable and the sacral wound is smaller. No concern for infection. READMISSION 11/25/2021 After our last visit, the patient left her skilled nursing facility. She has not been seen or received any significant wound care since then. Her toe is healed and her perineal wound has also epithelialized. She just has a small stage II sacral ulcer. The surface has slough  accumulation and there is some heaped up senescent skin around the borders. 12/16/2021: The ulcer is smaller today with just 2 1-2 mm or so sized openings. There is eschar and some senescent skin around the borders. 12/30/2021: The wound is unchanged from her last visit. Apparently she has not been receiving home health services. There is a little slough and senescent skin once again. Electronic Signature(s) Signed: 12/30/2021 1:21:50 PM By: Fredirick Maudlin MD FACS Entered By: Fredirick Maudlin on 12/30/2021 13:21:49 -------------------------------------------------------------------------------- Physical Exam Details Patient Name: Date of Service: [name] Barnett, [name] Barnett 12/30/2021 12:30 PM Medical Record Number: [id number] Patient Account Number: [id number] Date of Birth/Sex: Treating RN: 27-May-1980 (41 y.o. [name] Barnett Primary Care Provider: Julianne Handler Other Clinician: Referring Provider: Treating Provider/Extender: Eben Burow, Sharmin Weeks in Treatment: 5 Constitutional Hypertensive, asymptomatic. . . . No acute distress.Marland Kitchen Respiratory Normal work of breathing on room air.. Notes 12/30/2021: The wound is unchanged from her last visit. There is a little slough and senescent skin once again. Electronic Signature(s) Signed: 12/30/2021 1:22:26 PM By: Fredirick Maudlin MD FACS Entered By: Fredirick Maudlin on 12/30/2021 13:22:26 -------------------------------------------------------------------------------- Physician  Orders Details Patient Name: Date of Service: [name] Barnett, [name] Barnett 12/30/2021 12:30 PM Medical Record Number: [id number] Patient Account Number: [id number] Date of Birth/Sex: Treating RN: [birth year] (41 y.o. [name] Barnett Primary Care Provider: Julianne Handler Other Clinician: Referring Provider: Treating Provider/Extender: Eben Burow, Sharmin Weeks in Treatment: 5 Verbal / Phone Orders: No Diagnosis  Coding ICD-10 Coding Code Description L89.152 Pressure ulcer of sacral region, stage 2 E11.622 Type 2 diabetes mellitus with other skin ulcer N18.6 End stage renal disease Z99.2 Dependence on renal dialysis Follow-up Appointments ppointment in 2 weeks. - Dr Celine Ahr - Room 1 Return A Bathing/ Shower/ Hygiene May shower with protection but do not get wound dressing(s) wet. Off-Loading Turn and reposition every 2 hours [place] wound care orders this week; continue Home Health for wound care. May utilize formulary equivalent dressing for wound treatment orders unless otherwise specified. Dressing changes to be completed by [place] on Monday / Wednesday / Friday except when patient has scheduled visit at [facility]. Other Home Health Orders/Instructions: - [facility] Wound Treatment Wound #8 - Sacrum Cleanser: Wound Cleanser 1 x Per Day/30 Days Discharge Instructions: Cleanse the wound with wound cleanser prior to applying a clean dressing using gauze sponges, not tissue or cotton balls. Peri-Wound Care: Skin Prep 1 x Per Day/30 Days Discharge Instructions: Use skin prep as directed Prim Dressing: KerraCel Ag Gelling Fiber Dressing, 2x2 in (silver alginate) 1 x Per Day/30 Days ary Discharge Instructions: Apply silver alginate to wound bed as instructed Secondary Dressing: ALLEVYN Gentle Border, 3x3 (in/in) 1 x Per Day/30 Days Discharge Instructions: Apply over primary dressing as directed. Secured With: 31M Medipore H Soft Cloth Surgical T ape, 4 x 10 (in/yd) (Generic) 1 x Per Day/30 Days Discharge Instructions: Secure with tape as directed. Electronic Signature(s) Signed: 12/30/2021 1:34:45 PM By: Fredirick Maudlin MD FACS Entered By: Fredirick Maudlin on 12/30/2021 13:22:38 -------------------------------------------------------------------------------- Problem List Details Patient Name: Date of Service: [name] Barnett, [name] Barnett 12/30/2021 12:30 PM Medical Record Number:  [id number] Patient Account Number: [id number] Date of Birth/Sex: Treating RN: [birth year] (41 y.o. [name] Barnett Primary Care Provider: Julianne Handler Other Clinician: Referring Provider: Treating Provider/Extender: Eben Burow, Sharmin Weeks in Treatment: 5 Active Problems ICD-10 Encounter Code Description Active Date MDM Diagnosis L89.152 Pressure ulcer of sacral region, stage 2 11/25/2021 No Yes E11.622 Type 2 diabetes mellitus with other skin ulcer 11/25/2021 No Yes N18.6 End stage renal disease 11/25/2021 No Yes Z99.2 Dependence on renal dialysis 11/25/2021 No Yes Inactive Problems Resolved Problems Electronic Signature(s) Signed: 12/30/2021 1:19:33 PM By: Fredirick Maudlin MD FACS Entered By: Fredirick Maudlin on 12/30/2021 13:19:33 -------------------------------------------------------------------------------- Progress Note Details Patient Name: Date of Service: [name] Barnett, [name] Barnett 12/30/2021 12:30 PM Medical Record Number: [id number] Patient Account Number: [id number] Date of Birth/Sex: Treating RN: [birth year] (41 y.o. [name] Barnett Primary Care Provider: Julianne Handler Other Clinician: Referring Provider: Treating Provider/Extender: Eben Burow, Sharmin Weeks in Treatment: 5 Subjective Chief Complaint Information obtained from Patient 06/15/2019; patient is here for review of the wound on her lower sacrum 12/31/2020; patient is here for review of the wound on her right buttock 11/25/2021: Patient is here for evaluation and management of a stage II sacral pressure ulcer. History of Present Illness (HPI) Admission 06/15/2019 This is a 41 year old woman who comes to [place] from [facility] skilled facility in [facility]. I am able to follow a lot of her history on care everywhere in epic. The patient is a type II  diabetic and has been on dialysis. 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 I am not exactly sure of the nature of the stroke at this present time. She has made a decent recovery and is now up walking with a walker. In August 2020 she was admitted to [facility] in [place] with L4-L5 discitis. Culture and sensitivity showed Klebsiella. She was discharged on 8 weeks of ceftazidime. In mid September she is readmitted to the hospital with a pressure ulcer on her lower sacrum and necrotizing fasciitis. She required an extensive surgical debridement. CT scan of the pelvis at 1 point showed communication with the rectum. A colostomy was recommended and placed. Culture of this area showed Klebsiella and yeast. She was discharged on an extended course of meropenem and Diflucan. [facility] [place] We do not have any of these records. I think she was discharged to another nursing home but is come to [facility] skilled facility in [facility]. She was sent down here for review of the remaining wound. By review of the pictures that the patient had from September this is made considerable improvement. She tells me that the wound VAC was discontinued at the end of December 2020. Currently the facility is using a form of silver alginate to the wound surface. Past medical history type 2 diabetes with chronic renal failure on dialysis Tuesday Thursday and Saturday. CVA with left hemiparesis in June 2020 but she appears to be making a good recovery. Chronic diastolic heart failure, hypertension 2/19; patient's wound area slightly smaller. The area that was towards her anus has closed over. We are using Santyl covered with Hydrofera Blue. We have not been able to get her lab work apparently her veins are very difficult for phlebotomy. They are trying to get the blood work done where she dialyzes in [facility] but that requires some logistical issues. 2/26; not much change in  the wound. Still a nonviable surface we have been using Santyl and Hydrofera Blue. I changed her to [facility] only. She tells me she has rigorously offloading this area she also complains of some pain 3/5; still no change here. I switch to pure Hydrofera Blue. She still complains of a lot of pain although is a big wound it seems out of proportion to what I might expect. This was initially a surgical wound secondary to necrotizing wound infection. She is on dialysis. Other than that she thinks she is offloading this fairly rigorously. She has not been systemically unwell 3/26; I switched to Iodoflex 3 weeks ago. This really seems to have helped, much better surface and slight improvement in the surface area. 4/26; the patient is still on Iodoflex she missed an appointment 2 weeks ago. Doing slightly smaller. She is on dialysis 5/10; using Hydrofera Blue. The major improvement here is the complete lack of depth. She has islands of epithelialization 09/24/19-Patient comes in at 2 weeks, dimensions about the same, there is an [place] of epithelium at margin of wound, no slough noted 10/15/2019 upon evaluation today patient appears to be doing well with regard to her wound. She has been tolerating the dressing changes in the sacral region without complication and at this point she seems to be healing quite nicely. I am extremely pleased with how things are progressing and the epithelial tissue seems to be spreading from the central portion as well which is also  excellent. 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 We have been using Hydrofera Blue. The smaller area which has depth at 0.7 cm there is a question about whether they have been packing anything into this or just flaring Hydrofera Blue over the top 9/23; its been more than 2 months since this patient was here. She still has a small area distally which is the remanent of her stage IV  wound. They have been using Hydrofera Blue. She also has an area on the left gluteal 10/22; 1 month follow-up. I think the original sacral wound is closed however the patient has 2 areas distally 1 over the coccyx a small pinpoint area and the other into the gluteal cleft. Neither 1 of these looks particularly infected. I thought they might be connected but they were not. 11/15; the original sacral wound is closed patient only has 1 remaining area. 1 of these is in the gluteal cleft just below the coccyx. The area over the coccyx from last time I think is all so closed 12/31/2020; this is a patient that we saw for a pressure ulcer over her lower sacrum and coccyx for a long period of time in 2021. She eventually healed over. She is at [facility] skilled facility largely secondary to a remote CVA she had. She is also end-stage renal disease on dialysis secondary to type 2 diabetes. She tells [place] that since about mid June she has had an area more distally than the original sacrum/coccyx wound it looks as though they are putting silver alginate in this area. She is limited in her ability to offload this I think spending most of her time in a wheelchair although she can stand to transfer. She has an ostomy During her original stay here in 2021 I also list that she had a more distal area into her perineum that closed over well before the more proximal coccyx/sacral wound. She had a CT scan of the abdomen and pelvis on 12/01/2020. This did note first chronic sacral decubitus along the right aspect of her distal sacrum and coccyx. There was not felt to be bone destruction she has chronic changes of bilateral sacroiliitis and chronic endplate destruction and sclerosis at L4-L5 secondary to previous osteomyelitis however no new findings were felt to be seen in terms of bone problems in this area 01/14/2021; 2-week follow-up. Her wound is smaller essentially in the coccyx area. We have been using silver  collagen. 10/5; 3-week follow-up. The patient returns with her area in the coccyx healed however distally in the perineum there is a fairly long stage II area. I am assuming this patient's wound is a pressure related area. Although this would not be a usual pressure area for a normal situation her immobility secondary to remote CVA, dialysis etc. make her at risk for pressure areas in an otherwise unusual setting. The patient is fairly upset stating that we were told about this last time. I do not have this in my records. Her original wound was small and just about healed at that point and this visit was arranged in follow-up. If there was a new wound more distally into her perineum we did not look at it 11/2; patient's wound is slightly smaller. This is in the gluteal cleft on the left. Using silver alginate. She tells me she is aggressively offloading this area. 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 We have been using silver alginate. Arrives in clinic today with the wound larger. 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 We have been using silver alginate. PCR culture I did last week was polymicrobial with multiple organisms we put Polysporin under the silver alginate at the facility. According to the patient this got started 05/13/2021; the wound looks slightly smaller. This is in the gluteal cleft on the left. She is using silver alginate with topical antibiotics underneath. The topical antibiotics relate to a deep tissue culture I did some weeks ago. This showed a lawn of low titer bacteria. I am not sure how much she is offloading this area both at dialysis and during the day at the facility would be the other issue 2/8; continued nice  improvement the wound is smaller. This is on the gluteal cleft on the left. She is using topical antibiotics/triple antibiotic with silver alginate. 07/08/2021: The wound in her perineum continues to improve. Daily dressing changes with triple antibiotic and silver alginate are being employed. There is a small amount of slough at the base. A new problem today is that somehow she suffered a traumatic laceration to her left fourth toe. It is bleeding briskly in clinic. She is insensate in that area and has no idea how the injury occurred. 07/14/2021: The perineal wound looks a bit better, but unfortunately, the sacral wound that was just cranial to this has reopened. It is stage II with minimal debris and slough. The traumatic laceration to her left fourth toe has a gray thick slough. She denies any fevers, chills, or other concerns for systemic infection. 07/24/2021: Both the perineal and sacral wounds appear roughly the same today with minimal slough. The traumatic laceration to her left fourth toe looks a bit worse with necrosis of muscle and exposed tendon. No fevers or chills or any concern for systemic infection. 07/31/2021: Both perineal and sacral wounds are perhaps a little bit smaller and are very clean today. The traumatic laceration to her left fourth toe is markedly improved. No necrotic tissue was appreciated. She apparently had her x-ray performed at [facility] but it was not forwarded to [place] for review of the results. It appears that her infectious disease doctor has also referred her to podiatry because of the toe. 08/14/2021: The perineal wound is about the same size, but the sacral wound has contracted quite substantially. The traumatic laceration to her left fourth toe has made the most dramatic improvement. Minimal slough and the wound is closing up. We have been using Santyl and Hydrofera Blue on the toe and silver alginate to both the sacral and perineal wounds. 08/26/2021: The wound on  her toe is nearly closed. The perineal wound is stable and the sacral wound is smaller. No concern for infection. READMISSION 11/25/2021 After our last visit, the patient left her skilled nursing facility. She has not been seen or received any significant wound care since then. Her toe is healed and her perineal wound has also epithelialized. She just has a small stage II sacral ulcer. The surface has slough accumulation and there is some heaped up senescent skin around the borders. 12/16/2021: The ulcer is smaller today with just 2 1-2 mm or so sized openings. There is eschar and some senescent skin around the borders.  12/30/2021: The wound is unchanged from her last visit. Apparently she has not been receiving home health services. There is a little slough and senescent skin once again. Patient History Information obtained from Patient, Chart. Family History Diabetes - Maternal Grandparents, Heart Disease - Mother,Father, Hypertension - Maternal Grandparents, Kidney Disease - Maternal Grandparents, Lung Disease - Mother, Stroke - Mother,Father, Thyroid Problems - Mother,Siblings, No family history of Cancer, Hereditary Spherocytosis, Seizures, Tuberculosis. Social History Never smoker, Marital Status - Separated, Alcohol Use - Never, Drug Use - No History, Caffeine Use - Daily - coffee. Medical History Eyes Patient has history of Cataracts, Glaucoma Ear/Nose/Mouth/Throat Denies history of Chronic sinus problems/congestion, Middle ear problems Hematologic/Lymphatic Patient has history of Anemia Cardiovascular Patient has history of Congestive Heart Failure, Hypertension Denies history of Angina, Arrhythmia, Coronary Artery Disease, Deep Vein Thrombosis, Hypotension, Myocardial Infarction, Peripheral Arterial Disease, Peripheral Venous Disease, Phlebitis, Vasculitis Endocrine Patient has history of Type II Diabetes Genitourinary Patient has history of End Stage Renal Disease -  HD Integumentary (Skin) Denies history of History of Burn Musculoskeletal Denies history of Gout, Rheumatoid Arthritis, Osteoarthritis, Osteomyelitis Neurologic Patient has history of Neuropathy Denies history of Dementia, Quadriplegia, Paraplegia, Seizure Disorder Oncologic Denies history of Received Chemotherapy, Received Radiation Psychiatric Patient has history of Confinement Anxiety Denies history of Anorexia/bulimia Hospitalization/Surgery History - right arm AV fistula. - cardiac cath with stents. - sacral wound debridement. - left knee surgery. - catract extraction. - colostomy. Medical A Surgical History Notes nd Cardiovascular hyperlipidemia Gastrointestinal GERD, diverting colostomy Musculoskeletal left side weakness Neurologic CVA left mild hemiplegia, lumbar discitis Psychiatric depression, anxiety Objective Constitutional Hypertensive, asymptomatic. No acute distress.. Vitals Time Taken: 1:01 PM, Height: 65 in, Weight: 170 lbs, BMI: 28.3, Temperature: 98.3 F, Pulse: 85 bpm, Respiratory Rate: 16 breaths/min, Blood Pressure: 150/93 mmHg. Respiratory Normal work of breathing on room air.. General Notes: 12/30/2021: The wound is unchanged from her last visit. There is a little slough and senescent skin once again. Integumentary (Hair, Skin) Wound #8 status is Open. Original cause of wound was Pressure Injury. The date acquired was: 07/15/2021. The wound has been in treatment 24 weeks. The wound is located on the Sacrum. The wound measures 0.3cm length x 0.2cm width x 0.1cm depth; 0.047cm^2 area and 0.005cm^3 volume. There is Fat Layer (Subcutaneous Tissue) exposed. There is no tunneling or undermining noted. There is a medium amount of serosanguineous drainage noted. The wound margin is distinct with the outline attached to the wound base. There is medium (34-66%) pink granulation within the wound bed. There is a medium (34-66%) amount of necrotic tissue within the  wound bed including Adherent Slough. Assessment Active Problems ICD-10 Pressure ulcer of sacral region, stage 2 Type 2 diabetes mellitus with other skin ulcer End stage renal disease Dependence on renal dialysis Procedures Wound #8 Pre-procedure diagnosis of Wound #8 is a Pressure Ulcer located on the Sacrum . There was a Selective/Open Wound Skin/Epidermis Debridement with a total area of 0.06 sq cm performed by Fredirick Maudlin, MD. With the following instrument(s): Curette to remove Non-Viable tissue/material. Material removed includes Slough, Skin: Dermis, and Skin: Epidermis after achieving pain control using Lidocaine 5% topical ointment. No specimens were taken. A time out was conducted at 13:05, prior to the start of the procedure. A Minimum amount of bleeding was controlled with Pressure. The procedure was tolerated well with a pain level of 0 throughout and a pain level of 0 following the procedure. Post Debridement Measurements: 0.3cm length x 0.2cm width x 0.1cm  depth; 0.005cm^3 volume. Post debridement Stage noted as Category/Stage II. Character of Wound/Ulcer Post Debridement is improved. Post procedure Diagnosis Wound #8: Same as Pre-Procedure Plan Follow-up Appointments: Return Appointment in 2 weeks. - Dr Celine Ahr - Room 1 Bathing/ Shower/ Hygiene: May shower with protection but do not get wound dressing(s) wet. Off-Loading: Turn and reposition every 2 hours Home Health: New wound care orders this week; continue Home Health for wound care. May utilize formulary equivalent dressing for wound treatment orders unless otherwise specified. Dressing changes to be completed by [facility] on Monday / Wednesday / Friday except when patient has scheduled visit at [facility]. Other Home Health Orders/Instructions: - [facility] WOUND #8: - Sacrum Wound Laterality: Cleanser: Wound Cleanser 1 x Per Day/30 Days Discharge Instructions: Cleanse the wound with wound cleanser prior  to applying a clean dressing using gauze sponges, not tissue or cotton balls. Peri-Wound Care: Skin Prep 1 x Per Day/30 Days Discharge Instructions: Use skin prep as directed Prim Dressing: KerraCel Ag Gelling Fiber Dressing, 2x2 in (silver alginate) 1 x Per Day/30 Days ary Discharge Instructions: Apply silver alginate to wound bed as instructed Secondary Dressing: ALLEVYN Gentle Border, 3x3 (in/in) 1 x Per Day/30 Days Discharge Instructions: Apply over primary dressing as directed. Secured With: 18M Medipore H Soft Cloth Surgical T ape, 4 x 10 (in/yd) (Generic) 1 x Per Day/30 Days Discharge Instructions: Secure with tape as directed. 12/30/2021: The wound is unchanged from her last visit. Apparently she has not been receiving home health services. There is a little slough and senescent skin once again. I used a curette to debride slough and senescent skin from her wound. We will continue to use silver alginate with foam border. We will look into why she has not received home health services. Follow-up in 2 weeks. Electronic Signature(s) Signed: 12/30/2021 1:23:04 PM By: Fredirick Maudlin MD FACS Entered By: Fredirick Maudlin on 12/30/2021 13:23:04 -------------------------------------------------------------------------------- HxROS Details Patient Name: Date of Service: [name] Barnett, [name] Barnett 12/30/2021 12:30 PM Medical Record Number: [id number] Patient Account Number: [id number] Date of Birth/Sex: Treating RN: Apr 08, 1981 (41 y.o. [name] Barnett Primary Care Provider: Julianne Handler Other Clinician: Referring Provider: Treating Provider/Extender: Eben Burow, Sharmin Weeks in Treatment: 5 Information Obtained From Patient Chart Eyes Medical History: Positive for: Cataracts; Glaucoma Ear/Nose/Mouth/Throat Medical History: Negative for: Chronic sinus problems/congestion; Middle ear problems Hematologic/Lymphatic Medical History: Positive for:  Anemia Cardiovascular Medical History: Positive for: Congestive Heart Failure; Hypertension Negative for: Angina; Arrhythmia; Coronary Artery Disease; Deep Vein Thrombosis; Hypotension; Myocardial Infarction; Peripheral Arterial Disease; Peripheral Venous Disease; Phlebitis; Vasculitis Past Medical History Notes: hyperlipidemia Gastrointestinal Medical History: Past Medical History Notes: GERD, diverting colostomy Endocrine Medical History: Positive for: Type II Diabetes Time with diabetes: 26 yrs Treated with: Insulin Blood sugar tested every day: Yes Tested : 3 times per day Genitourinary Medical History: Positive for: End Stage Renal Disease - HD Integumentary (Skin) Medical History: Negative for: History of Burn Musculoskeletal Medical History: Negative for: Gout; Rheumatoid Arthritis; Osteoarthritis; Osteomyelitis Past Medical History Notes: left side weakness Neurologic Medical History: Positive for: Neuropathy Negative for: Dementia; Quadriplegia; Paraplegia; Seizure Disorder Past Medical History Notes: CVA left mild hemiplegia, lumbar discitis Oncologic Medical History: Negative for: Received Chemotherapy; Received Radiation Psychiatric Medical History: Positive for: Confinement Anxiety Negative for: Anorexia/bulimia Past Medical History Notes: depression, anxiety HBO Extended History Items Eyes: Eyes: Cataracts Glaucoma Immunizations Pneumococcal Vaccine: Received Pneumococcal Vaccination: Yes Received Pneumococcal Vaccination On or After 60th Birthday: No Implantable Devices Yes Hospitalization / Surgery History  Type of Hospitalization/Surgery right arm AV fistula cardiac cath with stents sacral wound debridement left knee surgery catract extraction colostomy Family and Social History Cancer: No; Diabetes: Yes - Maternal Grandparents; Heart Disease: Yes - Mother,Father; Hereditary Spherocytosis: No; Hypertension: Yes - Maternal Grandparents;  Kidney Disease: Yes - Maternal Grandparents; Lung Disease: Yes - Mother; Seizures: No; Stroke: Yes - Mother,Father; Thyroid Problems: Yes - Mother,Siblings; Tuberculosis: No; Never smoker; Marital Status - Separated; Alcohol Use: Never; Drug Use: No History; Caffeine Use: Daily - coffee; Financial Concerns: No; Food, Clothing or Shelter Needs: No; Support System Lacking: No; Transportation Concerns: No Engineer, maintenance) Signed: 12/30/2021 1:34:45 PM By: Fredirick Maudlin MD FACS Signed: 12/31/2021 6:25:40 PM By: [name] RN, BSN Entered By: Fredirick Maudlin on 12/30/2021 13:21:55 -------------------------------------------------------------------------------- [place] Details Patient Name: Date of Service: [name] DA SYLEENA, MCHAN 12/30/2021 Medical Record Number: [id number] Patient Account Number: [id number] Date of Birth/Sex: Treating RN: [birth year] (41 y.o. [name] Barnett Primary Care Provider: Julianne Handler Other Clinician: Referring Provider: Treating Provider/Extender: Eben Burow, Sharmin Weeks in Treatment: 5 Diagnosis Coding ICD-10 Codes Code Description [phone] Pressure ulcer of sacral region, stage 2 E11.622 Type 2 diabetes mellitus with other skin ulcer N18.6 End stage renal disease Z99.2 Dependence on renal dialysis Facility Procedures Physician Procedures : CPT4 Code Description Modifier [id number] - WC PHYS LEVEL 3 - EST PT 25 ICD-10 Diagnosis Description L89.152 Pressure ulcer of sacral region, stage 2 E11.622 Type 2 diabetes mellitus with other skin ulcer N18.6 End stage renal disease Z99.2  Dependence on renal dialysis Quantity: 1 : [id number] - WC PHYS DEBR WO ANESTH 20 SQ CM ICD-10 Diagnosis Description L89.152 Pressure ulcer of sacral region, stage 2 Quantity: 1 Electronic Signature(s) Signed: 12/30/2021 1:23:22 PM By: Fredirick Maudlin MD FACS Entered By: Fredirick Maudlin on 12/30/2021 13:23:22

## 2021-12-31 NOTE — Progress Notes (Signed)
[name] Barnett ([id number]) Visit Report for 12/30/2021 Arrival Information Details Patient Name: Date of Service: [name] Barnett, [name] Barnett 12/30/2021 12:30 PM Medical Record Number: [id number] Patient Account Number: [id number] Date of Birth/Sex: Treating RN: [birth year] (41 y.o. [name] Barnett Primary Care Sydne Krahl: Julianne Handler Other Clinician: Referring Chihiro Frey: Treating Abimbola Aki/Extender: Eben Burow, Sharmin Weeks in Treatment: 5 Visit Information History Since Last Visit Added or deleted any medications: No Patient Arrived: Wheel Chair Any new allergies or adverse reactions: No Arrival Time: 13:01 Had a fall or experienced change in No Accompanied By: self activities of daily living that may affect Transfer Assistance: None risk of falls: Patient Identification Verified: Yes Signs or symptoms of abuse/neglect since last visito No Secondary Verification Process Completed: Yes Hospitalized since last visit: No Implantable device outside of the clinic excluding No cellular tissue based products placed in the center since last visit: Has Dressing in Place as Prescribed: Yes Pain Present Now: No Electronic Signature(s) Signed: 12/30/2021 4:37:26 PM By: Sharyn Creamer RN, BSN Entered By: Sharyn Creamer on 12/30/2021 13:02:27 -------------------------------------------------------------------------------- Encounter Discharge Information Details Patient Name: Date of Service: [name] Barnett, [name] Barnett 12/30/2021 12:30 PM Medical Record Number: [id number] Patient Account Number: [id number] Date of Birth/Sex: Treating RN: November 22, 1980 (41 y.o. [name] Barnett Primary Care Samella Lucchetti: Julianne Handler Other Clinician: Referring Kamon Fahr: Treating Conna Terada/Extender: Eben Burow, Sharmin Weeks in Treatment: 5 Encounter Discharge Information Items Post Procedure Vitals Discharge Condition: Stable Temperature (F): 98.3 Ambulatory  Status: Wheelchair Pulse (bpm): 85 Discharge Destination: Home Respiratory Rate (breaths/min): 16 Transportation: Private Auto Blood Pressure (mmHg): 150/93 Accompanied By: self Schedule Follow-up Appointment: Yes Clinical Summary of Care: Patient Declined Electronic Signature(s) Signed: 12/30/2021 4:37:26 PM By: Sharyn Creamer RN, BSN Entered By: Sharyn Creamer on 12/30/2021 13:14:07 -------------------------------------------------------------------------------- Lower Extremity Assessment Details Patient Name: Date of Service: [name] Barnett, [name] Barnett 12/30/2021 12:30 PM Medical Record Number: [id number] Patient Account Number: [id number] Date of Birth/Sex: Treating RN: [birth year] (41 y.o. [name] Barnett Primary Care Marina Desire: Julianne Handler Other Clinician: Referring Levone Otten: Treating Ellard Nan/Extender: Eben Burow, Sharmin Weeks in Treatment: 5 Electronic Signature(s) Signed: 12/30/2021 4:37:26 PM By: Sharyn Creamer RN, BSN Entered By: Sharyn Creamer on 12/30/2021 13:03:01 -------------------------------------------------------------------------------- Multi Wound Chart Details Patient Name: Date of Service: [name] Barnett, [name] Barnett 12/30/2021 12:30 PM Medical Record Number: [id number] Patient Account Number: [id number] Date of Birth/Sex: Treating RN: [birth year] (41 y.o. [name] Barnett Primary Care Manasa Spease: Julianne Handler Other Clinician: Referring Shizue Kaseman: Treating Kayanna Mckillop/Extender: Eben Burow, Sharmin Weeks in Treatment: 5 Vital Signs Height(in): 65 Pulse(bpm): 85 Weight(lbs): 170 Blood Pressure(mmHg): 150/93 Body Mass Index(BMI): 28.3 Temperature(F): 98.3 Respiratory Rate(breaths/min): 16 Photos: [N/A:N/A] Sacrum N/A N/A Wound Location: Pressure Injury N/A N/A Wounding Event: Pressure Ulcer N/A N/A Primary Etiology: Cataracts, Glaucoma, Anemia, N/A N/A Comorbid History: Congestive Heart  Failure, Hypertension, Type II Diabetes, End Stage Renal Disease, Neuropathy, Confinement Anxiety 07/15/2021 N/A N/A Date Acquired: 24 N/A N/A Weeks of Treatment: Open N/A N/A Wound Status: No N/A N/A Wound Recurrence: 0.3x0.2x0.1 N/A N/A Measurements L x W x D (cm) 0.047 N/A N/A A (cm) : rea 0.005 N/A N/A Volume (cm) : 88.90% N/A N/A % Reduction in A rea: 94.10% N/A N/A % Reduction in Volume: Category/Stage II N/A N/A Classification: Medium N/A N/A Exudate A mount: Serosanguineous N/A N/A Exudate Type: red, brown N/A N/A Exudate Color: Distinct, outline attached N/A N/A Wound Margin: Medium (34-66%) N/A N/A Granulation Amount: Pink N/A N/A Granulation Quality: Medium (34-66%) N/A N/A Necrotic Amount: Fat Layer (Subcutaneous Tissue):  Yes N/A N/A Exposed Structures: Fascia: No Tendon: No Muscle: No Joint: No Bone: No Large (67-100%) N/A N/A Epithelialization: Debridement - Selective/Open Wound N/A N/A Debridement: Pre-procedure Verification/Time Out 13:05 N/A N/A Taken: Lidocaine 5% topical ointment N/A N/A Pain Control: Slough N/A N/A Tissue Debrided: Skin/Epidermis N/A N/A Level: 0.06 N/A N/A Debridement A (sq cm): rea Curette N/A N/A Instrument: Minimum N/A N/A Bleeding: Pressure N/A N/A Hemostasis A chieved: 0 N/A N/A Procedural Pain: 0 N/A N/A Post Procedural Pain: Procedure was tolerated well N/A N/A Debridement Treatment Response: 0.3x0.2x0.1 N/A N/A Post Debridement Measurements L x W x D (cm) 0.005 N/A N/A Post Debridement Volume: (cm) Category/Stage II N/A N/A Post Debridement Stage: Debridement N/A N/A Procedures Performed: Treatment Notes Wound #8 (Sacrum) Cleanser Wound Cleanser Discharge Instruction: Cleanse the wound with wound cleanser prior to applying a clean dressing using gauze sponges, not tissue or cotton balls. Peri-Wound Care Skin Prep Discharge Instruction: Use skin prep as directed Topical Primary  Dressing KerraCel Ag Gelling Fiber Dressing, 2x2 in (silver alginate) Discharge Instruction: Apply silver alginate to wound bed as instructed Secondary Dressing ALLEVYN Gentle Border, 3x3 (in/in) Discharge Instruction: Apply over primary dressing as directed. Secured With 30M Medipore H Soft Cloth Surgical T ape, 4 x 10 (in/yd) Discharge Instruction: Secure with tape as directed. Compression Wrap Compression Stockings Add-Ons Electronic Signature(s) Signed: 12/30/2021 1:21:00 PM By: Fredirick Maudlin MD FACS Signed: 12/31/2021 6:25:40 PM By: [name] RN, BSN Entered By: Fredirick Maudlin on 12/30/2021 13:21:00 -------------------------------------------------------------------------------- Multi-Disciplinary Care Plan Details Patient Name: Date of Service: [name] Barnett, [name] Barnett 12/30/2021 12:30 PM Medical Record Number: [id number] Patient Account Number: [id number] Date of Birth/Sex: Treating RN: 08-30-1980 (41 y.o. [name] Barnett Primary Care Denyse Fillion: Other Clinician: Julianne Handler Referring Ladashia Demarinis: Treating Kiarra Kidd/Extender: Eben Burow, Sharmin Weeks in Treatment: 5 Active Inactive Wound/Skin Impairment Nursing Diagnoses: Impaired tissue integrity Knowledge deficit related to ulceration/compromised skin integrity Goals: Patient/caregiver will verbalize understanding of skin care regimen Date Initiated: 11/25/2021 Target Resolution Date: 01/08/2022 Goal Status: Active Ulcer/skin breakdown will have a volume reduction of 30% by week 4 Date Initiated: 11/25/2021 Target Resolution Date: 01/08/2022 Goal Status: Active Interventions: Assess patient/caregiver ability to obtain necessary supplies Assess ulceration(s) every visit Treatment Activities: Skin care regimen initiated : 11/25/2021 Topical wound management initiated : 11/25/2021 Notes: Electronic Signature(s) Signed: 12/30/2021 4:37:26 PM By: Sharyn Creamer RN, BSN Entered By:  Sharyn Creamer on 12/30/2021 13:12:19 -------------------------------------------------------------------------------- Pain Assessment Details Patient Name: Date of Service: [name] Barnett, [name] Barnett 12/30/2021 12:30 PM Medical Record Number: [id number] Patient Account Number: [id number] Date of Birth/Sex: Treating RN: [birth year] (41 y.o. [name] Barnett Primary Care Rivka Baune: Julianne Handler Other Clinician: Referring Berda Shelvin: Treating Nyliah Nierenberg/Extender: Eben Burow, Sharmin Weeks in Treatment: 5 Active Problems Location of Pain Severity and Description of Pain Patient Has Paino No Site Locations Pain Management and Medication Current Pain Management: Electronic Signature(s) Signed: 12/30/2021 4:37:26 PM By: Sharyn Creamer RN, BSN Entered By: Sharyn Creamer on 12/30/2021 13:02:55 -------------------------------------------------------------------------------- Patient/Caregiver Education Details Patient Name: Date of Service: [name] Barnett, [name] Barnett 8/30/2023andnbsp12:30 PM Medical Record Number: [id number] Patient Account Number: [id number] Date of Birth/Gender: Treating RN: [birth year] (41 y.o. [name] Barnett Primary Care Physician: Julianne Handler Other Clinician: Referring Physician: Treating Physician/Extender: Nelma Rothman in Treatment: 5 Education Assessment Education Provided To: Patient Education Topics Provided Wound/Skin Impairment: Methods: Explain/Verbal Responses: State content correctly Electronic Signature(s) Signed: 12/30/2021 4:37:26 PM By: Sharyn Creamer RN, BSN Entered By: Sharyn Creamer on 12/30/2021 13:12:34 -------------------------------------------------------------------------------- Wound Assessment  Details Patient Name: Date of Service: [name] Barnett, [name] Barnett 12/30/2021 12:30 PM Medical Record Number: [id number] Patient Account Number: [id number] Date of Birth/Sex: Treating  RN: August 22, 1980 (40 y.o. [name] Barnett Primary Care Serafin [place]: Julianne Handler Other Clinician: Referring Ciel Chervenak: Treating Teal Raben/Extender: Eben Burow, Sharmin Weeks in Treatment: 5 Wound Status Wound Number: 8 Primary Pressure Ulcer Etiology: Wound Location: Sacrum Wound Open Wounding Event: Pressure Injury Status: Date Acquired: 07/15/2021 Comorbid Cataracts, Glaucoma, Anemia, Congestive Heart Failure, Weeks Of Treatment: 24 History: Hypertension, Type II Diabetes, End Stage Renal Disease, Clustered Wound: No Neuropathy, Confinement Anxiety Photos Wound Measurements Length: (cm) 0.3 Width: (cm) 0.2 Depth: (cm) 0.1 Area: (cm) 0.047 Volume: (cm) 0.005 % Reduction in Area: 88.9% % Reduction in Volume: 94.1% Epithelialization: Large (67-100%) Tunneling: No Undermining: No Wound Description Classification: Category/Stage II Wound Margin: Distinct, outline attached Exudate Amount: Medium Exudate Type: Serosanguineous Exudate Color: red, brown Foul Odor After Cleansing: No Slough/Fibrino Yes Wound Bed Granulation Amount: Medium (34-66%) Exposed Structure Granulation Quality: Pink Fascia Exposed: No Necrotic Amount: Medium (34-66%) Fat Layer (Subcutaneous Tissue) Exposed: Yes Necrotic Quality: Adherent Slough Tendon Exposed: No Muscle Exposed: No Joint Exposed: No Bone Exposed: No Treatment Notes Wound #8 (Sacrum) Cleanser Wound Cleanser Discharge Instruction: Cleanse the wound with wound cleanser prior to applying a clean dressing using gauze sponges, not tissue or cotton balls. Peri-Wound Care Skin Prep Discharge Instruction: Use skin prep as directed Topical Primary Dressing KerraCel Ag Gelling Fiber Dressing, 2x2 in (silver alginate) Discharge Instruction: Apply silver alginate to wound bed as instructed Secondary Dressing ALLEVYN Gentle Border, 3x3 (in/in) Discharge Instruction: Apply over primary dressing as  directed. Secured With 29M Medipore H Soft Cloth Surgical T ape, 4 x 10 (in/yd) Discharge Instruction: Secure with tape as directed. Compression Wrap Compression Stockings Add-Ons Electronic Signature(s) Signed: 12/30/2021 4:37:26 PM By: Sharyn Creamer RN, BSN Entered By: Sharyn Creamer on 12/30/2021 13:06:28 -------------------------------------------------------------------------------- Vitals Details Patient Name: Date of Service: [name] DA REEGAN, MCTIGHE 12/30/2021 12:30 PM Medical Record Number: [id number] Patient Account Number: [id number] Date of Birth/Sex: Treating RN: 10-Sep-1980 (41 y.o. [name] Barnett Primary Care Frederich Montilla: Julianne Handler Other Clinician: Referring Elwin Tsou: Treating Naszir Cott/Extender: Eben Burow, Sharmin Weeks in Treatment: 5 Vital Signs Time Taken: 13:01 Temperature (F): 98.3 Height (in): 65 Pulse (bpm): 85 Weight (lbs): 170 Respiratory Rate (breaths/min): 16 Body Mass Index (BMI): 28.3 Blood Pressure (mmHg): 150/93 Reference Range: 80 - 120 mg / dl Electronic Signature(s) Signed: 12/30/2021 4:37:26 PM By: Sharyn Creamer RN, BSN Entered By: Sharyn Creamer on 12/30/2021 13:02:48

## 2022-01-02 DIAGNOSIS — N186 End stage renal disease: Secondary | ICD-10-CM | POA: Diagnosis not present

## 2022-01-02 DIAGNOSIS — Z992 Dependence on renal dialysis: Secondary | ICD-10-CM | POA: Diagnosis not present

## 2022-01-02 DIAGNOSIS — N2581 Secondary hyperparathyroidism of renal origin: Secondary | ICD-10-CM | POA: Diagnosis not present

## 2022-01-05 DIAGNOSIS — N186 End stage renal disease: Secondary | ICD-10-CM | POA: Diagnosis not present

## 2022-01-05 DIAGNOSIS — Z992 Dependence on renal dialysis: Secondary | ICD-10-CM | POA: Diagnosis not present

## 2022-01-05 DIAGNOSIS — N2581 Secondary hyperparathyroidism of renal origin: Secondary | ICD-10-CM | POA: Diagnosis not present

## 2022-01-07 DIAGNOSIS — Z992 Dependence on renal dialysis: Secondary | ICD-10-CM | POA: Diagnosis not present

## 2022-01-07 DIAGNOSIS — N2581 Secondary hyperparathyroidism of renal origin: Secondary | ICD-10-CM | POA: Diagnosis not present

## 2022-01-07 DIAGNOSIS — N186 End stage renal disease: Secondary | ICD-10-CM | POA: Diagnosis not present

## 2022-01-09 DIAGNOSIS — N2581 Secondary hyperparathyroidism of renal origin: Secondary | ICD-10-CM | POA: Diagnosis not present

## 2022-01-09 DIAGNOSIS — N186 End stage renal disease: Secondary | ICD-10-CM | POA: Diagnosis not present

## 2022-01-09 DIAGNOSIS — Z992 Dependence on renal dialysis: Secondary | ICD-10-CM | POA: Diagnosis not present

## 2022-01-11 ENCOUNTER — Ambulatory Visit (HOSPITAL_COMMUNITY): Payer: Medicare HMO | Attending: Specialist | Admitting: Physical Therapy

## 2022-01-11 ENCOUNTER — Encounter (HOSPITAL_COMMUNITY): Payer: Self-pay | Admitting: Physical Therapy

## 2022-01-11 DIAGNOSIS — G839 Paralytic syndrome, unspecified: Secondary | ICD-10-CM | POA: Diagnosis not present

## 2022-01-11 DIAGNOSIS — R262 Difficulty in walking, not elsewhere classified: Secondary | ICD-10-CM | POA: Insufficient documentation

## 2022-01-11 DIAGNOSIS — M6702 Short Achilles tendon (acquired), left ankle: Secondary | ICD-10-CM | POA: Diagnosis not present

## 2022-01-11 DIAGNOSIS — S82891A Other fracture of right lower leg, initial encounter for closed fracture: Secondary | ICD-10-CM | POA: Diagnosis not present

## 2022-01-11 DIAGNOSIS — M62469 Contracture of muscle, unspecified lower leg: Secondary | ICD-10-CM | POA: Diagnosis not present

## 2022-01-11 DIAGNOSIS — M6281 Muscle weakness (generalized): Secondary | ICD-10-CM | POA: Diagnosis not present

## 2022-01-11 DIAGNOSIS — R2689 Other abnormalities of gait and mobility: Secondary | ICD-10-CM | POA: Diagnosis not present

## 2022-01-11 DIAGNOSIS — M62452 Contracture of muscle, left thigh: Secondary | ICD-10-CM | POA: Insufficient documentation

## 2022-01-11 DIAGNOSIS — G8194 Hemiplegia, unspecified affecting left nondominant side: Secondary | ICD-10-CM | POA: Insufficient documentation

## 2022-01-11 DIAGNOSIS — R29898 Other symptoms and signs involving the musculoskeletal system: Secondary | ICD-10-CM | POA: Insufficient documentation

## 2022-01-11 NOTE — Therapy (Signed)
OUTPATIENT PHYSICAL THERAPY LOWER EXTREMITY EVALUATION   Patient Name: [name] Barnett MRN: [id number] DOB:10/14/1980, 41 y.o., female Today's Date: 01/11/2022   PT End of Session - 01/11/22 1102     Visit Number 1    Number of Visits 16    Date for PT Re-Evaluation 03/08/22    Authorization Type [employer] Medicare HMO (Medicaid [place] secondary)    Authorization Time Period Auth requested    Progress Note Due on Visit 10    PT Start Time 1105    PT Stop Time 1202    PT Time Calculation (min) 57 min    Equipment Utilized During Treatment Gait belt    Activity Tolerance Patient tolerated treatment well    Behavior During Therapy WFL for tasks assessed/performed             Past Medical History:  Diagnosis Date   Diabetes mellitus without complication ([place])    Hyperlipidemia    Hypertension    Renal disorder    Stroke [facility])    Vitamin D deficiency    Past Surgical History:  Procedure Laterality Date   cardiac stents     CATARACT EXTRACTION W/PHACO Left 08/29/2020   Procedure: CATARACT EXTRACTION PHACO AND INTRAOCULAR LENS PLACEMENT LEFT EYE;  Surgeon: Baruch Goldmann, MD;  Location: [facility];  Service: Ophthalmology;  Laterality: Left;  left CDE=29.30   CATARACT EXTRACTION W/PHACO Right 11/28/2020   Procedure: CATARACT EXTRACTION PHACO AND INTRAOCULAR LENS PLACEMENT (I[facility]);  Surgeon: Baruch Goldmann, MD;  Location: [facility];  Service: Ophthalmology;  Laterality: Right;  CDE 4.35   COLON SURGERY     COLOSTOMY     TUBAL LIGATION     wound on buttocks     Patient Active Problem List   Diagnosis Date Noted   Pressure injury of skin 10/01/2021   Chronic kidney disease (CKD) stage G5/A1, glomerular filtration rate (GFR) less than or equal to 15 mL/min/1.73 square meter and albuminuria creatinine ratio less than 30 mg/g (HCC) 06/22/2021   Dialysis patient ([place]) 06/22/2021   BV (bacterial vaginosis) 04/02/2020   Vaginal itching 04/02/2020   Vaginal odor 04/02/2020    Vaginal discharge 04/02/2020   Sepsis secondary to UTI ([place]) 01/13/2020   Acute respiratory failure with hypoxia ([place]) 01/13/2020   Right leg swelling 01/13/2020   Sacral decubitus ulcer 01/13/2020   Anemia due to end stage renal disease ([place]) 01/13/2020   Essential hypertension 01/13/2020   Hyperlipidemia 01/13/2020   Type 2 diabetes mellitus ([place]) 01/13/2020    PCP: Julianne Handler, DO  REFERRING PROVIDER: Basil Dess, MD  REFERRING DIAG: [phone] (ICD-10-CM) - Contracture of left hamstring M62.469,G83.9 (ICD-10-CM) - 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 Reports multiple medical complications following this event and has been in and out of SNF. Recently moved into assisted living facility ([place]). Saw her neurosurgeon last month, Dr. Sharol Given shortly before this. States she has developed contractures in her LLE, specifically the hamstring and achilles tendon and plans to have these lengthened surgically. She  can use an upright walker but does not walk much at her ALF, primarily using w/c right now. (Additionally, states her upright walker needs repair.) She states she is limited with walking due to back issues and the LLE contractures.   PERTINENT HISTORY: History of CVA, Lumbar discitis and end stage renal failure, dialysis dependent. Left side colostomy bag. PAIN:  Are you having pain? Yes: NPRS scale:  7/10 when walking; 0/10 currently at rest Pain location: LLE and back Pain description: aches Aggravating factors: walking Relieving factors: sitting  PRECAUTIONS: None  WEIGHT BEARING RESTRICTIONS No  FALLS:  Has patient fallen in last 6 months? No  LIVING ENVIRONMENT: Lives with: lives in an assisted living facility Lives in: Other ALF Stairs: No Has following equipment at home: Wheelchair (manual) and upright walker  OCCUPATION: n/a  PLOF:  Prior to CVA used a 4WW to ambulate.  PATIENT GOALS Improve balance and walking ability   OBJECTIVE:   PATIENT SURVEYS:  FOTO 43%  COGNITION:  Overall cognitive status: Within functional limits for tasks assessed     SENSATION: WFL  EDEMA:  Hx of lymphedema RLE  MUSCLE LENGTH: Hamstrings:  Left 60 deg from full knee extension (90/90 measurement) Thomas test:  Left 16 deg from neutral  POSTURE: rounded shoulders, forward head, and decreased lumbar lordosis    LOWER EXTREMITY ROM:  Active ROM Right eval Left eval  Hip flexion    Hip extension    Hip abduction    Hip adduction    Hip internal rotation    Hip external rotation    Knee flexion 133 123  Knee extension -12 -40  Ankle dorsiflexion 0 (-12 knee ext) (-10 knee flexed)  Ankle plantarflexion    Ankle inversion    Ankle eversion     (Blank rows = not tested)  LOWER EXTREMITY MMT:  MMT (Taken seated) Right eval Left eval  Hip flexion 4- 3  Hip extension 4 3-  Hip abduction 4 4-  Hip adduction    Hip internal rotation    Hip external rotation    Knee flexion 4 3+  Knee extension 4+ 4- (range limited)  Ankle dorsiflexion 4- 3-  Ankle plantarflexion    Ankle inversion    Ankle eversion     (Blank rows = not tested)    FUNCTIONAL TESTS:  Timed up and go (TUG): 91 sec with RW at CGA 2 minute walk test: 42 feet with RW and CGA; needed to sit at 1:35 sec  GAIT: Distance walked: 42  Assistive device utilized: Environmental consultant - 2 wheeled Level of  assistance: CGA Comments: Left hip and knee flexed throughout gait cycle, trunk flexed, Lt forefoot strikes at initial contact, lacks foot flat on Lt, lacks extension to neutral on Lt    TODAY'S TREATMENT: Eval MMT, 2WT, muscle length, TUG Education HEP   PATIENT EDUCATION:  Education details: Findings, POC, low load long duration stretch, PT role Person educated: Patient Education method: Explanation Education comprehension: verbalized understanding   HOME EXERCISE PROGRAM: Access Code: [email or web address] URL: [email or web address] Date: 01/11/2022 Prepared by: Candie Mile  Exercises - Seated Knee Extension Stretch with Chair (Mirrored)  - 3 x daily - 7 x weekly - 3 sets - 20-30 seconds hold - Seated Hamstring Stretch (Mirrored)  - 3 x daily - 7 x weekly - 3 sets - 20-30 hold - Seated Gastroc Stretch with Strap (Mirrored)  - 3 x daily - 7 x weekly - 3 sets - 20-30 hold - Seated  Knee Flexion Stretch (Mirrored)  - 3 x daily - 7 x weekly - 3 sets - 10 reps - 20-30 hold  ASSESSMENT:  CLINICAL IMPRESSION: Patient is a 41 y.o. female who was seen today for physical therapy evaluation and treatment for contractures of LLE secondary to hx of CVA and complex medical history. Patient presents with deficits including reduced muscle length, contractures, reduced strength, limited range of motion, decreased endurance, limited activity tolerance, abnormalities of gait, impaired balance, and pain, contributing to impaired functional mobility with ADLs and IADLs. Patient is currently restricted in ADLs as indicated by objective and subjective functional outcome measures, as well as reported history and objective measures taken during this exam. Patient will benefit from skilled physical therapy intervention in order to improve function and reduce the impairments listed above.    OBJECTIVE IMPAIRMENTS Abnormal gait, decreased activity tolerance, decreased balance, decreased endurance,  decreased knowledge of use of DME, decreased mobility, difficulty walking, decreased ROM, decreased strength, hypomobility, increased edema, increased fascial restrictions, impaired flexibility, impaired tone, and pain.   ACTIVITY LIMITATIONS carrying, lifting, bending, standing, squatting, stairs, transfers, and locomotion level  PARTICIPATION LIMITATIONS: meal prep, cleaning, laundry, driving, shopping, and community activity  [place], Time since onset of injury/illness/exacerbation, Transportation, and 3+ comorbidities: dialysis, CVA, colostomy, contractures  are also affecting patient's functional outcome.   REHAB POTENTIAL: Fair contractures are chronic  CLINICAL DECISION MAKING: Evolving/moderate complexity  EVALUATION COMPLEXITY: Moderate   GOALS: Goals reviewed with patient? Yes  SHORT TERM GOALS: Target date: 02/08/22  Patient will be independent with initial HEP and self-management strategies to improve functional outcomes Baseline: initiated Goal status: INITIAL     2. Patient will demonstrate 10  degree improvement in left hamstring length with 90/90 hamstring test to improve gait symmetry Baseline: lacks 60 degrees from full extension Goal status: INITIAL    LONG TERM GOALS: Target date: 03/08/22  Patient will be independent with advanced HEP and self-management strategies to improve functional outcomes Baseline: n/a Goal status: INITIAL  2.  Patient will improve FOTO score to predicted value of 57% to indicate improvement in functional outcomes Baseline: 43% Goal status: INITIAL  3.  Patient will have Lt knee AROM 0-120 degrees to improve functional mobility and facilitate squatting to pick up items from floor. Baseline: see above Goal status: INITIAL  4. Patient will have equal to or > 4+/5 MMT throughout BIL LEs to improve ability to perform functional mobility, stair ambulation and ADLs.  Baseline: see above Goal status: INITIAL  5.  Patient will be able to ambulate at least 100 feet during 2MWT with LRAD to demonstrate improved ability to perform functional mobility and associated tasks. Baseline: 42 feet with RW Goal status: INITIAL   6. Patient will improve TUG with LRAD <30 seconds to show significant improvement in functional strength with gait and transfers. Baseline: 91 seconds with RW Goal status: INITIAL  PLAN: PT FREQUENCY: 2x/week  PT DURATION: 8 weeks  PLANNED INTERVENTIONS: Therapeutic exercises, Therapeutic activity, Neuromuscular re-education, Balance training, Gait training, Patient/Family education, Self Care, Joint mobilization, Joint manipulation, Stair training, Orthotic/Fit training, DME instructions, Dry Needling, Electrical stimulation, Cryotherapy, Moist heat, Splintting, Taping, Ultrasound, Ionotophoresis 4mg /ml Dexamethasone, Manual therapy, and Re-evaluation  PLAN FOR NEXT SESSION: LLE ROM, progressive strengthening, gait training; Complete BERG for a baseline assessment;   Candie Mile, PT, DPT Physical Therapist Acute Rehabilitation Services [facility]  01/11/2022, 1:25 PM

## 2022-01-12 DIAGNOSIS — N186 End stage renal disease: Secondary | ICD-10-CM | POA: Diagnosis not present

## 2022-01-12 DIAGNOSIS — Z992 Dependence on renal dialysis: Secondary | ICD-10-CM | POA: Diagnosis not present

## 2022-01-12 DIAGNOSIS — N2581 Secondary hyperparathyroidism of renal origin: Secondary | ICD-10-CM | POA: Diagnosis not present

## 2022-01-13 ENCOUNTER — Encounter (HOSPITAL_BASED_OUTPATIENT_CLINIC_OR_DEPARTMENT_OTHER): Payer: No Typology Code available for payment source | Attending: General Surgery | Admitting: General Surgery

## 2022-01-13 DIAGNOSIS — E1140 Type 2 diabetes mellitus with diabetic neuropathy, unspecified: Secondary | ICD-10-CM | POA: Diagnosis not present

## 2022-01-13 DIAGNOSIS — N186 End stage renal disease: Secondary | ICD-10-CM | POA: Diagnosis not present

## 2022-01-13 DIAGNOSIS — Z992 Dependence on renal dialysis: Secondary | ICD-10-CM | POA: Diagnosis not present

## 2022-01-13 DIAGNOSIS — E1151 Type 2 diabetes mellitus with diabetic peripheral angiopathy without gangrene: Secondary | ICD-10-CM | POA: Insufficient documentation

## 2022-01-13 DIAGNOSIS — H42 Glaucoma in diseases classified elsewhere: Secondary | ICD-10-CM | POA: Diagnosis not present

## 2022-01-13 DIAGNOSIS — E1139 Type 2 diabetes mellitus with other diabetic ophthalmic complication: Secondary | ICD-10-CM | POA: Diagnosis not present

## 2022-01-13 DIAGNOSIS — I5032 Chronic diastolic (congestive) heart failure: Secondary | ICD-10-CM | POA: Insufficient documentation

## 2022-01-13 DIAGNOSIS — K219 Gastro-esophageal reflux disease without esophagitis: Secondary | ICD-10-CM | POA: Insufficient documentation

## 2022-01-13 DIAGNOSIS — S91115A Laceration without foreign body of left lesser toe(s) without damage to nail, initial encounter: Secondary | ICD-10-CM | POA: Diagnosis not present

## 2022-01-13 DIAGNOSIS — I132 Hypertensive heart and chronic kidney disease with heart failure and with stage 5 chronic kidney disease, or end stage renal disease: Secondary | ICD-10-CM | POA: Insufficient documentation

## 2022-01-13 DIAGNOSIS — E1122 Type 2 diabetes mellitus with diabetic chronic kidney disease: Secondary | ICD-10-CM | POA: Diagnosis not present

## 2022-01-13 DIAGNOSIS — G8222 Paraplegia, incomplete: Secondary | ICD-10-CM | POA: Insufficient documentation

## 2022-01-13 DIAGNOSIS — E11622 Type 2 diabetes mellitus with other skin ulcer: Secondary | ICD-10-CM | POA: Diagnosis present

## 2022-01-13 DIAGNOSIS — L89152 Pressure ulcer of sacral region, stage 2: Secondary | ICD-10-CM | POA: Diagnosis not present

## 2022-01-14 DIAGNOSIS — N186 End stage renal disease: Secondary | ICD-10-CM | POA: Diagnosis not present

## 2022-01-14 DIAGNOSIS — Z992 Dependence on renal dialysis: Secondary | ICD-10-CM | POA: Diagnosis not present

## 2022-01-14 DIAGNOSIS — N2581 Secondary hyperparathyroidism of renal origin: Secondary | ICD-10-CM | POA: Diagnosis not present

## 2022-01-14 NOTE — Progress Notes (Signed)
[name] HAN, LYSNE ([id number]) Visit Report for 01/13/2022 Chief Complaint Document Details Patient Name: Date of Service: [name] Barnett, [name] Barnett 01/13/2022 2:00 PM Medical Record Number: [id number] Patient Account Number: [id number] Date of Birth/Sex: Treating RN: 05-07-1980 (41 y.o. [name] Primary Care Provider: Julianne Handler Other Clinician: Referring Provider: Treating Provider/Extender: Eben Burow, Sharmin Weeks in Treatment: 7 Information Obtained from: Patient Chief Complaint 06/15/2019; patient is here for review of the wound on her lower sacrum 12/31/2020; patient is here for review of the wound on her right buttock 11/25/2021: Patient is here for evaluation and management of a stage II sacral pressure ulcer. Electronic Signature(s) Signed: 01/13/2022 2:48:46 PM By: Fredirick Maudlin MD FACS Entered By: Fredirick Maudlin on 01/13/2022 14:48:46 -------------------------------------------------------------------------------- HPI Details Patient Name: Date of Service: [name] Barnett, [name] Barnett 01/13/2022 2:00 PM Medical Record Number: [id number] Patient Account Number: [id number] Date of Birth/Sex: Treating RN: January 02, 1981 (41 y.o. [name] Primary Care Provider: Julianne Handler Other Clinician: Referring Provider: Treating Provider/Extender: Eben Burow, Sharmin Weeks in Treatment: 7 History of Present Illness HPI Description: Admission 06/15/2019 This is a 41 year old woman who comes to [place] from [facility] skilled facility in [facility]. I am able to follow a lot of her history on care everywhere in epic. The patient is a type II diabetic and has been on dialysis. 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 I am not exactly sure of the nature of the stroke at this present time. She has made a decent recovery and is now up walking with  a walker. In August 2020 she was admitted to [facility] in [place] with L4-L5 discitis. Culture and sensitivity showed Klebsiella. She was discharged on 8 weeks of ceftazidime. In mid September she is readmitted to the hospital with a pressure ulcer on her lower sacrum and necrotizing fasciitis. She required an extensive surgical debridement. CT scan of the pelvis at 1 point showed communication with the rectum. A colostomy was recommended and placed. Culture of this area showed Klebsiella and yeast. She was discharged on an extended course of meropenem and Diflucan. [facility] [place] We do not have any of these records. I think she was discharged to another nursing home but is come to [facility] skilled facility in [facility]. She was sent down here for review of the remaining wound. By review of the pictures that the patient had from September this is made considerable improvement. She tells me that the wound VAC was discontinued at the end of December 2020. Currently the facility is using a form of silver alginate to the wound surface. Past medical history type 2 diabetes with chronic renal failure on dialysis Tuesday Thursday and Saturday. CVA with left hemiparesis in June 2020 but she appears to be making a good recovery. Chronic diastolic heart failure, hypertension 43; patient's wound area slightly smaller. The area that was towards her anus has closed over. We are using Santyl covered with Hydrofera Blue. We have not been able to get her lab work apparently her veins are very difficult for phlebotomy. They are trying to get the blood work done where she dialyzes in [facility] but that requires some logistical issues. 2/26; not much change in the wound. Still a nonviable surface we have been using Santyl and Hydrofera Blue. I changed her to [facility] only. She tells me she has rigorously offloading  this area she also complains of some pain 3/5; still no change here. I switch to pure Hydrofera Blue. She still complains of a lot of pain although is a big wound it seems out of proportion to what I might expect. This was initially a surgical wound secondary to necrotizing wound infection. She is on dialysis. Other than that she thinks she is offloading this fairly rigorously. She has not been systemically unwell 3/26; I switched to Iodoflex 3 weeks ago. This really seems to have helped, much better surface and slight improvement in the surface area. 4/26; the patient is still on Iodoflex she missed an appointment 2 weeks ago. Doing slightly smaller. She is on dialysis 5/10; using Hydrofera Blue. The major improvement here is the complete lack of depth. She has islands of epithelialization 09/24/19-Patient comes in at 2 weeks, dimensions about the same, there is an [place] of epithelium at margin of wound, no slough noted 10/15/2019 upon evaluation today patient appears to be doing well with regard to her wound. She has been tolerating the dressing changes in the sacral region without complication and at this point she seems to be healing quite nicely. I am extremely pleased with how things are progressing and the epithelial tissue seems to be spreading from the central portion as well which is also excellent. 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 We have been using Hydrofera Blue. The smaller area which has depth at 0.7 cm there is a question about whether they have been packing anything into this or just flaring Hydrofera Blue over the top 9/23; its been more than 2 months since this patient was here. She still has a small area distally which is the remanent of her stage IV wound. They have been using Hydrofera Blue. She also has an area on the left gluteal 10/22; 1 month follow-up. I think the original sacral wound is closed however  the patient has 2 areas distally 1 over the coccyx a small pinpoint area and the other into the gluteal cleft. Neither 1 of these looks particularly infected. I thought they might be connected but they were not. 11/15; the original sacral wound is closed patient only has 1 remaining area. 1 of these is in the gluteal cleft just below the coccyx. The area over the coccyx from last time I think is all so closed 12/31/2020; this is a patient that we saw for a pressure ulcer over her lower sacrum and coccyx for a long period of time in 2021. She eventually healed over. She is at [facility] skilled facility largely secondary to a remote CVA she had. She is also end-stage renal disease on dialysis secondary to type 2 diabetes. She tells [place] that since about mid June she has had an area more distally than the original sacrum/coccyx wound it looks as though they are putting silver alginate in this area. She is limited in her ability to offload this I think spending most of her time in a wheelchair although she can stand to transfer. She has an ostomy During her original stay here in 2021 I also list that she had a more distal area into her perineum that closed over well before the more proximal coccyx/sacral wound. She had a CT scan of the abdomen and pelvis on 12/01/2020. This did note first chronic sacral decubitus along the right aspect of her distal sacrum and coccyx. There was not felt to be bone  destruction she has chronic changes of bilateral sacroiliitis and chronic endplate destruction and sclerosis at L4-L5 secondary to previous osteomyelitis however no new findings were felt to be seen in terms of bone problems in this area 01/14/2021; 2-week follow-up. Her wound is smaller essentially in the coccyx area. We have been using silver collagen. 10/5; 3-week follow-up. The patient returns with her area in the coccyx healed however distally in the perineum there is a fairly long stage II area. I am assuming  this patient's wound is a pressure related area. Although this would not be a usual pressure area for a normal situation her immobility secondary to remote CVA, dialysis etc. make her at risk for pressure areas in an otherwise unusual setting. The patient is fairly upset stating that we were told about this last time. I do not have this in my records. Her original wound was small and just about healed at that point and this visit was arranged in follow-up. If there was a new wound more distally into her perineum we did not look at it 11/2; patient's wound is slightly smaller. This is in the gluteal cleft on the left. Using silver alginate. She tells me she is aggressively offloading this area. 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 We have been using silver alginate. Arrives in clinic today with the wound larger. 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 We have been using silver alginate. PCR culture I did last week was polymicrobial with multiple organisms we put Polysporin under the silver alginate at the facility. According to the patient this got started 05/13/2021; the wound looks slightly smaller. This is in the gluteal cleft on the left. She is using silver alginate with topical antibiotics underneath. The topical antibiotics relate to a deep tissue culture I did some weeks ago. This showed a lawn of low titer bacteria. I am not sure how much she is offloading this area both at dialysis and during the day at the facility would be the other issue 2/8; continued nice improvement the wound is smaller. This is on the gluteal cleft on the left. She is using topical antibiotics/triple antibiotic with silver alginate. 07/08/2021: The wound in her  perineum continues to improve. Daily dressing changes with triple antibiotic and silver alginate are being employed. There is a small amount of slough at the base. A new problem today is that somehow she suffered a traumatic laceration to her left fourth toe. It is bleeding briskly in clinic. She is insensate in that area and has no idea how the injury occurred. 07/14/2021: The perineal wound looks a bit better, but unfortunately, the sacral wound that was just cranial to this has reopened. It is stage II with minimal debris and slough. The traumatic laceration to her left fourth toe has a gray thick slough. She denies any fevers, chills, or other concerns for systemic infection. 07/24/2021: Both the perineal and sacral wounds appear roughly the same today with minimal slough. The traumatic laceration to her left fourth toe looks a bit worse with necrosis of muscle and exposed tendon. No fevers or chills or any concern for systemic infection. 07/31/2021: Both perineal and sacral wounds are perhaps a little bit smaller and are very clean today.  The traumatic laceration to her left fourth toe is markedly improved. No necrotic tissue was appreciated. She apparently had her x-ray performed at [facility] but it was not forwarded to [place] for review of the results. It appears that her infectious disease doctor has also referred her to podiatry because of the toe. 08/14/2021: The perineal wound is about the same size, but the sacral wound has contracted quite substantially. The traumatic laceration to her left fourth toe has made the most dramatic improvement. Minimal slough and the wound is closing up. We have been using Santyl and Hydrofera Blue on the toe and silver alginate to both the sacral and perineal wounds. 08/26/2021: The wound on her toe is nearly closed. The perineal wound is stable and the sacral wound is smaller. No concern for infection. READMISSION 11/25/2021 After our last visit, the patient left  her skilled nursing facility. She has not been seen or received any significant wound care since then. Her toe is healed and her perineal wound has also epithelialized. She just has a small stage II sacral ulcer. The surface has slough accumulation and there is some heaped up senescent skin around the borders. 12/16/2021: The ulcer is smaller today with just 2 1-2 mm or so sized openings. There is eschar and some senescent skin around the borders. 12/30/2021: The wound is unchanged from her last visit. Apparently she has not been receiving home health services. There is a little slough and senescent skin once again. 01/13/2022: The wound is almost completely epithelialized. She did not have any dressing in place when she arrived and she reports that she is not receiving home health services. Electronic Signature(s) Signed: 01/13/2022 2:49:26 PM By: Fredirick Maudlin MD FACS Entered By: Fredirick Maudlin on 01/13/2022 14:49:26 -------------------------------------------------------------------------------- Physical Exam Details Patient Name: Date of Service: [name] Barnett, [name] Barnett 01/13/2022 2:00 PM Medical Record Number: [id number] Patient Account Number: [id number] Date of Birth/Sex: Treating RN: 1981-01-26 (41 y.o. [name] Primary Care Provider: Julianne Handler Other Clinician: Referring Provider: Treating Provider/Extender: Eben Burow, Sharmin Weeks in Treatment: 7 Constitutional Slightly hypertensive. . . . No acute distress.Marland Kitchen Respiratory Normal work of breathing on room air.. Notes 01/13/2022: The wound is nearly completely epithelialized but has not completely closed. Electronic Signature(s) Signed: 01/13/2022 2:50:36 PM By: Fredirick Maudlin MD FACS Entered By: Fredirick Maudlin on 01/13/2022 14:50:36 -------------------------------------------------------------------------------- Physician Orders Details Patient Name: Date of Service: [name] DA  Barnett, [name] Barnett 01/13/2022 2:00 PM Medical Record Number: [id number] Patient Account Number: [id number] Date of Birth/Sex: Treating RN: 1980/07/10 (41 y.o. Donalda Ewings Primary Care Provider: Julianne Handler Other Clinician: Referring Provider: Treating Provider/Extender: Eben Burow, Sharmin Weeks in Treatment: 7 Verbal / Phone Orders: No Diagnosis Coding ICD-10 Coding Code Description L89.152 Pressure ulcer of sacral region, stage 2 E11.622 Type 2 diabetes mellitus with other skin ulcer N18.6 End stage renal disease Z99.2 Dependence on renal dialysis Follow-up Appointments Return appointment in 1 month. - Dr Celine Ahr - Room 1 Bathing/ Shower/ Hygiene May shower with protection but do not get wound dressing(s) wet. Off-Loading Turn and reposition every 2 hours [place] wound care orders this week; continue Home Health for wound care. May utilize formulary equivalent dressing for wound treatment orders unless otherwise specified. Dressing changes to be completed by [place] on Monday / Wednesday / Friday except when patient has scheduled visit at [facility]. Other Home Health Orders/Instructions: - [facility]ound [facility] Wound #8 - Sacrum Cleanser: Wound Cleanser 1 x Per Day/30 Days  Discharge Instructions: Cleanse the wound with wound cleanser prior to applying a clean dressing using gauze sponges, not tissue or cotton balls. Peri-Wound Care: Skin Prep 1 x Per Day/30 Days Discharge Instructions: Use skin prep as directed Prim Dressing: KerraCel Ag Gelling Fiber Dressing, 2x2 in (silver alginate) 1 x Per Day/30 Days ary Discharge Instructions: Apply silver alginate to wound bed as instructed Secondary Dressing: ALLEVYN Gentle Border, 3x3 (in/in) 1 x Per Day/30 Days Discharge Instructions: Apply over primary dressing as directed. Secured With: 52M Medipore H Soft Cloth Surgical T ape, 4 x 10 (in/yd) (Generic) 1 x Per Day/30  Days Discharge Instructions: Secure with tape as directed. Electronic Signature(s) Signed: 01/13/2022 2:52:30 PM By: Fredirick Maudlin MD FACS Entered By: Fredirick Maudlin on 01/13/2022 14:50:49 -------------------------------------------------------------------------------- Problem List Details Patient Name: Date of Service: [name] Barnett, [name] Barnett 01/13/2022 2:00 PM Medical Record Number: [id number] Patient Account Number: [id number] Date of Birth/Sex: Treating RN: [birth year] (41 y.o. [name] Primary Care Provider: Julianne Handler Other Clinician: Referring Provider: Treating Provider/Extender: Eben Burow, Sharmin Weeks in Treatment: 7 Active Problems ICD-10 Encounter Code Description Active Date MDM Diagnosis L89.152 Pressure ulcer of sacral region, stage 2 11/25/2021 No Yes E11.622 Type 2 diabetes mellitus with other skin ulcer 11/25/2021 No Yes N18.6 End stage renal disease 11/25/2021 No Yes Z99.2 Dependence on renal dialysis 11/25/2021 No Yes Inactive Problems Resolved Problems Electronic Signature(s) Signed: 01/13/2022 2:47:55 PM By: Fredirick Maudlin MD FACS Entered By: Fredirick Maudlin on 01/13/2022 14:47:55 -------------------------------------------------------------------------------- Progress Note Details Patient Name: Date of Service: [name] Barnett, [name] Barnett 01/13/2022 2:00 PM Medical Record Number: [id number] Patient Account Number: [id number] Date of Birth/Sex: Treating RN: 1980/07/04 (41 y.o. [name] Primary Care Provider: Julianne Handler Other Clinician: Referring Provider: Treating Provider/Extender: Eben Burow, Sharmin Weeks in Treatment: 7 Subjective Chief Complaint Information obtained from Patient 06/15/2019; patient is here for review of the wound on her lower sacrum 12/31/2020; patient is here for review of the wound on her right buttock 11/25/2021: Patient is here for evaluation and  management of a stage II sacral pressure ulcer. History of Present Illness (HPI) Admission 06/15/2019 This is a 41 year old woman who comes to [place] from [facility] skilled facility in [facility]. I am able to follow a lot of her history on care everywhere in epic. The patient is a type II diabetic and has been on dialysis. 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 I am not exactly sure of the nature of the stroke at this present time. She has made a decent recovery and is now up walking with a walker. In August 2020 she was admitted to [facility] in [place] with L4-L5 discitis. Culture and sensitivity showed Klebsiella. She was discharged on 8 weeks of ceftazidime. In mid September she is readmitted to the hospital with a pressure ulcer on her lower sacrum and necrotizing fasciitis. She required an extensive surgical debridement. CT scan of the pelvis at 1 point showed communication with the rectum. A colostomy was recommended and placed. Culture of this area showed Klebsiella and yeast. She was discharged on an extended course of meropenem and Diflucan. [facility] [place] We do not have any of these records. I think she was discharged to another nursing home but is come to [facility] skilled facility in [facility]. She was sent down here for review of the remaining wound. By review  of the pictures that the patient had from September this is made considerable improvement. She tells me that the wound VAC was discontinued at the end of December 2020. Currently the facility is using a form of silver alginate to the wound surface. Past medical history type 2 diabetes with chronic renal failure on dialysis Tuesday Thursday and Saturday. CVA with left hemiparesis in June 2020 but she appears to be making a good recovery. Chronic diastolic heart failure,  hypertension 43; patient's wound area slightly smaller. The area that was towards her anus has closed over. We are using Santyl covered with Hydrofera Blue. We have not been able to get her lab work apparently her veins are very difficult for phlebotomy. They are trying to get the blood work done where she dialyzes in [facility] but that requires some logistical issues. 2/26; not much change in the wound. Still a nonviable surface we have been using Santyl and Hydrofera Blue. I changed her to [facility] only. She tells me she has rigorously offloading this area she also complains of some pain 3/5; still no change here. I switch to pure Hydrofera Blue. She still complains of a lot of pain although is a big wound it seems out of proportion to what I might expect. This was initially a surgical wound secondary to necrotizing wound infection. She is on dialysis. Other than that she thinks she is offloading this fairly rigorously. She has not been systemically unwell 3/26; I switched to Iodoflex 3 weeks ago. This really seems to have helped, much better surface and slight improvement in the surface area. 4/26; the patient is still on Iodoflex she missed an appointment 2 weeks ago. Doing slightly smaller. She is on dialysis 5/10; using Hydrofera Blue. The major improvement here is the complete lack of depth. She has islands of epithelialization 09/24/19-Patient comes in at 2 weeks, dimensions about the same, there is an [place] of epithelium at margin of wound, no slough noted 10/15/2019 upon evaluation today patient appears to be doing well with regard to her wound. She has been tolerating the dressing changes in the sacral region without complication and at this point she seems to be healing quite nicely. I am extremely pleased with how things are progressing and the epithelial tissue seems to be spreading from the central portion as well which is also excellent. 7/12; the patient still has 2  areas of this originally large stage IV wound in the lower sacrum/coccyx. We have been using Hydrofera Blue. The smaller area which has depth at 0.7 cm there is a question about whether they have been packing anything into this or just flaring Hydrofera Blue over the top 9/23; its been more than 2 months since this patient was here. She still has a small area distally which is the remanent of her stage IV wound. They have been using Hydrofera Blue. She also has an area on the left gluteal 10/22; 1 month follow-up. I think the original sacral wound is closed however the patient has 2 areas distally 1 over the coccyx a small pinpoint area and the other into the gluteal cleft. Neither 1 of these looks particularly infected. I thought they might be connected but they were not. 11/15; the original sacral wound is closed patient only has 1 remaining area. 1 of these is in the gluteal cleft just below the coccyx. The area over the coccyx from last time I think is all so closed 12/31/2020; this is a patient that we  saw for a pressure ulcer over her lower sacrum and coccyx for a long period of time in 2021. She eventually healed over. She is at [facility] skilled facility largely secondary to a remote CVA she had. She is also end-stage renal disease on dialysis secondary to type 2 diabetes. She tells [place] that since about mid June she has had an area more distally than the original sacrum/coccyx wound it looks as though they are putting silver alginate in this area. She is limited in her ability to offload this I think spending most of her time in a wheelchair although she can stand to transfer. She has an ostomy During her original stay here in 2021 I also list that she had a more distal area into her perineum that closed over well before the more proximal coccyx/sacral wound. She had a CT scan of the abdomen and pelvis on 12/01/2020. This did note first chronic sacral decubitus along the right aspect of her  distal sacrum and coccyx. There was not felt to be bone destruction she has chronic changes of bilateral sacroiliitis and chronic endplate destruction and sclerosis at L4-L5 secondary to previous osteomyelitis however no new findings were felt to be seen in terms of bone problems in this area 01/14/2021; 2-week follow-up. Her wound is smaller essentially in the coccyx area. We have been using silver collagen. 10/5; 3-week follow-up. The patient returns with her area in the coccyx healed however distally in the perineum there is a fairly long stage II area. I am assuming this patient's wound is a pressure related area. Although this would not be a usual pressure area for a normal situation her immobility secondary to remote CVA, dialysis etc. make her at risk for pressure areas in an otherwise unusual setting. The patient is fairly upset stating that we were told about this last time. I do not have this in my records. Her original wound was small and just about healed at that point and this visit was arranged in follow-up. If there was a new wound more distally into her perineum we did not look at it 11/2; patient's wound is slightly smaller. This is in the gluteal cleft on the left. Using silver alginate. She tells me she is aggressively offloading this area. 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 We have been using silver alginate. Arrives in clinic today with the wound larger. 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 We have been using silver alginate. PCR culture I did last week was polymicrobial with multiple organisms we put Polysporin under the silver alginate at the facility. According to the patient this got started 05/13/2021; the wound  looks slightly smaller. This is in the gluteal cleft on the left. She is using silver alginate with topical antibiotics underneath. The topical antibiotics relate to a deep tissue culture I did some weeks ago. This showed a lawn of low titer bacteria. I am not sure how much she is offloading this area both at dialysis and during the day at the facility would be the other issue 2/8; continued nice improvement the wound is smaller. This is on the gluteal cleft on the left. She is using topical antibiotics/triple antibiotic with silver alginate. 07/08/2021: The wound in her  perineum continues to improve. Daily dressing changes with triple antibiotic and silver alginate are being employed. There is a small amount of slough at the base. A new problem today is that somehow she suffered a traumatic laceration to her left fourth toe. It is bleeding briskly in clinic. She is insensate in that area and has no idea how the injury occurred. 07/14/2021: The perineal wound looks a bit better, but unfortunately, the sacral wound that was just cranial to this has reopened. It is stage II with minimal debris and slough. The traumatic laceration to her left fourth toe has a gray thick slough. She denies any fevers, chills, or other concerns for systemic infection. 07/24/2021: Both the perineal and sacral wounds appear roughly the same today with minimal slough. The traumatic laceration to her left fourth toe looks a bit worse with necrosis of muscle and exposed tendon. No fevers or chills or any concern for systemic infection. 07/31/2021: Both perineal and sacral wounds are perhaps a little bit smaller and are very clean today. The traumatic laceration to her left fourth toe is markedly improved. No necrotic tissue was appreciated. She apparently had her x-ray performed at [facility] but it was not forwarded to [place] for review of the results. It appears that her infectious disease doctor has also referred her to podiatry  because of the toe. 08/14/2021: The perineal wound is about the same size, but the sacral wound has contracted quite substantially. The traumatic laceration to her left fourth toe has made the most dramatic improvement. Minimal slough and the wound is closing up. We have been using Santyl and Hydrofera Blue on the toe and silver alginate to both the sacral and perineal wounds. 08/26/2021: The wound on her toe is nearly closed. The perineal wound is stable and the sacral wound is smaller. No concern for infection. READMISSION 11/25/2021 After our last visit, the patient left her skilled nursing facility. She has not been seen or received any significant wound care since then. Her toe is healed and her perineal wound has also epithelialized. She just has a small stage II sacral ulcer. The surface has slough accumulation and there is some heaped up senescent skin around the borders. 12/16/2021: The ulcer is smaller today with just 2 1-2 mm or so sized openings. There is eschar and some senescent skin around the borders. 12/30/2021: The wound is unchanged from her last visit. Apparently she has not been receiving home health services. There is a little slough and senescent skin once again. 01/13/2022: The wound is almost completely epithelialized. She did not have any dressing in place when she arrived and she reports that she is not receiving home health services. Patient History Information obtained from Patient, Chart. Family History Diabetes - Maternal Grandparents, Heart Disease - Mother,Father, Hypertension - Maternal Grandparents, Kidney Disease - Maternal Grandparents, Lung Disease - Mother, Stroke - Mother,Father, Thyroid Problems - Mother,Siblings, No family history of Cancer, Hereditary Spherocytosis, Seizures, Tuberculosis. Social History Never smoker, Marital Status - Separated, Alcohol Use - Never, Drug Use - No History, Caffeine Use - Daily - coffee. Medical History Eyes Patient has  history of Cataracts, Glaucoma Ear/Nose/Mouth/Throat Denies history of Chronic sinus problems/congestion, Middle ear problems Hematologic/Lymphatic Patient has history of Anemia Cardiovascular Patient has history of Congestive Heart Failure, Hypertension Denies history of Angina, Arrhythmia, Coronary Artery Disease, Deep Vein Thrombosis, Hypotension, Myocardial Infarction, Peripheral Arterial Disease, Peripheral Venous Disease, Phlebitis, Vasculitis Endocrine Patient has history of Type II Diabetes Genitourinary Patient has history of  End Stage Renal Disease - HD Integumentary (Skin) Denies history of History of Burn Musculoskeletal Denies history of Gout, Rheumatoid Arthritis, Osteoarthritis, Osteomyelitis Neurologic Patient has history of Neuropathy Denies history of Dementia, Quadriplegia, Paraplegia, Seizure Disorder Oncologic Denies history of Received Chemotherapy, Received Radiation Psychiatric Patient has history of Confinement Anxiety Denies history of Anorexia/bulimia Hospitalization/Surgery History - right arm AV fistula. - cardiac cath with stents. - sacral wound debridement. - left knee surgery. - catract extraction. - colostomy. Medical A Surgical History Notes nd Cardiovascular hyperlipidemia Gastrointestinal GERD, diverting colostomy Musculoskeletal left side weakness Neurologic CVA left mild hemiplegia, lumbar discitis Psychiatric depression, anxiety Objective Constitutional Slightly hypertensive. No acute distress.. Vitals Time Taken: 2:16 PM, Height: 65 in, Weight: 170 lbs, BMI: 28.3, Temperature: 98.8 F, Pulse: 73 bpm, Respiratory Rate: 16 breaths/min, Blood Pressure: 149/90 mmHg. Respiratory Normal work of breathing on room air.. General Notes: 01/13/2022: The wound is nearly completely epithelialized but has not completely closed. Integumentary (Hair, Skin) Wound #8 status is Open. Original cause of wound was Pressure Injury. The date acquired  was: 07/15/2021. The wound has been in treatment 26 weeks. The wound is located on the Sacrum. The wound measures 0.2cm length x 0.2cm width x 0.2cm depth; 0.031cm^2 area and 0.006cm^3 volume. There is Fat Layer (Subcutaneous Tissue) exposed. There is no tunneling or undermining noted. There is a medium amount of serosanguineous drainage noted. The wound margin is distinct with the outline attached to the wound base. There is large (67-100%) pink granulation within the wound bed. There is a small (1-33%) amount of necrotic tissue within the wound bed including Adherent Slough. Assessment Active Problems ICD-10 Pressure ulcer of sacral region, stage 2 Type 2 diabetes mellitus with other skin ulcer End stage renal disease Dependence on renal dialysis Plan Follow-up Appointments: Return appointment in 1 month. - Dr Celine Ahr - Room 1 Bathing/ Shower/ Hygiene: May shower with protection but do not get wound dressing(s) wet. Off-Loading: Turn and reposition every 2 hours Home Health: New wound care orders this week; continue Home Health for wound care. May utilize formulary equivalent dressing for wound treatment orders unless otherwise specified. Dressing changes to be completed by [place] on Monday / Wednesday / Friday except when patient has scheduled visit at [facility]. Other Home Health Orders/Instructions: - [facility] WOUND #8: - Sacrum Wound Laterality: Cleanser: Wound Cleanser 1 x Per Day/30 Days Discharge Instructions: Cleanse the wound with wound cleanser prior to applying a clean dressing using gauze sponges, not tissue or cotton balls. Peri-Wound Care: Skin Prep 1 x Per Day/30 Days Discharge Instructions: Use skin prep as directed Prim Dressing: KerraCel Ag Gelling Fiber Dressing, 2x2 in (silver alginate) 1 x Per Day/30 Days ary Discharge Instructions: Apply silver alginate to wound bed as instructed Secondary Dressing: ALLEVYN Gentle Border, 3x3 (in/in) 1 x Per Day/30  Days Discharge Instructions: Apply over primary dressing as directed. Secured With: 19M Medipore H Soft Cloth Surgical T ape, 4 x 10 (in/yd) (Generic) 1 x Per Day/30 Days Discharge Instructions: Secure with tape as directed. 01/13/2022: The wound is nearly completely epithelialized but has not completely closed. No debridement was necessary today. We will apply silver alginate and a foam border dressing. Per the patient's request, she will follow-up in 1 month. Electronic Signature(s) Signed: 01/13/2022 2:51:12 PM By: Fredirick Maudlin MD FACS Entered By: Fredirick Maudlin on 01/13/2022 14:51:12 -------------------------------------------------------------------------------- HxROS Details Patient Name: Date of Service: [name] Barnett, [name] Barnett 01/13/2022 2:00 PM Medical Record Number: [id number] Patient Account Number: [id number]  Date of Birth/Sex: Treating RN: Jun 29, 1980 (41 y.o. [name] Primary Care Provider: Julianne Handler Other Clinician: Referring Provider: Treating Provider/Extender: Eben Burow, Sharmin Weeks in Treatment: 7 Information Obtained From Patient Chart Eyes Medical History: Positive for: Cataracts; Glaucoma Ear/Nose/Mouth/Throat Medical History: Negative for: Chronic sinus problems/congestion; Middle ear problems Hematologic/Lymphatic Medical History: Positive for: Anemia Cardiovascular Medical History: Positive for: Congestive Heart Failure; Hypertension Negative for: Angina; Arrhythmia; Coronary Artery Disease; Deep Vein Thrombosis; Hypotension; Myocardial Infarction; Peripheral Arterial Disease; Peripheral Venous Disease; Phlebitis; Vasculitis Past Medical History Notes: hyperlipidemia Gastrointestinal Medical History: Past Medical History Notes: GERD, diverting colostomy Endocrine Medical History: Positive for: Type II Diabetes Time with diabetes: 48 yrs Treated with: Insulin Blood sugar tested every day: Yes Tested  : 3 times per day Genitourinary Medical History: Positive for: End Stage Renal Disease - HD Integumentary (Skin) Medical History: Negative for: History of Burn Musculoskeletal Medical History: Negative for: Gout; Rheumatoid Arthritis; Osteoarthritis; Osteomyelitis Past Medical History Notes: left side weakness Neurologic Medical History: Positive for: Neuropathy Negative for: Dementia; Quadriplegia; Paraplegia; Seizure Disorder Past Medical History Notes: CVA left mild hemiplegia, lumbar discitis Oncologic Medical History: Negative for: Received Chemotherapy; Received Radiation Psychiatric Medical History: Positive for: Confinement Anxiety Negative for: Anorexia/bulimia Past Medical History Notes: depression, anxiety HBO Extended History Items Eyes: Eyes: Cataracts Glaucoma Immunizations Pneumococcal Vaccine: Received Pneumococcal Vaccination: Yes Received Pneumococcal Vaccination On or After 60th Birthday: No Implantable Devices Yes Hospitalization / Surgery History Type of Hospitalization/Surgery right arm AV fistula cardiac cath with stents sacral wound debridement left knee surgery catract extraction colostomy Family and Social History Cancer: No; Diabetes: Yes - Maternal Grandparents; Heart Disease: Yes - Mother,Father; Hereditary Spherocytosis: No; Hypertension: Yes - Maternal Grandparents; Kidney Disease: Yes - Maternal Grandparents; Lung Disease: Yes - Mother; Seizures: No; Stroke: Yes - Mother,Father; Thyroid Problems: Yes - Mother,Siblings; Tuberculosis: No; Never smoker; Marital Status - Separated; Alcohol Use: Never; Drug Use: No History; Caffeine Use: Daily - coffee; Financial Concerns: No; Food, Clothing or Shelter Needs: No; Support System Lacking: No; Transportation Concerns: No Electronic Signature(s) Signed: 01/13/2022 2:52:30 PM By: Fredirick Maudlin MD FACS Signed: 01/14/2022 5:22:18 PM By: [name] RN, BSN Entered By: Fredirick Maudlin on  01/13/2022 14:49:33 -------------------------------------------------------------------------------- [place] Details Patient Name: Date of Service: [name] Barnett, [name] Barnett 01/13/2022 Medical Record Number: [id number] Patient Account Number: [id number] Date of Birth/Sex: Treating RN: 12-05-1980 (41 y.o. Donalda Ewings Primary Care Provider: Julianne Handler Other Clinician: Referring Provider: Treating Provider/Extender: Eben Burow, Sharmin Weeks in Treatment: 7 Diagnosis Coding ICD-10 Codes Code Description [phone] Pressure ulcer of sacral region, stage 2 E11.622 Type 2 diabetes mellitus with other skin ulcer N18.6 End stage renal disease Z99.2 Dependence on renal dialysis Facility Procedures CPT4 Code: [id number] Description: [phone] - WOUND CARE VISIT-LEV 2 EST PT Modifier: 25 Quantity: 1 Physician Procedures : CPT4 Code Description Modifier [id number] - WC PHYS LEVEL 3 - EST PT ICD-10 Diagnosis Description L89.152 Pressure ulcer of sacral region, stage 2 E11.622 Type 2 diabetes mellitus with other skin ulcer N18.6 End stage renal disease Z99.2 Dependence  on renal dialysis Quantity: 1 Electronic Signature(s) Signed: 01/13/2022 2:51:26 PM By: Fredirick Maudlin MD FACS Entered By: Fredirick Maudlin on 01/13/2022 14:51:26

## 2022-01-14 NOTE — Progress Notes (Signed)
[name] Barnett, HADDAWAY ([id number]) Visit Report for 01/13/2022 Arrival Information Details Patient Name: Date of Service: [name] Barnett, MCBROOM 01/13/2022 2:00 PM Medical Record Number: [id number] Patient Account Number: [id number] Date of Birth/Sex: Treating RN: 1980-08-28 (41 y.o. Donalda Ewings Primary Care Oneill Bais: Julianne Handler Other Clinician: Referring Clemence Stillings: Treating Hussam Muniz/Extender: Eben Burow, Sharmin Weeks in Treatment: 7 Visit Information History Since Last Visit Added or deleted any medications: No Patient Arrived: Wheel Chair Any new allergies or adverse reactions: No Arrival Time: 14:13 Had a fall or experienced change in No Accompanied By: self activities of daily living that may affect Transfer Assistance: None risk of falls: Patient Identification Verified: Yes Signs or symptoms of abuse/neglect since last visito No Secondary Verification Process Completed: Yes Hospitalized since last visit: No Implantable device outside of the clinic excluding No cellular tissue based products placed in the center since last visit: Has Dressing in Place as Prescribed: Yes Pain Present Now: Yes Electronic Signature(s) Signed: 01/13/2022 3:40:40 PM By: Sharyn Creamer RN, BSN Entered By: Sharyn Creamer on 01/13/2022 14:16:15 -------------------------------------------------------------------------------- Clinic Level of Care Assessment Details Patient Name: Date of Service: [name] Barnett, [place] 01/13/2022 2:00 PM Medical Record Number: [id number] Patient Account Number: [id number] Date of Birth/Sex: Treating RN: June 16, 1980 (41 y.o. Donalda Ewings Primary Care Kwynn Schlotter: Julianne Handler Other Clinician: Referring Kahli Fitzgerald: Treating Rolena Knutson/Extender: Eben Burow, Sharmin Weeks in Treatment: 7 Clinic Level of Care Assessment Items TOOL 4 Quantity Score X- 1 0 Use when only an EandM is performed on FOLLOW-UP  visit ASSESSMENTS - Nursing Assessment / Reassessment X- 1 10 Reassessment of Co-morbidities (includes updates in patient status) X- 1 5 Reassessment of Adherence to Treatment Plan ASSESSMENTS - Wound and Skin A ssessment / Reassessment X - Simple Wound Assessment / Reassessment - one wound 1 5 []  - 0 Complex Wound Assessment / Reassessment - multiple wounds []  - 0 Dermatologic / Skin Assessment (not related to wound area) ASSESSMENTS - Focused Assessment []  - 0 Circumferential Edema Measurements - multi extremities []  - 0 Nutritional Assessment / Counseling / Intervention []  - 0 Lower Extremity Assessment (monofilament, tuning fork, pulses) []  - 0 Peripheral Arterial Disease Assessment (using hand held doppler) ASSESSMENTS - Ostomy and/or Continence Assessment and Care []  - 0 Incontinence Assessment and Management []  - 0 Ostomy Care Assessment and Management (repouching, etc.) PROCESS - Coordination of Care X - Simple Patient / Family Education for ongoing care 1 15 []  - 0 Complex (extensive) Patient / Family Education for ongoing care X- 1 10 Staff obtains Programmer, systems, Records, T Results / Process Orders est []  - 0 Staff telephones HHA, Nursing Homes / Clarify orders / etc []  - 0 Routine Transfer to another Facility (non-emergent condition) []  - 0 Routine Hospital Admission (non-emergent condition) []  - 0 New Admissions / Biomedical engineer / Ordering NPWT Apligraf, etc. , []  - 0 Emergency Hospital Admission (emergent condition) []  - 0 Simple Discharge Coordination []  - 0 Complex (extensive) Discharge Coordination PROCESS - Special Needs []  - 0 Pediatric / Minor Patient Management []  - 0 Isolation Patient Management []  - 0 Hearing / Language / Visual special needs []  - 0 Assessment of Community assistance (transportation, D/C planning, etc.) []  - 0 Additional assistance / Altered mentation []  - 0 Support Surface(s) Assessment (bed, cushion, seat,  etc.) INTERVENTIONS - Wound Cleansing / Measurement X - Simple Wound Cleansing - one wound 1 5 []  - 0 Complex Wound Cleansing - multiple wounds X- 1 5 Wound Imaging (photographs - any  number of wounds) []  - 0 Wound Tracing (instead of photographs) X- 1 5 Simple Wound Measurement - one wound []  - 0 Complex Wound Measurement - multiple wounds INTERVENTIONS - Wound Dressings X - Small Wound Dressing one or multiple wounds 1 10 []  - 0 Medium Wound Dressing one or multiple wounds []  - 0 Large Wound Dressing one or multiple wounds []  - 0 Application of Medications - topical []  - 0 Application of Medications - injection INTERVENTIONS - Miscellaneous []  - 0 External ear exam []  - 0 Specimen Collection (cultures, biopsies, blood, body fluids, etc.) []  - 0 Specimen(s) / Culture(s) sent or taken to Lab for analysis []  - 0 Patient Transfer (multiple staff / Civil Service fast streamer / Similar devices) []  - 0 Simple Staple / Suture removal (25 or less) []  - 0 Complex Staple / Suture removal (26 or more) []  - 0 Hypo / Hyperglycemic Management (close monitor of Blood Glucose) []  - 0 Ankle / Brachial Index (ABI) - do not check if billed separately X- 1 5 Vital Signs Has the patient been seen at the hospital within the last three years: Yes Total Score: 75 Level Of Care: New/Established - Level 2 Electronic Signature(s) Signed: 01/13/2022 3:40:40 PM By: Sharyn Creamer RN, BSN Entered By: Sharyn Creamer on 01/13/2022 14:33:51 -------------------------------------------------------------------------------- Encounter Discharge Information Details Patient Name: Date of Service: [name] Barnett, [place] 01/13/2022 2:00 PM Medical Record Number: [id number] Patient Account Number: [id number] Date of Birth/Sex: Treating RN: [birth year] (41 y.o. Donalda Ewings Primary Care Raneshia Derick: Julianne Handler Other Clinician: Referring Anokhi Shannon: Treating Ronie Fleeger/Extender: Eben Burow,  Sharmin Weeks in Treatment: 7 Encounter Discharge Information Items Discharge Condition: Stable Ambulatory Status: Wheelchair Discharge Destination: Home Transportation: Private Auto Accompanied By: self Schedule Follow-up Appointment: Yes Clinical Summary of Care: Patient Declined Electronic Signature(s) Signed: 01/13/2022 3:40:40 PM By: Sharyn Creamer RN, BSN Entered By: Sharyn Creamer on 01/13/2022 14:34:41 -------------------------------------------------------------------------------- Lower Extremity Assessment Details Patient Name: Date of Service: [name] Barnett, [place] 01/13/2022 2:00 PM Medical Record Number: [id number] Patient Account Number: [id number] Date of Birth/Sex: Treating RN: [birth year] (41 y.o. Donalda Ewings Primary Care Anupama Piehl: Julianne Handler Other Clinician: Referring Ciana Simmon: Treating Kahmya Pinkham/Extender: Eben Burow, Sharmin Weeks in Treatment: 7 Electronic Signature(s) Signed: 01/13/2022 3:40:40 PM By: Sharyn Creamer RN, BSN Entered By: Sharyn Creamer on 01/13/2022 14:17:13 -------------------------------------------------------------------------------- Multi Wound Chart Details Patient Name: Date of Service: [name] Barnett, [place] 01/13/2022 2:00 PM Medical Record Number: [id number] Patient Account Number: [id number] Date of Birth/Sex: Treating RN: [birth year] (41 y.o. [name] Primary Care Kalen Neidert: Julianne Handler Other Clinician: Referring Anasophia Pecor: Treating Amahia Madonia/Extender: Eben Burow, Sharmin Weeks in Treatment: 7 Vital Signs Height(in): 65 Pulse(bpm): 73 Weight(lbs): 170 Blood Pressure(mmHg): 149/90 Body Mass Index(BMI): 28.3 Temperature(F): 98.8 Respiratory Rate(breaths/min): 16 Photos: [N/A:N/A] Sacrum N/A N/A Wound Location: Pressure Injury N/A N/A Wounding Event: Pressure Ulcer N/A N/A Primary Etiology: Cataracts, Glaucoma, Anemia, N/A N/A Comorbid  History: Congestive Heart Failure, Hypertension, Type II Diabetes, End Stage Renal Disease, Neuropathy, Confinement Anxiety 07/15/2021 N/A N/A Date Acquired: 64 N/A N/A Weeks of Treatment: Open N/A N/A Wound Status: No N/A N/A Wound Recurrence: 0.2x0.2x0.2 N/A N/A Measurements L x W x D (cm) 0.031 N/A N/A A (cm) : rea 0.006 N/A N/A Volume (cm) : 92.70% N/A N/A % Reduction in A rea: 92.90% N/A N/A % Reduction in Volume: Category/Stage II N/A N/A Classification: Medium N/A N/A Exudate A mount: Serosanguineous N/A N/A Exudate Type: red, brown N/A N/A Exudate Color: Distinct, outline attached N/A N/A  Wound Margin: Large (67-100%) N/A N/A Granulation A mount: Pink N/A N/A Granulation Quality: Small (1-33%) N/A N/A Necrotic A mount: Fat Layer (Subcutaneous Tissue): Yes N/A N/A Exposed Structures: Fascia: No Tendon: No Muscle: No Joint: No Bone: No Large (67-100%) N/A N/A Epithelialization: Treatment Notes Wound #8 (Sacrum) Cleanser Wound Cleanser Discharge Instruction: Cleanse the wound with wound cleanser prior to applying a clean dressing using gauze sponges, not tissue or cotton balls. Peri-Wound Care Skin Prep Discharge Instruction: Use skin prep as directed Topical Primary Dressing KerraCel Ag Gelling Fiber Dressing, 2x2 in (silver alginate) Discharge Instruction: Apply silver alginate to wound bed as instructed Secondary Dressing ALLEVYN Gentle Border, 3x3 (in/in) Discharge Instruction: Apply over primary dressing as directed. Secured With 20M Medipore H Soft Cloth Surgical T ape, 4 x 10 (in/yd) Discharge Instruction: Secure with tape as directed. Compression Wrap Compression Stockings Add-Ons Electronic Signature(s) Signed: 01/13/2022 2:48:04 PM By: Fredirick Maudlin MD FACS Signed: 01/14/2022 5:22:18 PM By: [name] RN, BSN Entered By: Fredirick Maudlin on 01/13/2022  14:48:04 -------------------------------------------------------------------------------- Multi-Disciplinary Care Plan Details Patient Name: Date of Service: [name] DA [place], ELADIA 01/13/2022 2:00 PM Medical Record Number: [id number] Patient Account Number: [id number] Date of Birth/Sex: Treating RN: 06-23-1980 (41 y.o. Donalda Ewings Primary Care Jolinda Pinkstaff: Julianne Handler Other Clinician: Referring Kirk Basquez: Treating Brandonn Capelli/Extender: Eben Burow, Sharmin Weeks in Treatment: 7 Active Inactive Wound/Skin Impairment Nursing Diagnoses: Impaired tissue integrity Knowledge deficit related to ulceration/compromised skin integrity Goals: Patient/caregiver will verbalize understanding of skin care regimen Date Initiated: 11/25/2021 Target Resolution Date: 01/08/2022 Goal Status: Active Ulcer/skin breakdown will have a volume reduction of 30% by week 4 Date Initiated: 11/25/2021 Date Inactivated: 01/13/2022 Target Resolution Date: 01/08/2022 Goal Status: Met Interventions: Assess patient/caregiver ability to obtain necessary supplies Assess ulceration(s) every visit Treatment Activities: Skin care regimen initiated : 11/25/2021 Topical wound management initiated : 11/25/2021 Notes: Electronic Signature(s) Signed: 01/13/2022 3:40:40 PM By: Sharyn Creamer RN, BSN Entered By: Sharyn Creamer on 01/13/2022 14:32:19 -------------------------------------------------------------------------------- Pain Assessment Details Patient Name: Date of Service: [name] Barnett, [place] 01/13/2022 2:00 PM Medical Record Number: [id number] Patient Account Number: [id number] Date of Birth/Sex: Treating RN: 1981-03-06 (41 y.o. Donalda Ewings Primary Care Manoah Deckard: Julianne Handler Other Clinician: Referring Kaia Depaolis: Treating Payson Crumby/Extender: Eben Burow, Sharmin Weeks in Treatment: 7 Active Problems Location of Pain Severity and Description of  Pain Patient Has Paino Yes Site Locations Rate the pain. Current Pain Level: 5 Pain Management and Medication Current Pain Management: Electronic Signature(s) Signed: 01/13/2022 3:40:40 PM By: Sharyn Creamer RN, BSN Entered By: Sharyn Creamer on 01/13/2022 14:17:06 -------------------------------------------------------------------------------- Patient/Caregiver Education Details Patient Name: Date of Service: [name] Barnett, [place] 9/13/2023andnbsp2:00 PM Medical Record Number: [id number] Patient Account Number: [id number] Date of Birth/Gender: Treating RN: Feb 25, 1981 (41 y.o. Donalda Ewings Primary Care Physician: Julianne Handler Other Clinician: Referring Physician: Treating Physician/Extender: Nelma Rothman in Treatment: 7 Education Assessment Education Provided To: Patient Education Topics Provided Pressure: Methods: Explain/Verbal Responses: State content correctly Electronic Signature(s) Signed: 01/13/2022 3:40:40 PM By: Sharyn Creamer RN, BSN Entered By: Sharyn Creamer on 01/13/2022 14:32:41 -------------------------------------------------------------------------------- Wound Assessment Details Patient Name: Date of Service: [name] Barnett, [place] 01/13/2022 2:00 PM Medical Record Number: [id number] Patient Account Number: [id number] Date of Birth/Sex: Treating RN: [birth year] (41 y.o. Donalda Ewings Primary Care Trevel Dillenbeck: Julianne Handler Other Clinician: Referring Barnie Sopko: Treating Latrena Benegas/Extender: Eben Burow, Sharmin Weeks in Treatment: 7 Wound Status Wound Number: 8 Primary Pressure Ulcer Etiology: Wound Location: Sacrum Wound Open Wounding Event: Pressure Injury Status:  Date Acquired: 07/15/2021 Comorbid Cataracts, Glaucoma, Anemia, Congestive Heart Failure, Weeks Of Treatment: 26 History: Hypertension, Type II Diabetes, End Stage Renal Disease, Clustered Wound: No Neuropathy,  Confinement Anxiety Photos Wound Measurements Length: (cm) 0.2 Width: (cm) 0.2 Depth: (cm) 0.2 Area: (cm) 0.031 Volume: (cm) 0.006 % Reduction in Area: 92.7% % Reduction in Volume: 92.9% Epithelialization: Large (67-100%) Tunneling: No Undermining: No Wound Description Classification: Category/Stage II Wound Margin: Distinct, outline attached Exudate Amount: Medium Exudate Type: Serosanguineous Exudate Color: red, brown Foul Odor After Cleansing: No Slough/Fibrino Yes Wound Bed Granulation Amount: Large (67-100%) Exposed Structure Granulation Quality: Pink Fascia Exposed: No Necrotic Amount: Small (1-33%) Fat Layer (Subcutaneous Tissue) Exposed: Yes Necrotic Quality: Adherent Slough Tendon Exposed: No Muscle Exposed: No Joint Exposed: No Bone Exposed: No Treatment Notes Wound #8 (Sacrum) Cleanser Wound Cleanser Discharge Instruction: Cleanse the wound with wound cleanser prior to applying a clean dressing using gauze sponges, not tissue or cotton balls. Peri-Wound Care Skin Prep Discharge Instruction: Use skin prep as directed Topical Primary Dressing KerraCel Ag Gelling Fiber Dressing, 2x2 in (silver alginate) Discharge Instruction: Apply silver alginate to wound bed as instructed Secondary Dressing ALLEVYN Gentle Border, 3x3 (in/in) Discharge Instruction: Apply over primary dressing as directed. Secured With 65M Medipore H Soft Cloth Surgical T ape, 4 x 10 (in/yd) Discharge Instruction: Secure with tape as directed. Compression Wrap Compression Stockings Add-Ons Electronic Signature(s) Signed: 01/13/2022 3:40:40 PM By: Sharyn Creamer RN, BSN Entered By: Sharyn Creamer on 01/13/2022 14:20:50 -------------------------------------------------------------------------------- Vitals Details Patient Name: Date of Service: [name] Barnett, [place] 01/13/2022 2:00 PM Medical Record Number: [id number] Patient Account Number: [id number] Date of Birth/Sex: Treating  RN: March 31, 1981 (41 y.o. Donalda Ewings Primary Care Brandi Tomlinson: Julianne Handler Other Clinician: Referring Sonam Huelsmann: Treating Nasif Bos/Extender: Eben Burow, Sharmin Weeks in Treatment: 7 Vital Signs Time Taken: 14:16 Temperature (F): 98.8 Height (in): 65 Pulse (bpm): 73 Weight (lbs): 170 Respiratory Rate (breaths/min): 16 Body Mass Index (BMI): 28.3 Blood Pressure (mmHg): 149/90 Reference Range: 80 - 120 mg / dl Electronic Signature(s) Signed: 01/13/2022 3:40:40 PM By: Sharyn Creamer RN, BSN Entered By: Sharyn Creamer on 01/13/2022 14:16:53

## 2022-01-16 DIAGNOSIS — N2581 Secondary hyperparathyroidism of renal origin: Secondary | ICD-10-CM | POA: Diagnosis not present

## 2022-01-16 DIAGNOSIS — Z992 Dependence on renal dialysis: Secondary | ICD-10-CM | POA: Diagnosis not present

## 2022-01-16 DIAGNOSIS — N186 End stage renal disease: Secondary | ICD-10-CM | POA: Diagnosis not present

## 2022-01-19 DIAGNOSIS — N2581 Secondary hyperparathyroidism of renal origin: Secondary | ICD-10-CM | POA: Diagnosis not present

## 2022-01-19 DIAGNOSIS — N186 End stage renal disease: Secondary | ICD-10-CM | POA: Diagnosis not present

## 2022-01-19 DIAGNOSIS — Z992 Dependence on renal dialysis: Secondary | ICD-10-CM | POA: Diagnosis not present

## 2022-01-21 DIAGNOSIS — Z992 Dependence on renal dialysis: Secondary | ICD-10-CM | POA: Diagnosis not present

## 2022-01-21 DIAGNOSIS — N186 End stage renal disease: Secondary | ICD-10-CM | POA: Diagnosis not present

## 2022-01-21 DIAGNOSIS — N2581 Secondary hyperparathyroidism of renal origin: Secondary | ICD-10-CM | POA: Diagnosis not present

## 2022-01-23 DIAGNOSIS — N2581 Secondary hyperparathyroidism of renal origin: Secondary | ICD-10-CM | POA: Diagnosis not present

## 2022-01-23 DIAGNOSIS — N186 End stage renal disease: Secondary | ICD-10-CM | POA: Diagnosis not present

## 2022-01-23 DIAGNOSIS — Z992 Dependence on renal dialysis: Secondary | ICD-10-CM | POA: Diagnosis not present

## 2022-01-26 DIAGNOSIS — N186 End stage renal disease: Secondary | ICD-10-CM | POA: Diagnosis not present

## 2022-01-26 DIAGNOSIS — N2581 Secondary hyperparathyroidism of renal origin: Secondary | ICD-10-CM | POA: Diagnosis not present

## 2022-01-26 DIAGNOSIS — Z992 Dependence on renal dialysis: Secondary | ICD-10-CM | POA: Diagnosis not present

## 2022-01-27 DIAGNOSIS — Z933 Colostomy status: Secondary | ICD-10-CM | POA: Diagnosis not present

## 2022-01-28 DIAGNOSIS — N2581 Secondary hyperparathyroidism of renal origin: Secondary | ICD-10-CM | POA: Diagnosis not present

## 2022-01-28 DIAGNOSIS — Z992 Dependence on renal dialysis: Secondary | ICD-10-CM | POA: Diagnosis not present

## 2022-01-28 DIAGNOSIS — N186 End stage renal disease: Secondary | ICD-10-CM | POA: Diagnosis not present

## 2022-01-29 DIAGNOSIS — T82858A Stenosis of vascular prosthetic devices, implants and grafts, initial encounter: Secondary | ICD-10-CM | POA: Diagnosis not present

## 2022-01-29 DIAGNOSIS — N186 End stage renal disease: Secondary | ICD-10-CM | POA: Diagnosis not present

## 2022-01-29 DIAGNOSIS — I871 Compression of vein: Secondary | ICD-10-CM | POA: Diagnosis not present

## 2022-01-29 DIAGNOSIS — Z992 Dependence on renal dialysis: Secondary | ICD-10-CM | POA: Diagnosis not present

## 2022-01-30 DIAGNOSIS — N186 End stage renal disease: Secondary | ICD-10-CM | POA: Diagnosis not present

## 2022-01-30 DIAGNOSIS — N2581 Secondary hyperparathyroidism of renal origin: Secondary | ICD-10-CM | POA: Diagnosis not present

## 2022-01-30 DIAGNOSIS — Z992 Dependence on renal dialysis: Secondary | ICD-10-CM | POA: Diagnosis not present

## 2022-01-30 DIAGNOSIS — E1129 Type 2 diabetes mellitus with other diabetic kidney complication: Secondary | ICD-10-CM | POA: Diagnosis not present

## 2022-02-01 ENCOUNTER — Ambulatory Visit (HOSPITAL_COMMUNITY): Payer: No Typology Code available for payment source | Admitting: Physical Therapy

## 2022-02-02 DIAGNOSIS — Z992 Dependence on renal dialysis: Secondary | ICD-10-CM | POA: Diagnosis not present

## 2022-02-02 DIAGNOSIS — N186 End stage renal disease: Secondary | ICD-10-CM | POA: Diagnosis not present

## 2022-02-02 DIAGNOSIS — N2581 Secondary hyperparathyroidism of renal origin: Secondary | ICD-10-CM | POA: Diagnosis not present

## 2022-02-03 ENCOUNTER — Encounter (HOSPITAL_COMMUNITY): Payer: Self-pay | Admitting: Physical Therapy

## 2022-02-03 ENCOUNTER — Ambulatory Visit (HOSPITAL_COMMUNITY): Payer: No Typology Code available for payment source | Attending: Specialist | Admitting: Physical Therapy

## 2022-02-03 DIAGNOSIS — M6281 Muscle weakness (generalized): Secondary | ICD-10-CM | POA: Insufficient documentation

## 2022-02-03 DIAGNOSIS — R262 Difficulty in walking, not elsewhere classified: Secondary | ICD-10-CM | POA: Insufficient documentation

## 2022-02-03 DIAGNOSIS — M62452 Contracture of muscle, left thigh: Secondary | ICD-10-CM | POA: Insufficient documentation

## 2022-02-03 DIAGNOSIS — R2689 Other abnormalities of gait and mobility: Secondary | ICD-10-CM | POA: Diagnosis present

## 2022-02-03 DIAGNOSIS — M6702 Short Achilles tendon (acquired), left ankle: Secondary | ICD-10-CM | POA: Insufficient documentation

## 2022-02-03 NOTE — Therapy (Signed)
OUTPATIENT PHYSICAL THERAPY LOWER EXTREMITY EVALUATION   Patient Name: [name] Barnett MRN: [id number] DOB:06/20/1980, 41 y.o., female Today's Date: 02/03/2022   PT End of Session - 02/03/22 0948     Visit Number 2    Number of Visits 16    Date for PT Re-Evaluation 03/08/22    Authorization Type [employer] HMO (Medicaid [place] secondary)    Authorization Time Period approv 16 visist (9/11-11/06)    Authorization - Visit Number 2    Authorization - Number of Visits 16    Progress Note Due on Visit 10    PT Start Time [phone]    PT Stop Time 1030    PT Time Calculation (min) 42 min    Equipment Utilized During Treatment Gait belt    Activity Tolerance Patient tolerated treatment well    Behavior During Therapy WFL for tasks assessed/performed             Past Medical History:  Diagnosis Date   Diabetes mellitus without complication ([place])    Hyperlipidemia    Hypertension    Renal disorder    Stroke ([place])    Vitamin D deficiency    Past Surgical History:  Procedure Laterality Date   cardiac stents     CATARACT EXTRACTION W/PHACO Left 08/29/2020   Procedure: CATARACT EXTRACTION PHACO AND INTRAOCULAR LENS PLACEMENT LEFT EYE;  Surgeon: Baruch Goldmann, MD;  Location: [facility];  Service: Ophthalmology;  Laterality: Left;  left CDE=29.30   CATARACT EXTRACTION W/PHACO Right 11/28/2020   Procedure: CATARACT EXTRACTION PHACO AND INTRAOCULAR LENS PLACEMENT (IOC);  Surgeon: Baruch Goldmann, MD;  Location: [facility];  Service: Ophthalmology;  Laterality: Right;  CDE 4.35   COLON SURGERY     COLOSTOMY     TUBAL LIGATION     wound on buttocks     Patient Active Problem List   Diagnosis Date Noted   Pressure injury of skin 10/01/2021   Chronic kidney disease (CKD) stage G5/A1, glomerular filtration rate (GFR) less than or equal to 15 mL/min/1.73 square meter and albuminuria creatinine ratio less than 30 mg/g ([facility]) 06/22/2021   Dialysis patient ([place]) 06/22/2021   BV  (bacterial vaginosis) 04/02/2020   Vaginal itching 04/02/2020   Vaginal odor 04/02/2020   Vaginal discharge 04/02/2020   Sepsis secondary to UTI ([place]) 01/13/2020   Acute respiratory failure with hypoxia ([address]) 01/13/2020   Right leg swelling 01/13/2020   Sacral decubitus ulcer 01/13/2020   Anemia due to end stage renal disease ([place]) 01/13/2020   Essential hypertension 01/13/2020   Hyperlipidemia 01/13/2020   Type 2 diabetes mellitus ([place]) 01/13/2020    PCP: Julianne Handler, DO  REFERRING PROVIDER: Basil Dess, MD  REFERRING DIAG: [phone] (ICD-10-CM) - Contracture of left hamstring M62.469,G83.9 (ICD-10-CM) - 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 Reports she will be moving near [place] sometime soon.  PERTINENT HISTORY: History of CVA, Lumbar discitis and end stage renal failure, dialysis dependent. Left side colostomy bag. PAIN:  Are you  having pain? No  PRECAUTIONS: None  WEIGHT BEARING RESTRICTIONS No  FALLS:  Has patient fallen in last 6 months? No  LIVING ENVIRONMENT: Lives with: lives in an assisted living facility Lives in: Other ALF Stairs: No Has following equipment at home: Wheelchair (manual) and upright walker  OCCUPATION: n/a  PLOF:  Prior to CVA used a 4WW to ambulate.  PATIENT GOALS Improve balance and walking ability   OBJECTIVE:   PATIENT  SURVEYS:  FOTO 43%  COGNITION:  Overall cognitive status: Within functional limits for tasks assessed     SENSATION: WFL  EDEMA:  Hx of lymphedema RLE  MUSCLE LENGTH: Hamstrings:  Left 60 deg from full knee extension (90/90 measurement) Thomas test:  Left 16 deg from neutral  POSTURE: rounded shoulders, forward head, and decreased lumbar lordosis    LOWER EXTREMITY ROM:  Active ROM Right eval Left eval  Hip flexion    Hip extension    Hip abduction    Hip adduction    Hip internal rotation    Hip external rotation    Knee flexion 133 123  Knee extension -12 -40  Ankle dorsiflexion 0 (-12 knee ext) (-10 knee flexed)  Ankle plantarflexion    Ankle inversion    Ankle eversion     (Blank rows = not tested)  LOWER EXTREMITY MMT:  MMT (Taken seated) Right eval Left eval  Hip flexion 4- 3  Hip extension 4 3-  Hip abduction 4 4-  Hip adduction    Hip internal rotation    Hip external rotation    Knee flexion 4 3+  Knee extension 4+ 4- (range limited)  Ankle dorsiflexion 4- 3-  Ankle plantarflexion    Ankle inversion    Ankle eversion     (Blank rows = not tested)    FUNCTIONAL TESTS:  Timed up and go (TUG): 91 sec with RW at CGA 2 minute walk test: 42 feet with RW and CGA; needed to sit at 1:35 sec  GAIT: Distance walked: 42  Assistive device utilized: Environmental consultant - 2 wheeled Level of assistance: CGA Comments: Left hip and knee flexed throughout gait cycle, trunk flexed, Lt forefoot strikes at initial contact, lacks foot flat on Lt, lacks extension to neutral on Lt    TODAY'S TREATMENT: 02/03/22 NuStep lvl 1 x 6' dynamic warm-up and ROM Cybex HS curl 4 pl 3x10 focus on eccentric end range load for ROM Cybex Passive HS stretch underload x3' Seated LAQ 4# 3x10 Seated GTB hip abduction 3x10 Seated isometric hip adduction squeeze 3x10 Standing in // bars, Rt hip march, focusing on Left knee and hip extension x10  Eval MMT, 2WT, muscle length,  TUG Education HEP   PATIENT EDUCATION:  Education details: Findings, POC, low load long duration stretch, PT role Person educated: Patient Education method: Explanation Education comprehension: verbalized understanding   HOME EXERCISE PROGRAM: Access Code: [email or web address] URL: [email or web address] Date: 01/11/2022 Prepared by: Candie Mile  Exercises - Seated Knee Extension Stretch with Chair (Mirrored)  - 3 x daily - 7 x weekly - 3 sets - 20-30 seconds hold - Seated Hamstring Stretch (Mirrored)  - 3 x daily - 7 x weekly - 3 sets - 20-30 hold - Seated Gastroc Stretch with Strap (Mirrored)  - 3 x daily - 7 x weekly - 3 sets - 20-30 hold - Seated Knee Flexion Stretch (Mirrored)  - 3 x daily - 7 x weekly - 3 sets - 10 reps - 20-30 hold  ASSESSMENT:  CLINICAL  IMPRESSION: Tolerated exercises well, tailored positioning to optimize range and tolerance. Adequately fatigued with strengthening exercises. SBA with transfers for safety.   OBJECTIVE IMPAIRMENTS Abnormal gait, decreased activity tolerance, decreased balance, decreased endurance, decreased knowledge of use of DME, decreased mobility, difficulty walking, decreased ROM, decreased strength, hypomobility, increased edema, increased fascial restrictions, impaired flexibility, impaired tone, and pain.   ACTIVITY LIMITATIONS carrying, lifting, bending, standing, squatting, stairs, transfers, and locomotion level  PARTICIPATION LIMITATIONS: meal prep, cleaning, laundry, driving, shopping, and community activity  [place], Time since onset of injury/illness/exacerbation, Transportation, and 3+ comorbidities: dialysis, CVA, colostomy, contractures  are also affecting patient's functional outcome.   REHAB POTENTIAL: Fair contractures are chronic  CLINICAL DECISION MAKING: Evolving/moderate complexity  EVALUATION COMPLEXITY: Moderate   GOALS: Goals reviewed with patient? Yes  SHORT TERM GOALS: Target  date: 02/08/22  Patient will be independent with initial HEP and self-management strategies to improve functional outcomes Baseline: initiated Goal status: INITIAL     2. Patient will demonstrate 10  degree improvement in left hamstring length with 90/90 hamstring test to improve gait symmetry Baseline: lacks 60 degrees from full extension Goal status: INITIAL    LONG TERM GOALS: Target date: 03/08/22  Patient will be independent with advanced HEP and self-management strategies to improve functional outcomes Baseline: n/a Goal status: INITIAL  2.  Patient will improve FOTO score to predicted value of 57% to indicate improvement in functional outcomes Baseline: 43% Goal status: INITIAL  3.  Patient will have Lt knee AROM 0-120 degrees to improve functional mobility and facilitate squatting to pick up items from floor. Baseline: see above Goal status: INITIAL  4. Patient will have equal to or > 4+/5 MMT throughout BIL LEs to improve ability to perform functional mobility, stair ambulation and ADLs.  Baseline: see above Goal status: INITIAL  5. Patient will be able to ambulate at least 100 feet during 2MWT with LRAD to demonstrate improved ability to perform functional mobility and associated tasks. Baseline: 42 feet with RW Goal status: INITIAL   6. Patient will improve TUG with LRAD <30 seconds to show significant improvement in functional strength with gait and transfers. Baseline: 91 seconds with RW Goal status: INITIAL  PLAN: PT FREQUENCY: 2x/week  PT DURATION: 8 weeks  PLANNED INTERVENTIONS: Therapeutic exercises, Therapeutic activity, Neuromuscular re-education, Balance training, Gait training, Patient/Family education, Self Care, Joint mobilization, Joint manipulation, Stair training, Orthotic/Fit training, DME instructions, Dry Needling, Electrical stimulation, Cryotherapy, Moist heat, Splintting, Taping, Ultrasound, Ionotophoresis 4mg /ml Dexamethasone, Manual therapy,  and Re-evaluation  PLAN FOR NEXT SESSION: LLE ROM, progressive strengthening, gait training    02/03/2022, 10:37 AM   Candie Mile, PT, DPT Physical Therapist [facility] [place]

## 2022-02-06 DIAGNOSIS — N186 End stage renal disease: Secondary | ICD-10-CM | POA: Diagnosis not present

## 2022-02-06 DIAGNOSIS — N2581 Secondary hyperparathyroidism of renal origin: Secondary | ICD-10-CM | POA: Diagnosis not present

## 2022-02-06 DIAGNOSIS — Z992 Dependence on renal dialysis: Secondary | ICD-10-CM | POA: Diagnosis not present

## 2022-02-08 ENCOUNTER — Encounter (HOSPITAL_COMMUNITY): Payer: Medicare HMO | Admitting: Physical Therapy

## 2022-02-09 DIAGNOSIS — N186 End stage renal disease: Secondary | ICD-10-CM | POA: Diagnosis not present

## 2022-02-09 DIAGNOSIS — Z992 Dependence on renal dialysis: Secondary | ICD-10-CM | POA: Diagnosis not present

## 2022-02-09 DIAGNOSIS — N2581 Secondary hyperparathyroidism of renal origin: Secondary | ICD-10-CM | POA: Diagnosis not present

## 2022-02-10 ENCOUNTER — Ambulatory Visit: Payer: Medicare HMO | Admitting: Specialist

## 2022-02-11 DIAGNOSIS — Z992 Dependence on renal dialysis: Secondary | ICD-10-CM | POA: Diagnosis not present

## 2022-02-11 DIAGNOSIS — N186 End stage renal disease: Secondary | ICD-10-CM | POA: Diagnosis not present

## 2022-02-11 DIAGNOSIS — N2581 Secondary hyperparathyroidism of renal origin: Secondary | ICD-10-CM | POA: Diagnosis not present

## 2022-02-12 ENCOUNTER — Encounter (HOSPITAL_BASED_OUTPATIENT_CLINIC_OR_DEPARTMENT_OTHER): Payer: No Typology Code available for payment source | Attending: General Surgery | Admitting: Internal Medicine

## 2022-02-12 ENCOUNTER — Encounter (HOSPITAL_COMMUNITY): Payer: Medicare HMO

## 2022-02-13 DIAGNOSIS — Z992 Dependence on renal dialysis: Secondary | ICD-10-CM | POA: Diagnosis not present

## 2022-02-13 DIAGNOSIS — N2581 Secondary hyperparathyroidism of renal origin: Secondary | ICD-10-CM | POA: Diagnosis not present

## 2022-02-13 DIAGNOSIS — N186 End stage renal disease: Secondary | ICD-10-CM | POA: Diagnosis not present

## 2022-02-15 ENCOUNTER — Ambulatory Visit: Payer: Medicare HMO | Admitting: Specialist

## 2022-02-16 DIAGNOSIS — Z992 Dependence on renal dialysis: Secondary | ICD-10-CM | POA: Diagnosis not present

## 2022-02-16 DIAGNOSIS — N2581 Secondary hyperparathyroidism of renal origin: Secondary | ICD-10-CM | POA: Diagnosis not present

## 2022-02-16 DIAGNOSIS — N186 End stage renal disease: Secondary | ICD-10-CM | POA: Diagnosis not present

## 2022-02-17 ENCOUNTER — Encounter (HOSPITAL_COMMUNITY): Payer: Medicare HMO

## 2022-02-18 DIAGNOSIS — Z992 Dependence on renal dialysis: Secondary | ICD-10-CM | POA: Diagnosis not present

## 2022-02-18 DIAGNOSIS — N2581 Secondary hyperparathyroidism of renal origin: Secondary | ICD-10-CM | POA: Diagnosis not present

## 2022-02-18 DIAGNOSIS — N186 End stage renal disease: Secondary | ICD-10-CM | POA: Diagnosis not present

## 2022-02-19 ENCOUNTER — Encounter (HOSPITAL_COMMUNITY): Payer: Medicare HMO

## 2022-02-20 DIAGNOSIS — Z992 Dependence on renal dialysis: Secondary | ICD-10-CM | POA: Diagnosis not present

## 2022-02-20 DIAGNOSIS — N2581 Secondary hyperparathyroidism of renal origin: Secondary | ICD-10-CM | POA: Diagnosis not present

## 2022-02-20 DIAGNOSIS — N186 End stage renal disease: Secondary | ICD-10-CM | POA: Diagnosis not present

## 2022-02-22 ENCOUNTER — Encounter (HOSPITAL_COMMUNITY): Payer: Medicare HMO | Admitting: Physical Therapy

## 2022-02-23 DIAGNOSIS — N186 End stage renal disease: Secondary | ICD-10-CM | POA: Diagnosis not present

## 2022-02-23 DIAGNOSIS — Z992 Dependence on renal dialysis: Secondary | ICD-10-CM | POA: Diagnosis not present

## 2022-02-23 DIAGNOSIS — N2581 Secondary hyperparathyroidism of renal origin: Secondary | ICD-10-CM | POA: Diagnosis not present

## 2022-02-25 DIAGNOSIS — Z992 Dependence on renal dialysis: Secondary | ICD-10-CM | POA: Diagnosis not present

## 2022-02-25 DIAGNOSIS — N186 End stage renal disease: Secondary | ICD-10-CM | POA: Diagnosis not present

## 2022-02-25 DIAGNOSIS — N2581 Secondary hyperparathyroidism of renal origin: Secondary | ICD-10-CM | POA: Diagnosis not present

## 2022-02-26 ENCOUNTER — Encounter (HOSPITAL_COMMUNITY): Payer: Medicare HMO | Admitting: Physical Therapy

## 2022-02-27 DIAGNOSIS — N186 End stage renal disease: Secondary | ICD-10-CM | POA: Diagnosis not present

## 2022-02-27 DIAGNOSIS — N2581 Secondary hyperparathyroidism of renal origin: Secondary | ICD-10-CM | POA: Diagnosis not present

## 2022-02-27 DIAGNOSIS — Z992 Dependence on renal dialysis: Secondary | ICD-10-CM | POA: Diagnosis not present

## 2022-03-02 DIAGNOSIS — N2581 Secondary hyperparathyroidism of renal origin: Secondary | ICD-10-CM | POA: Diagnosis not present

## 2022-03-02 DIAGNOSIS — N186 End stage renal disease: Secondary | ICD-10-CM | POA: Diagnosis not present

## 2022-03-02 DIAGNOSIS — Z992 Dependence on renal dialysis: Secondary | ICD-10-CM | POA: Diagnosis not present

## 2022-03-03 ENCOUNTER — Encounter (HOSPITAL_COMMUNITY): Payer: Medicare HMO

## 2022-03-04 DIAGNOSIS — N186 End stage renal disease: Secondary | ICD-10-CM | POA: Diagnosis not present

## 2022-03-04 DIAGNOSIS — Z992 Dependence on renal dialysis: Secondary | ICD-10-CM | POA: Diagnosis not present

## 2022-03-04 DIAGNOSIS — N2581 Secondary hyperparathyroidism of renal origin: Secondary | ICD-10-CM | POA: Diagnosis not present

## 2022-03-05 ENCOUNTER — Encounter (HOSPITAL_COMMUNITY): Payer: Medicare HMO | Admitting: Physical Therapy

## 2022-03-06 DIAGNOSIS — N2581 Secondary hyperparathyroidism of renal origin: Secondary | ICD-10-CM | POA: Diagnosis not present

## 2022-03-06 DIAGNOSIS — N186 End stage renal disease: Secondary | ICD-10-CM | POA: Diagnosis not present

## 2022-03-06 DIAGNOSIS — Z992 Dependence on renal dialysis: Secondary | ICD-10-CM | POA: Diagnosis not present

## 2022-03-08 ENCOUNTER — Encounter (HOSPITAL_COMMUNITY): Payer: Medicare HMO | Admitting: Physical Therapy

## 2022-03-09 DIAGNOSIS — N186 End stage renal disease: Secondary | ICD-10-CM | POA: Diagnosis not present

## 2022-03-09 DIAGNOSIS — N2581 Secondary hyperparathyroidism of renal origin: Secondary | ICD-10-CM | POA: Diagnosis not present

## 2022-03-09 DIAGNOSIS — Z992 Dependence on renal dialysis: Secondary | ICD-10-CM | POA: Diagnosis not present

## 2022-03-11 DIAGNOSIS — N2581 Secondary hyperparathyroidism of renal origin: Secondary | ICD-10-CM | POA: Diagnosis not present

## 2022-03-11 DIAGNOSIS — Z992 Dependence on renal dialysis: Secondary | ICD-10-CM | POA: Diagnosis not present

## 2022-03-11 DIAGNOSIS — N186 End stage renal disease: Secondary | ICD-10-CM | POA: Diagnosis not present

## 2022-03-12 ENCOUNTER — Encounter (HOSPITAL_COMMUNITY): Payer: Medicare HMO | Admitting: Physical Therapy

## 2022-03-13 DIAGNOSIS — Z992 Dependence on renal dialysis: Secondary | ICD-10-CM | POA: Diagnosis not present

## 2022-03-13 DIAGNOSIS — N186 End stage renal disease: Secondary | ICD-10-CM | POA: Diagnosis not present

## 2022-03-13 DIAGNOSIS — N2581 Secondary hyperparathyroidism of renal origin: Secondary | ICD-10-CM | POA: Diagnosis not present

## 2022-03-15 DIAGNOSIS — Z9851 Tubal ligation status: Secondary | ICD-10-CM | POA: Diagnosis not present

## 2022-03-15 DIAGNOSIS — Z955 Presence of coronary angioplasty implant and graft: Secondary | ICD-10-CM | POA: Diagnosis not present

## 2022-03-15 DIAGNOSIS — N186 End stage renal disease: Secondary | ICD-10-CM | POA: Diagnosis not present

## 2022-03-15 DIAGNOSIS — E1122 Type 2 diabetes mellitus with diabetic chronic kidney disease: Secondary | ICD-10-CM | POA: Diagnosis not present

## 2022-03-15 DIAGNOSIS — K9423 Gastrostomy malfunction: Secondary | ICD-10-CM | POA: Diagnosis not present

## 2022-03-15 DIAGNOSIS — Z992 Dependence on renal dialysis: Secondary | ICD-10-CM | POA: Diagnosis not present

## 2022-03-15 DIAGNOSIS — E785 Hyperlipidemia, unspecified: Secondary | ICD-10-CM | POA: Diagnosis not present

## 2022-03-15 DIAGNOSIS — K9400 Colostomy complication, unspecified: Secondary | ICD-10-CM | POA: Diagnosis not present

## 2022-03-15 DIAGNOSIS — I132 Hypertensive heart and chronic kidney disease with heart failure and with stage 5 chronic kidney disease, or end stage renal disease: Secondary | ICD-10-CM | POA: Diagnosis not present

## 2022-03-15 DIAGNOSIS — I509 Heart failure, unspecified: Secondary | ICD-10-CM | POA: Diagnosis not present

## 2022-03-15 DIAGNOSIS — Z433 Encounter for attention to colostomy: Secondary | ICD-10-CM | POA: Diagnosis not present

## 2022-03-15 DIAGNOSIS — Z743 Need for continuous supervision: Secondary | ICD-10-CM | POA: Diagnosis not present

## 2022-03-16 DIAGNOSIS — N2581 Secondary hyperparathyroidism of renal origin: Secondary | ICD-10-CM | POA: Diagnosis not present

## 2022-03-16 DIAGNOSIS — Z992 Dependence on renal dialysis: Secondary | ICD-10-CM | POA: Diagnosis not present

## 2022-03-16 DIAGNOSIS — N186 End stage renal disease: Secondary | ICD-10-CM | POA: Diagnosis not present

## 2022-03-18 DIAGNOSIS — Z992 Dependence on renal dialysis: Secondary | ICD-10-CM | POA: Diagnosis not present

## 2022-03-18 DIAGNOSIS — N186 End stage renal disease: Secondary | ICD-10-CM | POA: Diagnosis not present

## 2022-03-18 DIAGNOSIS — N2581 Secondary hyperparathyroidism of renal origin: Secondary | ICD-10-CM | POA: Diagnosis not present

## 2022-03-20 DIAGNOSIS — Z992 Dependence on renal dialysis: Secondary | ICD-10-CM | POA: Diagnosis not present

## 2022-03-20 DIAGNOSIS — N2581 Secondary hyperparathyroidism of renal origin: Secondary | ICD-10-CM | POA: Diagnosis not present

## 2022-03-20 DIAGNOSIS — N186 End stage renal disease: Secondary | ICD-10-CM | POA: Diagnosis not present

## 2022-03-22 DIAGNOSIS — N186 End stage renal disease: Secondary | ICD-10-CM | POA: Diagnosis not present

## 2022-03-22 DIAGNOSIS — N2581 Secondary hyperparathyroidism of renal origin: Secondary | ICD-10-CM | POA: Diagnosis not present

## 2022-03-22 DIAGNOSIS — Z992 Dependence on renal dialysis: Secondary | ICD-10-CM | POA: Diagnosis not present

## 2022-03-24 DIAGNOSIS — N2581 Secondary hyperparathyroidism of renal origin: Secondary | ICD-10-CM | POA: Diagnosis not present

## 2022-03-24 DIAGNOSIS — N186 End stage renal disease: Secondary | ICD-10-CM | POA: Diagnosis not present

## 2022-03-24 DIAGNOSIS — Z992 Dependence on renal dialysis: Secondary | ICD-10-CM | POA: Diagnosis not present

## 2022-03-27 DIAGNOSIS — N2581 Secondary hyperparathyroidism of renal origin: Secondary | ICD-10-CM | POA: Diagnosis not present

## 2022-03-27 DIAGNOSIS — Z992 Dependence on renal dialysis: Secondary | ICD-10-CM | POA: Diagnosis not present

## 2022-03-27 DIAGNOSIS — N186 End stage renal disease: Secondary | ICD-10-CM | POA: Diagnosis not present

## 2022-03-30 DIAGNOSIS — Z992 Dependence on renal dialysis: Secondary | ICD-10-CM | POA: Diagnosis not present

## 2022-03-30 DIAGNOSIS — N186 End stage renal disease: Secondary | ICD-10-CM | POA: Diagnosis not present

## 2022-03-30 DIAGNOSIS — N2581 Secondary hyperparathyroidism of renal origin: Secondary | ICD-10-CM | POA: Diagnosis not present

## 2022-04-01 DIAGNOSIS — N186 End stage renal disease: Secondary | ICD-10-CM | POA: Diagnosis not present

## 2022-04-01 DIAGNOSIS — Z992 Dependence on renal dialysis: Secondary | ICD-10-CM | POA: Diagnosis not present

## 2022-04-01 DIAGNOSIS — N2581 Secondary hyperparathyroidism of renal origin: Secondary | ICD-10-CM | POA: Diagnosis not present

## 2022-04-03 DIAGNOSIS — Z992 Dependence on renal dialysis: Secondary | ICD-10-CM | POA: Diagnosis not present

## 2022-04-03 DIAGNOSIS — N2581 Secondary hyperparathyroidism of renal origin: Secondary | ICD-10-CM | POA: Diagnosis not present

## 2022-04-03 DIAGNOSIS — N186 End stage renal disease: Secondary | ICD-10-CM | POA: Diagnosis not present

## 2022-04-06 DIAGNOSIS — Z992 Dependence on renal dialysis: Secondary | ICD-10-CM | POA: Diagnosis not present

## 2022-04-06 DIAGNOSIS — N186 End stage renal disease: Secondary | ICD-10-CM | POA: Diagnosis not present

## 2022-04-06 DIAGNOSIS — N2581 Secondary hyperparathyroidism of renal origin: Secondary | ICD-10-CM | POA: Diagnosis not present

## 2022-04-08 DIAGNOSIS — Z992 Dependence on renal dialysis: Secondary | ICD-10-CM | POA: Diagnosis not present

## 2022-04-08 DIAGNOSIS — N2581 Secondary hyperparathyroidism of renal origin: Secondary | ICD-10-CM | POA: Diagnosis not present

## 2022-04-08 DIAGNOSIS — N186 End stage renal disease: Secondary | ICD-10-CM | POA: Diagnosis not present

## 2022-04-10 DIAGNOSIS — N186 End stage renal disease: Secondary | ICD-10-CM | POA: Diagnosis not present

## 2022-04-10 DIAGNOSIS — N2581 Secondary hyperparathyroidism of renal origin: Secondary | ICD-10-CM | POA: Diagnosis not present

## 2022-04-10 DIAGNOSIS — Z992 Dependence on renal dialysis: Secondary | ICD-10-CM | POA: Diagnosis not present

## 2022-04-13 DIAGNOSIS — N186 End stage renal disease: Secondary | ICD-10-CM | POA: Diagnosis not present

## 2022-04-13 DIAGNOSIS — Z992 Dependence on renal dialysis: Secondary | ICD-10-CM | POA: Diagnosis not present

## 2022-04-13 DIAGNOSIS — N2581 Secondary hyperparathyroidism of renal origin: Secondary | ICD-10-CM | POA: Diagnosis not present

## 2022-04-15 DIAGNOSIS — N186 End stage renal disease: Secondary | ICD-10-CM | POA: Diagnosis not present

## 2022-04-15 DIAGNOSIS — N2581 Secondary hyperparathyroidism of renal origin: Secondary | ICD-10-CM | POA: Diagnosis not present

## 2022-04-15 DIAGNOSIS — Z992 Dependence on renal dialysis: Secondary | ICD-10-CM | POA: Diagnosis not present

## 2022-04-17 DIAGNOSIS — N186 End stage renal disease: Secondary | ICD-10-CM | POA: Diagnosis not present

## 2022-04-17 DIAGNOSIS — Z992 Dependence on renal dialysis: Secondary | ICD-10-CM | POA: Diagnosis not present

## 2022-04-17 DIAGNOSIS — N2581 Secondary hyperparathyroidism of renal origin: Secondary | ICD-10-CM | POA: Diagnosis not present

## 2022-04-20 DIAGNOSIS — N2581 Secondary hyperparathyroidism of renal origin: Secondary | ICD-10-CM | POA: Diagnosis not present

## 2022-04-20 DIAGNOSIS — N186 End stage renal disease: Secondary | ICD-10-CM | POA: Diagnosis not present

## 2022-04-20 DIAGNOSIS — Z992 Dependence on renal dialysis: Secondary | ICD-10-CM | POA: Diagnosis not present

## 2022-04-22 DIAGNOSIS — Z992 Dependence on renal dialysis: Secondary | ICD-10-CM | POA: Diagnosis not present

## 2022-04-22 DIAGNOSIS — N2581 Secondary hyperparathyroidism of renal origin: Secondary | ICD-10-CM | POA: Diagnosis not present

## 2022-04-22 DIAGNOSIS — N186 End stage renal disease: Secondary | ICD-10-CM | POA: Diagnosis not present

## 2022-04-24 DIAGNOSIS — Z992 Dependence on renal dialysis: Secondary | ICD-10-CM | POA: Diagnosis not present

## 2022-04-24 DIAGNOSIS — N186 End stage renal disease: Secondary | ICD-10-CM | POA: Diagnosis not present

## 2022-04-24 DIAGNOSIS — N2581 Secondary hyperparathyroidism of renal origin: Secondary | ICD-10-CM | POA: Diagnosis not present

## 2022-04-27 DIAGNOSIS — N186 End stage renal disease: Secondary | ICD-10-CM | POA: Diagnosis not present

## 2022-04-27 DIAGNOSIS — Z992 Dependence on renal dialysis: Secondary | ICD-10-CM | POA: Diagnosis not present

## 2022-04-27 DIAGNOSIS — N2581 Secondary hyperparathyroidism of renal origin: Secondary | ICD-10-CM | POA: Diagnosis not present

## 2022-04-29 DIAGNOSIS — Z992 Dependence on renal dialysis: Secondary | ICD-10-CM | POA: Diagnosis not present

## 2022-04-29 DIAGNOSIS — N186 End stage renal disease: Secondary | ICD-10-CM | POA: Diagnosis not present

## 2022-04-29 DIAGNOSIS — N2581 Secondary hyperparathyroidism of renal origin: Secondary | ICD-10-CM | POA: Diagnosis not present

## 2022-05-01 DIAGNOSIS — Z992 Dependence on renal dialysis: Secondary | ICD-10-CM | POA: Diagnosis not present

## 2022-05-01 DIAGNOSIS — N186 End stage renal disease: Secondary | ICD-10-CM | POA: Diagnosis not present

## 2022-05-01 DIAGNOSIS — N2581 Secondary hyperparathyroidism of renal origin: Secondary | ICD-10-CM | POA: Diagnosis not present

## 2022-05-02 DIAGNOSIS — N186 End stage renal disease: Secondary | ICD-10-CM | POA: Diagnosis not present

## 2022-05-02 DIAGNOSIS — Z992 Dependence on renal dialysis: Secondary | ICD-10-CM | POA: Diagnosis not present

## 2022-07-02 ENCOUNTER — Ambulatory Visit (HOSPITAL_COMMUNITY): Admission: RE | Admit: 2022-07-02 | Payer: 59 | Source: Ambulatory Visit

## 2022-07-07 ENCOUNTER — Ambulatory Visit: Payer: 59 | Admitting: Urology

## 2022-08-24 ENCOUNTER — Ambulatory Visit: Payer: 59 | Admitting: Urology

## 2023-07-22 IMAGING — CT CT ABD-PELV W/ CM
2 of 4 series · 15 of 46 positions shown, 17 images · IV contrast (Omnipaque or Isovue)
Comparison: Abdominopelvic CT 05/29/2019.  Lumbar MRI 09/03/2020.

CLINICAL DATA: Nausea/vomiting, acute abdominal pain, acute,
nonlocalized. Complains of urinary tract infection. Dialysis
patient.

EXAM:
CT ABDOMEN AND PELVIS WITH CONTRAST
TECHNIQUE: Multidetector CT imaging of the abdomen and pelvis was performed
using the standard protocol following bolus administration of
intravenous contrast.
CONTRAST:  100mL OMNIPAQUE IOHEXOL 300 MG/ML  SOLN

[Series 2: axial st · axial · 0.63mm/px · z∈[-302,+98]mm · 12 of 90 slices shown, 14 images]
[im 5/90  soft-tissue]
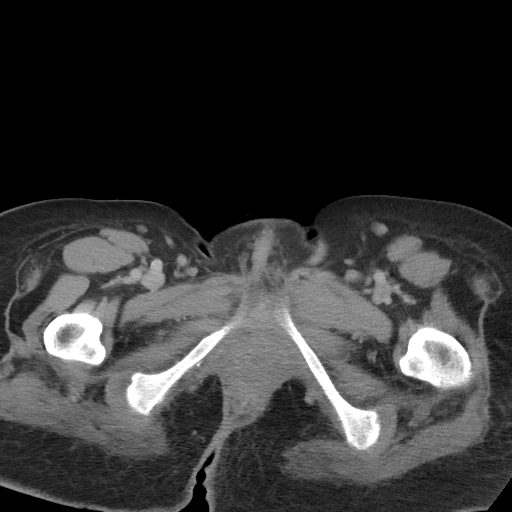
[im 5/90  bone]
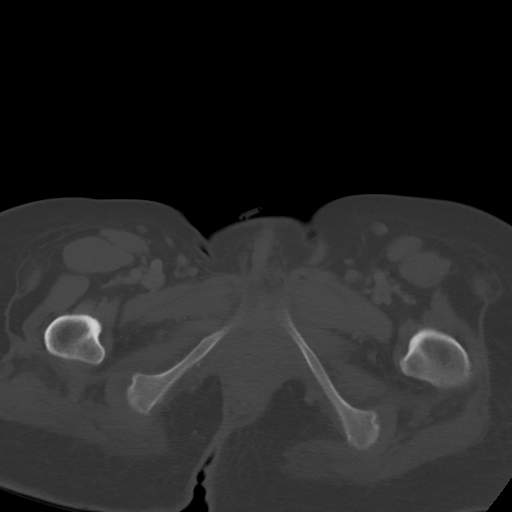
[im 13/90  soft-tissue]
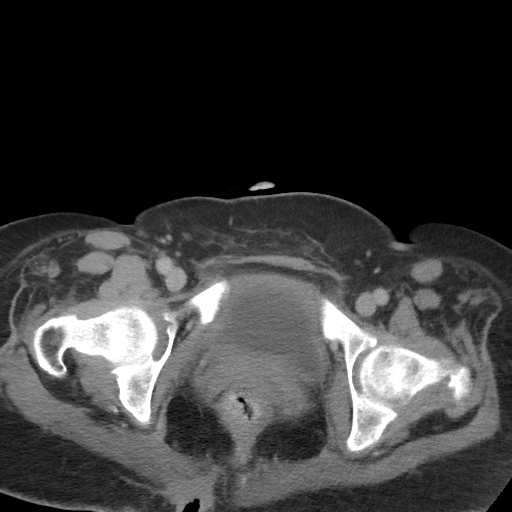
[im 22/90  soft-tissue]
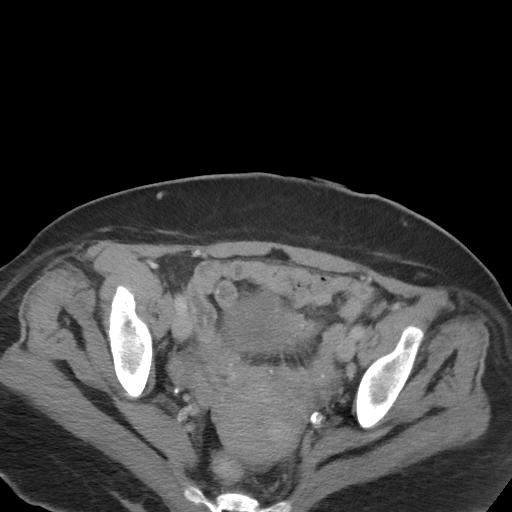
[im 26/90  soft-tissue]
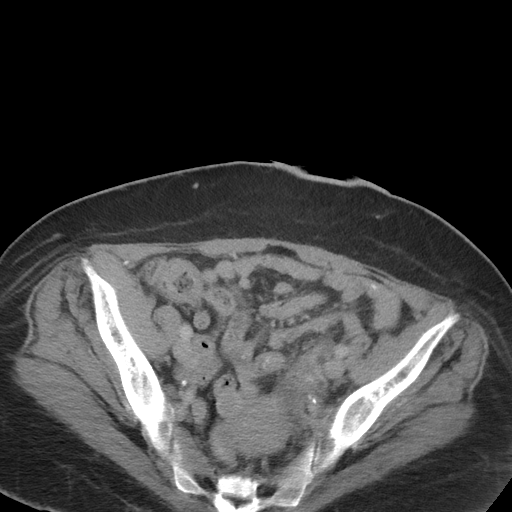
[im 34/90  soft-tissue]
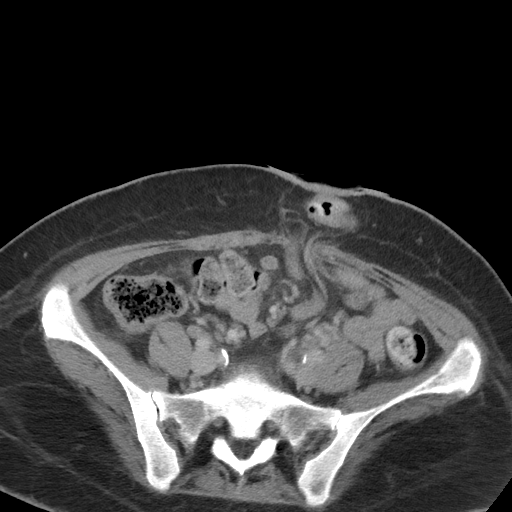
[im 43/90  soft-tissue]
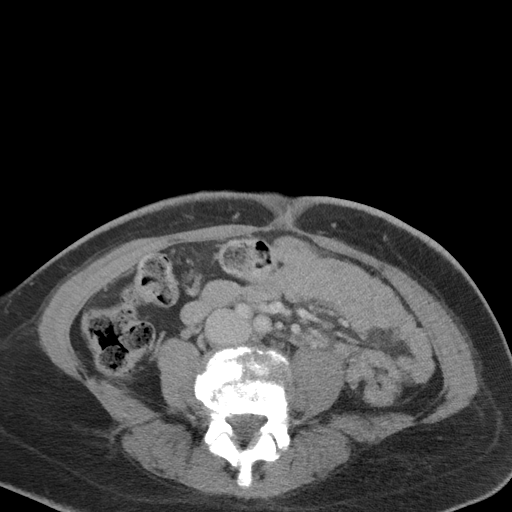
[im 47/90  soft-tissue]
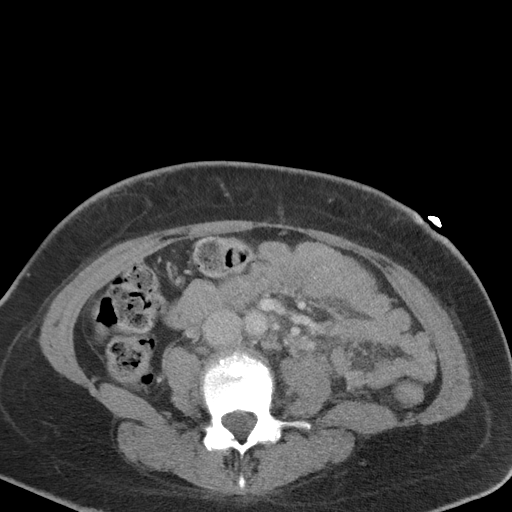
[im 56/90  soft-tissue]
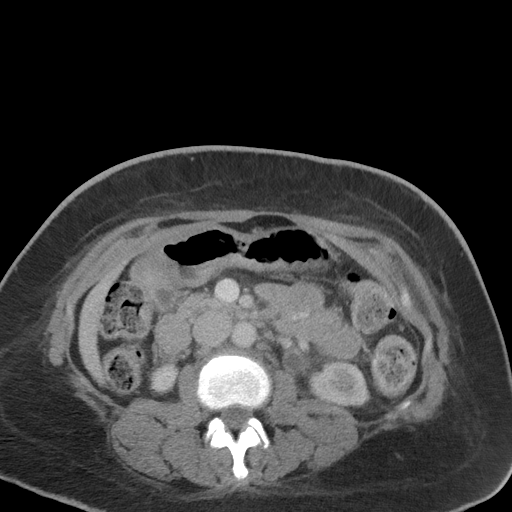
[im 64/90  soft-tissue]
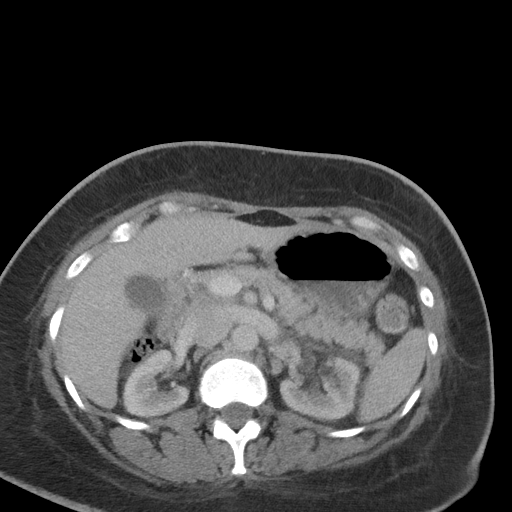
[im 64/90  bone]
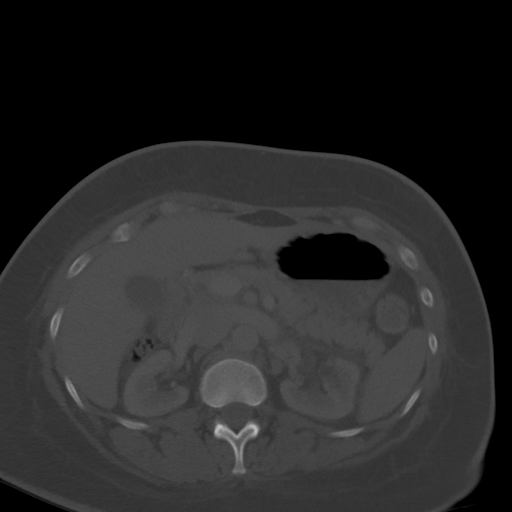
[im 68/90  soft-tissue]
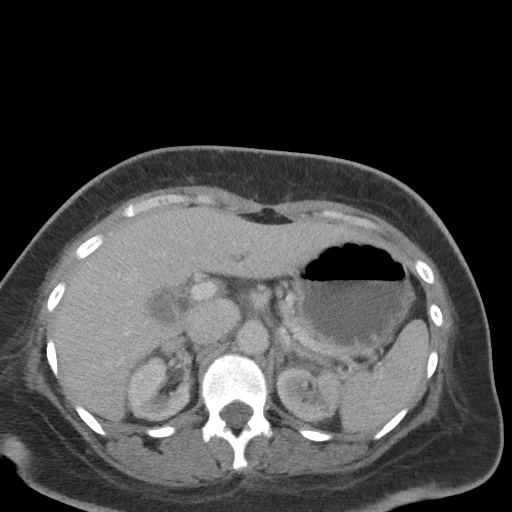
[im 77/90  soft-tissue]
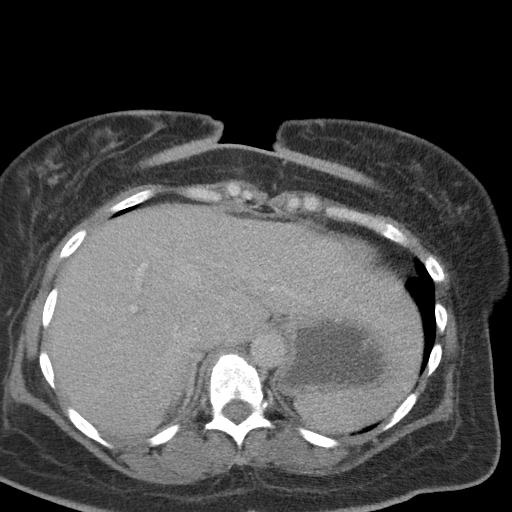
[im 85/90  soft-tissue]
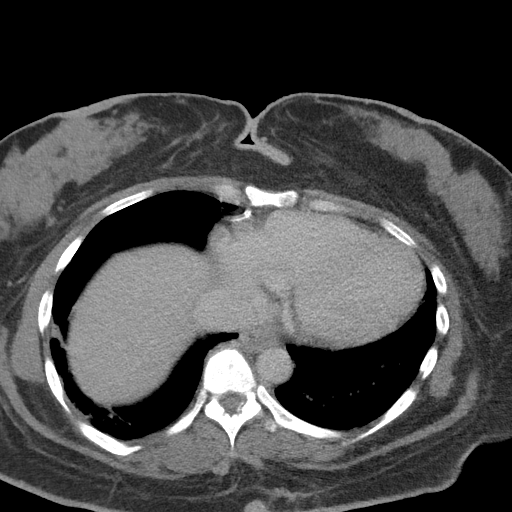

[Series 5: coronal st · coronal · 0.69mm/px · 3 of 77 slices shown]
[im 26/77  soft-tissue]
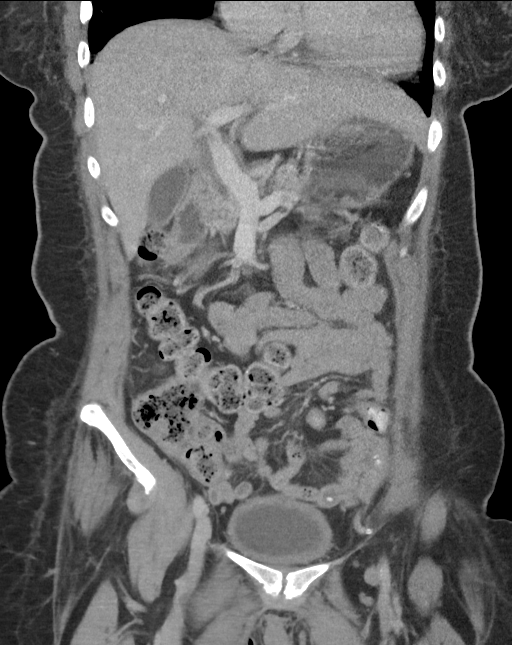
[im 34/77  soft-tissue]
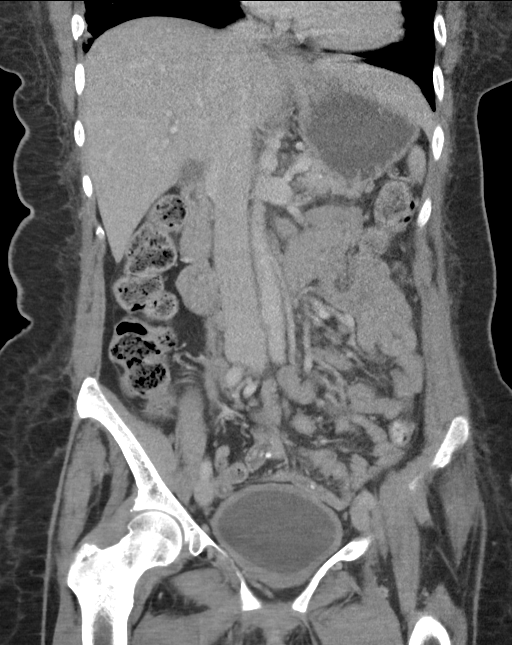
[im 43/77  soft-tissue]
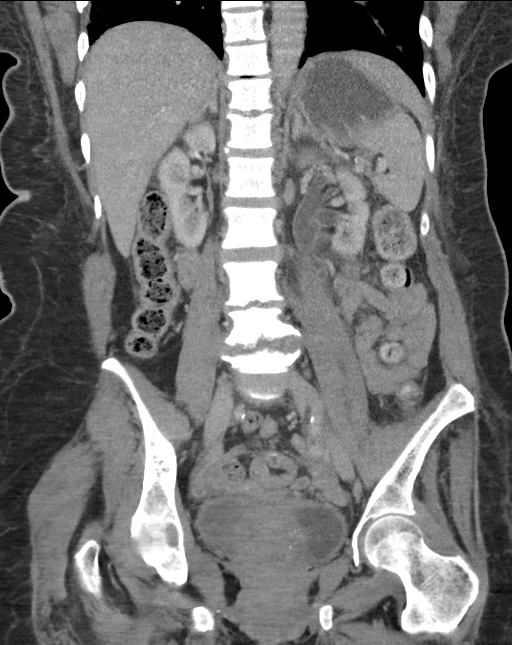

[15 of 46 positions shown; findings below may reference images not displayed]

FINDINGS: Lower chest: Stable mild scarring at the lung bases. No significant
pleural or pericardial effusion. Coronary artery atherosclerosis
noted.

Hepatobiliary: The liver is normal in density without suspicious
focal abnormality. No evidence of gallstones, gallbladder wall
thickening or biliary dilatation.

Pancreas: Unremarkable. No pancreatic ductal dilatation or
surrounding inflammatory changes.

Spleen: Normal in size without focal abnormality.

Adrenals/Urinary Tract: Both adrenal glands appear normal. There is
new asymmetric dilatation of the left renal pelvis and left ureter
with associated wall thickening and delayed contrast excretion. New
3 mm left pelvic calcification (image 70/2) is in close proximity to
the distal left ureter and could reflect a distal left ureteral
calculus. No other evidence of urinary tract calculus. Right kidney
appears normal. Mild diffuse bladder wall thickening.

Stomach/Bowel: No enteric contrast administered. The stomach appears
unremarkable for its degree of distension. No evidence of bowel wall
thickening, distention or surrounding inflammatory change. The
appendix is not clearly visualized, although there is no pericecal
inflammation. Diverting sigmoid colostomy appears unchanged.

Vascular/Lymphatic: There are no enlarged abdominal or pelvic lymph
nodes. No acute vascular findings. Mild iliac atherosclerosis.

Reproductive: The uterus and ovaries appear unchanged. No adnexal
mass.

Other: No ascites, free air or abdominal wall hernia.

Musculoskeletal: Chronic sacral decubitus ulcer along the right
aspect of the distal sacrum and coccyx again noted. The underlying
sacrum appears stable without progressive bone destruction. Chronic
changes of bilateral sacroiliitis. Chronic endplate destruction and
sclerosis at L4-5, and to a lesser degree at L3-4 and L5-S1. No new
osseous findings are seen.
IMPRESSION: 1. New left-sided hydronephrosis with wall thickening and delayed
contrast excretion suspicious for distal left ureteral obstruction.
This may be on the basis a small distal ureteral calculus.
2. No other acute findings are identified. There is mild chronic
bladder wall thickening which appears similar to previous study.
3. Postsurgical changes from diverting sigmoid colostomy. No acute
bowel findings.
4. Stable chronic sacral decubitus ulcer, chronic bilateral
sacroiliitis and sequela of lumbar discitis and/or hemodialysis
related spondyloarthropathy. No apparent acute osseous findings.
